# Patient Record
Sex: Female | Born: 1940 | Race: White | Hispanic: No | Smoking: Never smoker
Health system: Southern US, Community
[De-identification: ages and names within clinical notes are randomized; demographics above are authoritative.]

## PROBLEM LIST (undated history)

## (undated) DIAGNOSIS — M549 Dorsalgia, unspecified: Secondary | ICD-10-CM

## (undated) DIAGNOSIS — R609 Edema, unspecified: Secondary | ICD-10-CM

## (undated) DIAGNOSIS — M1711 Unilateral primary osteoarthritis, right knee: Secondary | ICD-10-CM

## (undated) DIAGNOSIS — G629 Polyneuropathy, unspecified: Secondary | ICD-10-CM

## (undated) DIAGNOSIS — E039 Hypothyroidism, unspecified: Secondary | ICD-10-CM

## (undated) DIAGNOSIS — H409 Unspecified glaucoma: Secondary | ICD-10-CM

## (undated) DIAGNOSIS — M1990 Unspecified osteoarthritis, unspecified site: Secondary | ICD-10-CM

## (undated) DIAGNOSIS — F419 Anxiety disorder, unspecified: Secondary | ICD-10-CM

## (undated) DIAGNOSIS — T4145XA Adverse effect of unspecified anesthetic, initial encounter: Secondary | ICD-10-CM

## (undated) DIAGNOSIS — Z8709 Personal history of other diseases of the respiratory system: Secondary | ICD-10-CM

## (undated) DIAGNOSIS — R350 Frequency of micturition: Secondary | ICD-10-CM

## (undated) DIAGNOSIS — G8929 Other chronic pain: Secondary | ICD-10-CM

## (undated) DIAGNOSIS — M2540 Effusion, unspecified joint: Secondary | ICD-10-CM

## (undated) DIAGNOSIS — T8859XA Other complications of anesthesia, initial encounter: Secondary | ICD-10-CM

## (undated) DIAGNOSIS — E785 Hyperlipidemia, unspecified: Secondary | ICD-10-CM

## (undated) DIAGNOSIS — R3915 Urgency of urination: Secondary | ICD-10-CM

## (undated) DIAGNOSIS — M2550 Pain in unspecified joint: Secondary | ICD-10-CM

## (undated) DIAGNOSIS — S065X9A Traumatic subdural hemorrhage with loss of consciousness of unspecified duration, initial encounter: Secondary | ICD-10-CM

## (undated) DIAGNOSIS — K219 Gastro-esophageal reflux disease without esophagitis: Secondary | ICD-10-CM

## (undated) DIAGNOSIS — R600 Localized edema: Secondary | ICD-10-CM

## (undated) DIAGNOSIS — J189 Pneumonia, unspecified organism: Secondary | ICD-10-CM

## (undated) DIAGNOSIS — K8050 Calculus of bile duct without cholangitis or cholecystitis without obstruction: Secondary | ICD-10-CM

## (undated) DIAGNOSIS — Z8614 Personal history of Methicillin resistant Staphylococcus aureus infection: Secondary | ICD-10-CM

## (undated) DIAGNOSIS — I10 Essential (primary) hypertension: Secondary | ICD-10-CM

## (undated) DIAGNOSIS — S065XAA Traumatic subdural hemorrhage with loss of consciousness status unknown, initial encounter: Secondary | ICD-10-CM

## (undated) DIAGNOSIS — R7303 Prediabetes: Secondary | ICD-10-CM

## (undated) DIAGNOSIS — K851 Biliary acute pancreatitis without necrosis or infection: Secondary | ICD-10-CM

## (undated) HISTORY — DX: Unspecified osteoarthritis, unspecified site: M19.90

## (undated) HISTORY — PX: ABDOMINAL HYSTERECTOMY: SHX81

## (undated) HISTORY — DX: Essential (primary) hypertension: I10

## (undated) HISTORY — PX: COLONOSCOPY: SHX174

## (undated) HISTORY — PX: TOTAL HIP ARTHROPLASTY: SHX124

## (undated) HISTORY — DX: Calculus of bile duct without cholangitis or cholecystitis without obstruction: K80.50

## (undated) HISTORY — DX: Traumatic subdural hemorrhage with loss of consciousness of unspecified duration, initial encounter: S06.5X9A

## (undated) HISTORY — DX: Traumatic subdural hemorrhage with loss of consciousness status unknown, initial encounter: S06.5XAA

---

## 2002-03-25 DIAGNOSIS — Z8614 Personal history of Methicillin resistant Staphylococcus aureus infection: Secondary | ICD-10-CM

## 2002-03-25 HISTORY — DX: Personal history of Methicillin resistant Staphylococcus aureus infection: Z86.14

## 2014-09-23 DIAGNOSIS — Z96651 Presence of right artificial knee joint: Secondary | ICD-10-CM | POA: Insufficient documentation

## 2014-12-28 LAB — BASIC METABOLIC PANEL
BUN: 28 mg/dL — AB (ref 4–21)
Creatinine: 1.1 mg/dL (ref 0.5–1.1)
Glucose: 100 mg/dL
Potassium: 5.1 mmol/L (ref 3.4–5.3)
Sodium: 137 mmol/L (ref 137–147)

## 2014-12-28 LAB — CBC AND DIFFERENTIAL
HCT: 40 % (ref 36–46)
Hemoglobin: 12.5 g/dL (ref 12.0–16.0)
Neutrophils Absolute: 4 /uL
Platelets: 322 10*3/uL (ref 150–399)
WBC: 6.3 10^3/mL

## 2014-12-28 LAB — HEPATIC FUNCTION PANEL
ALT: 27 U/L (ref 7–35)
AST: 30 U/L (ref 13–35)
Alkaline Phosphatase: 90 U/L (ref 25–125)
Bilirubin, Total: 0.5 mg/dL

## 2014-12-28 LAB — HEMOGLOBIN A1C: Hgb A1c MFr Bld: 6.8 % — AB (ref 4.0–6.0)

## 2014-12-28 LAB — TSH: TSH: 1.8 u[IU]/mL (ref 0.41–5.90)

## 2015-01-23 ENCOUNTER — Encounter: Payer: Self-pay | Admitting: Internal Medicine

## 2015-01-23 ENCOUNTER — Ambulatory Visit (INDEPENDENT_AMBULATORY_CARE_PROVIDER_SITE_OTHER): Payer: Medicare Other | Admitting: Internal Medicine

## 2015-01-23 ENCOUNTER — Encounter (INDEPENDENT_AMBULATORY_CARE_PROVIDER_SITE_OTHER): Payer: Self-pay

## 2015-01-23 VITALS — BP 98/52 | HR 97 | Temp 98.0°F | Resp 12 | Ht 66.5 in | Wt 297.5 lb

## 2015-01-23 DIAGNOSIS — E559 Vitamin D deficiency, unspecified: Secondary | ICD-10-CM

## 2015-01-23 DIAGNOSIS — Z23 Encounter for immunization: Secondary | ICD-10-CM | POA: Diagnosis not present

## 2015-01-23 DIAGNOSIS — N189 Chronic kidney disease, unspecified: Secondary | ICD-10-CM

## 2015-01-23 DIAGNOSIS — E1122 Type 2 diabetes mellitus with diabetic chronic kidney disease: Secondary | ICD-10-CM | POA: Diagnosis not present

## 2015-01-23 DIAGNOSIS — IMO0001 Reserved for inherently not codable concepts without codable children: Secondary | ICD-10-CM

## 2015-01-23 DIAGNOSIS — M1710 Unilateral primary osteoarthritis, unspecified knee: Secondary | ICD-10-CM | POA: Diagnosis not present

## 2015-01-23 DIAGNOSIS — I129 Hypertensive chronic kidney disease with stage 1 through stage 4 chronic kidney disease, or unspecified chronic kidney disease: Secondary | ICD-10-CM

## 2015-01-23 MED ORDER — ERGOCALCIFEROL 1.25 MG (50000 UT) PO CAPS: 50000.0000 [IU] | ORAL_CAPSULE | ORAL | Status: DC

## 2015-01-23 NOTE — Patient Instructions (Addendum)
Your vitamin D is low, which can increase your risk of weak bones and fractures and interfere with your body's ability to absorb the calcium in your diet.   I am calling in a megadose of Vit D to take once weekly for a total of 3 months,  Then after you finish the weekly supplement, you should start taking an OTC  Vit D3 supplement 1000 units daily.    Check BP at home 3 times over the next week,  I will lower your dose of BP medication  if BP's are < 110/70   When you feel shaky and weak,  This could be a low blood sugar. You need to eat every 3 hours to stimulate your metabolism and prevent low blood sugars  Always combine protein with sugar to prevent low blood sugars.   You can drink glucerna once or twice daily   Pleasle return on or after Jan 8th for diabetes follow up and labs.  I will refill your oxcodone for January in late December

## 2015-01-23 NOTE — Progress Notes (Signed)
Subjective:  Patient ID: Bonnie NatterElizabeth Hinton, female    DOB: 1941-02-12  Age: 74 y.o. MRN: [id number]  CC: The primary encounter diagnosis was Encounter for immunization. 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  HPI Bonnie Hinton presents for establishment of care . She is a 74 yr old white female who has relocated to [place], [place].  Oct 7th to be closer to her daughter, who is  a physical therapist .  She was referred by Josephine IgoKeri Dunckel.  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  She is awaiting right knee which has been postponed to allow her to lose weight.  Thus far she has lost 60 lbs in the past 12 months and goal is  BMI < 40 per  Dr Wyline MoodWeiner.   She enrolled in th  Physician directed weight loss  But the program was too difficult and the caloric restriction to 900 calories daily was causing memory loss. ased memory issues  dfaughtter is supervising. Low carb high  protein diet.  Drinking water when home.   History of DM:  She has been prescribed victoza  For "pre diabetes."  But her most recent  a1c was 6.8 Has had hypoglycemic events per description,  Does not have a glucometer.  statin intolerant.  Taking zetia   Vit D very low .  taking 1000 IUS daily.    was hospitalized in April for hypertensive crisis 230/180.  nost sure which meds she was on prior to hospitalization .  Checks BP at home.  Has been low at home.  Currently taking prinizide .  takes celebrex once or twice daily  For joint pain   Previous MD prescribed gabapentin and tramadol for breakthrough pain, which caused loss of muscle tone and weakness so stopped.   Oxycodone and oxycontin "I have enough for two months" Using stool softeners and miralax daily to prevent constipation.    History Bonnie Hinton has a past medical  history of Arthritis; Diabetes mellitus without complication (HCC); Hypertension; and Thyroid disease.   She has past surgical history that includes Joint replacement and Abdominal hysterectomy.   Her family history includes Cancer in her brother and father; Diabetes in her maternal aunt.She reports that she has never smoked. She has never used smokeless tobacco. She reports that she does not drink alcohol or use illicit drugs.  No outpatient prescriptions prior to visit.   No facility-administered medications prior to visit.    Review of Systems:  Patient denies headache, fevers, malaise, unintentional weight loss, skin rash, eye pain, sinus congestion and sinus pain, sore throat, dysphagia,  hemoptysis , cough, dyspnea, wheezing, chest pain, palpitations, orthopnea, edema, abdominal pain, nausea, melena, diarrhea, constipation, flank pain, dysuria, hematuria, urinary  Frequency, nocturia, numbness, tingling, seizures,  Focal weakness, Loss of consciousness,  Tremor, insomnia, depression, anxiety, and suicidal ideation.     Objective:  BP 98/52 mmHg  Pulse 97  Temp(Src) 98 F (36.7 C) (Oral)  Resp 12  Ht 5' 6.5" (1.689 m)  Wt 297 lb 8 oz (134.945 kg)  BMI 47.30 kg/m2  SpO2 97%  Physical Exam:  General appearance: morbidly obese, alert , cooperative and appears stated age Ears: normal TM's and external ear canals both ears Throat: lips, mucosa, and tongue normal; teeth and gums normal Neck: no  adenopathy, no carotid bruit, supple, symmetrical, trachea midline and thyroid not enlarged, symmetric, no tenderness/mass/nodules Back: symmetric, no curvature. ROM normal. No CVA tenderness. Lungs: clear to auscultation bilaterally Heart: regular rate and rhythm, S1, S2 normal, no murmur, click, rub or gallop Abdomen: soft, non-tender; bowel sounds normal; no masses,  no organomegaly Pulses: 2+ and symmetric Skin: Skin color, texture, turgor normal. No rashes or lesions Lymph nodes:  Cervical, supraclavicular, and axillary nodes normal.  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 She is taking and ACE Inhibitor,  Zetia and a low dose aspirin.  Continue Victoza, return in 3 months.  Advised to add protein shakes in the am to prevent hypoglycemia.   No results found for: HGBA1C       Relevant Medications   aspirin EC 81 MG tablet   ezetimibe (ZETIA) 10 MG tablet   furosemide (LASIX) 20 MG tablet   Liraglutide 18 MG/3ML SOPN   lisinopril-hydrochlorothiazide (PRINZIDE,ZESTORETIC) 20-25 MG tablet   Morbid obesity (HCC)    I have congratulated her in reduction of   BMI and encouraged  Continued weight loss with goal of 10% of body weigh over the next 6 months using a low glycemic index diet and regular exercise a minimum of 5 days per week.        Relevant Medications   Liraglutide 18 MG/3ML SOPN   Vitamin D deficiency    Megadose prescribed for level < 12       Other Visit Diagnoses    Encounter for immunization    -  Primary     A total of 45 minutes was spent with patient more than half of which was spent in counseling patient on the above mentioned issues , reviewing and explaining recent labs and imaging studies done, and coordination of care.   I am having Bonnie Hinton start on ergocalciferol. I am also having her maintain her aspirin EC, b complex vitamins, celecoxib, cycloSPORINE, diclofenac sodium, estradiol, ezetimibe, fluticasone, furosemide, levothyroxine, Liraglutide, Omega-3, oxyCODONE, oxyCODONE-acetaminophen, pantoprazole, lisinopril-hydrochlorothiazide, and tolterodine.  Meds ordered this encounter    Medications  . aspirin EC 81 MG tablet    Sig: Take 81 mg by mouth.  Marland Kitchen b complex vitamins capsule    Sig: Take by mouth.  . celecoxib (CELEBREX) 200 MG capsule    Sig: Take 200 mg by mouth 2 (two) times daily.   . cycloSPORINE (RESTASIS) 0.05 % ophthalmic emulsion    Sig: 1 drop Two (2) times a day.  . diclofenac sodium (VOLTAREN) 1 % GEL    Sig: Apply 2 g topically 4 (four) times daily.   Marland Kitchen estradiol (ESTRACE) 1 MG tablet    Sig: Take 1 mg by mouth daily.   Marland Kitchen ezetimibe (ZETIA) 10 MG tablet    Sig: Take 10 mg by mouth.  . fluticasone (FLONASE) 50 MCG/ACT nasal spray    Sig: 1 spray by Each Nare route daily.  . furosemide (LASIX) 20 MG tablet    Sig: Take 20 mg by mouth daily as needed.   Marland Kitchen levothyroxine (SYNTHROID, LEVOTHROID) 200 MCG tablet  Sig: Take by mouth daily before breakfast.   . Liraglutide 18 MG/3ML SOPN    Sig: Inject 0.6 mg into the skin daily.   . Omega-3 1000 MG CAPS    Sig: Take 1 capsule by mouth daily.   Marland Kitchen oxyCODONE 30 MG 12 hr tablet    Sig: Take 1 tablet by mouth daily.   Marland Kitchen oxyCODONE-acetaminophen (PERCOCET) 10-325 MG tablet    Sig: Take 1 tablet by mouth every 4 (four) hours as needed.   . pantoprazole (PROTONIX) 40 MG tablet    Sig: Take 40 mg by mouth.  Marland Kitchen lisinopril-hydrochlorothiazide (PRINZIDE,ZESTORETIC) 20-25 MG tablet    Sig: Take 1 tablet by mouth daily.  Marland Kitchen tolterodine (DETROL LA) 4 MG 24 hr capsule    Sig: Take 4 mg by mouth daily.  . ergocalciferol (DRISDOL) 50000 UNITS capsule    Sig: Take 1 capsule (50,000 Units total) by mouth once a week.    Dispense:  12 capsule    Refill:  0    With a meal    There are no discontinued medications.  Follow-up: Return in about 3 months (around 04/25/2015) for follow up diabetes.   Sherlene Shams, MD

## 2015-01-23 NOTE — Progress Notes (Signed)
Pre-visit discussion using our clinic review tool. No additional management support is needed unless otherwise documented below in the visit note.  

## 2015-01-24 DIAGNOSIS — E1121 Type 2 diabetes mellitus with diabetic nephropathy: Secondary | ICD-10-CM | POA: Insufficient documentation

## 2015-01-24 DIAGNOSIS — E559 Vitamin D deficiency, unspecified: Secondary | ICD-10-CM | POA: Insufficient documentation

## 2015-01-24 NOTE — Assessment & Plan Note (Signed)
Megadose prescribed for level < 12

## 2015-01-24 NOTE — Assessment & Plan Note (Addendum)
review of recent outside labs confirms diagnosis with a1c of 6.8 . She is taking and ACE Inhibitor,  Zetia and a low dose aspirin.  Continue Victoza, return in 3 months.  Advised to add protein shakes in the am to prevent hypoglycemia.   No results found for: HGBA1C

## 2015-01-24 NOTE — Assessment & Plan Note (Signed)
I have congratulated her in reduction of   BMI and encouraged  Continued weight loss with goal of 10% of body weigh over the next 6 months using a low glycemic index diet and regular exercise a minimum of 5 days per week.    

## 2015-01-25 ENCOUNTER — Encounter: Payer: Self-pay | Admitting: Internal Medicine

## 2015-03-09 ENCOUNTER — Telehealth: Payer: Self-pay | Admitting: Internal Medicine

## 2015-03-09 ENCOUNTER — Other Ambulatory Visit: Payer: Self-pay | Admitting: Internal Medicine

## 2015-03-09 NOTE — Telephone Encounter (Signed)
Refill sent to Dr.Tullo to approve.

## 2015-03-09 NOTE — Telephone Encounter (Signed)
Pt would like refills as soon possible foxyCODONE 30 MG 12 hr tablet and  oxyCODONE-acetaminophen (PERCOCET) 10-325 MG tablet. Please contact to pt about this.  Thank you/

## 2015-03-09 NOTE — Telephone Encounter (Signed)
Patient would like a refill as soon as possible.

## 2015-03-09 NOTE — Telephone Encounter (Signed)
Please advise 

## 2015-03-10 MED ORDER — OXYCODONE HCL ER 30 MG PO T12A: 1.0000 | EXTENDED_RELEASE_TABLET | Freq: Every day | ORAL | Status: DC

## 2015-03-10 MED ORDER — OXYCODONE-ACETAMINOPHEN 10-325 MG PO TABS: 1.0000 | ORAL_TABLET | ORAL | Status: DC | PRN

## 2015-03-10 NOTE — Telephone Encounter (Signed)
Patient notified that Rx was approved and ready for pickup.

## 2015-03-10 NOTE — Telephone Encounter (Signed)
Ok to refill,  printed rx 's

## 2015-03-13 ENCOUNTER — Telehealth: Payer: Self-pay | Admitting: Internal Medicine

## 2015-03-13 MED ORDER — OXYCODONE-ACETAMINOPHEN 10-325 MG PO TABS: 1.0000 | ORAL_TABLET | ORAL | Status: DC | PRN

## 2015-03-13 NOTE — Telephone Encounter (Signed)
Attempted to contact patient (phone line kept ringing). Will try again later

## 2015-03-13 NOTE — Telephone Encounter (Signed)
Correction she can fill the new rx for #120 on the 23rd if she had the other one filled on the 16th

## 2015-03-13 NOTE — Telephone Encounter (Signed)
Pt called in on the afternoon of 12/19 to cancel request. She had another doctor sign papers.  Thank you

## 2015-03-13 NOTE — Telephone Encounter (Signed)
New rx written. She is a new patient to me. I was not aware of how often she was using the short acting percocet .  If she has already filled the previous one,  She will need to use it for the first week and can pick the #120 rx up non Dec 26th

## 2015-03-13 NOTE — Telephone Encounter (Signed)
Script reads every QID PRN  Dispense 30 pleae advise should have been 120?

## 2015-03-13 NOTE — Telephone Encounter (Signed)
Pt dropped off paper work for Dr. Darrick Huntsmanullo to sign for pt's mail box to be moved to front door.  Thank you

## 2015-03-13 NOTE — Telephone Encounter (Signed)
Pt called, she picked up her two prescriptions.  The oxyCODONE-acetaminophen (PERCOCET) 10-325 MG tablet is incorrect. Please call pt.  Thank you

## 2015-03-13 NOTE — Telephone Encounter (Signed)
Patient called stating she only received 30 tablets of her Percocet and she was needing 130. Please advise.

## 2015-03-14 NOTE — Telephone Encounter (Signed)
Pt called back to check on the prescription oxyCODONE-acetaminophen (PERCOCET) 10-325 MG tablet. Pt would like to pick it up today. Pt daughter has to be back at work at 1:00p. Thank you!

## 2015-03-14 NOTE — Telephone Encounter (Signed)
Please call pt on [phone]. Thank you!

## 2015-03-14 NOTE — Telephone Encounter (Signed)
Spoke with the patient, she will have her daughter come to pick up the prescription today before 1pm.  Verbalized understanding that it can't be filled until the 23rd.

## 2015-04-03 ENCOUNTER — Other Ambulatory Visit: Payer: Medicare Other

## 2015-04-11 ENCOUNTER — Telehealth: Payer: Self-pay | Admitting: *Deleted

## 2015-04-11 ENCOUNTER — Other Ambulatory Visit (INDEPENDENT_AMBULATORY_CARE_PROVIDER_SITE_OTHER): Payer: Medicare Other

## 2015-04-11 ENCOUNTER — Telehealth: Payer: Self-pay | Admitting: Internal Medicine

## 2015-04-11 DIAGNOSIS — I129 Hypertensive chronic kidney disease with stage 1 through stage 4 chronic kidney disease, or unspecified chronic kidney disease: Principal | ICD-10-CM

## 2015-04-11 DIAGNOSIS — E1122 Type 2 diabetes mellitus with diabetic chronic kidney disease: Secondary | ICD-10-CM | POA: Diagnosis not present

## 2015-04-11 DIAGNOSIS — E034 Atrophy of thyroid (acquired): Secondary | ICD-10-CM | POA: Diagnosis not present

## 2015-04-11 DIAGNOSIS — N189 Chronic kidney disease, unspecified: Secondary | ICD-10-CM

## 2015-04-11 DIAGNOSIS — E559 Vitamin D deficiency, unspecified: Secondary | ICD-10-CM | POA: Diagnosis not present

## 2015-04-11 DIAGNOSIS — IMO0001 Reserved for inherently not codable concepts without codable children: Secondary | ICD-10-CM

## 2015-04-11 DIAGNOSIS — E038 Other specified hypothyroidism: Secondary | ICD-10-CM | POA: Diagnosis not present

## 2015-04-11 DIAGNOSIS — E875 Hyperkalemia: Secondary | ICD-10-CM

## 2015-04-11 LAB — COMPREHENSIVE METABOLIC PANEL
ALT: 83 U/L — ABNORMAL HIGH (ref 0–35)
AST: 24 U/L (ref 0–37)
Albumin: 4.1 g/dL (ref 3.5–5.2)
Alkaline Phosphatase: 251 U/L — ABNORMAL HIGH (ref 39–117)
BUN: 42 mg/dL — ABNORMAL HIGH (ref 6–23)
CO2: 28 mEq/L (ref 19–32)
Calcium: 9.5 mg/dL (ref 8.4–10.5)
Chloride: 103 mEq/L (ref 96–112)
Creatinine, Ser: 1.29 mg/dL — ABNORMAL HIGH (ref 0.40–1.20)
GFR: 42.91 mL/min — ABNORMAL LOW (ref >60.00)
Glucose, Bld: 108 mg/dL — ABNORMAL HIGH (ref 70–99)
Potassium: 5.5 mEq/L — ABNORMAL HIGH (ref 3.5–5.1)
Sodium: 139 mEq/L (ref 135–145)
Total Bilirubin: 0.5 mg/dL (ref 0.2–1.2)
Total Protein: 7.7 g/dL (ref 6.0–8.3)

## 2015-04-11 LAB — TSH: TSH: 1.53 u[IU]/mL (ref 0.35–4.50)

## 2015-04-11 LAB — LIPID PANEL
Cholesterol: 133 mg/dL (ref 0–200)
HDL: 44.4 mg/dL (ref >39.00)
LDL Cholesterol: 54 mg/dL (ref 0–99)
NonHDL: 88.8
Total CHOL/HDL Ratio: 3
Triglycerides: 174 mg/dL — ABNORMAL HIGH (ref 0.0–149.0)
VLDL: 34.8 mg/dL (ref 0.0–40.0)

## 2015-04-11 LAB — HEMOGLOBIN A1C: Hgb A1c MFr Bld: 6 % (ref 4.6–6.5)

## 2015-04-11 LAB — VITAMIN D 25 HYDROXY (VIT D DEFICIENCY, FRACTURES): VITD: 20.58 ng/mL — ABNORMAL LOW (ref 30.00–100.00)

## 2015-04-11 NOTE — Telephone Encounter (Signed)
Labs and dx?  

## 2015-04-11 NOTE — Telephone Encounter (Signed)
Pt came into office and dropped off a note with a envelope. She is requesting a written letter from Dr. Darrick Huntsman saying that her mail box needs to be left on house.

## 2015-04-12 NOTE — Telephone Encounter (Signed)
Bonnie Hinton put the information that the pt left for you to fill out is in the red folder./tvw

## 2015-04-12 NOTE — Telephone Encounter (Signed)
Where is the note?

## 2015-04-13 NOTE — Telephone Encounter (Signed)
Letter has been drafted ,  Please print if not on printer already

## 2015-04-13 NOTE — Telephone Encounter (Signed)
Letter mailed to patient as requested.

## 2015-04-13 NOTE — Telephone Encounter (Signed)
Placed in quick sign. 

## 2015-04-15 DIAGNOSIS — E875 Hyperkalemia: Secondary | ICD-10-CM | POA: Insufficient documentation

## 2015-04-15 MED ORDER — ERGOCALCIFEROL 1.25 MG (50000 UT) PO CAPS: 50000.0000 [IU] | ORAL_CAPSULE | ORAL | Status: DC

## 2015-04-15 NOTE — Addendum Note (Signed)
Addended by: Sherlene Shams on: 04/15/2015 03:56 PM   Modules accepted: Orders

## 2015-04-18 ENCOUNTER — Other Ambulatory Visit (INDEPENDENT_AMBULATORY_CARE_PROVIDER_SITE_OTHER): Payer: Medicare Other

## 2015-04-18 ENCOUNTER — Telehealth: Payer: Self-pay | Admitting: Internal Medicine

## 2015-04-18 DIAGNOSIS — E875 Hyperkalemia: Secondary | ICD-10-CM

## 2015-04-18 LAB — RENAL FUNCTION PANEL
Albumin: 4.1 g/dL (ref 3.5–5.2)
BUN: 41 mg/dL — ABNORMAL HIGH (ref 6–23)
CO2: 26 mEq/L (ref 19–32)
Calcium: 9.1 mg/dL (ref 8.4–10.5)
Chloride: 99 mEq/L (ref 96–112)
Creatinine, Ser: 1.33 mg/dL — ABNORMAL HIGH (ref 0.40–1.20)
GFR: 41.42 mL/min — ABNORMAL LOW (ref >60.00)
Glucose, Bld: 112 mg/dL — ABNORMAL HIGH (ref 70–99)
Phosphorus: 3.9 mg/dL (ref 2.3–4.6)
Potassium: 4.6 mEq/L (ref 3.5–5.1)
Sodium: 135 mEq/L (ref 135–145)

## 2015-04-18 NOTE — Telephone Encounter (Signed)
Ok   Done  Thank you

## 2015-04-18 NOTE — Telephone Encounter (Signed)
I fMS. Avakian returns call please transfer to triage or me so lab reults can be read to patient.

## 2015-04-18 NOTE — Telephone Encounter (Signed)
Pt daughter called returning your call. Daughter states to call pt. Call pt @ [phone]. Thank you!

## 2015-04-25 ENCOUNTER — Ambulatory Visit (INDEPENDENT_AMBULATORY_CARE_PROVIDER_SITE_OTHER): Payer: Medicare Other | Admitting: Internal Medicine

## 2015-04-25 ENCOUNTER — Encounter: Payer: Self-pay | Admitting: Internal Medicine

## 2015-04-25 VITALS — BP 124/60 | HR 94 | Temp 97.8°F | Resp 14 | Ht 67.0 in | Wt 292.0 lb

## 2015-04-25 DIAGNOSIS — IMO0001 Reserved for inherently not codable concepts without codable children: Secondary | ICD-10-CM

## 2015-04-25 DIAGNOSIS — N189 Chronic kidney disease, unspecified: Secondary | ICD-10-CM

## 2015-04-25 DIAGNOSIS — Z79899 Other long term (current) drug therapy: Secondary | ICD-10-CM | POA: Diagnosis not present

## 2015-04-25 DIAGNOSIS — I129 Hypertensive chronic kidney disease with stage 1 through stage 4 chronic kidney disease, or unspecified chronic kidney disease: Secondary | ICD-10-CM

## 2015-04-25 DIAGNOSIS — E119 Type 2 diabetes mellitus without complications: Secondary | ICD-10-CM | POA: Diagnosis not present

## 2015-04-25 DIAGNOSIS — E875 Hyperkalemia: Secondary | ICD-10-CM

## 2015-04-25 DIAGNOSIS — E1122 Type 2 diabetes mellitus with diabetic chronic kidney disease: Secondary | ICD-10-CM

## 2015-04-25 DIAGNOSIS — L853 Xerosis cutis: Secondary | ICD-10-CM

## 2015-04-25 DIAGNOSIS — M1711 Unilateral primary osteoarthritis, right knee: Secondary | ICD-10-CM

## 2015-04-25 DIAGNOSIS — Z23 Encounter for immunization: Secondary | ICD-10-CM

## 2015-04-25 DIAGNOSIS — R350 Frequency of micturition: Secondary | ICD-10-CM

## 2015-04-25 DIAGNOSIS — R3915 Urgency of urination: Secondary | ICD-10-CM

## 2015-04-25 LAB — POCT URINALYSIS DIPSTICK
Bilirubin, UA: NEGATIVE
Glucose, UA: NEGATIVE
Leukocytes, UA: NEGATIVE
Nitrite, UA: NEGATIVE
Protein, UA: NEGATIVE
Spec Grav, UA: 1.025
Urobilinogen, UA: 0.2
pH, UA: 5

## 2015-04-25 LAB — COMPREHENSIVE METABOLIC PANEL
ALT: 25 U/L (ref 0–35)
AST: 27 U/L (ref 0–37)
Albumin: 4.1 g/dL (ref 3.5–5.2)
Alkaline Phosphatase: 93 U/L (ref 39–117)
BUN: 34 mg/dL — ABNORMAL HIGH (ref 6–23)
CO2: 29 mEq/L (ref 19–32)
Calcium: 9.4 mg/dL (ref 8.4–10.5)
Chloride: 100 mEq/L (ref 96–112)
Creatinine, Ser: 1.1 mg/dL (ref 0.40–1.20)
GFR: 51.56 mL/min — ABNORMAL LOW (ref >60.00)
Glucose, Bld: 107 mg/dL — ABNORMAL HIGH (ref 70–99)
Potassium: 5.2 mEq/L — ABNORMAL HIGH (ref 3.5–5.1)
Sodium: 136 mEq/L (ref 135–145)
Total Bilirubin: 0.4 mg/dL (ref 0.2–1.2)
Total Protein: 7.5 g/dL (ref 6.0–8.3)

## 2015-04-25 LAB — URINALYSIS, ROUTINE W REFLEX MICROSCOPIC
Bilirubin Urine: NEGATIVE
Hgb urine dipstick: NEGATIVE
Ketones, ur: NEGATIVE
Leukocytes, UA: NEGATIVE
Nitrite: NEGATIVE
Specific Gravity, Urine: 1.025 (ref 1.000–1.030)
Total Protein, Urine: NEGATIVE
Urine Glucose: NEGATIVE
Urobilinogen, UA: 0.2 (ref 0.0–1.0)
pH: 5.5 (ref 5.0–8.0)

## 2015-04-25 LAB — MICROALBUMIN / CREATININE URINE RATIO
Creatinine,U: 138.4 mg/dL
Microalb Creat Ratio: 0.8 mg/g (ref 0.0–30.0)
Microalb, Ur: 1.1 mg/dL (ref 0.0–1.9)

## 2015-04-25 MED ORDER — OXYCODONE HCL ER 30 MG PO T12A: 1.0000 | EXTENDED_RELEASE_TABLET | Freq: Every day | ORAL | Status: DC

## 2015-04-25 MED ORDER — ROSUVASTATIN CALCIUM 20 MG PO TABS: 20.0000 mg | ORAL_TABLET | Freq: Every day | ORAL | Status: DC

## 2015-04-25 MED ORDER — TOLTERODINE TARTRATE ER 4 MG PO CP24: 4.0000 mg | ORAL_CAPSULE | Freq: Two times a day (BID) | ORAL | Status: DC

## 2015-04-25 MED ORDER — EZETIMIBE 10 MG PO TABS: 10.0000 mg | ORAL_TABLET | Freq: Every day | ORAL | Status: DC

## 2015-04-25 MED ORDER — LEVOTHYROXINE SODIUM 200 MCG PO TABS: 200.0000 ug | ORAL_TABLET | Freq: Every day | ORAL | Status: DC

## 2015-04-25 MED ORDER — OXYCODONE-ACETAMINOPHEN 10-325 MG PO TABS: 1.0000 | ORAL_TABLET | ORAL | Status: DC | PRN

## 2015-04-25 MED ORDER — PANTOPRAZOLE SODIUM 40 MG PO TBEC: 40.0000 mg | DELAYED_RELEASE_TABLET | Freq: Every day | ORAL | Status: DC

## 2015-04-25 MED ORDER — ESTRADIOL 1 MG PO TABS: 1.0000 mg | ORAL_TABLET | Freq: Every day | ORAL | Status: DC

## 2015-04-25 NOTE — Progress Notes (Signed)
Pre-visit discussion using our clinic review tool. No additional management support is needed unless otherwise documented below in the visit note.  

## 2015-04-25 NOTE — Progress Notes (Signed)
Subjective:  Patient ID: Bonnie Hinton, female    DOB: Sep 29, 1940  Age: 75 y.o. MRN: [id number]  CC: The primary encounter diagnosis was Long-term use of high-risk medication. Diagnoses of Frequency of urination, Diabetes mellitus without complication ([place]), Need for prophylactic vaccination against Streptococcus pneumoniae (pneumococcus), Type 2 DM with CKD and hypertension ([place]), Morbid obesity, unspecified obesity type ([place]), Primary osteoarthritis of right knee, Urinary urgency, Xerosis of skin, and Hyperkalemia were also pertinent to this visit.  HPI Tanyia Grabbe presents for follow up on severe DJD knees bilaterally , complicated by morbid obesity and type 2 DM with CKD and hypertension . She is having increased difficulty ambulating due to pain she lives alone , with a daughter in the near vicinity who is a physical therapist.  Has not had any  Falls since the move from [place].  Her last fall was due to orthostasis from medication  Previously prescribed.    Obesity:  She has lost 5 lbs since last visit. 70 lbs thus far   287 at home . dtr is picking the orthopedist once she has been able   3 month re fills needed on opioids.  Needs a new knee brace, bc it has become loose   Has been having urinary urge incontinence previously managed with Detrol LA 4 mg once daily,  Needs it to be changed to twice daily    Had a self described  allergic reaction itching all over lasting several days,  Daughter put her on Benadryl and this has helped.  No rash just itching to the point of bleeding.  Thinks it may be neurotic itching due to stress following the loss of husband 4 years ago and has never recovered.    Taking victoza for diabetes management.  Blood sugars  < 150 both fasting and post prandially.    Outpatient Prescriptions Prior to Visit  Medication Sig Dispense Refill  . aspirin EC 81 MG tablet Take 81 mg by mouth.    Marland Kitchen b complex vitamins capsule Take by mouth.    .  celecoxib (CELEBREX) 200 MG capsule Take 200 mg by mouth 2 (two) times daily.     . cycloSPORINE (RESTASIS) 0.05 % ophthalmic emulsion 1 drop Two (2) times a day.    . diclofenac sodium (VOLTAREN) 1 % GEL Apply 2 g topically 4 (four) times daily.     . ergocalciferol (DRISDOL) 50000 units capsule Take 1 capsule (50,000 Units total) by mouth once a week. 12 capsule 0  . fluticasone (FLONASE) 50 MCG/ACT nasal spray 1 spray by Each Nare route daily.    . furosemide (LASIX) 20 MG tablet Take 20 mg by mouth daily as needed.     . Liraglutide 18 MG/3ML SOPN Inject 0.6 mg into the skin daily.     Marland Kitchen lisinopril-hydrochlorothiazide (PRINZIDE,ZESTORETIC) 20-25 MG tablet Take 1 tablet by mouth daily.    . Omega-3 1000 MG CAPS Take 1 capsule by mouth daily.     Marland Kitchen estradiol (ESTRACE) 1 MG tablet Take 1 mg by mouth daily.     Marland Kitchen ezetimibe (ZETIA) 10 MG tablet Take 10 mg by mouth.    . levothyroxine (SYNTHROID, LEVOTHROID) 200 MCG tablet Take by mouth daily before breakfast.     . oxyCODONE 30 MG 12 hr tablet Take 1 tablet by mouth daily. 60 each 0  . oxyCODONE-acetaminophen (PERCOCET) 10-325 MG tablet Take 1 tablet by mouth every 4 (four) hours as needed. 120 tablet 0  . pantoprazole (PROTONIX) 40  MG tablet Take 40 mg by mouth.    . tolterodine (DETROL LA) 4 MG 24 hr capsule Take 4 mg by mouth 2 (two) times daily.     No facility-administered medications prior to visit.    Review of Systems;  Patient denies headache, fevers, malaise, unintentional weight loss, skin rash, eye pain, sinus congestion and sinus pain, sore throat, dysphagia,  hemoptysis , cough, dyspnea, wheezing, chest pain, palpitations, orthopnea, edema, abdominal pain, nausea, melena, diarrhea, constipation, flank pain, dysuria, hematuria, urinary  Frequency, nocturia, numbness, tingling, seizures,  Focal weakness, Loss of consciousness,  Tremor, insomnia, depression, anxiety, and suicidal ideation.      Objective:  BP 124/60 mmHg  Pulse  94  Temp(Src) 97.8 F (36.6 C) (Oral)  Resp 14  Ht _0  (1.702 m)  Wt 292 lb (132.45 kg)  BMI 45.72 kg/m2  SpO2 96%  BP Readings from Last 3 Encounters:  04/25/15 124/60  01/23/15 98/52    Wt Readings from Last 3 Encounters:  04/25/15 292 lb (132.45 kg)  01/23/15 297 lb 8 oz (134.945 kg)    General appearance: alert, cooperative and appears stated age Ears: normal TM's and external ear canals both ears Throat: lips, mucosa, and tongue normal; teeth and gums normal Neck: no adenopathy, no carotid bruit, supple, symmetrical, trachea midline and thyroid not enlarged, symmetric, no tenderness/mass/nodules Back: symmetric, no curvature. ROM normal. No CVA tenderness. Lungs: clear to auscultation bilaterally Heart: regular rate and rhythm, S1, S2 normal, no murmur, click, rub or gallop Abdomen: soft, non-tender; bowel sounds normal; no masses,  no organomegaly Pulses: 2+ and symmetric Skin: Skin color, texture, turgor normal. No rashes or lesions Lymph nodes: Cervical, supraclavicular, and axillary nodes normal.  Lab Results  Component Value Date   HGBA1C 6.0 04/11/2015   HGBA1C 6.8* 12/28/2014    Lab Results  Component Value Date   CREATININE 1.10 04/25/2015   CREATININE 1.33* 04/18/2015   CREATININE 1.29* 04/11/2015    Lab Results  Component Value Date   WBC 6.3 12/28/2014   HGB 12.5 12/28/2014   HCT 40 12/28/2014   PLT 322 12/28/2014   GLUCOSE 107* 04/25/2015   CHOL 133 04/11/2015   TRIG 174.0* 04/11/2015   HDL 44.40 04/11/2015   LDLCALC 54 04/11/2015   ALT 25 04/25/2015   AST 27 04/25/2015   NA 136 04/25/2015   K 5.2* 04/25/2015   CL 100 04/25/2015   CREATININE 1.10 04/25/2015   BUN 34* 04/25/2015   CO2 29 04/25/2015   TSH 1.53 04/11/2015   HGBA1C 6.0 04/11/2015   MICROALBUR 1.1 04/25/2015    Patient was never admitted.  Assessment & Plan:   Problem List Items Addressed This Visit    Arthritis of knee, degenerative    Managed with Celebrex and  opioids,  Suspending Celebrex given increase in cr.       Relevant Medications   oxyCODONE 30 MG 12 hr tablet   oxyCODONE-acetaminophen (PERCOCET) 10-325 MG tablet   oxyCODONE 30 MG 12 hr tablet   oxyCODONE-acetaminophen (PERCOCET) 10-325 MG tablet   oxyCODONE 30 MG 12 hr tablet   oxyCODONE-acetaminophen (PERCOCET) 10-325 MG tablet   Type 2 DM with CKD and hypertension ([place])    review of recent outside labs confirms diagnosis with a1c of 6.8 . She is taking an ACE Inhibitor,  Zetia and a low dose aspirin.  Continue Victoza, return in 3 months.  Advise to suspend celebrex for a month and repeat an assessment of renal function.  Lab Results  Component Value Date   HGBA1C 6.0 04/11/2015           Relevant Medications   ezetimibe (ZETIA) 10 MG tablet   rosuvastatin (CRESTOR) 20 MG tablet   Morbid obesity ([place])    I have congratulated her in reduction of   BMI and encouraged  Continued weight loss with goal of 10% of body weigh over the next 6 months using a low glycemic index diet and regular exercise a minimum of 5 days per week.        Urinary urgency    Not well controlled on Detrol 4 mg.  increase to 8 mg.       Xerosis of skin    Recommended emollient       Hyperkalemia    Mild, recurrent.  Suspending celebrex and repeat bmet one month       Other Visit Diagnoses    Long-term use of high-risk medication    -  Primary    Relevant Orders    Comp Met (CMET) (Completed)    Frequency of urination        Relevant Orders    POCT urinalysis dipstick (Completed)    Urinalysis, Routine w reflex microscopic (not at [facility]) (Completed)    Diabetes mellitus without complication ([facility])        Relevant Medications    rosuvastatin (CRESTOR) 20 MG tablet    Other Relevant Orders    Microalbumin / creatinine urine ratio (Completed)    Need for prophylactic vaccination against Streptococcus pneumoniae (pneumococcus)        Relevant Orders    Pneumococcal conjugate vaccine  13-valent (Completed)      A total of 40 minutes of face to face time was spent with patient more than half of which was spent in counselling and coordination of care   I have changed Ms. Moyano's estradiol, levothyroxine, ezetimibe, and pantoprazole. I am also having her start on rosuvastatin. Additionally, I am having her maintain her aspirin EC, b complex vitamins, celecoxib, cycloSPORINE, diclofenac sodium, fluticasone, furosemide, Liraglutide, Omega-3, lisinopril-hydrochlorothiazide, ergocalciferol, oxyCODONE, oxyCODONE-acetaminophen, oxyCODONE, oxyCODONE-acetaminophen, oxyCODONE, oxyCODONE-acetaminophen, and tolterodine.  Meds ordered this encounter  Medications  . DISCONTD: oxyCODONE 30 MG 12 hr tablet    Sig: Take 1 tablet by mouth daily.    Dispense:  60 each    Refill:  0  . DISCONTD: oxyCODONE-acetaminophen (PERCOCET) 10-325 MG tablet    Sig: Take 1 tablet by mouth every 4 (four) hours as needed.    Dispense:  180 tablet    Refill:  0  . DISCONTD: oxyCODONE 30 MG 12 hr tablet    Sig: Take 1 tablet by mouth daily.    Dispense:  60 each    Refill:  0    May refill on or after May 24 2015  . DISCONTD: oxyCODONE-acetaminophen (PERCOCET) 10-325 MG tablet    Sig: Take 1 tablet by mouth every 4 (four) hours as needed.    Dispense:  180 tablet    Refill:  0    May refill on or after May 24 2015  . oxyCODONE 30 MG 12 hr tablet    Sig: Take 1 tablet by mouth daily.    Dispense:  60 each    Refill:  0  . oxyCODONE-acetaminophen (PERCOCET) 10-325 MG tablet    Sig: Take 1 tablet by mouth every 4 (four) hours as needed.    Dispense:  180 tablet    Refill:  0  .  oxyCODONE 30 MG 12 hr tablet    Sig: Take 1 tablet by mouth daily.    Dispense:  60 each    Refill:  0    May refill on or after June 22 2015  . oxyCODONE-acetaminophen (PERCOCET) 10-325 MG tablet    Sig: Take 1 tablet by mouth every 4 (four) hours as needed.    Dispense:  180 tablet    Refill:  0    May refill on  or after June 24 2015  . oxyCODONE 30 MG 12 hr tablet    Sig: Take 1 tablet by mouth daily.    Dispense:  60 each    Refill:  0    May refill on or after May 24 2015  . oxyCODONE-acetaminophen (PERCOCET) 10-325 MG tablet    Sig: Take 1 tablet by mouth every 4 (four) hours as needed.    Dispense:  180 tablet    Refill:  0    May refill on or after May 24 2015  . DISCONTD: tolterodine (DETROL LA) 4 MG 24 hr capsule    Sig: Take 1 capsule (4 mg total) by mouth 2 (two) times daily.    Dispense:  180 capsule    Refill:  1  . estradiol (ESTRACE) 1 MG tablet    Sig: Take 1 tablet (1 mg total) by mouth daily.    Dispense:  90 tablet    Refill:  3  . levothyroxine (SYNTHROID, LEVOTHROID) 200 MCG tablet    Sig: Take 1 tablet (200 mcg total) by mouth daily before breakfast.    Dispense:  90 tablet    Refill:  1  . ezetimibe (ZETIA) 10 MG tablet    Sig: Take 1 tablet (10 mg total) by mouth daily.    Dispense:  90 tablet    Refill:  3  . tolterodine (DETROL LA) 4 MG 24 hr capsule    Sig: Take 1 capsule (4 mg total) by mouth 2 (two) times daily.    Dispense:  180 capsule    Refill:  1  . pantoprazole (PROTONIX) 40 MG tablet    Sig: Take 1 tablet (40 mg total) by mouth daily.    Dispense:  90 tablet    Refill:  3  . rosuvastatin (CRESTOR) 20 MG tablet    Sig: Take 1 tablet (20 mg total) by mouth daily.    Dispense:  90 tablet    Refill:  1    Medications Discontinued During This Encounter  Medication Reason  . oxyCODONE 30 MG 12 hr tablet Reorder  . oxyCODONE-acetaminophen (PERCOCET) 10-325 MG tablet Reorder  . oxyCODONE 30 MG 12 hr tablet Reorder  . oxyCODONE-acetaminophen (PERCOCET) 10-325 MG tablet Reorder  . oxyCODONE 30 MG 12 hr tablet Reorder  . oxyCODONE-acetaminophen (PERCOCET) 10-325 MG tablet Reorder  . oxyCODONE 30 MG 12 hr tablet Reorder  . oxyCODONE-acetaminophen (PERCOCET) 10-325 MG tablet Reorder  . oxyCODONE 30 MG 12 hr tablet Reorder  . oxyCODONE-acetaminophen  (PERCOCET) 10-325 MG tablet Reorder  . tolterodine (DETROL LA) 4 MG 24 hr capsule Reorder  . estradiol (ESTRACE) 1 MG tablet Reorder  . levothyroxine (SYNTHROID, LEVOTHROID) 200 MCG tablet Reorder  . ezetimibe (ZETIA) 10 MG tablet Reorder  . tolterodine (DETROL LA) 4 MG 24 hr capsule Reorder  . pantoprazole (PROTONIX) 40 MG tablet Reorder    Follow-up: Return in about 3 months (around 07/23/2015) for follow up diabetes.   Crecencio Mc, MD

## 2015-04-25 NOTE — Patient Instructions (Addendum)
You should try  Eucerin,  Cetaphil , or Aveeno and use daily or twice daily all over body  To prevent xerosis (extremely dry skin)   I am treating you for sinusitis/otitis which is ca complication from your viral infection due to  persistent sinus congestion.  flush your sinuses twice daily with Lloyd Huger med Sinus rinse  (do over the sink because if you do it right you will spit out globs of mucus)  Please stop the celebrex for a month and return for a repeat kidney test.

## 2015-04-26 DIAGNOSIS — L853 Xerosis cutis: Secondary | ICD-10-CM | POA: Insufficient documentation

## 2015-04-26 DIAGNOSIS — N3946 Mixed incontinence: Secondary | ICD-10-CM | POA: Insufficient documentation

## 2015-04-26 DIAGNOSIS — E875 Hyperkalemia: Secondary | ICD-10-CM | POA: Insufficient documentation

## 2015-04-26 NOTE — Assessment & Plan Note (Signed)
Not well controlled on Detrol 4 mg.  increase to 8 mg.

## 2015-04-26 NOTE — Assessment & Plan Note (Signed)
Mild, recurrent.  Suspending celebrex and repeat bmet one month

## 2015-04-26 NOTE — Assessment & Plan Note (Addendum)
review of recent outside labs confirms diagnosis with a1c of 6.8 . She is taking an ACE Inhibitor,  Zetia and a low dose aspirin.  Continue Victoza, return in 3 months.  Advise to suspend celebrex for a month and repeat an assessment of renal function.  Lab Results  Component Value Date   HGBA1C 6.0 04/11/2015

## 2015-04-26 NOTE — Assessment & Plan Note (Signed)
Managed with Celebrex and opioids,  Suspending Celebrex given increase in cr.

## 2015-04-26 NOTE — Assessment & Plan Note (Signed)
I have congratulated her in reduction of   BMI and encouraged  Continued weight loss with goal of 10% of body weigh over the next 6 months using a low glycemic index diet and regular exercise a minimum of 5 days per week.    

## 2015-04-26 NOTE — Assessment & Plan Note (Signed)
Recommended emollient

## 2015-05-02 ENCOUNTER — Other Ambulatory Visit: Payer: Self-pay

## 2015-05-02 ENCOUNTER — Other Ambulatory Visit (INDEPENDENT_AMBULATORY_CARE_PROVIDER_SITE_OTHER): Payer: Medicare Other

## 2015-05-02 ENCOUNTER — Telehealth: Payer: Self-pay | Admitting: Internal Medicine

## 2015-05-02 DIAGNOSIS — E875 Hyperkalemia: Secondary | ICD-10-CM | POA: Diagnosis not present

## 2015-05-02 LAB — BASIC METABOLIC PANEL
BUN: 34 mg/dL — ABNORMAL HIGH (ref 6–23)
CO2: 30 mEq/L (ref 19–32)
Calcium: 9.2 mg/dL (ref 8.4–10.5)
Chloride: 100 mEq/L (ref 96–112)
Creatinine, Ser: 1.24 mg/dL — ABNORMAL HIGH (ref 0.40–1.20)
GFR: 44.9 mL/min — ABNORMAL LOW (ref >60.00)
Glucose, Bld: 107 mg/dL — ABNORMAL HIGH (ref 70–99)
Potassium: 5 mEq/L (ref 3.5–5.1)
Sodium: 135 mEq/L (ref 135–145)

## 2015-05-02 MED ORDER — TOLTERODINE TARTRATE ER 4 MG PO CP24: 4.0000 mg | ORAL_CAPSULE | Freq: Two times a day (BID) | ORAL | Status: DC

## 2015-05-02 MED ORDER — AMLODIPINE BESYLATE 5 MG PO TABS
5.0000 mg | ORAL_TABLET | Freq: Every day | ORAL | Status: DC
Start: 2015-05-02 — End: 2015-12-04

## 2015-05-02 NOTE — Telephone Encounter (Signed)
Please advise as the pharmacy has sent a request that this dose exceeds the limit for detrol of  total daily. Thanks

## 2015-05-02 NOTE — Telephone Encounter (Signed)
Kidney cancer does not cause high potassium..  Please give her a low potassium diet handout and offer her an appointment if she is so distraught

## 2015-05-02 NOTE — Telephone Encounter (Signed)
Patient notified  and voiced understanding. Mailed copy of low potassium foods to patient.

## 2015-05-02 NOTE — Telephone Encounter (Signed)
I am suspending the lisinopril bc it may be contributing to the high potassium .   Will send amlodipine to mail order instead

## 2015-05-02 NOTE — Telephone Encounter (Signed)
Patient in office today for lab draw seen at front desk by nurse patient in tears concerned that the increase in potassium she is having could be due to cancer of the kidney which patient has a strong family HX of. Nurse tried to reassure patient may still be just transient or something as simple as eating to much potassium containing foods . Patient ask nurse to speak to MD of her concerns. Please advise.

## 2015-05-03 ENCOUNTER — Telehealth: Payer: Self-pay | Admitting: Internal Medicine

## 2015-05-03 ENCOUNTER — Other Ambulatory Visit: Payer: Self-pay | Admitting: Internal Medicine

## 2015-05-03 MED ORDER — TOLTERODINE TARTRATE ER 4 MG PO CP24: 4.0000 mg | ORAL_CAPSULE | Freq: Every day | ORAL | Status: DC

## 2015-05-03 NOTE — Telephone Encounter (Signed)
Pt is currently waiting on getting her Detrol approved, a message was sent to Dr. Darrick Huntsman to confirm the dosage and directions. Please advise, thanks

## 2015-05-03 NOTE — Telephone Encounter (Signed)
Please advise as I had sent a prior medication refill for patient's detrol and the pharmacy questioned as the dose exceeds the recommendations.  You approved and I wanted to double check that you are okay with that and I will call the pharmacy to make sure they know your okay with it.  thanks

## 2015-05-03 NOTE — Telephone Encounter (Signed)
I HAVE REASSESSED DOSE , patient requested an increase but it is not a good idea given her kidney clearance.  rx has been resent at 4 mg daily max dose

## 2015-05-03 NOTE — Telephone Encounter (Signed)
[facility]

## 2015-05-06 ENCOUNTER — Other Ambulatory Visit: Payer: Self-pay | Admitting: Internal Medicine

## 2015-05-06 DIAGNOSIS — R944 Abnormal results of kidney function studies: Secondary | ICD-10-CM

## 2015-05-11 ENCOUNTER — Telehealth: Payer: Self-pay | Admitting: Internal Medicine

## 2015-05-11 NOTE — Telephone Encounter (Signed)
PA for Estrace started on cover my meds.

## 2015-05-12 ENCOUNTER — Other Ambulatory Visit (INDEPENDENT_AMBULATORY_CARE_PROVIDER_SITE_OTHER): Payer: Medicare Other

## 2015-05-12 DIAGNOSIS — E875 Hyperkalemia: Secondary | ICD-10-CM

## 2015-05-12 DIAGNOSIS — IMO0001 Reserved for inherently not codable concepts without codable children: Secondary | ICD-10-CM

## 2015-05-12 DIAGNOSIS — R944 Abnormal results of kidney function studies: Secondary | ICD-10-CM

## 2015-05-12 DIAGNOSIS — I129 Hypertensive chronic kidney disease with stage 1 through stage 4 chronic kidney disease, or unspecified chronic kidney disease: Secondary | ICD-10-CM

## 2015-05-12 DIAGNOSIS — E1122 Type 2 diabetes mellitus with diabetic chronic kidney disease: Secondary | ICD-10-CM

## 2015-05-12 LAB — BASIC METABOLIC PANEL
BUN: 22 mg/dL (ref 6–23)
CO2: 32 mEq/L (ref 19–32)
Calcium: 8.7 mg/dL (ref 8.4–10.5)
Chloride: 103 mEq/L (ref 96–112)
Creatinine, Ser: 0.97 mg/dL (ref 0.40–1.20)
GFR: 59.61 mL/min — ABNORMAL LOW (ref >60.00)
Glucose, Bld: 101 mg/dL — ABNORMAL HIGH (ref 70–99)
Potassium: 4.4 mEq/L (ref 3.5–5.1)
Sodium: 141 mEq/L (ref 135–145)

## 2015-05-12 NOTE — Progress Notes (Signed)
Patient here today for lab draw only. 

## 2015-05-14 NOTE — Assessment & Plan Note (Signed)
Resolved since stopping lisinopril

## 2015-05-14 NOTE — Addendum Note (Signed)
Addended by: Sherlene Shams on: 05/14/2015 08:26 AM   Modules accepted: Orders

## 2015-05-16 ENCOUNTER — Telehealth: Payer: Self-pay | Admitting: Internal Medicine

## 2015-05-16 NOTE — Telephone Encounter (Signed)
She can resume the detrol bid.  i will look into alternatives

## 2015-05-16 NOTE — Telephone Encounter (Signed)
Patient called and state since stopping Detrol BID, she is having trouble with incontinence during the day and night, is afraid to go to grocery store for urinating on her self.

## 2015-05-25 ENCOUNTER — Encounter: Payer: Self-pay | Admitting: Internal Medicine

## 2015-05-25 ENCOUNTER — Ambulatory Visit (INDEPENDENT_AMBULATORY_CARE_PROVIDER_SITE_OTHER): Payer: Medicare Other | Admitting: Internal Medicine

## 2015-05-25 VITALS — BP 110/58 | HR 88 | Temp 97.8°F | Resp 12 | Ht 67.0 in | Wt 297.8 lb

## 2015-05-25 DIAGNOSIS — I129 Hypertensive chronic kidney disease with stage 1 through stage 4 chronic kidney disease, or unspecified chronic kidney disease: Secondary | ICD-10-CM

## 2015-05-25 DIAGNOSIS — R3915 Urgency of urination: Secondary | ICD-10-CM

## 2015-05-25 DIAGNOSIS — E875 Hyperkalemia: Secondary | ICD-10-CM

## 2015-05-25 DIAGNOSIS — M179 Osteoarthritis of knee, unspecified: Secondary | ICD-10-CM

## 2015-05-25 DIAGNOSIS — N183 Chronic kidney disease, stage 3 unspecified: Secondary | ICD-10-CM

## 2015-05-25 DIAGNOSIS — M1711 Unilateral primary osteoarthritis, right knee: Secondary | ICD-10-CM

## 2015-05-25 DIAGNOSIS — E1122 Type 2 diabetes mellitus with diabetic chronic kidney disease: Secondary | ICD-10-CM

## 2015-05-25 DIAGNOSIS — IMO0001 Reserved for inherently not codable concepts without codable children: Secondary | ICD-10-CM

## 2015-05-25 DIAGNOSIS — N189 Chronic kidney disease, unspecified: Secondary | ICD-10-CM

## 2015-05-25 LAB — BASIC METABOLIC PANEL
BUN: 20 mg/dL (ref 6–23)
CO2: 29 mEq/L (ref 19–32)
Calcium: 8.8 mg/dL (ref 8.4–10.5)
Chloride: 100 mEq/L (ref 96–112)
Creatinine, Ser: 0.93 mg/dL (ref 0.40–1.20)
GFR: 62.57 mL/min (ref >60.00)
Glucose, Bld: 104 mg/dL — ABNORMAL HIGH (ref 70–99)
Potassium: 4 mEq/L (ref 3.5–5.1)
Sodium: 136 mEq/L (ref 135–145)

## 2015-05-25 MED ORDER — OXYCODONE HCL ER 30 MG PO T12A: 1.0000 | EXTENDED_RELEASE_TABLET | Freq: Every day | ORAL | Status: DC

## 2015-05-25 MED ORDER — OXYCODONE-ACETAMINOPHEN 10-325 MG PO TABS: 1.0000 | ORAL_TABLET | ORAL | Status: DC | PRN

## 2015-05-25 MED ORDER — TOLTERODINE TARTRATE ER 4 MG PO CP24: 4.0000 mg | ORAL_CAPSULE | Freq: Two times a day (BID) | ORAL | Status: DC

## 2015-05-25 NOTE — Patient Instructions (Addendum)
If your kidney function has worsened again, you'll have to stop the celebrex   Ask your daughter to send me the name of the orthopedist so I can refer you for a steroid injection  You may be having low blood sugars because your diabetes is so well controlled.  Make sure you are having a snack at night   Hypoglycemia Hypoglycemia occurs when the glucose in your blood is too low. Glucose is a type of sugar that is your body's main energy source. Hormones, such as insulin and glucagon, control the level of glucose in the blood. Insulin lowers blood glucose and glucagon increases blood glucose. Having too much insulin in your blood stream, or not eating enough food containing sugar, can result in hypoglycemia. Hypoglycemia can happen to people with or without diabetes. It can develop quickly and can be a medical emergency.  CAUSES   Missing or delaying meals.  Not eating enough carbohydrates at meals.  Taking too much diabetes medicine.  Not timing your oral diabetes medicine or insulin doses with meals, snacks, and exercise.  Nausea and vomiting.  Certain medicines.  Severe illnesses, such as hepatitis, kidney disorders, and certain eating disorders.  Increased activity or exercise without eating something extra or adjusting medicines.  Drinking too much alcohol.  A nerve disorder that affects body functions like your heart rate, blood pressure, and digestion (autonomic neuropathy).  A condition where the stomach muscles do not function properly (gastroparesis). Therefore, medicines and food may not absorb properly.  Rarely, a tumor of the pancreas can produce too much insulin. SYMPTOMS   Hunger.  Sweating (diaphoresis).  Change in body temperature.  Shakiness.  Headache.  Anxiety.  Lightheadedness.  Irritability.  Difficulty concentrating.  Dry mouth.  Tingling or numbness in the hands or feet.  Restless sleep or sleep disturbances.  Altered speech and  coordination.  Change in mental status.  Seizures or prolonged convulsions.  Combativeness.  Drowsiness (lethargic).  Weakness.  Increased heart rate or palpitations.  Confusion.  Pale, gray skin color.  Blurred or double vision.  Fainting. DIAGNOSIS  A physical exam and medical history will be performed. Your caregiver may make a diagnosis based on your symptoms. Blood tests and other lab tests may be performed to confirm a diagnosis. Once the diagnosis is made, your caregiver will see if your signs and symptoms go away once your blood glucose is raised.  TREATMENT  Usually, you can easily treat your hypoglycemia when you notice symptoms.  Check your blood glucose. If it is less than 70 mg/dl, take one of the following:   3-4 glucose tablets.    cup juice.    cup regular soda.   1 cup skim milk.   -1 tube of glucose gel.   5-6 hard candies.   Avoid high-fat drinks or food that may delay a rise in blood glucose levels.  Do not take more than the recommended amount of sugary foods, drinks, gel, or tablets. Doing so will cause your blood glucose to go too high.   Wait 10-15 minutes and recheck your blood glucose. If it is still less than 70 mg/dl or below your target range, repeat treatment.   Eat a snack if it is more than 1 hour until your next meal.  There may be a time when your blood glucose may go so low that you are unable to treat yourself at home when you start to notice symptoms. You may need someone to help you. You may even  faint or be unable to swallow. If you cannot treat yourself, someone will need to bring you to the hospital.  HOME CARE INSTRUCTIONS  If you have diabetes, follow your diabetes management plan by:  Taking your medicines as directed.  Following your exercise plan.  Following your meal plan. Do not skip meals. Eat on time.  Testing your blood glucose regularly. Check your blood glucose before and after exercise. If you  exercise longer or different than usual, be sure to check blood glucose more frequently.  Wearing your medical alert jewelry that says you have diabetes.  Identify the cause of your hypoglycemia. Then, develop ways to prevent the recurrence of hypoglycemia.  Do not take a hot bath or shower right after an insulin shot.  Always carry treatment with you. Glucose tablets are the easiest to carry.  If you are going to drink alcohol, drink it only with meals.  Tell friends or family members ways to keep you safe during a seizure. This may include removing hard or sharp objects from the area or turning you on your side.  Maintain a healthy weight. SEEK MEDICAL CARE IF:   You are having problems keeping your blood glucose in your target range.  You are having frequent episodes of hypoglycemia.  You feel you might be having side effects from your medicines.  You are not sure why your blood glucose is dropping so low.  You notice a change in vision or a new problem with your vision. SEEK IMMEDIATE MEDICAL CARE IF:   Confusion develops.  A change in mental status occurs.  The inability to swallow develops.  Fainting occurs.   This information is not intended to replace advice given to you by your health care provider. Make sure you discuss any questions you have with your health care provider.   Document Released: 03/11/2005 Document Revised: 03/16/2013 Document Reviewed: 11/15/2014 Elsevier Interactive Patient Education [employer]

## 2015-05-25 NOTE — Progress Notes (Signed)
Subjective:  Patient ID: Bonnie Hinton, female    DOB: [birth year]  Age: 75 y.o. MRN: [id number]  CC: The primary encounter diagnosis was CKD (chronic kidney disease) stage 3, GFR 30-59 ml/min. 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  HPI Bonnie Hinton presents for follow up on CK, D hypertension,  Urinary incontinence,  DJD with morbid obesity and Type 2 DM.  Last seen  Jan 31.  Has gained 5 lbs.     Wetting pajamas 3/night.  Prior urology evaluation occurred before her move ,  Kegel exercises.did not help and her urologist suggested use of detrol am and pm  She continues to suffer from Knee pain right due to DJD.  She failed synvisc x 3 injections.  Last steroid injection 2 year ago,  Did help for a while.  She has not seen an orthopedist  Since her relocation.   Takes the oxycodone for control of pain, but has resumed the celebrex for the past 2 weeks.   Lab Results  Component Value Date   CREATININE 0.93 05/25/2015   CREATININE 0.97 05/12/2015   CREATININE 1.24* 05/02/2015   Lab Results  Component Value Date   HGBA1C 6.0 04/11/2015      Outpatient Prescriptions Prior to Visit  Medication Sig Dispense Refill  . amLODipine (NORVASC) 5 MG tablet Take 1 tablet (5 mg total) by mouth daily. 90 tablet 3  . aspirin EC 81 MG tablet Take 81 mg by mouth.    Marland Kitchen b complex vitamins capsule Take by mouth.    . celecoxib (CELEBREX) 200 MG capsule Take 200 mg by mouth 2 (two) times daily.     . cycloSPORINE (RESTASIS) 0.05 % ophthalmic emulsion 1 drop Two (2) times a day.    . diclofenac sodium (VOLTAREN) 1 % GEL Apply 2 g topically 4 (four) times daily.     . ergocalciferol (DRISDOL) 50000 units capsule Take 1 capsule (50,000 Units total) by mouth once a week. 12 capsule 0  . estradiol (ESTRACE) 1 MG tablet Take 1 tablet (1 mg total) by  mouth daily. 90 tablet 3  . ezetimibe (ZETIA) 10 MG tablet Take 1 tablet (10 mg total) by mouth daily. 90 tablet 3  . fluticasone (FLONASE) 50 MCG/ACT nasal spray 1 spray by Each Nare route daily.    . furosemide (LASIX) 20 MG tablet Take 20 mg by mouth daily as needed.     Marland Kitchen levothyroxine (SYNTHROID, LEVOTHROID) 200 MCG tablet Take 1 tablet (200 mcg total) by mouth daily before breakfast. 90 tablet 1  . Liraglutide 18 MG/3ML SOPN Inject 0.6 mg into the skin daily.     . Omega-3 1000 MG CAPS Take 1 capsule by mouth daily.     . pantoprazole (PROTONIX) 40 MG tablet Take 1 tablet (40 mg total) by mouth daily. 90 tablet 3  . rosuvastatin (CRESTOR) 20 MG tablet Take 1 tablet (20 mg total) by mouth daily. 90 tablet 1  . oxyCODONE 30 MG 12 hr tablet Take 1 tablet by mouth daily. 60 each 0  . oxyCODONE 30 MG 12 hr tablet Take 1 tablet by mouth daily. 60 each 0  . oxyCODONE 30 MG 12 hr tablet Take 1 tablet by mouth daily. 60 each 0  . oxyCODONE-acetaminophen (PERCOCET) 10-325 MG tablet Take 1 tablet by mouth every 4 (four) hours as needed. 180 tablet 0  .  oxyCODONE-acetaminophen (PERCOCET) 10-325 MG tablet Take 1 tablet by mouth every 4 (four) hours as needed. 180 tablet 0  . oxyCODONE-acetaminophen (PERCOCET) 10-325 MG tablet Take 1 tablet by mouth every 4 (four) hours as needed. 180 tablet 0  . tolterodine (DETROL LA) 4 MG 24 hr capsule Take 1 capsule (4 mg total) by mouth daily. 90 capsule 1   No facility-administered medications prior to visit.    Review of Systems;  Patient denies headache, fevers, malaise, unintentional weight loss, skin rash, eye pain, sinus congestion and sinus pain, sore throat, dysphagia,  hemoptysis , cough, dyspnea, wheezing, chest pain, palpitations, orthopnea, edema, abdominal pain, nausea, melena, diarrhea, constipation, flank pain, dysuria, hematuria, urinary  Frequency, nocturia, numbness, tingling, seizures,  Focal weakness, Loss of consciousness,  Tremor, insomnia,  depression, anxiety, and suicidal ideation.      Objective:  BP 110/58 mmHg  Pulse 88  Temp(Src) 97.8 F (36.6 C) (Oral)  Resp 12  Ht  (1.702 m)  Wt 297 lb 12 oz (135.059 kg)  BMI 46.62 kg/m2  SpO2 94%  BP Readings from Last 3 Encounters:  05/25/15 110/58  04/25/15 124/60  01/23/15 98/52    Wt Readings from Last 3 Encounters:  05/25/15 297 lb 12 oz (135.059 kg)  04/25/15 292 lb (132.45 kg)  01/23/15 297 lb 8 oz (134.945 kg)    General appearance: alert, cooperative and appears stated age Ears: normal TM's and external ear canals both ears Throat: lips, mucosa, and tongue normal; teeth and gums normal Neck: no adenopathy, no carotid bruit, supple, symmetrical, trachea midline and thyroid not enlarged, symmetric, no tenderness/mass/nodules Back: symmetric, no curvature. ROM normal. No CVA tenderness. Lungs: clear to auscultation bilaterally Heart: regular rate and rhythm, S1, S2 normal, no murmur, click, rub or gallop Abdomen: soft, non-tender; bowel sounds normal; no masses,  no organomegaly Pulses: 2+ and symmetric Skin: Skin color, texture, turgor normal. No rashes or lesions Lymph nodes: Cervical, supraclavicular, and axillary nodes normal.  Lab Results  Component Value Date   HGBA1C 6.0 04/11/2015   HGBA1C 6.8* 12/28/2014    Lab Results  Component Value Date   CREATININE 0.93 05/25/2015   CREATININE 0.97 05/12/2015   CREATININE 1.24* 05/02/2015    Lab Results  Component Value Date   WBC 6.3 12/28/2014   HGB 12.5 12/28/2014   HCT 40 12/28/2014   PLT 322 12/28/2014   GLUCOSE 104* 05/25/2015   CHOL 133 04/11/2015   TRIG 174.0* 04/11/2015   HDL 44.40 04/11/2015   LDLCALC 54 04/11/2015   ALT 25 04/25/2015   AST 27 04/25/2015   NA 136 05/25/2015   K 4.0 05/25/2015   CL 100 05/25/2015   CREATININE 0.93 05/25/2015   BUN 20 05/25/2015   CO2 29 05/25/2015   TSH 1.53 04/11/2015   HGBA1C 6.0 04/11/2015   MICROALBUR 1.1 04/25/2015    Patient was  never admitted.  Assessment & Plan:   Problem List Items Addressed This Visit    Arthritis of knee, degenerative    Her renal function has not been adversely affected by the resuming of celebrex, and her pain is better controlled.  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  hemoglobin A1c is at goal of less than 7.0 . Patient is reminded to schedule an annual eye exam and foot exam is normal today. Patient has no microalbuminuria. Patient is tolerating Crestor for CAD risk reduction but had hyperkalemia on ACE/ARB for renal protection and hypertension   Lab Results  Component Value Date   HGBA1C 6.0 04/11/2015   Lab Results  Component Value Date   MICROALBUR 1.1 04/25/2015         Relevant Medications   glucose blood test strip   Urinary urgency    Resuming Detrol LA twice daily       Hyperkalemia    Other Visit Diagnoses    CKD (chronic kidney disease) stage 3, GFR 30-59 ml/min    -  Primary    Relevant Orders    Basic metabolic panel (Completed)       I have changed Bonnie Hinton's tolterodine. I am also having her start on glucose blood. Additionally, I am having her maintain her aspirin EC, b complex vitamins, celecoxib, cycloSPORINE, diclofenac sodium, fluticasone, furosemide, Liraglutide, Omega-3, ergocalciferol, estradiol, levothyroxine, ezetimibe, pantoprazole, rosuvastatin, amLODipine, oxyCODONE, oxyCODONE-acetaminophen, oxyCODONE-acetaminophen, and oxyCODONE-acetaminophen.  Meds ordered this encounter  Medications  . DISCONTD: oxyCODONE 30 MG 12 hr tablet    Sig: Take 1 tablet by mouth daily. Every 12 hours    Dispense:  60 each     Refill:  0    May refill on or after May 24 2015  . tolterodine (DETROL LA) 4 MG 24 hr capsule    Sig: Take 1 capsule (4 mg total) by mouth 2 (two) times daily.    Dispense:  180 capsule    Refill:  1    PHARMACY NOTE DOSE CHANGE to twice daily  . DISCONTD: oxyCODONE 30 MG 12 hr tablet    Sig: Take 1 tablet by mouth daily. Every 12 hours    Dispense:  60 each    Refill:  0    May refill on or after June 24 2015  . oxyCODONE 30 MG 12 hr tablet    Sig: Take 1 tablet by mouth daily. Every 12 hours    Dispense:  60 each    Refill:  0    May refill on or after Jul 24 2015  . oxyCODONE-acetaminophen (PERCOCET) 10-325 MG tablet    Sig: Take 1 tablet by mouth every 4 (four) hours as needed.    Dispense:  180 tablet    Refill:  0    May refill on or after March 1,2017  . oxyCODONE-acetaminophen (PERCOCET) 10-325 MG tablet    Sig: Take 1 tablet by mouth every 4 (four) hours as needed.    Dispense:  180 tablet    Refill:  0    May refill on or after June 24 2015  . oxyCODONE-acetaminophen (PERCOCET) 10-325 MG tablet    Sig: Take 1 tablet by mouth every 4 (four) hours as needed.    Dispense:  180 tablet    Refill:  0    May refill on or after Jul 24 2015  . glucose blood test strip    Sig: Use to test blood sugar once daily .  E11.22    Dispense:  100 each    Refill:  12    Medications Discontinued During This Encounter  Medication Reason  . oxyCODONE 30 MG 12 hr tablet   . oxyCODONE 30 MG 12 hr tablet   . oxyCODONE 30 MG 12 hr tablet Reorder  .  tolterodine (DETROL LA) 4 MG 24 hr capsule Reorder  . oxyCODONE 30 MG 12 hr tablet Reorder  . oxyCODONE 30 MG 12 hr tablet Reorder  . oxyCODONE-acetaminophen (PERCOCET) 10-325 MG tablet Reorder  . oxyCODONE-acetaminophen (PERCOCET) 10-325 MG tablet Reorder  . oxyCODONE-acetaminophen (PERCOCET) 10-325 MG tablet Reorder    Follow-up: Return in about 3 months (around 08/25/2015) for follow up diabetes.   Sherlene Shams, MD

## 2015-05-25 NOTE — Progress Notes (Signed)
Pre-visit discussion using our clinic review tool. No additional management support is needed unless otherwise documented below in the visit note.  

## 2015-05-27 MED ORDER — GLUCOSE BLOOD VI STRP
ORAL_STRIP | Status: DC
Start: 2015-05-27 — End: 2015-05-29

## 2015-05-27 NOTE — Assessment & Plan Note (Signed)
Currently well-controlled on current medications .  hemoglobin A1c is at goal of less than 7.0 . Patient is reminded to schedule an annual eye exam and foot exam is normal today. Patient has no microalbuminuria. Patient is tolerating Crestor for CAD risk reduction but had hyperkalemia on ACE/ARB for renal protection and hypertension   Lab Results  Component Value Date   HGBA1C 6.0 04/11/2015   Lab Results  Component Value Date   MICROALBUR 1.1 04/25/2015

## 2015-05-27 NOTE — Assessment & Plan Note (Addendum)
Her renal function has not been adversely affected by the resuming of celebrex, and her pain is better controlled.  Discussed Orthopedics referral to steroid injection

## 2015-05-27 NOTE — Assessment & Plan Note (Signed)
Resuming Detrol LA twice daily

## 2015-05-29 ENCOUNTER — Telehealth: Payer: Self-pay | Admitting: *Deleted

## 2015-05-29 DIAGNOSIS — E1122 Type 2 diabetes mellitus with diabetic chronic kidney disease: Secondary | ICD-10-CM

## 2015-05-29 DIAGNOSIS — I129 Hypertensive chronic kidney disease with stage 1 through stage 4 chronic kidney disease, or unspecified chronic kidney disease: Principal | ICD-10-CM

## 2015-05-29 DIAGNOSIS — IMO0001 Reserved for inherently not codable concepts without codable children: Secondary | ICD-10-CM

## 2015-05-29 MED ORDER — GLUCOSE BLOOD VI STRP: ORAL_STRIP | Status: DC

## 2015-05-29 NOTE — Telephone Encounter (Signed)
Patient requested a Rx One Touch test strips   [facility] [address]

## 2015-05-29 NOTE — Telephone Encounter (Signed)
Pt is [facility] one [facility] b/c she is afraid that she will not get [facility] test [facility].

## 2015-06-01 ENCOUNTER — Telehealth: Payer: Self-pay | Admitting: Internal Medicine

## 2015-06-01 DIAGNOSIS — Z01810 Encounter for preprocedural cardiovascular examination: Secondary | ICD-10-CM

## 2015-06-01 NOTE — Telephone Encounter (Signed)
I received request for surgical clearance from [facility] for her right knee replacement .  I have cleared her medically but would prefer that she see Cardiology for clearance so i am making the referral to [facility]

## 2015-06-02 NOTE — Telephone Encounter (Signed)
Left patient message to return call  To office.

## 2015-06-09 ENCOUNTER — Encounter: Payer: Self-pay | Admitting: Cardiology

## 2015-06-09 ENCOUNTER — Telehealth: Payer: Self-pay | Admitting: Cardiology

## 2015-06-09 NOTE — Telephone Encounter (Signed)
Patient has surgical clearance forms for right total knee replacement with [facility]. Form placed in red folder in To Do Bin on Katharyn Schauer's desk.

## 2015-06-12 ENCOUNTER — Telehealth: Payer: Self-pay

## 2015-06-12 DIAGNOSIS — M170 Bilateral primary osteoarthritis of knee: Secondary | ICD-10-CM

## 2015-06-12 NOTE — Telephone Encounter (Signed)
DME rx printed.

## 2015-06-12 NOTE — Telephone Encounter (Signed)
Waiting for daughter to call back for medical supply number.

## 2015-06-12 NOTE — Telephone Encounter (Signed)
Daughter states that the pt needs a order written for a new shower chair, pt's recent chair is broken and pt can not shower. Please advise, thinks

## 2015-06-13 NOTE — Telephone Encounter (Signed)
DME order sent.

## 2015-06-20 ENCOUNTER — Ambulatory Visit: Payer: Medicare Other | Admitting: Cardiology

## 2015-06-22 ENCOUNTER — Ambulatory Visit (INDEPENDENT_AMBULATORY_CARE_PROVIDER_SITE_OTHER): Payer: Medicare Other | Admitting: Cardiology

## 2015-06-22 ENCOUNTER — Encounter: Payer: Self-pay | Admitting: Cardiology

## 2015-06-22 VITALS — BP 154/82 | HR 75 | Resp 17 | Ht 65.0 in | Wt 296.8 lb

## 2015-06-22 DIAGNOSIS — Z01818 Encounter for other preprocedural examination: Secondary | ICD-10-CM | POA: Diagnosis not present

## 2015-06-22 DIAGNOSIS — I10 Essential (primary) hypertension: Secondary | ICD-10-CM | POA: Diagnosis not present

## 2015-06-22 DIAGNOSIS — R9431 Abnormal electrocardiogram [ECG] [EKG]: Secondary | ICD-10-CM

## 2015-06-22 DIAGNOSIS — E785 Hyperlipidemia, unspecified: Secondary | ICD-10-CM

## 2015-06-22 NOTE — Patient Instructions (Addendum)
Medication Instructions:  Your physician recommends that you continue on your current medications as directed. Please refer to the Current Medication list given to you today.   Labwork: None Ordered  Testing/Procedures: Your physician has requested that you have an echocardiogram. Echocardiography is a painless test that uses sound waves to create images of your heart. It provides your doctor with information about the size and shape of your heart and how well your heart's chambers and valves are working. This procedure takes approximately one hour. There are no restrictions for this procedure.  Date & Time: ____________________________________________________________________  Your physician has requested that you have a lexiscan myoview. For further information please visit [email or web address] Please follow instruction sheet, as given.  Date of Procedure:_Thursday & Friday April 6 & 7, 2017 at 08:00AM_______  Arrival Time for Procedure:____Arrive both days at 07:45AM______    Follow-Up: Your physician recommends that you schedule a follow-up appointment after testing to review results.  Date & Time: _______________________________________________________________________   Any Other Special Instructions Will Be Listed Below (If Applicable).  [facility] MYOVIEW  Your caregiver has ordered a Stress Test with nuclear imaging. The purpose of this test is to evaluate the blood supply to your heart muscle. This procedure is referred to as a "Non-Invasive Stress Test." This is because other than having an IV started in your vein, nothing is inserted or "invades" your body. Cardiac stress tests are done to find areas of poor blood flow to the heart by determining the extent of coronary artery disease (CAD). Some patients exercise on a treadmill, which naturally increases the blood flow to your heart, while others who are  unable to walk on a treadmill due to physical limitations have a  pharmacologic/chemical stress agent called Lexiscan . This medicine will mimic walking on a treadmill by temporarily increasing your coronary blood flow.   Please note: these test may take anywhere between 2-4 hours to complete  PLEASE REPORT TO [facility] MEDICAL MALL ENTRANCE  THE VOLUNTEERS AT THE FIRST DESK WILL DIRECT YOU WHERE TO GO  Date of Procedure:_Thursday & Friday April 6 & 7, 2017 at 08:00AM_______  Arrival Time for Procedure:____Arrive both days at 07:45AM______  Instructions regarding medication:   __X__ : Hold diabetes medication morning of procedure  __X__:  Hold other medications as follows:_HOLD Lasix as well before testing.      PLEASE NOTIFY THE OFFICE AT LEAST 24 HOURS IN ADVANCE IF YOU ARE UNABLE TO KEEP YOUR APPOINTMENT.  [phone] AND  PLEASE NOTIFY NUCLEAR MEDICINE AT [facility] AT LEAST 24 HOURS IN ADVANCE IF YOU ARE UNABLE TO KEEP YOUR APPOINTMENT. [phone]  How to prepare for your Myoview test:   Do not eat or drink after midnight  No caffeine for 24 hours prior to test  No smoking 24 hours prior to test.  Your medication may be taken with water.  If your doctor stopped a medication because of this test, do not take that medication.  Ladies, please do not wear dresses.  Skirts or pants are appropriate. Please wear a short sleeve shirt.  No perfume, cologne or lotion.  Wear comfortable walking shoes. No heels!             If you need a refill on your cardiac medications before your next appointment, please call your pharmacy.  Echocardiogram An echocardiogram, or echocardiography, uses sound waves (ultrasound) to produce an image of your heart. The echocardiogram is simple, painless, obtained within a short period of time, and offers valuable information to  your health care provider. The images from an echocardiogram can provide information such as:  Evidence of coronary artery disease (CAD).  Heart size.  Heart muscle  function.  Heart valve function.  Aneurysm detection.  Evidence of a past heart attack.  Fluid buildup around the heart.  Heart muscle thickening.  Assess heart valve function. LET [facility] CARE PROVIDER KNOW ABOUT:  Any allergies you have.  All medicines you are taking, including vitamins, herbs, eye drops, creams, and over-the-counter medicines.  Previous problems you or members of your family have had with the use of anesthetics.  Any blood disorders you have.  Previous surgeries you have had.  Medical conditions you have.  Possibility of pregnancy, if this applies. BEFORE THE PROCEDURE  No special preparation is needed. Eat and drink normally.  PROCEDURE   In order to produce an image of your heart, gel will be applied to your chest and a wand-like tool (transducer) will be moved over your chest. The gel will help transmit the sound waves from the transducer. The sound waves will harmlessly bounce off your heart to allow the heart images to be captured in real-time motion. These images will then be recorded.  You may need an IV to receive a medicine that improves the quality of the pictures. AFTER THE PROCEDURE You may return to your normal schedule including diet, activities, and medicines, unless your health care provider tells you otherwise.   This information is not intended to replace advice given to you by your health care provider. Make sure you discuss any questions you have with your health care provider.   Document Released: 03/08/2000 Document Revised: 04/01/2014 Document Reviewed: 11/16/2012 Elsevier Interactive Patient Education 2016 [employer]     Pharmacologic Stress Electrocardiogram A pharmacologic stress electrocardiogram is a heart (cardiac) test that uses nuclear imaging to evaluate the blood supply to your heart. This test may also be called a pharmacologic stress electrocardiography. Pharmacologic means that a medicine is used to  increase your heart rate and blood pressure.  This stress test is done to find areas of poor blood flow to the heart by determining the extent of coronary artery disease (CAD). Some people exercise on a treadmill, which naturally increases the blood flow to the heart. For those people unable to exercise on a treadmill, a medicine is used. This medicine stimulates your heart and will cause your heart to beat harder and more quickly, as if you were exercising.  Pharmacologic stress tests can help determine:  The adequacy of blood flow to your heart during increased levels of activity in order to clear you for discharge home.  The extent of coronary artery blockage caused by CAD.  Your prognosis if you have suffered a heart attack.  The effectiveness of cardiac procedures done, such as an angioplasty, which can increase the circulation in your coronary arteries.  Causes of chest pain or pressure. LET [facility] CARE PROVIDER KNOW ABOUT:  Any allergies you have.  All medicines you are taking, including vitamins, herbs, eye drops, creams, and over-the-counter medicines.  Previous problems you or members of your family have had with the use of anesthetics.  Any blood disorders you have.  Previous surgeries you have had.  Medical conditions you have.  Possibility of pregnancy, if this applies.  If you are currently breastfeeding. RISKS AND COMPLICATIONS Generally, this is a safe procedure. However, as with any procedure, complications can occur. Possible complications include:  You develop pain or pressure in the  following areas:  Chest.  Jaw or neck.  Between your shoulder blades.  Radiating down your left arm.  Headache.  Dizziness or light-headedness.  Shortness of breath.  Increased or irregular heartbeat.  Low blood pressure.  Nausea or vomiting.  Flushing.  Redness going up the arm and slight pain during injection of medicine.  Heart attack (rare). BEFORE  THE PROCEDURE   Avoid all forms of caffeine for 24 hours before your test or as directed by your health care provider. This includes coffee, tea (even decaffeinated tea), caffeinated sodas, chocolate, cocoa, and certain pain medicines.  Follow your health care provider's instructions regarding eating and drinking before the test.  Take your medicines as directed at regular times with water unless instructed otherwise. Exceptions may include:  If you have diabetes, ask how you are to take your insulin or pills. It is common to adjust insulin dosing the morning of the test.  If you are taking beta-blocker medicines, it is important to talk to your health care provider about these medicines well before the date of your test. Taking beta-blocker medicines may interfere with the test. In some cases, these medicines need to be changed or stopped 24 hours or more before the test.  If you wear a nitroglycerin patch, it may need to be removed prior to the test. Ask your health care provider if the patch should be removed before the test.  If you use an inhaler for any breathing condition, bring it with you to the test.  If you are an outpatient, bring a snack so you can eat right after the stress phase of the test.  Do not smoke for 4 hours prior to the test or as directed by your health care provider.  Do not apply lotions, powders, creams, or oils on your chest prior to the test.  Wear comfortable shoes and clothing. Let your health care provider know if you were unable to complete or follow the preparations for your test. PROCEDURE   Multiple patches (electrodes) will be put on your chest. If needed, small areas of your chest may be shaved to get better contact with the electrodes. Once the electrodes are attached to your body, multiple wires will be attached to the electrodes, and your heart rate will be monitored.  An IV access will be started. A nuclear trace (isotope) is given. The isotope  may be given intravenously, or it may be swallowed. Nuclear refers to several types of radioactive isotopes, and the nuclear isotope lights up the arteries so that the nuclear images are clear. The isotope is absorbed by your body. This results in low radiation exposure.  A resting nuclear image is taken to show how your heart functions at rest.  A medicine is given through the IV access.  A second scan is done about 1 hour after the medicine injection and determines how your heart functions under stress.  During this stress phase, you will be connected to an electrocardiogram machine. Your blood pressure and oxygen levels will be monitored. AFTER THE PROCEDURE   Your heart rate and blood pressure will be monitored after the test.  You may return to your normal schedule, including diet,activities, and medicines, unless your health care provider tells you otherwise.   This information is not intended to replace advice given to you by your health care provider. Make sure you discuss any questions you have with your health care provider.   Document Released: 07/28/2008 Document Revised: 03/16/2013 Document Reviewed: 11/16/2012  Chartered certified accountant Patient Education [employer].

## 2015-06-22 NOTE — Progress Notes (Addendum)
Review     Cardiology Office Note   Date:  06/22/2015   ID:  Bonnie Hinton, DOB 28-Sep-1940, MRN [id number]  Referring Doctor:  Sherlene Shams, MD   Cardiologist:   Almond Lint, MD   Reason for consultation:  Chief Complaint  Patient presents with  . Pre-op Exam    Pt needs cardiac clearance right total knee placement. Meds reviewed verbally with pt.      History of Present Illness: Bonnie Hinton is a 75 y.o. female who presents for Preoperative cardiac evaluation prior to right knee total replacement  Patient has worsening significant right knee pain. This been going on for over many years now. This limits her physical mobility significantly.  She has been trying to lose weight and has indeed lost about 50 pounds with diet alone.  Although she has very limited functional capacity due to difficulty with mobility, patient does not have any complaints of chest pain, shortness of breath, palpitations, orthopnea, PND, edema.   ROS:  Please see the history of present illness. Aside from mentioned under HPI, all other systems are reviewed and negative.     Past Medical History  Diagnosis Date  . Arthritis   . Diabetes mellitus without complication (HCC)     type 2  . Hypertension   . Thyroid disease   . Chronic kidney disease   . Subdural hematoma (HCC)     w/ Hemorhagic stroke    Past Surgical History  Procedure Laterality Date  . Joint replacement    . Abdominal hysterectomy       reports that she has never smoked. She has never used smokeless tobacco. She reports that she does not drink alcohol or use illicit drugs.   family history includes Cancer in her brother and father; Diabetes in her maternal aunt.   Current Outpatient Prescriptions  Medication Sig Dispense Refill  . amLODipine (NORVASC) 5 MG tablet Take 1 tablet (5 mg total) by mouth daily. 90 tablet 3  . aspirin EC 81 MG tablet Take 81 mg by mouth.    Marland Kitchen b complex vitamins capsule Take by  mouth.    . celecoxib (CELEBREX) 200 MG capsule Take 200 mg by mouth 2 (two) times daily.     . cycloSPORINE (RESTASIS) 0.05 % ophthalmic emulsion 1 drop Two (2) times a day.    . diclofenac sodium (VOLTAREN) 1 % GEL Apply 2 g topically 4 (four) times daily.     . ergocalciferol (DRISDOL) 50000 units capsule Take 1 capsule (50,000 Units total) by mouth once a week. 12 capsule 0  . estradiol (ESTRACE) 1 MG tablet Take 1 tablet (1 mg total) by mouth daily. 90 tablet 3  . ezetimibe (ZETIA) 10 MG tablet Take 1 tablet (10 mg total) by mouth daily. 90 tablet 3  . fluticasone (FLONASE) 50 MCG/ACT nasal spray 1 spray by Each Nare route daily.    . furosemide (LASIX) 20 MG tablet Take 20 mg by mouth daily as needed.     Marland Kitchen glucose blood test strip Use to test blood sugar once daily .  E11.22 100 each 0  . levothyroxine (SYNTHROID, LEVOTHROID) 200 MCG tablet Take 1 tablet (200 mcg total) by mouth daily before breakfast. 90 tablet 1  . Liraglutide 18 MG/3ML SOPN Inject 0.6 mg into the skin daily.     . Omega-3 1000 MG CAPS Take 1 capsule by mouth daily.     Marland Kitchen oxyCODONE 30 MG 12 hr tablet Take 1 tablet by  mouth daily. Every 12 hours 60 each 0  . oxyCODONE-acetaminophen (PERCOCET) 10-325 MG tablet Take 1 tablet by mouth every 4 (four) hours as needed. 180 tablet 0  . oxyCODONE-acetaminophen (PERCOCET) 10-325 MG tablet Take 1 tablet by mouth every 4 (four) hours as needed. 180 tablet 0  . oxyCODONE-acetaminophen (PERCOCET) 10-325 MG tablet Take 1 tablet by mouth every 4 (four) hours as needed. 180 tablet 0  . pantoprazole (PROTONIX) 40 MG tablet Take 1 tablet (40 mg total) by mouth daily. 90 tablet 3  . rosuvastatin (CRESTOR) 20 MG tablet Take 1 tablet (20 mg total) by mouth daily. 90 tablet 1  . tolterodine (DETROL LA) 4 MG 24 hr capsule Take 1 capsule (4 mg total) by mouth 2 (two) times daily. 180 capsule 1   No current facility-administered medications for this visit.    Allergies: Erythromycin;  Iodinated diagnostic agents; Fentanyl; Latex; and Lisinopril    PHYSICAL EXAM: VS:  BP 154/82 mmHg  Pulse 75  Resp 17  Ht 5\' 5"  (1.651 m)  Wt 296 lb 12 oz (134.605 kg)  BMI 49.38 kg/m2 , Body mass index is 49.38 kg/(m^2). Wt Readings from Last 3 Encounters:  06/22/15 296 lb 12 oz (134.605 kg)  05/25/15 297 lb 12 oz (135.059 kg)  04/25/15 292 lb (132.45 kg)    GENERAL:  well developed, well nourished, Morbidly obese, not in acute distress HEENT: normocephalic, pink conjunctivae, anicteric sclerae, no xanthelasma, normal dentition, oropharynx clear NECK:  no neck vein engorgement, JVP normal, no hepatojugular reflux, carotid upstroke brisk and symmetric, no bruit, no thyromegaly, no lymphadenopathy LUNGS:  good respiratory effort, clear to auscultation bilaterally CV:  PMI not displaced, no thrills, no lifts, S1 and S2 within normal limits, no palpable S3 or S4, no murmurs, no rubs, no gallops ABD:  Soft, nontender, nondistended, normoactive bowel sounds, no abdominal aortic bruit, no hepatomegaly, no splenomegaly MS: nontender back, no kyphosis, no scoliosis, no joint deformities, right knee in a brace EXT:  2+ DP/PT pulses, no edema, no varicosities, no cyanosis, no clubbing SKIN: warm, nondiaphoretic, normal turgor, no ulcers NEUROPSYCH: alert, oriented to person, place, and time, sensory/motor grossly intact, normal mood, appropriate affect  Recent Labs: 12/28/2014: Hemoglobin 12.5; Platelets 322 04/11/2015: TSH 1.53 04/25/2015: ALT 25 05/25/2015: BUN 20; Creatinine, Ser 0.93; Potassium 4.0; Sodium 136   Lipid Panel    Component Value Date/Time   CHOL 133 04/11/2015 1334   TRIG 174.0* 04/11/2015 1334   HDL 44.40 04/11/2015 1334   CHOLHDL 3 04/11/2015 1334   VLDL 34.8 04/11/2015 1334   LDLCALC 54 04/11/2015 1334     Other studies Reviewed:  EKG:  EKG Is ordered today. The ekg from 06/22/2015 was personally reviewed by me and it revealed sinus rhythm 72 BPM. Poor R-wave  progression, likely due to patient's size.  Additional studies/ records that were reviewed personally reviewed by me today include: None available   ASSESSMENT AND PLAN:  Preoperative cardiac evaluation prior to right knee total replacement Hypertension Hyperlipidemia Morbid obesity Abnormal EKG (PRWP)  Unable to assess functional capacity historically to 2 immobility from right knee pain. Recommend further evaluation with pharmacologic nuclear stress test. Patient has risk factors for coronary artery disease including age, postmenopausal state, hypertension, hyperlipidemia, morbid obesity. Recommend echocardiogram as well.  In terms of hypertension, recommend blood pressure log. Continue medications for now. Goal is less than 140/80 of possible.  In terms of hyperlipidemia, continue statin therapy for now. PCP following labs.  Morbid obesity Continue  with dietary restrictions. Hopefully, physical activity will increase once right knee pain is improved  Will await results of stress test and echo car to [place]. If no ischemia on stress test, and echocardiogram unremarkable, patient will likely be intermediate cardiac risk for moderate risk surgery.  Current medicines are reviewed at length with the patient today.  The patient does not have concerns regarding medicines.  Labs/ tests ordered today include:  Orders Placed This Encounter  Procedures  . NM Myocar Multi W/Spect W/Wall Motion / EF  . EKG 12-Lead  . Echocardiogram    I had a lengthy and detailed discussion with the patient regarding diagnoses, prognosis, diagnostic options, treatment options .      Disposition:   FU with undersigned after tests    Signed, Almond Lint, MD  06/22/2015 1:42 PM    [facility]

## 2015-06-27 ENCOUNTER — Telehealth: Payer: Self-pay | Admitting: Cardiology

## 2015-06-27 NOTE — Telephone Encounter (Signed)
Called and verified her upcoming 2 day stress test on Thursday & Friday and instructions for testing. She verbalized understanding and had no further questions at this time.

## 2015-06-29 ENCOUNTER — Ambulatory Visit: Payer: Medicare Other

## 2015-06-29 ENCOUNTER — Ambulatory Visit
Admission: RE | Admit: 2015-06-29 | Discharge: 2015-06-29 | Disposition: A | Discharge status: Home / Self Care | Payer: Medicare Other | Source: Ambulatory Visit | Attending: Cardiology | Admitting: Cardiology

## 2015-06-29 ENCOUNTER — Other Ambulatory Visit: Payer: Medicare Other

## 2015-06-29 DIAGNOSIS — Z01818 Encounter for other preprocedural examination: Secondary | ICD-10-CM

## 2015-06-29 LAB — NM MYOCAR MULTI W/SPECT W/WALL MOTION / EF
Estimated workload: 1 METS
Exercise duration (min): 0 min
Exercise duration (sec): 0 s
MPHR: 146 {beats}/min
Peak HR: 95 {beats}/min
Percent HR: 65 %
Rest HR: 80 {beats}/min

## 2015-06-29 MED ORDER — REGADENOSON 0.4 MG/5ML IV SOLN
0.4000 mg | Freq: Once | INTRAVENOUS | Status: AC
  Administered 2015-06-29: 0.4 mg via INTRAVENOUS
  Filled 2015-06-29: qty 5

## 2015-06-29 MED ORDER — TECHNETIUM TC 99M SESTAMIBI - CARDIOLITE
30.0000 | Freq: Once | INTRAVENOUS | Status: AC | PRN
  Administered 2015-06-29: 09:00:00 32.39 via INTRAVENOUS

## 2015-06-30 ENCOUNTER — Ambulatory Visit: Admission: RE | Admit: 2015-06-30 | Payer: Medicare Other | Source: Ambulatory Visit

## 2015-07-05 ENCOUNTER — Telehealth: Payer: Self-pay | Admitting: Internal Medicine

## 2015-07-05 NOTE — Telephone Encounter (Signed)
Olegario MessierKathy,  Can you assist with this request. thanks

## 2015-07-05 NOTE — Telephone Encounter (Signed)
Left a voicemail on daughters number asking for the correct fax number or to verify the office that we need to fax the DME order to. (placed order back on Kathy's desk)

## 2015-07-05 NOTE — Telephone Encounter (Signed)
Re- printed DME from chart and signed by MD re-faxed  From the original dated in March. FYI

## 2015-07-05 NOTE — Telephone Encounter (Signed)
[name] called this morning about ordering her mother a bath seat, hers broke. Daughter says this was suppose to be ordered a month ago. She would like the [facility], attn: Grover CanavanKrystal. Fax number is [phone]. [name] can be reach at [phone].

## 2015-07-06 NOTE — Telephone Encounter (Signed)
Per the patient's daughter the DME company is [facility] [phone] put attention Grover CanavanKrystal on the form.

## 2015-07-06 NOTE — Telephone Encounter (Signed)
Order faxed.

## 2015-07-14 ENCOUNTER — Other Ambulatory Visit: Payer: Self-pay

## 2015-07-14 ENCOUNTER — Ambulatory Visit (INDEPENDENT_AMBULATORY_CARE_PROVIDER_SITE_OTHER): Payer: Medicare Other

## 2015-07-14 DIAGNOSIS — Z01818 Encounter for other preprocedural examination: Secondary | ICD-10-CM | POA: Diagnosis not present

## 2015-07-14 DIAGNOSIS — I10 Essential (primary) hypertension: Secondary | ICD-10-CM

## 2015-07-14 DIAGNOSIS — R9431 Abnormal electrocardiogram [ECG] [EKG]: Secondary | ICD-10-CM | POA: Diagnosis not present

## 2015-07-16 ENCOUNTER — Other Ambulatory Visit: Payer: Self-pay | Admitting: Internal Medicine

## 2015-07-25 ENCOUNTER — Encounter: Payer: Self-pay | Admitting: Cardiology

## 2015-07-25 ENCOUNTER — Ambulatory Visit (INDEPENDENT_AMBULATORY_CARE_PROVIDER_SITE_OTHER): Payer: Medicare Other | Admitting: Cardiology

## 2015-07-25 VITALS — BP 130/64 | HR 70 | Ht 66.0 in | Wt 296.0 lb

## 2015-07-25 DIAGNOSIS — I10 Essential (primary) hypertension: Secondary | ICD-10-CM | POA: Insufficient documentation

## 2015-07-25 DIAGNOSIS — Z01818 Encounter for other preprocedural examination: Secondary | ICD-10-CM

## 2015-07-25 DIAGNOSIS — E1169 Type 2 diabetes mellitus with other specified complication: Secondary | ICD-10-CM | POA: Insufficient documentation

## 2015-07-25 DIAGNOSIS — E785 Hyperlipidemia, unspecified: Secondary | ICD-10-CM | POA: Diagnosis not present

## 2015-07-25 NOTE — Patient Instructions (Signed)
Medication Instructions:  Your physician recommends that you continue on your current medications as directed. Please refer to the Current Medication list given to you today.  Labwork: None ordered.  Testing/Procedures: None ordered.  Follow-Up: Your physician recommends that you schedule a follow-up appointment as needed.   Any Other Special Instructions Will Be Listed Below (If Applicable).     If you need a refill on your cardiac medications before your next appointment, please call your pharmacy.   

## 2015-07-25 NOTE — Progress Notes (Signed)
Review     Cardiology Office Note   Date:  07/25/2015   ID:  Bonnie Natterlizabeth Barra, DOB 05-10-1940, MRN [id number]  Referring Doctor:  Sherlene ShamsULLO, TERESA L, MD   Cardiologist:   Almond LintAileen Andi Mahaffy, MD   Reason for consultation:  Chief Complaint  Patient presents with  . other    F/u echo and myoview needs cardiac clearance for right knee surgery. Meds reviewed verbally.      History of Present Illness: Bonnie Hinton is a 75 y.o. female who presents for Follow-up after tests.  Although she has very limited functional capacity due to difficulty with mobility, patient does not have any complaints of chest pain, shortness of breath, palpitations, orthopnea, PND, edema.   ROS:  Please see the history of present illness. Aside from mentioned under HPI, all other systems are reviewed and negative.     Past Medical History  Diagnosis Date  . Arthritis   . Diabetes mellitus without complication (HCC)     type 2  . Hypertension   . Thyroid disease   . Chronic kidney disease   . Subdural hematoma (HCC)     w/ Hemorhagic stroke    Past Surgical History  Procedure Laterality Date  . Joint replacement    . Abdominal hysterectomy       reports that she has never smoked. She has never used smokeless tobacco. She reports that she does not drink alcohol or use illicit drugs.   family history includes Cancer in her brother and father; Diabetes in her maternal aunt.   Current Outpatient Prescriptions  Medication Sig Dispense Refill  . amLODipine (NORVASC) 5 MG tablet Take 1 tablet (5 mg total) by mouth daily. 90 tablet 3  . aspirin EC 81 MG tablet Take 81 mg by mouth.    Marland Kitchen. b complex vitamins capsule Take by mouth.    . celecoxib (CELEBREX) 200 MG capsule Take 200 mg by mouth 2 (two) times daily.     . cycloSPORINE (RESTASIS) 0.05 % ophthalmic emulsion 1 drop Two (2) times a day.    . diclofenac sodium (VOLTAREN) 1 % GEL Apply 2 g topically 4 (four) times daily.     Marland Kitchen. estradiol (ESTRACE) 1  MG tablet Take 1 tablet (1 mg total) by mouth daily. 90 tablet 3  . ezetimibe (ZETIA) 10 MG tablet Take 1 tablet (10 mg total) by mouth daily. 90 tablet 3  . fluticasone (FLONASE) 50 MCG/ACT nasal spray 1 spray by Each Nare route daily.    . furosemide (LASIX) 20 MG tablet Take 20 mg by mouth daily as needed.     Marland Kitchen. glucose blood test strip Use to test blood sugar once daily .  E11.22 100 each 0  . levothyroxine (SYNTHROID, LEVOTHROID) 200 MCG tablet Take 1 tablet (200 mcg total) by mouth daily before breakfast. 90 tablet 1  . Liraglutide 18 MG/3ML SOPN Inject 0.6 mg into the skin daily.     . Omega-3 1000 MG CAPS Take 1 capsule by mouth daily.     Marland Kitchen. oxyCODONE 30 MG 12 hr tablet Take 1 tablet by mouth daily. Every 12 hours 60 each 0  . oxyCODONE-acetaminophen (PERCOCET) 10-325 MG tablet Take 1 tablet by mouth every 4 (four) hours as needed. 180 tablet 0  . oxyCODONE-acetaminophen (PERCOCET) 10-325 MG tablet Take 1 tablet by mouth every 4 (four) hours as needed. 180 tablet 0  . oxyCODONE-acetaminophen (PERCOCET) 10-325 MG tablet Take 1 tablet by mouth every 4 (four) hours as needed.  180 tablet 0  . pantoprazole (PROTONIX) 40 MG tablet Take 1 tablet (40 mg total) by mouth daily. 90 tablet 3  . rosuvastatin (CRESTOR) 20 MG tablet Take 1 tablet (20 mg total) by mouth daily. 90 tablet 1  . tolterodine (DETROL LA) 4 MG 24 hr capsule Take 1 capsule (4 mg total) by mouth 2 (two) times daily. 180 capsule 1  . Vitamin D, Ergocalciferol, (DRISDOL) 50000 units CAPS capsule TAKE 1 CAPSULE (50,000 UNITS TOTAL) BY MOUTH ONCE A WEEK. 12 capsule 0   No current facility-administered medications for this visit.    Allergies: Erythromycin; Iodinated diagnostic agents; Fentanyl; Latex; and Lisinopril    PHYSICAL EXAM: VS:  BP 130/64 mmHg  Pulse 95  Ht 5\' 6"  (1.676 m)  Wt 296 lb (134.265 kg)  BMI 47.80 kg/m2 , Body mass index is 47.8 kg/(m^2). Wt Readings from Last 3 Encounters:  07/25/15 296 lb (134.265 kg)    06/22/15 296 lb 12 oz (134.605 kg)  05/25/15 297 lb 12 oz (135.059 kg)    GENERAL:  well developed, well nourished, Morbidly obese, not in acute distress HEENT: normocephalic, pink conjunctivae, anicteric sclerae, no xanthelasma, normal dentition, oropharynx clear NECK:  no neck vein engorgement, JVP normal, no hepatojugular reflux, carotid upstroke brisk and symmetric, no bruit, no thyromegaly, no lymphadenopathy LUNGS:  good respiratory effort, clear to auscultation bilaterally CV:  PMI not displaced, no thrills, no lifts, S1 and S2 within normal limits, no palpable S3 or S4, no murmurs, no rubs, no gallops ABD:  Soft, nontender, nondistended, normoactive bowel sounds, no abdominal aortic bruit, no hepatomegaly, no splenomegaly MS: nontender back, no kyphosis, no scoliosis, no joint deformities, right knee in a brace EXT:  2+ DP/PT pulses, no edema, no varicosities, no cyanosis, no clubbing SKIN: warm, nondiaphoretic, normal turgor, no ulcers NEUROPSYCH: alert, oriented to person, place, and time, sensory/motor grossly intact, normal mood, appropriate affect  Recent Labs: 12/28/2014: Hemoglobin 12.5; Platelets 322 04/11/2015: TSH 1.53 04/25/2015: ALT 25 05/25/2015: BUN 20; Creatinine, Ser 0.93; Potassium 4.0; Sodium 136   Lipid Panel    Component Value Date/Time   CHOL 133 04/11/2015 1334   TRIG 174.0* 04/11/2015 1334   HDL 44.40 04/11/2015 1334   CHOLHDL 3 04/11/2015 1334   VLDL 34.8 04/11/2015 1334   LDLCALC 54 04/11/2015 1334     Other studies Reviewed:  EKG:   The ekg from 06/22/2015 was personally reviewed by me and it revealed sinus rhythm 72 BPM. Poor R-wave progression, likely due to patient's size.  Additional studies/ records that were reviewed personally reviewed by me today include:  Echocardiogram 07/14/2015: Left ventricle: The cavity size was normal. There was mild  concentric hypertrophy. Systolic function was normal. The  estimated ejection fraction was in  the range of 55% to 60%. Wall  motion was normal; there were no regional wall motion  abnormalities. Left ventricular diastolic function parameters  were normal. - Left atrium: The atrium was mildly dilated.  Pharmacologic stress test 06/29/2015: Pharmacological myocardial perfusion imaging study with no significant ischemia Normal wall motion, EF estimated at 60% No EKG changes concerning for ischemia at peak stress or in recovery. Low risk scan   ASSESSMENT AND PLAN:  Preoperative cardiac evaluation prior to right knee total replacement Hypertension Hyperlipidemia Morbid obesity Abnormal EKG (PRWP)  Preoperative cardiac evaluation prior to knee surgery Discussed findings of stress test and echocardiogram. Stress is was negative for ischemia. Based on this, likelihood of clinically significant CAD is low. Patient was reassured.  Patient's cardiac risk is intermediate for moderate risk procedure. No indication for further cardiac testing at this point. Recommend to continue risk factor modification.  In terms of hypertension, recommend blood pressure log. Continue medications for now. Goal is less than 140/80 of possible.  In terms of hyperlipidemia, continue statin therapy for now. LDL goal is less than 70 due to history of diabetes. PCP following labs.  Morbid obesity Continue with dietary restrictions. Hopefully, physical activity will increase once right knee pain is improved.  Will await results of stress test and echo. If no ischemia on stress test, and echocardiogram unremarkable, patient will likely be intermediate cardiac risk for moderate risk surgery.  Current medicines are reviewed at length with the patient today.  The patient does not have concerns regarding medicines.  Labs/ tests ordered today include:  No orders of the defined types were placed in this encounter.    I had a lengthy and detailed discussion with the patient regarding diagnoses, prognosis,  diagnostic options, treatment options.   Disposition:   FU with undersigned .  Signed, Almond Lint, MD  07/25/2015 10:53 AM    [facility]

## 2015-08-01 ENCOUNTER — Ambulatory Visit (INDEPENDENT_AMBULATORY_CARE_PROVIDER_SITE_OTHER): Payer: Medicare Other | Admitting: Internal Medicine

## 2015-08-01 VITALS — BP 110/60 | HR 103 | Temp 97.5°F | Resp 12 | Ht 66.0 in | Wt 300.0 lb

## 2015-08-01 DIAGNOSIS — E785 Hyperlipidemia, unspecified: Secondary | ICD-10-CM | POA: Diagnosis not present

## 2015-08-01 DIAGNOSIS — N189 Chronic kidney disease, unspecified: Secondary | ICD-10-CM | POA: Diagnosis not present

## 2015-08-01 DIAGNOSIS — E559 Vitamin D deficiency, unspecified: Secondary | ICD-10-CM

## 2015-08-01 DIAGNOSIS — M1711 Unilateral primary osteoarthritis, right knee: Secondary | ICD-10-CM

## 2015-08-01 DIAGNOSIS — I129 Hypertensive chronic kidney disease with stage 1 through stage 4 chronic kidney disease, or unspecified chronic kidney disease: Secondary | ICD-10-CM | POA: Diagnosis not present

## 2015-08-01 DIAGNOSIS — E1122 Type 2 diabetes mellitus with diabetic chronic kidney disease: Secondary | ICD-10-CM

## 2015-08-01 DIAGNOSIS — IMO0001 Reserved for inherently not codable concepts without codable children: Secondary | ICD-10-CM

## 2015-08-01 LAB — COMPREHENSIVE METABOLIC PANEL
ALT: 54 U/L — ABNORMAL HIGH (ref 0–35)
AST: 23 U/L (ref 0–37)
Albumin: 4 g/dL (ref 3.5–5.2)
Alkaline Phosphatase: 137 U/L — ABNORMAL HIGH (ref 39–117)
BUN: 14 mg/dL (ref 6–23)
CO2: 29 mEq/L (ref 19–32)
Calcium: 9.2 mg/dL (ref 8.4–10.5)
Chloride: 100 mEq/L (ref 96–112)
Creatinine, Ser: 0.82 mg/dL (ref 0.40–1.20)
GFR: 72.32 mL/min (ref >60.00)
Glucose, Bld: 101 mg/dL — ABNORMAL HIGH (ref 70–99)
Potassium: 4.4 mEq/L (ref 3.5–5.1)
Sodium: 139 mEq/L (ref 135–145)
Total Bilirubin: 0.4 mg/dL (ref 0.2–1.2)
Total Protein: 7.2 g/dL (ref 6.0–8.3)

## 2015-08-01 LAB — LIPID PANEL
Cholesterol: 135 mg/dL (ref 0–200)
HDL: 58.8 mg/dL (ref >39.00)
LDL Cholesterol: 52 mg/dL (ref 0–99)
NonHDL: 76.05
Total CHOL/HDL Ratio: 2
Triglycerides: 120 mg/dL (ref 0.0–149.0)
VLDL: 24 mg/dL (ref 0.0–40.0)

## 2015-08-01 LAB — LDL CHOLESTEROL, DIRECT: Direct LDL: 56 mg/dL

## 2015-08-01 LAB — HEMOGLOBIN A1C: Hgb A1c MFr Bld: 6.3 % (ref 4.6–6.5)

## 2015-08-01 LAB — VITAMIN D 25 HYDROXY (VIT D DEFICIENCY, FRACTURES): VITD: 23.77 ng/mL — ABNORMAL LOW (ref 30.00–100.00)

## 2015-08-01 LAB — MICROALBUMIN / CREATININE URINE RATIO
Creatinine,U: 188.5 mg/dL
Microalb Creat Ratio: 0.7 mg/g (ref 0.0–30.0)
Microalb, Ur: 1.4 mg/dL (ref 0.0–1.9)

## 2015-08-01 MED ORDER — CYCLOSPORINE 0.05 % OP EMUL: OPHTHALMIC | Status: DC

## 2015-08-01 MED ORDER — LEVOTHYROXINE SODIUM 200 MCG PO TABS: 200.0000 ug | ORAL_TABLET | Freq: Every day | ORAL | Status: DC

## 2015-08-01 MED ORDER — OXYCODONE-ACETAMINOPHEN 10-325 MG PO TABS: 1.0000 | ORAL_TABLET | ORAL | Status: DC | PRN

## 2015-08-01 MED ORDER — FUROSEMIDE 20 MG PO TABS: 20.0000 mg | ORAL_TABLET | Freq: Every day | ORAL | Status: DC | PRN

## 2015-08-01 MED ORDER — CELECOXIB 200 MG PO CAPS: 200.0000 mg | ORAL_CAPSULE | Freq: Two times a day (BID) | ORAL | Status: DC

## 2015-08-01 NOTE — Progress Notes (Signed)
Subjective:  Patient ID: Bonnie Hinton, female    DOB: October 05, 1940  Age: 75 y.o. 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 Diagnoses of Vitamin D deficiency, Hyperlipidemia, and Primary osteoarthritis of right knee were also pertinent to this visit.  HPI Jaydyn Menon presents for follow up on chronic issues including  Type 2 DM, with CKD and obesity.  She is  very upset about a bill she received for her last office visit .   Lab Results  Component Value Date   HGBA1C 6.3 08/01/2015  June 12th is her scheduled right  Total Knee replacement .  Has had the preoperative evaluation   Has discontinued the oxycodone in March but has continued taking 2 percocet the percocet on a daily basis.  She is taking tramadol and has resumed the celebrex  .  Has been having itching since she resumed taking Vit D3 for the last several weeks   Checking BS in the morning fasting 110 to 130.  Had a low BS from skipping a meal which caused her to fall down,  Had to call 911.   Outpatient Prescriptions Prior to Visit  Medication Sig Dispense Refill  . amLODipine (NORVASC) 5 MG tablet Take 1 tablet (5 mg total) by mouth daily. 90 tablet 3  . aspirin EC 81 MG tablet Take 81 mg by mouth.    Marland Kitchen b complex vitamins capsule Take by mouth.    . diclofenac sodium (VOLTAREN) 1 % GEL Apply 2 g topically 4 (four) times daily.     Marland Kitchen estradiol (ESTRACE) 1 MG tablet Take 1 tablet (1 mg total) by mouth daily. 90 tablet 3  . ezetimibe (ZETIA) 10 MG tablet Take 1 tablet (10 mg total) by mouth daily. 90 tablet 3  . fluticasone (FLONASE) 50 MCG/ACT nasal spray 1 spray by Each Nare route daily.    Marland Kitchen glucose blood test strip Use to test blood sugar once daily .  E11.22 100 each 0  . Liraglutide 18 MG/3ML SOPN Inject 0.6 mg into the skin daily.     . Omega-3 1000 MG CAPS Take 1 capsule by mouth daily.     Marland Kitchen oxyCODONE-acetaminophen (PERCOCET) 10-325 MG tablet Take 1  tablet by mouth every 4 (four) hours as needed. 180 tablet 0  . oxyCODONE-acetaminophen (PERCOCET) 10-325 MG tablet Take 1 tablet by mouth every 4 (four) hours as needed. 180 tablet 0  . pantoprazole (PROTONIX) 40 MG tablet Take 1 tablet (40 mg total) by mouth daily. 90 tablet 3  . rosuvastatin (CRESTOR) 20 MG tablet Take 1 tablet (20 mg total) by mouth daily. 90 tablet 1  . tolterodine (DETROL LA) 4 MG 24 hr capsule Take 1 capsule (4 mg total) by mouth 2 (two) times daily. 180 capsule 1  . celecoxib (CELEBREX) 200 MG capsule Take 200 mg by mouth 2 (two) times daily.     . cycloSPORINE (RESTASIS) 0.05 % ophthalmic emulsion 1 drop Two (2) times a day.    . furosemide (LASIX) 20 MG tablet Take 20 mg by mouth daily as needed.     Marland Kitchen levothyroxine (SYNTHROID, LEVOTHROID) 200 MCG tablet Take 1 tablet (200 mcg total) by mouth daily before breakfast. 90 tablet 1  . oxyCODONE-acetaminophen (PERCOCET) 10-325 MG tablet Take 1 tablet by mouth every 4 (four) hours as needed. 180 tablet 0  . Vitamin D, Ergocalciferol, (DRISDOL) 50000 units CAPS capsule TAKE 1 CAPSULE (50,000 UNITS TOTAL) BY MOUTH  ONCE A WEEK. 12 capsule 0  . oxyCODONE 30 MG 12 hr tablet Take 1 tablet by mouth daily. Every 12 hours (Patient not taking: Reported on 08/01/2015) 60 each 0   No facility-administered medications prior to visit.    Review of Systems;  Patient denies headache, fevers, malaise, unintentional weight loss, skin rash, eye pain, sinus congestion and sinus pain, sore throat, dysphagia,  hemoptysis , cough, dyspnea, wheezing, chest pain, palpitations, orthopnea, edema, abdominal pain, nausea, melena, diarrhea, constipation, flank pain, dysuria, hematuria, urinary  Frequency, nocturia, numbness, tingling, seizures,  Focal weakness, Loss of consciousness,  Tremor, insomnia, depression, anxiety, and suicidal ideation.      Objective:  BP 110/60 mmHg  Pulse 103  Temp(Src) 97.5 F (36.4 C) (Oral)  Resp 12  Ht 5\' 6"  (1.676  m)  Wt 300 lb (136.079 kg)  BMI 48.44 kg/m2  SpO2 96%  BP Readings from Last 3 Encounters:  08/01/15 110/60  07/25/15 130/64  06/22/15 154/82    Wt Readings from Last 3 Encounters:  08/01/15 300 lb (136.079 kg)  07/25/15 296 lb (134.265 kg)  06/22/15 296 lb 12 oz (134.605 kg)    General appearance: alert, cooperative and appears stated age Ears: normal TM's and external ear canals both ears Throat: lips, mucosa, and tongue normal; teeth and gums normal Neck: no adenopathy, no carotid bruit, supple, symmetrical, trachea midline and thyroid not enlarged, symmetric, no tenderness/mass/nodules Back: symmetric, no curvature. ROM normal. No CVA tenderness. Lungs: clear to auscultation bilaterally Heart: regular rate and rhythm, S1, S2 normal, no murmur, click, rub or gallop Abdomen: soft, non-tender; bowel sounds normal; no masses,  no organomegaly Pulses: 2+ and symmetric Skin: Skin color, texture, turgor normal. No rashes or lesions Lymph nodes: Cervical, supraclavicular, and axillary nodes normal.  Lab Results  Component Value Date   HGBA1C 6.3 08/01/2015   HGBA1C 6.0 04/11/2015   HGBA1C 6.8* 12/28/2014    Lab Results  Component Value Date   CREATININE 0.82 08/01/2015   CREATININE 0.93 05/25/2015   CREATININE 0.97 05/12/2015    Lab Results  Component Value Date   WBC 6.3 12/28/2014   HGB 12.5 12/28/2014   HCT 40 12/28/2014   PLT 322 12/28/2014   GLUCOSE 101* 08/01/2015   CHOL 135 08/01/2015   TRIG 120.0 08/01/2015   HDL 58.80 08/01/2015   LDLDIRECT 56.0 08/01/2015   LDLCALC 52 08/01/2015   ALT 54* 08/01/2015   AST 23 08/01/2015   NA 139 08/01/2015   K 4.4 08/01/2015   CL 100 08/01/2015   CREATININE 0.82 08/01/2015   BUN 14 08/01/2015   CO2 29 08/01/2015   TSH 1.53 04/11/2015   HGBA1C 6.3 08/01/2015   MICROALBUR 1.4 08/01/2015    Nm Myocar Multi W/spect W/wall Motion / Ef  06/29/2015  Pharmacological myocardial perfusion imaging study with no  significant  ischemia Normal wall motion, EF estimated at 60% No EKG changes concerning for ischemia at peak stress or in recovery. Low risk scan Signed, Dossie Arbour, MD, Ph.D [facility]    Assessment & Plan:   Problem List Items Addressed This Visit    Arthritis of knee, degenerative    Previously managed with celebrex , oxycontin, percocet and diclofenac gel.  She has stopped the oxycontin preoperatively and is requesting refill on percocet only.  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  hemoglobin A1c is at goal of less than 7.0 . Patient is reminded to schedule an annual eye exam and foot exam is normal today. Patient has no microalbuminuria. Patient is tolerating Crestor for CAD risk reduction but had hyperkalemia on ACE/ARB for renal protection and hypertension   Lab Results  Component Value Date   HGBA1C 6.3 08/01/2015   Lab Results  Component Value Date   MICROALBUR 1.4 08/01/2015           Relevant Medications   furosemide (LASIX) 20 MG tablet   Other Relevant Orders   Comprehensive metabolic panel (Completed)   Hemoglobin A1c (Completed)   Lipid panel (Completed)   Microalbumin / creatinine urine ratio (Completed)   Vitamin D deficiency    Not tolerating Drisdol due to itching when taking the megadose.  Repeat level is low .  Will recommend taking 5,000 Ius daily of D3       Relevant Orders   VITAMIN D 25 Hydroxy (Vit-D Deficiency, Fractures) (Completed)   Hyperlipidemia   Relevant Medications   furosemide (LASIX) 20 MG tablet   Other Relevant Orders   LDL cholesterol, direct (Completed)     A total of 25 minutes of face to face time was spent with patient more than half of which was spent in counselling about  the above mentioned conditions  and coordination of care  I have changed Ms. Stemler's furosemide and celecoxib. I am also having her maintain her aspirin EC, b complex vitamins, diclofenac sodium, fluticasone, Liraglutide, Omega-3, estradiol, ezetimibe, pantoprazole, rosuvastatin, amLODipine, tolterodine, oxyCODONE, oxyCODONE-acetaminophen, oxyCODONE-acetaminophen, glucose blood, oxyCODONE-acetaminophen, cycloSPORINE, and levothyroxine.  Meds ordered this encounter  Medications  . oxyCODONE-acetaminophen (PERCOCET) 10-325 MG tablet    Sig: Take 1 tablet by mouth every 4 (four) hours as needed.    Dispense:  180 tablet    Refill:  0    May refill on or after Jul 24 2015  . cycloSPORINE (RESTASIS) 0.05 % ophthalmic emulsion    Sig: 1 drop Two (2) times a day.    Dispense:  90 each    Refill:  1  . furosemide (LASIX) 20 MG tablet    Sig: Take 1 tablet (20 mg total) by mouth daily as needed for fluid or edema.    Dispense:  90 tablet    Refill:  1  . levothyroxine (SYNTHROID, LEVOTHROID) 200 MCG tablet    Sig: Take 1 tablet (200 mcg total) by mouth daily before breakfast.    Dispense:  90 tablet    Refill:  1  . celecoxib (CELEBREX) 200 MG capsule    Sig: Take 1 capsule (200 mg total) by mouth 2 (two) times daily.    Dispense:  180 capsule    Refill:  1    Medications Discontinued During This Encounter  Medication Reason  . oxyCODONE-acetaminophen (PERCOCET) 10-325 MG tablet Reorder  . cycloSPORINE (RESTASIS) 0.05 % ophthalmic emulsion Reorder  . furosemide (LASIX) 20 MG tablet Reorder  . levothyroxine (SYNTHROID, LEVOTHROID) 200 MCG tablet Reorder  . celecoxib (CELEBREX) 200 MG capsule Reorder    Follow-up: Return in about 3 months (around 11/01/2015) for follow up diabetes.   Sherlene ShamsULLO, Heinz Eckert L, MD

## 2015-08-01 NOTE — Patient Instructions (Addendum)
I have refilled the Percocet for one more month .  Please do not use more than 6 tablets per month  We will check your Vitamin D level and treat using the OTC pills once I  Have seen your vit d level

## 2015-08-01 NOTE — Progress Notes (Signed)
Pre-visit discussion using our clinic review tool. No additional management support is needed unless otherwise documented below in the visit note.  

## 2015-08-02 ENCOUNTER — Telehealth: Payer: Self-pay | Admitting: Internal Medicine

## 2015-08-02 ENCOUNTER — Encounter: Payer: Self-pay | Admitting: Internal Medicine

## 2015-08-02 ENCOUNTER — Other Ambulatory Visit: Payer: Self-pay | Admitting: Internal Medicine

## 2015-08-02 DIAGNOSIS — R748 Abnormal levels of other serum enzymes: Secondary | ICD-10-CM

## 2015-08-02 MED ORDER — VITAMIN D (ERGOCALCIFEROL) 1.25 MG (50000 UNIT) PO CAPS: ORAL_CAPSULE | ORAL | Status: DC

## 2015-08-02 NOTE — Assessment & Plan Note (Signed)
Not tolerating Drisdol due to itching when taking the megadose.  Repeat level is low .  Will recommend taking 5,000 Ius daily of D3

## 2015-08-02 NOTE — Progress Notes (Unsigned)
I cannot find note any where.

## 2015-08-02 NOTE — Assessment & Plan Note (Signed)
Currently well-controlled on current medications .  hemoglobin A1c is at goal of less than 7.0 . Patient is reminded to schedule an annual eye exam and foot exam is normal today. Patient has no microalbuminuria. Patient is tolerating Crestor for CAD risk reduction but had hyperkalemia on ACE/ARB for renal protection and hypertension   Lab Results  Component Value Date   HGBA1C 6.3 08/01/2015   Lab Results  Component Value Date   MICROALBUR 1.4 08/01/2015

## 2015-08-02 NOTE — Telephone Encounter (Signed)
Disregard previous message about Drisdol,  Patient has reported itching with previous trial of Drisdol  Tell he rto take 5,000 units of OTC D3 daily instead.

## 2015-08-02 NOTE — Assessment & Plan Note (Signed)
Previously managed with celebrex , oxycontin, percocet and diclofenac gel.  She has stopped the oxycontin preoperatively and is requesting refill on percocet only.  She is anxious to discontinue all narcotics post operatively

## 2015-08-04 ENCOUNTER — Other Ambulatory Visit: Payer: Self-pay

## 2015-08-15 ENCOUNTER — Encounter: Payer: Self-pay | Admitting: Physician Assistant

## 2015-08-15 ENCOUNTER — Other Ambulatory Visit (HOSPITAL_COMMUNITY): Payer: Self-pay | Admitting: Orthopedic Surgery

## 2015-08-15 ENCOUNTER — Other Ambulatory Visit: Payer: Self-pay | Admitting: Physician Assistant

## 2015-08-15 DIAGNOSIS — Z96643 Presence of artificial hip joint, bilateral: Secondary | ICD-10-CM | POA: Insufficient documentation

## 2015-08-15 DIAGNOSIS — M48061 Spinal stenosis, lumbar region without neurogenic claudication: Secondary | ICD-10-CM | POA: Insufficient documentation

## 2015-08-15 DIAGNOSIS — M1990 Unspecified osteoarthritis, unspecified site: Secondary | ICD-10-CM

## 2015-08-15 DIAGNOSIS — M5441 Lumbago with sciatica, right side: Secondary | ICD-10-CM

## 2015-08-15 DIAGNOSIS — E079 Disorder of thyroid, unspecified: Secondary | ICD-10-CM

## 2015-08-15 DIAGNOSIS — M545 Low back pain: Secondary | ICD-10-CM

## 2015-08-15 DIAGNOSIS — N189 Chronic kidney disease, unspecified: Secondary | ICD-10-CM | POA: Insufficient documentation

## 2015-08-15 DIAGNOSIS — G8929 Other chronic pain: Secondary | ICD-10-CM

## 2015-08-15 DIAGNOSIS — E039 Hypothyroidism, unspecified: Secondary | ICD-10-CM | POA: Insufficient documentation

## 2015-08-15 DIAGNOSIS — N183 Chronic kidney disease, stage 3 unspecified: Secondary | ICD-10-CM

## 2015-08-15 DIAGNOSIS — E119 Type 2 diabetes mellitus without complications: Secondary | ICD-10-CM

## 2015-08-15 DIAGNOSIS — Z96652 Presence of left artificial knee joint: Secondary | ICD-10-CM

## 2015-08-15 DIAGNOSIS — Z8673 Personal history of transient ischemic attack (TIA), and cerebral infarction without residual deficits: Secondary | ICD-10-CM | POA: Insufficient documentation

## 2015-08-15 DIAGNOSIS — S065X9A Traumatic subdural hemorrhage with loss of consciousness of unspecified duration, initial encounter: Secondary | ICD-10-CM

## 2015-08-15 DIAGNOSIS — Z96651 Presence of right artificial knee joint: Secondary | ICD-10-CM | POA: Insufficient documentation

## 2015-08-15 DIAGNOSIS — S065XAA Traumatic subdural hemorrhage with loss of consciousness status unknown, initial encounter: Secondary | ICD-10-CM

## 2015-08-15 DIAGNOSIS — I10 Essential (primary) hypertension: Secondary | ICD-10-CM

## 2015-08-15 NOTE — H&P (Signed)
Bonnie Hinton is an 75 y.o. female.   Chief Complaint: low back with right leg radiculopathy HPI: 75 yowf with longstanding back pain for the past 8-10 years.  Last MRI and epidural steroid injections were done 6 years ago.  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  Currently she is more limited in her mobility due to her lumbar spine and her right leg radiculopathy than her djd in her right knee.  Ordering a lumbar spine MRI to set her up with lumbar epidural and physical therapy to get her radicular symptoms under control and improve her mobility.  She is currently unable to ambulate without a walker.  Past Medical History  Diagnosis Date  . Arthritis   . Diabetes mellitus without complication (HCC)     type 2  . Hypertension   . Thyroid disease   . Chronic kidney disease   . Subdural hematoma (HCC)     w/ Hemorhagic stroke  . Status post bilateral hip replacements   . Status post left knee replacement   . Chronic low back pain with right-sided sciatica     Past Surgical History  Procedure Laterality Date  . Total hip arthroplasty Right   . Abdominal hysterectomy    . Total hip arthroplasty Left   . Total knee arthroplasty Left     Family History  Problem Relation Age of Onset  . Cancer Father   . Cancer Brother   . Diabetes Maternal Aunt    Social History:  reports that she has never smoked. She has never used smokeless tobacco. She reports that she does not drink alcohol or use illicit drugs.  Allergies:  Allergies  Allergen Reactions  . Erythromycin Rash  . Iodinated Diagnostic Agents Rash and Swelling  . Fentanyl     Duragesic-25  . Latex   . Lisinopril Other (See Comments)    Hyperkalemia      Current outpatient prescriptions:  .  amLODipine (NORVASC) 5 MG tablet, Take 1 tablet (5 mg total) by mouth daily., Disp: 90 tablet, Rfl: 3 .  aspirin EC 81 MG tablet, Take 81 mg by mouth., Disp: , Rfl:  .  b complex vitamins capsule, Take  by mouth., Disp: , Rfl:  .  celecoxib (CELEBREX) 200 MG capsule, Take 1 capsule (200 mg total) by mouth 2 (two) times daily., Disp: 180 capsule, Rfl: 1 .  cycloSPORINE (RESTASIS) 0.05 % ophthalmic emulsion, 1 drop Two (2) times a day., Disp: 90 each, Rfl: 1 .  diclofenac sodium (VOLTAREN) 1 % GEL, Apply 2 g topically 4 (four) times daily. , Disp: , Rfl:  .  estradiol (ESTRACE) 1 MG tablet, Take 1 tablet (1 mg total) by mouth daily., Disp: 90 tablet, Rfl: 3 .  ezetimibe (ZETIA) 10 MG tablet, Take 1 tablet (10 mg total) by mouth daily., Disp: 90 tablet, Rfl: 3 .  fluticasone (FLONASE) 50 MCG/ACT nasal spray, 1 spray by Each Nare route daily., Disp: , Rfl:  .  furosemide (LASIX) 20 MG tablet, Take 1 tablet (20 mg total) by mouth daily as needed for fluid or edema., Disp: 90 tablet, Rfl: 1 .  glucose blood test strip, Use to test blood sugar once daily .  E11.22, Disp: 100 each, Rfl: 0 .  levothyroxine (SYNTHROID, LEVOTHROID) 200 MCG tablet, Take 1 tablet (200 mcg total) by mouth daily before breakfast., Disp: 90 tablet, Rfl: 1 .  Liraglutide 18 MG/3ML SOPN, Inject 0.6 mg into the skin daily. ,   Disp: , Rfl:  .  Omega-3 1000 MG CAPS, Take 1 capsule by mouth daily. , Disp: , Rfl:  .  oxyCODONE 30 MG 12 hr tablet, Take 1 tablet by mouth daily. Every 12 hours (Patient not taking: Reported on 08/01/2015), Disp: 60 each, Rfl: 0 .  oxyCODONE-acetaminophen (PERCOCET) 10-325 MG tablet, Take 1 tablet by mouth every 4 (four) hours as needed., Disp: 180 tablet, Rfl: 0 .  oxyCODONE-acetaminophen (PERCOCET) 10-325 MG tablet, Take 1 tablet by mouth every 4 (four) hours as needed., Disp: 180 tablet, Rfl: 0 .  oxyCODONE-acetaminophen (PERCOCET) 10-325 MG tablet, Take 1 tablet by mouth every 4 (four) hours as needed., Disp: 180 tablet, Rfl: 0 .  pantoprazole (PROTONIX) 40 MG tablet, Take 1 tablet (40 mg total) by mouth daily., Disp: 90 tablet, Rfl: 3 .  rosuvastatin (CRESTOR) 20 MG tablet, Take 1 tablet (20 mg total) by  mouth daily., Disp: 90 tablet, Rfl: 1 .  tolterodine (DETROL LA) 4 MG 24 hr capsule, Take 1 capsule (4 mg total) by mouth 2 (two) times daily., Disp: 180 capsule, Rfl: 1 .  Vitamin D, Ergocalciferol, (DRISDOL) 50000 units CAPS capsule, TAKE 1 CAPSULE (50,000 UNITS TOTAL) BY MOUTH ONCE A WEEK., Disp: 12 capsule, Rfl: 0  (Not in a hospital admission)  Review of Systems  Constitutional: Negative.   Eyes: Negative.   Respiratory: Negative.   Cardiovascular: Positive for leg swelling. Negative for chest pain, palpitations, orthopnea, claudication and PND.  Gastrointestinal: Positive for constipation. Negative for heartburn, nausea, vomiting, abdominal pain, diarrhea, blood in stool and melena.  Genitourinary: Negative.   Musculoskeletal: Positive for myalgias, back pain, joint pain, falls and neck pain.  Neurological: Negative.   Endo/Heme/Allergies: Negative.   Psychiatric/Behavioral: Negative.     Blood pressure 148/80, pulse 88, temperature 98.2 F (36.8 C), height 5' 6" (1.676 m), weight 133.358 kg (294 lb), SpO2 95 %. Physical Exam  Constitutional: She is oriented to person, place, and time. She appears well-developed and well-nourished.  HENT:  Head: Normocephalic and atraumatic.  Mouth/Throat: Oropharynx is clear and moist.  Eyes: Conjunctivae are normal. Pupils are equal, round, and reactive to light.  Neck: Neck supple.  Decreased ROM  Cardiovascular: Normal rate, regular rhythm and normal heart sounds.   Respiratory: Effort normal and breath sounds normal.  GI: Soft.  Genitourinary:  Not pertinent to current symptomatology therefore not examined.  Musculoskeletal:  This patient is independently ambulatory with lots of verbal encouragement and the assistance of a walker.  She has used a walker for ambulation for last 6 years for her chronic back pain.  She has had an acute onset of right leg radiculopathy this week.  Ambulation has significantly declined since this episode.   Today she has gross motor weakness of her right leg due to pain.  No sensory deficit.  3+ edema in her feet.  Neurological: She is alert and oriented to person, place, and time.  Skin: Skin is warm.  Psychiatric: She has a normal mood and affect. Her behavior is normal.     Assessment Past Medical History  Diagnosis Date  . Arthritis   . Diabetes mellitus without complication (HCC)     type 2  . Hypertension   . Thyroid disease   . Chronic kidney disease   . Subdural hematoma (HCC)     w/ Hemorhagic stroke  . Status post bilateral hip replacements   . Status post left knee replacement   . Chronic low back pain with right-sided sciatica       Plan MRI lumbar spine under general anesthesia.  Due to patient's unremitting, chronic back pain with acute radiculopathy this patient is unable to lay flat and still for an MRI without general anethesia.    Emrey Thornley J, PA-C 08/15/2015, 10:28 AM 

## 2015-08-15 NOTE — H&P (Signed)
Bonnie Hinton is an 75 y.o. female.   Chief Complaint: low back with right leg radiculopathy HPI: [id number] yowf with longstanding back pain for the past 8-10 years.  Last MRI and epidural steroid injections were done 6 years ago.  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  Currently she is more limited in her mobility due to her lumbar spine and her right leg radiculopathy than her djd in her right knee.  Ordering a lumbar spine MRI to set her up with lumbar epidural and physical therapy to get her radicular symptoms under control and improve her mobility.  She is currently unable to ambulate without a walker.  Past Medical History  Diagnosis Date  . Arthritis   . Diabetes mellitus without complication (HCC)     type 2  . Hypertension   . Thyroid disease   . Chronic kidney disease   . Subdural hematoma (HCC)     w/ Hemorhagic stroke  . Status post bilateral hip replacements   . Status post left knee replacement   . Chronic low back pain with right-sided sciatica     Past Surgical History  Procedure Laterality Date  . Total hip arthroplasty Right   . Abdominal hysterectomy    . Total hip arthroplasty Left   . Total knee arthroplasty Left     Family History  Problem Relation Age of Onset  . Cancer Father   . Cancer Brother   . Diabetes Maternal Aunt    Social History:  reports that she has never smoked. She has never used smokeless tobacco. She reports that she does not drink alcohol or use illicit drugs.  Allergies:  Allergies  Allergen Reactions  . Erythromycin Rash  . Iodinated Diagnostic Agents Rash and Swelling  . Fentanyl     Duragesic-25  . Latex   . Lisinopril Other (See Comments)    Hyperkalemia      Current outpatient prescriptions:  .  amLODipine (NORVASC) 5 MG tablet, Take 1 tablet (5 mg total) by mouth daily., Disp: 90 tablet, Rfl: 3 .  aspirin EC 81 MG tablet, Take 81 mg by mouth., Disp: , Rfl:  .  b complex vitamins capsule, Take  by mouth., Disp: , Rfl:  .  celecoxib (CELEBREX) 200 MG capsule, Take 1 capsule (200 mg total) by mouth 2 (two) times daily., Disp: 180 capsule, Rfl: 1 .  cycloSPORINE (RESTASIS) 0.05 % ophthalmic emulsion, 1 drop Two (2) times a day., Disp: 90 each, Rfl: 1 .  diclofenac sodium (VOLTAREN) 1 % GEL, Apply 2 g topically 4 (four) times daily. , Disp: , Rfl:  .  estradiol (ESTRACE) 1 MG tablet, Take 1 tablet (1 mg total) by mouth daily., Disp: 90 tablet, Rfl: 3 .  ezetimibe (ZETIA) 10 MG tablet, Take 1 tablet (10 mg total) by mouth daily., Disp: 90 tablet, Rfl: 3 .  fluticasone (FLONASE) 50 MCG/ACT nasal spray, 1 spray by Each Nare route daily., Disp: , Rfl:  .  furosemide (LASIX) 20 MG tablet, Take 1 tablet (20 mg total) by mouth daily as needed for fluid or edema., Disp: 90 tablet, Rfl: 1 .  glucose blood test strip, Use to test blood sugar once daily .  E11.22, Disp: 100 each, Rfl: 0 .  levothyroxine (SYNTHROID, LEVOTHROID) 200 MCG tablet, Take 1 tablet (200 mcg total) by mouth daily before breakfast., Disp: 90 tablet, Rfl: 1 .  Liraglutide 18 MG/3ML SOPN, Inject 0.6 mg into the skin daily. ,  Disp: , Rfl:  .  Omega-3 1000 MG CAPS, Take 1 capsule by mouth daily. , Disp: , Rfl:  .  oxyCODONE 30 MG 12 hr tablet, Take 1 tablet by mouth daily. Every 12 hours (Patient not taking: Reported on 08/01/2015), Disp: 60 each, Rfl: 0 .  oxyCODONE-acetaminophen (PERCOCET) 10-325 MG tablet, Take 1 tablet by mouth every 4 (four) hours as needed., Disp: 180 tablet, Rfl: 0 .  oxyCODONE-acetaminophen (PERCOCET) 10-325 MG tablet, Take 1 tablet by mouth every 4 (four) hours as needed., Disp: 180 tablet, Rfl: 0 .  oxyCODONE-acetaminophen (PERCOCET) 10-325 MG tablet, Take 1 tablet by mouth every 4 (four) hours as needed., Disp: 180 tablet, Rfl: 0 .  pantoprazole (PROTONIX) 40 MG tablet, Take 1 tablet (40 mg total) by mouth daily., Disp: 90 tablet, Rfl: 3 .  rosuvastatin (CRESTOR) 20 MG tablet, Take 1 tablet (20 mg total) by  mouth daily., Disp: 90 tablet, Rfl: 1 .  tolterodine (DETROL LA) 4 MG 24 hr capsule, Take 1 capsule (4 mg total) by mouth 2 (two) times daily., Disp: 180 capsule, Rfl: 1 .  Vitamin D, Ergocalciferol, (DRISDOL) 50000 units CAPS capsule, TAKE 1 CAPSULE (50,000 UNITS TOTAL) BY MOUTH ONCE A WEEK., Disp: 12 capsule, Rfl: 0  (Not in a hospital admission)  Review of Systems  Constitutional: Negative.   Eyes: Negative.   Respiratory: Negative.   Cardiovascular: Positive for leg swelling. Negative for chest pain, palpitations, orthopnea, claudication and PND.  Gastrointestinal: Positive for constipation. Negative for heartburn, nausea, vomiting, abdominal pain, diarrhea, blood in stool and melena.  Genitourinary: Negative.   Musculoskeletal: Positive for myalgias, back pain, joint pain, falls and neck pain.  Neurological: Negative.   Endo/Heme/Allergies: Negative.   Psychiatric/Behavioral: Negative.     Blood pressure 148/80, pulse 88, temperature 98.2 F (36.8 C), height 5\' 6"  (1.676 m), weight 133.358 kg (294 lb), SpO2 95 %. Physical Exam  Constitutional: She is oriented to person, place, and time. She appears well-developed and well-nourished.  HENT:  Head: Normocephalic and atraumatic.  Mouth/Throat: Oropharynx is clear and moist.  Eyes: Conjunctivae are normal. Pupils are equal, round, and reactive to light.  Neck: Neck supple.  Decreased ROM  Cardiovascular: Normal rate, regular rhythm and normal heart sounds.   Respiratory: Effort normal and breath sounds normal.  GI: Soft.  Genitourinary:  Not pertinent to current symptomatology therefore not examined.  Musculoskeletal:  This patient is independently ambulatory with lots of verbal encouragement and the assistance of a walker.  She has used a walker for ambulation for last 6 years for her chronic back pain.  She has had an acute onset of right leg radiculopathy this week.  Ambulation has significantly declined since this episode.   Today she has gross motor weakness of her right leg due to pain.  No sensory deficit.  3+ edema in her feet.  Neurological: She is alert and oriented to person, place, and time.  Skin: Skin is warm.  Psychiatric: She has a normal mood and affect. Her behavior is normal.     Assessment Past Medical History  Diagnosis Date  . Arthritis   . Diabetes mellitus without complication (HCC)     type 2  . Hypertension   . Thyroid disease   . Chronic kidney disease   . Subdural hematoma (HCC)     w/ Hemorhagic stroke  . Status post bilateral hip replacements   . Status post left knee replacement   . Chronic low back pain with right-sided sciatica  Plan MRI lumbar spine under general anesthesia.  Due to patient's unremitting, chronic back pain with acute radiculopathy this patient is unable to lay flat and still for an MRI without general anethesia.    Pascal Lux, PA-C 08/15/2015, 10:28 AM

## 2015-08-17 ENCOUNTER — Telehealth: Payer: Self-pay | Admitting: Internal Medicine

## 2015-08-17 ENCOUNTER — Encounter (HOSPITAL_COMMUNITY)
Admission: RE | Admit: 2015-08-17 | Discharge: 2015-08-17 | Disposition: A | Discharge status: Home / Self Care | Payer: Medicare Other | Source: Ambulatory Visit | Attending: Orthopedic Surgery | Admitting: Orthopedic Surgery

## 2015-08-17 DIAGNOSIS — Z96652 Presence of left artificial knee joint: Secondary | ICD-10-CM | POA: Diagnosis not present

## 2015-08-17 DIAGNOSIS — I129 Hypertensive chronic kidney disease with stage 1 through stage 4 chronic kidney disease, or unspecified chronic kidney disease: Secondary | ICD-10-CM | POA: Diagnosis not present

## 2015-08-17 DIAGNOSIS — E1140 Type 2 diabetes mellitus with diabetic neuropathy, unspecified: Secondary | ICD-10-CM | POA: Insufficient documentation

## 2015-08-17 DIAGNOSIS — M1711 Unilateral primary osteoarthritis, right knee: Secondary | ICD-10-CM

## 2015-08-17 DIAGNOSIS — Z79899 Other long term (current) drug therapy: Secondary | ICD-10-CM | POA: Insufficient documentation

## 2015-08-17 DIAGNOSIS — K219 Gastro-esophageal reflux disease without esophagitis: Secondary | ICD-10-CM | POA: Insufficient documentation

## 2015-08-17 DIAGNOSIS — Z96643 Presence of artificial hip joint, bilateral: Secondary | ICD-10-CM | POA: Insufficient documentation

## 2015-08-17 DIAGNOSIS — Z8673 Personal history of transient ischemic attack (TIA), and cerebral infarction without residual deficits: Secondary | ICD-10-CM | POA: Insufficient documentation

## 2015-08-17 DIAGNOSIS — N189 Chronic kidney disease, unspecified: Secondary | ICD-10-CM | POA: Diagnosis not present

## 2015-08-17 DIAGNOSIS — E1122 Type 2 diabetes mellitus with diabetic chronic kidney disease: Secondary | ICD-10-CM | POA: Diagnosis not present

## 2015-08-17 DIAGNOSIS — K760 Fatty (change of) liver, not elsewhere classified: Secondary | ICD-10-CM | POA: Diagnosis not present

## 2015-08-17 DIAGNOSIS — Z01818 Encounter for other preprocedural examination: Secondary | ICD-10-CM | POA: Insufficient documentation

## 2015-08-17 DIAGNOSIS — Z7982 Long term (current) use of aspirin: Secondary | ICD-10-CM | POA: Insufficient documentation

## 2015-08-17 DIAGNOSIS — Z7984 Long term (current) use of oral hypoglycemic drugs: Secondary | ICD-10-CM | POA: Insufficient documentation

## 2015-08-17 LAB — BASIC METABOLIC PANEL
Anion gap: 12 (ref 5–15)
BUN: 13 mg/dL (ref 6–20)
CO2: 26 mmol/L (ref 22–32)
Calcium: 9.6 mg/dL (ref 8.9–10.3)
Chloride: 100 mmol/L — ABNORMAL LOW (ref 101–111)
Creatinine, Ser: 0.89 mg/dL (ref 0.44–1.00)
GFR calc Af Amer: >60 mL/min (ref >60)
GFR calc non Af Amer: >60 mL/min (ref >60)
Glucose, Bld: 134 mg/dL — ABNORMAL HIGH (ref 65–99)
Potassium: 3.7 mmol/L (ref 3.5–5.1)
Sodium: 138 mmol/L (ref 135–145)

## 2015-08-17 LAB — CBC
HCT: 42.3 % (ref 36.0–46.0)
Hemoglobin: 13.8 g/dL (ref 12.0–15.0)
MCH: 28.8 pg (ref 26.0–34.0)
MCHC: 32.6 g/dL (ref 30.0–36.0)
MCV: 88.1 fL (ref 78.0–100.0)
Platelets: 326 10*3/uL (ref 150–400)
RBC: 4.8 MIL/uL (ref 3.87–5.11)
RDW: 13.4 % (ref 11.5–15.5)
WBC: 11.5 10*3/uL — ABNORMAL HIGH (ref 4.0–10.5)

## 2015-08-17 NOTE — Telephone Encounter (Signed)
Caller name: Arline AspCindy Relationship to patient: wound rn Can be reached: [phone]  Reason for call: Cindy-Wound RN with [facility] [employer] called because the pateints daughter [name] had been trying to fax an order to them for an Extra [employer] Walker with skis. Arline AspCindy needs the order faxed to [phone]. Please advise

## 2015-08-17 NOTE — Telephone Encounter (Signed)
Please clarify, do you mean extra Large rolling walker with ski's ?

## 2015-08-18 ENCOUNTER — Encounter (HOSPITAL_COMMUNITY): Payer: Self-pay | Admitting: *Deleted

## 2015-08-18 NOTE — Progress Notes (Signed)
Anesthesia Chart Review: Patient is a 75 year old female scheduled for MRI of the L-spine with anesthesia on 08/22/15. MRI was ordered by Dr. Salvatore Marvelobert Wainer with H&P completed on 08/15/15 by Julien GirtKirstin Shepperson, PA-C. Patient is having low back pain with right leg radiculopathy (started on prednisone taper 08/15/15).   History includes non-smoker, arthritis, DM2, neuropathy, HTN, SDH with hemorrhagic CVA '09, CKD, fatty liver, GERD, dry eyes, anxiety, hysterectomy, bilateral THA, left TKA. BMI is consistent with obesity. OSA screening score is 5.   PCP is Dr. Darrick Huntsmanullo. Patient was recently referred to cardiologist Dr. Almond LintAileen Ingal with [facility] on 07/04/15 for preoperative clearance for future right TKR. A stress and echo were ordered (see below). Based on results, Dr. Alvino ChapelIngal thought patient was intermediate risk for moderate risk procedure. No further cardiac work-up recommended at her 07/25/15 follow-up visit.  Meds include amlodipine, ASA 81mg , Celebrex, Restasis, Estrace, Zetiz, Lasix, levothyroxine, Liraglutide, omega, Percocet, Protonix, Detrol XL, Crestor, prednisone (Sterapred 12 day po started 08/15/15).  06/22/15 EKG: NSR, poor r wave progression. Negative T wave in III, non-specific in aVF.  07/14/15 Echo: Study Conclusions - Left ventricle: The cavity size was normal. There was mild  concentric hypertrophy. Systolic function was normal. The  estimated ejection fraction was in the range of 55% to 60%. Wall  motion was normal; there were no regional wall motion  abnormalities. Left ventricular diastolic function parameters  were normal. - Left atrium: The atrium was mildly dilated.  06/30/15 Nuclear stress test: Pharmacological myocardial perfusion imaging study with no significant ischemia Normal wall motion, EF estimated at 60% No EKG changes concerning for ischemia at peak stress or in recovery. Low risk scan.  Pre-procedure labs noted. A1c on 08/01/15 was 6.3. However, patient is  currently taking a prednisone taper. Fasting CBG typically around 110, but was 202 this morning since on prednisone. She is on Victoza. She will get a fasting CBG on arrival. Her fasting CBGs on prednisone are elevated but if remain close to where they were this morning then I would anticipate that she could still have her MRI. She may end up needing SSI coverage. She will get a fasting CBG on arrival. Definitive plan based on those results and anesthesiology evaluation.  Velna Ochsllison Janyra Barillas, PA-C [facility]Anesthesiology Phone [phone] 08/18/2015 2:38 PM

## 2015-08-18 NOTE — Progress Notes (Addendum)
Pt recently saw Bonnie Hinton, cardiologist for cardiac clearance for upcoming knee surgery. No prior cardiac history. Clearance has been given. Spoke with pt's daughter, Bonnie Hinton for pre-op call. She states pt has not had any recent complaints of chest pain or sob. Pt came in to PAT for labs only yesterday, legs swollen (3+) pitting edema noted, daughter states that is due to her lack of exercise due to back pain and knee pain. Daughter states as soon as she left here yesterday and walked some, the ankle swelling decreased a great deal. Pt is diabetic, fasting blood sugar usually runs around 110, but she is now on high dose Prednisone and yesterday her fasting blood sugar was 202. Her A1C was 6.3 on 08/01/15. The prednisone has also increased her urinary urgency. Daughter states pt can be overly dramatic at times and tends to be very anxious. Instructed daughter for pt not to take her Victoza dose day of procedure. Instructed daughter to have pt check her blood sugar the morning of procedure. If blood sugar is 70 or below, treat with 1/2 cup of clear juice (apple or cranberry) and recheck blood sugar 15 minutes after drinking juice. If blood sugar continues to be 70 or below, call the Short Stay department and ask to speak to a nurse.

## 2015-08-18 NOTE — Progress Notes (Signed)
   08/18/15 1014  OBSTRUCTIVE SLEEP APNEA  Have you ever been diagnosed with sleep apnea through a sleep study? No  Do you snore loudly (loud enough to be heard through closed doors)?  1  Do you often feel tired, fatigued, or sleepy during the daytime (such as falling asleep during driving or talking to someone)? 1  Has anyone observed you stop breathing during your sleep? 0  Do you have, or are you being treated for high blood pressure? 1  BMI more than 35 kg/m2? 1  Age > 50 (1-yes) 1  Female Gender (Yes=1) 0  Obstructive Sleep Apnea Score 5  Score 5 or greater  Results sent to PCP

## 2015-08-21 NOTE — Anesthesia Preprocedure Evaluation (Addendum)
Anesthesia Evaluation  Patient identified by MRN, date of birth, ID band Patient awake    Reviewed: Allergy & Precautions, NPO status , Patient's Chart, lab work & pertinent test results  History of Anesthesia Complications (+) Emergence Delirium and history of anesthetic complications  Airway Mallampati: IV  TM Distance: <3 FB   Mouth opening: Limited Mouth Opening  Dental  (+) Dental Advisory Given, Loose, Poor Dentition, Missing, Caps,    Pulmonary shortness of breath and with exertion, pneumonia, resolved,    breath sounds clear to auscultation       Cardiovascular Exercise Tolerance: Poor hypertension, Pt. on medications  Rhythm:Regular Rate:Normal   LV EF: 55-60%   Neuro/Psych PSYCHIATRIC DISORDERS Anxiety  gross motor weakness of her right leg due to pain  CVA / Hemorrhagic stroke 2004 from fall  CVA, No Residual Symptoms    GI/Hepatic GERD  Medicated and Controlled,  Endo/Other  diabetes, Well Controlled, Type 2, Insulin DependentMorbid obesity  Renal/GU Renal InsufficiencyRenal disease     Musculoskeletal  (+) Arthritis , Osteoarthritis,    Abdominal (+) + obese,  Abdomen: soft. Bowel sounds: normal.  Peds  Hematology   Anesthesia Other Findings Chronic Prednisone use  unable to ambulate without a walker  Reproductive/Obstetrics                           BP Readings from Last 3 Encounters:  08/15/15 148/80  08/01/15 110/60  07/25/15 130/64   Lab Results  Component Value Date   WBC 11.5* 08/17/2015   HGB 13.8 08/17/2015   HCT 42.3 08/17/2015   MCV 88.1 08/17/2015   PLT 326 08/17/2015     Chemistry      Component Value Date/Time   NA 138 08/17/2015 1333   NA 137 12/28/2014   K 3.7 08/17/2015 1333   CL 100* 08/17/2015 1333   CO2 26 08/17/2015 1333   BUN 13 08/17/2015 1333   BUN 28* 12/28/2014   CREATININE 0.89 08/17/2015 1333   CREATININE 1.1 12/28/2014   GLU 100  12/28/2014      Component Value Date/Time   CALCIUM 9.6 08/17/2015 1333   ALKPHOS 137* 08/01/2015 1450   AST 23 08/01/2015 1450   ALT 54* 08/01/2015 1450   BILITOT 0.4 08/01/2015 1450     Lab Results  Component Value Date   HGBA1C 6.3 08/01/2015   Wt Readings from Last 3 Encounters:  08/15/15 294 lb (133.358 kg)  08/01/15 300 lb (136.079 kg)  07/25/15 296 lb (134.265 kg)   Echo 07/14/15 - Left ventricle: The cavity size was normal. There was mild concentric hypertrophy. Systolic function was normal. The estimated ejection fraction was in the range of 55% to 60%. Wall motion was normal; there were no regional wall motion abnormalities. Left ventricular diastolic function parameters were normal. - Left atrium: The atrium was mildly dilated.    Anesthesia Physical Anesthesia Plan  ASA: III  Anesthesia Plan: General   Post-op Pain Management:    Induction: Intravenous  Airway Management Planned: Oral ETT  Additional Equipment:   Intra-op Plan:   Post-operative Plan: Extubation in OR and Possible Post-op intubation/ventilation  Informed Consent: I have reviewed the patients History and Physical, chart, labs and discussed the procedure including the risks, benefits and alternatives for the proposed anesthesia with the patient or authorized representative who has indicated his/her understanding and acceptance.   Dental advisory given  Plan Discussed with: Anesthesiologist  Anesthesia Plan Comments:  Anesthesia Quick Evaluation  

## 2015-08-22 ENCOUNTER — Encounter (HOSPITAL_COMMUNITY)
Admission: RE | Disposition: A | Discharge status: Home / Self Care | Payer: Self-pay | Source: Ambulatory Visit | Attending: Physician Assistant

## 2015-08-22 ENCOUNTER — Encounter (HOSPITAL_COMMUNITY): Payer: Self-pay | Admitting: *Deleted

## 2015-08-22 ENCOUNTER — Ambulatory Visit (HOSPITAL_COMMUNITY): Payer: Medicare Other | Admitting: Certified Registered Nurse Anesthetist

## 2015-08-22 ENCOUNTER — Ambulatory Visit (HOSPITAL_COMMUNITY)
Admission: RE | Admit: 2015-08-22 | Discharge: 2015-08-22 | Disposition: A | Discharge status: Home / Self Care | Payer: Medicare Other | Source: Ambulatory Visit | Attending: Physician Assistant | Admitting: Physician Assistant

## 2015-08-22 ENCOUNTER — Ambulatory Visit (HOSPITAL_COMMUNITY): Payer: Medicare Other | Admitting: Vascular Surgery

## 2015-08-22 ENCOUNTER — Ambulatory Visit (HOSPITAL_COMMUNITY): Payer: Medicare Other

## 2015-08-22 ENCOUNTER — Ambulatory Visit (HOSPITAL_COMMUNITY)
Admission: RE | Admit: 2015-08-22 | Discharge: 2015-08-22 | Disposition: A | Discharge status: Home / Self Care | Payer: Medicare Other | Source: Ambulatory Visit | Attending: Orthopedic Surgery | Admitting: Orthopedic Surgery

## 2015-08-22 DIAGNOSIS — M79605 Pain in left leg: Secondary | ICD-10-CM | POA: Insufficient documentation

## 2015-08-22 DIAGNOSIS — M4806 Spinal stenosis, lumbar region: Secondary | ICD-10-CM | POA: Diagnosis not present

## 2015-08-22 DIAGNOSIS — M545 Low back pain: Secondary | ICD-10-CM | POA: Diagnosis present

## 2015-08-22 DIAGNOSIS — M1990 Unspecified osteoarthritis, unspecified site: Secondary | ICD-10-CM

## 2015-08-22 DIAGNOSIS — M5127 Other intervertebral disc displacement, lumbosacral region: Secondary | ICD-10-CM | POA: Diagnosis not present

## 2015-08-22 DIAGNOSIS — M79604 Pain in right leg: Secondary | ICD-10-CM | POA: Insufficient documentation

## 2015-08-22 HISTORY — DX: Pneumonia, unspecified organism: J18.9

## 2015-08-22 HISTORY — DX: Adverse effect of unspecified anesthetic, initial encounter: T41.45XA

## 2015-08-22 HISTORY — PX: RADIOLOGY WITH ANESTHESIA: SHX6223

## 2015-08-22 HISTORY — DX: Other complications of anesthesia, initial encounter: T88.59XA

## 2015-08-22 HISTORY — DX: Anxiety disorder, unspecified: F41.9

## 2015-08-22 HISTORY — DX: Polyneuropathy, unspecified: G62.9

## 2015-08-22 HISTORY — DX: Gastro-esophageal reflux disease without esophagitis: K21.9

## 2015-08-22 LAB — GLUCOSE, CAPILLARY
Glucose-Capillary: 106 mg/dL — ABNORMAL HIGH (ref 65–99)
Glucose-Capillary: 109 mg/dL — ABNORMAL HIGH (ref 65–99)

## 2015-08-22 SURGERY — RADIOLOGY WITH ANESTHESIA
Anesthesia: General | Site: Back

## 2015-08-22 MED ORDER — ONDANSETRON HCL 4 MG/2ML IJ SOLN: 4.0000 mg | Freq: Once | INTRAMUSCULAR | Status: DC | PRN

## 2015-08-22 MED ORDER — HYDROMORPHONE HCL 1 MG/ML IJ SOLN: 0.2500 mg | INTRAMUSCULAR | Status: DC | PRN

## 2015-08-22 MED ORDER — LACTATED RINGERS IV SOLN
INTRAVENOUS | Status: DC
  Administered 2015-08-22: 09:00:00 via INTRAVENOUS

## 2015-08-22 NOTE — Telephone Encounter (Signed)
Order printed for signature in quick sign. FYI

## 2015-08-22 NOTE — Anesthesia Postprocedure Evaluation (Signed)
Anesthesia Post Note  Patient: Rennie NatterElizabeth Joswick  Procedure(s) Performed: Procedure(s) (LRB): MRI OF LUMBAR SPINE   (RADIOLOGY WITH ANESTHESIA) (N/A)  Patient location during evaluation: PACU Anesthesia Type: General Level of consciousness: awake, awake and alert and oriented Pain management: pain level controlled Vital Signs Assessment: post-procedure vital signs reviewed and stable Respiratory status: spontaneous breathing, nonlabored ventilation and respiratory function stable Cardiovascular status: blood pressure returned to baseline Anesthetic complications: no    Last Vitals:  Filed Vitals:   08/22/15 1140 08/22/15 1152  BP: 137/66 141/70  Pulse: 92 86  Temp:    Resp: 18 18    Last Pain:  Filed Vitals:   08/22/15 1207  PainSc: 0-No pain                 Kaveri Perras COKER

## 2015-08-22 NOTE — Transfer of Care (Signed)
Immediate Anesthesia Transfer of Care Note  Patient: Bonnie Hinton  Procedure(s) Performed: Procedure(s): MRI OF LUMBAR SPINE   (RADIOLOGY WITH ANESTHESIA) (N/A)  Patient Location: PACU  Anesthesia Type:General  Level of Consciousness: awake, alert , oriented and patient cooperative  Airway & Oxygen Therapy: Patient Spontanous Breathing and Patient connected to face mask oxygen  Post-op Assessment: Report given to RN, Post -op Vital signs reviewed and stable and Patient moving all extremities X 4  Post vital signs: Reviewed and stable  Last Vitals:  Filed Vitals:   08/22/15 0831 08/22/15 1120  BP: 137/87 132/68  Pulse: 91 86  Temp: 36.8 C 36.4 C  Resp: 16 17    Last Pain:  Filed Vitals:   08/22/15 1125  PainSc: 5       Patients Stated Pain Goal: 7 (08/22/15 0824)  Complications: No apparent anesthesia complications

## 2015-08-23 MED FILL — Lactated Ringer's Solution: INTRAVENOUS | Qty: 1000 | Status: AC

## 2015-08-23 MED FILL — Ephedrine Sulfate Inj 50 MG/ML: INTRAMUSCULAR | Qty: 1 | Status: AC

## 2015-08-23 MED FILL — Succinylcholine Chloride Inj 20 MG/ML: INTRAMUSCULAR | Qty: 6.5 | Status: AC

## 2015-08-23 MED FILL — Ondansetron HCl Inj 4 MG/2ML (2 MG/ML): INTRAMUSCULAR | Qty: 2 | Status: AC

## 2015-08-23 MED FILL — Fentanyl Citrate Preservative Free (PF) Inj 100 MCG/2ML: INTRAMUSCULAR | Qty: 2 | Status: AC

## 2015-08-23 MED FILL — Lidocaine HCl IV Inj 20 MG/ML: INTRAVENOUS | Qty: 5 | Status: AC

## 2015-08-23 MED FILL — Propofol IV Emul 200 MG/20ML (10 MG/ML): INTRAVENOUS | Qty: 20 | Status: AC

## 2015-08-23 MED FILL — Phenylephrine HCl Inj 10 MG/ML: INTRAMUSCULAR | Qty: 1 | Status: AC

## 2015-08-23 NOTE — Telephone Encounter (Signed)
Order faxed as requested

## 2015-08-24 ENCOUNTER — Encounter (HOSPITAL_COMMUNITY): Payer: Self-pay | Admitting: Radiology

## 2015-08-24 ENCOUNTER — Inpatient Hospital Stay (HOSPITAL_COMMUNITY): Admission: RE | Admit: 2015-08-24 | Payer: Medicare Other | Source: Ambulatory Visit

## 2015-08-25 ENCOUNTER — Ambulatory Visit: Payer: Medicare Other | Admitting: Internal Medicine

## 2015-08-29 ENCOUNTER — Ambulatory Visit (HOSPITAL_COMMUNITY): Payer: Medicare Other

## 2015-09-04 ENCOUNTER — Inpatient Hospital Stay: Admit: 2015-09-04 | Payer: Medicare Other | Admitting: Orthopedic Surgery

## 2015-09-04 SURGERY — ARTHROPLASTY, KNEE, TOTAL
Anesthesia: General | Laterality: Right

## 2015-09-06 ENCOUNTER — Telehealth: Payer: Self-pay | Admitting: Internal Medicine

## 2015-09-06 MED ORDER — OXYCODONE-ACETAMINOPHEN 10-325 MG PO TABS: 1.0000 | ORAL_TABLET | ORAL | Status: DC | PRN

## 2015-09-06 NOTE — Telephone Encounter (Signed)
Rx ready for pick up and patient is aware 

## 2015-09-06 NOTE — Telephone Encounter (Signed)
Pt called about not getting operated on her right knee right now they want to work on her back first she is now doing PT. Pt needs a Rx for oxyCODONE-acetaminophen (PERCOCET) 10-325 MG tablet so she can get through the PT.   Please call pt when Rx is ready for pick up. Pt ask if she can have a Rx for two months.   Call pt @ [phone]. Thank you!

## 2015-09-06 NOTE — Telephone Encounter (Signed)
Refilled x 2 months printed rx

## 2015-09-06 NOTE — Telephone Encounter (Signed)
Last fill was 05/24/15, last OV 07/30/15

## 2015-10-04 ENCOUNTER — Telehealth: Payer: Self-pay | Admitting: Internal Medicine

## 2015-10-04 ENCOUNTER — Telehealth: Payer: Self-pay | Admitting: *Deleted

## 2015-10-04 NOTE — Telephone Encounter (Signed)
Crystal [phone] [facility] script for pt walker with wheels to be refax due not able to read. Fax number [phone]. Thank you!

## 2015-10-04 NOTE — Telephone Encounter (Signed)
Can this be a Monday evening as she doesn't have other spots currently? thanks

## 2015-10-04 NOTE — Telephone Encounter (Signed)
Left a detailed message for Daughter with instructions. thanks

## 2015-10-04 NOTE — Telephone Encounter (Signed)
Patients daughter requested patient be seen by Dr.Tullo, pt has issues with her legs and walking, daughter would like to have antiinflammatory medication adjusted. Daughter contact(Mary)  [phone]

## 2015-10-04 NOTE — Telephone Encounter (Signed)
Noted, thanks!

## 2015-10-04 NOTE — Telephone Encounter (Signed)
What kind of issues I know she has knee issues but if she has increased swelling sharpe pain or numbness we could be talking blood clot because this patient is not very mobile. If not these problem early appointment on Monday not at 6.30 .

## 2015-10-04 NOTE — Telephone Encounter (Signed)
Do you have this prescription, can you re-fax if so, thanks

## 2015-10-04 NOTE — Telephone Encounter (Signed)
I have no appointments today surely her orthopedist has a PA or colleague that can see her since he is planningn to have surgery.  She should ice the knee for 15 minutes every 3-4 hours ,  Continue celebrex and make appt woth ortho asap

## 2015-10-04 NOTE — Telephone Encounter (Signed)
Spoke with the daughter Bonnie Hinton, Patient fell Thursday, slid out of bed.  She had to call EMS to get her up, reinjured the arthritis in her knee.  Per patient daughter knee is swollen, but not red, but has caused flexion. Patient has been taking the celebrex with no inflammatory relief.  Daughter is a Physical therapist so she has been working with her mom to try exercises and movement.  They tried to get an appt with Dr. Arie SabinaWanger, ? But not able to.  Please advise ?

## 2015-10-04 NOTE — Telephone Encounter (Signed)
Printed order from chart had MD sign and re-faxed to [facility].

## 2015-10-30 ENCOUNTER — Encounter: Payer: Medicare Other | Attending: Physical Medicine and Rehabilitation | Admitting: Skilled Nursing Facility1

## 2015-10-30 ENCOUNTER — Encounter: Payer: Self-pay | Admitting: Skilled Nursing Facility1

## 2015-10-30 DIAGNOSIS — I10 Essential (primary) hypertension: Secondary | ICD-10-CM | POA: Diagnosis not present

## 2015-10-30 DIAGNOSIS — E119 Type 2 diabetes mellitus without complications: Secondary | ICD-10-CM | POA: Insufficient documentation

## 2015-10-30 DIAGNOSIS — Z713 Dietary counseling and surveillance: Secondary | ICD-10-CM | POA: Insufficient documentation

## 2015-10-30 DIAGNOSIS — Z6841 Body Mass Index (BMI) 40.0 and over, adult: Secondary | ICD-10-CM | POA: Diagnosis not present

## 2015-10-30 NOTE — Patient Instructions (Addendum)
-  Try checking your blood sugars at different times throughout the week: sometimes before you eat sometimes 2 hours after you eat -Make sure the alcohol is dry before you test -Never use the same needle more than once -Proper disposal of needles: throw in a thick plastic container with a lid, put the lid on tight, write do NOT recycle on it, throw in trash -Test the side of your finger not the tip -Check your feet every day with a hand mirror: you are looking for anything that was not there the day before, pay attention for good feeling -Be sure to take your lasix every day -Be your own advocate

## 2015-10-30 NOTE — Progress Notes (Signed)
Diabetes Self-Management Education  Visit Type: First/Initial  Appt. Start Time: 2:00 Appt. End Time: 3:30  10/30/2015  [name], identified by name and date of birth, is a 75 y.o. female with a diagnosis of Diabetes: Type 2.   ASSESSMENT  Height 5\' 7"  (1.702 m), weight 297 lb (134.7 kg). Body mass index is 46.52 kg/m.  Pts daughter whom is very involved in her care states the pt was never told she had diabetes-According to the labs available the pts A1C is highest at 6.3 and her glucose readings were not over 126. Pt states she Cant stand and has little mobility. Pt came to her appointment using a walker with visible wincing from pain in her knee.      Diabetes Self-Management Education - 10/30/15 1410      Visit Information   Visit Type First/Initial     Initial Visit   Diabetes Type Type 2   Are you currently following a meal plan? Yes   What type of meal plan do you follow? no added sugar   Are you taking your medications as prescribed? Yes   Date Diagnosed 6 years     Health Coping   How would you rate your overall health? Good     Psychosocial Assessment   Patient Belief/Attitude about Diabetes Motivated to manage diabetes   Self-care barriers None   Self-management support Family   Patient Concerns Nutrition/Meal planning;Glycemic Control;Weight Control     Pre-Education Assessment   Patient understands the diabetes disease and treatment process. Needs Instruction   Patient understands incorporating nutritional management into lifestyle. Needs Instruction   Patient undertands incorporating physical activity into lifestyle. Needs Instruction   Patient understands using medications safely. Needs Instruction   Patient understands monitoring blood glucose, interpreting and using results Needs Instruction   Patient understands prevention, detection, and treatment of acute complications. Needs Instruction   Patient understands prevention, detection, and  treatment of chronic complications. Needs Instruction   Patient understands how to develop strategies to address psychosocial issues. Needs Instruction   Patient understands how to develop strategies to promote health/change behavior. Needs Instruction     Complications   Last HgB A1C per patient/outside source 6.3 %   How often do you check your blood sugar? 1-2 times/day   Fasting Blood glucose range (mg/dL) [phone]   Number of hypoglycemic episodes per month 0   Number of hyperglycemic episodes per week 0   Have you had a dilated eye exam in the past 12 months? Yes   Have you had a dental exam in the past 12 months? Yes   Are you checking your feet? No     Dietary Intake   Lunch chicekn salad     Exercise   Exercise Type ADL's  PT   How many days per week to you exercise? 7   How many minutes per day do you exercise? 30   Total minutes per week of exercise 210     Patient Education   Previous Diabetes Education No   Disease state  Definition of diabetes, type 1 and 2, and the diagnosis of diabetes;Factors that contribute to the development of diabetes   Nutrition management  Role of diet in the treatment of diabetes and the relationship between the three main macronutrients and blood glucose level;Food label reading, portion sizes and measuring food.;Carbohydrate counting;Information on hints to eating out and maintain blood glucose control.;Meal options for control of blood glucose level and chronic complications.  Physical activity and exercise  Role of exercise on diabetes management, blood pressure control and cardiac health.   Monitoring Purpose and frequency of SMBG.;Yearly dilated eye exam;Daily foot exams;Taught/discussed recording of test results and interpretation of SMBG.   Chronic complications Relationship between chronic complications and blood glucose control;Dental care;Reviewed with patient heart disease, higher risk of, and prevention;Assessed and discussed foot  care and prevention of foot problems   Psychosocial adjustment Role of stress on diabetes;Identified and addressed patients feelings and concerns about diabetes   Personal strategies to promote health Lifestyle issues that need to be addressed for better diabetes care     Individualized Goals (developed by patient)   Nutrition Follow meal plan discussed;Adjust meds/carbs with exercise as discussed   Physical Activity Exercise 5-7 days per week;15 minutes per day   Monitoring  test blood glucose pre and post meals as discussed;test my blood glucose as discussed     Post-Education Assessment   Patient understands the diabetes disease and treatment process. Demonstrates understanding / competency   Patient understands incorporating nutritional management into lifestyle. Demonstrates understanding / competency   Patient undertands incorporating physical activity into lifestyle. Demonstrates understanding / competency   Patient understands using medications safely. Demonstrates understanding / competency   Patient understands monitoring blood glucose, interpreting and using results Demonstrates understanding / competency   Patient understands prevention, detection, and treatment of acute complications. Demonstrates understanding / competency   Patient understands prevention, detection, and treatment of chronic complications. Demonstrates understanding / competency   Patient understands how to develop strategies to address psychosocial issues. Demonstrates understanding / competency   Patient understands how to develop strategies to promote health/change behavior. Demonstrates understanding / competency     Outcomes   Expected Outcomes Demonstrated interest in learning. Expect positive outcomes   Future DMSE PRN   Program Status Completed      Individualized Plan for Diabetes Self-Management Training:   Learning Objective:  Patient will have a greater understanding of diabetes  self-management. Patient education plan is to attend individual and/or group sessions per assessed needs and concerns.   Plan:   Patient Instructions  -Try checking your blood sugars at different times throughout the week: sometimes before you eat sometimes 2 hours after you eat -Make sure the alcohol is dry before you test -Never use the same needle more than once -Proper disposal of needles: throw in a thick plastic container with a lid, put the lid on tight, write do NOT recycle on it, throw in trash -Test the side of your finger not the tip -Check your feet every day with a hand mirror: you are looking for anything that was not there the day before, pay attention for good feeling -Be sure to take your lasix every day -Be your own advocate    Expected Outcomes:  Demonstrated interest in learning. Expect positive outcomes  Education material provided: Living Well with Diabetes, Food label handouts, Meal plan card and Snack sheet  If problems or questions, patient to contact team via:  Phone  Future DSME appointment: PRN

## 2015-11-02 ENCOUNTER — Telehealth: Payer: Self-pay | Admitting: *Deleted

## 2015-11-02 DIAGNOSIS — G8929 Other chronic pain: Secondary | ICD-10-CM

## 2015-11-02 DIAGNOSIS — M5441 Lumbago with sciatica, right side: Principal | ICD-10-CM

## 2015-11-02 NOTE — Telephone Encounter (Signed)
She was given an rx in June that was  for refill on July 14th for #180 that is supposed to last for 2 months. Did she go through #180 in one month?

## 2015-11-02 NOTE — Telephone Encounter (Signed)
Patient was given a Rx on 09/06/2015 for #180 and no refills, please advise, thanks

## 2015-11-02 NOTE — Telephone Encounter (Signed)
Patient has requested a medication refill for percocet to help her get through physical therapy.  Pt contact [phone]

## 2015-11-03 ENCOUNTER — Telehealth: Payer: Self-pay | Admitting: Internal Medicine

## 2015-11-03 NOTE — Telephone Encounter (Signed)
Erie NoeVanessa can you please schedule this appointment for Dr. Darrick Huntsmanullo? We will also be doing a CSCduring appointment.

## 2015-11-03 NOTE — Telephone Encounter (Signed)
I do not see a narcotics contract in her chart.  She will need to schedule an appt  To discuss her increased use of narcotics before I will make any refills .

## 2015-11-03 NOTE — Telephone Encounter (Signed)
Patient picked up RX on [id number] per [facility].

## 2015-11-03 NOTE — Telephone Encounter (Signed)
Patient called stating she got a call yesterday. She is returning the message. Patient is requesting a call back. Her number is [phone]. Thanks

## 2015-11-03 NOTE — Telephone Encounter (Signed)
Please call her pharmacy and find out when her last refill for #180 was filled. Something still doesn't add up.   if she received 180 on July 14 , and used 3 daily (as previously agreed to  ) plus 6 additional per week for PT (not agreed to)   She would have used 114 (90 plus 24)  from July 14 to Aug 14th and should have at least 65 left

## 2015-11-03 NOTE — Telephone Encounter (Signed)
So she has used 150 percocet in  19 days.  She will have to have an appt as previously stated,

## 2015-11-03 NOTE — Telephone Encounter (Signed)
Patient has been taking two to three pills before PT twice a week.  Once at night before bed.  Patient stated she has 28 pills max left in bottle. Please advise

## 2015-11-03 NOTE — Telephone Encounter (Signed)
Left a VM to return my call, thanks 

## 2015-11-05 NOTE — Assessment & Plan Note (Signed)
Patient use an excessive amount of oxycodone for management of back pain without discussing with provider of medications.  150 in 19 days, based on refill done on July 22 (per pharmacy) and patient stating that she currently had no more than 28 left.  Early refills will not be provided and patient must be seen to discuss her increased use and risk of accidental overdose, as well as whether any additional refills will be given by this provider.

## 2015-11-06 NOTE — Telephone Encounter (Signed)
Noted thanks °

## 2015-11-17 ENCOUNTER — Encounter: Payer: Self-pay | Admitting: Skilled Nursing Facility1

## 2015-12-04 ENCOUNTER — Encounter: Payer: Self-pay | Admitting: Internal Medicine

## 2015-12-04 ENCOUNTER — Ambulatory Visit (INDEPENDENT_AMBULATORY_CARE_PROVIDER_SITE_OTHER): Payer: Medicare Other | Admitting: Internal Medicine

## 2015-12-04 ENCOUNTER — Encounter (INDEPENDENT_AMBULATORY_CARE_PROVIDER_SITE_OTHER): Payer: Self-pay

## 2015-12-04 VITALS — BP 146/86 | HR 88 | Temp 97.6°F | Resp 16 | Ht 68.0 in | Wt 297.8 lb

## 2015-12-04 DIAGNOSIS — E1122 Type 2 diabetes mellitus with diabetic chronic kidney disease: Secondary | ICD-10-CM

## 2015-12-04 DIAGNOSIS — E559 Vitamin D deficiency, unspecified: Secondary | ICD-10-CM | POA: Diagnosis not present

## 2015-12-04 DIAGNOSIS — Z23 Encounter for immunization: Secondary | ICD-10-CM | POA: Diagnosis not present

## 2015-12-04 DIAGNOSIS — E785 Hyperlipidemia, unspecified: Secondary | ICD-10-CM | POA: Diagnosis not present

## 2015-12-04 DIAGNOSIS — N189 Chronic kidney disease, unspecified: Secondary | ICD-10-CM

## 2015-12-04 DIAGNOSIS — IMO0001 Reserved for inherently not codable concepts without codable children: Secondary | ICD-10-CM

## 2015-12-04 DIAGNOSIS — E119 Type 2 diabetes mellitus without complications: Secondary | ICD-10-CM

## 2015-12-04 DIAGNOSIS — I129 Hypertensive chronic kidney disease with stage 1 through stage 4 chronic kidney disease, or unspecified chronic kidney disease: Secondary | ICD-10-CM | POA: Diagnosis not present

## 2015-12-04 DIAGNOSIS — M5441 Lumbago with sciatica, right side: Secondary | ICD-10-CM

## 2015-12-04 DIAGNOSIS — G8929 Other chronic pain: Secondary | ICD-10-CM

## 2015-12-04 MED ORDER — OXYCODONE-ACETAMINOPHEN 10-325 MG PO TABS: 1.0000 | ORAL_TABLET | ORAL | 0 refills | Status: DC | PRN

## 2015-12-04 MED ORDER — AMLODIPINE BESYLATE 5 MG PO TABS: 5.0000 mg | ORAL_TABLET | Freq: Every day | ORAL | 3 refills | Status: DC

## 2015-12-04 MED ORDER — ONETOUCH ULTRASOFT LANCETS MISC: 12 refills | Status: DC

## 2015-12-04 MED ORDER — CELECOXIB 200 MG PO CAPS: 200.0000 mg | ORAL_CAPSULE | Freq: Two times a day (BID) | ORAL | 3 refills | Status: DC

## 2015-12-04 MED ORDER — TOLTERODINE TARTRATE ER 4 MG PO CP24: 4.0000 mg | ORAL_CAPSULE | Freq: Two times a day (BID) | ORAL | 3 refills | Status: DC

## 2015-12-04 MED ORDER — PANTOPRAZOLE SODIUM 40 MG PO TBEC
40.0000 mg | DELAYED_RELEASE_TABLET | Freq: Every day | ORAL | 3 refills | Status: DC
Start: 2015-12-04 — End: 2017-01-31

## 2015-12-04 NOTE — Progress Notes (Signed)
Subjective:  Patient ID: Bonnie Hinton, female    DOB: 1941/02/11  Age: 75 y.o. 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 Diagnoses of Hyperlipidemia, Vitamin D deficiency, Encounter for immunization, Chronic right-sided low back pain with right-sided sciatica, and Diabetes mellitus without complication (HCC) were also pertinent to this visit.  HPI Bonnie Hinton presents for refil on narcotics and follow up on type 2 DM and hypertension .  Patient was last seen in May for follow up on Type 2 DM,  Hypertension  At which time her Percocet was refilled for maximal dose of 6 per day .  She had the last prescription for Percocet refilled on July 22 for #180 tablets .  She called on August 13th to request the next monthly refill, stating that she had #28 pills left  She was told  that the prescription could not be refilled until September.  So she has been without Percocet since the end of August.  She has been in significant pain without the Percocet despite using celebrex and tylenol.   She has been taking up to 6 oxycodone per day for several months.  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 Maurice Small [facility] who has also been prescribing her tramadol for use  Three times daily.  She has had 2 falls at home  Since I last saw her.  She is getting PT for her knee to regain strength in her upper legs prior to knee replacement.  She is anxious to have the surgery so she can "get off the pain medications."   DM:  She had an ESI in her lumbar spine which caused her BS to transiently elevate.  She is checking sugars once daily  High is 130 has been using the same lancet for 3 months in her Verio glucometer.   Lab Results  Component Value Date   HGBA1C 6.4 12/04/2015     Outpatient Medications Prior to Visit  Medication Sig Dispense  Refill  . aspirin EC 81 MG tablet Take 81 mg by mouth daily.     Marland Kitchen b complex vitamins capsule Take 1 capsule by mouth daily.     . cycloSPORINE (RESTASIS) 0.05 % ophthalmic emulsion 1 drop Two (2) times a day. (Patient taking differently: Place 1 drop into both eyes 2 (two) times daily. ) 90 each 1  . diclofenac sodium (VOLTAREN) 1 % GEL Apply 2 g topically 4 (four) times daily.     Marland Kitchen estradiol (ESTRACE) 1 MG tablet Take 1 tablet (1 mg total) by mouth daily. 90 tablet 3  . ezetimibe (ZETIA) 10 MG tablet Take 1 tablet (10 mg total) by mouth daily. 90 tablet 3  . fluticasone (FLONASE) 50 MCG/ACT nasal spray 1 spray by Each Nare route every evening as needed for congestion    . furosemide (LASIX) 20 MG tablet Take 1 tablet (20 mg total) by mouth daily as needed for fluid or edema. 90 tablet 1  . glucose blood test strip Use to test blood sugar once daily .  E11.22 100 each 0  . levothyroxine (SYNTHROID, LEVOTHROID) 200 MCG tablet Take 1 tablet (200 mcg total) by mouth daily before breakfast. 90 tablet 1  . Liraglutide 18 MG/3ML SOPN Inject 0.6 mg into the skin daily.     . Omega-3 1000 MG CAPS  Take 1,000 mg by mouth daily.     Marland Kitchen OVER THE COUNTER MEDICATION     . PredniSONE (STERAPRED 12 DAY PO) Take 5 mg by mouth daily.    . rosuvastatin (CRESTOR) 20 MG tablet Take 1 tablet (20 mg total) by mouth daily. 90 tablet 1  . Vitamin D, Ergocalciferol, (DRISDOL) 50000 units CAPS capsule TAKE 1 CAPSULE (50,000 UNITS TOTAL) BY MOUTH ONCE A WEEK. (Patient taking differently: Take 50,000 Units by mouth every Sunday. ) 12 capsule 0  . amLODipine (NORVASC) 5 MG tablet Take 1 tablet (5 mg total) by mouth daily. 90 tablet 3  . celecoxib (CELEBREX) 200 MG capsule Take 1 capsule (200 mg total) by mouth 2 (two) times daily. 180 capsule 1  . oxyCODONE-acetaminophen (PERCOCET) 10-325 MG tablet Take 1 tablet by mouth every 4 (four) hours as needed. May refill on or after October 06 2015 180 tablet 0  . pantoprazole  (PROTONIX) 40 MG tablet Take 1 tablet (40 mg total) by mouth daily. 90 tablet 3  . tolterodine (DETROL LA) 4 MG 24 hr capsule Take 1 capsule (4 mg total) by mouth 2 (two) times daily. 180 capsule 1   No facility-administered medications prior to visit.     Review of Systems;  Patient denies headache, fevers, malaise, unintentional weight loss, skin rash, eye pain, sinus congestion and sinus pain, sore throat, dysphagia,  hemoptysis , cough, dyspnea, wheezing, chest pain, palpitations, orthopnea, edema, abdominal pain, nausea, melena, diarrhea, constipation, flank pain, dysuria, hematuria, urinary  Frequency, nocturia, numbness, tingling, seizures,  Focal weakness, Loss of consciousness,  Tremor, insomnia, depression, anxiety, and suicidal ideation.      Objective:  BP (!) 146/86 (BP Location: Left Arm, Patient Position: Sitting, Cuff Size: Large)   Pulse 88   Temp 97.6 F (36.4 C) (Oral)   Resp 16   Ht 5\' 8"  (1.727 m)   Wt 297 lb 12.8 oz (135.1 kg)   SpO2 95%   BMI 45.28 kg/m   BP Readings from Last 3 Encounters:  12/04/15 (!) 146/86  08/22/15 (!) 141/70  08/15/15 (!) 148/80    Wt Readings from Last 3 Encounters:  12/04/15 297 lb 12.8 oz (135.1 kg)  10/30/15 297 lb (134.7 kg)  08/22/15 294 lb (133.4 kg)    General appearance: alert, cooperative and appears stated age Ears: normal TM's and external ear canals both ears Throat: lips, mucosa, and tongue normal; teeth and gums normal Neck: no adenopathy, no carotid bruit, supple, symmetrical, trachea midline and thyroid not enlarged, symmetric, no tenderness/mass/nodules Back: symmetric, no curvature. ROM normal. No CVA tenderness. Lungs: clear to auscultation bilaterally Heart: regular rate and rhythm, S1, S2 normal, no murmur, click, rub or gallop Abdomen: soft, non-tender; bowel sounds normal; no masses,  no organomegaly Pulses: 2+ and symmetric Skin: Skin color, texture, turgor normal. No rashes or lesions Lymph nodes:  Cervical, supraclavicular, and axillary nodes normal.  Lab Results  Component Value Date   HGBA1C 6.4 12/04/2015   HGBA1C 6.3 08/01/2015   HGBA1C 6.0 04/11/2015    Lab Results  Component Value Date   CREATININE 0.74 12/04/2015   CREATININE 0.89 08/17/2015   CREATININE 0.82 08/01/2015    Lab Results  Component Value Date   WBC 11.5 (H) 08/17/2015   HGB 13.8 08/17/2015   HCT 42.3 08/17/2015   PLT 326 08/17/2015   GLUCOSE 100 (H) 12/04/2015   CHOL 153 12/04/2015   TRIG 141.0 12/04/2015   HDL 57.80 12/04/2015   LDLDIRECT 62.0  12/04/2015   LDLCALC 67 12/04/2015   ALT 37 (H) 12/04/2015   AST 42 (H) 12/04/2015   NA 135 12/04/2015   K 4.1 12/04/2015   CL 100 12/04/2015   CREATININE 0.74 12/04/2015   BUN 23 12/04/2015   CO2 28 12/04/2015   TSH 1.53 04/11/2015   HGBA1C 6.4 12/04/2015   MICROALBUR 1.4 08/01/2015    Mr Lumbar Spine Wo Contrast  Result Date: 08/22/2015 CLINICAL DATA:  Low back pain, bilateral leg pain EXAM: MRI LUMBAR SPINE WITHOUT CONTRAST TECHNIQUE: Multiplanar, multisequence MR imaging of the lumbar spine was performed. No intravenous contrast was administered. COMPARISON:  None. FINDINGS: Segmentation:  Standard. Alignment: 1-2 mm of anterolisthesis of L4 on L5 secondary to facet arthropathy. Vertebrae:  No fracture, evidence of discitis, or bone lesion. Conus medullaris: Extends to the L1 level and appears normal. Paraspinal and other soft tissues: Negative. 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 T12-L1: No significant disc bulge. No evidence of neural foraminal stenosis. No central canal stenosis. L1-L2: No significant disc bulge. No evidence of neural foraminal stenosis. No central canal stenosis. L2-L3: No significant disc bulge. No evidence of neural foraminal stenosis. No central canal stenosis. Mild bilateral facet arthropathy. L3-L4: Mild broad-based disc bulge. Mild bilateral facet arthropathy. No  evidence of neural foraminal stenosis. No central canal stenosis. L4-L5: Mild broad-based disc bulge. Severe bilateral facet arthropathy. Severe spinal stenosis. Mild right foraminal narrowing. Severe left foraminal narrowing. L5-S1: Small right lateral disc protrusion abutting the right extra foraminal L5 nerve root. No evidence of neural foraminal stenosis. No central canal stenosis. IMPRESSION: 1. At L4-5 there is a mild broad-based disc bulge. Severe bilateral facet arthropathy. Severe spinal stenosis. Mild right foraminal narrowing. Severe left foraminal narrowing. 2. At L5-S1 there is a small right lateral disc protrusion abutting the right extra foraminal L5 nerve root. Electronically Signed   By: Elige KoHetal  Patel   On: 08/22/2015 11:26    Assessment & Plan:   Problem List Items Addressed This Visit    Diabetes mellitus without complication (HCC)    Currently well-controlled on current medications .  hemoglobin A1c is at goal of less than 7.0 . Patient is reminded to schedule an annual eye exam and foot exam is normal today. Patient has no microalbuminuria. Patient is tolerating Crestor for CAD risk reduction but had hyperkalemia on ACE/ARB for renal protection and hypertension   Lab Results  Component Value Date   HGBA1C 6.4 12/04/2015   Lab Results  Component Value Date   MICROALBUR 1.4 08/01/2015           Chronic low back pain with right-sided sciatica    Patient has spinal stenosis which has failed to resolved with epidural steroid injections.  She is deferring surgery until her knee surgery has been successfully completed.  I have reviewed the [place] Controlled substance database and her refill history includes monthly refills of Percocet from me and monthly refills of tramadol from Dr Maurice SmallIbazebo.       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 Newkirk's oxyCODONE-acetaminophen and oxyCODONE-acetaminophen. I have also changed her oxyCODONE-acetaminophen. Additionally, I am having her start on onetouch ultrasoft. Lastly, I am having her maintain her aspirin EC, b complex vitamins, diclofenac sodium, fluticasone, Liraglutide, Omega-3, estradiol, ezetimibe, rosuvastatin, glucose blood, cycloSPORINE, furosemide, levothyroxine, Vitamin D (Ergocalciferol), OVER THE COUNTER MEDICATION, PredniSONE (STERAPRED 12 DAY PO), celecoxib, pantoprazole, tolterodine, and amLODipine.  Meds ordered this encounter  Medications  . DISCONTD: oxyCODONE-acetaminophen (PERCOCET) 10-325 MG tablet    Sig: Take 1 tablet by mouth every 4 (four) hours as needed.    Dispense:  180 tablet    Refill:  0  . DISCONTD: oxyCODONE-acetaminophen (PERCOCET) 10-325 MG tablet    Sig: Take 1 tablet by mouth every 4 (four) hours as needed. May refill on or after January 03 2016    Dispense:  180 tablet    Refill:  0  . oxyCODONE-acetaminophen (PERCOCET) 10-325 MG tablet    Sig: Take 1 tablet by mouth every 4 (four) hours as needed. May refill on or after February 03 2016    Dispense:  180 tablet    Refill:  0  . Lancets (ONETOUCH ULTRASOFT) lancets    Sig: Use as instructed    Dispense:  100 each    Refill:  12    FOR ONE TOUCH VERIO GLUCOMETER  USE ONCE DAILY TO CHECK SUGARS  . celecoxib (CELEBREX) 200 MG capsule    Sig: Take 1 capsule (200 mg total) by mouth 2 (two) times daily.    Dispense:  180 capsule    Refill:  3  .  pantoprazole (PROTONIX) 40 MG tablet    Sig: Take 1 tablet (40 mg total) by mouth daily.    Dispense:  90 tablet    Refill:  3  . tolterodine (DETROL LA) 4 MG 24 hr capsule    Sig: Take 1 capsule (4 mg total) by mouth 2 (two) times daily.    Dispense:  180 capsule    Refill:  3    PHARMACY NOTE DOSE CHANGE to twice daily  . amLODipine (NORVASC) 5 MG tablet    Sig: Take 1 tablet (5 mg total) by mouth daily.    Dispense:  90 tablet    Refill:  3    Medications Discontinued During This Encounter  Medication Reason  . oxyCODONE-acetaminophen (PERCOCET) 10-325 MG tablet Reorder  . oxyCODONE-acetaminophen (PERCOCET) 10-325 MG tablet Reorder  . oxyCODONE-acetaminophen (PERCOCET) 10-325 MG tablet Reorder  . celecoxib (CELEBREX) 200 MG capsule Reorder  . pantoprazole (PROTONIX) 40 MG tablet Reorder  . tolterodine (DETROL LA) 4 MG 24 hr capsule Reorder  . amLODipine (NORVASC) 5 MG tablet Reorder    Follow-up: No Follow-up on file.   Sherlene Shams, MD

## 2015-12-04 NOTE — Patient Instructions (Addendum)
I AM SORRY about the refills misunderstanding.  I have refilled your Percocet for 3 months   I will see you in 3 months  I have sent your other medications to [facility]

## 2015-12-05 LAB — LDL CHOLESTEROL, DIRECT: Direct LDL: 62 mg/dL

## 2015-12-05 LAB — COMPREHENSIVE METABOLIC PANEL
ALT: 37 U/L — ABNORMAL HIGH (ref 0–35)
AST: 42 U/L — ABNORMAL HIGH (ref 0–37)
Albumin: 3.9 g/dL (ref 3.5–5.2)
Alkaline Phosphatase: 60 U/L (ref 39–117)
BUN: 23 mg/dL (ref 6–23)
CO2: 28 mEq/L (ref 19–32)
Calcium: 9 mg/dL (ref 8.4–10.5)
Chloride: 100 mEq/L (ref 96–112)
Creatinine, Ser: 0.74 mg/dL (ref 0.40–1.20)
GFR: 81.34 mL/min (ref >60.00)
Glucose, Bld: 100 mg/dL — ABNORMAL HIGH (ref 70–99)
Potassium: 4.1 mEq/L (ref 3.5–5.1)
Sodium: 135 mEq/L (ref 135–145)
Total Bilirubin: 0.4 mg/dL (ref 0.2–1.2)
Total Protein: 7.4 g/dL (ref 6.0–8.3)

## 2015-12-05 LAB — LIPID PANEL
Cholesterol: 153 mg/dL (ref 0–200)
HDL: 57.8 mg/dL (ref >39.00)
LDL Cholesterol: 67 mg/dL (ref 0–99)
NonHDL: 95.24
Total CHOL/HDL Ratio: 3
Triglycerides: 141 mg/dL (ref 0.0–149.0)
VLDL: 28.2 mg/dL (ref 0.0–40.0)

## 2015-12-05 LAB — VITAMIN D 25 HYDROXY (VIT D DEFICIENCY, FRACTURES): VITD: 20.2 ng/mL — ABNORMAL LOW (ref 30.00–100.00)

## 2015-12-05 LAB — HEMOGLOBIN A1C: Hgb A1c MFr Bld: 6.4 % (ref 4.6–6.5)

## 2015-12-05 NOTE — Assessment & Plan Note (Signed)
Currently well-controlled on current medications .  hemoglobin A1c is at goal of less than 7.0 . Patient is reminded to schedule an annual eye exam and foot exam is normal today. Patient has no microalbuminuria. Patient is tolerating Crestor for CAD risk reduction but had hyperkalemia on ACE/ARB for renal protection and hypertension   Lab Results  Component Value Date   HGBA1C 6.4 12/04/2015   Lab Results  Component Value Date   MICROALBUR 1.4 08/01/2015

## 2015-12-05 NOTE — Progress Notes (Signed)
Pre visit review using our clinic review tool, if applicable. No additional management support is needed unless otherwise documented below in the visit note. 

## 2015-12-05 NOTE — Assessment & Plan Note (Addendum)
Patient has spinal stenosis which has failed to resolved with epidural steroid injections.  She is deferring surgery until her knee surgery has been successfully completed.  I have reviewed the [place] Controlled substance database and her refill history includes monthly refills of Percocet from me and monthly refills of tramadol from Dr Maurice SmallIbazebo.

## 2015-12-07 ENCOUNTER — Telehealth: Payer: Self-pay

## 2015-12-07 MED ORDER — ERGOCALCIFEROL 1.25 MG (50000 UT) PO CAPS
50000.0000 [IU] | ORAL_CAPSULE | ORAL | 0 refills | Status: DC
Start: 2015-12-07 — End: 2016-03-29

## 2015-12-07 NOTE — Telephone Encounter (Signed)
Spoke with patient and she said she is in need of more Vitamin D medication sent [facility]. Also she stated she wanted to let you know she is scheduled to have her knee surgery on October 9th with Dr. Thurston HoleWainer.

## 2015-12-07 NOTE — Telephone Encounter (Signed)
That's good news.  Vit d med sent

## 2015-12-08 ENCOUNTER — Telehealth: Payer: Self-pay | Admitting: *Deleted

## 2015-12-08 NOTE — Telephone Encounter (Signed)
Lmom Rx was sent into the pharmacy, if any questions they could cb

## 2015-12-08 NOTE — Telephone Encounter (Signed)
Is there an alternate? Or just OTC?Please advise.

## 2015-12-08 NOTE — Telephone Encounter (Signed)
[facility] stated [facility] will not cover her Vitamin D. They requested a alternate. [facility] Contact [phone] Pt contact [phone]

## 2015-12-08 NOTE — Telephone Encounter (Signed)
Pt aware of message

## 2015-12-08 NOTE — Telephone Encounter (Signed)
Left message for patient to return call back. Patient needs to be told to get medication OTC since insurance will not pay for it.

## 2015-12-08 NOTE — Telephone Encounter (Signed)
There is no prescribed alternative .  She can start taking 2,000 Ius of D3 daily,  Available otc  She doesn't need a higher dose,  Based on he rlast level.

## 2015-12-20 ENCOUNTER — Encounter: Payer: Self-pay | Admitting: Physician Assistant

## 2015-12-20 DIAGNOSIS — M1711 Unilateral primary osteoarthritis, right knee: Secondary | ICD-10-CM | POA: Diagnosis present

## 2015-12-20 NOTE — H&P (Signed)
TOTAL KNEE ADMISSION H&P  Patient is being admitted for right total knee arthroplasty.  Subjective:  Chief Complaint:right knee pain.  HPI: Bonnie Hinton, 75 y.o. female, has a history of pain and functional disability in the right knee due to arthritis and has failed non-surgical conservative treatments for greater than 12 weeks to includeNSAID's and/or analgesics, corticosteriod injections, viscosupplementation injections, flexibility and strengthening excercises, supervised PT with diminished ADL's post treatment, use of assistive devices, weight reduction as appropriate and activity modification.  Onset of symptoms was gradual, starting 10 years ago with gradually worsening course since that time. The patient noted no past surgery on the right knee(s).  Patient currently rates pain in the right knee(s) at 10 out of 10 with activity. Patient has night pain, worsening of pain with activity and weight bearing, pain that interferes with activities of daily living, crepitus and joint swelling.  Patient has evidence of subchondral sclerosis, periarticular osteophytes and joint space narrowing by imaging studies.  There is no active infection.  Patient Active Problem List   Diagnosis Date Noted  . Primary localized osteoarthritis of right knee   . Arthritis   . Diabetes mellitus without complication (HCC)   . Hypertension   . Thyroid disease   . Chronic kidney disease   . Subdural hematoma (HCC)   . Status post bilateral hip replacements   . Status post left knee replacement   . Chronic low back pain with right-sided sciatica   . Essential hypertension, benign 07/25/2015  . Preoperative clearance 07/25/2015  . Hyperlipidemia 07/25/2015  . Urinary urgency 04/26/2015  . Xerosis of skin 04/26/2015  . Hyperkalemia 04/26/2015  . 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 Morbid obesity (HCC) 01/24/2015  . Vitamin D deficiency 01/24/2015  . Arthritis of knee, degenerative  09/23/2014   Past Medical History:  Diagnosis Date  . Anxiety   . Arthritis   . Chronic kidney disease   . Chronic low back pain with right-sided sciatica   . Chronically dry eyes   . Complication of anesthesia    anxious when she wakes up  . Constipation due to pain medication   . Diabetes mellitus without complication (HCC)    type 2  . Fatty liver   . GERD (gastroesophageal reflux disease)   . Hypertension   . Neuropathy (HCC)   . Pneumonia   . Primary localized osteoarthritis of right knee   . Status post bilateral hip replacements   . Status post left knee replacement   . 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 Thyroid disease     Past Surgical History:  Procedure Laterality Date  . ABDOMINAL HYSTERECTOMY    . COLONOSCOPY    . RADIOLOGY WITH ANESTHESIA N/A 08/22/2015   Procedure: MRI OF LUMBAR SPINE   (RADIOLOGY WITH ANESTHESIA);  Surgeon: Medication Radiologist, MD;  Location: [facility] OR;  Service: Radiology;  Laterality: N/A;  . TOTAL HIP ARTHROPLASTY Right   . TOTAL HIP ARTHROPLASTY Left   . TOTAL KNEE ARTHROPLASTY Left     No current facility-administered medications for this encounter.   Current Outpatient Prescriptions:  .  amLODipine (NORVASC) 5 MG tablet, Take 1 tablet (5 mg total) by mouth daily., Disp: 90 tablet, Rfl: 3 .  aspirin EC 81 MG tablet, Take 81 mg by mouth daily. , Disp: , Rfl:  .  b complex vitamins capsule, Take 1 capsule by mouth daily. , Disp: , Rfl:  .  celecoxib (CELEBREX) 200 MG capsule, Take 1 capsule (200 mg total) by mouth 2 (two) times daily., Disp: 180 capsule, Rfl: 3 .  cycloSPORINE (RESTASIS) 0.05 % ophthalmic emulsion, 1 drop Two (2) times a day. (Patient taking differently: Place 1 drop into both eyes 2 (two) times daily. ), Disp: 90 each, Rfl: 1 .  diclofenac sodium (VOLTAREN) 1 % GEL, Apply 2 g topically 4 (four) times daily. , Disp: , Rfl:  .  ergocalciferol (DRISDOL) 50000 units capsule, Take 1 capsule (50,000  Units total) by mouth once a week., Disp: 12 capsule, Rfl: 0 .  estradiol (ESTRACE) 1 MG tablet, Take 1 tablet (1 mg total) by mouth daily., Disp: 90 tablet, Rfl: 3 .  ezetimibe (ZETIA) 10 MG tablet, Take 1 tablet (10 mg total) by mouth daily., Disp: 90 tablet, Rfl: 3 .  fluticasone (FLONASE) 50 MCG/ACT nasal spray, 1 spray by Each Nare route every evening as needed for congestion, Disp: , Rfl:  .  furosemide (LASIX) 20 MG tablet, Take 1 tablet (20 mg total) by mouth daily as needed for fluid or edema., Disp: 90 tablet, Rfl: 1 .  levothyroxine (SYNTHROID, LEVOTHROID) 200 MCG tablet, Take 1 tablet (200 mcg total) by mouth daily before breakfast., Disp: 90 tablet, Rfl: 1 .  Liraglutide 18 MG/3ML SOPN, Inject 0.6 mg into the skin daily. , Disp: , Rfl:  .  Omega-3 1000 MG CAPS, Take 1,000 mg by mouth daily. , Disp: , Rfl:  .  oxyCODONE-acetaminophen (PERCOCET) 10-325 MG tablet, Take 1 tablet by mouth every 4 (four) hours as needed. May refill on or after February 03 2016, Disp: 180 tablet, Rfl: 0 .  pantoprazole (PROTONIX) 40 MG tablet, Take 1 tablet (40 mg total) by mouth daily., Disp: 90 tablet, Rfl: 3 .  rosuvastatin (CRESTOR) 20 MG tablet, Take 1 tablet (20 mg total) by mouth daily., Disp: 90 tablet, Rfl: 1 .  tolterodine (DETROL LA) 4 MG 24 hr capsule, Take 1 capsule (4 mg total) by mouth 2 (two) times daily., Disp: 180 capsule, Rfl: 3 .  glucose blood test strip, Use to test blood sugar once daily .  E11.22, Disp: 100 each, Rfl: 0 .  Lancets (ONETOUCH ULTRASOFT) lancets, Use as instructed, Disp: 100 each, Rfl: 12 .  OVER THE COUNTER MEDICATION, , Disp: , Rfl:  Allergies  Allergen Reactions  . Erythromycin Rash  . Iodinated Diagnostic Agents Rash and Swelling  . Fentanyl     Duragesic-25  . Lisinopril Other (See Comments)    Hyperkalemia   . Latex Itching and Rash    Social History  Substance Use Topics  . Smoking status: Never Smoker  . Smokeless tobacco: Never Used  . Alcohol use No     Family History  Problem Relation Age of Onset  . Cancer Father   . Cancer Brother   . Diabetes Maternal Aunt      Review of Systems  Constitutional: Negative.   HENT: Negative.   Eyes: Negative.   Respiratory: Negative.   Cardiovascular: Negative.   Gastrointestinal: Negative.   Genitourinary: Negative.   Musculoskeletal: Positive for back pain and joint pain.  Skin: Negative.   Neurological: Negative.   Endo/Heme/Allergies: Negative.   Psychiatric/Behavioral: Negative.     Objective:  Physical Exam  Constitutional: She is oriented to person, place, and time. She appears well-developed and well-nourished.  HENT:  Head: Normocephalic and atraumatic.  Mouth/Throat: Oropharynx is clear and moist.  Eyes: Conjunctivae are normal. Pupils are equal, round,  and reactive to light.  Neck: Neck supple.  Cardiovascular: Normal rate and regular rhythm.   Respiratory: Effort normal and breath sounds normal.  GI: Soft. Bowel sounds are normal.  Genitourinary:  Genitourinary Comments: Not pertinent to current symptomatology therefore not examined.  Musculoskeletal:  She is independently ambulatory with a significantly antalgic gait and the assistance of a rolling walker.  Evaluation of her right knee shows active range of motion -5 to 90 degrees.  2+ crepitus.  2+ synovitis.  Medial and lateral joint line tenderness.  Normal patella tracking.  Distal neurovascular exam is intact.  Left knee has active range of motion 0-110 degrees.  Well healed total knee incision.  Distal neurovascular exam is intact.  Both hips have well healed total hip incisions without pain, swelling or deformity.  Acceptable range of motion.  She has 2+ dorsalis pedis pulses.    Neurological: She is alert and oriented to person, place, and time.  Skin: Skin is warm and dry.  Psychiatric: She has a normal mood and affect. Her behavior is normal.    Vital signs in last 24 hours: Temp:  [98.3 F (36.8 C)] 98.3 F  (36.8 C) (09/27 1500) Pulse Rate:  [101] 101 (09/27 1500) BP: (147)/(84) 147/84 (09/27 1500) SpO2:  [94 %] 94 % (09/27 1500) Weight:  [136.5 kg (301 lb)] 136.5 kg (301 lb) (09/27 1500)  Labs:   Estimated body mass index is 47.14 kg/m as calculated from the following:   Height as of this encounter: 5\' 7"  (1.702 m).   Weight as of this encounter: 136.5 kg (301 lb).   Imaging Review Plain radiographs demonstrate severe degenerative joint disease of the right knee(s). The overall alignment issignificant valgus. The bone quality appears to be good for age and reported activity level.  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 The treatment options including medical management, injection therapy arthroscopy and arthroplasty were discussed at length. The risks and benefits of total knee arthroplasty were presented and reviewed. The risks due to aseptic loosening, infection, stiffness, patella tracking problems, thromboembolic complications and other imponderables were discussed. The patient acknowledged the explanation, agreed to proceed with the plan and consent was signed. Patient is being admitted for inpatient treatment for surgery, pain control, PT, OT, prophylactic antibiotics, VTE prophylaxis, progressive ambulation and  ADL's and discharge planning. The patient is planning to be discharged home with home health services

## 2015-12-21 ENCOUNTER — Encounter (HOSPITAL_COMMUNITY): Payer: Self-pay

## 2015-12-21 ENCOUNTER — Encounter (HOSPITAL_COMMUNITY)
Admission: RE | Admit: 2015-12-21 | Discharge: 2015-12-21 | Disposition: A | Discharge status: Home / Self Care | Payer: Medicare Other | Source: Ambulatory Visit | Attending: Orthopedic Surgery | Admitting: Orthopedic Surgery

## 2015-12-21 DIAGNOSIS — Z01812 Encounter for preprocedural laboratory examination: Secondary | ICD-10-CM | POA: Insufficient documentation

## 2015-12-21 HISTORY — DX: Hyperlipidemia, unspecified: E78.5

## 2015-12-21 HISTORY — DX: Pain in unspecified joint: M25.50

## 2015-12-21 HISTORY — DX: Dorsalgia, unspecified: M54.9

## 2015-12-21 HISTORY — DX: Prediabetes: R73.03

## 2015-12-21 HISTORY — DX: Frequency of micturition: R35.0

## 2015-12-21 HISTORY — DX: Edema, unspecified: R60.9

## 2015-12-21 HISTORY — DX: Personal history of other diseases of the respiratory system: Z87.09

## 2015-12-21 HISTORY — DX: Hypothyroidism, unspecified: E03.9

## 2015-12-21 HISTORY — DX: Unspecified glaucoma: H40.9

## 2015-12-21 HISTORY — DX: Localized edema: R60.0

## 2015-12-21 HISTORY — DX: Other chronic pain: G89.29

## 2015-12-21 HISTORY — DX: Urgency of urination: R39.15

## 2015-12-21 HISTORY — DX: Effusion, unspecified joint: M25.40

## 2015-12-21 HISTORY — DX: Personal history of Methicillin resistant Staphylococcus aureus infection: Z86.14

## 2015-12-21 LAB — SURGICAL PCR SCREEN
MRSA, PCR: POSITIVE — AB
Staphylococcus aureus: POSITIVE — AB

## 2015-12-21 LAB — TYPE AND SCREEN
ABO/RH(D): A POS
Antibody Screen: NEGATIVE

## 2015-12-21 LAB — PROTIME-INR
INR: 1.05
Prothrombin Time: 13.8 seconds (ref 11.4–15.2)

## 2015-12-21 LAB — COMPREHENSIVE METABOLIC PANEL
ALT: 17 U/L (ref 14–54)
AST: 27 U/L (ref 15–41)
Albumin: 3.6 g/dL (ref 3.5–5.0)
Alkaline Phosphatase: 57 U/L (ref 38–126)
Anion gap: 10 (ref 5–15)
BUN: 19 mg/dL (ref 6–20)
CO2: 24 mmol/L (ref 22–32)
Calcium: 9.1 mg/dL (ref 8.9–10.3)
Chloride: 106 mmol/L (ref 101–111)
Creatinine, Ser: 0.96 mg/dL (ref 0.44–1.00)
GFR calc Af Amer: >60 mL/min (ref >60)
GFR calc non Af Amer: 57 mL/min — ABNORMAL LOW (ref >60)
Glucose, Bld: 124 mg/dL — ABNORMAL HIGH (ref 65–99)
Potassium: 4 mmol/L (ref 3.5–5.1)
Sodium: 140 mmol/L (ref 135–145)
Total Bilirubin: 0.6 mg/dL (ref 0.3–1.2)
Total Protein: 7.1 g/dL (ref 6.5–8.1)

## 2015-12-21 LAB — CBC WITH DIFFERENTIAL/PLATELET
Basophils Absolute: 0 10*3/uL (ref 0.0–0.1)
Basophils Relative: 0 %
Eosinophils Absolute: 0.4 10*3/uL (ref 0.0–0.7)
Eosinophils Relative: 5 %
HCT: 46.5 % — ABNORMAL HIGH (ref 36.0–46.0)
Hemoglobin: 14.8 g/dL (ref 12.0–15.0)
Lymphocytes Relative: 29 %
Lymphs Abs: 2 10*3/uL (ref 0.7–4.0)
MCH: 29.7 pg (ref 26.0–34.0)
MCHC: 31.8 g/dL (ref 30.0–36.0)
MCV: 93.2 fL (ref 78.0–100.0)
Monocytes Absolute: 0.4 10*3/uL (ref 0.1–1.0)
Monocytes Relative: 6 %
Neutro Abs: 4 10*3/uL (ref 1.7–7.7)
Neutrophils Relative %: 60 %
Platelets: 265 10*3/uL (ref 150–400)
RBC: 4.99 MIL/uL (ref 3.87–5.11)
RDW: 13.4 % (ref 11.5–15.5)
WBC: 6.8 10*3/uL (ref 4.0–10.5)

## 2015-12-21 LAB — GLUCOSE, CAPILLARY: Glucose-Capillary: 145 mg/dL — ABNORMAL HIGH (ref 65–99)

## 2015-12-21 LAB — APTT: aPTT: 36 seconds (ref 24–36)

## 2015-12-21 LAB — ABO/RH: ABO/RH(D): A POS

## 2015-12-21 MED ORDER — POVIDONE-IODINE 7.5 % EX SOLN: Freq: Once | CUTANEOUS | Status: DC

## 2015-12-21 MED ORDER — CHLORHEXIDINE GLUCONATE 4 % EX LIQD: 60.0000 mL | Freq: Once | CUTANEOUS | Status: DC

## 2015-12-21 NOTE — Pre-Procedure Instructions (Signed)
Bonnie Hinton  12/21/2015      [id number] [place] [address] [place] [address] Phone: [phone] Fax: [phone]  [facility] [place] - [address] [place] [id number] Phone: [phone] Fax: [phone]    Your procedure is scheduled on Mon, Oct 9 @ 9:30 AM  [facility] at 7:30 AM  Call this number if you have problems the morning of surgery:  [phone]   Remember:  Do not eat food or drink liquids after midnight.  Take these medicines the morning of surgery with A SIP OF WATER Amlodipine(Norvasc),Eye Drops,Flonase(Fluticasone),Synthroid (Levothyroxine),Pain Pill(if needed),Pantoprazole(Protonix),and Detrol(Tolterodine)               Stop taking your Aspirin and Fish Oil along with any Vitamins or Herbal Medications.  No Goody's,BC's,Aleve,Advil,Motrin,or Ibuprofen.     How to Manage Your Diabetes Before and After Surgery  Why is it important to control my blood sugar before and after surgery? . Improving blood sugar levels before and after surgery helps healing and can limit problems. . A way of improving blood sugar control is eating a healthy diet by: o  Eating less sugar and carbohydrates o  Increasing activity/exercise o  Talking with your doctor about reaching your blood sugar goals . High blood sugars (greater than 180 mg/dL) can raise your risk of infections and slow your recovery, so you will need to focus on controlling your diabetes during the weeks before surgery. . Make sure that the doctor who takes care of your diabetes knows about your planned surgery including the date and location.  How do I manage my blood sugar before surgery? . Check your blood sugar at least 4 times a day, starting 2 days before surgery, to make sure that the level is not too high or low. o Check your blood sugar the morning of your  surgery when you wake up and every 2 hours until you get to the Short Stay unit. . If your blood sugar is less than 70 mg/dL, you will need to treat for low blood sugar: o Do not take insulin. o Treat a low blood sugar (less than 70 mg/dL) with  cup of clear juice (cranberry or apple), 4 glucose tablets, OR glucose gel. o Recheck blood sugar in 15 minutes after treatment (to make sure it is greater than 70 mg/dL). If your blood sugar is not greater than 70 mg/dL on recheck, call [phone] for further instructions. . Report your blood sugar to the short stay nurse when you get to Short Stay.  . If you are admitted to the hospital after surgery: o Your blood sugar will be checked by the staff and you will probably be given insulin after surgery (instead of oral diabetes medicines) to make sure you have good blood sugar levels. o The goal for blood sugar control after surgery is 80-180 mg/dL.              WHAT DO I DO ABOUT MY DIABETES MEDICATION?   Marland Kitchen. Do not take oral diabetes medicines (pills) the morning of surgery.          . The day of surgery, do not take other diabetes injectables, including Byetta (exenatide), Bydureon (exenatide ER), Victoza (liraglutide), or Trulicity (dulaglutide).  . If your CBG is greater than 220 mg/dL, you may take  of your sliding scale (correction) dose  of insulin.  Other Instructions:          Patient Signature:  Date:   Nurse Signature:  Date:   Reviewed and Endorsed by [facility] Patient Education Committee, August 2015   Do not wear jewelry, make-up or nail polish.  Do not wear lotions, powders,perfumes, or deoderant.  Do not shave 48 hours prior to surgery.    Do not bring valuables to the hospital.  [facility] is not responsible for any belongings or valuables.  Contacts, dentures or bridgework may not be worn into surgery.  Leave your suitcase in the car.  After surgery it may be brought to your room.  For patients  admitted to the hospital, discharge time will be determined by your treatment team.  Patients discharged the day of surgery will not be allowed to drive home.    [facility]  Before surgery, you can play an important role.  Because skin is not sterile, your skin needs to be as free of germs as possible.  You can reduce the number of germs on you skin by washing with CHG (chlorahexidine gluconate) soap before surgery.  CHG is an antiseptic cleaner which kills germs and bonds with the skin to continue killing germs even after washing.  Please DO NOT use if you have an allergy to CHG or antibacterial soaps.  If your skin becomes reddened/irritated stop using the CHG and inform your nurse when you arrive at Short Stay.  Do not shave (including legs and underarms) for at least 48 hours prior to the first CHG shower.  You may shave your face.  Please follow these instructions carefully:   1.  Shower with CHG Soap the night before surgery and the                                morning of Surgery.  2.  If you choose to wash your hair, wash your hair first as usual with your       normal shampoo.  3.  After you shampoo, rinse your hair and body thoroughly to remove the                      Shampoo.  4.  Use CHG as you would any other liquid soap.  You can apply chg directly       to the skin and wash gently with scrungie or a clean washcloth.  5.  Apply the CHG Soap to your body ONLY FROM THE NECK DOWN.        Do not use on open wounds or open sores.  Avoid contact with your eyes,       ears, mouth and genitals (private parts).  Wash genitals (private parts)       with your normal soap.  6.  Wash thoroughly, paying special attention to the area where your surgery        will be performed.  7.  Thoroughly rinse your body with warm water from the neck down.  8.  DO NOT shower/wash with your normal soap after using and rinsing off       the CHG Soap.  9.  Pat  yourself dry with a clean towel.            10.  Wear clean pajamas.            11.  Place clean sheets on your bed the night of your first shower and do not        sleep with pets.  Day of Surgery  Do not apply any lotions/deoderants the morning of surgery.  Please wear clean clothes to the hospital/surgery center.   Please read over the following fact sheets that you were given. Pain Booklet, MRSA Information and Surgical Site Infection Prevention

## 2015-12-21 NOTE — Progress Notes (Signed)
[facility] [address] [facility] [place]. Pt notified and verbalized understanding of instructions.

## 2015-12-21 NOTE — Progress Notes (Addendum)
Cardiologist denies  Medical Md is Dr.Teresa Darrick Huntsmanullo  Echo report in epic from 2017  [facility]  Heart cath denies  EKG in epic from 06-22-15  CXR denies in past yr

## 2015-12-21 NOTE — Progress Notes (Signed)
   12/21/15 0938  OBSTRUCTIVE SLEEP APNEA  Have you ever been diagnosed with sleep apnea through a sleep study? No  Do you snore loudly (loud enough to be heard through closed doors)?  1  Do you often feel tired, fatigued, or sleepy during the daytime (such as falling asleep during driving or talking to someone)? 0  Has anyone observed you stop breathing during your sleep? 0  Do you have, or are you being treated for high blood pressure? 1  BMI more than 35 kg/m2? 1  Age > 50 (1-yes) 1  Neck circumference greater than:Female 16 inches or larger, Female 17inches or larger? 1 103(16)  Female Gender (Yes=1) 0  Obstructive Sleep Apnea Score 5  Score 5 or greater  Results sent to PCP

## 2015-12-22 LAB — URINE CULTURE

## 2015-12-31 MED ORDER — DEXTROSE 5 % IV SOLN
3.0000 g | INTRAVENOUS | Status: AC
  Administered 2016-01-01: 3 g via INTRAVENOUS
  Filled 2015-12-31: qty 3000

## 2016-01-01 ENCOUNTER — Inpatient Hospital Stay (HOSPITAL_COMMUNITY): Payer: Medicare (Managed Care) | Admitting: Certified Registered Nurse Anesthetist

## 2016-01-01 ENCOUNTER — Encounter (HOSPITAL_COMMUNITY): Payer: Self-pay | Admitting: *Deleted

## 2016-01-01 ENCOUNTER — Inpatient Hospital Stay (HOSPITAL_COMMUNITY)
Admission: RE | Admit: 2016-01-01 | Discharge: 2016-01-04 | DRG: 470 | Disposition: A | Discharge status: Skilled Nursing Facility | Payer: Medicare (Managed Care) | Source: Ambulatory Visit | Attending: Orthopedic Surgery | Admitting: Orthopedic Surgery

## 2016-01-01 ENCOUNTER — Encounter (HOSPITAL_COMMUNITY)
Admission: RE | Disposition: A | Discharge status: Skilled Nursing Facility | Payer: Self-pay | Source: Ambulatory Visit | Attending: Orthopedic Surgery

## 2016-01-01 DIAGNOSIS — M1711 Unilateral primary osteoarthritis, right knee: Secondary | ICD-10-CM | POA: Diagnosis present

## 2016-01-01 DIAGNOSIS — E785 Hyperlipidemia, unspecified: Secondary | ICD-10-CM | POA: Diagnosis present

## 2016-01-01 DIAGNOSIS — Z7951 Long term (current) use of inhaled steroids: Secondary | ICD-10-CM

## 2016-01-01 DIAGNOSIS — N189 Chronic kidney disease, unspecified: Secondary | ICD-10-CM | POA: Diagnosis present

## 2016-01-01 DIAGNOSIS — F419 Anxiety disorder, unspecified: Secondary | ICD-10-CM | POA: Diagnosis present

## 2016-01-01 DIAGNOSIS — K760 Fatty (change of) liver, not elsewhere classified: Secondary | ICD-10-CM | POA: Diagnosis present

## 2016-01-01 DIAGNOSIS — E1122 Type 2 diabetes mellitus with diabetic chronic kidney disease: Secondary | ICD-10-CM | POA: Diagnosis present

## 2016-01-01 DIAGNOSIS — Z833 Family history of diabetes mellitus: Secondary | ICD-10-CM | POA: Diagnosis not present

## 2016-01-01 DIAGNOSIS — R3915 Urgency of urination: Secondary | ICD-10-CM | POA: Diagnosis present

## 2016-01-01 DIAGNOSIS — Z8673 Personal history of transient ischemic attack (TIA), and cerebral infarction without residual deficits: Secondary | ICD-10-CM | POA: Diagnosis present

## 2016-01-01 DIAGNOSIS — E1121 Type 2 diabetes mellitus with diabetic nephropathy: Secondary | ICD-10-CM | POA: Diagnosis present

## 2016-01-01 DIAGNOSIS — K219 Gastro-esophageal reflux disease without esophagitis: Secondary | ICD-10-CM | POA: Diagnosis present

## 2016-01-01 DIAGNOSIS — I129 Hypertensive chronic kidney disease with stage 1 through stage 4 chronic kidney disease, or unspecified chronic kidney disease: Secondary | ICD-10-CM | POA: Diagnosis present

## 2016-01-01 DIAGNOSIS — N3946 Mixed incontinence: Secondary | ICD-10-CM | POA: Diagnosis present

## 2016-01-01 DIAGNOSIS — Z96652 Presence of left artificial knee joint: Secondary | ICD-10-CM | POA: Diagnosis present

## 2016-01-01 DIAGNOSIS — M25561 Pain in right knee: Secondary | ICD-10-CM | POA: Diagnosis present

## 2016-01-01 DIAGNOSIS — H409 Unspecified glaucoma: Secondary | ICD-10-CM | POA: Diagnosis present

## 2016-01-01 DIAGNOSIS — M48061 Spinal stenosis, lumbar region without neurogenic claudication: Secondary | ICD-10-CM | POA: Diagnosis present

## 2016-01-01 DIAGNOSIS — Z96643 Presence of artificial hip joint, bilateral: Secondary | ICD-10-CM | POA: Diagnosis present

## 2016-01-01 DIAGNOSIS — I10 Essential (primary) hypertension: Secondary | ICD-10-CM | POA: Diagnosis present

## 2016-01-01 DIAGNOSIS — M5441 Lumbago with sciatica, right side: Secondary | ICD-10-CM | POA: Diagnosis present

## 2016-01-01 DIAGNOSIS — Z96651 Presence of right artificial knee joint: Secondary | ICD-10-CM

## 2016-01-01 DIAGNOSIS — Z6841 Body Mass Index (BMI) 40.0 and over, adult: Secondary | ICD-10-CM

## 2016-01-01 DIAGNOSIS — E039 Hypothyroidism, unspecified: Secondary | ICD-10-CM | POA: Diagnosis present

## 2016-01-01 DIAGNOSIS — E559 Vitamin D deficiency, unspecified: Secondary | ICD-10-CM | POA: Diagnosis present

## 2016-01-01 DIAGNOSIS — Z7982 Long term (current) use of aspirin: Secondary | ICD-10-CM

## 2016-01-01 DIAGNOSIS — G8929 Other chronic pain: Secondary | ICD-10-CM

## 2016-01-01 HISTORY — PX: TOTAL KNEE ARTHROPLASTY: SHX125

## 2016-01-01 HISTORY — DX: Unilateral primary osteoarthritis, right knee: M17.11

## 2016-01-01 LAB — CREATININE, SERUM
Creatinine, Ser: 0.77 mg/dL (ref 0.44–1.00)
GFR calc Af Amer: >60 mL/min (ref >60)
GFR calc non Af Amer: >60 mL/min (ref >60)

## 2016-01-01 LAB — CBC
HCT: 42.7 % (ref 36.0–46.0)
Hemoglobin: 13.7 g/dL (ref 12.0–15.0)
MCH: 29.8 pg (ref 26.0–34.0)
MCHC: 32.1 g/dL (ref 30.0–36.0)
MCV: 93 fL (ref 78.0–100.0)
Platelets: 279 10*3/uL (ref 150–400)
RBC: 4.59 MIL/uL (ref 3.87–5.11)
RDW: 13.6 % (ref 11.5–15.5)
WBC: 16.1 10*3/uL — ABNORMAL HIGH (ref 4.0–10.5)

## 2016-01-01 LAB — GLUCOSE, CAPILLARY
Glucose-Capillary: 133 mg/dL — ABNORMAL HIGH (ref 65–99)
Glucose-Capillary: 146 mg/dL — ABNORMAL HIGH (ref 65–99)
Glucose-Capillary: 211 mg/dL — ABNORMAL HIGH (ref 65–99)
Glucose-Capillary: 225 mg/dL — ABNORMAL HIGH (ref 65–99)

## 2016-01-01 SURGERY — ARTHROPLASTY, KNEE, TOTAL
Anesthesia: Regional | Site: Knee | Laterality: Right

## 2016-01-01 MED ORDER — ROSUVASTATIN CALCIUM 10 MG PO TABS
20.0000 mg | ORAL_TABLET | Freq: Every day | ORAL | Status: DC
  Administered 2016-01-02 – 2016-01-04 (×3): 20 mg via ORAL
  Filled 2016-01-01 (×3): qty 2

## 2016-01-01 MED ORDER — LEVOTHYROXINE SODIUM 100 MCG PO TABS
200.0000 ug | ORAL_TABLET | Freq: Every day | ORAL | Status: DC
  Administered 2016-01-02 – 2016-01-04 (×3): 200 ug via ORAL
  Filled 2016-01-01 (×3): qty 2

## 2016-01-01 MED ORDER — POLYETHYLENE GLYCOL 3350 17 G PO PACK
17.0000 g | PACK | Freq: Two times a day (BID) | ORAL | Status: DC
  Administered 2016-01-02: 17 g via ORAL
  Filled 2016-01-01 (×5): qty 1

## 2016-01-01 MED ORDER — HYDROMORPHONE HCL 1 MG/ML IJ SOLN
INTRAMUSCULAR | Status: AC
  Filled 2016-01-01: qty 2

## 2016-01-01 MED ORDER — ACETAMINOPHEN 325 MG PO TABS: 650.0000 mg | ORAL_TABLET | Freq: Four times a day (QID) | ORAL | Status: DC | PRN

## 2016-01-01 MED ORDER — LABETALOL HCL 5 MG/ML IV SOLN
INTRAVENOUS | Status: DC | PRN
  Administered 2016-01-01: 10 mg via INTRAVENOUS

## 2016-01-01 MED ORDER — METOPROLOL TARTRATE 5 MG/5ML IV SOLN
INTRAVENOUS | Status: AC
  Filled 2016-01-01: qty 5

## 2016-01-01 MED ORDER — FESOTERODINE FUMARATE ER 4 MG PO TB24
4.0000 mg | ORAL_TABLET | Freq: Every day | ORAL | Status: DC
  Administered 2016-01-02 – 2016-01-04 (×3): 4 mg via ORAL
  Filled 2016-01-01 (×3): qty 1

## 2016-01-01 MED ORDER — BUPIVACAINE-EPINEPHRINE 0.25% -1:200000 IJ SOLN
INTRAMUSCULAR | Status: DC | PRN
  Administered 2016-01-01: 30 mL

## 2016-01-01 MED ORDER — DEXAMETHASONE SODIUM PHOSPHATE 10 MG/ML IJ SOLN
INTRAMUSCULAR | Status: DC | PRN
  Administered 2016-01-01: 10 mg via INTRAVENOUS

## 2016-01-01 MED ORDER — METOCLOPRAMIDE HCL 5 MG PO TABS: 5.0000 mg | ORAL_TABLET | Freq: Three times a day (TID) | ORAL | Status: DC | PRN

## 2016-01-01 MED ORDER — OXYCODONE HCL 5 MG PO TABS: 5.0000 mg | ORAL_TABLET | Freq: Once | ORAL | Status: DC | PRN

## 2016-01-01 MED ORDER — EZETIMIBE 10 MG PO TABS
10.0000 mg | ORAL_TABLET | Freq: Every day | ORAL | Status: DC
  Administered 2016-01-02 – 2016-01-04 (×3): 10 mg via ORAL
  Filled 2016-01-01 (×3): qty 1

## 2016-01-01 MED ORDER — ACETAMINOPHEN 10 MG/ML IV SOLN
INTRAVENOUS | Status: AC
  Filled 2016-01-01: qty 100

## 2016-01-01 MED ORDER — LIDOCAINE 2% (20 MG/ML) 5 ML SYRINGE
INTRAMUSCULAR | Status: AC
  Filled 2016-01-01: qty 5

## 2016-01-01 MED ORDER — POTASSIUM CHLORIDE IN NACL 20-0.9 MEQ/L-% IV SOLN
INTRAVENOUS | Status: DC
  Filled 2016-01-01: qty 1000

## 2016-01-01 MED ORDER — ONDANSETRON HCL 4 MG/2ML IJ SOLN: 4.0000 mg | Freq: Once | INTRAMUSCULAR | Status: DC | PRN

## 2016-01-01 MED ORDER — INSULIN ASPART 100 UNIT/ML ~~LOC~~ SOLN
0.0000 [IU] | Freq: Three times a day (TID) | SUBCUTANEOUS | Status: DC
  Administered 2016-01-01 – 2016-01-02 (×2): 7 [IU] via SUBCUTANEOUS
  Administered 2016-01-02 – 2016-01-04 (×7): 4 [IU] via SUBCUTANEOUS
  Administered 2016-01-04: 3 [IU] via SUBCUTANEOUS

## 2016-01-01 MED ORDER — HYDROMORPHONE HCL 1 MG/ML IJ SOLN
1.0000 mg | INTRAMUSCULAR | Status: DC | PRN
  Administered 2016-01-02 – 2016-01-03 (×4): 1 mg via INTRAVENOUS
  Filled 2016-01-01 (×4): qty 1

## 2016-01-01 MED ORDER — ACETAMINOPHEN 10 MG/ML IV SOLN
1000.0000 mg | Freq: Once | INTRAVENOUS | Status: AC
  Administered 2016-01-01: 1000 mg via INTRAVENOUS

## 2016-01-01 MED ORDER — DIPHENHYDRAMINE HCL 12.5 MG/5ML PO ELIX: 12.5000 mg | ORAL_SOLUTION | ORAL | Status: DC | PRN

## 2016-01-01 MED ORDER — LABETALOL HCL 5 MG/ML IV SOLN
INTRAVENOUS | Status: AC
  Filled 2016-01-01: qty 4

## 2016-01-01 MED ORDER — DOCUSATE SODIUM 100 MG PO CAPS
100.0000 mg | ORAL_CAPSULE | Freq: Two times a day (BID) | ORAL | Status: DC
  Administered 2016-01-01 – 2016-01-04 (×6): 100 mg via ORAL
  Filled 2016-01-01 (×6): qty 1

## 2016-01-01 MED ORDER — 0.9 % SODIUM CHLORIDE (POUR BTL) OPTIME
TOPICAL | Status: DC | PRN
  Administered 2016-01-01: 1000 mL

## 2016-01-01 MED ORDER — MENTHOL 3 MG MT LOZG: 1.0000 | LOZENGE | OROMUCOSAL | Status: DC | PRN

## 2016-01-01 MED ORDER — CELECOXIB 200 MG PO CAPS
200.0000 mg | ORAL_CAPSULE | Freq: Two times a day (BID) | ORAL | Status: DC
  Administered 2016-01-01 – 2016-01-04 (×6): 200 mg via ORAL
  Filled 2016-01-01 (×6): qty 1

## 2016-01-01 MED ORDER — INSULIN ASPART 100 UNIT/ML ~~LOC~~ SOLN
0.0000 [IU] | Freq: Every day | SUBCUTANEOUS | Status: DC
  Administered 2016-01-01: 2 [IU] via SUBCUTANEOUS

## 2016-01-01 MED ORDER — SODIUM CHLORIDE 0.9 % IR SOLN
Status: DC | PRN
Start: 2016-01-01 — End: 2016-01-01
  Administered 2016-01-01: 3000 mL

## 2016-01-01 MED ORDER — HYDROMORPHONE HCL 1 MG/ML IJ SOLN: 0.2500 mg | INTRAMUSCULAR | Status: DC | PRN

## 2016-01-01 MED ORDER — FLUTICASONE PROPIONATE 50 MCG/ACT NA SUSP
2.0000 | Freq: Every day | NASAL | Status: DC
  Administered 2016-01-03 – 2016-01-04 (×2): 2 via NASAL
  Filled 2016-01-01: qty 16

## 2016-01-01 MED ORDER — ENOXAPARIN SODIUM 30 MG/0.3ML ~~LOC~~ SOLN
30.0000 mg | Freq: Two times a day (BID) | SUBCUTANEOUS | Status: DC
  Administered 2016-01-02 – 2016-01-04 (×5): 30 mg via SUBCUTANEOUS
  Filled 2016-01-01 (×5): qty 0.3

## 2016-01-01 MED ORDER — MIDAZOLAM HCL 2 MG/2ML IJ SOLN
INTRAMUSCULAR | Status: AC
  Filled 2016-01-01: qty 2

## 2016-01-01 MED ORDER — GABAPENTIN 300 MG PO CAPS
300.0000 mg | ORAL_CAPSULE | Freq: Two times a day (BID) | ORAL | Status: DC
  Administered 2016-01-01 – 2016-01-04 (×6): 300 mg via ORAL
  Filled 2016-01-01 (×6): qty 1

## 2016-01-01 MED ORDER — FENTANYL CITRATE (PF) 100 MCG/2ML IJ SOLN
INTRAMUSCULAR | Status: AC
Start: 2016-01-01 — End: 2016-01-01
  Filled 2016-01-01: qty 2

## 2016-01-01 MED ORDER — ALUM & MAG HYDROXIDE-SIMETH 200-200-20 MG/5ML PO SUSP: 30.0000 mL | ORAL | Status: DC | PRN

## 2016-01-01 MED ORDER — PANTOPRAZOLE SODIUM 40 MG PO TBEC
40.0000 mg | DELAYED_RELEASE_TABLET | Freq: Every day | ORAL | Status: DC
  Administered 2016-01-02 – 2016-01-04 (×3): 40 mg via ORAL
  Filled 2016-01-01 (×3): qty 1

## 2016-01-01 MED ORDER — LIRAGLUTIDE 18 MG/3ML ~~LOC~~ SOPN: 0.6000 mg | PEN_INJECTOR | Freq: Every day | SUBCUTANEOUS | Status: DC

## 2016-01-01 MED ORDER — FENTANYL CITRATE (PF) 100 MCG/2ML IJ SOLN
INTRAMUSCULAR | Status: AC
  Filled 2016-01-01: qty 2

## 2016-01-01 MED ORDER — PHENOL 1.4 % MT LIQD: 1.0000 | OROMUCOSAL | Status: DC | PRN

## 2016-01-01 MED ORDER — OXYCODONE HCL 5 MG PO TABS
15.0000 mg | ORAL_TABLET | ORAL | Status: DC | PRN
  Administered 2016-01-01 – 2016-01-02 (×5): 15 mg via ORAL
  Filled 2016-01-01 (×5): qty 3

## 2016-01-01 MED ORDER — HYDROMORPHONE HCL 1 MG/ML IJ SOLN
INTRAMUSCULAR | Status: DC | PRN
  Administered 2016-01-01: 0.5 mg via INTRAVENOUS

## 2016-01-01 MED ORDER — PHENYLEPHRINE 40 MCG/ML (10ML) SYRINGE FOR IV PUSH (FOR BLOOD PRESSURE SUPPORT)
PREFILLED_SYRINGE | INTRAVENOUS | Status: AC
  Filled 2016-01-01: qty 10

## 2016-01-01 MED ORDER — ONDANSETRON HCL 4 MG/2ML IJ SOLN
4.0000 mg | Freq: Once | INTRAMUSCULAR | Status: DC | PRN
Start: 2016-01-01 — End: 2016-01-01

## 2016-01-01 MED ORDER — DEXAMETHASONE SODIUM PHOSPHATE 10 MG/ML IJ SOLN
10.0000 mg | Freq: Three times a day (TID) | INTRAMUSCULAR | Status: AC
  Administered 2016-01-01 – 2016-01-02 (×4): 10 mg via INTRAVENOUS
  Filled 2016-01-01 (×4): qty 1

## 2016-01-01 MED ORDER — ONDANSETRON HCL 4 MG/2ML IJ SOLN
INTRAMUSCULAR | Status: AC
  Filled 2016-01-01: qty 2

## 2016-01-01 MED ORDER — FENTANYL CITRATE (PF) 100 MCG/2ML IJ SOLN
INTRAMUSCULAR | Status: AC
  Filled 2016-01-01: qty 4

## 2016-01-01 MED ORDER — INSULIN ASPART 100 UNIT/ML ~~LOC~~ SOLN
6.0000 [IU] | Freq: Three times a day (TID) | SUBCUTANEOUS | Status: DC
  Administered 2016-01-01 – 2016-01-04 (×10): 6 [IU] via SUBCUTANEOUS

## 2016-01-01 MED ORDER — LACTATED RINGERS IV SOLN
INTRAVENOUS | Status: DC
  Administered 2016-01-01 (×3): via INTRAVENOUS

## 2016-01-01 MED ORDER — MIDAZOLAM HCL 2 MG/2ML IJ SOLN
2.0000 mg | Freq: Once | INTRAMUSCULAR | Status: AC
  Administered 2016-01-01: 2 mg via INTRAVENOUS

## 2016-01-01 MED ORDER — OXYCODONE HCL 5 MG/5ML PO SOLN: 5.0000 mg | Freq: Once | ORAL | Status: DC | PRN

## 2016-01-01 MED ORDER — ROCURONIUM BROMIDE 10 MG/ML (PF) SYRINGE
PREFILLED_SYRINGE | INTRAVENOUS | Status: AC
  Filled 2016-01-01: qty 10

## 2016-01-01 MED ORDER — SUGAMMADEX SODIUM 500 MG/5ML IV SOLN
INTRAVENOUS | Status: DC | PRN
  Administered 2016-01-01: 273 mg via INTRAVENOUS

## 2016-01-01 MED ORDER — DEXAMETHASONE SODIUM PHOSPHATE 10 MG/ML IJ SOLN
INTRAMUSCULAR | Status: AC
  Filled 2016-01-01: qty 1

## 2016-01-01 MED ORDER — ONDANSETRON HCL 4 MG/2ML IJ SOLN
INTRAMUSCULAR | Status: DC | PRN
  Administered 2016-01-01: 4 mg via INTRAVENOUS

## 2016-01-01 MED ORDER — HYDROMORPHONE HCL 1 MG/ML IJ SOLN
0.2500 mg | INTRAMUSCULAR | Status: DC | PRN
  Administered 2016-01-01 (×3): 0.5 mg via INTRAVENOUS

## 2016-01-01 MED ORDER — ACETAMINOPHEN 650 MG RE SUPP: 650.0000 mg | Freq: Four times a day (QID) | RECTAL | Status: DC | PRN

## 2016-01-01 MED ORDER — BUPIVACAINE-EPINEPHRINE 0.25% -1:200000 IJ SOLN
INTRAMUSCULAR | Status: AC
  Filled 2016-01-01: qty 1

## 2016-01-01 MED ORDER — DEXMEDETOMIDINE HCL IN NACL 200 MCG/50ML IV SOLN
INTRAVENOUS | Status: DC | PRN
  Administered 2016-01-01: 100 ug via INTRAVENOUS

## 2016-01-01 MED ORDER — PROPOFOL 10 MG/ML IV BOLUS
INTRAVENOUS | Status: DC | PRN
  Administered 2016-01-01: 190 mg via INTRAVENOUS

## 2016-01-01 MED ORDER — AMLODIPINE BESYLATE 5 MG PO TABS
5.0000 mg | ORAL_TABLET | Freq: Every day | ORAL | Status: DC
  Administered 2016-01-03 – 2016-01-04 (×2): 5 mg via ORAL
  Filled 2016-01-01 (×2): qty 1

## 2016-01-01 MED ORDER — SODIUM CHLORIDE 0.9 % IV SOLN
1500.0000 mg | INTRAVENOUS | Status: AC
  Administered 2016-01-01: 1500 mg via INTRAVENOUS
  Filled 2016-01-01: qty 1500

## 2016-01-01 MED ORDER — METOCLOPRAMIDE HCL 5 MG/ML IJ SOLN: 5.0000 mg | Freq: Three times a day (TID) | INTRAMUSCULAR | Status: DC | PRN

## 2016-01-01 MED ORDER — ROCURONIUM BROMIDE 10 MG/ML (PF) SYRINGE
PREFILLED_SYRINGE | INTRAVENOUS | Status: DC | PRN
  Administered 2016-01-01: 20 mg via INTRAVENOUS
  Administered 2016-01-01: 50 mg via INTRAVENOUS

## 2016-01-01 MED ORDER — PHENYLEPHRINE 40 MCG/ML (10ML) SYRINGE FOR IV PUSH (FOR BLOOD PRESSURE SUPPORT)
PREFILLED_SYRINGE | INTRAVENOUS | Status: DC | PRN
  Administered 2016-01-01 (×2): 40 ug via INTRAVENOUS

## 2016-01-01 MED ORDER — LIDOCAINE 2% (20 MG/ML) 5 ML SYRINGE
INTRAMUSCULAR | Status: DC | PRN
  Administered 2016-01-01: 60 mg via INTRAVENOUS

## 2016-01-01 MED ORDER — VANCOMYCIN HCL IN DEXTROSE 1-5 GM/200ML-% IV SOLN
1000.0000 mg | Freq: Two times a day (BID) | INTRAVENOUS | Status: AC
  Administered 2016-01-01: 1000 mg via INTRAVENOUS
  Filled 2016-01-01 (×2): qty 200

## 2016-01-01 MED ORDER — OXYCODONE HCL ER 20 MG PO T12A
20.0000 mg | EXTENDED_RELEASE_TABLET | Freq: Two times a day (BID) | ORAL | Status: DC
  Administered 2016-01-01: 20 mg via ORAL
  Filled 2016-01-01: qty 1

## 2016-01-01 MED ORDER — CYCLOSPORINE 0.05 % OP EMUL
1.0000 [drp] | Freq: Two times a day (BID) | OPHTHALMIC | Status: DC
  Administered 2016-01-02 – 2016-01-04 (×5): 1 [drp] via OPHTHALMIC
  Filled 2016-01-01 (×8): qty 1

## 2016-01-01 MED ORDER — ONDANSETRON HCL 4 MG PO TABS: 4.0000 mg | ORAL_TABLET | Freq: Four times a day (QID) | ORAL | Status: DC | PRN

## 2016-01-01 MED ORDER — CEFUROXIME SODIUM 1.5 G IJ SOLR
INTRAMUSCULAR | Status: AC
  Filled 2016-01-01: qty 1.5

## 2016-01-01 MED ORDER — ONDANSETRON HCL 4 MG/2ML IJ SOLN: 4.0000 mg | Freq: Four times a day (QID) | INTRAMUSCULAR | Status: DC | PRN

## 2016-01-01 MED ORDER — FENTANYL CITRATE (PF) 100 MCG/2ML IJ SOLN
INTRAMUSCULAR | Status: DC | PRN
  Administered 2016-01-01 (×6): 50 ug via INTRAVENOUS
  Administered 2016-01-01: 100 ug via INTRAVENOUS

## 2016-01-01 MED ORDER — DULOXETINE HCL 30 MG PO CPEP
30.0000 mg | ORAL_CAPSULE | Freq: Two times a day (BID) | ORAL | Status: DC
  Administered 2016-01-01 – 2016-01-04 (×6): 30 mg via ORAL
  Filled 2016-01-01 (×6): qty 1

## 2016-01-01 MED ORDER — HYDROMORPHONE HCL 1 MG/ML IJ SOLN
INTRAMUSCULAR | Status: AC
  Filled 2016-01-01: qty 1

## 2016-01-01 MED ORDER — PROPOFOL 10 MG/ML IV BOLUS
INTRAVENOUS | Status: AC
  Filled 2016-01-01: qty 40

## 2016-01-01 MED ORDER — SUGAMMADEX SODIUM 500 MG/5ML IV SOLN
INTRAVENOUS | Status: AC
  Filled 2016-01-01: qty 5

## 2016-01-01 MED ORDER — CEFAZOLIN SODIUM-DEXTROSE 2-4 GM/100ML-% IV SOLN: 2.0000 g | Freq: Four times a day (QID) | INTRAVENOUS | Status: DC

## 2016-01-01 MED ORDER — FENTANYL CITRATE (PF) 100 MCG/2ML IJ SOLN
100.0000 ug | Freq: Once | INTRAMUSCULAR | Status: AC
  Administered 2016-01-01: 100 ug via INTRAVENOUS

## 2016-01-01 SURGICAL SUPPLY — 70 items
BANDAGE ESMARK 6X9 LF (GAUZE/BANDAGES/DRESSINGS) ×1 IMPLANT
BENZOIN TINCTURE PRP APPL 2/3 (GAUZE/BANDAGES/DRESSINGS) ×2 IMPLANT
BLADE SAGITTAL 25.0X1.19X90 (BLADE) ×2 IMPLANT
BLADE SAW SGTL 13X75X1.27 (BLADE) ×2 IMPLANT
BLADE SURG 10 STRL SS (BLADE) ×4 IMPLANT
BNDG ELASTIC 6X15 VLCR STRL LF (GAUZE/BANDAGES/DRESSINGS) ×2 IMPLANT
BNDG ESMARK 6X9 LF (GAUZE/BANDAGES/DRESSINGS) ×2
BONE CEMENT GENTAMICIN (Cement) ×4 IMPLANT
BOWL SMART MIX CTS (DISPOSABLE) ×2 IMPLANT
CAP KNEE TOTAL 3 SIGMA ×2 IMPLANT
CEMENT BONE GENTAMICIN 40 (Cement) ×2 IMPLANT
CEMENT HV SMART SET (Cement) IMPLANT
CLSR STERI-STRIP ANTIMIC 1/2X4 (GAUZE/BANDAGES/DRESSINGS) ×2 IMPLANT
COVER SURGICAL LIGHT HANDLE (MISCELLANEOUS) ×2 IMPLANT
CUFF TOURNIQUET SINGLE 34IN LL (TOURNIQUET CUFF) IMPLANT
CUFF TOURNIQUET SINGLE 44IN (TOURNIQUET CUFF) ×2 IMPLANT
DECANTER SPIKE VIAL GLASS SM (MISCELLANEOUS) ×2 IMPLANT
DRAPE EXTREMITY T 121X128X90 (DRAPE) ×2 IMPLANT
DRAPE INCISE IOBAN 66X45 STRL (DRAPES) ×2 IMPLANT
DRAPE PROXIMA HALF (DRAPES) ×2 IMPLANT
DRAPE U-SHAPE 47X51 STRL (DRAPES) ×2 IMPLANT
DRSG AQUACEL AG ADV 3.5X14 (GAUZE/BANDAGES/DRESSINGS) ×2 IMPLANT
DURAPREP 26ML APPLICATOR (WOUND CARE) ×2 IMPLANT
ELECT CAUTERY BLADE 6.4 (BLADE) ×2 IMPLANT
ELECT REM PT RETURN 9FT ADLT (ELECTROSURGICAL) ×2
ELECTRODE REM PT RTRN 9FT ADLT (ELECTROSURGICAL) ×1 IMPLANT
FACESHIELD WRAPAROUND (MASK) ×2 IMPLANT
GLOVE BIO SURGEON STRL SZ7 (GLOVE) ×2 IMPLANT
GLOVE BIOGEL PI IND STRL 7.0 (GLOVE) ×1 IMPLANT
GLOVE BIOGEL PI IND STRL 7.5 (GLOVE) ×1 IMPLANT
GLOVE BIOGEL PI INDICATOR 7.0 (GLOVE) ×1
GLOVE BIOGEL PI INDICATOR 7.5 (GLOVE) ×1
GLOVE SS BIOGEL STRL SZ 7.5 (GLOVE) ×1 IMPLANT
GLOVE SUPERSENSE BIOGEL SZ 7.5 (GLOVE) ×1
GOWN STRL REUS W/ TWL LRG LVL3 (GOWN DISPOSABLE) ×1 IMPLANT
GOWN STRL REUS W/ TWL XL LVL3 (GOWN DISPOSABLE) ×2 IMPLANT
GOWN STRL REUS W/TWL LRG LVL3 (GOWN DISPOSABLE) ×1
GOWN STRL REUS W/TWL XL LVL3 (GOWN DISPOSABLE) ×2
HANDPIECE INTERPULSE COAX TIP (DISPOSABLE) ×1
HOOD PEEL AWAY FACE SHEILD DIS (HOOD) ×4 IMPLANT
IMMOBILIZER KNEE 22 UNIV (SOFTGOODS) ×2 IMPLANT
KIT BASIN OR (CUSTOM PROCEDURE TRAY) ×2 IMPLANT
KIT ROOM TURNOVER OR (KITS) ×2 IMPLANT
MANIFOLD NEPTUNE II (INSTRUMENTS) ×2 IMPLANT
MARKER SKIN DUAL TIP RULER LAB (MISCELLANEOUS) ×2 IMPLANT
NEEDLE 18GX1X1/2 (RX/OR ONLY) (NEEDLE) ×2 IMPLANT
NS IRRIG 1000ML POUR BTL (IV SOLUTION) ×2 IMPLANT
PACK TOTAL JOINT (CUSTOM PROCEDURE TRAY) ×2 IMPLANT
PAD ARMBOARD 7.5X6 YLW CONV (MISCELLANEOUS) ×4 IMPLANT
SET HNDPC FAN SPRY TIP SCT (DISPOSABLE) ×1 IMPLANT
STRIP CLOSURE SKIN 1/2X4 (GAUZE/BANDAGES/DRESSINGS) ×2 IMPLANT
SUCTION FRAZIER HANDLE 10FR (MISCELLANEOUS) ×1
SUCTION FRAZIER TIP 8 FR DISP (SUCTIONS) ×1
SUCTION TUBE FRAZIER 10FR DISP (MISCELLANEOUS) ×1 IMPLANT
SUCTION TUBE FRAZIER 8FR DISP (SUCTIONS) ×1 IMPLANT
SUT MNCRL AB 3-0 PS2 18 (SUTURE) ×2 IMPLANT
SUT VIC AB 0 CT1 27 (SUTURE) ×4
SUT VIC AB 0 CT1 27XBRD ANBCTR (SUTURE) ×4 IMPLANT
SUT VIC AB 1 CT1 27 (SUTURE) ×1
SUT VIC AB 1 CT1 27XBRD ANBCTR (SUTURE) ×1 IMPLANT
SUT VIC AB 2-0 CT1 27 (SUTURE) ×2
SUT VIC AB 2-0 CT1 TAPERPNT 27 (SUTURE) ×2 IMPLANT
SYR 30ML LL (SYRINGE) ×2 IMPLANT
TOWEL OR 17X24 6PK STRL BLUE (TOWEL DISPOSABLE) ×2 IMPLANT
TOWEL OR 17X26 10 PK STRL BLUE (TOWEL DISPOSABLE) ×2 IMPLANT
TRAY FOLEY BAG SILVER LF 16FR (CATHETERS) ×2 IMPLANT
TRAY FOLEY CATH 16FR SILVER (SET/KITS/TRAYS/PACK) IMPLANT
TUBE CONNECTING 12X1/4 (SUCTIONS) ×2 IMPLANT
UPCHARGE REV TRAY MBT KNEE ×2 IMPLANT
YANKAUER SUCT BULB TIP NO VENT (SUCTIONS) ×2 IMPLANT

## 2016-01-01 NOTE — Anesthesia Postprocedure Evaluation (Signed)
Anesthesia Post Note  Patient: Bonnie GashElizabeth A Gerardo  Procedure(s) Performed: Procedure(s) (LRB): TOTAL KNEE ARTHROPLASTY (Right)  Patient location during evaluation: PACU Anesthesia Type: General Level of consciousness: awake, awake and alert and oriented Pain management: pain level controlled Vital Signs Assessment: post-procedure vital signs reviewed and stable Respiratory status: spontaneous breathing, nonlabored ventilation and respiratory function stable Cardiovascular status: blood pressure returned to baseline Anesthetic complications: no    Last Vitals:  Vitals:   01/01/16 1325 01/01/16 1340  BP: (!) 143/71 (!) 144/68  Pulse: 85 83  Resp: 17 14  Temp:      Last Pain:  Vitals:   01/01/16 1240  TempSrc:   PainSc: Asleep                 [name]

## 2016-01-01 NOTE — Transfer of Care (Signed)
Immediate Anesthesia Transfer of Care Note  Patient: Bonnie Hinton  Procedure(s) Performed: Procedure(s): TOTAL KNEE ARTHROPLASTY (Right)  Patient Location: PACU  Anesthesia Type:GA combined with regional for post-op pain  Level of Consciousness: awake, alert , oriented and patient cooperative  Airway & Oxygen Therapy: Patient Spontanous Breathing and Patient connected to face mask oxygen  Post-op Assessment: Report given to RN and Post -op Vital signs reviewed and stable  Post vital signs: Reviewed and stable  Last Vitals:  Vitals:   01/01/16 0850 01/01/16 1240  BP: (!) 154/57   Pulse: 89   Resp: 19   Temp:  (P) 36.6 C    Last Pain:  Vitals:   01/01/16 1240  TempSrc:   PainSc: (P) Asleep      Patients Stated Pain Goal: 3 (01/01/16 0751)  Complications: No apparent anesthesia complications

## 2016-01-01 NOTE — Evaluation (Signed)
Physical Therapy Evaluation Patient Details Name: Bonnie Hinton MRN: [id number] DOB: 1940/09/11 Today's Date: 01/01/2016   History of Present Illness  Admitted for RTKA (WBAT, KI);  has a past medical history of Anxiety; Arthritis; Chronic back pain; Complication of anesthesia; GERD (gastroesophageal reflux disease); Glaucoma; History of bronchitis; History of MRSA infection (2004); Hyperlipidemia; Hypertension; Hypothyroidism; Joint pain; Joint swelling; Neuropathy (HCC); Peripheral edema; Pneumonia; Pre-diabetes; Primary localized osteoarthritis of right knee; Subdural hematoma (HCC) (8 yrs ago); Urinary frequency; and Urinary urgency.  Clinical Impression   Pt is s/p TKA resulting in the deficits listed below (see PT Problem List).  Pt will benefit from skilled PT to increase their independence and safety with mobility to allow discharge to the venue listed below.      Follow Up Recommendations Home health PT;Supervision/Assistance - 24 hour    Equipment Recommendations  3in1 (PT)    Recommendations for Other Services OT consult     Precautions / Restrictions Precautions Precautions: Knee Precaution Booklet Issued: Yes (comment)Pt educated to not allow any pillow or bolster under knee for healing with optimal range of motion.  Required Braces or Orthoses: Knee Immobilizer - Right Knee Immobilizer - Left: On when out of bed or walking;Discontinue once straight leg raise with < 10 degree lag Restrictions Weight Bearing Restrictions: Yes RLE Weight Bearing: Weight bearing as tolerated      Mobility  Bed Mobility Overal bed mobility: Needs Assistance Bed Mobility: Supine to Sit     Supine to sit: Min assist     General bed mobility comments: Min handheld assist to pull to sit  Transfers Overall transfer level: Needs assistance Equipment used: Rolling walker (2 wheeled) Transfers: Sit to/from Stand Sit to Stand: Mod assist         General transfer comment: Mod  assist for initial rise and to steady  Ambulation/Gait Ambulation/Gait assistance: +2 safety/equipment;Min assist Ambulation Distance (Feet): 8 Feet Assistive device: Rolling walker (2 wheeled) Gait Pattern/deviations: Step-to pattern;Decreased step length - left;Decreased stance time - right     General Gait Details: Cues for gait sequence and posture, as well as for R stance stability  Stairs            Wheelchair Mobility    Modified Rankin (Stroke Patients Only)       Balance                                             Pertinent Vitals/Pain Pain Assessment: 0-10 Pain Score: 5  Pain Location: R knee Pain Descriptors / Indicators: Aching;Sore Pain Intervention(s): Monitored during session;RN gave pain meds during session    Home Living Family/patient expects to be discharged to:: Private residence Living Arrangements: Alone Available Help at Discharge: Family;Available 24 hours/day (Daughter (who is a PTA) plans to stay with her) Type of Home: House Home Access: Level entry     Home Layout: One level Home Equipment: Walker - 2 wheels      Prior Function Level of Independence: Independent               Hand Dominance        Extremity/Trunk Assessment   Upper Extremity Assessment: Overall WFL for tasks assessed           Lower Extremity Assessment: RLE deficits/detail RLE Deficits / Details: Painful, but good quad activation and able to straight leg raise;  flex to at least 70 deg       Communication   Communication: No difficulties  Cognition Arousal/Alertness: Awake/alert Behavior During Therapy: WFL for tasks assessed/performed Overall Cognitive Status: Within Functional Limits for tasks assessed                      General Comments      Exercises     Assessment/Plan    PT Assessment Patient needs continued PT services  PT Problem List Decreased strength;Decreased range of motion;Decreased  activity tolerance;Decreased balance;Decreased mobility;Decreased coordination;Decreased knowledge of use of DME;Decreased safety awareness;Decreased knowledge of precautions;Pain          PT Treatment Interventions DME instruction;Functional mobility training;Therapeutic activities;Therapeutic exercise;Stair training;Gait training;Balance training;Patient/family education    PT Goals (Current goals can be found in the Care Plan section)  Acute Rehab PT Goals Patient Stated Goal: back to pool walkign PT Goal Formulation: With patient Time For Goal Achievement: 01/08/16 Potential to Achieve Goals: Good    Frequency 7X/week   Barriers to discharge        Co-evaluation               End of Session Equipment Utilized During Treatment: Gait belt;Right knee immobilizer Activity Tolerance: Patient tolerated treatment well Patient left: in chair;with call bell/phone within reach;with family/visitor present Nurse Communication: Mobility status         Time: 1520-1545 PT Time Calculation (min) (ACUTE ONLY): 25 min   Charges:   PT Evaluation $PT Eval Moderate Complexity: 1 Procedure PT Treatments $Gait Training: 8-22 mins   PT G CodesOlen Pel:        Mohamedamin Nifong Hamff 01/01/2016, 4:59 PM  Van ClinesHolly Rily Nickey, [place]PT  Acute Rehabilitation Services Pager [phone] Office [phone]

## 2016-01-01 NOTE — Anesthesia Procedure Notes (Signed)
Procedure Name: Intubation Date/Time: 01/01/2016 10:09 AM Performed by: Annabelle HarmanSMITH, Dareth Andrew A Pre-anesthesia Checklist: Patient identified, Emergency Drugs available, Suction available and Patient being monitored Patient Re-evaluated:Patient Re-evaluated prior to inductionOxygen Delivery Method: Circle system utilized Preoxygenation: Pre-oxygenation with 100% oxygen Intubation Type: IV induction Ventilation: Mask ventilation without difficulty and Oral airway inserted - appropriate to patient size Laryngoscope Size: Hyacinth MeekerMiller and 2 Grade View: Grade II Tube size: 7.0 mm Number of attempts: 1 Airway Equipment and Method: Stylet Placement Confirmation: ETT inserted through vocal cords under direct vision,  positive ETCO2 and breath sounds checked- equal and bilateral Secured at: 22 cm Tube secured with: Tape Dental Injury: Teeth and Oropharynx as per pre-operative assessment

## 2016-01-01 NOTE — Progress Notes (Signed)
Orthopedic Tech Progress Note Patient Details:  Bonnie Hinton 10/04/1940 [id number]  CPM Right Knee CPM Right Knee: On Right Knee Flexion (Degrees): 90 Right Knee Extension (Degrees): 0 Additional Comments: trapeze bar patient helper   Nikki Dom 01/01/2016, 1:10 PM Viewed order from doctor's order list

## 2016-01-01 NOTE — Op Note (Signed)
MRN:     [id number] DOB/AGE:    [birth year] / 75 y.o.       OPERATIVE REPORT    DATE OF PROCEDURE:  01/01/2016       PREOPERATIVE DIAGNOSIS:   Primary localized OA right knee      Estimated body mass index is 47.14 kg/m as calculated from the following:   Height as of 12/21/15: 5\' 7"  (1.702 m).   Weight as of this encounter: 136.5 kg (301 lb).                                                        POSTOPERATIVE DIAGNOSIS:   same                                                                      PROCEDURE:  Procedure(s): TOTAL KNEE ARTHROPLASTY Using Depuy Sigma RP implants #3 Femur, #3 MBT revisionTibia, 12.27mm  RP bearing, 32 Patella     SURGEON: Malashia Kamaka A    ASSISTANT:  Kirstin Shepperson PA-C   (Present and scrubbed throughout the case, critical for assistance with exposure, retraction, instrumentation, and closure.)         ANESTHESIA: GET with Adductor Nerve Block     TOURNIQUET TIME:   COMPLICATIONS:  None     SPECIMENS: None   INDICATIONS FOR PROCEDURE: The patient has  degenerative joint disease right knee, varus deformities, XR shows bone on bone arthritis. Patient has failed all conservative measures including anti-inflammatory medicines, narcotics, attempts at  exercise and weight loss, cortisone injections and viscosupplementation.  Risks and benefits of surgery have been discussed, questions answered.   DESCRIPTION OF PROCEDURE: The patient identified by armband, received  right femoral nerve block and IV antibiotics, in the holding area at [facility]. Patient taken to the operating room, appropriate anesthetic  monitors were attached General endotracheal anesthesia induced with  the patient in supine position, Foley catheter was inserted. Tourniquet  applied high to the operative thigh. Lateral post and foot positioner  applied to the table, the lower extremity was then prepped and draped  in usual sterile fashion from the ankle to the  tourniquet. Time-out procedure was performed. The limb was wrapped with an Esmarch bandage and the tourniquet inflated to 365 mmHg. We began the operation by making the anterior midline incision starting at handbreadth above the patella going over the patella 1 cm medial to and  4 cm distal to the tibial tubercle. Small bleeders in the skin and the  subcutaneous tissue identified and cauterized. Transverse retinaculum was incised and reflected medially and a medial parapatellar arthrotomy was accomplished. the patella was everted and theprepatellar fat pad resected. The superficial medial collateral  ligament was then elevated from anterior to posterior along the proximal  flare of the tibia and anterior half of the menisci resected. The knee was hyperflexed exposing bone on bone arthritis. Peripheral and notch osteophytes as well as the cruciate ligaments were then resected. We continued to  work our way around posteriorly along the proximal tibia, and externally  rotated the  tibia subluxing it out from underneath the femur. A McHale  retractor was placed through the notch and a lateral Hohmann retractor  placed, and we then drilled through the proximal tibia in line with the  axis of the tibia followed by an intramedullary guide rod and 2-degree  posterior slope cutting guide. The tibial cutting guide was pinned into place  allowing resection of 6 mm of bone medially and about 4 mm of bone  laterally because of her valgus deformity. Satisfied with the tibial resection, we then  entered the distal femur 2 mm anterior to the PCL origin with the  intramedullary guide rod and applied the distal femoral cutting guide  set at 11mm, with 5 degrees of valgus. This was pinned along the  epicondylar axis. At this point, the distal femoral cut was accomplished without difficulty. We then sized for a #3 femoral component and pinned the guide in 3 degrees of external rotation.The chamfer cutting guide was  pinned into place. The anterior, posterior, and chamfer cuts were accomplished without difficulty followed by  the  RP box cutting guide and the box cut. We also removed posterior osteophytes from the posterior femoral condyles. At this  time, the knee was brought into full extension. We checked our  extension and flexion gaps and found them symmetric at 12.285mm.  The patella thickness measured at 25 mm. We set the cutting guide at 15 and removed the posterior 9.5-10 mm  of the patella sized for 32 button and drilled the lollipop. The knee  was then once again hyperflexed exposing the proximal tibia. We sized for a #3 MBT revision  tibial base plate, applied the smokestack and the conical reamer followed by the the Delta fin keel punch. We then hammered into place the  RP trial femoral component, inserted a 1 trial bearing, trial patellar button, and took the knee through range of motion from 0-130 degrees. No thumb pressure was required for patellar  tracking. At this point, all trial components were removed, a double batch of DePuy HV cement with 1500 mg of Gentamycin was mixed and applied to all bony metallic mating surfaces except for the posterior condyles of the femur itself. In order, we  hammered into place the tibial tray and removed excess cement, the femoral component and removed excess cement, a 12.985mm  RP bearing  was inserted, and the knee brought to full extension with compression.  The patellar button was clamped into place, and excess cement  removed. While the cement cured the wound was irrigated out with normal saline solution pulse lavage.. Ligament stability and patellar tracking were checked and found to be excellent.. The parapatellar arthrotomy was closed with  #1 Vicryl suture. The subcutaneous tissue with 0 and 2-0 undyed  Vicryl suture, and 4-0 Monocryl.. A dressing of Aquaseal,  4 x 4, dressing sponges, Webril, and Ace wrap applied. Needle and sponge count were correct times  2.The patient awakened, extubated, and taken to recovery room without difficulty. Vascular status was normal, pulses 2+ and symmetric.   Nisha Dhami A 01/01/2016, 11:52 AM

## 2016-01-01 NOTE — Anesthesia Procedure Notes (Addendum)
Anesthesia Regional Block:  Adductor canal block  Pre-Anesthetic Checklist: ,, timeout performed, Correct Patient, Correct Site, Correct Laterality, Correct Procedure, Correct Position, site marked, Risks and benefits discussed,  Surgical consent,  Pre-op evaluation,  At surgeon's request and post-op pain management  Laterality: Right  Prep: Maximum Sterile Barrier Precautions used, chloraprep       Adductor canal block Narrative:  Start time: 01/01/2016 8:20 AM End time: 01/01/2016 8:25 AM Injection made incrementally with aspirations every 5 mL.  Performed by: Personally   Additional Notes: 30 cc 0.5% Bupivacaine with 1:200 epi injected easily

## 2016-01-01 NOTE — Interval H&P Note (Signed)
History and Physical Interval Note:  01/01/2016 9:51 AM  Bonnie Hinton  has presented today for surgery, with the diagnosis of djd right knee  The various methods of treatment have been discussed with the patient and family. After consideration of risks, benefits and other options for treatment, the patient has consented to  Procedure(s): TOTAL KNEE ARTHROPLASTY (Right) as a surgical intervention .  The patient's history has been reviewed, patient examined, no change in status, stable for surgery.  I have reviewed the patient's chart and labs.  Questions were answered to the patient's satisfaction.     Salvatore MarvelWAINER,Marketta Valadez A

## 2016-01-01 NOTE — Anesthesia Preprocedure Evaluation (Addendum)
Anesthesia Evaluation  Patient identified by MRN, date of birth, ID band Patient awake    Reviewed: Allergy & Precautions, NPO status , Patient's Chart, lab work & pertinent test results  Airway Mallampati: II  TM Distance: >3 FB Neck ROM: Full    Dental  (+) Teeth Intact, Dental Advisory Given   Pulmonary    breath sounds clear to auscultation       Cardiovascular hypertension,  Rhythm:Regular Rate:Normal     Neuro/Psych    GI/Hepatic   Endo/Other    Renal/GU      Musculoskeletal   Abdominal (+) + obese,   Peds  Hematology   Anesthesia Other Findings   Reproductive/Obstetrics                            Anesthesia Physical Anesthesia Plan  ASA: III  Anesthesia Plan: General and Regional   Post-op Pain Management:    Induction: Intravenous  Airway Management Planned: Oral ETT  Additional Equipment:   Intra-op Plan:   Post-operative Plan: Extubation in OR  Informed Consent: I have reviewed the patients History and Physical, chart, labs and discussed the procedure including the risks, benefits and alternatives for the proposed anesthesia with the patient or authorized representative who has indicated his/her understanding and acceptance.   Dental advisory given  Plan Discussed with: CRNA and Anesthesiologist  Anesthesia Plan Comments:         Anesthesia Quick Evaluation

## 2016-01-02 ENCOUNTER — Encounter (HOSPITAL_COMMUNITY): Payer: Self-pay

## 2016-01-02 LAB — CBC
HCT: 35.1 % — ABNORMAL LOW (ref 36.0–46.0)
Hemoglobin: 11.2 g/dL — ABNORMAL LOW (ref 12.0–15.0)
MCH: 29.3 pg (ref 26.0–34.0)
MCHC: 31.9 g/dL (ref 30.0–36.0)
MCV: 91.9 fL (ref 78.0–100.0)
Platelets: 262 10*3/uL (ref 150–400)
RBC: 3.82 MIL/uL — ABNORMAL LOW (ref 3.87–5.11)
RDW: 13.6 % (ref 11.5–15.5)
WBC: 14.5 10*3/uL — ABNORMAL HIGH (ref 4.0–10.5)

## 2016-01-02 LAB — BASIC METABOLIC PANEL
Anion gap: 9 (ref 5–15)
BUN: 11 mg/dL (ref 6–20)
CO2: 26 mmol/L (ref 22–32)
Calcium: 8.4 mg/dL — ABNORMAL LOW (ref 8.9–10.3)
Chloride: 102 mmol/L (ref 101–111)
Creatinine, Ser: 0.71 mg/dL (ref 0.44–1.00)
GFR calc Af Amer: >60 mL/min (ref >60)
GFR calc non Af Amer: >60 mL/min (ref >60)
Glucose, Bld: 131 mg/dL — ABNORMAL HIGH (ref 65–99)
Potassium: 4.7 mmol/L (ref 3.5–5.1)
Sodium: 137 mmol/L (ref 135–145)

## 2016-01-02 LAB — GLUCOSE, CAPILLARY
Glucose-Capillary: 164 mg/dL — ABNORMAL HIGH (ref 65–99)
Glucose-Capillary: 205 mg/dL — ABNORMAL HIGH (ref 65–99)
Glucose-Capillary: 162 mg/dL — ABNORMAL HIGH (ref 65–99)
Glucose-Capillary: 161 mg/dL — ABNORMAL HIGH (ref 65–99)

## 2016-01-02 MED ORDER — OXYCODONE-ACETAMINOPHEN 5-325 MG PO TABS
1.0000 | ORAL_TABLET | ORAL | Status: DC | PRN
  Administered 2016-01-02 – 2016-01-03 (×3): 2 via ORAL
  Administered 2016-01-04: 1 via ORAL
  Administered 2016-01-04 (×3): 2 via ORAL
  Filled 2016-01-02 (×7): qty 2

## 2016-01-02 MED ORDER — OXYCODONE HCL ER 20 MG PO T12A
20.0000 mg | EXTENDED_RELEASE_TABLET | Freq: Two times a day (BID) | ORAL | Status: DC
  Administered 2016-01-02 – 2016-01-04 (×5): 20 mg via ORAL
  Filled 2016-01-02 (×5): qty 1

## 2016-01-02 MED ORDER — SODIUM CHLORIDE 0.9 % IV BOLUS (SEPSIS)
500.0000 mL | Freq: Once | INTRAVENOUS | Status: AC
  Administered 2016-01-02: 500 mL via INTRAVENOUS

## 2016-01-02 MED ORDER — SODIUM CHLORIDE 0.9 % IV SOLN
INTRAVENOUS | Status: DC
  Administered 2016-01-02 – 2016-01-03 (×2): via INTRAVENOUS
  Filled 2016-01-02 (×3): qty 1000

## 2016-01-02 MED ORDER — LORAZEPAM 0.5 MG PO TABS
0.5000 mg | ORAL_TABLET | Freq: Four times a day (QID) | ORAL | Status: DC | PRN
  Administered 2016-01-02 – 2016-01-03 (×3): 1 mg via ORAL
  Administered 2016-01-04 (×2): 0.5 mg via ORAL
  Filled 2016-01-02: qty 2
  Filled 2016-01-02: qty 1
  Filled 2016-01-02 (×2): qty 2
  Filled 2016-01-02: qty 1

## 2016-01-02 MED ORDER — LORAZEPAM 2 MG/ML IJ SOLN
0.5000 mg | Freq: Once | INTRAMUSCULAR | Status: AC
  Administered 2016-01-02: 0.5 mg via INTRAVENOUS
  Filled 2016-01-02: qty 1

## 2016-01-02 MED ORDER — OXYCODONE HCL 5 MG PO TABS
5.0000 mg | ORAL_TABLET | ORAL | Status: DC | PRN
  Administered 2016-01-03 – 2016-01-04 (×4): 10 mg via ORAL
  Filled 2016-01-02 (×4): qty 2

## 2016-01-02 NOTE — Evaluation (Signed)
Occupational Therapy Evaluation Patient Details Name: Bonnie Hinton MRN: [id number] DOB: Mar 21, 1941 Today's Date: 01/02/2016    History of Present Illness pt presents with R TKR.  pt with hx of Bil THRs, L TKR, Anxiety, Chronic Back Pain, Glaucoma, HTN, Neuropathy, DM, and SDH.   Clinical Impression   Pt is s/p TKA resulting in deficits listed below (See OT Problem list).  Pt very limited participation secondary to anxiety and pain.  Pt currently requires mod-total assist with ADLs secondary to pain and limited ROM.  Pt will benefit from skilled OT to increase her independence and safety with ADLs and functional mobility to allow discharge to venue listed below.     Follow Up Recommendations  Home health OT;Supervision/Assistance - 24 hour    Equipment Recommendations  None recommended by OT (pt reports having all equip)    Recommendations for Other Services       Precautions / Restrictions Precautions Precautions: Fall Required Braces or Orthoses: Knee Immobilizer - Right Knee Immobilizer - Right: Discontinue once straight leg raise with < 10 degree lag Restrictions Weight Bearing Restrictions: (P) Yes RLE Weight Bearing: (P) Weight bearing as tolerated      Mobility Bed Mobility Overal bed mobility: Needs Assistance Bed Mobility: Sit to Supine       Sit to supine: Mod assist;HOB elevated   General bed mobility comments: pt needs A with R LE to bring into bed  Transfers Overall transfer level: Needs assistance Equipment used: Rolling walker (2 wheeled) Transfers: Sit to/from UGI CorporationStand;Stand Pivot Transfers Sit to Stand: Mod assist Stand pivot transfers: Mod assist       General transfer comment: cues for UE use and positioning of LEs.  pt needs A for power up to standing and very direct cues for decreasing anxiety and attending to task.           ADL Overall ADL's : Needs assistance/impaired         Upper Body Bathing: Set up   Lower Body  Bathing: Moderate assistance   Upper Body Dressing : Set up   Lower Body Dressing: Total assistance Lower Body Dressing Details (indicate cue type and reason): Total assist to don shoes Toilet Transfer: Stand-pivot;BSC;RW;Maximal assistance Toilet Transfer Details (indicate cue type and reason): simulated with stand step from recliner to bed.  Pt required lifting assist to stand from recliner and max cues and increased time with 3-4 steps to pivot to bed.  Pt very fearful and reports increased pain with weight shift         Functional mobility during ADLs: Moderate assistance;Rolling walker General ADL Comments: Mod-max assist to lift into standing from lower surfaces, once upright only min assist with mobility.  However very fearful requiring max cues and encouragement               Pertinent Vitals/Pain Pain Assessment: 0-10 Pain Score: 10-Worst pain ever Pain Descriptors / Indicators: Cramping;Grimacing;Guarding;Sore Pain Intervention(s): Monitored during session;Repositioned;Premedicated before session     Hand Dominance     Extremity/Trunk Assessment Upper Extremity Assessment Upper Extremity Assessment: Overall WFL for tasks assessed           Communication Communication Communication: No difficulties   Cognition Arousal/Alertness: Awake/alert Behavior During Therapy: WFL for tasks assessed/performed Overall Cognitive Status: Within Functional Limits for tasks assessed  Home Living Family/patient expects to be discharged to:: Private residence Living Arrangements: Alone Available Help at Discharge: Family;Available 24 hours/day (daughter (who is a PTA) is available to assist as needed) Type of Home: House Home Access: Level entry     Home Layout: One level     Bathroom Shower/Tub: Producer, television/film/video: Handicapped height     Home Equipment: Environmental consultant - 2 wheels;Walker - 4 wheels;Bedside  commode;Shower seat;Grab bars - toilet;Grab bars - tub/shower          Prior Functioning/Environment Level of Independence: Independent                 OT Problem List: Decreased strength;Decreased range of motion;Decreased activity tolerance;Pain   OT Treatment/Interventions: Self-care/ADL training;DME and/or AE instruction;Therapeutic activities;Patient/family education    OT Goals(Current goals can be found in the care plan section) Acute Rehab OT Goals Patient Stated Goal: to go home OT Goal Formulation: With patient Time For Goal Achievement: 01/16/16 Potential to Achieve Goals: Good  OT Frequency: Min 2X/week   Barriers to D/C:               End of Session Equipment Utilized During Treatment: Gait belt;Rolling walker;Right knee immobilizer CPM Right Knee CPM Right Knee: Off Nurse Communication: Mobility status  Activity Tolerance: Patient limited by fatigue;Patient limited by pain Patient left: in bed;with call bell/phone within reach   Time: 4098-1191 OT Time Calculation (min): 45 min Charges:  OT General Charges $OT Visit: 1 Procedure OT Evaluation $OT Eval Moderate Complexity: 1 Procedure OT Treatments $Self Care/Home Management : 23-37 mins G-CodesRosalio Loud, [phone] 01/02/2016, 2:58 PM

## 2016-01-02 NOTE — Progress Notes (Signed)
Subjective: 1 Day Post-Op Procedure(s) (LRB): TOTAL KNEE ARTHROPLASTY (Right) Patient reports pain as 9 on 0-10 scale and severe.   Patient reports that plain oxycodone and oxycontin do not help her pain.  She has requested percocet 10/325  2 tablets q 4 hrs for pain control.    Objective: Vital signs in last 24 hours: Temp:  [97.2 F (36.2 C)-99 F (37.2 C)] 98.4 F (36.9 C) (10/10 0500) Pulse Rate:  [83-92] 84 (10/10 0500) Resp:  [12-18] 16 (10/10 0500) BP: (126-180)/(62-87) 148/71 (10/10 0500) SpO2:  [92 %-98 %] 98 % (10/10 0500)  Intake/Output from previous day: 10/09 0701 - 10/10 0700 In: 2380 [P.O.:280; I.V.:1550; IV Piggyback:550] Out: 2020 [Urine:1920; Blood:100] Intake/Output this shift: No intake/output data recorded.   Recent Labs  01/01/16 1446 01/02/16 0545  HGB 13.7 11.2*    Recent Labs  01/01/16 1446 01/02/16 0545  WBC 16.1* 14.5*  RBC 4.59 3.82*  HCT 42.7 35.1*  PLT 279 262    Recent Labs  01/01/16 1446 01/02/16 0545  NA  --  137  K  --  4.7  CL  --  102  CO2  --  26  BUN  --  11  CREATININE 0.77 0.71  GLUCOSE  --  131*  CALCIUM  --  8.4*   No results for input(s): LABPT, INR in the last 72 hours.  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  I have given her ativan for her shakes and anxiety because she requested something for the shakes.  She ambulated with me to room 18 and turned around and ambulated back to her door with much verbal moaning but no knee buckling.  She stopped many times to rest frequently in unsafe positions on the walker or grabbing the wall rail.  Multiple times safety cues were given for ambulation or rest she complained constantly.   Therapy will work with her again today and I will see her tomorrow.  Bonnie Hinton J 01/02/2016, 9:42 AM

## 2016-01-02 NOTE — Progress Notes (Signed)
Physical Therapy Treatment Patient Details Name: Bonnie Hinton A Banos MRN: [id number] DOB: January 04, 1941 Today's Date: 01/02/2016    History of Present Illness pt presents with R TKR.  pt with hx of Bil THRs, L TKR, Anxiety, Chronic Back Pain, Glaucoma, HTN, Neuropathy, DM, and SDH.    PT Comments    Pt needs much encouragement for mobility and requires A for all aspects of mobility.  Pt states she is planning on D/C to home, however if does not demonstrate ability to improve mobility, then will need to consider SNF for further therapies.  Will continue to follow.    Follow Up Recommendations  Home health PT;Supervision/Assistance - 24 hour     Equipment Recommendations  3in1 (PT)    Recommendations for Other Services OT consult     Precautions / Restrictions Precautions Precautions: Fall Required Braces or Orthoses: Knee Immobilizer - Right Knee Immobilizer - Right: Discontinue once straight leg raise with < 10 degree lag Restrictions Weight Bearing Restrictions: Yes RLE Weight Bearing: Weight bearing as tolerated    Mobility  Bed Mobility Overal bed mobility: Needs Assistance Bed Mobility: Supine to Sit     Supine to sit: Mod assist;HOB elevated     General bed mobility comments: pt needs A with R LE and bringing trunk up to sitting.    Transfers Overall transfer level: Needs assistance Equipment used: Rolling walker (2 wheeled) Transfers: Sit to/from [employer]Stand;Stand Pivot Transfers Sit to Stand: Mod assist Stand pivot transfers: Mod assist       General transfer comment: cues for UE use and positioning of LEs.  pt needs A for power up to standing and very direct cues for decreasing anxiety and attending to task.    Ambulation/Gait                 Stairs            Wheelchair Mobility    Modified Rankin (Stroke Patients Only)       Balance Overall balance assessment: Needs assistance Sitting-balance support: Bilateral upper extremity  supported;Feet supported Sitting balance-Leahy Scale: Poor     Standing balance support: Bilateral upper extremity supported;During functional activity Standing balance-Leahy Scale: Poor                      Cognition Arousal/Alertness: Awake/alert Behavior During Therapy: WFL for tasks assessed/performed Overall Cognitive Status: Within Functional Limits for tasks assessed                      Exercises      General Comments        Pertinent Vitals/Pain Pain Assessment: 0-10 Pain Score: 9  Pain Location: R knee Pain Descriptors / Indicators: Cramping;Grimacing;Guarding;Sore Pain Intervention(s): Monitored during session;Repositioned;Premedicated before session    Home Living                      Prior Function            PT Goals (current goals can now be found in the care plan section) Acute Rehab PT Goals Patient Stated Goal: back to pool walking PT Goal Formulation: With patient Time For Goal Achievement: 01/08/16 Potential to Achieve Goals: Good Progress towards PT goals: Progressing toward goals    Frequency    7X/week      PT Plan Current plan remains appropriate    Co-evaluation             End of Session Equipment  Utilized During Treatment: Gait belt;Right knee immobilizer Activity Tolerance: Patient limited by fatigue;Patient limited by pain Patient left: in chair;with call bell/phone within reach     Time: 0803-0843 PT Time Calculation (min) (ACUTE ONLY): 40 min  Charges:  $Therapeutic Activity: 38-52 mins                    G CodesSunny Schlein, [place] [phone] 01/02/2016, 10:24 AM

## 2016-01-02 NOTE — Progress Notes (Signed)
Quick Note: Pt has been awake most of shift. Up in recliner chair x 2 with max assistance. Ambulated in hall this am with PA. Medicated as ordered for pain with relief obtained. Previously early this am pt. Was yelling out, crying out in pain while ambulating. Medicated as ordered and pain has been more tolerable for pt while participating in therapies. Ace wraps to both lower legs as pt declines SCD's or TEDS. Meds taken without difficulty. Daughter has been in to visit and involved in care.

## 2016-01-02 NOTE — Progress Notes (Addendum)
Physical Therapy Treatment Patient Details Name: Bonnie Hinton MRN: [id number] DOB: [birth year] Today's Date: 01/02/2016    History of Present Illness pt presents with R TKR.  pt with hx of Bil THRs, L TKR, Anxiety, Chronic Back Pain, Glaucoma, HTN, Neuropathy, DM, and SDH.    PT Comments    Patient tolerated therex and reviewed HEP handout. Mod A required for stand pivot to [facility]. Max encouragement to attempt ambulation this session but limited by paint and anxiety. Continue to progress as tolerated.   Follow Up Recommendations  Home health PT;Supervision/Assistance - 24 hour     Equipment Recommendations  3in1 (PT)    Recommendations for Other Services OT consult     Precautions / Restrictions Precautions Precautions: Fall Required Braces or Orthoses: Knee Immobilizer - Right Knee Immobilizer - Right:  (d/c'd--SLR with <10 degrees) Restrictions Weight Bearing Restrictions: Yes RLE Weight Bearing: Weight bearing as tolerated    Mobility  Bed Mobility Overal bed mobility: Needs Assistance Bed Mobility: Supine to Sit     Supine to sit: Min guard;HOB elevated Sit to supine: Mod assist   General bed mobility comments: assist to elevate bilat LE into bed; use of rails  Transfers Overall transfer level: Needs assistance Equipment used: Rolling walker (2 wheeled) Transfers: Sit to/from Stand Sit to Stand: Mod assist Stand pivot transfers: Mod assist       General transfer comment: max cues for hand placement to stand and on RW, increased WB on R LE, and posture; pt with difficulty taking steps and has tendency to lean on RW with forearms; pt maintain flexed trunk and began to sit prematurely X2 on EOB and BSC and required guidance of hips for safe descent  Ambulation/Gait                 Stairs            Wheelchair Mobility    Modified Rankin (Stroke Patients Only)       Balance     Sitting balance-Leahy Scale: Fair       Standing  balance-Leahy Scale: Poor                      Cognition Arousal/Alertness: Awake/alert Behavior During Therapy: WFL for tasks assessed/performed Overall Cognitive Status: Within Functional Limits for tasks assessed                      Exercises Total Joint Exercises Quad Sets: AROM;10 reps;Supine;Right Heel Slides: AROM;AAROM;Right;10 reps;Supine Hip ABduction/ADduction: AROM;Right;10 reps;Supine Straight Leg Raises: AROM;Right;10 reps;Supine Long Arc Quad: AROM;Right;10 reps;Supine Goniometric ROM: 90 flexion    General Comments        Pertinent Vitals/Pain Pain Assessment: Faces Pain Score: 10-Worst pain ever Faces Pain Scale: Hurts even more Pain Location: R knee and thigh Pain Descriptors / Indicators: Aching;Sore;Grimacing;Guarding Pain Intervention(s): Limited activity within patient's tolerance;Monitored during session;Premedicated before session;Repositioned;Patient requesting pain meds-RN notified    Home Living Family/patient expects to be discharged to:: Private residence Living Arrangements: Alone Available Help at Discharge: Family;Available 24 hours/day (daughter (who is a PTA) is available to assist as needed) Type of Home: House Home Access: Level entry   Home Layout: One level Home Equipment: Walker - 2 wheels;Walker - 4 wheels;Bedside commode;Shower seat;Grab bars - toilet;Grab bars - tub/shower      Prior Function Level of Independence: Independent          PT Goals (current goals can now be found in the  care plan section) Acute Rehab PT Goals Patient Stated Goal: back to pool walking PT Goal Formulation: With patient Time For Goal Achievement: 01/08/16 Potential to Achieve Goals: Good Progress towards PT goals: Not progressing toward goals - comment    Frequency    7X/week      PT Plan Current plan remains appropriate    Co-evaluation             End of Session Equipment Utilized During Treatment: Gait  belt Activity Tolerance: Patient limited by fatigue;Patient limited by pain Patient left: with call bell/phone within reach;in bed;Other (comment) (zero degree foam)     Time: [id number] PT Time Calculation (min) (ACUTE ONLY): 43 min  Charges:  $Therapeutic Exercise: 8-22 mins $Therapeutic Activity: 23-37 mins                    G Codes:      Derek MoundKellyn R Sakia Schrimpf [name], PTA Pager: [phone]   01/02/2016, 4:27 PM

## 2016-01-03 LAB — BASIC METABOLIC PANEL
Anion gap: 10 (ref 5–15)
BUN: 15 mg/dL (ref 6–20)
CO2: 26 mmol/L (ref 22–32)
Calcium: 8.8 mg/dL — ABNORMAL LOW (ref 8.9–10.3)
Chloride: 103 mmol/L (ref 101–111)
Creatinine, Ser: 0.85 mg/dL (ref 0.44–1.00)
GFR calc Af Amer: >60 mL/min (ref >60)
GFR calc non Af Amer: >60 mL/min (ref >60)
Glucose, Bld: 190 mg/dL — ABNORMAL HIGH (ref 65–99)
Potassium: 4.2 mmol/L (ref 3.5–5.1)
Sodium: 139 mmol/L (ref 135–145)

## 2016-01-03 LAB — CBC
HCT: 32.4 % — ABNORMAL LOW (ref 36.0–46.0)
Hemoglobin: 10.5 g/dL — ABNORMAL LOW (ref 12.0–15.0)
MCH: 29.2 pg (ref 26.0–34.0)
MCHC: 32.4 g/dL (ref 30.0–36.0)
MCV: 90.3 fL (ref 78.0–100.0)
Platelets: 216 10*3/uL (ref 150–400)
RBC: 3.59 MIL/uL — ABNORMAL LOW (ref 3.87–5.11)
RDW: 13.8 % (ref 11.5–15.5)
WBC: 13.1 10*3/uL — ABNORMAL HIGH (ref 4.0–10.5)

## 2016-01-03 LAB — GLUCOSE, CAPILLARY
Glucose-Capillary: 158 mg/dL — ABNORMAL HIGH (ref 65–99)
Glucose-Capillary: 189 mg/dL — ABNORMAL HIGH (ref 65–99)
Glucose-Capillary: 166 mg/dL — ABNORMAL HIGH (ref 65–99)
Glucose-Capillary: 157 mg/dL — ABNORMAL HIGH (ref 65–99)

## 2016-01-03 MED ORDER — DEXAMETHASONE SODIUM PHOSPHATE 10 MG/ML IJ SOLN
10.0000 mg | Freq: Once | INTRAMUSCULAR | Status: AC
  Administered 2016-01-03: 10 mg via INTRAVENOUS
  Filled 2016-01-03: qty 1

## 2016-01-03 MED ORDER — DEXAMETHASONE 4 MG PO TABS
4.0000 mg | ORAL_TABLET | Freq: Four times a day (QID) | ORAL | Status: DC
  Administered 2016-01-03 – 2016-01-04 (×6): 4 mg via ORAL
  Filled 2016-01-03 (×6): qty 1

## 2016-01-03 NOTE — Progress Notes (Signed)
Physical Therapy Treatment Patient Details Name: Bonnie Hinton MRN: [id number] DOB: Sep 13, 1940 Today's Date: 01/03/2016    History of Present Illness pt presents with R TKR.  pt with hx of Bil THRs, L TKR, Anxiety, Chronic Back Pain, Glaucoma, HTN, Neuropathy, DM, and SDH.    PT Comments    Pt performed increased mobility from previous session.  Pt reports she is tired from ambulation with ortho PA this am.  PT with significant elbow flexion in standing with use of RW.  PTA lowered RW height to improve UE use.  Will f/u in pm to progress mobility.    Follow Up Recommendations  Home health PT;Supervision/Assistance - 24 hour     Equipment Recommendations  3in1 (PT)    Recommendations for Other Services OT consult     Precautions / Restrictions Precautions Precautions: Fall Precaution Booklet Issued: Yes (comment) Restrictions Weight Bearing Restrictions: Yes RLE Weight Bearing: Weight bearing as tolerated    Mobility  Bed Mobility               General bed mobility comments: Pt received sitting in recliner on arrival.    Transfers Overall transfer level: Needs assistance Equipment used: Rolling walker (2 wheeled) Transfers: Sit to/from Stand Sit to Stand: Mod assist;+2 physical assistance         General transfer comment: Pt performed three transfers without her brace with Mod assist +1.  Pt performed additonal transfer with knee immobilizer donned and required mod assist +2.    Ambulation/Gait Ambulation/Gait assistance: Mod assist Ambulation Distance (Feet): 8 Feet Assistive device: Rolling walker (2 wheeled) Gait Pattern/deviations: Step-to pattern;Decreased step length - left;Decreased stance time - right;Trunk flexed;Antalgic   Gait velocity interpretation: Below normal speed for age/gender General Gait Details: Cues for sequencing and upper trunk control.  RW height lowered to improve use of UEs to off set weight on painful RLE.  Pt required max  VCs for safety and encouragement to advance gait distance.     Stairs            Wheelchair Mobility    Modified Rankin (Stroke Patients Only)       Balance     Sitting balance-Leahy Scale: Poor       Standing balance-Leahy Scale: Poor                      Cognition Arousal/Alertness: Awake/alert Behavior During Therapy: WFL for tasks assessed/performed Overall Cognitive Status: Within Functional Limits for tasks assessed                      Exercises      General Comments        Pertinent Vitals/Pain Pain Assessment: 0-10 Pain Score: 10-Worst pain ever Faces Pain Scale: Hurts even more Pain Location: R knee and thigh Pain Descriptors / Indicators: Aching;Sore;Grimacing;Guarding Pain Intervention(s): Monitored during session;Repositioned;Ice applied    Home Living                      Prior Function            PT Goals (current goals can now be found in the care plan section) Acute Rehab PT Goals Patient Stated Goal: to sit back down in the chair because she is tired.   Potential to Achieve Goals: Good Progress towards PT goals: Progressing toward goals    Frequency    7X/week      PT Plan Current plan remains  appropriate    Co-evaluation             End of Session Equipment Utilized During Treatment: Gait belt Activity Tolerance: Patient limited by fatigue;Patient limited by pain Patient left: with call bell/phone within reach;in bed;Other (comment)     Time: 1050-1120 PT Time Calculation (min) (ACUTE ONLY): 30 min  Charges:  $Gait Training: 8-22 mins $Therapeutic Activity: 8-22 mins                    G Codes:      Florestine Avers 01-24-16, 11:31 AM  Joycelyn Rua, PTA pager [phone]

## 2016-01-03 NOTE — Progress Notes (Signed)
Subjective: 2 Days Post-Op Procedure(s) (LRB): TOTAL KNEE ARTHROPLASTY (Right) Patient reports pain as 9 on 0-10 scale and severe.    Objective: Vital signs in last 24 hours: Temp:  [98.2 F (36.8 C)-98.5 F (36.9 C)] 98.2 F (36.8 C) (10/11 0507) Pulse Rate:  [69-85] 69 (10/11 0507) Resp:  [18] 18 (10/11 0507) BP: (155-177)/(65-82) 155/73 (10/11 0507) SpO2:  [90 %-97 %] 97 % (10/11 0507)  Intake/Output from previous day: 10/10 0701 - 10/11 0700 In: 2098.3 [P.O.:720; I.V.:1378.3] Out: 401 [Urine:401] Intake/Output this shift: No intake/output data recorded.   Recent Labs  01/01/16 1446 01/02/16 0545 01/03/16 0307  HGB 13.7 11.2* 10.5*    Recent Labs  01/02/16 0545 01/03/16 0307  WBC 14.5* 13.1*  RBC 3.82* 3.59*  HCT 35.1* 32.4*  PLT 262 216    Recent Labs  01/02/16 0545 01/03/16 0307  NA 137 139  K 4.7 4.2  CL 102 103  CO2 26 26  BUN 11 15  CREATININE 0.71 0.85  GLUCOSE 131* 190*  CALCIUM 8.4* 8.8*   No results for input(s): LABPT, INR in the last 72 hours.  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  Once again lots of moaning and groaning throughout the session.  She did much better getting out of bed.  She was completely independent in this task.   Initially she refused to put enough weight on her right leg to pick up her left leg.  After much encouragement, and specific instructions, right, left, standup.  She ambulated almost to the black line in the hall to the right of her room.  Just before the black line.  She refused to walk of stand any more and sat back on me.  I held her with the weight belt and was assisted by the nurse, the tech and the nursing student.  Her body went limp and she was completely awake and responsive stating she could not go any further and refused to stand up straight using the walker.  The staff lifted her off of me and held her until we could slide the recliner that was directly behind us under her.  She will need skilled nursing placement  Shalisha Clausing J 01/03/2016, 9:20 AM

## 2016-01-03 NOTE — Care Management (Signed)
Patient was preoperatively setup with [facility]. At this time patient requires SNF for shortterm rehab. Social worker has been notified. Patient's daughter has requested [facility].

## 2016-01-03 NOTE — Clinical Social Work Note (Signed)
Clinical Social Work Assessment  Patient Details  Name: Bonnie Hinton MRN: [id number] Date of Birth: September 14, 1940  Date of referral:                  Reason for consult:  Discharge Planning                Permission sought to share information with:  Facility Sport and exercise psychologist, Family Supports Permission granted to share information::  Yes, Verbal Permission Granted  Name::     [facility]  Agency::  SNFs  Relationship::  Daughter  Contact Information:     Housing/Transportation Living arrangements for the past 2 months:  [place] of Information:  Patient Patient Interpreter Needed:  None Criminal Activity/Legal Involvement Pertinent to Current Situation/Hospitalization:  No - Comment as needed Significant Relationships:  Adult Children Lives with:  Adult Children Do you feel safe going back to the place where you live?  Yes Need for family participation in patient care:  No (Coment)  Care giving concerns:  The patient is agreeable for short term rehab at discharge. Patient would like to rebuild her strength to return home.    Social Worker assessment / plan: CSW met with patient at beside to complete assessment. Patient was resting comfortably in bed. CSW explained PT recommendation for SNF placement. CSW explained SNF search and placement process to the patient and answered her questions. CSW will follow up with bed offers.  Employment status:  Retired Nurse, adult PT Recommendations:  [place] / Referral to community resources:  [place]  Patient/Family's Response to care:The patient appears happy with the care she is receiving in hospital and is appreciative of CSW assistance.  Patient/Family's Understanding of and Emotional Response to Diagnosis, Current Treatment, and Prognosis:  The patient has a good understanding of why she was admitted. She understands the  care plan and what she will need post discharge. Emotional Assessment Appearance:  Appears stated age Attitude/Demeanor/Rapport:   (Patient was appropriate.) Affect (typically observed):  Accepting, Calm, Appropriate Orientation:  Oriented to  Time, Oriented to Place, Oriented to Self, Oriented to Situation Alcohol / Substance use:  Not Applicable Psych involvement (Current and /or in the community):  No (Comment)  Discharge Needs  Concerns to be addressed:  Discharge Planning Concerns Readmission within the last 30 days:  No Current discharge risk:  Physical Impairment Barriers to Discharge:  Continued Medical Work up   [employer], LCSW 01/03/2016, 3:51 PM

## 2016-01-03 NOTE — Progress Notes (Signed)
Pt has been moaning and groaning all morning, pain meds given along with ativan 1mg . And two bags of ice.

## 2016-01-03 NOTE — Clinical Social Work Placement (Signed)
   CLINICAL SOCIAL WORK PLACEMENT  NOTE  Date:  01/03/2016  Patient Details  Name: Bonnie Hinton MRN: [id number] Date of Birth: 01-11-1941  Clinical Social Work is seeking post-discharge placement for this patient at the Skilled  Nursing Facility level of care (*CSW will initial, date and re-position this form in  chart as items are completed):  Yes   Patient/family provided with [facility] Clinical Social Work Department's list of facilities offering this level of care within the geographic area requested by the patient (or if unable, by the patient's family).  Yes   Patient/family informed of their freedom to choose among providers that offer the needed level of care, that participate in Medicare, Medicaid or managed care program needed by the patient, have an available bed and are willing to accept the patient.  Yes   Patient/family informed of [facility]'s ownership interest in [facility] and [facility], as well as of the fact that they are under no obligation to receive care at these facilities.  PASRR submitted to EDS on 01/03/16     PASRR number received on 01/03/16 ([phone] [id number])     Existing PASRR number confirmed on       FL2 transmitted to all facilities in geographic area requested by pt/family on       FL2 transmitted to all facilities within larger geographic area on       Patient informed that his/her managed care company has contracts with or will negotiate with certain facilities, including the following:            Patient/family informed of bed offers received.  Patient chooses bed at       Physician recommends and patient chooses bed at      Patient to be transferred to   on  .  Patient to be transferred to facility by       Patient family notified on   of transfer.  Name of family member notified:        PHYSICIAN Please sign FL2     Additional Comment:    _______________________________________________ Reggy EyeLaShonda A Kyleen Villatoro,  LCSW 01/03/2016, 4:11 PM

## 2016-01-03 NOTE — [place] FL2 (Signed)
[place] MEDICAID FL2 LEVEL OF CARE SCREENING TOOL     IDENTIFICATION  Patient Name: Bonnie Hinton Birthdate: 1940/11/23 Sex: female Admission Date (Current Location): 01/01/2016  [facility] and [place] Number:  Producer, television/film/video and Address:  [place], [address] [place], [place] [address]      Provider Number: [id number]  Attending Physician Name and Address:  Salvatore Marvel, MD  Relative Name and Phone Number:       Current Level of Care: Hospital Recommended Level of Care: Skilled Nursing Facility Prior Approval Number:    Date Approved/Denied:   PASRR Number:    Discharge Plan: SNF    Current Diagnoses: Patient Active Problem List   Diagnosis Date Noted  . Primary localized osteoarthritis of right knee   . Arthritis   . Diabetes mellitus without complication (HCC)   . Hypertension   . Thyroid disease   . Chronic kidney disease   . Subdural hematoma (HCC)   . Status post bilateral hip replacements   . Status post left knee replacement   . Chronic low back pain with right-sided sciatica   . Essential hypertension, benign 07/25/2015  . Preoperative clearance 07/25/2015  . Hyperlipidemia 07/25/2015  . Urinary urgency 04/26/2015  . Xerosis of skin 04/26/2015  . Hyperkalemia 04/26/2015  . 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 Morbid obesity (HCC) 01/24/2015  . Vitamin D deficiency 01/24/2015  . Arthritis of knee, degenerative 09/23/2014    Orientation RESPIRATION BLADDER Height & Weight     Self, Time, Situation, Place  O2 (3L/min) Continent Weight: (!) 301 lb (136.5 kg) Height:  5\' 7"  (170.2 cm)  BEHAVIORAL SYMPTOMS/MOOD NEUROLOGICAL BOWEL NUTRITION STATUS   (none)  (None) Continent Diet (Carb Modified)  AMBULATORY STATUS COMMUNICATION OF NEEDS Skin   Limited Assist Verbally Surgical wounds (Incision Closed: Right Knee)                       Personal Care Assistance Level of Assistance   Bathing, Dressing, Feeding Bathing Assistance: Limited assistance Feeding assistance: Independent Dressing Assistance: Limited assistance     Functional Limitations Info  Sight, Speech, Hearing Sight Info: Adequate Hearing Info: Adequate Speech Info: Adequate    SPECIAL CARE FACTORS FREQUENCY  PT (By licensed PT), OT (By licensed OT)     PT Frequency: 5/ week OT Frequency: 5/ week            Contractures Contractures Info: Not present    Additional Factors Info  Code Status, Allergies, Insulin Sliding Scale Code Status Info: Full Allergies Info: Erythromycin, Iodinated Diagnostic Agents, Fentanyl, Latex, Lisinopril   Insulin Sliding Scale Info: insulin aspart (novoLOG) injection 0-20 Units Dose: 0-20 Units Freq: 3 times daily with meals Route: [place]       Current Medications (01/03/2016):  This is the current hospital active medication list Current Facility-Administered Medications  Medication Dose Route Frequency Provider Last Rate Last Dose  . 0.9 % NaCl with KCl 20 mEq/ L  infusion   Intravenous Continuous Kirstin Shepperson, PA-C      . acetaminophen (TYLENOL) tablet 650 mg  650 mg Oral Q6H PRN Kirstin Shepperson, PA-C       Or  . acetaminophen (TYLENOL) suppository 650 mg  650 mg Rectal Q6H PRN Kirstin Shepperson, PA-C      . alum & mag hydroxide-simeth (MAALOX/MYLANTA) 200-200-20 MG/5ML suspension 30 mL  30 mL Oral Q4H PRN Kirstin Shepperson, PA-C      .  amLODipine (NORVASC) tablet 5 mg  5 mg Oral Daily Kirstin Shepperson, PA-C   5 mg at 01/03/16 1022  . celecoxib (CELEBREX) capsule 200 mg  200 mg Oral Q12H Kirstin Shepperson, PA-C   200 mg at 01/03/16 1023  . cycloSPORINE (RESTASIS) 0.05 % ophthalmic emulsion 1 drop  1 drop Both Eyes BID Kirstin Shepperson, PA-C   1 drop at 01/03/16 1023  . dexamethasone (DECADRON) tablet 4 mg  4 mg Oral Q6H Kirstin Shepperson, PA-C   4 mg at 01/03/16 1210  . diphenhydrAMINE (BENADRYL) 12.5 MG/5ML elixir 12.5-25 mg  12.5-25 mg Oral  Q4H PRN Kirstin Shepperson, PA-C      . docusate sodium (COLACE) capsule 100 mg  100 mg Oral BID Kirstin Shepperson, PA-C   100 mg at 01/03/16 1022  . DULoxetine (CYMBALTA) DR capsule 30 mg  30 mg Oral BID Kirstin Shepperson, PA-C   30 mg at 01/03/16 1023  . enoxaparin (LOVENOX) injection 30 mg  30 mg Subcutaneous Q12H Kirstin Shepperson, PA-C   30 mg at 01/03/16 1610  . ezetimibe (ZETIA) tablet 10 mg  10 mg Oral Daily Kirstin Shepperson, PA-C   10 mg at 01/03/16 1022  . fesoterodine (TOVIAZ) tablet 4 mg  4 mg Oral Daily Kirstin Shepperson, PA-C   4 mg at 01/03/16 1021  . fluticasone (FLONASE) 50 MCG/ACT nasal spray 2 spray  2 spray Each Nare Daily Kirstin Shepperson, PA-C   2 spray at 01/03/16 1027  . gabapentin (NEURONTIN) capsule 300 mg  300 mg Oral BID Kirstin Shepperson, PA-C   300 mg at 01/03/16 1021  . HYDROmorphone (DILAUDID) injection 1 mg  1 mg Intravenous Q2H PRN Kirstin Shepperson, PA-C   1 mg at 01/03/16 0551  . insulin aspart (novoLOG) injection 0-20 Units  0-20 Units Subcutaneous TID WC Kirstin Shepperson, PA-C   4 Units at 01/03/16 1209  . insulin aspart (novoLOG) injection 0-5 Units  0-5 Units Subcutaneous QHS Kirstin Shepperson, PA-C   2 Units at 01/01/16 2236  . insulin aspart (novoLOG) injection 6 Units  6 Units Subcutaneous TID WC Kirstin Shepperson, PA-C   6 Units at 01/03/16 1209  . levothyroxine (SYNTHROID, LEVOTHROID) tablet 200 mcg  200 mcg Oral QAC breakfast Kirstin Shepperson, PA-C   200 mcg at 01/03/16 9604  . LORazepam (ATIVAN) tablet 0.5-1 mg  0.5-1 mg Oral Q6H PRN Kirstin Shepperson, PA-C   1 mg at 01/03/16 1237  . menthol-cetylpyridinium (CEPACOL) lozenge 3 mg  1 lozenge Oral PRN Kirstin Shepperson, PA-C       Or  . phenol (CHLORASEPTIC) mouth spray 1 spray  1 spray Mouth/Throat PRN Kirstin Shepperson, PA-C      . metoCLOPramide (REGLAN) tablet 5-10 mg  5-10 mg Oral Q8H PRN Kirstin Shepperson, PA-C       Or  . metoCLOPramide (REGLAN) injection 5-10 mg  5-10 mg  Intravenous Q8H PRN Kirstin Shepperson, PA-C      . ondansetron (ZOFRAN) tablet 4 mg  4 mg Oral Q6H PRN Kirstin Shepperson, PA-C       Or  . ondansetron (ZOFRAN) injection 4 mg  4 mg Intravenous Q6H PRN Kirstin Shepperson, PA-C      . oxyCODONE-acetaminophen (PERCOCET/ROXICET) 5-325 MG per tablet 1-2 tablet  1-2 tablet Oral Q4H PRN Kirstin Shepperson, PA-C   2 tablet at 01/03/16 0203   And  . oxyCODONE (Oxy IR/ROXICODONE) immediate release tablet 5-10 mg  5-10 mg Oral Q4H PRN Kirstin Shepperson, PA-C   10 mg at 01/03/16 1237  .  oxyCODONE (OXYCONTIN) 12 hr tablet 20 mg  20 mg Oral Q12H Kirstin Shepperson, PA-C   20 mg at 01/03/16 1022  . pantoprazole (PROTONIX) EC tablet 40 mg  40 mg Oral Daily Kirstin Shepperson, PA-C   40 mg at 01/03/16 1022  . polyethylene glycol (MIRALAX / GLYCOLAX) packet 17 g  17 g Oral BID Kirstin Shepperson, PA-C   17 g at 01/02/16 1028  . rosuvastatin (CRESTOR) tablet 20 mg  20 mg Oral Daily Kirstin Shepperson, PA-C   20 mg at 01/03/16 1022  . sodium chloride 0.9 % 1,000 mL infusion   Intravenous Continuous Kirstin Shepperson, PA-C 100 mL/hr at 01/03/16 0204       Discharge Medications: Please see discharge summary for a list of discharge medications.  Relevant Imaging Results:  Relevant Lab Results:   Additional Information SSN:[id number]  Reggy EyeLaShonda A Mikael Debell, LCSW

## 2016-01-03 NOTE — Progress Notes (Signed)
Responded to consult. Provided prayer and spiritual/emotional support. Have requested visit to patient by [employer] communion minister, who will visit on F. Patient or nurse can at any time request this service from the Office of Spiritual Care. Chaplain available for follow-up.   01/03/16 1600  Clinical Encounter Type  Visited With Patient;Health care provider  Visit Type Initial;Spiritual support  Referral From Nurse  Spiritual Encounters  Spiritual Needs Prayer;Ritual;Emotional  Stress Factors  Patient Stress Factors Health changes;Loss of control  Family Stress Factors None identified

## 2016-01-03 NOTE — Progress Notes (Signed)
Physical Therapy Treatment Patient Details Name: Bonnie Hinton MRN: [id number] DOB: Sep 28, 1940 Today's Date: 01/03/2016    History of Present Illness pt presents with R TKR.  pt with hx of Bil THRs, L TKR, Anxiety, Chronic Back Pain, Glaucoma, HTN, Neuropathy, DM, and SDH.    PT Comments    Pt performed increased activity, but required increased assistance as patient begins to fatigue.    Follow Up Recommendations  Home health PT;Supervision/Assistance - 24 hour     Equipment Recommendations  3in1 (PT)    Recommendations for Other Services OT consult     Precautions / Restrictions Precautions Precautions: Fall Precaution Booklet Issued: Yes (comment) Restrictions Weight Bearing Restrictions: Yes RLE Weight Bearing: Weight bearing as tolerated    Mobility  Bed Mobility Overal bed mobility: Needs Assistance         Sit to supine: Mod assist;+2 for physical assistance   General bed mobility comments: Pt required assist to transfer OOB.    Transfers Overall transfer level: Needs assistance Equipment used: Rolling walker (2 wheeled) Transfers: Sit to/from Stand Sit to Stand: Mod assist;+2 physical assistance (assist level varries from mod +1 to mod +2 as patient begins to fatigue.  )         General transfer comment: pt performed x3 transfers, from recliner x2 and BSC x1.  Pt required cues for sequencing, hand placement and foot placement.  Increased time to perform sit to stand transfer.    Ambulation/Gait Ambulation/Gait assistance: Mod assist;+2 safety/equipment (Initially mod assist +1 as gait progressed required mod assist +2.  Pt begins to buckle trunk over front of RW and requires increased assist to maintain stance and improve upper trunk.   ) Ambulation Distance (Feet): 10 Feet Assistive device: Rolling walker (2 wheeled) Gait Pattern/deviations: Step-to pattern;Decreased stride length;Antalgic;Trunk flexed;Shuffle   Gait velocity interpretation:  Below normal speed for age/gender General Gait Details: Cues for sequencing and upper trunk control.  Pt presents with ER of L hip and difficult foot clearance.  Pt limited with ability to progress mobility.     Stairs            Wheelchair Mobility    Modified Rankin (Stroke Patients Only)       Balance Overall balance assessment: Needs assistance   Sitting balance-Leahy Scale: Poor       Standing balance-Leahy Scale: Poor                      Cognition Arousal/Alertness: Awake/alert Behavior During Therapy: WFL for tasks assessed/performed;Anxious Overall Cognitive Status: Within Functional Limits for tasks assessed                      Exercises Total Joint Exercises Ankle Circles/Pumps: AROM;10 reps;Supine;Both Quad Sets: AROM;10 reps;Supine;Right Short Arc Quad: AROM;Right;10 reps;Supine Heel Slides: AROM;Right;10 reps;Supine Hip ABduction/ADduction: AROM;Right;10 reps;Supine Straight Leg Raises: Right;10 reps;AAROM;Supine Goniometric ROM: 58 degrees flexion.      General Comments        Pertinent Vitals/Pain Pain Assessment: 0-10 Pain Score: 5  Faces Pain Scale: Hurts even more Pain Location: R knee Pain Descriptors / Indicators: Aching;Sore Pain Intervention(s): Monitored during session;Repositioned    Home Living                      Prior Function            PT Goals (current goals can now be found in the care plan section) Acute Rehab PT  Goals Patient Stated Goal: to sit back down in the chair because she is tired.   Potential to Achieve Goals: Good Progress towards PT goals: Progressing toward goals    Frequency    7X/week      PT Plan Current plan remains appropriate    Co-evaluation             End of Session Equipment Utilized During Treatment: Gait belt Activity Tolerance: Patient limited by fatigue;Patient limited by pain Patient left: with call bell/phone within reach;in bed;Other  (comment)     Time: 1610-9604 PT Time Calculation (min) (ACUTE ONLY): 46 min  Charges:  $Gait Training: 8-22 mins $Therapeutic Exercise: 8-22 mins $Therapeutic Activity: 8-22 mins                    G Codes:      Florestine Avers Jan 29, 2016, 3:09 PM Joycelyn Rua, PTA pager [phone]

## 2016-01-04 LAB — BASIC METABOLIC PANEL
Anion gap: 8 (ref 5–15)
BUN: 18 mg/dL (ref 6–20)
CO2: 27 mmol/L (ref 22–32)
Calcium: 8.5 mg/dL — ABNORMAL LOW (ref 8.9–10.3)
Chloride: 105 mmol/L (ref 101–111)
Creatinine, Ser: 0.9 mg/dL (ref 0.44–1.00)
GFR calc Af Amer: >60 mL/min (ref >60)
GFR calc non Af Amer: >60 mL/min (ref >60)
Glucose, Bld: 183 mg/dL — ABNORMAL HIGH (ref 65–99)
Potassium: 4.1 mmol/L (ref 3.5–5.1)
Sodium: 140 mmol/L (ref 135–145)

## 2016-01-04 LAB — CBC
HCT: 27.5 % — ABNORMAL LOW (ref 36.0–46.0)
Hemoglobin: 8.7 g/dL — ABNORMAL LOW (ref 12.0–15.0)
MCH: 28.8 pg (ref 26.0–34.0)
MCHC: 31.6 g/dL (ref 30.0–36.0)
MCV: 91.1 fL (ref 78.0–100.0)
Platelets: 212 10*3/uL (ref 150–400)
RBC: 3.02 MIL/uL — ABNORMAL LOW (ref 3.87–5.11)
RDW: 14.1 % (ref 11.5–15.5)
WBC: 12.1 10*3/uL — ABNORMAL HIGH (ref 4.0–10.5)

## 2016-01-04 LAB — GLUCOSE, CAPILLARY
Glucose-Capillary: 149 mg/dL — ABNORMAL HIGH (ref 65–99)
Glucose-Capillary: 177 mg/dL — ABNORMAL HIGH (ref 65–99)
Glucose-Capillary: 186 mg/dL — ABNORMAL HIGH (ref 65–99)

## 2016-01-04 MED ORDER — ACETAMINOPHEN 325 MG PO TABS: 650.0000 mg | ORAL_TABLET | Freq: Four times a day (QID) | ORAL | Status: AC | PRN

## 2016-01-04 MED ORDER — OXYCODONE-ACETAMINOPHEN 10-325 MG PO TABS: ORAL_TABLET | ORAL | 0 refills | Status: DC

## 2016-01-04 MED ORDER — LORAZEPAM 0.5 MG PO TABS: 0.5000 mg | ORAL_TABLET | Freq: Four times a day (QID) | ORAL | 0 refills | Status: DC | PRN

## 2016-01-04 MED ORDER — ENOXAPARIN SODIUM 30 MG/0.3ML ~~LOC~~ SOLN: 30.0000 mg | Freq: Two times a day (BID) | SUBCUTANEOUS | 0 refills | Status: DC

## 2016-01-04 MED ORDER — GABAPENTIN 300 MG PO CAPS: 300.0000 mg | ORAL_CAPSULE | Freq: Two times a day (BID) | ORAL | 0 refills | Status: DC

## 2016-01-04 MED ORDER — DEXAMETHASONE 4 MG PO TABS: 4.0000 mg | ORAL_TABLET | Freq: Two times a day (BID) | ORAL | 0 refills | Status: DC

## 2016-01-04 MED ORDER — DOCUSATE SODIUM 100 MG PO CAPS: ORAL_CAPSULE | ORAL | 0 refills | Status: DC

## 2016-01-04 MED ORDER — OXYCODONE HCL ER 20 MG PO T12A: 20.0000 mg | EXTENDED_RELEASE_TABLET | Freq: Two times a day (BID) | ORAL | 0 refills | Status: DC

## 2016-01-04 MED ORDER — POLYETHYLENE GLYCOL 3350 17 G PO PACK: PACK | ORAL | 0 refills | Status: DC

## 2016-01-04 NOTE — Progress Notes (Signed)
Orthopedic Tech Progress Note Patient Details:  Bonnie Hinton [birth year] [id number]  Patient ID: Bonnie Hinton, female   DOB: [birth year], 75 y.o.   MRN: [id number]  applied cpm 0-77  Trinna PostMartinez, Bonnie Al J 01/04/2016, 6:40 AM

## 2016-01-04 NOTE — Care Management Important Message (Signed)
Important Message  Patient Details  Name: Bonnie Hinton: [id number] Date of Birth: [birth year]   Medicare Important Message Given:  Yes    Elliot CousinShavis, Nolyn Swab Ellen, RN 01/04/2016, 11:33 AM

## 2016-01-04 NOTE — Progress Notes (Signed)
Followed up today and learned that patient will have gone to rehab by tomorrow, so will no longer be here by next opportunity to receive visit from [employer] commun[employer] minister tomorrow. Visited briefly with patient and daughter together, providing spiritual and emotional support. Patient much calmer and in better spirits about going to rehab than yesterday. Chaplain is available for follow-up.   01/04/16 1300  Clinical Encounter Type  Visited With Patient and family together  Visit Type Follow-up;Psychological support;Spiritual support;Social support  Referral From Chaplain  Spiritual Encounters  Spiritual Needs Ritual;Emotional  Stress Factors  Patient Stress Factors Health changes;Loss of control  Family Stress Factors Family relationships

## 2016-01-04 NOTE — Progress Notes (Addendum)
Physical Therapy Treatment Patient Details Name: Bonnie Hinton A Pile MRN: [id number] DOB: Jan 28, 1941 Today's Date: 01/04/2016    History of Present Illness pt presents with R TKR.  pt with hx of Bil THRs, L TKR, Anxiety, Chronic Back Pain, Glaucoma, HTN, Neuropathy, DM, and SDH.    PT Comments    Pt progressing slowly but able to advance gait distance.  Pt is unsafe at this time to go home and will require short term stay at a skilled nursing facility.  Pt and daughter in agreement and SW is already working to place patient.  Will inform supervising PT for change need with d/c disposition.      Follow Up Recommendations  SNF;Supervision/Assistance - 24 hour     Equipment Recommendations  3in1 (PT)    Recommendations for Other Services       Precautions / Restrictions Precautions Precautions: Fall Restrictions Weight Bearing Restrictions: No RLE Weight Bearing: Weight bearing as tolerated    Mobility  Bed Mobility               General bed mobility comments: Pt received in recliner on arrival.    Transfers Overall transfer level: Needs assistance Equipment used: Rolling walker (2 wheeled) Transfers: Sit to/from Stand Sit to Stand: Mod assist;+2 physical assistance         General transfer comment: Pt required cues for sequencing, foot placement, hand placement, forward weight shifting and extension of hips/knees/trunk and head in standing.  Pt required increased time.    Ambulation/Gait Ambulation/Gait assistance: Mod assist;+2 physical assistance Ambulation Distance (Feet): 14 Feet Assistive device: Rolling walker (2 wheeled) Gait Pattern/deviations: Step-to pattern;Trunk flexed;Antalgic;Decreased stride length   Gait velocity interpretation: Below normal speed for age/gender General Gait Details: Cues for sequencing and upper trunk control.  Pt presents with ER of L hip and difficult foot clearance.  Pt limited with ability to progress mobility.  OT to  physically progress LLE.     Stairs            Wheelchair Mobility    Modified Rankin (Stroke Patients Only)       Balance Overall balance assessment: Needs assistance   Sitting balance-Leahy Scale: Fair       Standing balance-Leahy Scale: Poor                      Cognition Arousal/Alertness: Awake/alert Behavior During Therapy: WFL for tasks assessed/performed;Anxious Overall Cognitive Status: Within Functional Limits for tasks assessed                      Exercises      General Comments        Pertinent Vitals/Pain Pain Assessment: 0-10 Pain Score: 4  Pain Location: R knee Pain Descriptors / Indicators: Aching;Sore Pain Intervention(s): Monitored during session;Repositioned    Home Living                      Prior Function            PT Goals (current goals can now be found in the care plan section) Acute Rehab PT Goals Patient Stated Goal: TO take a shower and wash her hair.   Potential to Achieve Goals: Fair Progress towards PT goals: Progressing toward goals    Frequency    7X/week      PT Plan Discharge plan needs to be updated    Co-evaluation PT/OT/SLP Co-Evaluation/Treatment: Yes Reason for Co-Treatment: For patient/therapist safety;Complexity of  the patient's impairments (multi-system involvement) PT goals addressed during session: Mobility/safety with mobility;Proper use of DME;Balance OT goals addressed during session: ADL's and self-care     End of Session Equipment Utilized During Treatment: Gait belt Activity Tolerance: Patient limited by fatigue;Patient limited by pain Patient left: with call bell/phone within reach;in bed;Other (comment)     Time: 1308-6578 PT Time Calculation (min) (ACUTE ONLY): 57 min  Charges:  $Gait Training: 8-22 mins $Therapeutic Activity: 8-22 mins                    G Codes:      Florestine Avers 2016-01-31, 3:29 PM  Joycelyn Rua, PTA pager [phone]

## 2016-01-04 NOTE — Clinical Social Work Placement (Signed)
   CLINICAL SOCIAL WORK PLACEMENT  NOTE  Date:  01/04/2016  Patient Details  Name: Bonnie Hinton MRN: [id number] Date of Birth: Jun 28, 1940  Clinical Social Work is seeking post-discharge placement for this patient at the Skilled  Nursing Facility level of care (*CSW will initial, date and re-position this form in  chart as items are completed):  Yes   Patient/family provided with [facility] Clinical Social Work Department's list of facilities offering this level of care within the geographic area requested by the patient (or if unable, by the patient's family).  Yes   Patient/family informed of their freedom to choose among providers that offer the needed level of care, that participate in Medicare, Medicaid or managed care program needed by the patient, have an available bed and are willing to accept the patient.  Yes   Patient/family informed of [facility]'s ownership interest in [facility] and [facility], as well as of the fact that they are under no obligation to receive care at these facilities.  PASRR submitted to EDS on 01/03/16     PASRR number received on 01/03/16 ([id number])     Existing PASRR number confirmed on       FL2 transmitted to all facilities in geographic area requested by pt/family on       FL2 transmitted to all facilities within larger geographic area on       Patient informed that his/her managed care company has contracts with or will negotiate with certain facilities, including the following:        Yes   Patient/family informed of bed offers received.  [facility] bed [facility]     Physician recommends and patient chooses bed at      Patient to be [facility] on 01/04/16.  Patient to be transferred to facility by [facility]     Patient family notified on 01/04/16 of transfer.  Name of family member notified:        PHYSICIAN Please prepare prescriptions     Additional Comment:     _______________________________________________ Margarito LinerSarah C Treyshawn Muldrew, LCSW 01/04/2016, 4:10 PM

## 2016-01-04 NOTE — Discharge Summary (Signed)
Patient ID: Bonnie Hinton MRN: [id number] DOB/AGE: 1940-05-13 75 y.o.  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 Anxiety   . Arthritis   . Chronic back pain    stenosis and arthritis  . Complication of anesthesia    anxious when she wakes up  . GERD (gastroesophageal reflux disease)    takes Pantoprazole daily  . Glaucoma    dry angle  . History of bronchitis    30+ yrs ago  . History of MRSA infection 2004   several yrs ago  . Hyperlipidemia    takes Zetia and Crestor daily  . Hypertension    take Amlodipine daily  . Hypothyroidism    takes Synthroid daily  . Joint pain   . Joint swelling   . Neuropathy (HCC)    takes Gabapentin daily  . Peripheral edema    takes Furosemide daily as needed  . Pneumonia    hx of-30 + yrs ago  . Pre-diabetes    takes Victoza daily  . Primary localized osteoarthritis of right knee   . Subdural hematoma (HCC) 8 yrs ago   hx of  . Urinary frequency   . Urinary urgency     Surgeries: Procedure(s): TOTAL KNEE ARTHROPLASTY on 01/01/2016   Consultants:   Discharged Condition: Improved  Hospital Course: Bonnie Hinton is an 75 y.o. female who was admitted 01/01/2016 for operative treatment ofPrimary localized osteoarthritis of right knee. Patient has severe unremitting pain that affects sleep, daily activities, and work/hobbies. After pre-op clearance the patient was taken to the operating room on  01/01/2016 and underwent  Procedure(s): TOTAL KNEE ARTHROPLASTY.    Patient was given perioperative antibiotics: Anti-infectives    Start     Dose/Rate Route Frequency Ordered Stop   01/01/16 2230  vancomycin (VANCOCIN) IVPB 1000 mg/200 mL premix     1,000 mg 200 mL/hr over 60 Minutes Intravenous Every 12 hours 01/01/16 1423 01/01/16 2337   01/01/16 1430  ceFAZolin (ANCEF) IVPB 2g/100 mL premix  Status:  Discontinued     2 g 200 mL/hr over 30 Minutes Intravenous Every 6 hours 01/01/16 1423 01/01/16 1438   01/01/16 1045  vancomycin (VANCOCIN) 1,500 mg in sodium chloride 0.9 % 500 mL IVPB     1,500 mg 250 mL/hr over 120 Minutes Intravenous To Surgery 01/01/16 1036 01/01/16 1240   01/01/16 0600  ceFAZolin (ANCEF) 3 g in dextrose 5 % 50 mL IVPB     3 g 130 mL/hr over 30 Minutes Intravenous On call to O.R. 12/31/15 1507 01/01/16 1035       Patient was given sequential compression devices, early ambulation, and chemoprophylaxis to prevent DVT.  Patient benefited maximally from hospital stay and there were no complications.    Recent vital signs: Patient Vitals for the past 24 hrs:  BP Temp Temp src Pulse Resp SpO2  01/04/16 0530 (!) 120/45 98.2 F (36.8 C) Oral 88 18 98 %  01/03/16 2134 (!) 150/73 98.7 F (37.1 C) Oral 90 17 97 %  01/03/16 1500 (!) 164/67 98.6 F (37 C) - 94 18 94 %  01/03/16 1021 (!) 155/73 - - - - -     Recent laboratory studies:  Recent Labs  01/03/16 0307 01/04/16 0431  WBC 13.1* 12.1*  HGB 10.5* 8.7*  HCT 32.4* 27.5*  PLT 216 212  NA 139 140  K 4.2 4.1  CL 103 105  CO2 26 27  BUN 15 18  CREATININE 0.85 0.90  GLUCOSE 190* 183*  CALCIUM 8.8* 8.5*     Discharge Medications:     Medication List    STOP taking these medications   aspirin EC 81 MG tablet   b complex vitamins capsule   celecoxib 200 MG capsule Commonly known as:  CELEBREX   diclofenac sodium 1 % Gel Commonly known as:  VOLTAREN   estradiol 1 MG tablet Commonly known as:   ESTRACE   Omega-3 1000 MG Caps   traMADol 50 MG tablet Commonly known as:  ULTRAM     TAKE these medications   acetaminophen 325 MG tablet Commonly known as:  TYLENOL Take 2 tablets (650 mg total) by mouth every 6 (six) hours as needed for mild pain (or Fever >/= 101).   amLODipine 5 MG tablet Commonly known as:  NORVASC Take 1 tablet (5 mg total) by mouth daily.   cycloSPORINE 0.05 % ophthalmic emulsion Commonly known as:  RESTASIS 1 drop Two (2) times a day. What changed:  how much to take  how to take this  when to take this  additional instructions   dexamethasone 4 MG tablet Commonly known as:  DECADRON Take 1 tablet (4 mg total) by mouth 2 (two) times daily.   docusate sodium 100 MG capsule Commonly known as:  COLACE 1 tab 2 times a day while on narcotics.  STOOL SOFTENER   DULoxetine 30 MG capsule Commonly known as:  CYMBALTA Take 30 mg by mouth 2 (two) times daily.   enoxaparin 30 MG/0.3ML injection Commonly known as:  LOVENOX Inject 0.3 mLs (30 mg total) into the skin every 12 (twelve) hours.   ergocalciferol 50000 units capsule Commonly known as:  DRISDOL Take 1 capsule (50,000 Units total) by mouth once a week.   ezetimibe 10 MG tablet Commonly known as:  ZETIA Take 1 tablet (10 mg total) by mouth daily.   fluticasone 50 MCG/ACT nasal spray Commonly known as:  FLONASE 1 spray by Each Nare route every evening as needed for congestion   furosemide 20 MG tablet Commonly known as:  LASIX Take 1 tablet (20 mg total) by mouth daily as needed for fluid or edema.   gabapentin 300 MG capsule Commonly known as:  NEURONTIN Take 1 capsule (300 mg total) by mouth 2 (two) times daily.   glucose blood test strip Use to test blood sugar once daily .  E11.22   levothyroxine 200 MCG tablet Commonly known as:  SYNTHROID, LEVOTHROID Take 1 tablet (200 mcg total) by mouth daily before breakfast.   liraglutide 18 MG/3ML Sopn Inject 0.6 mg into the skin  daily.   LORazepam 0.5 MG tablet Commonly known as:  ATIVAN Take 1-2 tablets (0.5-1 mg total) by mouth every 6 (six) hours as needed for anxiety or sleep.   onetouch ultrasoft lancets Use as instructed   oxyCODONE 20 mg 12 hr tablet Commonly known as:  OXYCONTIN Take 1 tablet (20 mg total) by mouth every  12 (twelve) hours.   oxyCODONE-acetaminophen 10-325 MG tablet Commonly known as:  PERCOCET 1-2 tablets every 4-6 hrs What changed:  how much to take  how to take this  when to take this  reasons to take this  additional instructions   pantoprazole 40 MG tablet Commonly known as:  PROTONIX Take 1 tablet (40 mg total) by mouth daily.   polyethylene glycol packet Commonly known as:  MIRALAX / GLYCOLAX 17grams in 6 oz of water twice a day until bowel movement.  LAXITIVE.  Restart if two days since last bowel movement   rosuvastatin 20 MG tablet Commonly known as:  CRESTOR Take 1 tablet (20 mg total) by mouth daily.   tolterodine 4 MG 24 hr capsule Commonly known as:  DETROL LA Take 1 capsule (4 mg total) by mouth 2 (two) times daily.       Diagnostic Studies: No results found.  Disposition: 01-Home or Self Care  Discharge Instructions    CPM    Complete by:  As directed    Continuous passive motion machine (CPM):      Use the CPM from 0 to 90 for 6 hours per day.       You may break it up into 2 or 3 sessions per day.      Use CPM for 2 weeks or until you are told to stop.   Call MD / Call 911    Complete by:  As directed    If you experience chest pain or shortness of breath, CALL 911 and be transported to the hospital emergency room.  If you develope a fever above 101 F, pus (white drainage) or increased drainage or redness at the wound, or calf pain, call your surgeon's office.   Change dressing    Complete by:  As directed    Change the gauze dressing daily with sterile 4 x 4 inch gauze and apply TED hose.  DO NOT REMOVE BANDAGE OVER SURGICAL INCISION.   WASH WHOLE LEG INCLUDING OVER THE WATERPROOF BANDAGE WITH SOAP AND WATER EVERY DAY.   Constipation Prevention    Complete by:  As directed    Drink plenty of fluids.  Prune juice may be helpful.  You may use a stool softener, such as Colace (over the counter) 100 mg twice a day.  Use MiraLax (over the counter) for constipation as needed.   Diet - low sodium heart healthy    Complete by:  As directed    Discharge instructions    Complete by:  As directed    INSTRUCTIONS AFTER JOINT REPLACEMENT   Remove items at home which could result in a fall. This includes throw rugs or furniture in walking pathways ICE to the affected joint every three hours while awake for 30 minutes at a time, for at least the first 3-5 days, and then as needed for pain and swelling.  Continue to use ice for pain and swelling. You may notice swelling that will progress down to the foot and ankle.  This is normal after surgery.  Elevate your leg when you are not up walking on it.   Continue to use the breathing machine you got in the hospital (incentive spirometer) which will help keep your temperature down.  It is common for your temperature to cycle up and down following surgery, especially at night when you are not up moving around and exerting yourself.  The breathing machine keeps your lungs expanded and your temperature down.  DIET:  As you were doing prior to hospitalization, we recommend a well-balanced diet.  DRESSING / WOUND CARE / SHOWERING  Keep the surgical dressing until follow up.  The dressing is water proof, so you can shower without any extra covering.  IF THE DRESSING FALLS OFF or the wound gets wet inside, change the dressing with sterile gauze.  Please use good hand washing techniques before changing the dressing.  Do not use any lotions or creams on the incision until instructed by your surgeon.    ACTIVITY  Increase activity slowly as tolerated, but follow the weight bearing instructions below.    No driving for 6 weeks or until further direction given by your physician.  You cannot drive while taking narcotics.  No lifting or carrying greater than 10 lbs. until further directed by your surgeon. Avoid periods of inactivity such as sitting longer than an hour when not asleep. This helps prevent blood clots.  You may return to work once you are authorized by your doctor.     WEIGHT BEARING   Weight bearing as tolerated with assist device (walker, cane, etc) as directed, use it as long as suggested by your surgeon or therapist, typically at least 2-3 weeks.   EXERCISES  Results after joint replacement surgery are often greatly improved when you follow the exercise, range of motion and muscle strengthening exercises prescribed by your doctor. Safety measures are also important to protect the joint from further injury. Any time any of these exercises cause you to have increased pain or swelling, decrease what you are doing until you are comfortable again and then slowly increase them. If you have problems or questions, call your caregiver or physical therapist for advice.   Rehabilitation is important following a joint replacement. After just a few days of immobilization, the muscles of the leg can become weakened and shrink (atrophy).  These exercises are designed to build up the tone and strength of the thigh and leg muscles and to improve motion. Often times heat used for twenty to thirty minutes before working out will loosen up your tissues and help with improving the range of motion but do not use heat for the first two weeks following surgery (sometimes heat can increase post-operative swelling).   These exercises can be done on a training (exercise) mat, on the floor, on a table or on a bed. Use whatever works the best and is most comfortable for you.    Use music or television while you are exercising so that the exercises are a pleasant break in your day. This will make your life  better with the exercises acting as a break in your routine that you can look forward to.   Perform all exercises about fifteen times, three times per day or as directed.  You should exercise both the operative leg and the other leg as well.   Exercises include:  Quad Sets - Tighten up the muscle on the front of the thigh (Quad) and hold for 5-10 seconds.   Straight Leg Raises - With your knee straight (if you were given a brace, keep it on), lift the leg to 60 degrees, hold for 3 seconds, and slowly lower the leg.  Perform this exercise against resistance later as your leg gets stronger.  Leg Slides: Lying on your back, slowly slide your foot toward your buttocks, bending your knee up off the floor (only go as far as is comfortable). Then slowly slide your foot back down until  your leg is flat on the floor again.  Angel Wings: Lying on your back spread your legs to the side as far apart as you can without causing discomfort.  Hamstring Strength:  Lying on your back, push your heel against the floor with your leg straight by tightening up the muscles of your buttocks.  Repeat, but this time bend your knee to a comfortable angle, and push your heel against the floor.  You may put a pillow under the heel to make it more comfortable if necessary.   A rehabilitation program following joint replacement surgery can speed recovery and prevent re-injury in the future due to weakened muscles. Contact your doctor or a physical therapist for more information on knee rehabilitation.    CONSTIPATION  Constipation is defined medically as fewer than three stools per week and severe constipation as less than one stool per week.  Even if you have a regular bowel pattern at home, your normal regimen is likely to be disrupted due to multiple reasons following surgery.  Combination of anesthesia, postoperative narcotics, change in appetite and fluid intake all can affect your bowels.   YOU MUST use at least one of the  following options; they are listed in order of increasing strength to get the job done.  They are all available over the counter, and you may need to use some, POSSIBLY even all of these options:    Drink plenty of fluids (prune juice may be helpful) and high fiber foods Colace 100 mg by mouth twice a day  Senokot for constipation as directed and as needed Dulcolax (bisacodyl), take with full glass of water  Miralax (polyethylene glycol) once or twice a day as needed.  If you have tried all these things and are unable to have a bowel movement in the first 3-4 days after surgery call either your surgeon or your primary doctor.    If you experience loose stools or diarrhea, hold the medications until you stool forms back up.  If your symptoms do not get better within 1 week or if they get worse, check with your doctor.  If you experience "the worst abdominal pain ever" or develop nausea or vomiting, please contact the office immediately for further recommendations for treatment.   ITCHING:  If you experience itching with your medications, try taking only a single pain pill, or even half a pain pill at a time.  You can also use Benadryl over the counter for itching or also to help with sleep.   TED HOSE STOCKINGS:  Use stockings on both legs until for at least 2 weeks or as directed by physician office. They may be removed at night for sleeping.  MEDICATIONS:  See your medication summary on the "After Visit Summary" that nursing will review with you.  You may have some home medications which will be placed on hold until you complete the course of blood thinner medication.  It is important for you to complete the blood thinner medication as prescribed.  PRECAUTIONS:  If you experience chest pain or shortness of breath - call 911 immediately for transfer to the hospital emergency department.   If you develop a fever greater that 101 F, purulent drainage from wound, increased redness or drainage from  wound, foul odor from the wound/dressing, or calf pain - CONTACT YOUR SURGEON.  FOLLOW-UP APPOINTMENTS:  If you do not already have a post-op appointment, please call the office for an appointment to be seen by your surgeon.  Guidelines for how soon to be seen are listed in your "After Visit Summary", but are typically between 1-4 weeks after surgery.  OTHER INSTRUCTIONS:   Knee Replacement:  Do not place pillow under knee, focus on keeping the knee straight while resting. CPM instructions: 0-90 degrees, 2 hours in the morning, 2 hours in the afternoon, and 2 hours in the evening. Place foam block, curve side up under heel at all times except when in CPM or when walking.  DO NOT modify, tear, cut, or change the foam block in any way.  MAKE SURE YOU:  Understand these instructions.  Get help right away if you are not doing well or get worse.    Thank you for letting us be a part of your medical care team.  It is a privilege we respect greatly.  We hope these instructions will help you stay on track for a fast and full recovery!   Do not put a pillow under the knee. Place it under the heel.    Complete by:  As directed    Place gray foam block, curve side up under heel at all times except when in CPM or when walking.  DO NOT modify, tear, cut, or change in any way the gray foam block.   Increase activity slowly as tolerated    Complete by:  As directed    Patient may shower    Complete by:  As directed    Aquacel dressing is water proof    Wash over it and the whole leg with soap and water at the end of your shower   TED hose    Complete by:  As directed    Use stockings (TED hose) for 2 weeks on both leg(s).  You may remove them at night for sleeping.      Follow-up Information    Nilda Simmer, MD Follow up on 01/16/2016.   Specialty:  Orthopedic Surgery Why:  appt  time 10 am Contact information: [address] [place] [address] [phone]            Signed: Pascal Lux 01/04/2016, 8:36 AM

## 2016-01-04 NOTE — Plan of Care (Signed)
Problem: Pain Managment: Goal: General experience of comfort will improve Outcome: Progressing Medicated for pain with full relief  Problem: Activity: Goal: Range of joint motion will improve Outcome: Progressing Patient is on CPM per her request   Problem: Physical Regulation: Goal: Postoperative complications will be avoided or minimized Outcome: Progressing No post op complications noted

## 2016-01-04 NOTE — Clinical Social Work Note (Addendum)
Clinical[facility]. Should take about 4 hours to get.  Charlynn CourtSarah Naethan Bracewell, CSW [phone]  1:44 am [facility] to check on auth status. Case worker stated that she could not locate patient in the system. She will continue looking and call CSW back.  Charlynn CourtSarah Judine Arciniega, CSW [phone]  1:57 pm [facility] case worker called back and stated that the patient is definitely not [facility]. Christy Sprinkle at [facility] notified and will start insurance authorization.  Charlynn CourtSarah Bee Hammerschmidt, CSW [phone]  2:34 pm Richardine Servicehristy Sprinkle at [facility] is working on Psychiatristgetting insurance authorization. CSW called and spoke to [employer]. Patient does have out-of-network SNF benefits with no deductible and a 100% fee schedule. $125 per stay copay until out-of-pocket maximum of $3350 which she has put $791.41 towards.  Charlynn CourtSarah Ayelet Gruenewald, CSW [phone]  3:30 pm Per Neysa Bonitohristy, she gave the information verbally and the [facility] is working on the [place]. Case worker is aware of plan for discharge today. CSW updated patient.  Charlynn CourtSarah Espen Bethel, CSW [phone]  4:05 pm Insurance authorization obtained. Will ask RN when [facility] can be called.  Charlynn CourtSarah Lileigh Fahringer, CSW [phone]

## 2016-01-04 NOTE — Progress Notes (Signed)
Occupational Therapy Treatment Patient Details Name: Bonnie Hinton MRN: [id number] DOB: 06-14-1940 Today's Date: 01/04/2016    History of present illness pt presents with R TKR.  pt with hx of Bil THRs, L TKR, Anxiety, Chronic Back Pain, Glaucoma, HTN, Neuropathy, DM, and SDH.   OT comments  Pt making progress towards OT goals. Pt ambulated into shower/bathroom and got a shower today and was an active participant. Pt able to perform sink level ADL with encouragement from therapist. Pt needed max encouragement and is a +2 for safety for mobility surrounding ADL. Pt will benefit from continued therapies in a skilled nursing facility prior to d/c home with 24 hour supervision. Discharge recommendation updated.  Follow Up Recommendations  SNF    Equipment Recommendations  None recommended by OT (Pt reports having appropriate equipment)    Recommendations for Other Services      Precautions / Restrictions Precautions Precautions: Fall Restrictions Weight Bearing Restrictions: Yes RLE Weight Bearing: Weight bearing as tolerated       Mobility Bed Mobility               General bed mobility comments: Pt OOB in recliner when OT enetered the room.  Transfers Overall transfer level: Needs assistance Equipment used: Rolling walker (2 wheeled) Transfers: Sit to/from Stand Sit to Stand: Mod assist;+2 physical assistance         General transfer comment: Pt required cues for sequencing, foot placement, hand placement, forward weight shifting and extension of hips/knees/trunk and head in standing.  Pt required increased time.      Balance Overall balance assessment: Needs assistance Sitting-balance support: No upper extremity supported;Feet supported Sitting balance-Leahy Scale: Fair       Standing balance-Leahy Scale: Poor                     ADL Overall ADL's : Needs assistance/impaired     Grooming: Brushing hair;Oral care;Min guard;Standing Grooming  Details (indicate cue type and reason): sink level, bent over with both forearms on the sink Upper Body Bathing: Moderate assistance;Sitting Upper Body Bathing Details (indicate cue type and reason): assist for back Lower Body Bathing: Maximal assistance;Sitting/lateral leans Lower Body Bathing Details (indicate cue type and reason): Pt able to get slightly past knees, then requires total assist. Upper Body Dressing : Set up       Toilet Transfer: +2 for safety/equipment;Ambulation;RW Toilet Transfer Details (indicate cue type and reason): bariatric BSC Toileting- Clothing Manipulation and Hygiene: Min guard;Sitting/lateral lean Toileting - Clothing Manipulation Details (indicate cue type and reason): Pt able to wipe from font for peri care on bariatric BSC     Functional mobility during ADLs: +2 for safety/equipment;Rolling walker General ADL Comments: Mod-max assist to lift into standing from lower surfaces, once upright only min assist with mobility.  However very fearful requiring max cues and encouragement, +2 for safety follow with chair      Vision                     Perception     Praxis      Cognition   Behavior During Therapy: [facility] for tasks assessed/performed;Anxious Overall Cognitive Status: Within Functional Limits for tasks assessed                       Extremity/Trunk Assessment               Exercises     Shoulder Instructions  General Comments      Pertinent Vitals/ Pain       Pain Assessment: 0-10 Pain Score: 4  Pain Location: R knee Pain Descriptors / Indicators: Aching;Sore Pain Intervention(s): Monitored during session;Repositioned  Home Living                                          Prior Functioning/Environment              Frequency  Min 2X/week        Progress Toward Goals  OT Goals(current goals can now be found in the care plan section)  Progress towards OT goals:  Progressing toward goals  Acute Rehab OT Goals Patient Stated Goal: TO take a shower and wash her hair.   OT Goal Formulation: With patient Time For Goal Achievement: 01/16/16 Potential to Achieve Goals: Good  Plan Discharge plan needs to be updated    Co-evaluation    PT/OT/SLP Co-Evaluation/Treatment: Yes Reason for Co-Treatment: For patient/therapist safety;Complexity of the patient's impairments (multi-system involvement) PT goals addressed during session: Mobility/safety with mobility OT goals addressed during session: ADL's and self-care      End of Session Equipment Utilized During Treatment: Gait belt;Rolling walker   Activity Tolerance Patient tolerated treatment well;Patient limited by fatigue   Patient Left in chair;with call bell/phone within reach (with social work)   Nurse Communication Mobility status        Time: [id number] OT Time Calculation (min): 57 min  Charges: OT General Charges $OT Visit: 1 Procedure OT Treatments $Self Care/Home Management : 23-37 mins  Bonnie Hinton 01/04/2016, 4:33 PM Sherryl MangesLaura Makinzee Hinton OTR/L [phone]

## 2016-01-04 NOTE — Progress Notes (Signed)
[facility]

## 2016-01-04 NOTE — Clinical Social Work Note (Signed)
CSW facilitated patient discharge including contacting patient family and facility to confirm patient discharge plans. Clinical information faxed to facility and family agreeable with plan. CSW arranged ambulance transport via [facility] to [facility]. RN to call report prior to discharge ([phone]).  CSW will sign off for now as social work intervention is no longer needed. Please consult us again if new needs arise.  Alis Sawchuk, CSW [phone]   

## 2016-01-05 ENCOUNTER — Non-Acute Institutional Stay (SKILLED_NURSING_FACILITY): Payer: Medicare Other | Admitting: Family

## 2016-01-05 ENCOUNTER — Encounter: Payer: Self-pay | Admitting: Family

## 2016-01-05 ENCOUNTER — Telehealth: Payer: Self-pay

## 2016-01-05 DIAGNOSIS — M25561 Pain in right knee: Secondary | ICD-10-CM | POA: Diagnosis not present

## 2016-01-05 DIAGNOSIS — R030 Elevated blood-pressure reading, without diagnosis of hypertension: Secondary | ICD-10-CM | POA: Diagnosis not present

## 2016-01-05 DIAGNOSIS — Z96651 Presence of right artificial knee joint: Secondary | ICD-10-CM

## 2016-01-05 NOTE — Progress Notes (Signed)
Patient ID: Bonnie Hinton, female   DOB: 08-17-1940, 75 y.o.   MRN: [id number]  Location:  [facility] Room Number: 1008 Place of Service:  SNF (31) Provider: Leanny Moeckel FNP-C   Sherlene ShamsULLO, TERESA L, MD  Patient Care Team: Sherlene Shamseresa L Tullo, MD as PCP - General (Internal Medicine)  Extended Emergency Contact Information Primary Emergency Contact: Renae GlossKopacki,Mary Mahreen Address: [address]          [place], [place] [address] [place] Phone: [phone] Relation: Daughter  Code Status:  Full Code  Goals of care: Advanced Directive information Advanced Directives 01/05/2016  Does patient have an advance directive? No  Type of Advance Directive -  Copy of advanced directive(s) in chart? -  Would patient like information on creating an advanced directive? No - patient declined information     Chief Complaint  Patient presents with  . Acute Visit    right leg pain     HPI:  Pt is a 75 y.o. female seen today at [facility] for an acute visit for evaluation of right knee pain. She is status post Hospital admission 01/01/2016- 01/04/2016 for localized osteoarthritis. She underwent right total knee replacement 01/01/2016. She now here for Rehab. She is seen in her room today per facility Nurse request.She complains of pain on right surgical incision states current pain regimen ineffective.Patient's daughter at bedside.She denies any fever or chills.    Past Medical History:  Diagnosis Date  . Anxiety   . Arthritis   . Chronic back pain    stenosis and arthritis  . Complication of anesthesia    anxious when she wakes up  . GERD (gastroesophageal reflux disease)    takes Pantoprazole daily  . Glaucoma    dry angle  . History of bronchitis    30+ yrs ago  . History of MRSA infection 2004   several yrs ago  . Hyperlipidemia    takes Zetia and Crestor daily  . Hypertension    take Amlodipine daily    . Hypothyroidism    takes Synthroid daily  . Joint pain   . Joint swelling   . Neuropathy (HCC)    takes Gabapentin daily  . Peripheral edema    takes Furosemide daily as needed  . Pneumonia    hx of-30 + yrs ago  . Pre-diabetes    takes Victoza daily  . Primary localized osteoarthritis of right knee   . Subdural hematoma (HCC) 8 yrs ago   hx of  . Urinary frequency   . Urinary urgency    Past Surgical History:  Procedure Laterality Date  . ABDOMINAL HYSTERECTOMY    . COLONOSCOPY    . RADIOLOGY WITH ANESTHESIA N/A 08/22/2015   Procedure: MRI OF LUMBAR SPINE   (RADIOLOGY WITH ANESTHESIA);  Surgeon: Medication Radiologist, MD;  Location: [facility] OR;  Service: Radiology;  Laterality: N/A;  . TOTAL HIP ARTHROPLASTY Right   . TOTAL HIP ARTHROPLASTY Left   . TOTAL KNEE ARTHROPLASTY Left   . TOTAL KNEE ARTHROPLASTY Right 01/01/2016   Procedure: TOTAL KNEE ARTHROPLASTY;  Surgeon: Salvatore Marvelobert Wainer, MD;  [facility];  Service: Orthopedics;  Laterality: Right;    Allergies  Allergen Reactions  . Erythromycin Rash  . Iodinated Diagnostic Agents Swelling and Rash  . Fentanyl Other (See Comments)    Duragesic-25  . Latex Itching and Rash  . Lisinopril Other (See Comments)    Hyperkalemia  Medication List       Accurate as of 01/05/16 12:09 PM. Always use your most recent med list.          acetaminophen 325 MG tablet Commonly known as:  TYLENOL Take 2 tablets (650 mg total) by mouth every 6 (six) hours as needed for mild pain (or Fever >/= 101).   amLODipine 5 MG tablet Commonly known as:  NORVASC Take 1 tablet (5 mg total) by mouth daily.   cycloSPORINE 0.05 % ophthalmic emulsion Commonly known as:  RESTASIS Place 1 drop into both eyes 2 (two) times daily.   dexamethasone 4 MG tablet Commonly known as:  DECADRON Take 1 tablet (4 mg total) by mouth 2 (two) times daily.   docusate sodium 100 MG capsule Commonly known as:  COLACE 1 tab 2 times a day while on  narcotics.  STOOL SOFTENER   DULoxetine 30 MG capsule Commonly known as:  CYMBALTA Take 30 mg by mouth 2 (two) times daily.   enoxaparin 30 MG/0.3ML injection Commonly known as:  LOVENOX Inject 0.3 mLs (30 mg total) into the skin every 12 (twelve) hours.   ergocalciferol 50000 units capsule Commonly known as:  DRISDOL Take 1 capsule (50,000 Units total) by mouth once a week.   ezetimibe 10 MG tablet Commonly known as:  ZETIA Take 1 tablet (10 mg total) by mouth daily.   fluticasone 50 MCG/ACT nasal spray Commonly known as:  FLONASE 1 spray by Each Nare route every evening as needed for congestion   furosemide 20 MG tablet Commonly known as:  LASIX Take 1 tablet (20 mg total) by mouth daily as needed for fluid or edema.   gabapentin 300 MG capsule Commonly known as:  NEURONTIN Take 1 capsule (300 mg total) by mouth 2 (two) times daily.   levothyroxine 200 MCG tablet Commonly known as:  SYNTHROID, LEVOTHROID Take 1 tablet (200 mcg total) by mouth daily before breakfast.   liraglutide 18 MG/3ML Sopn Inject 0.6 mg into the skin daily.   LORazepam 0.5 MG tablet Commonly known as:  ATIVAN Take 1-2 tablets (0.5-1 mg total) by mouth every 6 (six) hours as needed for anxiety or sleep.   oxyCODONE 20 mg 12 hr tablet Commonly known as:  OXYCONTIN Take 1 tablet (20 mg total) by mouth every 12 (twelve) hours.   oxyCODONE-acetaminophen 10-325 MG tablet Commonly known as:  PERCOCET 1-2 tablets every 4-6 hrs   pantoprazole 40 MG tablet Commonly known as:  PROTONIX Take 1 tablet (40 mg total) by mouth daily.   polyethylene glycol packet Commonly known as:  MIRALAX / GLYCOLAX 17grams in 6 oz of water twice a day until bowel movement.  LAXITIVE.  Restart if two days since last bowel movement   rosuvastatin 20 MG tablet Commonly known as:  CRESTOR Take 1 tablet (20 mg total) by mouth daily.   tolterodine 4 MG 24 hr capsule Commonly known as:  DETROL LA Take 4 mg by mouth  daily.       Review of Systems  Constitutional: Negative for activity change, appetite change, chills and fever.  HENT: Negative.   Eyes: Negative.   Respiratory: Negative for cough, chest tightness, shortness of breath and wheezing.   Cardiovascular: Negative for chest pain, palpitations and leg swelling.  Gastrointestinal: Negative for abdominal distention, abdominal pain, constipation, diarrhea, nausea and vomiting.  Genitourinary: Negative for dysuria, flank pain, frequency and urgency.  Musculoskeletal: Positive for gait problem.       Right knee pain  current pain regimen ineffective.   Skin: Negative for color change, pallor and rash.       Right knee surgical incision   Hematological: Does not bruise/bleed easily.  Psychiatric/Behavioral: Negative for agitation, confusion, hallucinations and sleep disturbance. The patient is not nervous/anxious.     Immunization History  Administered Date(s) Administered  . Influenza, High Dose Seasonal PF 01/23/2015, 12/04/2015  . Pneumococcal Conjugate-13 04/25/2015   Pertinent  Health Maintenance Due  Topic Date Due  . FOOT EXAM  01/10/1951  . OPHTHALMOLOGY EXAM  01/10/1951  . MAMMOGRAM  01/10/1991  . COLONOSCOPY  01/10/1991  . DEXA SCAN  01/09/2006  . PNA vac Low Risk Adult (2 of 2 - PPSV23) 04/24/2016  . HEMOGLOBIN A1C  06/02/2016  . URINE MICROALBUMIN  07/31/2016  . INFLUENZA VACCINE  Completed   Fall Risk  04/25/2015  Falls in the past year? No      Vitals:   01/05/16 1206  BP: (!) 170/86  Pulse: 88  Temp: 99.4 F (37.4 C)  TempSrc: Oral  SpO2: 95%  Weight: (!) 301 lb (136.5 kg)  Height: 5\' 7"  (1.702 m)   Body mass index is 47.14 kg/m. Physical Exam  Constitutional: She is oriented to person, place, and time. No distress.  Obese  HENT:  Head: Normocephalic.  Mouth/Throat: Oropharynx is clear and moist. No oropharyngeal exudate.  Eyes: Conjunctivae and EOM are normal. Pupils are equal, round, and reactive to  light. Right eye exhibits no discharge. Left eye exhibits no discharge. No scleral icterus.  Neck: Normal range of motion. No thyromegaly present.  Cardiovascular: Normal rate, regular rhythm, normal heart sounds and intact distal pulses.  Exam reveals no gallop and no friction rub.   No murmur heard. Pulmonary/Chest: Effort normal and breath sounds normal. No respiratory distress. She has no wheezes. She has no rales.  Abdominal: Soft. Bowel sounds are normal. She exhibits no distension. There is no tenderness. There is no rebound and no guarding.  Musculoskeletal: She exhibits no edema, tenderness or deformity.  Right knee limited ROM due to pain.    Lymphadenopathy:    She has no cervical adenopathy.  Neurological: She is oriented to person, place, and time.  Skin: Skin is warm and dry. No rash noted. No erythema. No pallor.  Right knee surgical incision drsg dry, clean and intact. Surrounding skin tissue without any redness, swelling or tenderness.   Psychiatric: She has a normal mood and affect.    Labs reviewed:  Recent Labs  04/18/15 0950  01/02/16 0545 01/03/16 0307 01/04/16 0431  NA 135  < > 137 139 140  K 4.6  < > 4.7 4.2 4.1  CL 99  < > 102 103 105  CO2 26  < > 26 26 27   GLUCOSE 112*  < > 131* 190* 183*  BUN 41*  < > 11 15 18   CREATININE 1.33*  < > 0.71 0.85 0.90  CALCIUM 9.1  < > 8.4* 8.8* 8.5*  PHOS 3.9  --   --   --   --   < > = values in this interval not displayed.  Recent Labs  08/01/15 1450 12/04/15 1710 12/21/15 1019  AST 23 42* 27  ALT 54* 37* 17  ALKPHOS 137* 60 57  BILITOT 0.4 0.4 0.6  PROT 7.2 7.4 7.1  ALBUMIN 4.0 3.9 3.6    Recent Labs  12/21/15 1019  01/02/16 0545 01/03/16 0307 01/04/16 0431  WBC 6.8  < > 14.5* 13.1* 12.1*  NEUTROABS 4.0  --   --   --   --   HGB 14.8  < > 11.2* 10.5* 8.7*  HCT 46.5*  < > 35.1* 32.4* 27.5*  MCV 93.2  < > 91.9 90.3 91.1  PLT 265  < > 262 216 212  < > = values in this interval not displayed. Lab  Results  Component Value Date   TSH 1.53 04/11/2015   Lab Results  Component Value Date   HGBA1C 6.4 12/04/2015   Lab Results  Component Value Date   CHOL 153 12/04/2015   HDL 57.80 12/04/2015   LDLCALC 67 12/04/2015   LDLDIRECT 62.0 12/04/2015   TRIG 141.0 12/04/2015   CHOLHDL 3 12/04/2015   Assessment/Plan Right knee pain  status post right knee replacement 01/01/2016. Reports current pain regimen ineffective.Change Oxycodone-APAP 10/325 mg Tablet to 1-2 tablet every 4 HRS PRN one tablet for moderate pain and two tablets for severe pain. Will add muscle relaxant if no relief.Continue to monitor.   Elevated Blood Pressure  B/p elevated suspect possible due to right knee pain. Adjust pain medication as above. Continue to monitor B/p.   Status post right knee replacement status post Hospital admission 01/01/2016- 01/04/2016 for localized osteoarthritis. She underwent right total knee replacement 01/01/2016.Adjust pain medication as above. Continue PT/OT for ROM, exercise gait stability and muscle strengthening.    Family/ staff Communication: Reviewed plan of care with patient and facility Nurse supervisor.   Labs/tests ordered:  None

## 2016-01-05 NOTE — Telephone Encounter (Signed)
[facility] is at [facility], TCM call not done as NP at facility seeing patient.  Will follow when discharged. thanks

## 2016-01-08 ENCOUNTER — Encounter: Payer: Self-pay | Admitting: Internal Medicine

## 2016-01-08 ENCOUNTER — Non-Acute Institutional Stay (SKILLED_NURSING_FACILITY): Payer: Medicare Other | Admitting: Internal Medicine

## 2016-01-08 DIAGNOSIS — K219 Gastro-esophageal reflux disease without esophagitis: Secondary | ICD-10-CM | POA: Diagnosis not present

## 2016-01-08 DIAGNOSIS — M1711 Unilateral primary osteoarthritis, right knee: Secondary | ICD-10-CM | POA: Diagnosis not present

## 2016-01-08 DIAGNOSIS — J9811 Atelectasis: Secondary | ICD-10-CM

## 2016-01-08 DIAGNOSIS — E079 Disorder of thyroid, unspecified: Secondary | ICD-10-CM

## 2016-01-08 DIAGNOSIS — K5909 Other constipation: Secondary | ICD-10-CM | POA: Diagnosis not present

## 2016-01-08 DIAGNOSIS — F411 Generalized anxiety disorder: Secondary | ICD-10-CM

## 2016-01-08 DIAGNOSIS — R2681 Unsteadiness on feet: Secondary | ICD-10-CM | POA: Diagnosis not present

## 2016-01-08 DIAGNOSIS — E1142 Type 2 diabetes mellitus with diabetic polyneuropathy: Secondary | ICD-10-CM

## 2016-01-08 DIAGNOSIS — R3915 Urgency of urination: Secondary | ICD-10-CM | POA: Diagnosis not present

## 2016-01-08 DIAGNOSIS — D72828 Other elevated white blood cell count: Secondary | ICD-10-CM | POA: Diagnosis not present

## 2016-01-08 DIAGNOSIS — E785 Hyperlipidemia, unspecified: Secondary | ICD-10-CM

## 2016-01-08 DIAGNOSIS — M792 Neuralgia and neuritis, unspecified: Secondary | ICD-10-CM | POA: Diagnosis not present

## 2016-01-08 DIAGNOSIS — I10 Essential (primary) hypertension: Secondary | ICD-10-CM | POA: Diagnosis not present

## 2016-01-08 DIAGNOSIS — D62 Acute posthemorrhagic anemia: Secondary | ICD-10-CM | POA: Diagnosis not present

## 2016-01-08 NOTE — Progress Notes (Signed)
[facility]ATION[facility]  PCP: Sherlene ShamsULLO, TERESA L, MD   Code Status: Full Code  Goals of care: Advanced Directive information Advanced Directives 01/05/2016  Does patient have an advance directive? No  Type of Advance Directive -  Copy of advanced directive(s) in chart? -  Would patient like information on creating an advanced directive? No - patient declined information       Extended Emergency Contact Information Primary Emergency Contact: Renae GlossKopacki,Mary Hailyn Address: [address]          [place], [place] [id number] [place] Mobile Phone: [phone] Relation: Daughter   Allergies  Allergen Reactions  . Erythromycin Rash  . Iodinated Diagnostic Agents Swelling and Rash  . Fentanyl Other (See Comments)    Duragesic-25  . Latex Itching and Rash  . Lisinopril Other (See Comments)    Hyperkalemia     Chief Complaint  Patient presents with  . New Admit To SNF    New Admisson Visit     HPI:  Patient is a 75 y.o. female seen today for short term rehabilitation post hospital admission from 01/01/16-01/04/16 with primary right knee OA. She underwent right total knee arthroplasty. She has PMH of morbid obesity, type 2 DM, ckd, HTN among others. She is seen in her room today with her daughter at bedside.   Review of Systems:  Constitutional: Negative for fever, chills, diaphoresis. Feels weak and tired. HENT: Negative for headache, congestion, nasal discharge, sore throat, difficulty swallowing.   Eyes: Negative for blurred vision, double vision and discharge. Wears glasses. Respiratory: Negative for cough, shortness of breath and wheezing.   Cardiovascular: Negative for chest pain, palpitations, leg swelling.  Gastrointestinal: Negative for heartburn, vomiting, abdominal pain. Positive for nausea. Last bowel movement was yesterday.  Genitourinary: Negative for dysuria and flank pain.  Musculoskeletal: Negative for fall in the facility. pain  medication has been helpful Skin: Negative for itching, rash.  Neurological: Positive for dizziness with change of position. Psychiatric/Behavioral: Negative for depression   Past Medical History:  Diagnosis Date  . Anxiety   . Arthritis   . Chronic back pain    stenosis and arthritis  . Complication of anesthesia    anxious when she wakes up  . GERD (gastroesophageal reflux disease)    takes Pantoprazole daily  . Glaucoma    dry angle  . History of bronchitis    30+ yrs ago  . History of MRSA infection 2004   several yrs ago  . Hyperlipidemia    takes Zetia and Crestor daily  . Hypertension    take Amlodipine daily  . Hypothyroidism    takes Synthroid daily  . Joint pain   . Joint swelling   . Neuropathy (HCC)    takes Gabapentin daily  . Peripheral edema    takes Furosemide daily as needed  . Pneumonia    hx of-30 + yrs ago  . Pre-diabetes    takes Victoza daily  . Primary localized osteoarthritis of right knee   . Subdural hematoma (HCC) 8 yrs ago   hx of  . Urinary frequency   . Urinary urgency    Past Surgical History:  Procedure Laterality Date  . ABDOMINAL HYSTERECTOMY    . COLONOSCOPY    . RADIOLOGY WITH ANESTHESIA N/A 08/22/2015   Procedure: MRI OF LUMBAR SPINE   (RADIOLOGY WITH ANESTHESIA);  Surgeon: Medication Radiologist, MD;  Location: [facility] OR;  Service: Radiology;  Laterality: N/A;  . TOTAL HIP ARTHROPLASTY  Right   . TOTAL HIP ARTHROPLASTY Left   . TOTAL KNEE ARTHROPLASTY Left   . TOTAL KNEE ARTHROPLASTY Right 01/01/2016   Procedure: TOTAL KNEE ARTHROPLASTY;  Surgeon: Salvatore Marvel, MD;  Location: [facility] OR;  Service: Orthopedics;  Laterality: Right;   Social History:   reports that she has never smoked. She has never used smokeless tobacco. She reports that she does not drink alcohol or use drugs.  Family History  Problem Relation Age of Onset  . Cancer Father   . Cancer Brother   . Diabetes Maternal Aunt     Medications:   Medication List        Accurate as of 01/08/16 12:36 PM. Always use your most recent med list.          acetaminophen 325 MG tablet Commonly known as:  TYLENOL Take 2 tablets (650 mg total) by mouth every 6 (six) hours as needed for mild pain (or Fever >/= 101).   amLODipine 5 MG tablet Commonly known as:  NORVASC Take 1 tablet (5 mg total) by mouth daily.   cycloSPORINE 0.05 % ophthalmic emulsion Commonly known as:  RESTASIS Place 1 drop into both eyes 2 (two) times daily.   dexamethasone 4 MG tablet Commonly known as:  DECADRON Take 1 tablet (4 mg total) by mouth 2 (two) times daily.   docusate sodium 100 MG capsule Commonly known as:  COLACE 1 tab 2 times a day while on narcotics.  STOOL SOFTENER   DULoxetine 30 MG capsule Commonly known as:  CYMBALTA Take 30 mg by mouth 2 (two) times daily.   enoxaparin 30 MG/0.3ML injection Commonly known as:  LOVENOX Inject 0.3 mLs (30 mg total) into the skin every 12 (twelve) hours.   ergocalciferol 50000 units capsule Commonly known as:  DRISDOL Take 1 capsule (50,000 Units total) by mouth once a week.   ezetimibe 10 MG tablet Commonly known as:  ZETIA Take 1 tablet (10 mg total) by mouth daily.   fluticasone 50 MCG/ACT nasal spray Commonly known as:  FLONASE 1 spray by Each Nare route every evening as needed for congestion   furosemide 20 MG tablet Commonly known as:  LASIX Take 1 tablet (20 mg total) by mouth daily as needed for fluid or edema.   gabapentin 300 MG capsule Commonly known as:  NEURONTIN Take 1 capsule (300 mg total) by mouth 2 (two) times daily.   levothyroxine 200 MCG tablet Commonly known as:  SYNTHROID, LEVOTHROID Take 1 tablet (200 mcg total) by mouth daily before breakfast.   liraglutide 18 MG/3ML Sopn Inject 0.6 mg into the skin daily.   LORazepam 0.5 MG tablet Commonly known as:  ATIVAN Take 0.5 mg by mouth every 6 (six) hours as needed for anxiety.   oxyCODONE 20 mg 12 hr tablet Commonly known as:   OXYCONTIN Take 1 tablet (20 mg total) by mouth every 12 (twelve) hours.   oxyCODONE-acetaminophen 10-325 MG tablet Commonly known as:  PERCOCET Take 1-2 tablets by mouth every 4 (four) hours as needed for pain.   pantoprazole 40 MG tablet Commonly known as:  PROTONIX Take 1 tablet (40 mg total) by mouth daily.   polyethylene glycol packet Commonly known as:  MIRALAX / GLYCOLAX 17grams in 6 oz of water twice a day until bowel movement.  LAXITIVE.  Restart if two days since last bowel movement   rosuvastatin 20 MG tablet Commonly known as:  CRESTOR Take 1 tablet (20 mg total) by mouth daily.  tolterodine 4 MG 24 hr capsule Commonly known as:  DETROL LA Take 4 mg by mouth daily.       Immunizations: Immunization History  Administered Date(s) Administered  . Influenza, High Dose Seasonal PF 01/23/2015, 12/04/2015  . Pneumococcal Conjugate-13 04/25/2015     Physical Exam: Vitals:   01/08/16 1210  BP: (!) 150/76  Pulse: 90  Resp: 18  Temp: 98.8 F (37.1 C)  TempSrc: Oral  SpO2: 98%  Weight: (!) 301 lb (136.5 kg)  Height: 5\' 7"  (1.702 m)   Body mass index is 47.14 kg/m.  General- elderly female, morbidly obese, in no acute distress Head- normocephalic, atraumatic Nose- no nasal discharge Throat- moist mucus membrane Eyes- PERRLA, EOMI, no pallor, no icterus, no discharge Neck- no cervical lymphadenopathy Cardiovascular- normal s1,s2, no murmur, trace leg edema Respiratory- bilateral decreased air entry to lung bases, no wheeze, no rhonchi, no crackles, no use of accessory muscles Abdomen- bowel sounds present, soft, non tender Musculoskeletal- able to move all 4 extremities, limited right knee ROM Neurological- alert and oriented to person, place and time Skin- warm and dry, right knee surgical incision with aquacel dressing in place, minimal drainage, bruise to right knee Psychiatry- anxious this visit    Labs reviewed: Basic Metabolic Panel:  Recent  Labs  04/18/15 0950  01/02/16 0545 01/03/16 0307 01/04/16 0431  NA 135  < > 137 139 140  K 4.6  < > 4.7 4.2 4.1  CL 99  < > 102 103 105  CO2 26  < > 26 26 27   GLUCOSE 112*  < > 131* 190* 183*  BUN 41*  < > 11 15 18   CREATININE 1.33*  < > 0.71 0.85 0.90  CALCIUM 9.1  < > 8.4* 8.8* 8.5*  PHOS 3.9  --   --   --   --   < > = values in this interval not displayed. Liver Function Tests:  Recent Labs  08/01/15 1450 12/04/15 1710 12/21/15 1019  AST 23 42* 27  ALT 54* 37* 17  ALKPHOS 137* 60 57  BILITOT 0.4 0.4 0.6  PROT 7.2 7.4 7.1  ALBUMIN 4.0 3.9 3.6   No results for input(s): LIPASE, AMYLASE in the last 8760 hours. No results for input(s): AMMONIA in the last 8760 hours. CBC:  Recent Labs  12/21/15 1019  01/02/16 0545 01/03/16 0307 01/04/16 0431  WBC 6.8  < > 14.5* 13.1* 12.1*  NEUTROABS 4.0  --   --   --   --   HGB 14.8  < > 11.2* 10.5* 8.7*  HCT 46.5*  < > 35.1* 32.4* 27.5*  MCV 93.2  < > 91.9 90.3 91.1  PLT 265  < > 262 216 212  < > = values in this interval not displayed. Cardiac Enzymes: No results for input(s): CKTOTAL, CKMB, CKMBINDEX, TROPONINI in the last 8760 hours. BNP: Invalid input(s): POCBNP CBG:  Recent Labs  01/04/16 0559 01/04/16 1120 01/04/16 1648  GLUCAP 149* 177* 186*     Assessment/Plan  Unsteady gait With right knee replacement. Will have patient work with PT/OT as tolerated to regain strength and restore function.  Fall precautions are in place.  Right knee OA S/p right total knee arthroplasty. Continue oxycontin 20 mg bid with tylenol 650 mg q6h prn and oxycodone-apap 10-325 mg 1-2 tab q4h prn pain. Has orthopedic follow up. Continue lovenox for dvt prophylaxis. Will have her work with physical therapy and occupational therapy team to help with gait training  and muscle strengthening exercises.fall precautions. Skin care. Encourage to be out of bed. Pt currently on dexamethasone 4 mg bid for now, will need clarification on the stop  date. WBAT to RLE. CPM machine to be used  Leukocytosis Afebrile, likely reactive, monitor cbc with diff  Blood loss anemia Post op, monitor cbc  Poor air entry Concern for atelectasis given recent surgery. Encouraged to use incentive spirometer. Monitor her breathing  HTN Monitor bp q shift, currently on norvasc 5 mg daily  Constipation Continue miralax 17 g bid and colace bid. Hydration to be maintained  GERD Continue pantoprazole  Neuropathic pain Continue neurontin 300 mg bid  Dm type 2 with neuropathic pain Lab Results  Component Value Date   HGBA1C 6.4 12/04/2015   Continue liraglutide with statin and gabapentin  GAD Continue lorazepam 0.5 mg q6h prn. Get psych consult  Hyperlipidemia Continue home regimen statin and zetia  Hypothyroidism Continue levothyroxine 200 mcg daily Lab Results  Component Value Date   TSH 1.53 04/11/2015   Urinary urgency Stable, continue detrol  Chronic low back pain Continue cymbalta 30 mg bid with oxycontin and prn percocet.    Goals of care: short term rehabilitation   Labs/tests ordered: cbc, cmp   Family/ staff Communication: reviewed care plan with patient, her daughter and nursing supervisor    Oneal Grout, MD Internal Medicine [facility] [address] [place], [place] [address] Cell Phone (Monday-Friday 8 am - 5 pm): [phone] On Call: [phone] and follow prompts after 5 pm and on weekends Office Phone: [phone] Office Fax: [phone]

## 2016-01-10 LAB — BASIC METABOLIC PANEL
BUN: 21 mg/dL (ref 4–21)
Creatinine: 0.8 mg/dL (ref 0.5–1.1)
Glucose: 181 mg/dL
Potassium: 4 mmol/L (ref 3.4–5.3)
Sodium: 141 mmol/L (ref 137–147)

## 2016-01-10 LAB — CBC AND DIFFERENTIAL
HCT: 31 % — AB (ref 36–46)
Hemoglobin: 9.9 g/dL — AB (ref 12.0–16.0)
Platelets: 368 10*3/uL (ref 150–399)
WBC: 15.6 10^3/mL

## 2016-01-10 LAB — HEPATIC FUNCTION PANEL
ALT: 24 U/L (ref 7–35)
AST: 14 U/L (ref 13–35)
Alkaline Phosphatase: 80 U/L (ref 25–125)
Bilirubin, Total: 0.6 mg/dL

## 2016-01-11 ENCOUNTER — Non-Acute Institutional Stay (SKILLED_NURSING_FACILITY): Payer: Medicare Other | Admitting: Family

## 2016-01-11 DIAGNOSIS — D649 Anemia, unspecified: Secondary | ICD-10-CM

## 2016-01-11 DIAGNOSIS — D72829 Elevated white blood cell count, unspecified: Secondary | ICD-10-CM | POA: Insufficient documentation

## 2016-01-11 DIAGNOSIS — E441 Mild protein-calorie malnutrition: Secondary | ICD-10-CM

## 2016-01-11 NOTE — Progress Notes (Signed)
Location:  [facility] Room Number: 1008 P  Place of Service:  SNF (31) Provider:  Dinah Ngetich FNP-C   Sherlene Shams, MD  Patient Care Team: Sherlene Shams, MD as PCP - General (Internal Medicine)  Extended Emergency Contact Information Primary Emergency Contact: Renae Gloss Address: [address]          [place], [place] [address] [place] Mobile Phone: [phone] Relation: Daughter  Code Status:  Full Code  Goals of care: Advanced Directive information Advanced Directives 01/05/2016  Does patient have an advance directive? No  Type of Advance Directive -  Copy of advanced directive(s) in chart? -  Would patient like information on creating an advanced directive? No - patient declined information     Chief Complaint  Patient presents with  . Acute Visit    follow up labs     HPI:  Pt is a 75 y.o. female seen today at [facility] for an acute visit for evaluation of lab results. She is seen in her room today with facility CNA at the bedside. She states progressive with therapy though frustrated due to inability to bear weight on right leg. Patient encouraged to continue work with therapy. She states right knee surgical incision pain under control with current pain regimen. Her recent lab results showed WBC 15.6, Hgb 9.9, HCT 31.2, Alb 3.30, TP 5.8 ( 01/10/2016).She denies any fever, chills or cough.She states her appetite improving post-op but trying to adjust to facility food. Of note she is currently on Dexamethasone tapered dose for DDD.Facility staff reports no new concerns.    Past Medical History:  Diagnosis Date  . Anxiety   . Arthritis   . Chronic back pain    stenosis and arthritis  . Complication of anesthesia    anxious when she wakes up  . GERD (gastroesophageal reflux disease)    takes Pantoprazole daily  . Glaucoma    dry angle  . History of bronchitis    30+ yrs ago    . History of MRSA infection 2004   several yrs ago  . Hyperlipidemia    takes Zetia and Crestor daily  . Hypertension    take Amlodipine daily  . Hypothyroidism    takes Synthroid daily  . Joint pain   . Joint swelling   . Neuropathy (HCC)    takes Gabapentin daily  . Peripheral edema    takes Furosemide daily as needed  . Pneumonia    hx of-30 + yrs ago  . Pre-diabetes    takes Victoza daily  . Primary localized osteoarthritis of right knee   . Subdural hematoma (HCC) 8 yrs ago   hx of  . Urinary frequency   . Urinary urgency    Past Surgical History:  Procedure Laterality Date  . ABDOMINAL HYSTERECTOMY    . COLONOSCOPY    . RADIOLOGY WITH ANESTHESIA N/A 08/22/2015   Procedure: MRI OF LUMBAR SPINE   (RADIOLOGY WITH ANESTHESIA);  Surgeon: Medication Radiologist, MD;  Location: [facility] OR;  Service: Radiology;  Laterality: N/A;  . TOTAL HIP ARTHROPLASTY Right   . TOTAL HIP ARTHROPLASTY Left   . TOTAL KNEE ARTHROPLASTY Left   . TOTAL KNEE ARTHROPLASTY Right 01/01/2016   Procedure: TOTAL KNEE ARTHROPLASTY;  Surgeon: Salvatore Marvel, MD;  Location: [facility] OR;  Service: Orthopedics;  Laterality: Right;    Allergies  Allergen Reactions  . Erythromycin Rash  . Iodinated Diagnostic  Agents Swelling and Rash  . Fentanyl Other (See Comments)    Duragesic-25  . Latex Itching and Rash  . Lisinopril Other (See Comments)    Hyperkalemia       Medication List       Accurate as of 01/11/16  5:38 PM. Always use your most recent med list.          acetaminophen 325 MG tablet Commonly known as:  TYLENOL Take 2 tablets (650 mg total) by mouth every 6 (six) hours as needed for mild pain (or Fever >/= 101).   amLODipine 5 MG tablet Commonly known as:  NORVASC Take 1 tablet (5 mg total) by mouth daily.   cycloSPORINE 0.05 % ophthalmic emulsion Commonly known as:  RESTASIS Place 1 drop into both eyes 2 (two) times daily.   dexamethasone 2 MG tablet Commonly known as:   DECADRON Take 2 mg by mouth 2 (two) times daily. Then start Dexamethasone 1 mg tablet twice daily X 2 weeks   docusate sodium 100 MG capsule Commonly known as:  COLACE 1 tab 2 times a day while on narcotics.  STOOL SOFTENER   DULoxetine 30 MG capsule Commonly known as:  CYMBALTA Take 30 mg by mouth 2 (two) times daily.   enoxaparin 30 MG/0.3ML injection Commonly known as:  LOVENOX Inject 0.3 mLs (30 mg total) into the skin every 12 (twelve) hours.   ergocalciferol 50000 units capsule Commonly known as:  DRISDOL Take 1 capsule (50,000 Units total) by mouth once a week.   ezetimibe 10 MG tablet Commonly known as:  ZETIA Take 1 tablet (10 mg total) by mouth daily.   fluticasone 50 MCG/ACT nasal spray Commonly known as:  FLONASE 1 spray by Each Nare route every evening as needed for congestion   furosemide 20 MG tablet Commonly known as:  LASIX Take 1 tablet (20 mg total) by mouth daily as needed for fluid or edema.   gabapentin 300 MG capsule Commonly known as:  NEURONTIN Take 1 capsule (300 mg total) by mouth 2 (two) times daily.   levothyroxine 200 MCG tablet Commonly known as:  SYNTHROID, LEVOTHROID Take 1 tablet (200 mcg total) by mouth daily before breakfast.   liraglutide 18 MG/3ML Sopn Inject 0.6 mg into the skin daily.   LORazepam 0.5 MG tablet Commonly known as:  ATIVAN Take 0.5 mg by mouth every 6 (six) hours as needed for anxiety.   oxyCODONE 20 mg 12 hr tablet Commonly known as:  OXYCONTIN Take 1 tablet (20 mg total) by mouth every 12 (twelve) hours.   oxyCODONE-acetaminophen 10-325 MG tablet Commonly known as:  PERCOCET Take 1-2 tablets by mouth every 4 (four) hours as needed for pain.   pantoprazole 40 MG tablet Commonly known as:  PROTONIX Take 1 tablet (40 mg total) by mouth daily.   polyethylene glycol packet Commonly known as:  MIRALAX / GLYCOLAX 17grams in 6 oz of water twice a day until bowel movement.  LAXITIVE.  Restart if two days since  last bowel movement   rosuvastatin 20 MG tablet Commonly known as:  CRESTOR Take 1 tablet (20 mg total) by mouth daily.   tolterodine 4 MG 24 hr capsule Commonly known as:  DETROL LA Take 4 mg by mouth daily.       Review of Systems  Constitutional: Negative for activity change, appetite change, chills and fever.  HENT: Negative.   Eyes: Negative.   Respiratory: Negative for cough, chest tightness, shortness of breath and wheezing.  Cardiovascular: Negative for chest pain, palpitations and leg swelling.  Gastrointestinal: Negative for abdominal distention, abdominal pain, constipation, diarrhea, nausea and vomiting.  Genitourinary: Negative for dysuria, flank pain, frequency and urgency.  Musculoskeletal: Positive for gait problem.       Right knee surgical incision pain under control with current pain regimen.   Skin: Negative for color change, pallor and rash.       Right knee surgical incision   Neurological: Negative for dizziness, syncope, light-headedness and headaches.  Hematological: Does not bruise/bleed easily.  Psychiatric/Behavioral: Negative for agitation, confusion, hallucinations and sleep disturbance. The patient is not nervous/anxious.     Immunization History  Administered Date(s) Administered  . Influenza, High Dose Seasonal PF 01/23/2015, 12/04/2015  . Pneumococcal Conjugate-13 04/25/2015   Pertinent  Health Maintenance Due  Topic Date Due  . FOOT EXAM  01/10/1951  . OPHTHALMOLOGY EXAM  01/10/1951  . COLONOSCOPY  01/10/1991  . DEXA SCAN  01/09/2006  . PNA vac Low Risk Adult (2 of 2 - PPSV23) 04/24/2016  . HEMOGLOBIN A1C  06/02/2016  . URINE MICROALBUMIN  07/31/2016  . INFLUENZA VACCINE  Completed   Fall Risk  04/25/2015  Falls in the past year? No   Functional Status Survey:    Vitals:   01/11/16 1015  BP: 128/82  Pulse: 75  Resp: 18  Temp: 98.2 F (36.8 C)  SpO2: 93%  Weight: (!) 301 lb (136.5 kg)  Height: 5\' 7"  (1.702 m)   Body mass  index is 47.14 kg/m. Physical Exam  Constitutional: She is oriented to person, place, and time.  Obese in no acute distress.   HENT:  Head: Normocephalic.  Mouth/Throat: Oropharynx is clear and moist. No oropharyngeal exudate.  Eyes: Conjunctivae and EOM are normal. Pupils are equal, round, and reactive to light. Right eye exhibits no discharge. Left eye exhibits no discharge. No scleral icterus.  Neck: Normal range of motion. No thyromegaly present.  Cardiovascular: Normal rate, regular rhythm, normal heart sounds and intact distal pulses.  Exam reveals no gallop and no friction rub.   No murmur heard. Pulmonary/Chest: Effort normal and breath sounds normal. No respiratory distress. She has no wheezes. She has no rales.  Abdominal: Soft. Bowel sounds are normal. She exhibits no distension. There is no tenderness. There is no rebound and no guarding.  Musculoskeletal: She exhibits no edema, tenderness or deformity.  Unsteady gait. Uses walker.    Lymphadenopathy:    She has no cervical adenopathy.  Neurological: She is oriented to person, place, and time.  Skin: Skin is warm and dry. No rash noted. No erythema. No pallor.  Right knee surgical incision drsg dry, clean and intact. Surrounding skin tissue without any redness, swelling or tenderness to touch. Old purple-yellowish bruises on thigh area.    Psychiatric: She has a normal mood and affect.    Labs reviewed:  Recent Labs  04/18/15 0950  01/02/16 0545 01/03/16 0307 01/04/16 0431  NA 135  < > 137 139 140  K 4.6  < > 4.7 4.2 4.1  CL 99  < > 102 103 105  CO2 26  < > 26 26 27   GLUCOSE 112*  < > 131* 190* 183*  BUN 41*  < > 11 15 18   CREATININE 1.33*  < > 0.71 0.85 0.90  CALCIUM 9.1  < > 8.4* 8.8* 8.5*  PHOS 3.9  --   --   --   --   < > = values in this interval  not displayed.  Recent Labs  08/01/15 1450 12/04/15 1710 12/21/15 1019  AST 23 42* 27  ALT 54* 37* 17  ALKPHOS 137* 60 57  BILITOT 0.4 0.4 0.6  PROT 7.2  7.4 7.1  ALBUMIN 4.0 3.9 3.6    Recent Labs  12/21/15 1019  01/02/16 0545 01/03/16 0307 01/04/16 0431  WBC 6.8  < > 14.5* 13.1* 12.1*  NEUTROABS 4.0  --   --   --   --   HGB 14.8  < > 11.2* 10.5* 8.7*  HCT 46.5*  < > 35.1* 32.4* 27.5*  MCV 93.2  < > 91.9 90.3 91.1  PLT 265  < > 262 216 212  < > = values in this interval not displayed. Lab Results  Component Value Date   TSH 1.53 04/11/2015   Lab Results  Component Value Date   HGBA1C 6.4 12/04/2015   Lab Results  Component Value Date   CHOL 153 12/04/2015   HDL 57.80 12/04/2015   LDLCALC 67 12/04/2015   LDLDIRECT 62.0 12/04/2015   TRIG 141.0 12/04/2015   CHOLHDL 3 12/04/2015   Assessment/Plan 1. Leukocytosis, unspecified type  WBC 15.6 ( 01/10/2016). Afebrile. Exam finding negative for cough or urinary tract symptoms. Incision site without any signs of infections. Suspect possible due to dexamethasone use. Will continue to monitor CBC/diff for now. CBC/diff 01/15/2016.    2. Postoperative anemia Hgb 9.9, HCT 31.2 ( 01/10/2016) previous Hgb 8.7 (01/04/2016). Continue to monitor.   3. Mild protein-calorie malnutrition (HCC) Alb 3.30, TP 5.8 ( 01/10/2016).appetite has improved. RD consult for supplements. Monitor BMP     Family/ staff Communication: Reviewed plan of care with patient and facility Nurse supervisor.   Labs/tests ordered:  CBC/diff 01/15/2016.

## 2016-01-19 ENCOUNTER — Other Ambulatory Visit: Payer: Self-pay

## 2016-01-19 MED ORDER — OXYCODONE-ACETAMINOPHEN 10-325 MG PO TABS: 1.0000 | ORAL_TABLET | ORAL | 0 refills | Status: DC | PRN

## 2016-01-19 MED ORDER — OXYCODONE-ACETAMINOPHEN 10-325 MG PO TABS: ORAL_TABLET | ORAL | 0 refills | Status: DC

## 2016-01-24 LAB — BASIC METABOLIC PANEL WITH GFR
BUN: 24 mg/dL — AB (ref 4–21)
Creatinine: 0.7 mg/dL (ref 0.5–1.1)
Glucose: 158 mg/dL
Potassium: 4.7 mmol/L (ref 3.4–5.3)
Sodium: 139 mmol/L (ref 137–147)

## 2016-01-24 LAB — CBC AND DIFFERENTIAL
HCT: 37 % (ref 36–46)
Hemoglobin: 11.9 g/dL — AB (ref 12.0–16.0)
Platelets: 214 10*3/uL (ref 150–399)
WBC: 8.9 10^3/mL

## 2016-01-26 ENCOUNTER — Encounter: Payer: Self-pay | Admitting: Family

## 2016-01-26 ENCOUNTER — Non-Acute Institutional Stay (SKILLED_NURSING_FACILITY): Payer: Medicare Other | Admitting: Family

## 2016-01-26 DIAGNOSIS — E079 Disorder of thyroid, unspecified: Secondary | ICD-10-CM

## 2016-01-26 DIAGNOSIS — R3915 Urgency of urination: Secondary | ICD-10-CM

## 2016-01-26 DIAGNOSIS — I10 Essential (primary) hypertension: Secondary | ICD-10-CM

## 2016-01-26 DIAGNOSIS — E119 Type 2 diabetes mellitus without complications: Secondary | ICD-10-CM

## 2016-01-26 DIAGNOSIS — Z96651 Presence of right artificial knee joint: Secondary | ICD-10-CM

## 2016-01-26 DIAGNOSIS — R2681 Unsteadiness on feet: Secondary | ICD-10-CM | POA: Diagnosis not present

## 2016-01-26 DIAGNOSIS — E782 Mixed hyperlipidemia: Secondary | ICD-10-CM | POA: Diagnosis not present

## 2016-01-29 ENCOUNTER — Other Ambulatory Visit: Payer: Self-pay | Admitting: *Deleted

## 2016-01-29 ENCOUNTER — Telehealth: Payer: Self-pay | Admitting: Internal Medicine

## 2016-01-29 MED ORDER — LORAZEPAM 0.5 MG PO TABS: 0.5000 mg | ORAL_TABLET | Freq: Four times a day (QID) | ORAL | 0 refills | Status: DC | PRN

## 2016-01-29 MED ORDER — OXYCODONE HCL ER 20 MG PO T12A: 20.0000 mg | EXTENDED_RELEASE_TABLET | Freq: Two times a day (BID) | ORAL | 0 refills | Status: DC

## 2016-01-29 NOTE — Telephone Encounter (Signed)
Can you add to next Wednesday at 11.30 and notify rehab.

## 2016-01-29 NOTE — Telephone Encounter (Signed)
[facility] [phone] Fax: [phone]  

## 2016-01-29 NOTE — Telephone Encounter (Signed)
Rosemary from [facility] called to make an appt for pt to have a 2 week follow post rehab. Pt is being discharged today. Please advise as to where to schedule. Thank you!  Call pt @ [phone]

## 2016-02-01 ENCOUNTER — Telehealth: Payer: Self-pay | Admitting: *Deleted

## 2016-02-01 NOTE — Telephone Encounter (Signed)
Pt will keep appt.

## 2016-02-01 NOTE — Telephone Encounter (Signed)
Dr. Darrick Huntsmanullo, doesn't have any earlier appts.  If she wants to see another provider sooner, then she can otherwise the 15th is the earliest. thanks

## 2016-02-01 NOTE — Telephone Encounter (Signed)
There are no earlier appt available other then her scheduled 1130.  Please advise. thanks

## 2016-02-01 NOTE — Telephone Encounter (Signed)
She can see her surgeon of it needs to be sooner, or  If it is a medical issue, one of the other providers,

## 2016-02-01 NOTE — Telephone Encounter (Addendum)
[name]  demanded to have an earlier appt on 02-07-16, she requested to have a morning appt with in ten days. Pt contact [phone]

## 2016-02-04 NOTE — Progress Notes (Signed)
Location:  [facility] Room Number: 1008-P Place of Service:  SNF (31)  Provider: Richarda Blade FNP-C   PCP: Sherlene Shams, MD Patient Care Team: Sherlene Shams, MD as PCP - General (Internal Medicine)  Extended Emergency Contact Information Primary Emergency Contact: Renae Gloss Address: [address]          [place], [place] [address] [place] Mobile Phone: [phone] Relation: Daughter  Code Status: Full Code  Goals of care:  Advanced Directive information Advanced Directives 01/05/2016  Does patient have an advance directive? No  Type of Advance Directive -  Copy of advanced directive(s) in chart? -  Would patient like information on creating an advanced directive? No - patient declined information     Allergies  Allergen Reactions  . Erythromycin Rash  . Iodinated Diagnostic Agents Swelling and Rash  . Fentanyl Other (See Comments)    Duragesic-25  . Latex Itching and Rash  . Lisinopril Other (See Comments)    Hyperkalemia     Chief Complaint  Patient presents with  . Discharge Note    Discharge home     HPI:  75 y.o. female seen today at  [facility] for discharge home. She was here for short term rehabilitation post hospital admission from 01/01/16-01/04/16 with primary right knee OA. She underwent right total knee arthroplasty. She has a medical history of HTN, Type 2 DM, Hypothyroidism, OA, CKD, Anxiety among other conditions. She is seen in her room today. She states very excited to be going home soon. She denies any acute issues this visit.She states right knee pain under control with current pain regimen.She has worked with PT/OT now stable for discharge home. She will be discharged home with Home health PT/OT to continue with ROM, Exercise, Gait stability and muscle strengthening. She will require standard WC with Cushion, anti tippers, extended brake handles, removable  elevating leg rests  to enable her to maintain current level of independence for activities and tranfers unable to achieve with FWW. Patient's daughter available to propel wheelchair. Home health services will be arranged by facility social worker prior to discharge. Prescription medication will be written x 1 month then patient to follow up with PCP in 1-2 weeks. Facility staff report no new concerns.      Past Medical History:  Diagnosis Date  . Anxiety   . Arthritis   . Chronic back pain    stenosis and arthritis  . Complication of anesthesia    anxious when she wakes up  . GERD (gastroesophageal reflux disease)    takes Pantoprazole daily  . Glaucoma    dry angle  . History of bronchitis    30+ yrs ago  . History of MRSA infection 2004   several yrs ago  . Hyperlipidemia    takes Zetia and Crestor daily  . Hypertension    take Amlodipine daily  . Hypothyroidism    takes Synthroid daily  . Joint pain   . Joint swelling   . Neuropathy (HCC)    takes Gabapentin daily  . Peripheral edema    takes Furosemide daily as needed  . Pneumonia    hx of-30 + yrs ago  . Pre-diabetes    takes Victoza daily  . Primary localized osteoarthritis of right knee   . Subdural hematoma (HCC) 8 yrs ago   hx of  . Urinary frequency   . Urinary urgency  Past Surgical History:  Procedure Laterality Date  . ABDOMINAL HYSTERECTOMY    . COLONOSCOPY    . RADIOLOGY WITH ANESTHESIA N/A 08/22/2015   Procedure: MRI OF LUMBAR SPINE   (RADIOLOGY WITH ANESTHESIA);  Surgeon: Medication Radiologist, MD;  Location: [facility] OR;  Service: Radiology;  Laterality: N/A;  . TOTAL HIP ARTHROPLASTY Right   . TOTAL HIP ARTHROPLASTY Left   . TOTAL KNEE ARTHROPLASTY Left   . TOTAL KNEE ARTHROPLASTY Right 01/01/2016   Procedure: TOTAL KNEE ARTHROPLASTY;  Surgeon: Salvatore Marvel, MD;  Location: [facility] OR;  Service: Orthopedics;  Laterality: Right;      reports that she has never smoked. She has never used smokeless  tobacco. She reports that she does not drink alcohol or use drugs. Social History   Social History  . Marital status: Widowed    Spouse name: N/A  . Number of children: N/A  . Years of education: N/A   Occupational History  . Not on file.   Social History Main Topics  . Smoking status: Never Smoker  . Smokeless tobacco: Never Used  . Alcohol use No  . Drug use: No  . Sexual activity: Not on file   Other Topics Concern  . Not on file   Social History Narrative  . No narrative on file   Functional Status Survey:    Allergies  Allergen Reactions  . Erythromycin Rash  . Iodinated Diagnostic Agents Swelling and Rash  . Fentanyl Other (See Comments)    Duragesic-25  . Latex Itching and Rash  . Lisinopril Other (See Comments)    Hyperkalemia     Pertinent  Health Maintenance Due  Topic Date Due  . FOOT EXAM  01/10/1951  . OPHTHALMOLOGY EXAM  01/10/1951  . COLONOSCOPY  01/10/1991  . DEXA SCAN  01/09/2006  . PNA vac Low Risk Adult (2 of 2 - PPSV23) 04/24/2016  . HEMOGLOBIN A1C  06/02/2016  . URINE MICROALBUMIN  07/31/2016  . INFLUENZA VACCINE  Completed    Medications:   Medication List       Accurate as of 01/26/16 11:59 PM. Always use your most recent med list.          acetaminophen 325 MG tablet Commonly known as:  TYLENOL Take 2 tablets (650 mg total) by mouth every 6 (six) hours as needed for mild pain (or Fever >/= 101).   amLODipine 5 MG tablet Commonly known as:  NORVASC Take 1 tablet (5 mg total) by mouth daily.   cycloSPORINE 0.05 % ophthalmic emulsion Commonly known as:  RESTASIS Place 1 drop into both eyes 2 (two) times daily.   dexamethasone 1 MG tablet Commonly known as:  DECADRON Take 1 mg by mouth 2 (two) times daily with a meal. Stop date 02/08/16   docusate sodium 100 MG capsule Commonly known as:  COLACE 1 tab 2 times a day while on narcotics.  STOOL SOFTENER   DULoxetine 30 MG capsule Commonly known as:  CYMBALTA Take 30  mg by mouth 2 (two) times daily.   enoxaparin 30 MG/0.3ML injection Commonly known as:  LOVENOX Inject 0.3 mLs (30 mg total) into the skin every 12 (twelve) hours.   ergocalciferol 50000 units capsule Commonly known as:  DRISDOL Take 1 capsule (50,000 Units total) by mouth once a week.   ezetimibe 10 MG tablet Commonly known as:  ZETIA Take 1 tablet (10 mg total) by mouth daily.   furosemide 20 MG tablet Commonly known as:  LASIX Take 1 tablet (  20 mg total) by mouth daily as needed for fluid or edema.   gabapentin 300 MG capsule Commonly known as:  NEURONTIN Take 1 capsule (300 mg total) by mouth 2 (two) times daily.   levothyroxine 200 MCG tablet Commonly known as:  SYNTHROID, LEVOTHROID Take 1 tablet (200 mcg total) by mouth daily before breakfast.   liraglutide 18 MG/3ML Sopn Inject 0.6 mg into the skin daily.   LORazepam 0.5 MG tablet Commonly known as:  ATIVAN Take 0.5 mg by mouth every 6 (six) hours as needed for anxiety.   oxyCODONE 20 mg 12 hr tablet Commonly known as:  OXYCONTIN Take 1 tablet (20 mg total) by mouth every 12 (twelve) hours.   oxyCODONE-acetaminophen 10-325 MG tablet Commonly known as:  PERCOCET Take 1-2 tablets by mouth every 4 (four) hours as needed for pain.   pantoprazole 40 MG tablet Commonly known as:  PROTONIX Take 1 tablet (40 mg total) by mouth daily.   polyethylene glycol packet Commonly known as:  MIRALAX / GLYCOLAX 17grams in 6 oz of water twice a day until bowel movement.  LAXITIVE.  Restart if two days since last bowel movement   rosuvastatin 20 MG tablet Commonly known as:  CRESTOR Take 1 tablet (20 mg total) by mouth daily.   tolterodine 4 MG 24 hr capsule Commonly known as:  DETROL LA Take 4 mg by mouth 2 (two) times daily.       Review of Systems  Constitutional: Negative for activity change, appetite change, chills and fever.  HENT: Negative.   Eyes: Negative.   Respiratory: Negative for cough, chest tightness,  shortness of breath and wheezing.   Cardiovascular: Negative for chest pain, palpitations and leg swelling.  Gastrointestinal: Negative for abdominal distention, abdominal pain, constipation, diarrhea, nausea and vomiting.  Endocrine: Negative.   Genitourinary: Negative for dysuria, flank pain, frequency and urgency.  Musculoskeletal: Positive for gait problem.       Right knee surgical incision pain  Skin: Negative for color change, pallor and rash.       Right knee surgical incision   Neurological: Negative for dizziness, syncope, light-headedness and headaches.  Hematological: Does not bruise/bleed easily.  Psychiatric/Behavioral: Negative for agitation, confusion, hallucinations and sleep disturbance. The patient is not nervous/anxious.     Vitals:   01/26/16 0935  BP: 132/78  Pulse: 76  Resp: 16  Temp: 98.4 F (36.9 C)  TempSrc: Oral  SpO2: 96%  Weight: 296 lb 12.8 oz (134.6 kg)  Height: 5\' 6"  (1.676 m)   Body mass index is 47.9 kg/m. Physical Exam  Constitutional: She is oriented to person, place, and time.  Obese in no acute distress.   HENT:  Head: Normocephalic.  Mouth/Throat: Oropharynx is clear and moist. No oropharyngeal exudate.  Eyes: Conjunctivae and EOM are normal. Pupils are equal, round, and reactive to light. Right eye exhibits no discharge. Left eye exhibits no discharge. No scleral icterus.  Neck: Normal range of motion. No thyromegaly present.  Cardiovascular: Normal rate, regular rhythm, normal heart sounds and intact distal pulses.  Exam reveals no gallop and no friction rub.   No murmur heard. Pulmonary/Chest: Effort normal and breath sounds normal. No respiratory distress. She has no wheezes. She has no rales.  Abdominal: Soft. Bowel sounds are normal. She exhibits no distension. There is no tenderness. There is no rebound and no guarding.  Genitourinary:  Genitourinary Comments: Continent   Musculoskeletal: She exhibits no edema, tenderness or  deformity.  Unsteady gait.Uses walker for  short distance.    Lymphadenopathy:    She has no cervical adenopathy.  Neurological: She is oriented to person, place, and time.  Skin: Skin is warm and dry. No rash noted. No erythema. No pallor.  Right knee surgical incision drsg dry, clean and intact. Surrounding skin tissue without any redness, swelling or tenderness to touch. Old purple bruises on thigh area progressive healing.     Psychiatric: She has a normal mood and affect.    Labs reviewed: Basic Metabolic Panel:  Recent Labs  78/29/5601/24/17 0950  01/02/16 0545 01/03/16 0307 01/04/16 0431 01/10/16 01/24/16  NA 135  < > 137 139 140 141 139  K 4.6  < > 4.7 4.2 4.1 4.0 4.7  CL 99  < > 102 103 105  --   --   CO2 26  < > 26 26 27   --   --   GLUCOSE 112*  < > 131* 190* 183*  --   --   BUN 41*  < > 11 15 18 21  24*  CREATININE 1.33*  < > 0.71 0.85 0.90 0.8 0.7  CALCIUM 9.1  < > 8.4* 8.8* 8.5*  --   --   PHOS 3.9  --   --   --   --   --   --   < > = values in this interval not displayed. Liver Function Tests:  Recent Labs  08/01/15 1450 12/04/15 1710 12/21/15 1019 01/10/16  AST 23 42* 27 14  ALT 54* 37* 17 24  ALKPHOS 137* 60 57 80  BILITOT 0.4 0.4 0.6  --   PROT 7.2 7.4 7.1  --   ALBUMIN 4.0 3.9 3.6  --    CBC:  Recent Labs  12/21/15 1019  01/02/16 0545 01/03/16 0307 01/04/16 0431 01/10/16 01/24/16  WBC 6.8  < > 14.5* 13.1* 12.1* 15.6 8.9  NEUTROABS 4.0  --   --   --   --   --   --   HGB 14.8  < > 11.2* 10.5* 8.7* 9.9* 11.9*  HCT 46.5*  < > 35.1* 32.4* 27.5* 31* 37  MCV 93.2  < > 91.9 90.3 91.1  --   --   PLT 265  < > 262 216 212 368 214  < > = values in this interval not displayed.   Recent Labs  01/04/16 0559 01/04/16 1120 01/04/16 1648  GLUCAP 149* 177* 186*   Assessment/Plan:   1. Unsteady gait S/p short term rehabilitation post hospital admission from 01/01/16-01/04/16 with primary right knee OA. Post right total knee Arthroplasty. [facility]   PT/OT to continue with ROM, Exercise, Gait stability and muscle strengthening. DME: standard WC with Cushion, anti tippers, extended brake handles, removable elevating leg rests  to enable her to maintain current level of independence for activities and tranfers unable to achieve with FWW. Fall and safety precautions.   2. Essential hypertension, benign B/p stable. Continue on Amlodipine. BMP in 1-2 weeks with PCP  3. Thyroid disease Continue on Levothyroxine 200 mcg Tablet. TSH level with PCP   4. Mixed hyperlipidemia Continue on Crestor and Zetia. Lipid panel with PCP.   5. Urinary urgency Continue on Detrol LA.   6. Diabetes mellitus without complication (HCC) CBG's ranging in the 110's-200's in AM with X 1 episode 300's and 100's-200's in the afternoon. Continue on Liraglutide. Encourage CC diet. Hgb A1C with PCP.   7.S/p right knee Arthroplasty  Incision without any signs of infections.[facility]  PT/OT to continue with ROM, Exercise, Gait stability and muscle strengthening.continue current [pain regimen. Wean off pain meds as tolerated. Continue Lovenox for DVT prophylaxis. Follow up with Ortho as directed. CBC in 1-2 weeks with PCP.    Patient is being discharged with the following home health services:  [facility]  PT/OT to continue with ROM, Exercise, Gait stability and muscle strengthening.  Patient is being discharged with the following durable medical equipment:    -standard WC with Cushion, anti tippers, extended brake handles, removable elevating leg rests  to enable her to maintain current level of independence for activities and tranfers unable to achieve with FWW.  Patient has been advised to f/u with their PCP in 1-2 weeks to for a transitions of care visit. Social services at their facility was responsible for arranging this appointment. Pt was provided with adequate prescriptions of noncontrolled medications to reach the scheduled appointment. For controlled  substances, a limited supply was provided as appropriate for the individual patient.If the pt normally receives these medications from a pain clinic or has a contract with another physician, these medications should be received from that clinic or physician only).    Future labs/tests needed: CBC, BMP in 1-2 weeks with PCP.

## 2016-02-07 ENCOUNTER — Ambulatory Visit: Payer: Medicare Other | Admitting: Internal Medicine

## 2016-02-08 ENCOUNTER — Telehealth: Payer: Self-pay | Admitting: Internal Medicine

## 2016-02-08 NOTE — Telephone Encounter (Signed)
The patient came into the office today for a face to face visit with her PCP. Upon arrival she was informed that her appointment was canceled by the automated system  He appointment was rescheduled for 11.27.17 at 11:00.

## 2016-02-08 NOTE — Telephone Encounter (Signed)
Patient and daughter arrived today and she was rescheduled for the 27th.  Daughter is concerned about physical therapy being discontinued .  Will follow up after next appt.

## 2016-02-19 ENCOUNTER — Ambulatory Visit (INDEPENDENT_AMBULATORY_CARE_PROVIDER_SITE_OTHER): Payer: Medicare Other | Admitting: Internal Medicine

## 2016-02-19 ENCOUNTER — Other Ambulatory Visit: Payer: Self-pay

## 2016-02-19 ENCOUNTER — Encounter: Payer: Self-pay | Admitting: Internal Medicine

## 2016-02-19 ENCOUNTER — Telehealth: Payer: Self-pay | Admitting: *Deleted

## 2016-02-19 DIAGNOSIS — M1711 Unilateral primary osteoarthritis, right knee: Secondary | ICD-10-CM | POA: Diagnosis not present

## 2016-02-19 DIAGNOSIS — F411 Generalized anxiety disorder: Secondary | ICD-10-CM

## 2016-02-19 DIAGNOSIS — E119 Type 2 diabetes mellitus without complications: Secondary | ICD-10-CM | POA: Diagnosis not present

## 2016-02-19 DIAGNOSIS — E875 Hyperkalemia: Secondary | ICD-10-CM

## 2016-02-19 MED ORDER — SERTRALINE HCL 50 MG PO TABS: 50.0000 mg | ORAL_TABLET | Freq: Every day | ORAL | 1 refills | Status: DC

## 2016-02-19 MED ORDER — OXYCODONE-ACETAMINOPHEN 10-325 MG PO TABS: 1.0000 | ORAL_TABLET | ORAL | 0 refills | Status: DC | PRN

## 2016-02-19 MED ORDER — OXYCODONE HCL ER 20 MG PO T12A: 20.0000 mg | EXTENDED_RELEASE_TABLET | Freq: Two times a day (BID) | ORAL | 0 refills | Status: DC

## 2016-02-19 MED ORDER — OXYCODONE HCL ER 15 MG PO T12A: 15.0000 mg | EXTENDED_RELEASE_TABLET | Freq: Two times a day (BID) | ORAL | 0 refills | Status: DC

## 2016-02-19 MED ORDER — LORAZEPAM 0.5 MG PO TABS: 0.5000 mg | ORAL_TABLET | Freq: Three times a day (TID) | ORAL | 1 refills | Status: DC | PRN

## 2016-02-19 NOTE — Telephone Encounter (Signed)
Faxed to pharmacy

## 2016-02-19 NOTE — Telephone Encounter (Signed)
Spoke with the daughter, she [facility] and then the Ativan needs to be sent there also.  They did pick it up on the 6th and she has 5 pills left.  She has been taking 2-3 pills a day.    Please advise. Thanks

## 2016-02-19 NOTE — Telephone Encounter (Signed)
[name] stated that, the Rx's from today's visit was not at the pharmacy [facility]

## 2016-02-19 NOTE — Progress Notes (Signed)
Pre-visit discussion using our clinic review tool. No additional management support is needed unless otherwise documented below in the visit note.  

## 2016-02-19 NOTE — Patient Instructions (Addendum)
Resume the ativan three times daily.  Make sure one of the doses is BEFORE THE PT ARRIVES.   Alternate between percocet and ativan .  No percocet at bedtime.   try to reduce the nighttime to one tablet if you wake up in pain   Starting generic zoloft to help manage your anxiety.  Please start the  Zoloft (sertraline) at 1/2 tablet daily in the evening for the first few days to avoid nausea.  You can increase to a full tablet after 4 days if you havenot developed side effects of nausea.  If the sertraline  interferes with your sleep, take it in the morning instead  Please return in 6 weeks ,  Or e mail me to let me know how it is helping your depression

## 2016-02-19 NOTE — Telephone Encounter (Signed)
Script[facility].

## 2016-02-19 NOTE — Progress Notes (Signed)
Subjective:  Patient ID: Bonnie GashElizabeth A Hinton, female    DOB: 24-Apr-1940  Age: 75 y.o. MRN: [id number]  CC: Diagnoses of Diabetes mellitus without complication (HCC), Hyperkalemia, Primary osteoarthritis of right knee, and Anxiety, generalized were pertinent to this visit.  HPI Bonnie Gashlizabeth A Hinton presents for follow up on persistent knee pain and anxiety  total right knee knee replacement that was done one month ago by Milinda AntisJim [facility]ooten at [facility]. She is accompanied by her daughter who is a licensed physical therapist and has been supervising her mother's use of medications and progress with PT.  Daughter provides most of the helpful information today.  Patient'sr recovery has been slow and complicated by anxiety.  dAughter notes that the addition of lorazepam that was prescribed by Orthopedics PA has helped her mother's cooperation with PT and a lot.  PT is still coming to the house 3/week for 2 more weeks. This is a  Hard deadline by Ortho.    Outpatient PT is to be ordered following the completion of the prescri bed home PT Orthopedics .  There  Is considerable confusion about patient's use of lorazepam, based on calls to patient's pharmacy after the visit ended  .  Per daughter,  Patient continues to benefit from use of  oxycontin every 12 hours (6A and 7 P) .  Since daughter returned to her own home patient has been taking 2 percocet at bedtime and will repeat 2 more tablets 4 hours later if she wakes up in pain.   Her pain is aggravated significantly by  PT who has been forcing her right knee to full extension. The pain continues into the following day. Daughter was giving the patient ativan 3 times daily which seemed to alleviate her pain somewhat without sedating her,  But since she left her mother's home patient has reverted to using the percocet and is only taking the ativan twice daily.  Apparently has 5 tablet left from #120 prescribe on Nov 6th by PA.   Has gained 11 lb since the surgery due to  inactivity.   Discussed therapy for anxiety with addition of sertraline.  She is tolerating cymbalta well.     Outpatient Medications Prior to Visit  Medication Sig Dispense Refill  . acetaminophen (TYLENOL) 325 MG tablet Take 2 tablets (650 mg total) by mouth every 6 (six) hours as needed for mild pain (or Fever >/= 101).    Marland Kitchen. amLODipine (NORVASC) 5 MG tablet Take 1 tablet (5 mg total) by mouth daily. 90 tablet 3  . cycloSPORINE (RESTASIS) 0.05 % ophthalmic emulsion Place 1 drop into both eyes 2 (two) times daily.    Marland Kitchen. dexamethasone (DECADRON) 1 MG tablet Take 1 mg by mouth 2 (two) times daily with a meal. Stop date 02/08/16    . docusate sodium (COLACE) 100 MG capsule 1 tab 2 times a day while on narcotics.  STOOL SOFTENER 60 capsule 0  . DULoxetine (CYMBALTA) 30 MG capsule Take 30 mg by mouth 2 (two) times daily.    Marland Kitchen. enoxaparin (LOVENOX) 30 MG/0.3ML injection Inject 0.3 mLs (30 mg total) into the skin every 12 (twelve) hours. 30 Syringe 0  . ergocalciferol (DRISDOL) 50000 units capsule Take 1 capsule (50,000 Units total) by mouth once a week. 12 capsule 0  . ezetimibe (ZETIA) 10 MG tablet Take 1 tablet (10 mg total) by mouth daily. 90 tablet 3  . furosemide (LASIX) 20 MG tablet Take 1 tablet (20 mg total) by mouth daily as needed  for fluid or edema. 90 tablet 1  . gabapentin (NEURONTIN) 300 MG capsule Take 1 capsule (300 mg total) by mouth 2 (two) times daily. 60 capsule 0  . levothyroxine (SYNTHROID, LEVOTHROID) 200 MCG tablet Take 1 tablet (200 mcg total) by mouth daily before breakfast. 90 tablet 1  . Liraglutide 18 MG/3ML SOPN Inject 0.6 mg into the skin daily.     . pantoprazole (PROTONIX) 40 MG tablet Take 1 tablet (40 mg total) by mouth daily. 90 tablet 3  . polyethylene glycol (MIRALAX / GLYCOLAX) packet 17grams in 6 oz of water twice a day until bowel movement.  LAXITIVE.  Restart if two days since last bowel movement 14 each 0  . rosuvastatin (CRESTOR) 20 MG tablet Take 1 tablet  (20 mg total) by mouth daily. 90 tablet 1  . tolterodine (DETROL LA) 4 MG 24 hr capsule Take 4 mg by mouth 2 (two) times daily.     Marland Kitchen. LORazepam (ATIVAN) 0.5 MG tablet Take 1 tablet (0.5 mg total) by mouth every 6 (six) hours as needed for anxiety or sleep. 120 tablet 0  . oxyCODONE (OXYCONTIN) 20 mg 12 hr tablet Take 1 tablet (20 mg total) by mouth every 12 (twelve) hours. 60 tablet 0  . oxyCODONE-acetaminophen (PERCOCET) 10-325 MG tablet Take 1-2 tablets by mouth every 4 (four) hours as needed for pain.     No facility-administered medications prior to visit.     Review of Systems;  Patient denies headache, fevers, malaise, unintentional weight loss, skin rash, eye pain, sinus congestion and sinus pain, sore throat, dysphagia,  hemoptysis , cough, dyspnea, wheezing, chest pain, palpitations, orthopnea, edema, abdominal pain, nausea, melena, diarrhea, constipation, flank pain, dysuria, hematuria, urinary  Frequency, nocturia, numbness, tingling, seizures,  Focal weakness, Loss of consciousness,  Tremor, insomnia, depression, , and suicidal ideation.      Objective:  BP (!) 148/82   Pulse 94   Temp 97.8 F (36.6 C) (Oral)   Resp 12   Ht 5\' 6"  (1.676 m)   Wt (!) 311 lb (141.1 kg)   SpO2 96%   BMI 50.20 kg/m   BP Readings from Last 3 Encounters:  02/19/16 (!) 148/82  01/26/16 132/78  01/11/16 128/82    Wt Readings from Last 3 Encounters:  02/19/16 (!) 311 lb (141.1 kg)  01/26/16 296 lb 12.8 oz (134.6 kg)  01/11/16 (!) 301 lb (136.5 kg)    General appearance: alert, cooperative and appears stated age. Seated in wheelchair.  Ears: normal TM's and external ear canals both ears Throat: lips, mucosa, and tongue normal; teeth and gums normal Neck: no adenopathy, no carotid bruit, supple, symmetrical, trachea midline and thyroid not enlarged, symmetric, no tenderness/mass/nodules Back: symmetric, no curvature. ROM normal. No CVA tenderness. Lungs: clear to auscultation  bilaterally Heart: regular rate and rhythm, S1, S2 normal, no murmur, click, rub or gallop MSK: right knee surgical incision healing well.  No warmth,  Some tenderness  Pulses: 2+ and symmetric Skin: Skin color, texture, turgor normal. No rashes or lesions Lymph nodes: Cervical, supraclavicular, and axillary nodes normal.  Lab Results  Component Value Date   HGBA1C 6.4 12/04/2015   HGBA1C 6.3 08/01/2015   HGBA1C 6.0 04/11/2015    Lab Results  Component Value Date   CREATININE 0.7 01/24/2016   CREATININE 0.8 01/10/2016   CREATININE 0.90 01/04/2016    Lab Results  Component Value Date   WBC 8.9 01/24/2016   HGB 11.9 (A) 01/24/2016   HCT 37 01/24/2016  PLT 214 01/24/2016   GLUCOSE 183 (H) 01/04/2016   CHOL 153 12/04/2015   TRIG 141.0 12/04/2015   HDL 57.80 12/04/2015   LDLDIRECT 62.0 12/04/2015   LDLCALC 67 12/04/2015   ALT 24 01/10/2016   AST 14 01/10/2016   NA 139 01/24/2016   K 4.7 01/24/2016   CL 105 01/04/2016   CREATININE 0.7 01/24/2016   BUN 24 (A) 01/24/2016   CO2 27 01/04/2016   TSH 1.53 04/11/2015   INR 1.05 12/21/2015   HGBA1C 6.4 12/04/2015   MICROALBUR 1.4 08/01/2015    No results found.  Assessment & Plan:   Problem List Items Addressed This Visit    Anxiety, generalized    I HAVE REFIILLED  the lorazepam for use a maximumof 3 daily.  SSRI therapy also advised,  Sertraline chosen.       Arthritis of knee, degenerative    S/p TKR right knee one Oct 2017 by Hooten. Still receiving home PT . Discussed need to wean her from her chronic narcotics .  I have refilled the Oxycontin for the month of December at the current dose.  January refill will be at lower dose of 15 mg bid. I have refilled the oxycodone for a maximum of 4 tablets daily for December, and will continue to reduce monthly       Relevant Medications   oxyCODONE-acetaminophen (PERCOCET) 10-325 MG tablet   oxyCODONE-acetaminophen (PERCOCET) 10-325 MG tablet   oxyCODONE (OXYCONTIN) 15  mg 12 hr tablet   Diabetes mellitus without complication (HCC)    Well controlled,  Next a1c due in mid December ,    Lab Results  Component Value Date   HGBA1C 6.4 12/04/2015         Hyperkalemia    resolved by recent labs  Since stopping lisinopril and celebrex.   Lab Results  Component Value Date   NA 139 01/24/2016   K 4.7 01/24/2016   CL 105 01/04/2016   CO2 27 01/04/2016           A total of 40 minutes was spent with patient more than half of which was spent in counseling patient on the above mentioned issues , reviewing and explaining recent labs and imaging studies done, and coordination of care.  I have discontinued Ms. Dimaano's oxyCODONE. I have also changed her oxyCODONE-acetaminophen, oxyCODONE-acetaminophen, and oxyCODONE. Additionally, I am having her maintain her liraglutide, ezetimibe, rosuvastatin, furosemide, levothyroxine, pantoprazole, amLODipine, ergocalciferol, DULoxetine, acetaminophen, docusate sodium, enoxaparin, gabapentin, polyethylene glycol, tolterodine, cycloSPORINE, and dexamethasone.  Meds ordered this encounter  Medications  . oxyCODONE-acetaminophen (PERCOCET) 10-325 MG tablet    Sig: Take 1 tablet by mouth every 4 (four) hours as needed. May refill on or after March 04 2016    Dispense:  160 tablet    Refill:  0  . DISCONTD: sertraline (ZOLOFT) 50 MG tablet    Sig: Take 1 tablet (50 mg total) by mouth daily.    Dispense:  90 tablet    Refill:  1  . DISCONTD: oxyCODONE (OXYCONTIN) 20 mg 12 hr tablet    Sig: Take 1 tablet (20 mg total) by mouth every 12 (twelve) hours. May refill on or after Feb 28 2016    Dispense:  60 tablet    Refill:  0  . oxyCODONE-acetaminophen (PERCOCET) 10-325 MG tablet    Sig: Take 1-2 tablets by mouth every 4 (four) hours as needed for pain. May refil on or after Apr 04 2016    Dispense:  120 tablet    Refill:  0  . oxyCODONE (OXYCONTIN) 15 mg 12 hr tablet    Sig: Take 1 tablet (15 mg total) by mouth  every 12 (twelve) hours. May refill on or after Mar 30 2016    Dispense:  60 tablet    Refill:  0    Medications Discontinued During This Encounter  Medication Reason  . oxyCODONE (OXYCONTIN) 20 mg 12 hr tablet Reorder  . oxyCODONE-acetaminophen (PERCOCET) 10-325 MG tablet Reorder  . oxyCODONE (OXYCONTIN) 20 mg 12 hr tablet Reorder    Follow-up: Return in about 2 months (around 04/25/2016).   Sherlene Shams, MD

## 2016-02-20 DIAGNOSIS — F411 Generalized anxiety disorder: Secondary | ICD-10-CM | POA: Insufficient documentation

## 2016-02-20 NOTE — Assessment & Plan Note (Signed)
I HAVE REFIILLED  the lorazepam for use a maximumof 3 daily.  SSRI therapy also advised,  Sertraline chosen.

## 2016-02-20 NOTE — Assessment & Plan Note (Signed)
Well controlled,  Next a1c due in mid December ,    Lab Results  Component Value Date   HGBA1C 6.4 12/04/2015

## 2016-02-20 NOTE — Assessment & Plan Note (Signed)
resolved by recent labs  Since stopping lisinopril and celebrex.   Lab Results  Component Value Date   NA 139 01/24/2016   K 4.7 01/24/2016   CL 105 01/04/2016   CO2 27 01/04/2016

## 2016-02-20 NOTE — Assessment & Plan Note (Addendum)
S/p TKR right knee one Oct 2017 by Hooten. Still receiving home PT . Discussed need to wean her from her chronic narcotics .  I have refilled the Oxycontin for the month of December at the current dose.  January refill will be at lower dose of 15 mg bid. I have refilled the oxycodone for a maximum of 4 tablets daily for December, and will continue to reduce monthly

## 2016-02-21 ENCOUNTER — Telehealth: Payer: Self-pay

## 2016-02-21 NOTE — Telephone Encounter (Signed)
PA for Ativan completed on Cover my meds

## 2016-02-23 NOTE — Telephone Encounter (Signed)
Spoke with [facility] and approved from 01/22/16-02/22/2017.

## 2016-03-05 ENCOUNTER — Ambulatory Visit: Payer: Medicare Other | Admitting: Internal Medicine

## 2016-03-13 ENCOUNTER — Other Ambulatory Visit: Payer: Self-pay | Admitting: Internal Medicine

## 2016-03-19 ENCOUNTER — Telehealth: Payer: Self-pay | Admitting: Internal Medicine

## 2016-03-19 NOTE — Telephone Encounter (Signed)
Left message for patient to call office.  

## 2016-03-19 NOTE — Telephone Encounter (Signed)
Talked with patient daughter and she is concerned since patient cannot do in home PT any longer that she has become unmotivated and depression has worsened, daughter is also concerned patient is not taking medication as prescribed. Discussed with daughter nurse doing a follow up call with patient  As to how she is doing , "because daughter does not want mother to know she called "during call have patient come in for follow up 03/27/16 at 11.30 to discuss patient progress and that she is to bring in medications to see how she is managing those meds.  Patient will let daughter see all meds but her pain medication , daughter was asked if mother was over sedated she advised no but that she does not know whether she is taking appropriately. Patient is at PT with daughter at this time. Patient does no exercises at home and is unmotivated to do any walking or to get out of bed or recliner. Daughter is wondering if patient may need counseling and of patient pain is depression pain , or actual physical pain. No appts availabe except for the date given. FYI

## 2016-03-19 NOTE — Telephone Encounter (Signed)
Spoke with Olegario Messierkathy at length about this patient and daughter who called.  There appears to be no imminent need for sooner appt at this time.  She will see PCP as scheduled

## 2016-03-19 NOTE — Telephone Encounter (Signed)
Pt daughter [name] called and stated that pt has not done any pt for her pt and has hardly moved. She is unmotivated and has been sleep. Daughter is unsure if it from the new medication of sertraline (ZOLOFT) 50 MG tablet, pt has barely moved in 5 days. Daughter keeps trying to motivate her and nothing pt shuts down. Daughter also states that she is not taking her medication the way she is supposed to. Please advise, thank you!  [facility] [phone]

## 2016-03-20 NOTE — Telephone Encounter (Signed)
Left detailed message on home phone and on daughters mobile phone.

## 2016-03-20 NOTE — Telephone Encounter (Signed)
Patient scheduled for 11.30 on 03/27/16.

## 2016-03-27 ENCOUNTER — Encounter: Payer: Self-pay | Admitting: Internal Medicine

## 2016-03-27 ENCOUNTER — Ambulatory Visit (INDEPENDENT_AMBULATORY_CARE_PROVIDER_SITE_OTHER): Payer: Medicare Other | Admitting: Internal Medicine

## 2016-03-27 VITALS — BP 140/70 | HR 84 | Temp 97.9°F | Resp 16 | Ht 65.0 in | Wt 311.0 lb

## 2016-03-27 DIAGNOSIS — Z96651 Presence of right artificial knee joint: Secondary | ICD-10-CM

## 2016-03-27 DIAGNOSIS — E1122 Type 2 diabetes mellitus with diabetic chronic kidney disease: Secondary | ICD-10-CM | POA: Diagnosis not present

## 2016-03-27 DIAGNOSIS — E559 Vitamin D deficiency, unspecified: Secondary | ICD-10-CM

## 2016-03-27 DIAGNOSIS — E782 Mixed hyperlipidemia: Secondary | ICD-10-CM

## 2016-03-27 DIAGNOSIS — I129 Hypertensive chronic kidney disease with stage 1 through stage 4 chronic kidney disease, or unspecified chronic kidney disease: Secondary | ICD-10-CM

## 2016-03-27 DIAGNOSIS — N189 Chronic kidney disease, unspecified: Secondary | ICD-10-CM | POA: Diagnosis not present

## 2016-03-27 DIAGNOSIS — F411 Generalized anxiety disorder: Secondary | ICD-10-CM

## 2016-03-27 DIAGNOSIS — E039 Hypothyroidism, unspecified: Secondary | ICD-10-CM

## 2016-03-27 DIAGNOSIS — I10 Essential (primary) hypertension: Secondary | ICD-10-CM

## 2016-03-27 DIAGNOSIS — IMO0001 Reserved for inherently not codable concepts without codable children: Secondary | ICD-10-CM

## 2016-03-27 LAB — COMPREHENSIVE METABOLIC PANEL
ALT: 9 U/L (ref 0–35)
AST: 15 U/L (ref 0–37)
Albumin: 3.8 g/dL (ref 3.5–5.2)
Alkaline Phosphatase: 85 U/L (ref 39–117)
BUN: 10 mg/dL (ref 6–23)
CO2: 32 mEq/L (ref 19–32)
Calcium: 9 mg/dL (ref 8.4–10.5)
Chloride: 99 mEq/L (ref 96–112)
Creatinine, Ser: 0.81 mg/dL (ref 0.40–1.20)
GFR: 73.22 mL/min (ref >60.00)
Glucose, Bld: 100 mg/dL — ABNORMAL HIGH (ref 70–99)
Potassium: 3.8 mEq/L (ref 3.5–5.1)
Sodium: 140 mEq/L (ref 135–145)
Total Bilirubin: 0.3 mg/dL (ref 0.2–1.2)
Total Protein: 6.8 g/dL (ref 6.0–8.3)

## 2016-03-27 LAB — VITAMIN D 25 HYDROXY (VIT D DEFICIENCY, FRACTURES): VITD: 14.15 ng/mL — ABNORMAL LOW (ref 30.00–100.00)

## 2016-03-27 LAB — LIPID PANEL
Cholesterol: 129 mg/dL (ref 0–200)
HDL: 56 mg/dL (ref >39.00)
LDL Cholesterol: 47 mg/dL (ref 0–99)
NonHDL: 72.97
Total CHOL/HDL Ratio: 2
Triglycerides: 130 mg/dL (ref 0.0–149.0)
VLDL: 26 mg/dL (ref 0.0–40.0)

## 2016-03-27 LAB — HEMOGLOBIN A1C: Hgb A1c MFr Bld: 7.1 % — ABNORMAL HIGH (ref 4.6–6.5)

## 2016-03-27 LAB — LDL CHOLESTEROL, DIRECT: Direct LDL: 50 mg/dL

## 2016-03-27 MED ORDER — OXYCODONE-ACETAMINOPHEN 10-325 MG PO TABS: 1.0000 | ORAL_TABLET | Freq: Two times a day (BID) | ORAL | 0 refills | Status: DC | PRN

## 2016-03-27 MED ORDER — SERTRALINE HCL 50 MG PO TABS: 75.0000 mg | ORAL_TABLET | Freq: Every day | ORAL | 1 refills | Status: DC

## 2016-03-27 MED ORDER — LORAZEPAM 0.5 MG PO TABS: 0.5000 mg | ORAL_TABLET | Freq: Every day | ORAL | 2 refills | Status: DC

## 2016-03-27 MED ORDER — OXYCODONE-ACETAMINOPHEN 10-325 MG PO TABS: 1.0000 | ORAL_TABLET | Freq: Three times a day (TID) | ORAL | 0 refills | Status: DC | PRN

## 2016-03-27 MED ORDER — OXYCODONE-ACETAMINOPHEN 10-325 MG PO TABS: 1.0000 | ORAL_TABLET | Freq: Every day | ORAL | 0 refills | Status: DC | PRN

## 2016-03-27 NOTE — Progress Notes (Signed)
Subjective:  Patient ID: Bonnie GashElizabeth A Biswell, female    DOB: [birth year]  Age: 76 y.o. 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  HPI Bonnie Hinton presents for follow up on multiple issues including chronic knee pain,  type 2 DM, depression with recent therapy initiation.  daughter concerned about her lack of motivation since knee surgery.  She has discontinued  the oxycontin but is still using the percocet every 4 hours.  Marland Kitchen.   Spent a week in bed after home health was stopped. , but since then has resumed PT with outpatient PT  4th session   zoloft was started at last visit (Nov 27) 50 mg daily.  She is taking a full tablet in the evening     Chronic pain complicated by anxiety.  Lorazepam added by Ortho for Pt sessions ,.  :  #160 percocet 10/325 for refill on dec 11 , has another refill for Jan 11th for #120 .  Still taking one every 4 hours as needed  Cc: stomach is upset every morning after she takes her morning  Pills. Around 5:30 am   . Thinks it is one of her medications. . Sometimes nauseated , but eating makes it feel better.     Outpatient Medications Prior to Visit  Medication Sig Dispense Refill  . acetaminophen (TYLENOL) 325 MG tablet Take 2 tablets (650 mg total) by mouth every 6 (six) hours as needed for mild pain (or Fever >/= 101).    Marland Kitchen. amLODipine (NORVASC) 5 MG tablet Take 1 tablet (5 mg total) by mouth daily. 90 tablet 3  . cycloSPORINE (RESTASIS) 0.05 % ophthalmic emulsion Place 1 drop into both eyes 2 (two) times daily.    Marland Kitchen. dexamethasone (DECADRON) 1 MG tablet Take 1 mg by mouth 2 (two) times daily with a meal. Stop date 02/08/16    . DULoxetine (CYMBALTA) 30 MG capsule Take 30  mg by mouth 2 (two) times daily.    . ergocalciferol (DRISDOL) 50000 units capsule Take 1 capsule (50,000 Units total) by mouth once a week. 12 capsule 0  . ezetimibe (ZETIA) 10 MG tablet TAKE 1 TABLET BY MOUTH EVERY DAY 30 tablet 0  . gabapentin (NEURONTIN) 300 MG capsule Take 1 capsule (300 mg total) by mouth 2 (two) times daily. 60 capsule 0  . levothyroxine (SYNTHROID, LEVOTHROID) 200 MCG tablet Take 1 tablet (200 mcg total) by mouth daily before breakfast. 90 tablet 1  . Liraglutide 18 MG/3ML SOPN Inject 0.6 mg into the skin daily.     . pantoprazole (PROTONIX) 40 MG tablet Take 1 tablet (40 mg total) by mouth daily. 90 tablet 3  . polyethylene glycol (MIRALAX / GLYCOLAX) packet 17grams in 6 oz of water twice a day until bowel movement.  LAXITIVE.  Restart if two days since last bowel movement 14 each 0  . rosuvastatin (CRESTOR) 20 MG tablet TAKE 1 TABLET BY MOUTH DAILY 30 tablet 3  . tolterodine (DETROL LA) 4 MG 24 hr capsule TAKE 1 CAPSULE BY MOUTH TWICE DAILY 60 capsule 3  . docusate sodium (COLACE) 100 MG capsule 1 tab 2 times a day while on narcotics.  STOOL SOFTENER 60 capsule 0  . enoxaparin (LOVENOX) 30 MG/0.3ML injection Inject 0.3 mLs (30 mg  total) into the skin every 12 (twelve) hours. 30 Syringe 0  . furosemide (LASIX) 20 MG tablet TAKE 1 TABLET BY MOUTH DAILY AS NEEDED 30 tablet 3  . LORazepam (ATIVAN) 0.5 MG tablet Take 1 tablet (0.5 mg total) by mouth every 8 (eight) hours as needed for anxiety or sleep. 90 tablet 1  . oxyCODONE (OXYCONTIN) 15 mg 12 hr tablet Take 1 tablet (15 mg total) by mouth every 12 (twelve) hours. May refill on or after Mar 30 2016 60 tablet 0  . oxyCODONE-acetaminophen (PERCOCET) 10-325 MG tablet Take 1 tablet by mouth every 4 (four) hours as needed. May refill on or after March 04 2016 160 tablet 0  . oxyCODONE-acetaminophen (PERCOCET) 10-325 MG tablet Take 1-2 tablets by mouth every 4 (four) hours as needed for pain. May refil on or after Apr 04 2016  120 tablet 0  . sertraline (ZOLOFT) 50 MG tablet Take 1 tablet (50 mg total) by mouth daily. 90 tablet 1   No facility-administered medications prior to visit.     Review of Systems;  Patient denies headache, fevers, malaise, unintentional weight loss, skin rash, eye pain, sinus congestion and sinus pain, sore throat, dysphagia,  hemoptysis , cough, dyspnea, wheezing, chest pain, palpitations, orthopnea, edema, abdominal pain, , melena, diarrhea, constipation, flank pain, dysuria, hematuria, urinary  Frequency, nocturia, numbness, tingling, seizures,  Focal weakness, Loss of consciousness,  Tremor, insomnia, and suicidal ideation.      Objective:  BP 140/70   Pulse 84   Temp 97.9 F (36.6 C) (Oral)   Resp 16   Ht 5\' 5"  (1.651 m)   Wt (!) 311 lb (141.1 kg)   SpO2 96%   BMI 51.75 kg/m   BP Readings from Last 3 Encounters:  03/27/16 140/70  02/19/16 (!) 148/82  01/26/16 132/78    Wt Readings from Last 3 Encounters:  03/27/16 (!) 311 lb (141.1 kg)  02/19/16 (!) 311 lb (141.1 kg)  01/26/16 296 lb 12.8 oz (134.6 kg)    General appearance: obese, alert, cooperative and appears stated age Neck: no adenopathy, no carotid bruit, supple, symmetrical, trachea midline and thyroid not enlarged, symmetric, no tenderness/mass/nodules Back: symmetric, no curvature. ROM normal. No CVA tenderness. Lungs: clear to auscultation bilaterally Heart: regular rate and rhythm, S1, S2 normal, no murmur, click, rub or gallop Abdomen: soft, non-tender; bowel sounds normal; no masses,  no organomegaly Pulses: 2+ and symmetric Skin: Skin color, texture, turgor poor.  Mild nonpitting edema.  Well healed surigical scar right knee.  No rashes or lesions Lymph nodes: Cervical, supraclavicular, and axillary nodes normal.  Lab Results  Component Value Date   HGBA1C 7.1 (H) 03/27/2016   HGBA1C 6.4 12/04/2015   HGBA1C 6.3 08/01/2015    Lab Results  Component Value Date   CREATININE 0.81 03/27/2016    CREATININE 0.7 01/24/2016   CREATININE 0.8 01/10/2016    Lab Results  Component Value Date   WBC 8.9 01/24/2016   HGB 11.9 (A) 01/24/2016   HCT 37 01/24/2016   PLT 214 01/24/2016   GLUCOSE 100 (H) 03/27/2016   CHOL 129 03/27/2016   TRIG 130.0 03/27/2016   HDL 56.00 03/27/2016   LDLDIRECT 50.0 03/27/2016   LDLCALC 47 03/27/2016   ALT 9 03/27/2016   AST 15 03/27/2016   NA 140 03/27/2016   K 3.8 03/27/2016   CL 99 03/27/2016   CREATININE 0.81 03/27/2016   BUN 10 03/27/2016   CO2 32 03/27/2016   TSH 1.53 04/11/2015  INR 1.05 12/21/2015   HGBA1C 7.1 (H) 03/27/2016   MICROALBUR 1.4 08/01/2015    No results found.  Assessment & Plan:   Problem List Items Addressed This Visit    Anxiety, generalized    Continue zoloft, increase to 75 mg daily . 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 We will continue to reduce her monthly opioids .       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 Diet and exercise addressed. Continue victoza, zetia, and resume aspirin . ACE/ARB contraindicated due to hyperkalemia.    Lab Results  Component Value Date   HGBA1C 7.1 (H) 03/27/2016   Lab Results  Component Value Date   MICROALBUR 1.4 08/01/2015        Relevant Orders   Hemoglobin A1c (Completed)   Comprehensive metabolic panel (Completed)   Vitamin D deficiency    Recurrent .  resume drisdol       Relevant Orders   VITAMIN D 25 Hydroxy (Vit-D Deficiency, Fractures) (Completed)    Other Visit Diagnoses    Acquired hypothyroidism       Relevant Orders   TSH      I have discontinued Ms. Roeper's docusate sodium, enoxaparin, oxyCODONE-acetaminophen, oxyCODONE, furosemide, and oxyCODONE-acetaminophen. I  have also changed her oxyCODONE-acetaminophen, oxyCODONE-acetaminophen, LORazepam, and sertraline. Additionally, I am having her maintain her liraglutide, levothyroxine, pantoprazole, amLODipine, ergocalciferol, DULoxetine, acetaminophen, gabapentin, polyethylene glycol, cycloSPORINE, dexamethasone, rosuvastatin, ezetimibe, and tolterodine.  Meds ordered this encounter  Medications  . DISCONTD: oxyCODONE-acetaminophen (PERCOCET) 10-325 MG tablet    Sig: Take 1 tablet by mouth every 8 (eight) hours as needed for pain. May refil on or after Apr 04 2016    Dispense:  90 tablet    Refill:  0    MAY REFILL ON OR AFTER May 05 2016  . DISCONTD: oxyCODONE-acetaminophen (PERCOCET) 10-325 MG tablet    Sig: Take 1 tablet by mouth 2 (two) times daily as needed for pain. May refil on or after Apr 04 2016    Dispense:  60 tablet    Refill:  0    MAY REFILL ON OR AFTER June 02 2016  . oxyCODONE-acetaminophen (PERCOCET) 10-325 MG tablet    Sig: Take 1 tablet by mouth 2 (two) times daily as needed for pain. May refil on or after May 05 2016    Dispense:  60 tablet    Refill:  0    MAY REFILL ON OR AFTER June 02 2016  . oxyCODONE-acetaminophen (PERCOCET) 10-325 MG tablet    Sig: Take 1 tablet by mouth daily as needed. May refill on or after July 03 2016    Dispense:  30 tablet    Refill:  0  . LORazepam (ATIVAN) 0.5 MG tablet    Sig: Take 1 tablet (0.5 mg total) by mouth at bedtime.    Dispense:  30 tablet    Refill:  2  . sertraline (ZOLOFT) 50 MG tablet    Sig: Take 1.5 tablets (75 mg total) by mouth daily.    Dispense:  135 tablet    Refill:  1    Medications Discontinued During This Encounter  Medication Reason  .  oxyCODONE (OXYCONTIN) 15 mg 12 hr tablet No longer needed (for PRN medications)  . oxyCODONE-acetaminophen (PERCOCET) 10-325 MG tablet Reorder  . oxyCODONE-acetaminophen (PERCOCET) 10-325 MG tablet Reorder  . oxyCODONE-acetaminophen (PERCOCET) 10-325 MG tablet Reorder  .  oxyCODONE-acetaminophen (PERCOCET) 10-325 MG tablet Reorder  . LORazepam (ATIVAN) 0.5 MG tablet Reorder  . sertraline (ZOLOFT) 50 MG tablet Reorder  . docusate sodium (COLACE) 100 MG capsule   . enoxaparin (LOVENOX) 30 MG/0.3ML injection   . furosemide (LASIX) 20 MG tablet     Follow-up: Return in about 3 months (around 06/25/2016) for follow up diabetes, pain management .   Sherlene Shams, MD

## 2016-03-27 NOTE — Patient Instructions (Addendum)
Take only your thyroid medication at 5:30 am   Change your Zetia and Crestor to bedtime  All of your other morning medications should be taken with or after breakfast  We are increasing the zoloft to 1.5 tabets (blue pill)  Or 75 mg as a daily dose in the evening   Ok to continue 1 lorazepam daily    Put away the furosemide, ,  It is dehydrating you  Elevate your legs when they become swollen  We are continuing to reduce your daily use of percocet each month :  maximum 3 daily for feburary 2 daily for march 1 daily for April

## 2016-03-28 NOTE — Assessment & Plan Note (Signed)
Recurrent .  resume drisdol

## 2016-03-28 NOTE — Assessment & Plan Note (Addendum)
Slight loss of control due to inactivity . Diet and exercise addressed. Continue victoza, zetia, and resume aspirin . ACE/ARB contraindicated due to hyperkalemia.    Lab Results  Component Value Date   HGBA1C 7.1 (H) 03/27/2016   Lab Results  Component Value Date   MICROALBUR 1.4 08/01/2015

## 2016-03-28 NOTE — Assessment & Plan Note (Signed)
Currently receiving PT. We will continue to reduce her monthly opioids .

## 2016-03-28 NOTE — Assessment & Plan Note (Addendum)
Continue zoloft, increase to 75 mg daily . Reduce lorazepam to once daily

## 2016-03-29 ENCOUNTER — Other Ambulatory Visit (INDEPENDENT_AMBULATORY_CARE_PROVIDER_SITE_OTHER): Payer: Medicare Other

## 2016-03-29 ENCOUNTER — Other Ambulatory Visit: Payer: Self-pay

## 2016-03-29 DIAGNOSIS — E039 Hypothyroidism, unspecified: Secondary | ICD-10-CM

## 2016-03-29 LAB — TSH: TSH: 0.81 u[IU]/mL (ref 0.35–4.50)

## 2016-03-29 MED ORDER — ERGOCALCIFEROL 1.25 MG (50000 UT) PO CAPS: 50000.0000 [IU] | ORAL_CAPSULE | ORAL | 3 refills | Status: DC

## 2016-03-29 NOTE — Progress Notes (Signed)
Patient Bonnie Hinton 50,000 units ordered to pharmacy. Patient stated last time this was ordered a PA was needed. Could we find out if her pharmacy is requesting this Pa.

## 2016-03-29 NOTE — Telephone Encounter (Signed)
Sent in Drisdol [id number] units to pharmacy

## 2016-04-01 ENCOUNTER — Telehealth: Payer: Self-pay

## 2016-04-01 NOTE — Telephone Encounter (Signed)
PA for her Vitamin D megadose Drisdol [id number]. Please advise patient had trouble getting medication last time it was ordered

## 2016-04-03 ENCOUNTER — Telehealth: Payer: Self-pay | Admitting: Internal Medicine

## 2016-04-03 NOTE — Telephone Encounter (Signed)
FYI - Pt daughter called and stated that pt fell out of bed this morning, and called 911 to help her get up aff the floor. Daughter stated that the pt has not increased her Zoloft yet and has not picked up her Vitamin D yet either. Daughter also states that pt just got up.Please advise, thank you!  [facility] @ [phone]  Call pt @ [phone]

## 2016-04-04 ENCOUNTER — Telehealth: Payer: Self-pay | Admitting: Internal Medicine

## 2016-04-04 MED ORDER — ERGOCALCIFEROL 1.25 MG (50000 UT) PO CAPS: 50000.0000 [IU] | ORAL_CAPSULE | ORAL | 3 refills | Status: DC

## 2016-04-04 NOTE — Telephone Encounter (Signed)
Patient notified of PA waiting from insurance

## 2016-04-04 NOTE — Telephone Encounter (Signed)
Nothing to do.

## 2016-04-04 NOTE — Telephone Encounter (Signed)
Sent drisdol to [facility] instead of [facility]. Waiting on PA from insurance before they receive it

## 2016-04-04 NOTE — Telephone Encounter (Signed)
Pt daughter called back and stated that [facility] did not have it either. Any questions call her at [phone].

## 2016-04-04 NOTE — Telephone Encounter (Signed)
Was able to find correct PA was completed on cover my meds.

## 2016-04-04 NOTE — Telephone Encounter (Signed)
Spoke with patient daughter and she stated patient is doing well after her fall. A little sore but was able to go to her Pt. Also told daughter PA for her megadose was done and advised the daughter of patient TSH results. She stated the fall was not due to zoloft since she had not increased her dose.

## 2016-04-05 ENCOUNTER — Telehealth: Payer: Self-pay | Admitting: Internal Medicine

## 2016-04-05 ENCOUNTER — Encounter (HOSPITAL_COMMUNITY): Payer: Self-pay | Admitting: *Deleted

## 2016-04-05 ENCOUNTER — Emergency Department (HOSPITAL_COMMUNITY): Payer: Medicare Other

## 2016-04-05 ENCOUNTER — Emergency Department (HOSPITAL_COMMUNITY)
Admission: EM | Admit: 2016-04-05 | Discharge: 2016-04-06 | Disposition: A | Discharge status: Home / Self Care | Payer: Medicare Other | Attending: Emergency Medicine | Admitting: Emergency Medicine

## 2016-04-05 DIAGNOSIS — Z91040 Latex allergy status: Secondary | ICD-10-CM | POA: Diagnosis not present

## 2016-04-05 DIAGNOSIS — Z96653 Presence of artificial knee joint, bilateral: Secondary | ICD-10-CM | POA: Diagnosis not present

## 2016-04-05 DIAGNOSIS — W07XXXA Fall from chair, initial encounter: Secondary | ICD-10-CM | POA: Insufficient documentation

## 2016-04-05 DIAGNOSIS — Z7984 Long term (current) use of oral hypoglycemic drugs: Secondary | ICD-10-CM | POA: Diagnosis not present

## 2016-04-05 DIAGNOSIS — G8929 Other chronic pain: Secondary | ICD-10-CM | POA: Diagnosis present

## 2016-04-05 DIAGNOSIS — E039 Hypothyroidism, unspecified: Secondary | ICD-10-CM | POA: Diagnosis not present

## 2016-04-05 DIAGNOSIS — R748 Abnormal levels of other serum enzymes: Secondary | ICD-10-CM

## 2016-04-05 DIAGNOSIS — I129 Hypertensive chronic kidney disease with stage 1 through stage 4 chronic kidney disease, or unspecified chronic kidney disease: Secondary | ICD-10-CM | POA: Insufficient documentation

## 2016-04-05 DIAGNOSIS — E1122 Type 2 diabetes mellitus with diabetic chronic kidney disease: Secondary | ICD-10-CM | POA: Insufficient documentation

## 2016-04-05 DIAGNOSIS — N3001 Acute cystitis with hematuria: Secondary | ICD-10-CM

## 2016-04-05 DIAGNOSIS — Y939 Activity, unspecified: Secondary | ICD-10-CM | POA: Insufficient documentation

## 2016-04-05 DIAGNOSIS — Y929 Unspecified place or not applicable: Secondary | ICD-10-CM | POA: Insufficient documentation

## 2016-04-05 DIAGNOSIS — Z79899 Other long term (current) drug therapy: Secondary | ICD-10-CM | POA: Diagnosis not present

## 2016-04-05 DIAGNOSIS — N189 Chronic kidney disease, unspecified: Secondary | ICD-10-CM | POA: Diagnosis not present

## 2016-04-05 DIAGNOSIS — R945 Abnormal results of liver function studies: Secondary | ICD-10-CM | POA: Diagnosis not present

## 2016-04-05 DIAGNOSIS — Z96643 Presence of artificial hip joint, bilateral: Secondary | ICD-10-CM | POA: Diagnosis not present

## 2016-04-05 DIAGNOSIS — M545 Low back pain: Secondary | ICD-10-CM | POA: Insufficient documentation

## 2016-04-05 DIAGNOSIS — Y999 Unspecified external cause status: Secondary | ICD-10-CM | POA: Insufficient documentation

## 2016-04-05 LAB — CBC WITH DIFFERENTIAL/PLATELET
Basophils Absolute: 0 10*3/uL (ref 0.0–0.1)
Basophils Relative: 0 %
Eosinophils Absolute: 0 10*3/uL (ref 0.0–0.7)
Eosinophils Relative: 0 %
HCT: 38 % (ref 36.0–46.0)
Hemoglobin: 12.6 g/dL (ref 12.0–15.0)
Lymphocytes Relative: 14 %
Lymphs Abs: 1.2 10*3/uL (ref 0.7–4.0)
MCH: 28.9 pg (ref 26.0–34.0)
MCHC: 33.2 g/dL (ref 30.0–36.0)
MCV: 87.2 fL (ref 78.0–100.0)
Monocytes Absolute: 0.8 10*3/uL (ref 0.1–1.0)
Monocytes Relative: 9 %
Neutro Abs: 6.5 10*3/uL (ref 1.7–7.7)
Neutrophils Relative %: 77 %
Platelets: 346 10*3/uL (ref 150–400)
RBC: 4.36 MIL/uL (ref 3.87–5.11)
RDW: 13.3 % (ref 11.5–15.5)
WBC: 8.5 10*3/uL (ref 4.0–10.5)

## 2016-04-05 LAB — COMPREHENSIVE METABOLIC PANEL
ALT: 122 U/L — ABNORMAL HIGH (ref 14–54)
AST: 247 U/L — ABNORMAL HIGH (ref 15–41)
Albumin: 3.3 g/dL — ABNORMAL LOW (ref 3.5–5.0)
Alkaline Phosphatase: 154 U/L — ABNORMAL HIGH (ref 38–126)
Anion gap: 9 (ref 5–15)
BUN: 9 mg/dL (ref 6–20)
CO2: 26 mmol/L (ref 22–32)
Calcium: 8.8 mg/dL — ABNORMAL LOW (ref 8.9–10.3)
Chloride: 104 mmol/L (ref 101–111)
Creatinine, Ser: 0.72 mg/dL (ref 0.44–1.00)
GFR calc Af Amer: >60 mL/min (ref >60)
GFR calc non Af Amer: >60 mL/min (ref >60)
Glucose, Bld: 138 mg/dL — ABNORMAL HIGH (ref 65–99)
Potassium: 3.5 mmol/L (ref 3.5–5.1)
Sodium: 139 mmol/L (ref 135–145)
Total Bilirubin: 0.7 mg/dL (ref 0.3–1.2)
Total Protein: 6.7 g/dL (ref 6.5–8.1)

## 2016-04-05 LAB — URINALYSIS, ROUTINE W REFLEX MICROSCOPIC
Bilirubin Urine: NEGATIVE
Glucose, UA: NEGATIVE mg/dL
Ketones, ur: 20 mg/dL — AB
Nitrite: POSITIVE — AB
Protein, ur: 30 mg/dL — AB
Specific Gravity, Urine: 1.014 (ref 1.005–1.030)
pH: 5 (ref 5.0–8.0)

## 2016-04-05 MED ORDER — CEPHALEXIN 500 MG PO CAPS
500.0000 mg | ORAL_CAPSULE | Freq: Once | ORAL | Status: AC
  Administered 2016-04-06: 500 mg via ORAL
  Filled 2016-04-05: qty 1

## 2016-04-05 MED ORDER — SODIUM CHLORIDE 0.9 % IV BOLUS (SEPSIS)
500.0000 mL | Freq: Once | INTRAVENOUS | Status: AC
Start: 2016-04-05 — End: 2016-04-06
  Administered 2016-04-05: 500 mL via INTRAVENOUS

## 2016-04-05 NOTE — Telephone Encounter (Signed)
Pt daughter called and stated that she has fallen twice since yesterday. EMS is at the house now trying to get her up, Daughter feels as though she is not eating. Daughter is just not sure what to do. Please advise, thank you!  [facility] @ [phone]

## 2016-04-05 NOTE — ED Provider Notes (Signed)
[facility]-EMERGENCY DEPT Provider Note   CSN: [id number] Arrival date & time: 04/05/16  1940 By signing my name below, I, Levon Hedger, attest that this documentation has been prepared under the direction and in the presence of non-physician practitioner, Shanna Cisco, PA-C. Electronically Signed: Levon Hedger, Scribe. 04/05/2016. 9:37 PM.   History   Chief Complaint Chief Complaint  Patient presents with  . Fall    x 3 today   HPI Bonnie Hinton is a 76 y.o. female with a history of chronic back pain, arthritis, DM 2 and neuropathy who presents to the Emergency Department for evaluation after three unwitnessed falls today. Pt states she slipped out of her chair; per pt's daughter, EMS was called each time and pt refused transport. [name] states pt has not taken any medication today. Daughter believes that patient took both yesterday and today's medication yesterday by mistake. Patient's only complaint is low back pain, however patient states that she has chronic low back pain 2/2 stenosis and SI arthritis and no acute change from fall.  Daughter reports associated fatigue throughout the week. She was seen at her PCP on 03/27/16 for evaluation because daughter is concerned about pt's lack of motivation since her knee surgery and had her medications adjusted at that time. Pt denies LOC, head injury, neck pain, abdominal pain, saddle anesthesia, numbness or tingling to BLE, dysuria, urinary or bowel incontinence, fever or any other associated symptoms.   The history is provided by the patient and a relative (pt's daughter). No language interpreter was used.   Past Medical History:  Diagnosis Date  . Anxiety   . Arthritis   . Chronic back pain    stenosis and arthritis  . Complication of anesthesia    anxious when she wakes up  . GERD (gastroesophageal reflux disease)    takes Pantoprazole daily  . Glaucoma    dry angle  . History of bronchitis    30+ yrs ago  . History of MRSA  infection 2004   several yrs ago  . Hyperlipidemia    takes Zetia and Crestor daily  . Hypertension    take Amlodipine daily  . Hypothyroidism    takes Synthroid daily  . Joint pain   . Joint swelling   . Neuropathy (HCC)    takes Gabapentin daily  . Peripheral edema    takes Furosemide daily as needed  . Pneumonia    hx of-30 + yrs ago  . Pre-diabetes    takes Victoza daily  . Primary localized osteoarthritis of right knee   . Subdural hematoma (HCC) 8 yrs ago   hx of  . Urinary frequency   . Urinary urgency     Patient Active Problem List   Diagnosis Date Noted  . Anxiety, generalized 02/20/2016  . Leukocytosis 01/11/2016  . Arthritis   . Thyroid disease   . Chronic kidney disease   . Subdural hematoma (HCC)   . Status post bilateral hip replacements   . Status post left knee replacement   . Chronic low back pain with right-sided sciatica   . Essential hypertension, benign 07/25/2015  . Preoperative clearance 07/25/2015  . Hyperlipidemia 07/25/2015  . Urinary urgency 04/26/2015  . Xerosis of skin 04/26/2015  . Hyperkalemia 04/26/2015  . 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 Morbid obesity (HCC) 01/24/2015  . Vitamin D deficiency 01/24/2015  . 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 ABDOMINAL HYSTERECTOMY    . COLONOSCOPY    . RADIOLOGY WITH ANESTHESIA N/A 08/22/2015   Procedure: MRI OF LUMBAR SPINE   (RADIOLOGY WITH ANESTHESIA);  Surgeon: Medication Radiologist, MD;  Location: [facility] OR;  Service: Radiology;  Laterality: N/A;  . TOTAL HIP ARTHROPLASTY Right   . TOTAL HIP ARTHROPLASTY Left   . TOTAL KNEE ARTHROPLASTY Left   . TOTAL KNEE ARTHROPLASTY Right 01/01/2016   Procedure: TOTAL KNEE ARTHROPLASTY;  Surgeon: Salvatore Marvel, MD;  Location: [facility] OR;  Service: Orthopedics;  Laterality: Right;    OB History    No data available      Home Medications     Prior to Admission medications   Medication Sig Start Date End Date Taking? Authorizing Provider  acetaminophen (TYLENOL) 325 MG tablet Take 2 tablets (650 mg total) by mouth every 6 (six) hours as needed for mild pain (or Fever >/= 101). 01/04/16  Yes Kirstin Shepperson, PA-C  amLODipine (NORVASC) 5 MG tablet Take 1 tablet (5 mg total) by mouth daily. 12/04/15  Yes Sherlene Shams, MD  cycloSPORINE (RESTASIS) 0.05 % ophthalmic emulsion Place 1 drop into both eyes 2 (two) times daily.   Yes Historical Provider, MD  dexamethasone (DECADRON) 1 MG tablet Take 1 mg by mouth 2 (two) times daily with a meal. Stop date 02/08/16   Yes Historical Provider, MD  DULoxetine (CYMBALTA) 30 MG capsule Take 30 mg by mouth 2 (two) times daily. 11/27/15  Yes Historical Provider, MD  enoxaparin (LOVENOX) 30 MG/0.3ML injection Inject 30 mg into the skin 2 (two) times daily. 01/29/16  Yes Historical Provider, MD  ergocalciferol (DRISDOL) 50000 units capsule Take 1 capsule (50,000 Units total) by mouth once a week. 04/04/16  Yes Sherlene Shams, MD  estradiol (ESTRACE) 1 MG tablet Take 1 mg by mouth daily. 12/30/15  Yes Historical Provider, MD  ezetimibe (ZETIA) 10 MG tablet TAKE 1 TABLET BY MOUTH EVERY DAY 03/14/16  Yes Sherlene Shams, MD  gabapentin (NEURONTIN) 300 MG capsule Take 1 capsule (300 mg total) by mouth 2 (two) times daily. 01/04/16  Yes Kirstin Shepperson, PA-C  levothyroxine (SYNTHROID, LEVOTHROID) 200 MCG tablet Take 1 tablet (200 mcg total) by mouth daily before breakfast. 08/01/15  Yes Sherlene Shams, MD  Liraglutide 18 MG/3ML SOPN Inject 0.6 mg into the skin daily.    Yes Historical Provider, MD  LORazepam (ATIVAN) 0.5 MG tablet Take 1 tablet (0.5 mg total) by mouth at bedtime. 03/27/16  Yes Sherlene Shams, MD  LUMIGAN 0.01 % SOLN Place 1-2 drops into both eyes at bedtime. 01/30/16  Yes Historical Provider, MD  oxyCODONE-acetaminophen (PERCOCET) 10-325 MG tablet Take 1 tablet by mouth 2 (two) times daily as  needed for pain. May refil on or after May 05 2016 03/27/16  Yes Sherlene Shams, MD  pantoprazole (PROTONIX) 40 MG tablet Take 1 tablet (40 mg total) by mouth daily. 12/04/15  Yes Sherlene Shams, MD  rosuvastatin (CRESTOR) 20 MG tablet TAKE 1 TABLET BY MOUTH DAILY 03/14/16  Yes Sherlene Shams, MD  sertraline (ZOLOFT) 50 MG tablet Take 1.5 tablets (75 mg total) by mouth daily. 03/27/16  Yes Sherlene Shams, MD  tolterodine (DETROL LA) 4 MG 24 hr capsule TAKE 1 CAPSULE BY MOUTH TWICE DAILY 03/14/16  Yes Sherlene Shams, MD  cephALEXin (KEFLEX) 500 MG capsule Take 1 capsule (500 mg total) by mouth 4 (four) times daily. 04/06/16   Chase Picket Oshae Simmering,  PA-C  oxyCODONE-acetaminophen (PERCOCET) 10-325 MG tablet Take 1 tablet by mouth daily as needed. May refill on or after July 03 2016 Patient taking differently: Take 1 tablet by mouth daily as needed. Starting March 2018; May refill on or after July 03 2016 03/27/16   Sherlene Shamseresa L Tullo, MD  polyethylene glycol [facility](MIRALAX / GLYCOLAX) packet 17grams in 6 oz of water twice a day until bowel movement.  LAXITIVE.  Restart if two days since last bowel movement Patient not taking: Reported on 04/05/2016 01/04/16   Julien GirtKirstin Shepperson, PA-C    Family History Family History  Problem Relation Age of Onset  . Cancer Father   . Cancer Brother   . Diabetes Maternal Aunt     Social History Social History  Substance Use Topics  . Smoking status: Never Smoker  . Smokeless tobacco: Never Used  . Alcohol use No    Allergies   Erythromycin; Iodinated diagnostic agents; Fentanyl; Latex; and Lisinopril   Review of Systems Review of Systems  Constitutional: Positive for fatigue. Negative for chills and fever.  HENT: Negative for congestion.   Eyes: Negative for visual disturbance.  Respiratory: Negative for cough and shortness of breath.   Cardiovascular: Negative.   Gastrointestinal: Negative for abdominal pain, nausea and vomiting.  Genitourinary: Negative for  dysuria.  Musculoskeletal: Positive for back pain. Negative for neck pain.  Skin: Negative for wound.  Neurological: Negative for syncope and headaches.     Physical Exam Updated Vital Signs BP (!) 148/50 (BP Location: Right Arm)   Pulse 94   Temp 98.1 F (36.7 C) (Oral)   Resp 18   Ht 5\' 7"  (1.702 m)   Wt (!) 176.9 kg   SpO2 96%   BMI 61.08 kg/m   Physical Exam  Constitutional: She is oriented to person, place, and time. She appears well-developed and well-nourished. No distress.  HENT:  Head: Normocephalic and atraumatic.  Cardiovascular: Normal rate, regular rhythm and normal heart sounds.   No murmur heard. Pulmonary/Chest: Effort normal and breath sounds normal. No respiratory distress.  Abdominal: Soft. She exhibits no distension. There is no tenderness.  Musculoskeletal: She exhibits edema (1+ pedal).  No C or T midline or paraspinal tenderness. TTP to L spine which is chronic per patient and daughter at bedside. Denies acute change in low back pain. Full ROM. Straight leg raises negative bilaterally for radicular symptoms. 5/5 muscle strength in all four extremities.   Neurological: She is alert and oriented to person, place, and time. No cranial nerve deficit.  Skin: Skin is warm and dry.  Nursing note and vitals reviewed.   ED Treatments / Results  DIAGNOSTIC STUDIES:  Oxygen Saturation is 97% on RA, normal by my interpretation.    COORDINATION OF CARE:  9:39 PM Discussed treatment plan with pt at bedside and pt agreed to plan.  Labs (all labs ordered are listed, but only abnormal results are displayed) Labs Reviewed  COMPREHENSIVE METABOLIC PANEL - Abnormal; Notable for the following:       Result Value   Glucose, Bld 138 (*)    Calcium 8.8 (*)    Albumin 3.3 (*)    AST 247 (*)    ALT 122 (*)    Alkaline Phosphatase 154 (*)    All other components within normal limits  URINALYSIS, ROUTINE W REFLEX MICROSCOPIC - Abnormal; Notable for the following:     APPearance HAZY (*)    Hgb urine dipstick SMALL (*)    Ketones, ur 20 (*)  Protein, ur 30 (*)    Nitrite POSITIVE (*)    Leukocytes, UA TRACE (*)    Bacteria, UA MANY (*)    Squamous Epithelial / LPF 0-5 (*)    All other components within normal limits  URINE CULTURE  CBC WITH DIFFERENTIAL/PLATELET    EKG  EKG Interpretation None       Radiology Ct Head Wo Contrast  Result Date: 04/05/2016 CLINICAL DATA:  Larey Seat out of the head, injury EXAM: CT HEAD WITHOUT CONTRAST TECHNIQUE: Contiguous axial images were obtained from the base of the skull through the vertex without intravenous contrast. COMPARISON:  None. FINDINGS: Brain: No acute territorial infarction or intracranial hemorrhage is visualized. There is no focal mass, mass effect or midline shift. 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 Ventricles nonenlarged. Mild to moderate atrophy. Vascular: No hyperdense vessels. Minimal carotid artery calcification Skull: Left inferior mastoid sclerosis.  No skull fracture Sinuses/Orbits: No acute finding. Other: None IMPRESSION: 1. No CT evidence for acute intracranial abnormality 2. Old appearing encephalomalacia involving the left inferior frontal lobe and right temporal lobe Electronically Signed   By: Jasmine Pang M.D.   On: 04/05/2016 23:38   US Abdomen Limited Ruq  Result Date: 04/06/2016 CLINICAL DATA:  Elevated liver function studies. History of chronic kidney disease, diabetes, hypertension, hyperlipidemia, gastroesophageal reflux disease, and obesity. EXAM: US ABDOMEN LIMITED - RIGHT UPPER QUADRANT COMPARISON:  None. FINDINGS: Gallbladder: Small stones and sludge layering in the gallbladder, largest measuring 6 mm. Borderline gallbladder wall thickening. Murphy's sign is negative. Common bile duct: Diameter: 5.3 mm, normal Liver: Poor penetration possibly due to body habitus. Increased liver echotexture is suggestive of diffuse fatty  infiltration. No focal lesions identified. IMPRESSION: Cholelithiasis with mild gallbladder wall thickening. Murphy's sign is negative. Changes are nonspecific but could indicate acute cholecystitis in the appropriate clinical setting. Diffuse fatty infiltration of the liver. Electronically Signed   By: Burman Nieves M.D.   On: 04/06/2016 01:59    Procedures Procedures (including critical care time)  Medications Ordered in ED Medications  sodium chloride 0.9 % bolus 500 mL (0 mLs Intravenous Stopped 04/06/16 0209)  cephALEXin (KEFLEX) capsule 500 mg (500 mg Oral Given 04/06/16 0043)     Initial Impression / Assessment and Plan / ED Course  I have reviewed the triage vital signs and the nursing notes.  Pertinent labs & imaging results that were available during my care of the patient were reviewed by me and considered in my medical decision making (see chart for details).  Clinical Course    Bonnie Hinton is a 76 y.o. female who presents to ED for evaluation after having three unwitnessed falls from her wheelchair today. Patient states that she has been much more fatigued than usual, but otherwise no complaints. She states that she just slipped out of her chair. On exam, patient does not appear to have sustained any injury from the fall. HR 90's-100's. Will obtain basic labs, UA, and fluid bolus then reassess.   UA  nitrate positive with 6-30 white cells. Will send for culture treat with Keflex.   CMP shows elevated AST, ALT and alkaline phosphatase: Lab work performed 10 days ago with normal liver function tests. Ultrasound was performed which shows cholelithiasis with mild gallbladder wall thickening. She does have nonspecific changes which radiology indicated that the from cholecystitis in the appropriate clinical setting. On exam, she has no focal area of abdominal tenderness. She is afebrile. I do  not believe acute cholecystitis - discussed with attending, Dr. Rhunette Croft who also  performed an abdominal examination and agrees that history and exam is not c/w acute cholecystitis. General surgery follow up as outpatient given. Evaluation does not show pathology that would require ongoing emergent intervention or inpatient treatment. Patient is hemodynamically stable and mentating appropriately. PCP follow up strongly encouraged. Return precautions discussed and all questions answered.    Patient seen by and discussed with Dr. Rhunette Croft who agrees with treatment plan.    Final Clinical Impressions(s) / ED Diagnoses   Final diagnoses:  Elevated liver enzymes  Acute cystitis with hematuria    New Prescriptions New Prescriptions   CEPHALEXIN (KEFLEX) 500 MG CAPSULE    Take 1 capsule (500 mg total) by mouth 4 (four) times daily.   I personally performed the services described in this documentation, which was scribed in my presence. The recorded information has been reviewed and is accurate.    [facility] Azia Toutant, PA-C 04/06/16 9147    Derwood Kaplan, MD 04/08/16 1623

## 2016-04-05 NOTE — Telephone Encounter (Signed)
Spoke with Bonnie Hinton  daughtCorrie Dandyer DPR patient fell , twice today, EMS told her that she was really weak, suggested to her that she get evaluated or go into home or get caregiver to stay with her. Patient refused to go with EMS.  Per daughter she slides out of bed.   She fell once on Wednesday in home.   Daughter is concerned due she is not eating and may be depressed but patient will deny in office.  Went to outpatient PT yesterday.   Daughter [name] going to email  [facility] to see about getting  help for patient in home during the day 9:00am-6:00pm.

## 2016-04-05 NOTE — ED Notes (Signed)
Bed: WA02 Expected date:  Expected time:  Means of arrival:  Comments: 75yo F fall

## 2016-04-05 NOTE — ED Notes (Signed)
Patient transported to CT 

## 2016-04-05 NOTE — ED Triage Notes (Signed)
Per [facility], Pt has fallen out of w/c or bed x 3 today, has called EMS each time & refused transport.  Pt denies LOC, only complaint is chronic back pain.

## 2016-04-06 ENCOUNTER — Emergency Department (HOSPITAL_COMMUNITY): Payer: Medicare Other

## 2016-04-06 MED ORDER — CEPHALEXIN 500 MG PO CAPS: 500.0000 mg | ORAL_CAPSULE | Freq: Four times a day (QID) | ORAL | 0 refills | Status: DC

## 2016-04-06 NOTE — Discharge Instructions (Signed)
It was my pleasure taking care of you today!  Please take all of your antibiotics until finished! You will need to call the general surgery clinic in the morning to schedule a follow up appointment for further discussion of your gallstones and elevated liver enzymes. I would also like you to follow up with your primary care provider next week to ensure that your urinary tract infection has improved.  Return to ER for fevers, back pain, new or worsening symptoms, any additional concerns.

## 2016-04-06 NOTE — ED Notes (Signed)
Pt placed on bedpan

## 2016-04-06 NOTE — ED Notes (Signed)
Called daughter as requested.  She stated "I'm sorry, I went to sleep.  The PA said she would arrange transport."

## 2016-04-06 NOTE — ED Notes (Signed)
US in room 

## 2016-04-06 NOTE — ED Notes (Signed)
Pt cleaned of a large amount of soft brown stool.

## 2016-04-06 NOTE — ED Notes (Signed)
Awaiting pt's daughter's arrival to see if she will be transporting pt home.

## 2016-04-06 NOTE — ED Notes (Signed)
Pt informed [facility] has been called for transport home.

## 2016-04-06 NOTE — ED Notes (Signed)
Pt cleaned of incontinent episode.  Linens changed. 

## 2016-04-06 NOTE — ED Notes (Addendum)
Pt does not ambulate, uses w/c, PA-C Jamie informed.

## 2016-04-06 NOTE — ED Notes (Signed)
Pt cleaned of incontinent episode.  Linens changed.

## 2016-04-08 ENCOUNTER — Telehealth: Payer: Self-pay | Admitting: Internal Medicine

## 2016-04-08 LAB — URINE CULTURE: Culture: >=100000 — AB

## 2016-04-08 NOTE — Telephone Encounter (Signed)
Pt daughter called and stated that pt had fell 3 times on Friday and went to the ED. She was diagnosed with a gallstone and a UTI. PT needs an appt ASAP, please advise, thank you!  Call pt @ [phone]

## 2016-04-08 NOTE — Telephone Encounter (Signed)
Scheduled patient Bonnie Hinton for tomorrow at 2.patient needs referral to GI.

## 2016-04-08 NOTE — Telephone Encounter (Signed)
PA for Dridol denied due to cost for patient is only 6.43 at pharmacy patient daughter notified and agreed to pick up medication.

## 2016-04-09 ENCOUNTER — Encounter: Payer: Self-pay | Admitting: Family

## 2016-04-09 ENCOUNTER — Ambulatory Visit (INDEPENDENT_AMBULATORY_CARE_PROVIDER_SITE_OTHER): Payer: Medicare Other | Admitting: Family

## 2016-04-09 ENCOUNTER — Telehealth (HOSPITAL_BASED_OUTPATIENT_CLINIC_OR_DEPARTMENT_OTHER): Payer: Self-pay | Admitting: *Deleted

## 2016-04-09 VITALS — BP 146/78 | HR 95 | Temp 97.9°F | Ht 67.0 in | Wt 307.0 lb

## 2016-04-09 DIAGNOSIS — R748 Abnormal levels of other serum enzymes: Secondary | ICD-10-CM | POA: Diagnosis not present

## 2016-04-09 DIAGNOSIS — K760 Fatty (change of) liver, not elsewhere classified: Secondary | ICD-10-CM

## 2016-04-09 DIAGNOSIS — E079 Disorder of thyroid, unspecified: Secondary | ICD-10-CM | POA: Diagnosis not present

## 2016-04-09 LAB — TSH: TSH: 0.07 u[IU]/mL — ABNORMAL LOW (ref 0.35–4.50)

## 2016-04-09 LAB — CBC WITH DIFFERENTIAL/PLATELET
Basophils Absolute: 0 10*3/uL (ref 0.0–0.1)
Basophils Relative: 0.6 % (ref 0.0–3.0)
Eosinophils Absolute: 0.2 10*3/uL (ref 0.0–0.7)
Eosinophils Relative: 2 % (ref 0.0–5.0)
HCT: 41.6 % (ref 36.0–46.0)
Hemoglobin: 13.7 g/dL (ref 12.0–15.0)
Lymphocytes Relative: 22 % (ref 12.0–46.0)
Lymphs Abs: 1.9 10*3/uL (ref 0.7–4.0)
MCHC: 33 g/dL (ref 30.0–36.0)
MCV: 88.8 fl (ref 78.0–100.0)
Monocytes Absolute: 0.6 10*3/uL (ref 0.1–1.0)
Monocytes Relative: 7.5 % (ref 3.0–12.0)
Neutro Abs: 5.7 10*3/uL (ref 1.4–7.7)
Neutrophils Relative %: 67.9 % (ref 43.0–77.0)
Platelets: 349 10*3/uL (ref 150.0–400.0)
RBC: 4.69 Mil/uL (ref 3.87–5.11)
RDW: 14.5 % (ref 11.5–15.5)
WBC: 8.4 10*3/uL (ref 4.0–10.5)

## 2016-04-09 LAB — COMPREHENSIVE METABOLIC PANEL
ALT: 41 U/L — ABNORMAL HIGH (ref 0–35)
AST: 26 U/L (ref 0–37)
Albumin: 3.5 g/dL (ref 3.5–5.2)
Alkaline Phosphatase: 142 U/L — ABNORMAL HIGH (ref 39–117)
BUN: 9 mg/dL (ref 6–23)
CO2: 25 mEq/L (ref 19–32)
Calcium: 9.1 mg/dL (ref 8.4–10.5)
Chloride: 101 mEq/L (ref 96–112)
Creatinine, Ser: 0.71 mg/dL (ref 0.40–1.20)
GFR: 85.24 mL/min (ref >60.00)
Glucose, Bld: 114 mg/dL — ABNORMAL HIGH (ref 70–99)
Potassium: 4 mEq/L (ref 3.5–5.1)
Sodium: 136 mEq/L (ref 135–145)
Total Bilirubin: 0.3 mg/dL (ref 0.2–1.2)
Total Protein: 6.6 g/dL (ref 6.0–8.3)

## 2016-04-09 NOTE — Progress Notes (Signed)
Subjective:    Patient ID: Bonnie Hinton, female    DOB: Mar 28, 1940, 76 y.o.   MRN: [id number]  CC: Bonnie Hinton is a 76 y.o. female who presents today for follow up.   HPI: Multiple falls last week and went to ED , 3 times once in one day 4 days ago.  Accompanied by daughter. One of the falls, 'slide out of bed,' and the other 2 times 'slid out of wheelchair.' No upper or lower body strength per daughter and seems to have lost motivation since TKR.   Right TKR 12/2015. Sitting most of the day. Using wheelchair at home. Has done 6 weeks PT at [facility] , in which she could transfer well and walk well.  Had Home Health PT which she would help her walking. Now currently during 3 weeks outpatient with improvement per daughter. Now seeing PT 2x per week and 'less accountable' per daughter and strength diminishing.   UTI resolved. Even after one dose of Keflex, daughter states strength improved.   Depression and anxiety- on zoloft 50mg . Didn't start 75 mg which was advised at last visit. Working on Education officer, environmental ( originally started before therapy).   Gall stones noted in ED. No RUQ pain. No pain with meals. No abdominal pain, N, V.   Of note, Had been taking statin and Synthroid BID accidentally. No palpitations.   1/3 increased zoloft to 75mg , continue PT with TKR right, continue victoza, zetia, and ASA.  1/12 ED Started kelfex for UTI ( UC sensitive to keflex). CT Head negative for acute findings Elevated AST, ALT; US shows cholelithiasis, fatty liver and no focal infiltrate, non acute- advised to f/u as outpatient.    TSH WNL. A1c slightly increased   HISTORY:  Past Medical History:  Diagnosis Date  . Anxiety   . Arthritis   . Chronic back pain    stenosis and arthritis  . Complication of anesthesia    anxious when she wakes up  . GERD (gastroesophageal reflux disease)    takes Pantoprazole daily  . Glaucoma    dry angle  . History of bronchitis    30+ yrs  ago  . History of MRSA infection 2004   several yrs ago  . Hyperlipidemia    takes Zetia and Crestor daily  . Hypertension    take Amlodipine daily  . Hypothyroidism    takes Synthroid daily  . Joint pain   . Joint swelling   . Neuropathy (HCC)    takes Gabapentin daily  . Peripheral edema    takes Furosemide daily as needed  . Pneumonia    hx of-30 + yrs ago  . Pre-diabetes    takes Victoza daily  . Primary localized osteoarthritis of right knee   . Subdural hematoma (HCC) 8 yrs ago   hx of  . Urinary frequency   . Urinary urgency    Past Surgical History:  Procedure Laterality Date  . ABDOMINAL HYSTERECTOMY    . COLONOSCOPY    . RADIOLOGY WITH ANESTHESIA N/A 08/22/2015   Procedure: MRI OF LUMBAR SPINE   (RADIOLOGY WITH ANESTHESIA);  Surgeon: Medication Radiologist, MD;  Location: [facility] OR;  Service: Radiology;  Laterality: N/A;  . TOTAL HIP ARTHROPLASTY Right   . TOTAL HIP ARTHROPLASTY Left   . TOTAL KNEE ARTHROPLASTY Left   . TOTAL KNEE ARTHROPLASTY Right 01/01/2016   Procedure: TOTAL KNEE ARTHROPLASTY;  Surgeon: Salvatore Marvel, MD;  Location: [facility] OR;  Service: Orthopedics;  Laterality: Right;  Family History  Problem Relation Age of Onset  . Cancer Father   . Cancer Brother   . Diabetes Maternal Aunt     Allergies: Erythromycin; Iodinated diagnostic agents; Fentanyl; Latex; and Lisinopril Current Outpatient Prescriptions on File Prior to Visit  Medication Sig Dispense Refill  . acetaminophen (TYLENOL) 325 MG tablet Take 2 tablets (650 mg total) by mouth every 6 (six) hours as needed for mild pain (or Fever >/= 101).    Marland Kitchen. amLODipine (NORVASC) 5 MG tablet Take 1 tablet (5 mg total) by mouth daily. 90 tablet 3  . cephALEXin (KEFLEX) 500 MG capsule Take 1 capsule (500 mg total) by mouth 4 (four) times daily. 39 capsule 0  . cycloSPORINE (RESTASIS) 0.05 % ophthalmic emulsion Place 1 drop into both eyes 2 (two) times daily.    Marland Kitchen. dexamethasone (DECADRON) 1 MG tablet Take 1  mg by mouth 2 (two) times daily with a meal. Stop date 02/08/16    . DULoxetine (CYMBALTA) 30 MG capsule Take 30 mg by mouth 2 (two) times daily.    . ergocalciferol (DRISDOL) 50000 units capsule Take 1 capsule (50,000 Units total) by mouth once a week. 4 capsule 3  . estradiol (ESTRACE) 1 MG tablet Take 1 mg by mouth daily.    Marland Kitchen. ezetimibe (ZETIA) 10 MG tablet TAKE 1 TABLET BY MOUTH EVERY DAY 30 tablet 0  . gabapentin (NEURONTIN) 300 MG capsule Take 1 capsule (300 mg total) by mouth 2 (two) times daily. 60 capsule 0  . levothyroxine (SYNTHROID, LEVOTHROID) 200 MCG tablet Take 1 tablet (200 mcg total) by mouth daily before breakfast. 90 tablet 1  . Liraglutide 18 MG/3ML SOPN Inject 0.6 mg into the skin daily.     Marland Kitchen. LORazepam (ATIVAN) 0.5 MG tablet Take 1 tablet (0.5 mg total) by mouth at bedtime. 30 tablet 2  . LUMIGAN 0.01 % SOLN Place 1-2 drops into both eyes at bedtime.    Marland Kitchen. oxyCODONE-acetaminophen (PERCOCET) 10-325 MG tablet Take 1 tablet by mouth 2 (two) times daily as needed for pain. May refil on or after May 05 2016 60 tablet 0  . oxyCODONE-acetaminophen (PERCOCET) 10-325 MG tablet Take 1 tablet by mouth daily as needed. May refill on or after July 03 2016 (Patient taking differently: Take 1 tablet by mouth daily as needed. Starting March 2018; May refill on or after July 03 2016) 30 tablet 0  . pantoprazole (PROTONIX) 40 MG tablet Take 1 tablet (40 mg total) by mouth daily. 90 tablet 3  . polyethylene glycol (MIRALAX / GLYCOLAX) packet 17grams in 6 oz of water twice a day until bowel movement.  LAXITIVE.  Restart if two days since last bowel movement 14 each 0  . rosuvastatin (CRESTOR) 20 MG tablet TAKE 1 TABLET BY MOUTH DAILY 30 tablet 3  . sertraline (ZOLOFT) 50 MG tablet Take 1.5 tablets (75 mg total) by mouth daily. 135 tablet 1  . tolterodine (DETROL LA) 4 MG 24 hr capsule TAKE 1 CAPSULE BY MOUTH TWICE DAILY 60 capsule 3   No current facility-administered medications on file prior  to visit.     Social History  Substance Use Topics  . Smoking status: Never Smoker  . Smokeless tobacco: Never Used  . Alcohol use No    Review of Systems  Constitutional: Negative for chills and fever.  Respiratory: Negative for cough and shortness of breath.   Cardiovascular: Positive for leg swelling. Negative for chest pain and palpitations.  Gastrointestinal: Negative for abdominal distention,  abdominal pain, constipation, nausea and vomiting.      Objective:    BP (!) 146/78   Pulse 95   Temp 97.9 F (36.6 C) (Oral)   Ht 5\' 7"  (1.702 m)   Wt (!) 307 lb (139.3 kg)   SpO2 95%   BMI 48.08 kg/m  BP Readings from Last 3 Encounters:  04/09/16 (!) 146/78  04/06/16 148/80  03/27/16 140/70   Wt Readings from Last 3 Encounters:  04/09/16 (!) 307 lb (139.3 kg)  04/05/16 (!) 390 lb (176.9 kg)  03/27/16 (!) 311 lb (141.1 kg)    Physical Exam  Constitutional: She appears well-developed and well-nourished.  Eyes: Conjunctivae are normal.  Cardiovascular: Normal rate, regular rhythm, normal heart sounds and normal pulses.   Pulmonary/Chest: Effort normal and breath sounds normal. She has no wheezes. She has no rhonchi. She has no rales.  Neurological: She is alert.  Skin: Skin is warm and dry.  Psychiatric: She has a normal mood and affect. Her speech is normal and behavior is normal. Thought content normal.  Vitals reviewed.      Assessment & Plan:   Problem List Items Addressed This Visit      Digestive   Fatty liver    Seen on [place] 03/2016. Pending autoimmune hepatitis labs and referral to GI due to h/o DM.       Relevant Orders   ANA (Completed)     Endocrine   Thyroid disease    Last TSH WNL. Had been taking BID synthroid. Pending TSH.       Relevant Orders   TSH (Completed)     Other   Elevated liver enzymes - Primary    Near normal since drawn in ED. Gall stones seen on RUQ [place]. No abdominal pain, N, V, fever to suggest acute cholelithiasis. Pending  referral to GI .      Relevant Orders   Ambulatory referral to Gastroenterology   Acute Hep Panel & Hep B Surface Ab (Completed)   Anti-smooth muscle antibody, IgG (Completed)   CBC with Differential/Platelet (Completed)   Comprehensive metabolic panel (Completed)   Mitochondrial antibodies (Completed)       I have discontinued Ms. Prichett's enoxaparin. I am also having her maintain her liraglutide, levothyroxine, pantoprazole, amLODipine, DULoxetine, acetaminophen, gabapentin, polyethylene glycol, cycloSPORINE, dexamethasone, rosuvastatin, ezetimibe, tolterodine, oxyCODONE-acetaminophen, oxyCODONE-acetaminophen, LORazepam, sertraline, ergocalciferol, estradiol, LUMIGAN, and cephALEXin.   No orders of the defined types were placed in this encounter.   Return precautions given.   Risks, benefits, and alternatives of the medications and treatment plan prescribed today were discussed, and patient expressed understanding.   Education regarding symptom management and diagnosis given to patient on AVS.  Continue to follow with TULLO, Mar Daring, MD for routine health maintenance.   Bonnie Hinton and I agreed with plan.   Rennie Plowman, FNP

## 2016-04-09 NOTE — Progress Notes (Signed)
Pre visit review using our clinic review tool, if applicable. No additional management support is needed unless otherwise documented below in the visit note. 

## 2016-04-09 NOTE — Telephone Encounter (Signed)
noted 

## 2016-04-09 NOTE — Patient Instructions (Addendum)
Take one and a half tablet of zoloft.   Spot check BP once daily for one week; call if values are persistently higher than 140/90.   Referral to GI for gallstones and fatty liver.   Follow up with PCP in Feb as planned

## 2016-04-09 NOTE — Telephone Encounter (Signed)
Post ED Visit - Positive Culture Follow-up  Culture report reviewed by antimicrobial stewardship pharmacist:  []  Enzo BiNathan Batchelder, Pharm.D. []  Celedonio MiyamotoJeremy Frens, Pharm.D., BCPS []  Garvin FilaMike Maccia, Pharm.D. []  Georgina PillionElizabeth Martin, Pharm.D., BCPS []  GrinnellMinh Pham, [address]Pharm[address]D., BCPS, AAHIVP []  Estella HuskMichelle Turner, Pharm.D., BCPS, AAHIVP []  Tennis Mustassie Stewart, Pharm.D. []  Sherle Poeob Vincent, [address]Pharm[address] Joe Arminger, Pharm D  Positive urine culture Treated with Cephalexin, organism sensitive to the same and no further patient follow-up is required at this time.  Virl AxeRobertson, Verl Whitmore [facility] 04/09/2016, 10:01 AM

## 2016-04-10 LAB — ACUTE HEP PANEL AND HEP B SURFACE AB
HCV Ab: NEGATIVE
Hep A IgM: NONREACTIVE
Hep B C IgM: NONREACTIVE
Hep B S Ab: NEGATIVE
Hepatitis B Surface Ag: NEGATIVE

## 2016-04-10 LAB — ANTI-SMOOTH MUSCLE ANTIBODY, IGG: Smooth Muscle Ab: <20 U (ref <20)

## 2016-04-10 LAB — ANA: Anti Nuclear Antibody(ANA): POSITIVE — AB

## 2016-04-10 LAB — MITOCHONDRIAL ANTIBODIES: Mitochondrial M2 Ab, IgG: <=20 Units (ref <=20.0)

## 2016-04-10 LAB — ANTI-NUCLEAR AB-TITER (ANA TITER): ANA Titer 1: 1:40 {titer} — ABNORMAL HIGH

## 2016-04-14 ENCOUNTER — Encounter: Payer: Self-pay | Admitting: Family

## 2016-04-14 DIAGNOSIS — K760 Fatty (change of) liver, not elsewhere classified: Secondary | ICD-10-CM | POA: Insufficient documentation

## 2016-04-14 NOTE — Assessment & Plan Note (Addendum)
Near normal since drawn in ED. Gall stones seen on RUQ US. No abdominal pain, N, V, fever to suggest acute cholelithiasis. Pending referral to GI .

## 2016-04-14 NOTE — Assessment & Plan Note (Signed)
Last TSH WNL. Had been taking BID synthroid. Pending TSH.

## 2016-04-14 NOTE — Assessment & Plan Note (Addendum)
Seen on US 03/2016. Pending autoimmune hepatitis labs and referral to GI due to h/o DM.

## 2016-04-18 ENCOUNTER — Other Ambulatory Visit: Payer: Self-pay | Admitting: Internal Medicine

## 2016-04-25 ENCOUNTER — Ambulatory Visit: Payer: Medicare Other | Admitting: Internal Medicine

## 2016-05-01 ENCOUNTER — Other Ambulatory Visit: Payer: Self-pay | Admitting: Internal Medicine

## 2016-05-02 ENCOUNTER — Ambulatory Visit: Payer: Medicare Other | Admitting: Internal Medicine

## 2016-05-02 NOTE — Telephone Encounter (Signed)
Last OV

## 2016-05-02 NOTE — Telephone Encounter (Signed)
Last OV w/ you 03/27/16 Next OV 06/26/16. It looks like it has been a while since we refilled estrace 1 mg OR maybe it has been d/c. Ok to Rf?

## 2016-05-03 NOTE — Telephone Encounter (Signed)
This was discontinued at hospital discharge

## 2016-05-06 NOTE — Telephone Encounter (Signed)
The estradiol should not be resumed because her risk of blood clot increases after surgery , and she has been very sedentary after her knee replacement.  Can you call her mail order and dc it? If not? Advise the patient not to resume it.

## 2016-05-06 NOTE — Telephone Encounter (Signed)
I called pt and pharmacy and advised both of below. Estrace rx cancelled.

## 2016-05-20 ENCOUNTER — Ambulatory Visit (INDEPENDENT_AMBULATORY_CARE_PROVIDER_SITE_OTHER): Payer: Medicare Other | Admitting: Gastroenterology

## 2016-05-20 ENCOUNTER — Encounter: Payer: Self-pay | Admitting: Gastroenterology

## 2016-05-20 VITALS — BP 148/88 | HR 78 | Ht 67.0 in | Wt 305.0 lb

## 2016-05-20 DIAGNOSIS — R7989 Other specified abnormal findings of blood chemistry: Secondary | ICD-10-CM | POA: Diagnosis not present

## 2016-05-20 DIAGNOSIS — R945 Abnormal results of liver function studies: Secondary | ICD-10-CM

## 2016-05-20 NOTE — Progress Notes (Signed)
Gastroenterology Consultation  Referring Provider:     Crecencio Mc, MD Primary Care Physician:  Crecencio Mc, MD Primary Gastroenterologist:  Dr. Jonathon Bellows  Reason for Consultation:     Abnormal LFT's         HPI:   Bonnie Hinton is a 76 y.o. y/o female referred for consultation & management  by Dr. Crecencio Mc, MD.   She has been referred for abnormal liver tests. LFT's were normal in 12/2015, 03/2016 .   Labs 03/2016 TSH-normal  Ecoli UTI in 03/2016 -treated with Keflex for 10 days  CBC-normal   Abnormal tests first noted 04/05/16 ( when she visited Er with a fall ) with AST 257, ALT 122, Alk phos 154 Acute hepatitis panel , smooth muscle antibody ,AMA was negative.  ANA was positive 1:40  TSH- low  Repeat LFT's 04/09/16 had almost normalized except for alkaline phosphatase.  RUQ USG showed chololithiasis with mild gall bladder wall thickening , negative murphys sign.   She says that a few days prior to the episode she had some upper abdominal pain, very mild , didn't have any apetite , describes the discomfort more of a fullness, stomach just didn't feel right. Denies similar episodes in the past . She cant recall any family members with history of gall stones.   Denies any over the counter health supplements, no mushroom use. No exposure to any chemicals. Presently feels well.    Past Medical History:  Diagnosis Date  . Anxiety   . Arthritis   . Chronic back pain    stenosis and arthritis  . Complication of anesthesia    anxious when she wakes up  . GERD (gastroesophageal reflux disease)    takes Pantoprazole daily  . Glaucoma    dry angle  . History of bronchitis    30+ yrs ago  . History of MRSA infection 2004   several yrs ago  . Hyperlipidemia    takes Zetia and Crestor daily  . Hypertension    take Amlodipine daily  . Hypothyroidism    takes Synthroid daily  . Joint pain   . Joint swelling   . Neuropathy (HCC)    takes Gabapentin daily    . Peripheral edema    takes Furosemide daily as needed  . Pneumonia    hx of-30 + yrs ago  . Pre-diabetes    takes Victoza daily  . Primary localized osteoarthritis of right knee   . Subdural hematoma (HCC) 8 yrs ago   hx of  . Urinary frequency   . Urinary urgency     Past Surgical History:  Procedure Laterality Date  . ABDOMINAL HYSTERECTOMY    . COLONOSCOPY    . RADIOLOGY WITH ANESTHESIA N/A 08/22/2015   Procedure: MRI OF LUMBAR SPINE   (RADIOLOGY WITH ANESTHESIA);  Surgeon: Medication Radiologist, MD;  Location: [place];  Service: Radiology;  Laterality: N/A;  . TOTAL HIP ARTHROPLASTY Right   . TOTAL HIP ARTHROPLASTY Left   . TOTAL KNEE ARTHROPLASTY Left   . TOTAL KNEE ARTHROPLASTY Right 01/01/2016   Procedure: TOTAL KNEE ARTHROPLASTY;  Surgeon: Elsie Saas, MD;  Location: [place];  Service: Orthopedics;  Laterality: Right;    Prior to Admission medications   Medication Sig Start Date End Date Taking? Authorizing Provider  acetaminophen (TYLENOL) 325 MG tablet Take 2 tablets (650 mg total) by mouth every 6 (six) hours as needed for mild pain (or Fever >/= 101). 01/04/16  Yes Kirstin  Shepperson, PA-C  amLODipine (NORVASC) 5 MG tablet Take 1 tablet (5 mg total) by mouth daily. 12/04/15  Yes Crecencio Mc, MD  aspirin EC 81 MG tablet Take 81 mg by mouth daily.   Yes Historical Provider, MD  cycloSPORINE (RESTASIS) 0.05 % ophthalmic emulsion Place 1 drop into both eyes 2 (two) times daily.   Yes Historical Provider, MD  dexamethasone (DECADRON) 1 MG tablet Take 1 mg by mouth 2 (two) times daily with a meal. Stop date 02/08/16   Yes Historical Provider, MD  DULoxetine (CYMBALTA) 30 MG capsule Take 30 mg by mouth 2 (two) times daily. 11/27/15  Yes Historical Provider, MD  ergocalciferol (DRISDOL) 50000 units capsule Take 1 capsule (50,000 Units total) by mouth once a week. 04/04/16  Yes Crecencio Mc, MD  ezetimibe (ZETIA) 10 MG tablet TAKE 1 TABLET BY MOUTH EVERY DAY 04/18/16  Yes Crecencio Mc, MD  gabapentin (NEURONTIN) 300 MG capsule Take 1 capsule (300 mg total) by mouth 2 (two) times daily. 01/04/16  Yes Kirstin Shepperson, PA-C  levothyroxine (SYNTHROID, LEVOTHROID) 200 MCG tablet TAKE 1 TABLET DAILY BEFORE BREAKFAST 05/02/16  Yes Crecencio Mc, MD  Liraglutide 18 MG/3ML SOPN Inject 0.6 mg into the skin daily.    Yes Historical Provider, MD  LORazepam (ATIVAN) 0.5 MG tablet Take 1 tablet (0.5 mg total) by mouth at bedtime. 03/27/16  Yes Crecencio Mc, MD  LUMIGAN 0.01 % SOLN Place 1-2 drops into both eyes at bedtime. 01/30/16  Yes Historical Provider, MD  oxyCODONE-acetaminophen (PERCOCET) 10-325 MG tablet Take 1 tablet by mouth 2 (two) times daily as needed for pain. May refil on or after May 05 2016 03/27/16  Yes Crecencio Mc, MD  oxyCODONE-acetaminophen (PERCOCET) 10-325 MG tablet Take 1 tablet by mouth daily as needed. May refill on or after July 03 2016 Patient taking differently: Take 1 tablet by mouth daily as needed. Starting March 2018; May refill on or after July 03 2016 03/27/16  Yes Crecencio Mc, MD  pantoprazole (PROTONIX) 40 MG tablet Take 1 tablet (40 mg total) by mouth daily. 12/04/15  Yes Crecencio Mc, MD  polyethylene glycol (MIRALAX / GLYCOLAX) packet 17grams in 6 oz of water twice a day until bowel movement.  LAXITIVE.  Restart if two days since last bowel movement 01/04/16  Yes Kirstin Shepperson, PA-C  rosuvastatin (CRESTOR) 20 MG tablet TAKE 1 TABLET BY MOUTH DAILY 03/14/16  Yes Crecencio Mc, MD  sertraline (ZOLOFT) 50 MG tablet Take 1.5 tablets (75 mg total) by mouth daily. 03/27/16  Yes Crecencio Mc, MD  tolterodine (DETROL LA) 4 MG 24 hr capsule TAKE 1 CAPSULE BY MOUTH TWICE DAILY 03/14/16  Yes Crecencio Mc, MD    Family History  Problem Relation Age of Onset  . Cancer Father   . Cancer Brother   . Diabetes Maternal Aunt      Social History  Substance Use Topics  . Smoking status: Never Smoker  . Smokeless tobacco: Never Used  .  Alcohol use No    Allergies as of 05/20/2016 - Review Complete 05/20/2016  Allergen Reaction Noted  . Erythromycin Rash 01/23/2015  . Iodinated diagnostic agents Swelling and Rash 01/23/2015  . Fentanyl Other (See Comments) 01/23/2015  . Latex Itching and Rash 01/23/2015  . Lisinopril Other (See Comments) 05/27/2015    Review of Systems:    All systems reviewed and negative except where noted in HPI.   Physical Exam:  BP (!) 148/88   Pulse 78   Ht 5' 7"  (1.702 m)   Wt (!) 305 lb (138.3 kg)   BMI 47.77 kg/m  No LMP recorded. Patient has had a hysterectomy. Psych:  Alert and cooperative. Normal mood and affect. General:   Alert,  Well-developed,obese , in wheel chair  well-nourished, pleasant and cooperative in NAD Head:  Normocephalic and atraumatic. Eyes:  Sclera clear, no icterus.   Conjunctiva pink. Ears:  Normal auditory acuity. Nose:  No deformity, discharge, or lesions. Mouth:  No deformity or lesions,oropharynx pink & moist. Neck:  Supple; no masses or thyromegaly. Lungs:  Respirations even and unlabored.  Clear throughout to auscultation.   No wheezes, crackles, or rhonchi. No acute distress. Heart:  Regular rate and rhythm; no murmurs, clicks, rubs, or gallops. Abdomen:  Normal bowel sounds.  No bruits.  Soft, non-tender and non-distended without masses, hepatosplenomegaly or hernias noted.  No guarding or rebound tenderness.    Msk:  Symmetrical without gross deformities. Good, equal movement & strength bilaterally. Pulses:  Normal pulses noted. Extremities:Pedal edema + b/l  Neurologic:  Alert and oriented x3;  grossly normal neurologically. Psych:  Alert and cooperative. Normal mood and affect.  Imaging Studies: No results found.  Assessment and Plan:   ILISA HAYWORTH is a 76 y.o. y/o female has been referred for abnormal LFT's which are resolving rapidly. Differentials include a viral illness such as EBV although she gives no clear history of the same or  the passage of a gall stone. I did explain that its hard at this time to determine if the nature of her abdominal discomfort which she cant clearly recall was due to passage of a stone, if it were a stone that passed, it would be an indication for a cholecystectomy. She rightly would like to think about her options . I will repeat her LFT's , GGT and see her back in 6 weeks , I have offered her an appointment to see the surgeons to discuss about cholecystectomy . At this point she is not too keen.   Follow up in 6 weeks   Dr Jonathon Bellows MD

## 2016-05-24 ENCOUNTER — Other Ambulatory Visit: Payer: Self-pay | Admitting: Radiology

## 2016-05-24 ENCOUNTER — Ambulatory Visit (INDEPENDENT_AMBULATORY_CARE_PROVIDER_SITE_OTHER): Payer: Medicare Other | Admitting: Family Medicine

## 2016-05-24 ENCOUNTER — Telehealth: Payer: Self-pay | Admitting: Radiology

## 2016-05-24 ENCOUNTER — Encounter: Payer: Self-pay | Admitting: Family Medicine

## 2016-05-24 VITALS — BP 127/81 | HR 92 | Temp 98.6°F | Wt 301.4 lb

## 2016-05-24 DIAGNOSIS — R42 Dizziness and giddiness: Secondary | ICD-10-CM | POA: Insufficient documentation

## 2016-05-24 DIAGNOSIS — R748 Abnormal levels of other serum enzymes: Secondary | ICD-10-CM

## 2016-05-24 LAB — HEPATIC FUNCTION PANEL
ALT: 18 U/L (ref 0–35)
AST: 24 U/L (ref 0–37)
Albumin: 4 g/dL (ref 3.5–5.2)
Alkaline Phosphatase: 78 U/L (ref 39–117)
Bilirubin, Direct: 0.1 mg/dL (ref 0.0–0.3)
Total Bilirubin: 0.3 mg/dL (ref 0.2–1.2)
Total Protein: 7.3 g/dL (ref 6.0–8.3)

## 2016-05-24 LAB — GAMMA GT: GGT: 12 U/L (ref 7–51)

## 2016-05-24 NOTE — Assessment & Plan Note (Signed)
New problem. Orthostatics positive today. Questionable component of vertigo. Advised to decrease her amlodipine to 2.5 mg daily. Discussed treatment for vertigo with vestibular rehabilitation and she is in agreement. Will place referral.

## 2016-05-24 NOTE — Patient Instructions (Signed)
Decrease the amlodipine to 1/2 tablet daily.  We will arrange your vestibular rehab appt.  Take care  Dr. Adriana Simasook

## 2016-05-24 NOTE — Progress Notes (Signed)
Subjective:  Patient ID: Bonnie Hinton, female    DOB: Nov 21, 1940  Age: 76 y.o. MRN: [id number]  CC: Dizziness  HPI:  76 year old female with extensive past medical history including frequent falls presents with complaints of dizziness.  Patient reports that the dizziness started on Tuesday. Started suddenly. She describes it as if the room is spinning. She states that it occurs with certain movements of her head and particularly when going to lie down or sitting up. Last briefly and then resolves until it recurs again. She endorses a prior history of vertigo. She has not fallen since her recent ED visit in January. No associated nausea or vomiting. She endorses compliance with her medications. She endorses a prior history of labile blood pressures. No new medication additions. She has recently been weaned off of benzodiazepine. No other associated symptoms. No other complaints or concerns at this time.  Social Hx   Social History   Social History  . Marital status: Widowed    Spouse name: N/A  . Number of children: N/A  . Years of education: N/A   Social History Main Topics  . Smoking status: Never Smoker  . Smokeless tobacco: Never Used  . Alcohol use No  . Drug use: No  . Sexual activity: Not Asked   Other Topics Concern  . None   Social History Narrative  . None    Review of Systems  Constitutional: Negative.   Neurological: Positive for dizziness.   Objective:  BP 127/81 Comment: standing  Pulse 92   Temp 98.6 F (37 C) (Oral)   Wt (!) 301 lb 6.4 oz (136.7 kg)   SpO2 96%   BMI 47.21 kg/m   BP/Weight 05/24/2016 05/20/2016 04/09/2016  Systolic BP 127 148 146  Diastolic BP 81 88 78  Wt. (Lbs) 301.4 305 307  BMI 47.21 47.77 48.08   Physical Exam  Constitutional: She is oriented to person, place, and time. She appears well-developed. No distress.  HENT:  Head: Normocephalic and atraumatic.  Eyes: Pupils are equal, round, and reactive to light.    Cardiovascular: Normal rate and regular rhythm.   Pulmonary/Chest: Effort normal and breath sounds normal.  Neurological: She is alert and oriented to person, place, and time.  Psychiatric: She has a normal mood and affect.  Vitals reviewed.  Lab Results  Component Value Date   WBC 8.4 04/09/2016   HGB 13.7 04/09/2016   HCT 41.6 04/09/2016   PLT 349.0 04/09/2016   GLUCOSE 114 (H) 04/09/2016   CHOL 129 03/27/2016   TRIG 130.0 03/27/2016   HDL 56.00 03/27/2016   LDLDIRECT 50.0 03/27/2016   LDLCALC 47 03/27/2016   ALT 41 (H) 04/09/2016   AST 26 04/09/2016   NA 136 04/09/2016   K 4.0 04/09/2016   CL 101 04/09/2016   CREATININE 0.71 04/09/2016   BUN 9 04/09/2016   CO2 25 04/09/2016   TSH 0.07 (L) 04/09/2016   INR 1.05 12/21/2015   HGBA1C 7.1 (H) 03/27/2016   MICROALBUR 1.4 08/01/2015    Assessment & Plan:   Problem List Items Addressed This Visit    Elevated liver enzymes   Relevant Orders   Hepatic function panel   Gamma GT   Dizziness - Primary    New problem. Orthostatics positive today. Questionable component of vertigo. Advised to decrease her amlodipine to 2.5 mg daily. Discussed treatment for vertigo with vestibular rehabilitation and she is in agreement. Will place referral.      Relevant Orders  Ambulatory referral to Physical Therapy     Follow-up: PRN  Everlene OtherJayce Colena Ketterman DO [facility] [facility]

## 2016-05-24 NOTE — Telephone Encounter (Signed)
NA

## 2016-05-24 NOTE — Progress Notes (Signed)
Pre visit review using our clinic review tool, if applicable. No additional management support is needed unless otherwise documented below in the visit note. 

## 2016-05-27 ENCOUNTER — Other Ambulatory Visit: Payer: Self-pay | Admitting: Internal Medicine

## 2016-06-11 ENCOUNTER — Ambulatory Visit: Payer: Medicare Other | Attending: Family Medicine | Admitting: Physical Therapy

## 2016-06-11 DIAGNOSIS — H8111 Benign paroxysmal vertigo, right ear: Secondary | ICD-10-CM | POA: Diagnosis present

## 2016-06-11 DIAGNOSIS — R2689 Other abnormalities of gait and mobility: Secondary | ICD-10-CM | POA: Insufficient documentation

## 2016-06-11 DIAGNOSIS — H8112 Benign paroxysmal vertigo, left ear: Secondary | ICD-10-CM | POA: Diagnosis present

## 2016-06-11 NOTE — Patient Instructions (Signed)
Rolling    With pillow under head, start on back. Roll slowly to right. Hold position until symptoms subside. Roll slowly onto left side. Hold position until symptoms subside. Repeat sequence _5___ times per session. Do _3-5___ sessions per day.  Copyright  VHI. All rights reserved.   

## 2016-06-11 NOTE — Therapy (Signed)
[facility] [address] [place], [place], [address] Phone: [phone]   Fax:  [phone]  Physical Therapy Evaluation  Patient Details  Name: Bonnie Hinton MRN: [id number] Date of Birth: Aug 02, 1940 Referring Provider: Everlene Other, DO  Encounter Date: 06/11/2016      PT End of Session - 06/11/16 2007    Visit Number 1   Number of Visits 9   Date for PT Re-Evaluation 07/12/16   Authorization Type BCBS   PT Start Time 1017   PT Stop Time 1103   PT Time Calculation (min) 46 min      Past Medical History:  Diagnosis Date  . Anxiety   . Arthritis   . Chronic back pain    stenosis and arthritis  . Complication of anesthesia    anxious when she wakes up  . GERD (gastroesophageal reflux disease)    takes Pantoprazole daily  . Glaucoma    dry angle  . History of bronchitis    30+ yrs ago  . History of MRSA infection 2004   several yrs ago  . Hyperlipidemia    takes Zetia and Crestor daily  . Hypertension    take Amlodipine daily  . Hypothyroidism    takes Synthroid daily  . Joint pain   . Joint swelling   . Neuropathy (HCC)    takes Gabapentin daily  . Peripheral edema    takes Furosemide daily as needed  . Pneumonia    hx of-30 + yrs ago  . Pre-diabetes    takes Victoza daily  . Primary localized osteoarthritis of right knee   . Subdural hematoma (HCC) 8 yrs ago   hx of  . Urinary frequency   . Urinary urgency     Past Surgical History:  Procedure Laterality Date  . ABDOMINAL HYSTERECTOMY    . COLONOSCOPY    . RADIOLOGY WITH ANESTHESIA N/A 08/22/2015   Procedure: MRI OF LUMBAR SPINE   (RADIOLOGY WITH ANESTHESIA);  Surgeon: Medication Radiologist, MD;  Location: [facility] OR;  Service: Radiology;  Laterality: N/A;  . TOTAL HIP ARTHROPLASTY Right   . TOTAL HIP ARTHROPLASTY Left   . TOTAL KNEE ARTHROPLASTY Left   . TOTAL KNEE ARTHROPLASTY Right 01/01/2016   Procedure: TOTAL KNEE ARTHROPLASTY;   Surgeon: Salvatore Marvel, MD;  Location: [facility] OR;  Service: Orthopedics;  Laterality: Right;    There were no vitals filed for this visit.       Subjective Assessment - 06/11/16 1952    Subjective Daughter reports symptoms started on Feb. 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  Yes   Is the patient reluctant to leave their home because of a fear of falling?  Yes     Prior Function   Level of Independence Independent with basic ADLs;Independent with household mobility with device;Requires assistive device for independence            Vestibular Assessment - 06/11/16 1038      Vestibular Assessment   General Observation Pt is a 76 year old lady who presents to PT in manual wheelchair - unable to amb. due to dizziness; is accompanied by her daughter     Symptom Behavior   Type of Dizziness Spinning   Frequency of Dizziness daily - depends on movements   Duration of Dizziness seconds to minutes   Aggravating Factors Rolling to right   Relieving Factors Lying supine     Positional Testing   Horizontal Canal Testing Horizontal Canal Right;Horizontal Canal Left;Horizontal  Canal Right Intensity;Horizontal Canal Left Intensity     Horizontal Canal Right   Horizontal Canal Right Duration approx. 15 secs   Horizontal Canal Right Symptoms Geotrophic     Horizontal Canal Left   Horizontal Canal Left Duration approx 5 secs   Horizontal Canal Left Symptoms Geotrophic     Horizontal Canal Right Intensity   Horizontal Canal Right Intensity Severe     Horizontal Canal Left Intensity   Horizontal Canal Left Intensity Mild      Pt was treated for Rt horizontal canalithiasis using Bar-B-Que roll with mod to max assist for rolling from Rt sidelying to Prone position; improvement noted on 2nd rep with less nystagmus and decr. c/o vertigo during 2nd rep of canalith  Repositioning maneuver  Educated pt and daughter in HEP for repetitive rolling for habituation  Some slight rotary nystagmus noted with head turned to left side (pt in supine) - possibly indicative of multi-canal BPPV                PT Education - 06/11/16 2006    Education provided Yes   Education Details Rolling for habituation of Rt horizontal canal BPPV   Person(s) Educated Patient;Child(ren)   Methods Explanation;Demonstration   Comprehension Verbalized understanding;Returned demonstration             PT Long Term Goals - 06/11/16 2036      PT LONG TERM GOAL #1   Title Pt will have a negative Rt horizontal roll test to indicate resolution of Rt horizontal canal BPPV.  07-12-16   Time 4   Period Weeks   Status New     PT LONG TERM GOAL #2   Title Pt will report no vertigo with bed mobility. 07-12-16   Time 4   Period Weeks   Status New     PT LONG TERM GOAL #3   Title Independent in HEP for habituation exercises.  07-12-16   Time 4   Period Weeks   Status New     PT LONG TERM GOAL #4   Title Pt will report ability to perform household amb. with RW without experiencing vertigo.  07-12-16   Time 4   Period Weeks   Status New               Plan - 06/11/16  2008    Clinical Impression Statement Pt is a 75  yr old lady with signs and symptoms consistent with Rt horizontal canalithiasis as horizontal Rt geotropic nystagmus was noted with Rt cervical rotation.  Pt also exhibited some nystagmus appearing to be slightly rotary with head turned to Lt side, possibly indicative of multi-canal BPPV.  PMH includes Type 2 DM with CKD and neuropathy, chronic back pain, HTN, s/p SDH due to fall approx. 9 yrs ago, morbid obesity.  Moderate complexity decision making required in POC.   Rehab Potential Good   PT Frequency 2x / week   PT Duration 4 weeks   PT Treatment/Interventions ADLs/Self Care Home Management;Canalith Repostioning;Gait training;Functional mobility training;Patient/family education;Neuromuscular re-education;Therapeutic exercise;Therapeutic activities;Balance training;Vestibular   PT Next Visit Plan recheck Rt horizontal canal BPPV; Bar-b-que roll prn:  assess Lt posterior canal BPPV as vertigo appears to be multi canal BPPV   PT Home Exercise Plan rolling for habituation   Consulted and Agree with Plan of Care Patient;Family member/caregiver   Family Member Consulted daughter      Patient will benefit from skilled therapeutic intervention in order to improve the following deficits and impairments:  Dizziness, Decreased mobility, Difficulty walking  Visit Diagnosis: BPPV (benign paroxysmal positional vertigo), right - Plan: PT plan of care cert/re-cert  BPPV (benign paroxysmal positional vertigo), left - Plan: PT plan of care cert/re-cert  Other abnormalities of gait and mobility - Plan: PT plan of care cert/re-cert      G-Codes - 2016-07-09 08-28-2041    Functional Assessment Tool Used (Outpatient Only) (+) Rt horizontal roll test - indicative of Rt horizontal BPPV   Functional Limitation Changing and maintaining body position   Changing and Maintaining Body Position Current Status (W0981) At least 60 percent but less than 80 percent impaired,  limited or restricted   Changing and Maintaining Body Position Goal Status (X9147) At least 1 percent but less than 20 percent impaired, limited or restricted       Problem List Patient Active Problem List   Diagnosis Date Noted  . Dizziness 05/24/2016  . Elevated liver enzymes 04/14/2016  . Fatty liver 04/09/2016  . Anxiety, generalized 02/20/2016  . Leukocytosis 01/11/2016  . Arthritis   . Thyroid disease   . Chronic kidney disease   . Subdural hematoma (HCC)   . Status post bilateral hip replacements   . Status post left knee replacement   . Chronic low back pain with right-sided sciatica   . Essential hypertension, benign 07/25/2015  . Preoperative clearance 07/25/2015  . Hyperlipidemia 07/25/2015  . Urinary urgency 04/26/2015  . Xerosis of skin 04/26/2015  . Hyperkalemia 04/26/2015  . 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 Morbid obesity (HCC) 01/24/2015  . Vitamin D deficiency 01/24/2015  . 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

## 2016-06-15 ENCOUNTER — Other Ambulatory Visit: Payer: Self-pay | Admitting: Internal Medicine

## 2016-06-15 DIAGNOSIS — IMO0001 Reserved for inherently not codable concepts without codable children: Secondary | ICD-10-CM

## 2016-06-15 DIAGNOSIS — I129 Hypertensive chronic kidney disease with stage 1 through stage 4 chronic kidney disease, or unspecified chronic kidney disease: Principal | ICD-10-CM

## 2016-06-15 DIAGNOSIS — E1122 Type 2 diabetes mellitus with diabetic chronic kidney disease: Secondary | ICD-10-CM

## 2016-06-18 ENCOUNTER — Ambulatory Visit: Payer: Medicare Other | Admitting: Physical Therapy

## 2016-06-18 DIAGNOSIS — H8111 Benign paroxysmal vertigo, right ear: Secondary | ICD-10-CM

## 2016-06-19 NOTE — Therapy (Signed)
[facility] [address] [place], [place], [address] Phone: [phone]   Fax:  [phone]  Physical Therapy Treatment  Patient Details  Name: Bonnie Hinton MRN: [id number] Date of Birth: [birth year] Referring Provider: Everlene OtherJayce Cook, DO  Encounter Date: 06/18/2016      PT End of Session - 06/19/16 1204    Visit Number 2   Number of Visits 9   Date for PT Re-Evaluation 07/12/16   Authorization Type BCBS   PT Start Time 1103   PT Stop Time 1147   PT Time Calculation (min) 44 min      Past Medical History:  Diagnosis Date  . Anxiety   . Arthritis   . Chronic back pain    stenosis and arthritis  . Complication of anesthesia    anxious when she wakes up  . GERD (gastroesophageal reflux disease)    takes Pantoprazole daily  . Glaucoma    dry angle  . History of bronchitis    30+ yrs ago  . History of MRSA infection 2004   several yrs ago  . Hyperlipidemia    takes Zetia and Crestor daily  . Hypertension    take Amlodipine daily  . Hypothyroidism    takes Synthroid daily  . Joint pain   . Joint swelling   . Neuropathy (HCC)    takes Gabapentin daily  . Peripheral edema    takes Furosemide daily as needed  . Pneumonia    hx of-30 + yrs ago  . Pre-diabetes    takes Victoza daily  . Primary localized osteoarthritis of right knee   . Subdural hematoma (HCC) 8 yrs ago   hx of  . Urinary frequency   . Urinary urgency     Past Surgical History:  Procedure Laterality Date  . ABDOMINAL HYSTERECTOMY    . COLONOSCOPY    . RADIOLOGY WITH ANESTHESIA N/A 08/22/2015   Procedure: MRI OF LUMBAR SPINE   (RADIOLOGY WITH ANESTHESIA);  Surgeon: Medication Radiologist, MD;  Location: [facility] OR;  Service: Radiology;  Laterality: N/A;  . TOTAL HIP ARTHROPLASTY Right   . TOTAL HIP ARTHROPLASTY Left   . TOTAL KNEE ARTHROPLASTY Left   . TOTAL KNEE ARTHROPLASTY Right 01/01/2016   Procedure: TOTAL KNEE ARTHROPLASTY;   Surgeon: Salvatore Marvelobert Wainer, MD;  [facility];  Service: Orthopedics;  Laterality: Right;    There were no vitals filed for this visit.      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 No/denies                          Vestibular Treatment/Exercise - 06/19/16 0001      Vestibular Treatment/Exercise   Vestibular Treatment Provided Canalith Repositioning   Canalith Repositioning Canal Roll Right     Canal Roll Right   Number of Reps  3   Overall Response  Improved Symptoms   Response Details  less c/o vertigo reported on 2nd and  3rd rep     NeuroRe-ed; positional testing for BPPV:  (+) Rt horizontal roll test with Rt geotropic horizontal nystagmus Lt ageotropic horizontal nystagmus with Rt cervical rotation - indicative of Lt horizontal cupulolithiasis  Pt performed habituation including sit to supine, rolling Rt, then to Lt side and back to Rt side x 3 reps with assistance due to decr. Mobility  Self Care - reviewed HEP (repetitive rolling for habituation) for Rt horizontal canalithiasis; Pt's caregiver (CNA) was instructed in this HEP  to be able to assist pt at home          PT Education - 06/19/16 1203    Education provided Yes   Education Details reviewed rolling for habituation of Rt horizontal BPPV   Person(s) Educated Patient;Other (comment)  CNA   Methods Explanation   Comprehension Verbalized understanding             PT Long Term Goals - 06/11/16 2036      PT LONG TERM GOAL #1   Title Pt will have a negative Rt horizontal  roll test to indicate resolution of Rt horizontal canal BPPV.  07-12-16   Time 4   Period Weeks   Status New     PT LONG TERM GOAL #2   Title Pt will report no vertigo with bed mobility. 07-12-16   Time 4   Period Weeks   Status New     PT LONG TERM GOAL #3   Title Independent in HEP for habituation exercises.  07-12-16   Time 4   Period Weeks   Status New     PT LONG TERM GOAL #4   Title Pt will report ability to perform household amb. with RW without experiencing vertigo.  07-12-16   Time 4   Period Weeks   Status New               Plan - 06/19/16 1205    Clinical Impression Statement Pt continues to have signs and symptoms of Rt horizontal cupulolithiasis and canalithiasis as Rt geotropic horizontal nystagmus noted and ageotropric Rt nystagmus noted with Lt cervical rotation.  Symptoms improved per pt report on 3rd rep of horizontal roll (Bar-B-Que roll for canalith repositioning.    Rehab Potential Good   PT Frequency 2x / week   PT Duration 4 weeks   PT Treatment/Interventions ADLs/Self Care Home Management;Canalith Repostioning;Gait training;Functional mobility training;Patient/family education;Neuromuscular re-education;Therapeutic exercise;Therapeutic activities;Balance training;Vestibular   PT Next Visit Plan recheck Rt horizontal canal BPPV; Bar-b-que roll prn:  assess Lt posterior canal BPPV as vertigo appears to be multi canal BPPV   PT Home Exercise Plan rolling for habituation   Consulted and Agree with Plan of Care Patient;Other (Comment)      Patient will benefit from skilled therapeutic intervention in order to improve the following deficits and impairments:  Dizziness, Decreased mobility, Difficulty walking  Visit Diagnosis: BPPV (benign paroxysmal positional vertigo), right     Problem List Patient Active Problem List   Diagnosis Date Noted  . Dizziness 05/24/2016  . Elevated liver enzymes 04/14/2016  . Fatty liver 04/09/2016  . Anxiety,  generalized 02/20/2016  . Leukocytosis 01/11/2016  . Arthritis   . Thyroid disease   . Chronic kidney disease   . Subdural hematoma (HCC)   . Status post bilateral hip replacements   . Status post left knee replacement   . Chronic low back pain with right-sided sciatica   . Essential hypertension, benign 07/25/2015  . Preoperative clearance 07/25/2015  .  Hyperlipidemia 07/25/2015  . Urinary urgency 04/26/2015  . Xerosis of skin 04/26/2015  . Hyperkalemia 04/26/2015  . 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 Morbid obesity (HCC) 01/24/2015  . Vitamin D deficiency 01/24/2015  . 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

## 2016-06-20 ENCOUNTER — Ambulatory Visit: Payer: Medicare Other | Admitting: Physical Therapy

## 2016-06-20 DIAGNOSIS — H8111 Benign paroxysmal vertigo, right ear: Secondary | ICD-10-CM

## 2016-06-21 ENCOUNTER — Other Ambulatory Visit: Payer: Self-pay | Admitting: Internal Medicine

## 2016-06-21 NOTE — Therapy (Signed)
[facility] [address] [place], [place], [address] Phone: [phone]   Fax:  [phone]  Physical Therapy Treatment  Patient Details  Name: Bonnie Hinton MRN: [id number] Date of Birth: [birth year] Referring Provider: Everlene Other, DO  Encounter Date: 06/20/2016      PT End of Session - 06/21/16 1400    Visit Number 3   Number of Visits 9   Date for PT Re-Evaluation 07/12/16   Authorization Type BCBS   PT Start Time 1150   PT Stop Time 1223  pt requested to leave a little early for another appt   PT Time Calculation (min) 33 min      Past Medical History:  Diagnosis Date  . Anxiety   . Arthritis   . Chronic back pain    stenosis and arthritis  . Complication of anesthesia    anxious when she wakes up  . GERD (gastroesophageal reflux disease)    takes Pantoprazole daily  . Glaucoma    dry angle  . History of bronchitis    30+ yrs ago  . History of MRSA infection 2004   several yrs ago  . Hyperlipidemia    takes Zetia and Crestor daily  . Hypertension    take Amlodipine daily  . Hypothyroidism    takes Synthroid daily  . Joint pain   . Joint swelling   . Neuropathy (HCC)    takes Gabapentin daily  . Peripheral edema    takes Furosemide daily as needed  . Pneumonia    hx of-30 + yrs ago  . Pre-diabetes    takes Victoza daily  . Primary localized osteoarthritis of right knee   . Subdural hematoma (HCC) 8 yrs ago   hx of  . Urinary frequency   . Urinary urgency     Past Surgical History:  Procedure Laterality Date  . ABDOMINAL HYSTERECTOMY    . COLONOSCOPY    . RADIOLOGY WITH ANESTHESIA N/A 08/22/2015   Procedure: MRI OF LUMBAR SPINE   (RADIOLOGY WITH ANESTHESIA);  Surgeon: Medication Radiologist, MD;  Location: [facility] OR;  Service: Radiology;  Laterality: N/A;  . TOTAL HIP ARTHROPLASTY Right   . TOTAL HIP ARTHROPLASTY Left   . TOTAL KNEE ARTHROPLASTY Left   . TOTAL KNEE ARTHROPLASTY  Right 01/01/2016   Procedure: TOTAL KNEE ARTHROPLASTY;  Surgeon: Salvatore Marvel, MD;  Location: [facility] OR;  Service: Orthopedics;  Laterality: Right;    There were no vitals filed for this visit.      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 No/denies           NeuroRe-ed;  Pt had slight rotary nystagmus with passive Rt cervical rotation - approx. 3 sec duration;  Rt and Lt Dix-Hallpike tests (-) with no nystagmus and no c/o vertigo in either position  Pt performed sit to Rt sidelying with no nystagmus and only c/o minimal light-headedness with return to upright position  Sit to Lt sidelying position - no nystagmus and no c/o vertigo  Pt performed sit to supine - no nystagmus and no c/o vertigo; Pt rolled onto Rt side - no nystagmus and no c/o vertigo Pt rolled from Rt sidelying to Lt sidelying without c/o vertigo and no nystagmus noted  Unable to provoke any vertigo after initial Rt cervical rotation (passively);  Vertigo appears to be mostly resolved  Instructed pt to perform repetitive sit to Rt sidelying as tolerated (based on Rt shoulder pain) if vertigo not fully resolved                      PT Education - 06/21/16 1359    Education provided Yes   Education Details sit to sidelying if vertigo not fully resolved   Person(s) Educated Patient;Other (comment)   Methods Explanation;Demonstration   Comprehension Verbalized understanding;Returned demonstration             PT Long Term Goals - 06/11/16  2036      PT LONG TERM GOAL #1   Title Pt will have a negative Rt horizontal roll test to indicate resolution of Rt horizontal canal BPPV.  07-12-16   Time 4   Period Weeks   Status New     PT LONG TERM GOAL #2   Title Pt will report no vertigo with bed mobility. 07-12-16   Time 4   Period Weeks   Status New     PT LONG TERM GOAL #3   Title Independent in HEP for habituation exercises.  07-12-16   Time 4   Period Weeks   Status New     PT LONG TERM GOAL #4   Title Pt will report ability to perform household amb. with RW without experiencing vertigo.  07-12-16   Time 4   Period Weeks   Status New               Plan - 06/21/16 1402    Clinical Impression Statement Pt had nystagmus with passive right cervical rotation with pt supine - nystagmus appeared slightly rotary but difficult to determine direction as it was of short duration, and then unable to provoke again with any positional testing. Rt and Lt Dix-Hallpike (-) with no nystagmus and no c/o vertigo in test position.  Rt and Lt horizontal roll test (-) with no nystagmus and no c/o vertigo with either of these test positions.  Rt horizontal canalithiasis appears to have resolved.                                                                                                  Rehab Potential Good   PT Frequency 2x / week   PT Duration 4 weeks   PT Treatment/Interventions ADLs/Self Care Home Management;Canalith Repostioning;Gait training;Functional mobility training;Patient/family education;Neuromuscular re-education;Therapeutic exercise;Therapeutic activities;Balance training;Vestibular   PT Next Visit Plan recheck Rt horizontal canal BPPV; Bar-b-que roll prn:  assess Lt posterior canal BPPV as vertigo appears to be multi canal BPPV   PT Home Exercise Plan rolling for habituation; D/C'd rollling on 06-20-16 - instructed to  do sit to sidelying if vertigo not fully resolved   Consulted and Agree with Plan of Care Patient    Family Member Consulted daughter      Patient will benefit from skilled therapeutic intervention in order to improve the following deficits and impairments:  Dizziness, Decreased mobility, Difficulty walking  Visit Diagnosis: BPPV (benign paroxysmal positional vertigo), right     Problem List Patient Active Problem List   Diagnosis Date Noted  . Dizziness 05/24/2016  . Elevated liver enzymes 04/14/2016  . Fatty liver 04/09/2016  . Anxiety, generalized 02/20/2016  . Leukocytosis 01/11/2016  . Arthritis   . Thyroid disease   . Chronic kidney disease   . Subdural hematoma (HCC)   . Status post bilateral hip replacements   . Status post left knee replacement   . Chronic low back pain with right-sided sciatica   . Essential hypertension, benign 07/25/2015  . Preoperative clearance 07/25/2015  . Hyperlipidemia 07/25/2015  . Urinary urgency 04/26/2015  . Xerosis of skin 04/26/2015  . Hyperkalemia 04/26/2015  . 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 Morbid obesity (HCC) 01/24/2015  . Vitamin D deficiency 01/24/2015  . 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 [place], [place], [address] Phone: [phone]   Fax:  [phone]  Name: KARAN INCLAN MRN: [id number] Date of Birth: 1940/05/09

## 2016-06-24 NOTE — Telephone Encounter (Signed)
Refill denied,  Should come from her eye doctor,  Not me

## 2016-06-24 NOTE — Telephone Encounter (Signed)
Please advise, this is not on the med list currently, thanks

## 2016-06-25 ENCOUNTER — Other Ambulatory Visit: Payer: Self-pay

## 2016-06-25 DIAGNOSIS — IMO0001 Reserved for inherently not codable concepts without codable children: Secondary | ICD-10-CM

## 2016-06-25 DIAGNOSIS — I129 Hypertensive chronic kidney disease with stage 1 through stage 4 chronic kidney disease, or unspecified chronic kidney disease: Principal | ICD-10-CM

## 2016-06-25 DIAGNOSIS — E1122 Type 2 diabetes mellitus with diabetic chronic kidney disease: Secondary | ICD-10-CM

## 2016-06-25 MED ORDER — GLUCOSE BLOOD VI STRP: ORAL_STRIP | 12 refills | Status: DC

## 2016-06-26 ENCOUNTER — Encounter: Payer: Self-pay | Admitting: Internal Medicine

## 2016-06-26 ENCOUNTER — Ambulatory Visit (INDEPENDENT_AMBULATORY_CARE_PROVIDER_SITE_OTHER): Payer: Medicare Other | Admitting: Internal Medicine

## 2016-06-26 VITALS — BP 110/62 | HR 81 | Temp 98.1°F | Resp 15 | Ht 67.0 in | Wt 313.8 lb

## 2016-06-26 DIAGNOSIS — Z8673 Personal history of transient ischemic attack (TIA), and cerebral infarction without residual deficits: Secondary | ICD-10-CM

## 2016-06-26 DIAGNOSIS — K8020 Calculus of gallbladder without cholecystitis without obstruction: Secondary | ICD-10-CM

## 2016-06-26 DIAGNOSIS — M5441 Lumbago with sciatica, right side: Secondary | ICD-10-CM | POA: Diagnosis not present

## 2016-06-26 DIAGNOSIS — K760 Fatty (change of) liver, not elsewhere classified: Secondary | ICD-10-CM | POA: Diagnosis not present

## 2016-06-26 DIAGNOSIS — E1122 Type 2 diabetes mellitus with diabetic chronic kidney disease: Secondary | ICD-10-CM

## 2016-06-26 DIAGNOSIS — F411 Generalized anxiety disorder: Secondary | ICD-10-CM | POA: Diagnosis not present

## 2016-06-26 DIAGNOSIS — IMO0001 Reserved for inherently not codable concepts without codable children: Secondary | ICD-10-CM

## 2016-06-26 DIAGNOSIS — N189 Chronic kidney disease, unspecified: Secondary | ICD-10-CM | POA: Diagnosis not present

## 2016-06-26 DIAGNOSIS — E039 Hypothyroidism, unspecified: Secondary | ICD-10-CM

## 2016-06-26 DIAGNOSIS — G8929 Other chronic pain: Secondary | ICD-10-CM

## 2016-06-26 DIAGNOSIS — I129 Hypertensive chronic kidney disease with stage 1 through stage 4 chronic kidney disease, or unspecified chronic kidney disease: Secondary | ICD-10-CM

## 2016-06-26 LAB — POCT GLYCOSYLATED HEMOGLOBIN (HGB A1C): Hemoglobin A1C: 6.7

## 2016-06-26 LAB — TSH: TSH: 13.28 u[IU]/mL — ABNORMAL HIGH (ref 0.35–4.50)

## 2016-06-26 MED ORDER — OXYCODONE-ACETAMINOPHEN 10-325 MG PO TABS: 1.0000 | ORAL_TABLET | Freq: Three times a day (TID) | ORAL | 0 refills | Status: DC | PRN

## 2016-06-26 MED ORDER — LIRAGLUTIDE 18 MG/3ML ~~LOC~~ SOPN: 0.6000 mg | PEN_INJECTOR | Freq: Every day | SUBCUTANEOUS | 5 refills | Status: DC

## 2016-06-26 MED ORDER — OXYCODONE-ACETAMINOPHEN 10-325 MG PO TABS: 1.0000 | ORAL_TABLET | Freq: Two times a day (BID) | ORAL | 0 refills | Status: DC | PRN

## 2016-06-26 MED ORDER — OXYCODONE-ACETAMINOPHEN 10-325 MG PO TABS: 1.0000 | ORAL_TABLET | Freq: Every day | ORAL | 0 refills | Status: DC | PRN

## 2016-06-26 NOTE — Progress Notes (Signed)
Pre visit review using our clinic review tool, if applicable. No additional management support is needed unless otherwise documented below in the visit note. 

## 2016-06-26 NOTE — Progress Notes (Signed)
Subjective:  Patient ID: Bonnie Hinton, female    DOB: 20-Oct-1940  Age: 76 y.o. 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 Diagnoses of Acquired hypothyroidism, History of CVA (cerebrovascular accident), Morbid obesity (HCC), Anxiety, generalized, Calculus of gallbladder without cholecystitis without obstruction, Chronic right-sided low back pain with right-sided sciatica, and Fatty liver were also pertinent to this visit.  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  Lorazepam dose was reduced to 0.5 mg at bedtime only. CURRENTLY NOT TAKING LORAZEPAM   Taking celebrex twice daily 200 mg (prescribed by another physician)   ER VISIT IN MID January for recurrent falls,  Treated for UTI and diagnosed with fatty liver and asymptomatic gallstones , Told to follow up with GI and IM. Saw Margaret.  GI referral is pending    Rehab has been very slow,  Has gained 10 lbs due to less walking due to recent recurrence of vertigo.  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  lost some ground during the past month so she has not walked much, now having SI joint pain.  Unable to continue oxycodone wean due to si joint.  Her orthopedist  Maurice Small won't see her for a joint injection until she has no more vertigo and is off the cymbalta.  The vertigo has resolved.   Hypothyroid: secondary to overmedication (patient was taking synthroid bid as well as her statin) for several weeks.  Has been taking dose correctly for the past 4 weeks.   Diet reviewed in detail.   Outpatient Medications  Prior to Visit  Medication Sig Dispense Refill  . acetaminophen (TYLENOL) 325 MG tablet Take 2 tablets (650 mg total) by mouth every 6 (six) hours as needed for mild pain (or Fever >/= 101).    Marland Kitchen amLODipine (NORVASC) 5 MG tablet Take 1 tablet (5 mg total) by mouth daily. 90 tablet 3  . aspirin EC 81 MG tablet Take 81 mg by mouth daily.    . cycloSPORINE (RESTASIS) 0.05 % ophthalmic emulsion Place 1 drop into both eyes 2 (two) times daily.    . DULoxetine (CYMBALTA) 30 MG capsule Take 30 mg by mouth 2 (two) times daily.    . ergocalciferol (DRISDOL) 50000 units capsule Take 1 capsule (50,000 Units total) by mouth once a week. 4 capsule 3  . ezetimibe (ZETIA) 10 MG tablet TAKE 1 TABLET BY MOUTH EVERY DAY 30 tablet 0  . gabapentin (NEURONTIN) 300 MG capsule Take 1 capsule (300 mg total) by mouth 2 (two) times daily. 60 capsule 0  . glucose blood (ONETOUCH VERIO) test strip USE TO TEST BLOOD SUGAR ONCE DAILY 100 each 12  . LUMIGAN 0.01 % SOLN Place 1-2 drops into both eyes at bedtime.    . pantoprazole (PROTONIX) 40 MG tablet Take 1 tablet (40 mg total) by mouth daily. 90 tablet 3  . polyethylene glycol (MIRALAX / GLYCOLAX) packet 17grams in 6 oz of water twice a day until bowel movement.  LAXITIVE.  Restart if two days since last bowel movement 14 each  0  . rosuvastatin (CRESTOR) 20 MG tablet TAKE 1 TABLET BY MOUTH DAILY 30 tablet 3  . sertraline (ZOLOFT) 50 MG tablet Take 1.5 tablets (75 mg total) by mouth daily. 135 tablet 1  . tolterodine (DETROL LA) 4 MG 24 hr capsule TAKE 1 CAPSULE BY MOUTH TWICE DAILY 60 capsule 3  . levothyroxine (SYNTHROID, LEVOTHROID) 200 MCG tablet TAKE 1 TABLET DAILY BEFORE BREAKFAST 90 tablet 1  . Liraglutide 18 MG/3ML SOPN Inject 0.6 mg into the skin daily.     Marland Kitchen oxyCODONE-acetaminophen (PERCOCET) 10-325 MG tablet Take 1 tablet by mouth 2 (two) times daily as needed for pain. May refil on or after May 05 2016 60 tablet 0  . oxyCODONE-acetaminophen (PERCOCET) 10-325 MG  tablet Take 1 tablet by mouth daily as needed. May refill on or after July 03 2016 (Patient taking differently: Take 1 tablet by mouth daily as needed. Starting March 2018; May refill on or after July 03 2016) 30 tablet 0   No facility-administered medications prior to visit.     Review of Systems;  Patient denies headache, fevers, malaise, unintentional weight loss, skin rash, eye pain, sinus congestion and sinus pain, sore throat, dysphagia,  hemoptysis , cough, dyspnea, wheezing, chest pain, palpitations, orthopnea, edema, abdominal pain, nausea, melena, diarrhea, constipation, flank pain, dysuria, hematuria, urinary  Frequency, nocturia, numbness, tingling, seizures,  Focal weakness, Loss of consciousness,  Tremor, insomnia, depression, anxiety, and suicidal ideation.      Objective:  BP 110/62   Pulse 81   Temp 98.1 F (36.7 C) (Oral)   Resp 15   Ht  (1.702 m)   Wt (!) 313 lb 12.8 oz (142.3 kg)   SpO2 93%   BMI 49.15 kg/m    BP Readings from Last 3 Encounters:  06/26/16 110/62  05/24/16 127/81  05/20/16 (!) 148/88    Wt Readings from Last 3 Encounters:  06/26/16 (!) 313 lb 12.8 oz (142.3 kg)  05/24/16 (!) 301 lb 6.4 oz (136.7 kg)  05/20/16 (!) 305 lb (138.3 kg)    General appearance: alert, cooperative and appears stated age. Seated in wheelchair Neck: no adenopathy, no carotid bruit, supple, symmetrical, trachea midline and thyroid not enlarged, symmetric, no tenderness/mass/nodules Back: symmetric, no curvature. Restricted  ROM. No spinal or CVA tenderness.  Tender over SI joints bilaterally . Lungs: clear to auscultation bilaterally Heart: regular rate and rhythm, S1, S2 normal, no murmur, click, rub or gallop Abdomen: soft, non-tender; bowel sounds normal; no masses,  no organomegaly Pulses: 2+ and symmetric Skin: Skin color, texture, turgor normal. No rashes or lesions Lymph nodes: Cervical, supraclavicular, and axillary nodes normal.  Lab Results    Component Value Date   HGBA1C 6.7 06/26/2016   HGBA1C 7.1 (H) 03/27/2016   HGBA1C 6.4 12/04/2015    Lab Results  Component Value Date   CREATININE 0.71 04/09/2016   CREATININE 0.72 04/05/2016   CREATININE 0.81 03/27/2016    Lab Results  Component Value Date   WBC 8.4 04/09/2016   HGB 13.7 04/09/2016   HCT 41.6 04/09/2016   PLT 349.0 04/09/2016   GLUCOSE 114 (H) 04/09/2016   CHOL 129 03/27/2016   TRIG 130.0 03/27/2016   HDL 56.00 03/27/2016   LDLDIRECT 50.0 03/27/2016   LDLCALC 47 03/27/2016   ALT 18 05/24/2016   AST 24 05/24/2016   NA 136 04/09/2016   K 4.0 04/09/2016   CL 101 04/09/2016   CREATININE 0.71 04/09/2016   BUN 9 04/09/2016  CO2 25 04/09/2016   TSH 13.28 (H) 06/26/2016   INR 1.05 12/21/2015   HGBA1C 6.7 06/26/2016   MICROALBUR 1.4 08/01/2015    Ct Head Wo Contrast  Result Date: 04/05/2016 CLINICAL DATA:  Larey Seat out of the head, injury EXAM: CT HEAD WITHOUT CONTRAST TECHNIQUE: Contiguous axial images were obtained from the base of the skull through the vertex without intravenous contrast. COMPARISON:  None. FINDINGS: Brain: No acute territorial infarction or intracranial hemorrhage is visualized. There is no focal mass, mass effect or midline shift. 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 Ventricles nonenlarged. Mild to moderate atrophy. Vascular: No hyperdense vessels. Minimal carotid artery calcification Skull: Left inferior mastoid sclerosis.  No skull fracture Sinuses/Orbits: No acute finding. Other: None IMPRESSION: 1. No CT evidence for acute intracranial abnormality 2. Old appearing encephalomalacia involving the left inferior frontal lobe and right temporal lobe Electronically Signed   By: Jasmine Pang M.D.   On: 04/05/2016 23:38   US Abdomen Limited Ruq  Result Date: 04/06/2016 CLINICAL DATA:  Elevated liver function studies. History of chronic kidney disease, diabetes, hypertension, hyperlipidemia,  gastroesophageal reflux disease, and obesity. EXAM: US ABDOMEN LIMITED - RIGHT UPPER QUADRANT COMPARISON:  None. FINDINGS: Gallbladder: Small stones and sludge layering in the gallbladder, largest measuring 6 mm. Borderline gallbladder wall thickening. Murphy's sign is negative. Common bile duct: Diameter: 5.3 mm, normal Liver: Poor penetration possibly due to body habitus. Increased liver echotexture is suggestive of diffuse fatty infiltration. No focal lesions identified. IMPRESSION: Cholelithiasis with mild gallbladder wall thickening. Murphy's sign is negative. Changes are nonspecific but could indicate acute cholecystitis in the appropriate clinical setting. Diffuse fatty infiltration of the liver. Electronically Signed   By: Burman Nieves M.D.   On: 04/06/2016 01:59    Assessment & Plan:   Problem List Items Addressed This Visit    Acquired hypothyroidism    Overactive in January due to patient error in administration.  Has been taking 200 mcg daily since January, and is now underactive.  Dose increased to 225 mcg daily.  repeat tsh in 6 weeks.  I suspect she has been missing doses but denies it.   Lab Results  Component Value Date   TSH 13.28 (H) 06/26/2016         Relevant Orders   TSH (Completed)   Anxiety, generalized    Now managed with zoloft only.  weaning cymbalta to off over the next 2 weeks       Cholelithiasis    Asymptomatic, found during ultrasound evaluation for elevated liver enzymes .  GI referral has been made.       Chronic low back pain with right-sided sciatica    And SI joint pain precluding tapering of oxycodone until she can receive and injection.  Refills for oxycodone give for 3 months,  Advised to reduce celebrex to once daily  Refill history confirmed via [place] Controlled Substance databas, accessed by me today..      Relevant Medications   oxyCODONE-acetaminophen (PERCOCET) 10-325 MG tablet   oxyCODONE-acetaminophen (PERCOCET) 10-325 MG tablet    Diabetes mellitus type 2, controlled (HCC) - Primary    Improved control.. Continue victoza, zetia, and resume aspirin . ACE/ARB contraindicated due to hyperkalemia.    Lab Results  Component Value Date   HGBA1C 6.7 06/26/2016   Lab Results  Component Value Date   MICROALBUR 1.4 08/01/2015   Lab Results  Component Value Date   CREATININE 0.71 04/09/2016  Fatty liver    Presumed by ultrasound changes and serologies negative for autoimmune causes of hepatitis.  Current liver enzymes are normal and all modifiable risk factors including obesity, diabetes and hyperlipidemia have been addressed . 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  Residual deficits include recurrent vertigo and mild cognitive deficits resulting in mistakes in medication administration.  Daughter is advised to more closely supervise her medications       Morbid obesity (HCC)    Secondary to inactivity due to  DJD leading to sedentary lifestyle.  10 lb weight gain addressed with diet and need for increased activity        A total of 40 minutes was spent with patient more than half of which was spent in counseling patient on the above mentioned issues , reviewing and explaining recent labs and imaging studies done, and coordination of care.  I have discontinued Ms. Daleo's liraglutide. I have also changed her oxyCODONE-acetaminophen. Additionally, I am having her maintain her pantoprazole, amLODipine, DULoxetine, acetaminophen, gabapentin, polyethylene glycol, cycloSPORINE, rosuvastatin, tolterodine, sertraline, ergocalciferol, LUMIGAN, aspirin EC, ezetimibe, glucose blood, and oxyCODONE-acetaminophen.  Meds ordered this encounter  Medications  . DISCONTD: oxyCODONE-acetaminophen (PERCOCET) 10-325 MG tablet    Sig: Take 1 tablet by mouth every 8 (eight) hours as needed for  pain. NOTE DOSE INCREASE TO 3 TIMES DAILY AS NEEDED MAY REFILL ON OR AFTER July 02 2016    Dispense:  60 tablet    Refill:  0  . DISCONTD: oxyCODONE-acetaminophen (PERCOCET) 10-325 MG tablet    Sig: Take 1 tablet by mouth 2 (two) times daily as needed. May refill on or after Aug 01 2016    Dispense:  60 tablet    Refill:  0  . oxyCODONE-acetaminophen (PERCOCET) 10-325 MG tablet    Sig: Take 1 tablet by mouth daily as needed. May refill on or after September 01 2016    Dispense:  30 tablet    Refill:  0  . oxyCODONE-acetaminophen (PERCOCET) 10-325 MG tablet    Sig: Take 1 tablet by mouth every 8 (eight) hours as needed for pain. NOTE DOSE INCREASE TO 3 TIMES DAILY AS NEEDED MAY REFILL ON OR AFTER July 02 2016    Dispense:  90 tablet    Refill:  0  . DISCONTD: liraglutide 18 MG/3ML SOPN    Sig: Inject 0.1 mLs (0.6 mg total) into the skin daily.    Dispense:  9 mL    Refill:  5    Medications Discontinued During This Encounter  Medication Reason  . oxyCODONE-acetaminophen (PERCOCET) 10-325 MG tablet Reorder  . oxyCODONE-acetaminophen (PERCOCET) 10-325 MG tablet Reorder  . oxyCODONE-acetaminophen (PERCOCET) 10-325 MG tablet Reorder  . oxyCODONE-acetaminophen (PERCOCET) 10-325 MG tablet Reorder  . Liraglutide 18 MG/3ML SOPN Reorder    Follow-up: No Follow-up on file.   Sherlene Shams, MD

## 2016-06-26 NOTE — Patient Instructions (Addendum)
Percocet refills:  April:  #90 (max 3 daily) May #60 (max  2 daily) June #30 (max once daily)  To wean off Cymbalta   Reduce dose to once daily for  one week  Then once every other day for one week,  Then stop   If you feel bad when you switch to every other day, continue once daily for another week,  Then reduce to every other day for a week , then stop    WE ARE RECHECKING YOUR  THYROID LEVEL TODAY, IT WAS OVERACTIVE IN January

## 2016-06-27 ENCOUNTER — Other Ambulatory Visit: Payer: Self-pay

## 2016-06-27 ENCOUNTER — Telehealth: Payer: Self-pay

## 2016-06-27 MED ORDER — LEVOTHYROXINE SODIUM 25 MCG PO TABS: 25.0000 ug | ORAL_TABLET | Freq: Every day | ORAL | 1 refills | Status: DC

## 2016-06-27 MED ORDER — LEVOTHYROXINE SODIUM 200 MCG PO TABS: 200.0000 ug | ORAL_TABLET | Freq: Every day | ORAL | 1 refills | Status: DC

## 2016-06-27 MED ORDER — CELECOXIB 200 MG PO CAPS: 200.0000 mg | ORAL_CAPSULE | Freq: Every day | ORAL | 1 refills | Status: DC

## 2016-06-27 NOTE — Telephone Encounter (Signed)
-----   Message from Sherlene Shams, MD sent at 06/27/2016  2:52 PM EDT ----- Your thyroid is now underactive.  If you can confirm that you have been taking it correctly I will increase the dose    Regards,   Duncan Dull, MD

## 2016-06-27 NOTE — Addendum Note (Signed)
Addended by: Sherlene Shams on: 06/27/2016 04:47 PM   Modules accepted: Orders

## 2016-06-27 NOTE — Telephone Encounter (Addendum)
I will increase her dose to 225 mcg daily and send new rx for the 25 additional mcg to mail order.  It does not coe in a 225 mcg tablet so she will have to take a 200 plus a 25 mcg tablet daily  She can continue her old dose  Until then  The MAXIMUM dose of celebrex IS 200 MG DAILY FOR HER AGE, 200 MG DAILY NOT TWICE DAILY  And I will send a refill to Mail order for that as well

## 2016-06-27 NOTE — Telephone Encounter (Signed)
Spoke with pt and she stated that she has been taking her thyroid medication as directed. Pt also stated that she was supposed to call you back and let you know what dose of celebrex she is taking. She stated she is taking  Bid.

## 2016-06-28 ENCOUNTER — Telehealth: Payer: Self-pay | Admitting: *Deleted

## 2016-06-28 MED ORDER — LIRAGLUTIDE 18 MG/3ML ~~LOC~~ SOPN: 0.6000 mg | PEN_INJECTOR | Freq: Every day | SUBCUTANEOUS | 0 refills | Status: DC

## 2016-06-28 NOTE — Telephone Encounter (Signed)
Script sent filled

## 2016-06-28 NOTE — Telephone Encounter (Signed)
Spoke with pt and informed her of the medication changes. Pt gave a verbal understanding.

## 2016-06-28 NOTE — Telephone Encounter (Signed)
Patient has requested to have the Victo[facility] Rx [facility] for a 90 day supply. Pt contact [phone]

## 2016-06-28 NOTE — Telephone Encounter (Signed)
Yes, ok to do thanks

## 2016-06-28 NOTE — Telephone Encounter (Signed)
Is it ok to do this?

## 2016-06-28 NOTE — Addendum Note (Signed)
Addended by: Alisia Ferrari on: 06/28/2016 04:52 PM   Modules accepted: Orders

## 2016-06-29 DIAGNOSIS — K8020 Calculus of gallbladder without cholecystitis without obstruction: Secondary | ICD-10-CM | POA: Insufficient documentation

## 2016-06-29 NOTE — Assessment & Plan Note (Signed)
Secondary to inactivity due to  DJD leading to sedentary lifestyle.  10 lb weight gain addressed with diet and need for increased activity

## 2016-06-29 NOTE — Assessment & Plan Note (Signed)
Presumed by ultrasound changes and serologies negative for autoimmune causes of hepatitis.  Current liver enzymes are normal and all modifiable risk factors including obesity, diabetes and hyperlipidemia have been addressed . needs Hepatitis A and b vaccines started

## 2016-06-29 NOTE — Assessment & Plan Note (Addendum)
Now managed with zoloft only.  weaning cymbalta to off over the next 2 weeks

## 2016-06-29 NOTE — Assessment & Plan Note (Signed)
Overactive in January due to patient error in administration.  Has been taking 200 mcg daily since January, and is now underactive.  Dose increased to 225 mcg daily.  repeat tsh in 6 weeks.  I suspect she has been missing doses but denies it.   Lab Results  Component Value Date   TSH 13.28 (H) 06/26/2016

## 2016-06-29 NOTE — Assessment & Plan Note (Addendum)
Asymptomatic, found during ultrasound evaluation for elevated liver enzymes .  GI referral has been made.

## 2016-06-29 NOTE — Assessment & Plan Note (Signed)
And SI joint pain precluding tapering of oxycodone until she can receive and injection.  Refills for oxycodone give for 3 months,  Advised to reduce celebrex to once daily  Refill history confirmed via [place] Controlled Substance databas, accessed by me today.Marland Kitchen

## 2016-06-29 NOTE — Assessment & Plan Note (Signed)
Improved control.. Continue victoza, zetia, and resume aspirin . ACE/ARB contraindicated due to hyperkalemia.    Lab Results  Component Value Date   HGBA1C 6.7 06/26/2016   Lab Results  Component Value Date   MICROALBUR 1.4 08/01/2015   Lab Results  Component Value Date   CREATININE 0.71 04/09/2016

## 2016-06-29 NOTE — Assessment & Plan Note (Signed)
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  Residual deficits include recurrent vertigo and mild cognitive deficits resulting in mistakes in medication administration.  Daughter is advised to more closely supervise her medications

## 2016-07-02 ENCOUNTER — Ambulatory Visit: Payer: Medicare (Managed Care) | Admitting: Gastroenterology

## 2016-07-04 ENCOUNTER — Ambulatory Visit: Payer: Medicare Other | Attending: Family Medicine | Admitting: Physical Therapy

## 2016-07-04 DIAGNOSIS — H8111 Benign paroxysmal vertigo, right ear: Secondary | ICD-10-CM | POA: Diagnosis present

## 2016-07-05 NOTE — Therapy (Signed)
[facility] [address] [place], [place], [address] Phone: [phone]   Fax:  [phone]  Physical Therapy Treatment  Patient Details  Name: Bonnie Hinton MRN: [id number] Date of Birth: December 01, 1940 Referring Provider: Thersa Salt, DO  Encounter Date: 07/04/2016      PT End of Session - 07/05/16 1435    Visit Number 4   Number of Visits 9   Date for PT Re-Evaluation 07/12/16   Authorization Type BCBS Medicare   PT Start Time 1020   PT Stop Time 1100   PT Time Calculation (min) 40 min      Past Medical History:  Diagnosis Date  . Anxiety   . Arthritis   . Chronic back pain    stenosis and arthritis  . Complication of anesthesia    anxious when she wakes up  . GERD (gastroesophageal reflux disease)    takes Pantoprazole daily  . Glaucoma    dry angle  . History of bronchitis    30+ yrs ago  . History of MRSA infection 2004   several yrs ago  . Hyperlipidemia    takes Zetia and Crestor daily  . Hypertension    take Amlodipine daily  . Hypothyroidism    takes Synthroid daily  . Joint pain   . Joint swelling   . Neuropathy (HCC)    takes Gabapentin daily  . Peripheral edema    takes Furosemide daily as needed  . Pneumonia    hx of-30 + yrs ago  . Pre-diabetes    takes Victoza daily  . Primary localized osteoarthritis of right knee   . Subdural hematoma (HCC) 8 yrs ago   hx of  . Urinary frequency   . Urinary urgency     Past Surgical History:  Procedure Laterality Date  . ABDOMINAL HYSTERECTOMY    . COLONOSCOPY    . RADIOLOGY WITH ANESTHESIA N/A 08/22/2015   Procedure: MRI OF LUMBAR SPINE   (RADIOLOGY WITH ANESTHESIA);  Surgeon: Medication Radiologist, MD;  Location: [facility];  Service: Radiology;  Laterality: N/A;  . TOTAL HIP ARTHROPLASTY Right   . TOTAL HIP ARTHROPLASTY Left   . TOTAL KNEE ARTHROPLASTY Left   . TOTAL KNEE ARTHROPLASTY Right 01/01/2016   Procedure: TOTAL KNEE  ARTHROPLASTY;  Surgeon: Elsie Saas, MD;  Location: [facility];  Service: Orthopedics;  Laterality: Right;    There were no vitals filed for this visit.      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 Yes   Pain Score 8    Pain Location Coccyx   Pain Orientation Mid   Pain Descriptors / Indicators Throbbing;Aching;Grimacing   Pain Type Acute pain   Pain Onset 1 to 4 weeks ago   Pain Frequency Constant         Neuro Re-ed;  Rt horizontal roll test (-) for nystagmus and c/o vertigo;  Lt horizontal roll test (-) also for c/o vertigo and nystagmus  Rt and Lt Dix-Hallpike tests negative for nystagmus and c/o vertigo  Pt able to transfer sit to supine with min assist for LE transfer onto mat due to onset of  pain in coccyx region  Pt rolled supine to Rt sidelying with no c/o vertigo and then to Lt sidelying position without any c/o vertigo  Self Care:  Discussed LTG's and progress with pt - all goals met and BPPV is resolved at this time                             PT Long Term Goals - 07/05/16 1438      PT LONG TERM GOAL #1   Title Pt will have a negative Rt horizontal roll test to indicate resolution of Rt horizontal canal BPPV.  07-12-16   Baseline met 2016-07-26   Status Achieved     PT LONG TERM GOAL #2   Title Pt will report no vertigo with bed mobility. 07-12-16   Baseline met 07-26-2016   Status Achieved     PT LONG TERM GOAL #3   Title Independent in HEP for habituation exercises.  07-12-16   Baseline met 2016-07-26   Status Achieved     PT LONG TERM GOAL #4   Title Pt will report ability to perform household amb. with RW without experiencing vertigo.  07-12-16   Baseline  met Jul 26, 2016   Status Achieved               Plan - 07/05/16 1436    Clinical Impression Statement Pt has met all LTG's; horizontal canal BPPV has resolved at this time; no c/o vertigo with mobility, however, limiting factor now is c/o pain in sacral/coccyx region - pt states she has upcoming appt with ortho MD   Rehab Potential Good   PT Frequency 2x / week   PT Duration 4 weeks   PT Treatment/Interventions ADLs/Self Care Home Management;Canalith Repostioning;Gait training;Functional mobility training;Patient/family education;Neuromuscular re-education;Therapeutic exercise;Therapeutic activities;Balance training;Vestibular   PT Next Visit Plan N/A - D/C   Consulted and Agree with Plan of Care Patient;Other (Comment)   Family Member Consulted CNA      Patient will benefit from skilled therapeutic intervention in order to improve the following deficits and impairments:  Dizziness, Decreased mobility, Difficulty walking  Visit Diagnosis: BPPV (benign paroxysmal positional vertigo), right       G-Codes - 07-26-2016 1441    Functional Assessment Tool Used (Outpatient Only) BPPV has resolved   Functional Limitation Changing and maintaining body position   Changing and Maintaining Body Position Goal Status (X7262) At least 1 percent but less than 20 percent impaired, limited or restricted   Changing and Maintaining Body Position Discharge Status (M3559) At least 1 percent but less than 20 percent impaired, limited or restricted      Problem List Patient Active Problem List   Diagnosis Date Noted  . Cholelithiasis 06/29/2016  . Dizziness 05/24/2016  . Fatty liver 04/09/2016  . Anxiety, generalized 02/20/2016  . Leukocytosis 01/11/2016  . Arthritis   . Acquired hypothyroidism   . History of CVA (cerebrovascular accident)   . Status post bilateral hip replacements   . Status post left knee replacement   . Chronic low back pain with right-sided sciatica   . Essential  hypertension, benign 07/25/2015  . Preoperative clearance 07/25/2015  . Hyperlipidemia 07/25/2015  . Urinary urgency 04/26/2015  . Diabetes mellitus type 2, controlled ([facility]) 01/24/2015  . Morbid obesity ([facility]) 01/24/2015  . Vitamin D deficiency 01/24/2015  . 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, right 09/23/2014     PHYSICAL THERAPY DISCHARGE SUMMARY  Visits from  Start of Care: 4  Current functional level related to goals / functional outcomes: All LTG's met - BPPV appears to be resolved at this time as pt reports no vertigo with positional testing nor with bed mobility   Remaining deficits: None regarding vertigo Pt is now c/o pain in sacral/coccyx region of recent onset:  This pain is limiting ease and independence with transitional movements, bed mobility and with transfers:   States she has appt with her orthopedic doctor in 2 weeks   Education / Equipment: Pt was instructed in HEP for habituation of vertigo, which now appears to be resolved. Plan: Patient agrees to discharge.  Patient goals were met. Patient is being discharged due to meeting the stated rehab goals.  ?????       Alda Lea, PT 07/05/2016, 2:43 PM  [facility] [address] [place], [place], [address] Phone: [phone]   Fax:  [phone]  Name: Bonnie Hinton MRN: [id number] Date of Birth: [birth year]

## 2016-07-23 ENCOUNTER — Other Ambulatory Visit: Payer: Self-pay

## 2016-07-23 ENCOUNTER — Ambulatory Visit (INDEPENDENT_AMBULATORY_CARE_PROVIDER_SITE_OTHER): Payer: Medicare Other | Admitting: Gastroenterology

## 2016-07-23 ENCOUNTER — Encounter: Payer: Self-pay | Admitting: Gastroenterology

## 2016-07-23 VITALS — BP 161/92 | HR 102 | Temp 98.0°F | Ht 67.0 in | Wt 302.6 lb

## 2016-07-23 DIAGNOSIS — K8020 Calculus of gallbladder without cholecystitis without obstruction: Secondary | ICD-10-CM

## 2016-07-23 DIAGNOSIS — R945 Abnormal results of liver function studies: Secondary | ICD-10-CM | POA: Diagnosis not present

## 2016-07-23 DIAGNOSIS — R7989 Other specified abnormal findings of blood chemistry: Secondary | ICD-10-CM

## 2016-07-23 NOTE — Progress Notes (Signed)
Primary Care Physician: Crecencio Mc, MD  Primary Gastroenterologist:  Dr. Jonathon Bellows   Chief Complaint  Patient presents with  . Follow-up    HPI: Bonnie Hinton is a 76 y.o. female  She is here today to follow up for abnormal LFT's.    Summary of history : LFT's were normal in 12/2015, 03/2016 .   Labs 03/2016,TSH-normal ,Ecoli UTI in 03/2016 -treated with Keflex for 10 days ,CBC-normal   Abnormal tests first noted 04/05/16 ( when she visited Er with a fall ) with AST 257, ALT 122, Alk phos 154 Acute hepatitis panel , smooth muscle antibody ,AMA was negative. ANA was positive 1:40  TSH- low  Repeat LFT's 04/09/16 had almost normalized except for alkaline phosphatase.  RUQ USG showed chololithiasis with mild gall bladder wall thickening , negative murphys sign.   She says that a few days prior to the episode she had some upper abdominal pain, very mild , didn't have any apetite , describes the discomfort more of a fullness, stomach just didn't feel right. Denies similar episodes in the past . She cant recall any family members with history of gall stones  Interval history   05/20/2016-  07/23/2016    Labs 05/24/16 - LFT's normal ,GGT-normal  No abdominal pain since , no GI issues. Here to discuss the results  Current Outpatient Prescriptions  Medication Sig Dispense Refill  . acetaminophen (TYLENOL) 325 MG tablet Take 2 tablets (650 mg total) by mouth every 6 (six) hours as needed for mild pain (or Fever >/= 101).    Marland Kitchen amLODipine (NORVASC) 5 MG tablet Take 1 tablet (5 mg total) by mouth daily. 90 tablet 3  . aspirin EC 81 MG tablet Take 81 mg by mouth daily.    . celecoxib (CELEBREX) 200 MG capsule Take 1 capsule (200 mg total) by mouth daily. 90 capsule 1  . cycloSPORINE (RESTASIS) 0.05 % ophthalmic emulsion Place 1 drop into both eyes 2 (two) times daily.    . ergocalciferol (DRISDOL) 50000 units capsule Take 1 capsule (50,000 Units total) by mouth once a week. 4  capsule 3  . ezetimibe (ZETIA) 10 MG tablet TAKE 1 TABLET BY MOUTH EVERY DAY 30 tablet 0  . gabapentin (NEURONTIN) 300 MG capsule Take 1 capsule (300 mg total) by mouth 2 (two) times daily. 60 capsule 0  . glucose blood (ONETOUCH VERIO) test strip USE TO TEST BLOOD SUGAR ONCE DAILY 100 each 12  . levothyroxine (SYNTHROID, LEVOTHROID) 200 MCG tablet Take 1 tablet (200 mcg total) by mouth daily before breakfast. With 25 mcg daily  (total daily dose 225 mcg) 90 tablet 1  . levothyroxine (SYNTHROID, LEVOTHROID) 25 MCG tablet Take 1 tablet (25 mcg total) by mouth daily before breakfast. With the 200 mcg tablet 90 tablet 1  . LUMIGAN 0.01 % SOLN Place 1-2 drops into both eyes at bedtime.    Marland Kitchen oxyCODONE-acetaminophen (PERCOCET) 10-325 MG tablet Take 1 tablet by mouth daily as needed. May refill on or after September 01 2016 30 tablet 0  . pantoprazole (PROTONIX) 40 MG tablet Take 1 tablet (40 mg total) by mouth daily. 90 tablet 3  . rosuvastatin (CRESTOR) 20 MG tablet TAKE 1 TABLET BY MOUTH DAILY 30 tablet 3  . sertraline (ZOLOFT) 50 MG tablet Take 1.5 tablets (75 mg total) by mouth daily. 135 tablet 1  . tolterodine (DETROL LA) 4 MG 24 hr capsule TAKE 1 CAPSULE BY MOUTH TWICE DAILY 60 capsule 3  .  DULoxetine (CYMBALTA) 30 MG capsule Take 30 mg by mouth 2 (two) times daily.    Marland Kitchen liraglutide 18 MG/3ML SOPN Inject 0.1 mLs (0.6 mg total) into the skin daily. (Patient not taking: Reported on 07/23/2016) 9 mL 0  . LORazepam (ATIVAN) 0.5 MG tablet     . polyethylene glycol (MIRALAX / GLYCOLAX) packet 17grams in 6 oz of water twice a day until bowel movement.  LAXITIVE.  Restart if two days since last bowel movement (Patient not taking: Reported on 07/23/2016) 14 each 0   No current facility-administered medications for this visit.     Allergies as of 07/23/2016 - Review Complete 07/23/2016  Allergen Reaction Noted  . Erythromycin Rash 01/23/2015  . Iodinated diagnostic agents Swelling and Rash 01/23/2015  .  Fentanyl Other (See Comments) 01/23/2015  . Latex Itching and Rash 01/23/2015  . Lisinopril Other (See Comments) 05/27/2015    ROS:  General: Negative for anorexia, weight loss, fever, chills, fatigue, weakness. ENT: Negative for hoarseness, difficulty swallowing , nasal congestion. CV: Negative for chest pain, angina, palpitations, dyspnea on exertion, peripheral edema.  Respiratory: Negative for dyspnea at rest, dyspnea on exertion, cough, sputum, wheezing.  GI: See history of present illness. GU:  Negative for dysuria, hematuria, urinary incontinence, urinary frequency, nocturnal urination.  Endo: Negative for unusual weight change.    Physical Examination:   BP (!) 161/92 (BP Location: Right Arm, Patient Position: Sitting, Cuff Size: Large)   Pulse (!) 102   Temp 98 F (36.7 C) (Oral)   Ht _0  (1.702 m)   Wt (!) 302 lb 9.6 oz (137.3 kg)   BMI 47.39 kg/m   General: Well-nourished, well-developed in no acute distress.  Eyes: No icterus. Conjunctivae pink. Mouth: Oropharyngeal mucosa moist and pink , no lesions erythema or exudate. Lungs: Clear to auscultation bilaterally. Non-labored. Heart: Regular rate and rhythm, no murmurs rubs or gallops.  Abdomen: Bowel sounds are normal, nontender, nondistended, no hepatosplenomegaly or masses, no abdominal bruits or hernia , no rebound or guarding.   Extremities: No lower extremity edema. No clubbing or deformities. Neuro: Alert and oriented x 3.  Grossly intact. Skin: Warm and dry, no jaundice.   Psych: Alert and cooperative, normal mood and affect.  *  Imaging Studies: No results found.  Assessment and Plan:   Bonnie Hinton is a 76 y.o. y/o female *here to follow up for abnormal LFT's which have resolved. Explained the nature of her pain and pattern of LFT's suggests she likely passed a stone but cannot be sure of the same. A similar picture could occur in a viral illness. I said she is at a  higher risk for gall stone  issues due to her age, sex, BMI. I strongly suggested a surgical referral to have a discussion about a cholecystectomy. She was not at all keen. She is aware of the risk of pancreatitis, cholangitis . She said she will watch for signs of biliary colic and if it recurs will call my office asap to get a surgical referral.    Dr Jonathon Bellows  MD Follow up as needed

## 2016-07-26 ENCOUNTER — Other Ambulatory Visit: Payer: Self-pay | Admitting: Internal Medicine

## 2016-08-05 ENCOUNTER — Ambulatory Visit (INDEPENDENT_AMBULATORY_CARE_PROVIDER_SITE_OTHER): Payer: Medicare Other | Admitting: Podiatry

## 2016-08-05 DIAGNOSIS — M79676 Pain in unspecified toe(s): Secondary | ICD-10-CM | POA: Diagnosis not present

## 2016-08-05 DIAGNOSIS — E1142 Type 2 diabetes mellitus with diabetic polyneuropathy: Secondary | ICD-10-CM | POA: Diagnosis not present

## 2016-08-05 DIAGNOSIS — B351 Tinea unguium: Secondary | ICD-10-CM | POA: Diagnosis not present

## 2016-08-05 NOTE — Progress Notes (Signed)
Subjective:     Patient ID: Bonnie GashElizabeth A Hinton, female   DOB: 08-Dec-1940, 76 y.o.   MRN: [id number]  HPI this patient presents the office with chief complaint of long painful ingrowing toenails on both big toes. Patient states the nails are painful walking and wearing her shoes. She says she has injured her toenails in the bathroom and they have turned black.  This patient is diabetic with neuropathy and is treated with gabapentin. She presents the office today for an evaluation and treatment of her painful big toenails   Review of Systems     Objective:   Physical Exam GENERAL APPEARANCE: Alert, conversant. Appropriately groomed. No acute distress.  VASCULAR: Pedal pulses are  palpable at  [facility] and PT bilateral.  Capillary refill time is immediate to all digits,  Normal temperature gradient.  Digital hair growth is present bilateral  NEUROLOGIC: sensation is normal to 5.07 monofilament at 5/5 sites bilateral.  Light touch is intact bilateral, Muscle strength normal.  MUSCULOSKELETAL: acceptable muscle strength, tone and stability bilateral.  Intrinsic muscluature intact bilateral.  Contracted digits  B/L.  Severe PTTD left foot.  DERMATOLOGIC: skin color, texture, and turgor are within normal limits.  No preulcerative lesions or ulcers  are seen, no interdigital maceration noted.  No open lesions present.   No drainage noted. NAILS  Thick incurvated hallux toenails both feet.      Assessment:     Onychomycosis  B/L.  Diabetes with neuropathy    Plan:     IE  Debride nails  RTC 3 months   Helane GuntherGregory Derrell Milanes DPM    .

## 2016-08-10 ENCOUNTER — Other Ambulatory Visit: Payer: Self-pay | Admitting: Internal Medicine

## 2016-08-23 ENCOUNTER — Other Ambulatory Visit: Payer: Self-pay | Admitting: Internal Medicine

## 2016-09-04 ENCOUNTER — Emergency Department (HOSPITAL_COMMUNITY): Payer: Medicare Other

## 2016-09-04 ENCOUNTER — Telehealth: Payer: Self-pay | Admitting: *Deleted

## 2016-09-04 ENCOUNTER — Inpatient Hospital Stay (HOSPITAL_COMMUNITY): Payer: Medicare Other

## 2016-09-04 ENCOUNTER — Encounter (HOSPITAL_COMMUNITY): Payer: Self-pay | Admitting: Emergency Medicine

## 2016-09-04 ENCOUNTER — Inpatient Hospital Stay (HOSPITAL_COMMUNITY)
Admission: EM | Admit: 2016-09-04 | Discharge: 2016-09-14 | DRG: 417 | Disposition: A | Discharge status: Skilled Nursing Facility | Payer: Medicare Other | Attending: Family Medicine | Admitting: Family Medicine

## 2016-09-04 DIAGNOSIS — E119 Type 2 diabetes mellitus without complications: Secondary | ICD-10-CM | POA: Diagnosis present

## 2016-09-04 DIAGNOSIS — K851 Biliary acute pancreatitis without necrosis or infection: Secondary | ICD-10-CM | POA: Diagnosis present

## 2016-09-04 DIAGNOSIS — K5900 Constipation, unspecified: Secondary | ICD-10-CM | POA: Diagnosis present

## 2016-09-04 DIAGNOSIS — Z8673 Personal history of transient ischemic attack (TIA), and cerebral infarction without residual deficits: Secondary | ICD-10-CM

## 2016-09-04 DIAGNOSIS — K8309 Other cholangitis: Secondary | ICD-10-CM

## 2016-09-04 DIAGNOSIS — K8500 Idiopathic acute pancreatitis without necrosis or infection: Secondary | ICD-10-CM

## 2016-09-04 DIAGNOSIS — Z6841 Body Mass Index (BMI) 40.0 and over, adult: Secondary | ICD-10-CM | POA: Diagnosis not present

## 2016-09-04 DIAGNOSIS — Z96653 Presence of artificial knee joint, bilateral: Secondary | ICD-10-CM | POA: Diagnosis present

## 2016-09-04 DIAGNOSIS — R600 Localized edema: Secondary | ICD-10-CM | POA: Diagnosis present

## 2016-09-04 DIAGNOSIS — K219 Gastro-esophageal reflux disease without esophagitis: Secondary | ICD-10-CM | POA: Diagnosis present

## 2016-09-04 DIAGNOSIS — R109 Unspecified abdominal pain: Secondary | ICD-10-CM

## 2016-09-04 DIAGNOSIS — N179 Acute kidney failure, unspecified: Secondary | ICD-10-CM | POA: Diagnosis present

## 2016-09-04 DIAGNOSIS — D72829 Elevated white blood cell count, unspecified: Secondary | ICD-10-CM | POA: Diagnosis present

## 2016-09-04 DIAGNOSIS — Z833 Family history of diabetes mellitus: Secondary | ICD-10-CM

## 2016-09-04 DIAGNOSIS — F40240 Claustrophobia: Secondary | ICD-10-CM | POA: Diagnosis present

## 2016-09-04 DIAGNOSIS — E876 Hypokalemia: Secondary | ICD-10-CM | POA: Diagnosis present

## 2016-09-04 DIAGNOSIS — Z91040 Latex allergy status: Secondary | ICD-10-CM

## 2016-09-04 DIAGNOSIS — R197 Diarrhea, unspecified: Secondary | ICD-10-CM | POA: Diagnosis present

## 2016-09-04 DIAGNOSIS — K8000 Calculus of gallbladder with acute cholecystitis without obstruction: Principal | ICD-10-CM | POA: Diagnosis present

## 2016-09-04 DIAGNOSIS — Z8614 Personal history of Methicillin resistant Staphylococcus aureus infection: Secondary | ICD-10-CM | POA: Diagnosis not present

## 2016-09-04 DIAGNOSIS — E778 Other disorders of glycoprotein metabolism: Secondary | ICD-10-CM | POA: Diagnosis present

## 2016-09-04 DIAGNOSIS — R7989 Other specified abnormal findings of blood chemistry: Secondary | ICD-10-CM

## 2016-09-04 DIAGNOSIS — E039 Hypothyroidism, unspecified: Secondary | ICD-10-CM | POA: Diagnosis present

## 2016-09-04 DIAGNOSIS — R0602 Shortness of breath: Secondary | ICD-10-CM

## 2016-09-04 DIAGNOSIS — Z79899 Other long term (current) drug therapy: Secondary | ICD-10-CM

## 2016-09-04 DIAGNOSIS — E559 Vitamin D deficiency, unspecified: Secondary | ICD-10-CM | POA: Diagnosis present

## 2016-09-04 DIAGNOSIS — Z96643 Presence of artificial hip joint, bilateral: Secondary | ICD-10-CM | POA: Diagnosis present

## 2016-09-04 DIAGNOSIS — Z91041 Radiographic dye allergy status: Secondary | ICD-10-CM

## 2016-09-04 DIAGNOSIS — F411 Generalized anxiety disorder: Secondary | ICD-10-CM | POA: Diagnosis present

## 2016-09-04 DIAGNOSIS — H409 Unspecified glaucoma: Secondary | ICD-10-CM | POA: Diagnosis present

## 2016-09-04 DIAGNOSIS — G8929 Other chronic pain: Secondary | ICD-10-CM | POA: Diagnosis present

## 2016-09-04 DIAGNOSIS — K8050 Calculus of bile duct without cholangitis or cholecystitis without obstruction: Secondary | ICD-10-CM

## 2016-09-04 DIAGNOSIS — M48061 Spinal stenosis, lumbar region without neurogenic claudication: Secondary | ICD-10-CM | POA: Diagnosis present

## 2016-09-04 DIAGNOSIS — G629 Polyneuropathy, unspecified: Secondary | ICD-10-CM | POA: Diagnosis present

## 2016-09-04 DIAGNOSIS — K7200 Acute and subacute hepatic failure without coma: Secondary | ICD-10-CM | POA: Diagnosis present

## 2016-09-04 DIAGNOSIS — Z885 Allergy status to narcotic agent status: Secondary | ICD-10-CM

## 2016-09-04 DIAGNOSIS — Z881 Allergy status to other antibiotic agents status: Secondary | ICD-10-CM

## 2016-09-04 DIAGNOSIS — E1169 Type 2 diabetes mellitus with other specified complication: Secondary | ICD-10-CM | POA: Diagnosis present

## 2016-09-04 DIAGNOSIS — K760 Fatty (change of) liver, not elsewhere classified: Secondary | ICD-10-CM | POA: Diagnosis present

## 2016-09-04 DIAGNOSIS — I10 Essential (primary) hypertension: Secondary | ICD-10-CM | POA: Diagnosis present

## 2016-09-04 DIAGNOSIS — E118 Type 2 diabetes mellitus with unspecified complications: Secondary | ICD-10-CM | POA: Diagnosis not present

## 2016-09-04 DIAGNOSIS — Z90710 Acquired absence of both cervix and uterus: Secondary | ICD-10-CM

## 2016-09-04 DIAGNOSIS — M5441 Lumbago with sciatica, right side: Secondary | ICD-10-CM | POA: Diagnosis present

## 2016-09-04 DIAGNOSIS — R1011 Right upper quadrant pain: Secondary | ICD-10-CM

## 2016-09-04 DIAGNOSIS — Z9889 Other specified postprocedural states: Secondary | ICD-10-CM | POA: Diagnosis present

## 2016-09-04 DIAGNOSIS — Z01811 Encounter for preprocedural respiratory examination: Secondary | ICD-10-CM

## 2016-09-04 DIAGNOSIS — Z7982 Long term (current) use of aspirin: Secondary | ICD-10-CM

## 2016-09-04 DIAGNOSIS — E1121 Type 2 diabetes mellitus with diabetic nephropathy: Secondary | ICD-10-CM

## 2016-09-04 DIAGNOSIS — E785 Hyperlipidemia, unspecified: Secondary | ICD-10-CM | POA: Diagnosis present

## 2016-09-04 DIAGNOSIS — R945 Abnormal results of liver function studies: Secondary | ICD-10-CM

## 2016-09-04 DIAGNOSIS — R000 Tachycardia, unspecified: Secondary | ICD-10-CM | POA: Diagnosis present

## 2016-09-04 DIAGNOSIS — Z888 Allergy status to other drugs, medicaments and biological substances status: Secondary | ICD-10-CM

## 2016-09-04 DIAGNOSIS — K8020 Calculus of gallbladder without cholecystitis without obstruction: Secondary | ICD-10-CM | POA: Diagnosis present

## 2016-09-04 HISTORY — DX: Biliary acute pancreatitis without necrosis or infection: K85.10

## 2016-09-04 LAB — URINALYSIS, ROUTINE W REFLEX MICROSCOPIC
Glucose, UA: 50 mg/dL — AB
Ketones, ur: NEGATIVE mg/dL
Nitrite: NEGATIVE
Protein, ur: 100 mg/dL — AB
Specific Gravity, Urine: 1.02 (ref 1.005–1.030)
pH: 5 (ref 5.0–8.0)

## 2016-09-04 LAB — GLUCOSE, CAPILLARY
Glucose-Capillary: 177 mg/dL — ABNORMAL HIGH (ref 65–99)
Glucose-Capillary: 209 mg/dL — ABNORMAL HIGH (ref 65–99)

## 2016-09-04 LAB — CBC WITH DIFFERENTIAL/PLATELET
Basophils Absolute: 0 10*3/uL (ref 0.0–0.1)
Basophils Relative: 0 %
Eosinophils Absolute: 0 10*3/uL (ref 0.0–0.7)
Eosinophils Relative: 0 %
HCT: 43.4 % (ref 36.0–46.0)
Hemoglobin: 14.4 g/dL (ref 12.0–15.0)
Lymphocytes Relative: 2 %
Lymphs Abs: 0.3 10*3/uL — ABNORMAL LOW (ref 0.7–4.0)
MCH: 29.8 pg (ref 26.0–34.0)
MCHC: 33.2 g/dL (ref 30.0–36.0)
MCV: 89.9 fL (ref 78.0–100.0)
Monocytes Absolute: 0.8 10*3/uL (ref 0.1–1.0)
Monocytes Relative: 5 %
Neutro Abs: 15.7 10*3/uL — ABNORMAL HIGH (ref 1.7–7.7)
Neutrophils Relative %: 93 %
Platelets: 287 10*3/uL (ref 150–400)
RBC: 4.83 MIL/uL (ref 3.87–5.11)
RDW: 14.7 % (ref 11.5–15.5)
WBC: 16.8 10*3/uL — ABNORMAL HIGH (ref 4.0–10.5)

## 2016-09-04 LAB — COMPREHENSIVE METABOLIC PANEL
ALT: 867 U/L — ABNORMAL HIGH (ref 14–54)
AST: 859 U/L — ABNORMAL HIGH (ref 15–41)
Albumin: 3.3 g/dL — ABNORMAL LOW (ref 3.5–5.0)
Alkaline Phosphatase: 213 U/L — ABNORMAL HIGH (ref 38–126)
Anion gap: 20 — ABNORMAL HIGH (ref 5–15)
BUN: 26 mg/dL — ABNORMAL HIGH (ref 6–20)
CO2: 15 mmol/L — ABNORMAL LOW (ref 22–32)
Calcium: 8.1 mg/dL — ABNORMAL LOW (ref 8.9–10.3)
Chloride: 98 mmol/L — ABNORMAL LOW (ref 101–111)
Creatinine, Ser: 2.43 mg/dL — ABNORMAL HIGH (ref 0.44–1.00)
GFR calc Af Amer: 21 mL/min — ABNORMAL LOW (ref >60)
GFR calc non Af Amer: 18 mL/min — ABNORMAL LOW (ref >60)
Glucose, Bld: 184 mg/dL — ABNORMAL HIGH (ref 65–99)
Potassium: 3.2 mmol/L — ABNORMAL LOW (ref 3.5–5.1)
Sodium: 133 mmol/L — ABNORMAL LOW (ref 135–145)
Total Bilirubin: 4.7 mg/dL — ABNORMAL HIGH (ref 0.3–1.2)
Total Protein: 6.8 g/dL (ref 6.5–8.1)

## 2016-09-04 LAB — PROTIME-INR
INR: 1.37
Prothrombin Time: 17 seconds — ABNORMAL HIGH (ref 11.4–15.2)

## 2016-09-04 LAB — LIPASE, BLOOD: Lipase: 723 U/L — ABNORMAL HIGH (ref 11–51)

## 2016-09-04 LAB — LIPID PANEL
Cholesterol: 113 mg/dL (ref 0–200)
HDL: 71 mg/dL (ref >40)
LDL Cholesterol: 19 mg/dL (ref 0–99)
Total CHOL/HDL Ratio: 1.6 RATIO
Triglycerides: 117 mg/dL (ref <150)
VLDL: 23 mg/dL (ref 0–40)

## 2016-09-04 LAB — MRSA PCR SCREENING: MRSA by PCR: NEGATIVE

## 2016-09-04 LAB — TSH: TSH: 1.461 u[IU]/mL (ref 0.350–4.500)

## 2016-09-04 MED ORDER — DEXTROSE IN LACTATED RINGERS 5 % IV SOLN
INTRAVENOUS | Status: DC
  Administered 2016-09-04 – 2016-09-06 (×6): via INTRAVENOUS
  Administered 2016-09-07: 1000 mL via INTRAVENOUS
  Administered 2016-09-08 – 2016-09-13 (×3): via INTRAVENOUS
  Filled 2016-09-04: qty 1000

## 2016-09-04 MED ORDER — ACETAMINOPHEN 325 MG PO TABS
650.0000 mg | ORAL_TABLET | Freq: Four times a day (QID) | ORAL | Status: DC | PRN
Start: 2016-09-04 — End: 2016-09-14

## 2016-09-04 MED ORDER — DIPHENHYDRAMINE HCL 50 MG/ML IJ SOLN: 12.5000 mg | Freq: Four times a day (QID) | INTRAMUSCULAR | Status: DC | PRN

## 2016-09-04 MED ORDER — SODIUM CHLORIDE 0.9 % IV SOLN: INTRAVENOUS | Status: DC

## 2016-09-04 MED ORDER — INSULIN ASPART 100 UNIT/ML ~~LOC~~ SOLN: 0.0000 [IU] | Freq: Three times a day (TID) | SUBCUTANEOUS | Status: DC

## 2016-09-04 MED ORDER — CYCLOSPORINE 0.05 % OP EMUL
1.0000 [drp] | Freq: Two times a day (BID) | OPHTHALMIC | Status: DC
  Administered 2016-09-04 – 2016-09-14 (×20): 1 [drp] via OPHTHALMIC
  Filled 2016-09-04 (×20): qty 1

## 2016-09-04 MED ORDER — MORPHINE SULFATE (PF) 4 MG/ML IV SOLN
2.0000 mg | INTRAVENOUS | Status: DC | PRN
  Administered 2016-09-04 (×2): 2 mg via INTRAVENOUS
  Filled 2016-09-04 (×2): qty 1

## 2016-09-04 MED ORDER — ONDANSETRON HCL 4 MG/2ML IJ SOLN
4.0000 mg | Freq: Once | INTRAMUSCULAR | Status: AC
  Administered 2016-09-04: 4 mg via INTRAVENOUS
  Filled 2016-09-04: qty 2

## 2016-09-04 MED ORDER — ONDANSETRON HCL 4 MG/2ML IJ SOLN
4.0000 mg | Freq: Four times a day (QID) | INTRAMUSCULAR | Status: DC | PRN
  Administered 2016-09-04: 4 mg via INTRAVENOUS
  Filled 2016-09-04: qty 2

## 2016-09-04 MED ORDER — HEPARIN SODIUM (PORCINE) 5000 UNIT/ML IJ SOLN
5000.0000 [IU] | Freq: Three times a day (TID) | INTRAMUSCULAR | Status: DC
  Administered 2016-09-04 – 2016-09-14 (×29): 5000 [IU] via SUBCUTANEOUS
  Filled 2016-09-04 (×29): qty 1

## 2016-09-04 MED ORDER — HYDROMORPHONE HCL 1 MG/ML IJ SOLN
0.5000 mg | INTRAMUSCULAR | Status: DC | PRN
  Filled 2016-09-04: qty 1

## 2016-09-04 MED ORDER — LORAZEPAM 2 MG/ML IJ SOLN
2.0000 mg | Freq: Once | INTRAMUSCULAR | Status: AC
  Administered 2016-09-04: 2 mg via INTRAVENOUS

## 2016-09-04 MED ORDER — NALOXONE HCL 0.4 MG/ML IJ SOLN: 0.4000 mg | INTRAMUSCULAR | Status: DC | PRN

## 2016-09-04 MED ORDER — HYDROMORPHONE HCL 1 MG/ML IJ SOLN
1.0000 mg | INTRAMUSCULAR | Status: DC | PRN
  Administered 2016-09-04: 1 mg via INTRAMUSCULAR

## 2016-09-04 MED ORDER — INSULIN ASPART 100 UNIT/ML ~~LOC~~ SOLN
0.0000 [IU] | SUBCUTANEOUS | Status: DC
  Administered 2016-09-04: 3 [IU] via SUBCUTANEOUS
  Administered 2016-09-04 – 2016-09-05 (×4): 2 [IU] via SUBCUTANEOUS
  Administered 2016-09-06 (×3): 1 [IU] via SUBCUTANEOUS
  Administered 2016-09-06: 2 [IU] via SUBCUTANEOUS
  Administered 2016-09-06 – 2016-09-07 (×2): 1 [IU] via SUBCUTANEOUS
  Administered 2016-09-07: 2 [IU] via SUBCUTANEOUS
  Administered 2016-09-08: 1 [IU] via SUBCUTANEOUS
  Administered 2016-09-08: 2 [IU] via SUBCUTANEOUS
  Administered 2016-09-08: 1 [IU] via SUBCUTANEOUS
  Administered 2016-09-09: 2 [IU] via SUBCUTANEOUS
  Administered 2016-09-09 – 2016-09-10 (×8): 1 [IU] via SUBCUTANEOUS
  Administered 2016-09-10 – 2016-09-11 (×3): 2 [IU] via SUBCUTANEOUS
  Administered 2016-09-11 (×4): 1 [IU] via SUBCUTANEOUS
  Administered 2016-09-11 – 2016-09-12 (×3): 2 [IU] via SUBCUTANEOUS
  Administered 2016-09-12 (×3): 1 [IU] via SUBCUTANEOUS
  Administered 2016-09-12 – 2016-09-13 (×2): 2 [IU] via SUBCUTANEOUS
  Administered 2016-09-13: 1 [IU] via SUBCUTANEOUS
  Administered 2016-09-13 – 2016-09-14 (×7): 2 [IU] via SUBCUTANEOUS

## 2016-09-04 MED ORDER — DIPHENHYDRAMINE HCL 12.5 MG/5ML PO ELIX: 12.5000 mg | ORAL_SOLUTION | Freq: Four times a day (QID) | ORAL | Status: DC | PRN

## 2016-09-04 MED ORDER — PIPERACILLIN-TAZOBACTAM 3.375 G IVPB 30 MIN: 3.3750 g | Freq: Once | INTRAVENOUS | Status: DC

## 2016-09-04 MED ORDER — SODIUM CHLORIDE 0.9% FLUSH
3.0000 mL | Freq: Two times a day (BID) | INTRAVENOUS | Status: DC
  Administered 2016-09-05 – 2016-09-13 (×7): 3 mL via INTRAVENOUS

## 2016-09-04 MED ORDER — LEVOTHYROXINE SODIUM 100 MCG IV SOLR
100.0000 ug | Freq: Every day | INTRAVENOUS | Status: DC
  Administered 2016-09-05 – 2016-09-12 (×8): 100 ug via INTRAVENOUS
  Filled 2016-09-04 (×8): qty 5

## 2016-09-04 MED ORDER — HYDROMORPHONE 1 MG/ML IV SOLN
INTRAVENOUS | Status: DC
  Administered 2016-09-04: 25 mg via INTRAVENOUS
  Administered 2016-09-04: 1.7 mg via INTRAVENOUS
  Administered 2016-09-05: 2.1 mg via INTRAVENOUS
  Administered 2016-09-05: 1.2 mg via INTRAVENOUS
  Filled 2016-09-04 (×2): qty 25

## 2016-09-04 MED ORDER — HYDROMORPHONE HCL 1 MG/ML IJ SOLN: 0.5000 mg | Freq: Once | INTRAMUSCULAR | Status: DC

## 2016-09-04 MED ORDER — SODIUM CHLORIDE 0.9% FLUSH: 9.0000 mL | INTRAVENOUS | Status: DC | PRN

## 2016-09-04 MED ORDER — SODIUM CHLORIDE 0.9 % IV BOLUS (SEPSIS)
1000.0000 mL | Freq: Once | INTRAVENOUS | Status: AC
  Administered 2016-09-04: 1000 mL via INTRAVENOUS

## 2016-09-04 MED ORDER — LORAZEPAM 2 MG/ML IJ SOLN: 2.0000 mg | Freq: Once | INTRAMUSCULAR | Status: DC

## 2016-09-04 MED ORDER — SODIUM CHLORIDE 0.9 % IV BOLUS (SEPSIS): 1000.0000 mL | Freq: Once | INTRAVENOUS | Status: DC

## 2016-09-04 MED ORDER — ACETAMINOPHEN 650 MG RE SUPP: 650.0000 mg | Freq: Four times a day (QID) | RECTAL | Status: DC | PRN

## 2016-09-04 MED ORDER — LORAZEPAM 2 MG/ML IJ SOLN
INTRAMUSCULAR | Status: AC
  Administered 2016-09-04: 13:00:00
  Filled 2016-09-04: qty 1

## 2016-09-04 MED ORDER — DEXTROSE 5 % IV SOLN: 2.0000 g | INTRAVENOUS | Status: DC

## 2016-09-04 MED ORDER — SODIUM CHLORIDE 0.9 % IV SOLN
Freq: Once | INTRAVENOUS | Status: AC
  Administered 2016-09-04: 09:00:00 via INTRAVENOUS

## 2016-09-04 MED ORDER — ONDANSETRON HCL 4 MG/2ML IJ SOLN: 4.0000 mg | Freq: Four times a day (QID) | INTRAMUSCULAR | Status: DC | PRN

## 2016-09-04 MED ORDER — ONDANSETRON HCL 4 MG PO TABS
4.0000 mg | ORAL_TABLET | Freq: Four times a day (QID) | ORAL | Status: DC | PRN
  Administered 2016-09-06 – 2016-09-10 (×2): 4 mg via ORAL
  Filled 2016-09-04 (×2): qty 1

## 2016-09-04 NOTE — Consult Note (Signed)
[facility] Surgery Consult Note  ALLEIGH Hinton 1940/05/22  [id number].    Requesting MD: Oleta Mouse, MD Chief Complaint/Reason for Consult: choledocholithiasis   HPI:  Bonnie Hinton is a 76 year-old female with multiple medical problems including, but not limited to, PMH HTN, HLD, hypothyroidism, cholelithiasis, and diabetes mellitus. She presented to the ED today with 3 days of abdominal pain that became acutely worse this morning. Pain is sharp, non-radiating, and located over epigastrium and RUQ without radiation.. Associated symptoms include diarrhea and one episode of vomiting. Describes no aggravating or alleviating factors.  ED workup: WBC 16.8,  AST 859,  ALT 867,  Alk Phos 213,  t. Bilirubin 4.7,  Lipase 723,  Creatinine 2.43, Sodium 133,  Potassium 3.2,  Blood glucose 184 RUQ [place] - Cholelithiasis. Largest calculus 13 mm. Negative for gallbladder wall thickening. Negative for sonographic Murphy sign. CBD 46m.   ROS: Review of Systems  Constitutional: Negative for fever.  Gastrointestinal: Positive for abdominal pain, diarrhea, nausea and vomiting.  All other systems reviewed and are negative.   Family History  Problem Relation Age of Onset  . Cancer Father   . Cancer Brother   . Diabetes Maternal Aunt     Past Medical History:  Diagnosis Date  . Anxiety   . Arthritis   . Chronic back pain    stenosis and arthritis  . Complication of anesthesia    anxious when she wakes up  . GERD (gastroesophageal reflux disease)    takes Pantoprazole daily  . Glaucoma    dry angle  . History of bronchitis    30+ yrs ago  . History of MRSA infection 2004   several yrs ago  . Hyperlipidemia    takes Zetia and Crestor daily  . Hypertension    take Amlodipine daily  . Hypothyroidism    takes Synthroid daily  . Joint pain   . Joint swelling   . Neuropathy    takes Gabapentin daily  . Peripheral edema    takes Furosemide daily as needed  . Pneumonia    hx of-30 + yrs  ago  . Pre-diabetes    takes Victoza daily  . Primary localized osteoarthritis of right knee   . Subdural hematoma (HCC) 8 yrs ago   hx of  . Urinary frequency   . Urinary urgency     Past Surgical History:  Procedure Laterality Date  . ABDOMINAL HYSTERECTOMY    . COLONOSCOPY    . RADIOLOGY WITH ANESTHESIA N/A 08/22/2015   Procedure: MRI OF LUMBAR SPINE   (RADIOLOGY WITH ANESTHESIA);  Surgeon: Medication Radiologist, MD;  Location[facility]  Service: Radiology;  Laterality: N/A;  . TOTAL HIP ARTHROPLASTY Right   . TOTAL HIP ARTHROPLASTY Left   . TOTAL KNEE ARTHROPLASTY Left   . TOTAL KNEE ARTHROPLASTY Right 01/01/2016   Procedure: TOTAL KNEE ARTHROPLASTY;  Surgeon: RElsie Saas MD;  Location[facility]  Service: Orthopedics;  Laterality: Right;    Social History:  reports that she has never smoked. She has never used smokeless tobacco. She reports that she does not drink alcohol or use drugs.  Allergies:  Allergies  Allergen Reactions  . Erythromycin Rash  . Iodinated Diagnostic Agents Swelling and Rash  . Fentanyl Other (See Comments)    Duragesic-25  . Latex Itching and Rash  . Lisinopril Other (See Comments)    Hyperkalemia      (Not in a hospital admission)  Blood pressure 99/63, pulse (!) 105, temperature 97.8 F (  36.6 C), resp. rate (!) 24, weight 136.1 kg (300 lb), SpO2 92 %. Physical Exam: Physical Exam  Constitutional: She is oriented to person, place, and time. She appears well-developed and well-nourished. No distress.  HENT:  Head: Normocephalic and atraumatic.  Right Ear: External ear normal.  Left Ear: External ear normal.  Eyes: EOM are normal. Pupils are equal, round, and reactive to light. Right eye exhibits no discharge. Left eye exhibits no discharge.  Neck: No tracheal deviation present.  Cardiovascular: Regular rhythm and intact distal pulses.  Exam reveals no gallop and no friction rub.   No murmur heard. tachycardic  Pulmonary/Chest: Effort  normal and breath sounds normal. No stridor. No respiratory distress.  Abdominal: Soft. She exhibits distension. She exhibits no mass. There is tenderness (epigastric and RUQ without guarding or peritonitis ). There is no rebound and no guarding.  Musculoskeletal: Normal range of motion. She exhibits no tenderness or deformity.  Lymphadenopathy:    She has no cervical adenopathy.  Neurological: She is alert and oriented to person, place, and time. No sensory deficit.  Skin: Skin is warm and dry. No rash noted. She is not diaphoretic.  Psychiatric: She has a normal mood and affect. Her behavior is normal.    Results for orders placed or performed during the hospital encounter of 09/04/16 (from the past 48 hour(s))  CBC with Differential     Status: Abnormal   Collection Time: 09/04/16  8:00 AM  Result Value Ref Range   WBC 16.8 (H) 4.0 - 10.5 K/uL   RBC 4.83 3.87 - 5.11 MIL/uL   Hemoglobin 14.4 12.0 - 15.0 g/dL   HCT 43.4 36.0 - 46.0 %   MCV 89.9 78.0 - 100.0 fL   MCH 29.8 26.0 - 34.0 pg   MCHC 33.2 30.0 - 36.0 g/dL   RDW 14.7 11.5 - 15.5 %   Platelets 287 150 - 400 K/uL   Neutrophils Relative % 93 %   Neutro Abs 15.7 (H) 1.7 - 7.7 K/uL   Lymphocytes Relative 2 %   Lymphs Abs 0.3 (L) 0.7 - 4.0 K/uL   Monocytes Relative 5 %   Monocytes Absolute 0.8 0.1 - 1.0 K/uL   Eosinophils Relative 0 %   Eosinophils Absolute 0.0 0.0 - 0.7 K/uL   Basophils Relative 0 %   Basophils Absolute 0.0 0.0 - 0.1 K/uL  Comprehensive metabolic panel     Status: Abnormal   Collection Time: 09/04/16  8:00 AM  Result Value Ref Range   Sodium 133 (L) 135 - 145 mmol/L   Potassium 3.2 (L) 3.5 - 5.1 mmol/L   Chloride 98 (L) 101 - 111 mmol/L   CO2 15 (L) 22 - 32 mmol/L   Glucose, Bld 184 (H) 65 - 99 mg/dL   BUN 26 (H) 6 - 20 mg/dL   Creatinine, Ser 2.43 (H) 0.44 - 1.00 mg/dL   Calcium 8.1 (L) 8.9 - 10.3 mg/dL   Total Protein 6.8 6.5 - 8.1 g/dL   Albumin 3.3 (L) 3.5 - 5.0 g/dL   AST 859 (H) 15 - 41 U/L    ALT 867 (H) 14 - 54 U/L   Alkaline Phosphatase 213 (H) 38 - 126 U/L   Total Bilirubin 4.7 (H) 0.3 - 1.2 mg/dL   GFR calc non Af Amer 18 (L) >60 mL/min   GFR calc Af Amer 21 (L) >60 mL/min    Comment: (NOTE) The eGFR has been calculated using the CKD EPI equation. This calculation has not  been validated in all clinical situations. eGFR's persistently <60 mL/min signify possible Chronic Kidney Disease.    Anion gap 20 (H) 5 - 15  Lipase, blood     Status: Abnormal   Collection Time: 09/04/16  8:00 AM  Result Value Ref Range   Lipase 723 (H) 11 - 51 U/L    Comment: RESULTS CONFIRMED BY MANUAL DILUTION   No results found.  Assessment/Plan Acute gallstone pancreatitis - lipase 723; bowel rest, IVF Possible cholecystitis -WBC 16; recommend starting IV Rocephin Possible choledocholithiasis - total bilirubin 4.7. GI consult. Repeat LFT's in AM.  Plan: admit to medicine for further workup and management of MMP. Recommend laparoscopic cholecystectomy this admission once pancreatitis improves.   Will discuss role of pre-op cardiac clearance with MD - obese and has DM however echo 2017 showed normal cardiac function and pt tolerated surgery in October 2017.   Jill Alexanders, [facility] Surgery 09/04/2016, 10:31 AM Pager: [phone] Consults: [phone] Mon-Fri 7:00 am-4:30 pm Sat-Sun 7:00 am-11:30 am

## 2016-09-04 NOTE — Telephone Encounter (Signed)
FYI Daughter reported that patient is in the Emergency room with gallbladder issues  If any questions please call daughter [phone]

## 2016-09-04 NOTE — Telephone Encounter (Signed)
FYI

## 2016-09-04 NOTE — ED Triage Notes (Signed)
Patient from home with [facility] for RUQ abdominal pain x3 days.  Nontender and no change in pain on palpation.  Patient states "I was warned that I might feel this way by Dr Tobi BastosAnna because of my [gall]stones".  Patient alert and oriented at this time.  Received 4 mg morphine and 100mL NS from EMS prior to arrival.

## 2016-09-04 NOTE — H&P (Signed)
History and Physical    Bonnie Gashlizabeth A Buller ZOX:[id number] DOB: [birth year] DOA: 09/04/2016  PCP: Sherlene Shamsullo, Teresa L, MD Patient coming from: home  Chief Complaint: abdominal pain  HPI: Bonnie Hinton is a 76 y.o. female with medical history significant for hypertension, hyperlipidemia diabetes, hypothyroidism, cholelithiasis, GERD, chronic back pain presents to emergency department chief complaint persistent worsening abdominal pain. Initial evaluation reveals acute kidney injury, leukocytosis, hypokalemia, acute gallstone pancreatitis  Information is obtained from the patient and her daughter who is at the bedside. Patient was diagnosed with cholelithiasis in January of this year after an episode. She has not had any issues since that time. 3 days ago she developed sudden sharp abdominal pain. She reports pain is located epigastric and RUQ area she describes it as sharp nonradiating. Associated symptoms include nausea without vomiting as well as decreased oral intake. She reports that nothing makes it better or worse. She denies fever chills headache dizziness syncope or near-syncope. She denies dysuria hematuria frequency or urgency. She denies cough chest pain palpitations shortness of breath lower extremity edema or orthopnea.   ED Course: In emergency department she's afebrile with a blood pressure on the low end of normal. Tachycardia she is not hypoxic. She is provided with vigorous IV fluids Dilaudid Zofran and Zosyn.  Review of Systems: As per HPI otherwise all other systems reviewed and are negative.   Ambulatory Status: She typically ambulates with a cane. Is fairly independent with ADLs and denies any recent falls  Past Medical History:  Diagnosis Date  . Anxiety   . Arthritis   . Chronic back pain    stenosis and arthritis  . Complication of anesthesia    anxious when she wakes up  . GERD (gastroesophageal reflux disease)    takes Pantoprazole daily  . Glaucoma    dry  angle  . History of bronchitis    30+ yrs ago  . History of MRSA infection 2004   several yrs ago  . Hyperlipidemia    takes Zetia and Crestor daily  . Hypertension    take Amlodipine daily  . Hypothyroidism    takes Synthroid daily  . Joint pain   . Joint swelling   . Neuropathy    takes Gabapentin daily  . Pancreatitis, gallstone   . Peripheral edema    takes Furosemide daily as needed  . Pneumonia    hx of-30 + yrs ago  . Pre-diabetes    takes Victoza daily  . Primary localized osteoarthritis of right knee   . Subdural hematoma (HCC) 8 yrs ago   hx of  . Urinary frequency   . Urinary urgency     Past Surgical History:  Procedure Laterality Date  . ABDOMINAL HYSTERECTOMY    . COLONOSCOPY    . RADIOLOGY WITH ANESTHESIA N/A 08/22/2015   Procedure: MRI OF LUMBAR SPINE   (RADIOLOGY WITH ANESTHESIA);  Surgeon: Medication Radiologist, MD;  Location: [facility] OR;  Service: Radiology;  Laterality: N/A;  . TOTAL HIP ARTHROPLASTY Right   . TOTAL HIP ARTHROPLASTY Left   . TOTAL KNEE ARTHROPLASTY Left   . TOTAL KNEE ARTHROPLASTY Right 01/01/2016   Procedure: TOTAL KNEE ARTHROPLASTY;  Surgeon: Salvatore Marvelobert Wainer, MD;  [facility];  Service: Orthopedics;  Laterality: Right;    Social History   Social History  . Marital status: Widowed    Spouse name: N/A  . Number of children: N/A  . Years of education: N/A   Occupational History  . Not on file.  Social History Main Topics  . Smoking status: Never Smoker  . Smokeless tobacco: Never Used  . Alcohol use No  . Drug use: No  . Sexual activity: Not on file   Other Topics Concern  . Not on file   Social History Narrative  . No narrative on file  He lives at home alone. She does have family nearby  Allergies  Allergen Reactions  . Erythromycin Rash  . Iodinated Diagnostic Agents Swelling and Rash  . Fentanyl Other (See Comments)    Duragesic-25  . Latex Itching and Rash  . Lisinopril Other (See Comments)     Hyperkalemia     Family History  Problem Relation Age of Onset  . Cancer Father   . Cancer Brother   . Diabetes Maternal Aunt     Prior to Admission medications   Medication Sig Start Date End Date Taking? Authorizing Provider  acetaminophen (TYLENOL) 325 MG tablet Take 2 tablets (650 mg total) by mouth every 6 (six) hours as needed for mild pain (or Fever >/= 101). 01/04/16   Shepperson, Kirstin, PA-C  amLODipine (NORVASC) 5 MG tablet Take 1 tablet (5 mg total) by mouth daily. 12/04/15   Sherlene Shams, MD  aspirin EC 81 MG tablet Take 81 mg by mouth daily.    [provider]  celecoxib (CELEBREX) 200 MG capsule Take 1 capsule (200 mg total) by mouth daily. 06/27/16   Sherlene Shams, MD  cycloSPORINE (RESTASIS) 0.05 % ophthalmic emulsion Place 1 drop into both eyes 2 (two) times daily.    [provider]  DULoxetine (CYMBALTA) 30 MG capsule Take 30 mg by mouth 2 (two) times daily. 11/27/15   [provider]  ezetimibe (ZETIA) 10 MG tablet TAKE 1 TABLET BY MOUTH EVERY DAY 05/27/16   Sherlene Shams, MD  gabapentin (NEURONTIN) 300 MG capsule Take 1 capsule (300 mg total) by mouth 2 (two) times daily. 01/04/16   Shepperson, Kirstin, PA-C  glucose blood (ONETOUCH VERIO) test strip USE TO TEST BLOOD SUGAR ONCE DAILY 06/25/16   Sherlene Shams, MD  levothyroxine (SYNTHROID, LEVOTHROID) 200 MCG tablet Take 1 tablet (200 mcg total) by mouth daily before breakfast. With 25 mcg daily  (total daily dose 225 mcg) 06/27/16   Sherlene Shams, MD  LORazepam (ATIVAN) 0.5 MG tablet TAKE 1 TABLET BY MOUTH AT BEDTIME 08/23/16   Sherlene Shams, MD  LUMIGAN 0.01 % SOLN Place 1-2 drops into both eyes at bedtime. 01/30/16   [provider]  oxyCODONE-acetaminophen (PERCOCET) 10-325 MG tablet Take 1 tablet by mouth daily as needed. May refill on or after September 01 2016 06/26/16   Sherlene Shams, MD  pantoprazole (PROTONIX) 40 MG tablet Take 1 tablet (40 mg total) by mouth daily. 12/04/15    Sherlene Shams, MD  rosuvastatin (CRESTOR) 20 MG tablet TAKE 1 TABLET BY MOUTH DAILY 03/14/16   Sherlene Shams, MD  sertraline (ZOLOFT) 50 MG tablet Take 1.5 tablets (75 mg total) by mouth daily. 03/27/16   Sherlene Shams, MD  tolterodine (DETROL LA) 4 MG 24 hr capsule TAKE 1 CAPSULE BY MOUTH TWICE DAILY 03/14/16   Sherlene Shams, MD  Vitamin D, Ergocalciferol, (DRISDOL) 50000 units CAPS capsule TAKE 1 CAPSULE (50,000 UNITS TOTAL) BY MOUTH ONCE A WEEK. 08/12/16   Sherlene Shams, MD    Physical Exam: Vitals:   09/04/16 0900 09/04/16 0930 09/04/16 0945 09/04/16 1130  BP: 100/64 (!) 101/52 99/63 99/67  Pulse: (!) 106 (!) 112 (!) 105 (!) 110  Resp: (!) 23 (!) 26 (!) 24 19  Temp:      SpO2: 94% 95% 92% 93%  Weight:         General:  Appears Slightly anxious and slightly uncomfortable but in no acute distress Eyes:  PERRL, EOMI, normal lids, iris ENT:  grossly normal hearing, lips & tongue, mucous membranes of her mouth pink but dry. Poor dentition Neck:  no LAD, masses or thyromegaly Cardiovascular:  Tachycardic but regular, no m/r/g. No LE edema.  Respiratory:  CTA bilaterally, no w/r/r. Normal respiratory effort. Abdomen:  soft, ntnd, obese slightly distended very sluggish bowel sounds moderate tenderness in epigastric area no guarding or rebounding Skin:  no rash or induration seen on limited exam Musculoskeletal:  grossly normal tone BUE/BLE, good ROM, no bony abnormality Psychiatric:  grossly normal mood and affect, speech fluent and appropriate, AOx3 Neurologic:  CN 2-12 grossly intact, moves all extremities in coordinated fashion, sensation intact  Labs on Admission: I have personally reviewed following labs and imaging studies  CBC:  Recent Labs Lab 09/04/16 0800  WBC 16.8*  NEUTROABS 15.7*  HGB 14.4  HCT 43.4  MCV 89.9  PLT 287   Basic Metabolic Panel:  Recent Labs Lab 09/04/16 0800  NA 133*  K 3.2*  CL 98*  CO2 15*  GLUCOSE 184*  BUN 26*  CREATININE  2.43*  CALCIUM 8.1*   GFR: Estimated Creatinine Clearance: 28.9 mL/min (A) (by C-G formula based on SCr of 2.43 mg/dL (H)). Liver Function Tests:  Recent Labs Lab 09/04/16 0800  AST 859*  ALT 867*  ALKPHOS 213*  BILITOT 4.7*  PROT 6.8  ALBUMIN 3.3*    Recent Labs Lab 09/04/16 0800  LIPASE 723*   No results for input(s): AMMONIA in the last 168 hours. Coagulation Profile: No results for input(s): INR, PROTIME in the last 168 hours. Cardiac Enzymes: No results for input(s): CKTOTAL, CKMB, CKMBINDEX, TROPONINI in the last 168 hours. BNP (last 3 results) No results for input(s): PROBNP in the last 8760 hours. HbA1C: No results for input(s): HGBA1C in the last 72 hours. CBG: No results for input(s): GLUCAP in the last 168 hours. Lipid Profile: No results for input(s): CHOL, HDL, LDLCALC, TRIG, CHOLHDL, LDLDIRECT in the last 72 hours. Thyroid Function Tests: No results for input(s): TSH, T4TOTAL, FREET4, T3FREE, THYROIDAB in the last 72 hours. Anemia Panel: No results for input(s): VITAMINB12, FOLATE, FERRITIN, TIBC, IRON, RETICCTPCT in the last 72 hours. Urine analysis:    Component Value Date/Time   COLORURINE YELLOW 04/05/2016 2140   APPEARANCEUR HAZY (A) 04/05/2016 2140   LABSPEC 1.014 04/05/2016 2140   PHURINE 5.0 04/05/2016 2140   GLUCOSEU NEGATIVE 04/05/2016 2140   GLUCOSEU NEGATIVE 04/25/2015 1359   HGBUR SMALL (A) 04/05/2016 2140   BILIRUBINUR NEGATIVE 04/05/2016 2140   BILIRUBINUR neg 04/25/2015 1450   KETONESUR 20 (A) 04/05/2016 2140   PROTEINUR 30 (A) 04/05/2016 2140   UROBILINOGEN 0.2 04/25/2015 1450   UROBILINOGEN 0.2 04/25/2015 1359   NITRITE POSITIVE (A) 04/05/2016 2140   LEUKOCYTESUR TRACE (A) 04/05/2016 2140    Creatinine Clearance: Estimated Creatinine Clearance: 28.9 mL/min (A) (by C-G formula based on SCr of 2.43 mg/dL (H)).  Sepsis Labs: @LABRCNTIP (procalcitonin:4,lacticidven:4) )No results found for this or any previous visit (from the  past 240 hour(s)).   Radiological Exams on Admission: US Abdomen Limited Ruq  Result Date: 09/04/2016 CLINICAL DATA:  Right upper quadrant pain EXAM: ULTRASOUND ABDOMEN LIMITED  RIGHT UPPER QUADRANT COMPARISON:  Ultrasound abdomen 04/06/2016 FINDINGS: Gallbladder: Cholelithiasis. Largest calculus 13 mm. Negative for gallbladder wall thickening. Negative for sonographic Murphy sign Common bile duct: Diameter: 7 mm Liver: Increased echogenicity of the liver without focal liver lesion. IMPRESSION: Cholelithiasis without evidence of cholecystitis Common bile duct upper normal Echogenic liver compatible with fatty infiltration. Electronically Signed   By: Marlan Palau M.D.   On: 09/04/2016 10:42    EKG: Independently reviewed. Sinus tachycardia Low voltage, precordial leads Minimal ST depression, inferior leads   Assessment/Plan Principal Problem:   Pancreatitis, gallstone Active Problems:   Diabetes mellitus type 2, controlled (HCC)   Morbid obesity (HCC)   Hyperlipidemia   Acquired hypothyroidism   Chronic low back pain with right-sided sciatica   Leukocytosis   Anxiety, generalized   Fatty liver   Cholelithiasis   Acute kidney injury (HCC)   Diarrhea   1. Acute gallstone pancreatitis. Patient with a history of gallstones. Lipase 723. Right upper quadrant ultrasound reveals cholelithiasis without evidence of cholecystitis.Common bile duct upper end of normal limits. 10 obesity 16, elevated LFTs, total bilirubin 4.7. Evaluated by general surgery and gastroenterology in the urgency department. General surgery recommending laparoscopic cholecystectomy this admission once pancreatitis improves. Concern for possible cholecystitis and choledocholothiasis -Admit to telemetry -Vigorous IV fluids -Rocephin per general surgery -NPO -PT INR -lipid panel -Portable chest x-ray -supportive therapy -Complete metabolic panel in the a.m. -CBC in the a.m.  #2. Acute kidney injury. Creatinine 2.3 on  admission. No history of chronic kidney disease. -IV fluids as noted above -Hold nephrotoxins -Monitor urine output -Recheck in the morning  #3. Leukocytosis. Likely related to above. Currently she's afebrile hemodynamically stable and nontoxic appearing. She received 1 dose of Zosyn in the emergency department. -Follow urinalysis -Rocephin per surgery recommendation -Monitor  #4. Diabetes. Serum glucose 184 on admission. Home medications include oral agents -Obtain a hemoglobin A1c -CBGs every 4 hours while nothing by mouth -Sliding scale insulin for optimal control  #5. Hypertension. Blood pressure is on the low end of normal while in the emergency department. Home medications include Norvasc -Hold Norvasc for now -Monitor  #6. Hypothyroidism. Since on 225 mcg daily po -will obtain TSH -synthroid 100 mcg IV -Resume home medications when indicated  #7. Diarrhea. Likely related to above. Patient denies any recent antibiotic use. -GI pathogen panel -See above  #8. Hx fatty liver. Elevated LFT's as noted above. -see #1 -cmet in am     DVT prophylaxis: heparin  Code Status: full  Family Communication: daughter at bedside  Disposition Plan: hoje  Consults called: Newman Pies, general surgery  Admission status: inpatient    Gwenyth Bender MD [facility]  If 7PM-7AM, please contact night-coverage [email or web address] Password [facility]  09/04/2016, 11:56 AM

## 2016-09-04 NOTE — ED Provider Notes (Signed)
[facility]-EMERGENCY DEPT Provider Note   CSN: [id number] Arrival date & time: 09/04/16  4098     History   Chief Complaint Chief Complaint  Patient presents with  . Abdominal Pain    HPI Bonnie Hinton is a 76 y.o. female.  The history is provided by the patient and a relative (daughter).  Abdominal Pain   This is a new problem. The current episode started more than 2 days ago. The problem occurs daily. The problem has been gradually worsening. The pain is associated with an unknown factor. The pain is located in the RUQ and epigastric region. The pain is severe. Associated symptoms include diarrhea. Pertinent negatives include fever, hematochezia, melena, nausea, dysuria and frequency. Nothing aggravates the symptoms. The symptoms are relieved by acetaminophen. Past workup includes GI consult and ultrasound. Her past medical history is significant for gallstones.   76 year old female with history of cholelithiasis who presents with upper abdominal pain. Pain has been intermittent for 3 days. Was most severe this morning at 6 AM. Associated with diarrhea but no nausea or vomiting. Denies any fevers or chills or any urinary symptoms. EMS was called and she did receive 100 mL of normal saline and 4 mg of morphine. Still, pains of severe abdominal pain. Also history of HTN, HLD, DM.  Past Medical History:  Diagnosis Date  . Anxiety   . Arthritis   . Chronic back pain    stenosis and arthritis  . Complication of anesthesia    anxious when she wakes up  . GERD (gastroesophageal reflux disease)    takes Pantoprazole daily  . Glaucoma    dry angle  . History of bronchitis    30+ yrs ago  . History of MRSA infection 2004   several yrs ago  . Hyperlipidemia    takes Zetia and Crestor daily  . Hypertension    take Amlodipine daily  . Hypothyroidism    takes Synthroid daily  . Joint pain   . Joint swelling   . Neuropathy    takes Gabapentin daily  . Pancreatitis, gallstone   .  Peripheral edema    takes Furosemide daily as needed  . Pneumonia    hx of-30 + yrs ago  . Pre-diabetes    takes Victoza daily  . Primary localized osteoarthritis of right knee   . Subdural hematoma (HCC) 8 yrs ago   hx of  . Urinary frequency   . Urinary urgency     Patient Active Problem List   Diagnosis Date Noted  . Pancreatitis, gallstone   . Cholelithiasis 06/29/2016  . Dizziness 05/24/2016  . Fatty liver 04/09/2016  . Anxiety, generalized 02/20/2016  . Leukocytosis 01/11/2016  . Arthritis   . Acquired hypothyroidism   . History of CVA (cerebrovascular accident)   . Status post bilateral hip replacements   . Status post left knee replacement   . Chronic low back pain with right-sided sciatica   . Essential hypertension, benign 07/25/2015  . Preoperative clearance 07/25/2015  . Hyperlipidemia 07/25/2015  . Urinary urgency 04/26/2015  . Diabetes mellitus type 2, controlled (HCC) 01/24/2015  . Morbid obesity (HCC) 01/24/2015  . Vitamin D deficiency 01/24/2015  . 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 ABDOMINAL HYSTERECTOMY    . COLONOSCOPY    . RADIOLOGY WITH ANESTHESIA N/A 08/22/2015   Procedure: MRI OF LUMBAR SPINE   (RADIOLOGY WITH ANESTHESIA);  Surgeon: Medication  Radiologist, MD;  Location: [facility] OR;  Service: Radiology;  Laterality: N/A;  . TOTAL HIP ARTHROPLASTY Right   . TOTAL HIP ARTHROPLASTY Left   . TOTAL KNEE ARTHROPLASTY Left   . TOTAL KNEE ARTHROPLASTY Right 01/01/2016   Procedure: TOTAL KNEE ARTHROPLASTY;  Surgeon: Salvatore Marvel, MD;  Location: [facility] OR;  Service: Orthopedics;  Laterality: Right;    OB History    No data available       Home Medications    Prior to Admission medications   Medication Sig Start Date End Date Taking? Authorizing Provider  acetaminophen (TYLENOL) 325 MG tablet Take 2 tablets (650 mg total) by mouth every 6 (six) hours as needed for  mild pain (or Fever >/= 101). 01/04/16   Shepperson, Kirstin, PA-C  amLODipine (NORVASC) 5 MG tablet Take 1 tablet (5 mg total) by mouth daily. 12/04/15   Sherlene Shams, MD  aspirin EC 81 MG tablet Take 81 mg by mouth daily.    [provider]  celecoxib (CELEBREX) 200 MG capsule Take 1 capsule (200 mg total) by mouth daily. 06/27/16   Sherlene Shams, MD  cycloSPORINE (RESTASIS) 0.05 % ophthalmic emulsion Place 1 drop into both eyes 2 (two) times daily.    [provider]  DULoxetine (CYMBALTA) 30 MG capsule Take 30 mg by mouth 2 (two) times daily. 11/27/15   [provider]  ezetimibe (ZETIA) 10 MG tablet TAKE 1 TABLET BY MOUTH EVERY DAY 05/27/16   Sherlene Shams, MD  gabapentin (NEURONTIN) 300 MG capsule Take 1 capsule (300 mg total) by mouth 2 (two) times daily. 01/04/16   Shepperson, Kirstin, PA-C  glucose blood (ONETOUCH VERIO) test strip USE TO TEST BLOOD SUGAR ONCE DAILY 06/25/16   Sherlene Shams, MD  levothyroxine (SYNTHROID, LEVOTHROID) 200 MCG tablet Take 1 tablet (200 mcg total) by mouth daily before breakfast. With 25 mcg daily  (total daily dose 225 mcg) 06/27/16   Sherlene Shams, MD  liraglutide 18 MG/3ML SOPN Inject 0.1 mLs (0.6 mg total) into the skin daily. Patient not taking: Reported on 07/23/2016 06/28/16 09/26/16  Sherlene Shams, MD  LORazepam (ATIVAN) 0.5 MG tablet TAKE 1 TABLET BY MOUTH AT BEDTIME 08/23/16   Sherlene Shams, MD  LUMIGAN 0.01 % SOLN Place 1-2 drops into both eyes at bedtime. 01/30/16   [provider]  oxyCODONE-acetaminophen (PERCOCET) 10-325 MG tablet Take 1 tablet by mouth daily as needed. May refill on or after September 01 2016 06/26/16   Sherlene Shams, MD  pantoprazole (PROTONIX) 40 MG tablet Take 1 tablet (40 mg total) by mouth daily. 12/04/15   Sherlene Shams, MD  polyethylene glycol (MIRALAX / GLYCOLAX) packet 17grams in 6 oz of water twice a day until bowel movement.  LAXITIVE.  Restart if two days since last bowel movement Patient  not taking: Reported on 07/23/2016 01/04/16   Shepperson, Kirstin, PA-C  rosuvastatin (CRESTOR) 20 MG tablet TAKE 1 TABLET BY MOUTH DAILY 03/14/16   Sherlene Shams, MD  sertraline (ZOLOFT) 50 MG tablet Take 1.5 tablets (75 mg total) by mouth daily. 03/27/16   Sherlene Shams, MD  tolterodine (DETROL LA) 4 MG 24 hr capsule TAKE 1 CAPSULE BY MOUTH TWICE DAILY 03/14/16   Sherlene Shams, MD  Vitamin D, Ergocalciferol, (DRISDOL) 50000 units CAPS capsule TAKE 1 CAPSULE (50,000 UNITS TOTAL) BY MOUTH ONCE A WEEK. 08/12/16   Sherlene Shams, MD    Family History Family History  Problem Relation  Age of Onset  . Cancer Father   . Cancer Brother   . Diabetes Maternal Aunt     Social History Social History  Substance Use Topics  . Smoking status: Never Smoker  . Smokeless tobacco: Never Used  . Alcohol use No     Allergies   Erythromycin; Iodinated diagnostic agents; Fentanyl; Latex; and Lisinopril   Review of Systems Review of Systems  Constitutional: Negative for fever.  Respiratory: Negative for shortness of breath.   Cardiovascular: Negative for chest pain.  Gastrointestinal: Positive for abdominal pain and diarrhea. Negative for hematochezia, melena and nausea.  Genitourinary: Negative for dysuria and frequency.  Allergic/Immunologic: Negative for immunocompromised state.  Hematological: Does not bruise/bleed easily.  All other systems reviewed and are negative.    Physical Exam Updated Vital Signs BP 99/63   Pulse (!) 105   Temp 97.8 F (36.6 C)   Resp (!) 24   Wt 136.1 kg (300 lb)   SpO2 92%   BMI 46.99 kg/m   Physical Exam Physical Exam  Nursing note and vitals reviewed. Constitutional: Non-toxic. Appears uncomfortable secondary to abdominal pain.  Head: Normocephalic and atraumatic.  Mouth/Throat: Oropharynx is clear and moist.  Neck: Normal range of motion. Neck supple.  Cardiovascular: Tachycardic rate and regular rhythm.   Pulmonary/Chest: Effort normal and  breath sounds normal.  Abdominal: Soft.Obese.  There is RUQ and epigastric tenderness. There is no rebound and no guarding.  Musculoskeletal: Normal range of motion.  Neurological: Alert, no facial droop, fluent speech, moves all extremities symmetrically Skin: Skin is warm and dry.  Psychiatric: Cooperative   ED Treatments / Results  Labs (all labs ordered are listed, but only abnormal results are displayed) Labs Reviewed  CBC WITH DIFFERENTIAL/PLATELET - Abnormal; Notable for the following:       Result Value   WBC 16.8 (*)    Neutro Abs 15.7 (*)    Lymphs Abs 0.3 (*)    All other components within normal limits  COMPREHENSIVE METABOLIC PANEL - Abnormal; Notable for the following:    Sodium 133 (*)    Potassium 3.2 (*)    Chloride 98 (*)    CO2 15 (*)    Glucose, Bld 184 (*)    BUN 26 (*)    Creatinine, Ser 2.43 (*)    Calcium 8.1 (*)    Albumin 3.3 (*)    AST 859 (*)    ALT 867 (*)    Alkaline Phosphatase 213 (*)    Total Bilirubin 4.7 (*)    GFR calc non Af Amer 18 (*)    GFR calc Af Amer 21 (*)    Anion gap 20 (*)    All other components within normal limits  LIPASE, BLOOD - Abnormal; Notable for the following:    Lipase 723 (*)    All other components within normal limits  URINALYSIS, ROUTINE W REFLEX MICROSCOPIC    EKG  EKG Interpretation  Date/Time:  Wednesday September 04 2016 07:30:06 EDT Ventricular Rate:  107 PR Interval:    QRS Duration: 84 QT Interval:  327 QTC Calculation: 437 R Axis:   6 Text Interpretation:  Sinus tachycardia Low voltage, precordial leads Minimal ST depression, inferior leads Baseline wander in lead(s) V5 no previous EKG  Confirmed by Crista Curb [phone]) on 09/04/2016 8:45:58 AM       Radiology US Abdomen Limited Ruq  Result Date: 09/04/2016 CLINICAL DATA:  Right upper quadrant pain EXAM: ULTRASOUND ABDOMEN LIMITED RIGHT UPPER QUADRANT COMPARISON:  Ultrasound abdomen 04/06/2016 FINDINGS: Gallbladder: Cholelithiasis. Largest  calculus 13 mm. Negative for gallbladder wall thickening. Negative for sonographic Murphy sign Common bile duct: Diameter: 7 mm Liver: Increased echogenicity of the liver without focal liver lesion. IMPRESSION: Cholelithiasis without evidence of cholecystitis Common bile duct upper normal Echogenic liver compatible with fatty infiltration. Electronically Signed   By: Marlan Palauharles  Clark M.D.   On: 09/04/2016 10:42    Procedures Procedures (including critical care time)  Medications Ordered in ED Medications  HYDROmorphone (DILAUDID) injection 1 mg (1 mg Intramuscular Given 09/04/16 0843)  piperacillin-tazobactam (ZOSYN) IVPB 3.375 g (not administered)  sodium chloride 0.9 % bolus 1,000 mL (not administered)  ondansetron (ZOFRAN) injection 4 mg (4 mg Intravenous Given 09/04/16 0852)  0.9 %  sodium chloride infusion ( Intravenous Stopped 09/04/16 0948)  sodium chloride 0.9 % bolus 1,000 mL (1,000 mLs Intravenous New Bag/Given 09/04/16 0930)     Initial Impression / Assessment and Plan / ED Course  I have reviewed the triage vital signs and the nursing notes.  Pertinent labs & imaging results that were available during my care of the patient were reviewed by me and considered in my medical decision making (see chart for details).     Known cholelithiasis and diabetes who presents with intermittent right upper quadrant abdominal pain. Is afebrile, tachycardic but normotensive. Non-peritoneal abdomen, but diffusely tender, worse in the right upper quadrant and epigastrium. Blood work concerning for pancreatitis, likely gallstone related. Elevated LFTs, alkaline phosphatase, and bilirubin, concerning for choledocholithiasis. Right upper quadrant ultrasound visualized and without signs of cholecystitis, and with common bile ducts mid normal. Also with elevated white count of 16.8. Clinically with concern for choledocholithiasis, and even possibly cholangitis. Zosyn given empirically. Spoke with GI, who will  consult. Spoke with hospitalist service, who will admit for ongoing management.  Final Clinical Impressions(s) / ED Diagnoses   Final diagnoses:  RUQ pain  Choledocholithiasis  Gallstone pancreatitis  Cholangitis    New Prescriptions New Prescriptions   No medications on file     Lavera GuiseLiu, Dana Duo, MD 09/04/16 1122

## 2016-09-04 NOTE — Consult Note (Addendum)
[facility] Consult: 11:20 AM 09/04/2016  LOS: 0 days    Referring Provider: Brantley Stage  Primary Care Physician:  Crecencio Mc, MD Primary Gastroenterologist:  Dr Vicente Males in [place].    Reason for Consultation:  Pancreatitis.    IMPRESSION:   *  Acute pancreatitis, presumed biliary. Acute abdominal pain with marketed elevation of lipase, transaminases and lesser elevations of total bilirubin and alkaline phosphatase. Ultrasound confirms known diagnosis of gallstones and has borderline upper normal diameter of common bile duct without obvious bile duct stone or obstruction, fatty liver.  The AST/ALT are higher than is normal for biliary obstruction and entering range of shock liver.   ? Passed bile duct stone.  *  Anxiety.  Required sedative prior to previous MRI due to claustrophobia.    *  Hx GERD.  sxs well controlled with PPI.    *  Morbid obesity.  ? OSA, d not fnd previous sleep studies in chart.    *  NIDDM.   PLAN:     *  Zosyn initiated.  Upping IVF to 200 ml/hour for 8 hours and then reduce to 150/hour.  MRCP ordered with 2 mg IV Ativan just before hand.      Azucena Freed  09/04/2016, 11:20 AM Pager: [phone]     [facility] Attending   I have taken an interval history, reviewed the chart and examined the patient. I agree with the Advanced Practitioner's note, impression and recommendations.    Update - MRCP no CBD stones seen though motion artifact. No cholecystitis.  She is on liraglutide which can cause pancreatitis though suspect it was a passed gallstone and I do think there is a component of shock liver - she was home ill x3 days and has AKI also so it is possible abnl LFT's are all related to that and that liraglutide caused the pancreatitis  My recommendations are (and I  ordered):  1) Change IVF to D5 LR - LR is reportedly better for rehydration and associated better outcomes in acute pancreatitis and D% since NPO (even though diabetic) 2) Diarrhea is highly unlikely to be infectious and I dced GI pathogen panel and precautions - if she has while here you can reconsider testing 3) Don't think she needs Abx as does not have cholecystitis 4) Will f/u tomorrow trend LFT's and lipase 5) May need to stay off liraglutide - I think so   Gatha Mayer, MD, [facility] [phone] (pager) [phone] after 5 PM, weekends and holidays  09/04/2016 3:30 PM       HPI: Bonnie Hinton is a 76 y.o. female.  PMH: Morbid obesity.  Type 2 DM.  Hypothyroidism. Spinal stenosis and chronic pain.  O/A.  S/p bil THR and TKR.  Wheelchair and very limited walking mobility since 12/2015 TKR.  Anxiety.  SDH several years ago.  Peripheral neuropathy.  Colonoscopy 2 or 3 years ago at [facility], normal screening study.  Hx GERD, controlled sxs with Protonix.  No previous EGD. EF 55 to 60%  And no  diastolic dysfx on Echo of 06/2015.     Previous bout with symptomatic gallstones in 03/2016.  Ultrasound gallstones, mild GB wall thickening, vatty liver, 5.3 mm CBD.  Elevated LFTs (AST/ALT 247/122, alk phos 154, T bili 0.7) eventually resolved by 05/24/16.  Dr Vicente Males opted for observation mgt as pt had declined since her TKR in the fall and had no further bouts of pain.    For last 5 days or so having increased upper abdominal distress with bloating. Then brown diarrhea and bilious vomiting.  Overnight pain acutely worse with pain radiating primarily from the upper abdomen into the lower chest. It does not radiate into her back. and brought to ED.  Did not take any meds for sxs.  Does take percocet regularly for MS pain.   AST/ALT 859/867, alk pho 213, t bili 4.7 and lipase 723.   Ultrasound: Cholelithiasis.  7 mm CBD, no CBD stone/defects.  Fatty liver.    No ETOH.   No Fm hx of liver, GB disease, colon CA or gastrointestinal dz.      Past Medical History:  Diagnosis Date  . Anxiety   . Arthritis   . Chronic back pain    stenosis and arthritis  . Complication of anesthesia    anxious when she wakes up  . GERD (gastroesophageal reflux disease)    takes Pantoprazole daily  . Glaucoma    dry angle  . History of bronchitis    30+ yrs ago  . History of MRSA infection 2004   several yrs ago  . Hyperlipidemia    takes Zetia and Crestor daily  . Hypertension    take Amlodipine daily  . Hypothyroidism    takes Synthroid daily  . Joint pain   . Joint swelling   . Neuropathy    takes Gabapentin daily  . Pancreatitis, gallstone   . Peripheral edema    takes Furosemide daily as needed  . Pneumonia    hx of-30 + yrs ago  . Pre-diabetes    takes Victoza daily  . Primary localized osteoarthritis of right knee   . Subdural hematoma (HCC) 8 yrs ago   hx of  . Urinary frequency   . Urinary urgency     Past Surgical History:  Procedure Laterality Date  . ABDOMINAL HYSTERECTOMY    . COLONOSCOPY    . RADIOLOGY WITH ANESTHESIA N/A 08/22/2015   Procedure: MRI OF LUMBAR SPINE   (RADIOLOGY WITH ANESTHESIA);  Surgeon: Medication Radiologist, MD;  Location: [place];  Service: Radiology;  Laterality: N/A;  . TOTAL HIP ARTHROPLASTY Right   . TOTAL HIP ARTHROPLASTY Left   . TOTAL KNEE ARTHROPLASTY Left   . TOTAL KNEE ARTHROPLASTY Right 01/01/2016   Procedure: TOTAL KNEE ARTHROPLASTY;  Surgeon: Elsie Saas, MD;  Location: [facility];  Service: Orthopedics;  Laterality: Right;    Prior to Admission medications   Medication Sig Start Date End Date Taking? Authorizing Provider  acetaminophen (TYLENOL) 325 MG tablet Take 2 tablets (650 mg total) by mouth every 6 (six) hours as needed for mild pain (or Fever >/= 101). 01/04/16   Shepperson, Kirstin, PA-C  amLODipine (NORVASC) 5 MG tablet Take 1 tablet (5 mg total) by mouth daily. 12/04/15   Crecencio Mc, MD    aspirin EC 81 MG tablet Take 81 mg by mouth daily.    [provider]  celecoxib (CELEBREX) 200 MG capsule Take 1 capsule (200 mg total) by mouth daily. 06/27/16   Crecencio Mc,  MD  cycloSPORINE (RESTASIS) 0.05 % ophthalmic emulsion Place 1 drop into both eyes 2 (two) times daily.    [provider]  DULoxetine (CYMBALTA) 30 MG capsule Take 30 mg by mouth 2 (two) times daily. 11/27/15   [provider]  ezetimibe (ZETIA) 10 MG tablet TAKE 1 TABLET BY MOUTH EVERY DAY 05/27/16   Crecencio Mc, MD  gabapentin (NEURONTIN) 300 MG capsule Take 1 capsule (300 mg total) by mouth 2 (two) times daily. 01/04/16   Shepperson, Kirstin, PA-C  glucose blood (ONETOUCH VERIO) test strip USE TO TEST BLOOD SUGAR ONCE DAILY 06/25/16   Crecencio Mc, MD  levothyroxine (SYNTHROID, LEVOTHROID) 200 MCG tablet Take 1 tablet (200 mcg total) by mouth daily before breakfast. With 25 mcg daily  (total daily dose 225 mcg) 06/27/16   Crecencio Mc, MD  liraglutide 18 MG/3ML SOPN Inject 0.1 mLs (0.6 mg total) into the skin daily. Patient not taking: Reported on 07/23/2016 06/28/16 09/26/16  Crecencio Mc, MD  LORazepam (ATIVAN) 0.5 MG tablet TAKE 1 TABLET BY MOUTH AT BEDTIME 08/23/16   Crecencio Mc, MD  LUMIGAN 0.01 % SOLN Place 1-2 drops into both eyes at bedtime. 01/30/16   [provider]  oxyCODONE-acetaminophen (PERCOCET) 10-325 MG tablet Take 1 tablet by mouth daily as needed. May refill on or after September 01 2016 06/26/16   Crecencio Mc, MD  pantoprazole (PROTONIX) 40 MG tablet Take 1 tablet (40 mg total) by mouth daily. 12/04/15   Crecencio Mc, MD  polyethylene glycol (MIRALAX / GLYCOLAX) packet 17grams in 6 oz of water twice a day until bowel movement.  LAXITIVE.  Restart if two days since last bowel movement Patient not taking: Reported on 07/23/2016 01/04/16   Shepperson, Kirstin, PA-C  rosuvastatin (CRESTOR) 20 MG tablet TAKE 1 TABLET BY MOUTH DAILY 03/14/16   Crecencio Mc, MD   sertraline (ZOLOFT) 50 MG tablet Take 1.5 tablets (75 mg total) by mouth daily. 03/27/16   Crecencio Mc, MD  tolterodine (DETROL LA) 4 MG 24 hr capsule TAKE 1 CAPSULE BY MOUTH TWICE DAILY 03/14/16   Crecencio Mc, MD  Vitamin D, Ergocalciferol, (DRISDOL) 50000 units CAPS capsule TAKE 1 CAPSULE (50,000 UNITS TOTAL) BY MOUTH ONCE A WEEK. 08/12/16   Crecencio Mc, MD    Scheduled Meds:  Infusions: . piperacillin-tazobactam     PRN Meds: HYDROmorphone (DILAUDID) injection   Allergies as of 09/04/2016 - Review Complete 09/04/2016  Allergen Reaction Noted  . Erythromycin Rash 01/23/2015  . Iodinated diagnostic agents Swelling and Rash 01/23/2015  . Fentanyl Other (See Comments) 01/23/2015  . Latex Itching and Rash 01/23/2015  . Lisinopril Other (See Comments) 05/27/2015    Family History  Problem Relation Age of Onset  . Cancer Father   . Cancer Brother   . Diabetes Maternal Aunt     Social History   Social History  . Marital status: Widowed    Social History Main Topics  . Smoking status: Never Smoker  . Smokeless tobacco: Never Used  . Alcohol use No  . Drug use: No    REVIEW OF SYSTEMS: Constitutional:  Generally debilitated. She can only walk a few steps and otherwise mobilizes with a wheelchair. This despite having spent time in a rehabilitation facility and participating in physical therapy following her knee surgery in the fall. ENT:  No nose bleeds Pulm:  Hurts to take a deep breath. CV:  No palpitations, no LE edema.  GU:  Tendency to overflow incontinence. GI:  Per HPI.  Normally she has formed, brown stools. Protonix is very effective in controlling her GERD symptoms. No dysphagia. Heme:  No unusual bleeding or bruising.   Transfusions:  None that she recalls and no record of transfusion of blood products in the Epic record. Neuro:  Chronic back pain as well as pain in the knees. Derm:  No itching, no rash or sores.  Endocrine:  No sweats or chills.   No polyuria or dysuria Immunization:  Reviewed. She had pneumococcal vaccination in 03/2015 and influenza vaccination 11/2015. Travel:  None beyond local counties in last few months.    PHYSICAL EXAM: Vital signs in last 24 hours: Vitals:   09/04/16 0930 09/04/16 0945  BP: (!) 101/52 99/63  Pulse: (!) 112 (!) 105  Resp: (!) 26 (!) 24  Temp:     Wt Readings from Last 3 Encounters:  09/04/16 136.1 kg (300 lb)  07/23/16 (!) 137.3 kg (302 lb 9.6 oz)  06/26/16 (!) 142.3 kg (313 lb 12.8 oz)    General: Morbidly obese, unwell, uncomfortable, anxious WF. Head:  No signs of head trauma. No facial asymmetry.  Eyes:  No scleral icterus, no conjunctival pallor. Ears:  Not hard of hearing.  Nose:  No congestion or discharge. Mouth:  Oropharynx is moist and clear. Dentition fair. Has some missing teeth but no extensive dental caries. Oral mucosa is moist, pink, clear. Neck:  No JVD, no thyromegaly, no masses. Lungs:  Clear but overall diminished on the left base. No cough. Slightly labored breathing Heart: RRR. No MR or G. S1-S2 audible. Abdomen:  Obese. Bowel sounds hypoactive. Tenderness primarily in the bilateral upper quadrants and epigastrium. No guarding or rebound. No masses. No HSM. No hernias..   Rectal: Deferred   Musc/Skeltl: Surgical scars and changes of the knees. No joint erythema. No contractures. Extremities:  Non-pitting pedal and lower extremity edema.  Neurologic:  Alert. Anxious. Oriented times 3. Moves all 4 limbs, strength not tested. Skin:  No telangiectasias. No sores, no rashes, no jaundice. Tattoos:  None observed Nodes:  No cervical adenopathy appreciated.   Psych:  Anxious but not agitated.  Intake/Output from previous day: No intake/output data recorded. Intake/Output this shift: Total I/O In: 100 [I.V.:100] Out: -   LAB RESULTS:  Recent Labs  09/04/16 0800  WBC 16.8*  HGB 14.4  HCT 43.4  PLT 287   BMET Lab Results  Component Value Date   NA 133  (L) 09/04/2016   NA 136 04/09/2016   NA 139 04/05/2016   K 3.2 (L) 09/04/2016   K 4.0 04/09/2016   K 3.5 04/05/2016   CL 98 (L) 09/04/2016   CL 101 04/09/2016   CL 104 04/05/2016   CO2 15 (L) 09/04/2016   CO2 25 04/09/2016   CO2 26 04/05/2016   GLUCOSE 184 (H) 09/04/2016   GLUCOSE 114 (H) 04/09/2016   GLUCOSE 138 (H) 04/05/2016   BUN 26 (H) 09/04/2016   BUN 9 04/09/2016   BUN 9 04/05/2016   CREATININE 2.43 (H) 09/04/2016   CREATININE 0.71 04/09/2016   CREATININE 0.72 04/05/2016   CALCIUM 8.1 (L) 09/04/2016   CALCIUM 9.1 04/09/2016   CALCIUM 8.8 (L) 04/05/2016   LFT  Recent Labs  09/04/16 0800  PROT 6.8  ALBUMIN 3.3*  AST 859*  ALT 867*  ALKPHOS 213*  BILITOT 4.7*   Lipase     Component Value Date/Time   LIPASE 723 (H) 09/04/2016 0800  RADIOLOGY STUDIES: US Abdomen Limited Ruq  Result Date: 09/04/2016 CLINICAL DATA:  Right upper quadrant pain EXAM: ULTRASOUND ABDOMEN LIMITED RIGHT UPPER QUADRANT COMPARISON:  Ultrasound abdomen 04/06/2016 FINDINGS: Gallbladder: Cholelithiasis. Largest calculus 13 mm. Negative for gallbladder wall thickening. Negative for sonographic Murphy sign Common bile duct: Diameter: 7 mm Liver: Increased echogenicity of the liver without focal liver lesion. IMPRESSION: Cholelithiasis without evidence of cholecystitis Common bile duct upper normal Echogenic liver compatible with fatty infiltration. Electronically Signed   By: Franchot Gallo M.D.   On: 09/04/2016 10:42

## 2016-09-05 DIAGNOSIS — K8500 Idiopathic acute pancreatitis without necrosis or infection: Secondary | ICD-10-CM

## 2016-09-05 DIAGNOSIS — K7200 Acute and subacute hepatic failure without coma: Secondary | ICD-10-CM

## 2016-09-05 LAB — COMPREHENSIVE METABOLIC PANEL
ALT: 506 U/L — ABNORMAL HIGH (ref 14–54)
AST: 251 U/L — ABNORMAL HIGH (ref 15–41)
Albumin: 2.7 g/dL — ABNORMAL LOW (ref 3.5–5.0)
Alkaline Phosphatase: 154 U/L — ABNORMAL HIGH (ref 38–126)
Anion gap: 9 (ref 5–15)
BUN: 41 mg/dL — ABNORMAL HIGH (ref 6–20)
CO2: 24 mmol/L (ref 22–32)
Calcium: 7.5 mg/dL — ABNORMAL LOW (ref 8.9–10.3)
Chloride: 102 mmol/L (ref 101–111)
Creatinine, Ser: 2.5 mg/dL — ABNORMAL HIGH (ref 0.44–1.00)
GFR calc Af Amer: 21 mL/min — ABNORMAL LOW (ref >60)
GFR calc non Af Amer: 18 mL/min — ABNORMAL LOW (ref >60)
Glucose, Bld: 166 mg/dL — ABNORMAL HIGH (ref 65–99)
Potassium: 4.1 mmol/L (ref 3.5–5.1)
Sodium: 135 mmol/L (ref 135–145)
Total Bilirubin: 2.7 mg/dL — ABNORMAL HIGH (ref 0.3–1.2)
Total Protein: 6.3 g/dL — ABNORMAL LOW (ref 6.5–8.1)

## 2016-09-05 LAB — CBC
HCT: 37.4 % (ref 36.0–46.0)
Hemoglobin: 12.1 g/dL (ref 12.0–15.0)
MCH: 29.3 pg (ref 26.0–34.0)
MCHC: 32.4 g/dL (ref 30.0–36.0)
MCV: 90.6 fL (ref 78.0–100.0)
Platelets: 207 10*3/uL (ref 150–400)
RBC: 4.13 MIL/uL (ref 3.87–5.11)
RDW: 15.2 % (ref 11.5–15.5)
WBC: 14.5 10*3/uL — ABNORMAL HIGH (ref 4.0–10.5)

## 2016-09-05 LAB — HEMOGLOBIN A1C
Hgb A1c MFr Bld: 6.8 % — ABNORMAL HIGH (ref 4.8–5.6)
Mean Plasma Glucose: 148 mg/dL

## 2016-09-05 LAB — GLUCOSE, CAPILLARY
Glucose-Capillary: 104 mg/dL — ABNORMAL HIGH (ref 65–99)
Glucose-Capillary: 112 mg/dL — ABNORMAL HIGH (ref 65–99)
Glucose-Capillary: 119 mg/dL — ABNORMAL HIGH (ref 65–99)
Glucose-Capillary: 165 mg/dL — ABNORMAL HIGH (ref 65–99)
Glucose-Capillary: 171 mg/dL — ABNORMAL HIGH (ref 65–99)
Glucose-Capillary: 171 mg/dL — ABNORMAL HIGH (ref 65–99)

## 2016-09-05 LAB — LIPASE, BLOOD: Lipase: 148 U/L — ABNORMAL HIGH (ref 11–51)

## 2016-09-05 MED ORDER — NALOXONE HCL 0.4 MG/ML IJ SOLN: 0.4000 mg | INTRAMUSCULAR | Status: DC | PRN

## 2016-09-05 MED ORDER — DEXTROSE 5 % IV SOLN
2.0000 g | INTRAVENOUS | Status: DC
  Administered 2016-09-05 – 2016-09-07 (×3): 2 g via INTRAVENOUS
  Filled 2016-09-05 (×3): qty 2

## 2016-09-05 MED ORDER — SODIUM CHLORIDE 0.9% FLUSH: 9.0000 mL | INTRAVENOUS | Status: DC | PRN

## 2016-09-05 MED ORDER — HYDROMORPHONE 1 MG/ML IV SOLN
INTRAVENOUS | Status: DC
  Administered 2016-09-05: 4.2 mg via INTRAVENOUS
  Administered 2016-09-06: 0.3 mg via INTRAVENOUS
  Administered 2016-09-06: 0.9 mg via INTRAVENOUS
  Filled 2016-09-05: qty 25

## 2016-09-05 MED ORDER — ONDANSETRON HCL 4 MG/2ML IJ SOLN: 4.0000 mg | Freq: Four times a day (QID) | INTRAMUSCULAR | Status: DC | PRN

## 2016-09-05 NOTE — Progress Notes (Signed)
   Patient Name: Bonnie GashElizabeth A Hinton Date of Encounter: 09/05/2016, 10:30 AM    Subjective  Bonnie Hinton is awake and reports feeling better than she did yesterday. Denies nausea, vomiting, fever, chills. No diarrhea yesterday or today. Abdomen is still tender, and she is describing a sharp RUQ pain that comes and goes "like a pulse." She says the pain meds help but make it hard to tell whether this RUQ pain is new or has been there. Is asking questions about whether this was caused by a gallstone or liraglutide.    Objective  BP 140/65 (BP Location: Left Arm)   Pulse 100   Temp 98 F (36.7 C) (Oral)   Resp 19   Ht 5\' 5"  (1.651 m)   Wt (!) 316 lb 4 oz (143.5 kg)   SpO2 98%   BMI 52.63 kg/m   General: A little drowsy, had just received dose of dilaudid, but no acute distress.  HEENT: Eyes slightly icteric Neck: Supple, no lymphadenopathy Heart: RRR, no murmur or gallop Lung: Clear bilaterally. No cough or wheeze Abdomen: Soft, but bloated. Hypoactive BS. Very tender in RUQ and epigastric area.    Lab Results  Component Value Date   ALT 506 (H) 09/05/2016   AST 251 (H) 09/05/2016   ALKPHOS 154 (H) 09/05/2016   BILITOT 2.7 (H) 09/05/2016   Lab Results  Component Value Date   LIPASE 148 (H) 09/05/2016      Assessment and Plan   1. Acute pancreatitis - Not clear whether it was caused by a passed gallstone or her liraglutide. MR study had motion artifact but did not show cholecystitis or choledocholithiasis. The RUQ pain she is describing today sounds like biliary colic, but she is not sure whether or not this is new pain. Will consider doing another imaging study. Regardless, lipase has improved - down to 148 today from 723 yesterday. WBC down to 14.5 from 16.8. Continue NPO, IV fluids. Still getting Dilaudid q4 hrs. Check lipase again tomorrow.  2. Elevated LFTs - possible shock liver from 3 days of illness at home, N/V/D. All LFT's and bilirubin have improved today but are  still elevated. Will check them again tomorrow - expect them to continue to improve with hydration.     Brianna Spahn PA-S   I have personally seen the patient, reviewed and repeated key elements of the history and physical and participated in formation of the assessment and plan the student has documented.   The patient is the same to a bit better - labs are better. OK to try clears.  No further role for us but please call us back if needed. Etiology of pancreatitis remains undetermined with certainty - gallstones vs liraglutide.  Iva Booparl E. Dewie Ahart, MD, [facility] [phone]) [phone] after 5 PM, weekends and holidays  09/05/2016 5:50 PM

## 2016-09-05 NOTE — Progress Notes (Signed)
[facility] Progress Note     Subjective: CC: RUQ pain Complaining of RUQ pain today worse with inspiration. Denies nausea/vomting.   Objective: Vital signs in last 24 hours: Temp:  [98 F (36.7 C)-98.8 F (37.1 C)] 98 F (36.7 C) (06/14 0758) Pulse Rate:  [93-102] 93 (06/14 1321) Resp:  [15-24] 21 (06/14 1401) BP: (139-160)/(58-91) 139/58 (06/14 1201) SpO2:  [94 %-99 %] 94 % (06/14 1401) Weight:  [143.5 kg (316 lb 4 oz)] 143.5 kg (316 lb 4 oz) (06/14 0500) Last BM Date: 09/04/16  Intake/Output from previous day: 06/13 0701 - 06/14 0700 In: 3071.7 [I.V.:3071.7] Out: 1150 [Urine:1150] Intake/Output this shift: Total I/O In: 0  Out: 350 [Urine:350]  PE: Gen:  Alert, NAD, pleasant HEENT: EOM's in tact, pupils equal  Card:  Regular rate and rhythm, pedal pulses 2+ BL Pulm:  Normal effort, clear to auscultation bilaterally Abd: Soft, upper abdominal pain epigastrium>RUQ, mild distention, hypoactive bowel sounds Skin: warm and dry, no rashes  Psych: A&Ox3   Lab Results:   Recent Labs  09/04/16 0800 09/05/16 0446  WBC 16.8* 14.5*  HGB 14.4 12.1  HCT 43.4 37.4  PLT 287 207   BMET  Recent Labs  09/04/16 0800 09/05/16 0446  NA 133* 135  K 3.2* 4.1  CL 98* 102  CO2 15* 24  GLUCOSE 184* 166*  BUN 26* 41*  CREATININE 2.43* 2.50*  CALCIUM 8.1* 7.5*   PT/INR  Recent Labs  09/04/16 1442  LABPROT 17.0*  INR 1.37   CMP     Component Value Date/Time   NA 135 09/05/2016 0446   NA 139 01/24/2016   K 4.1 09/05/2016 0446   CL 102 09/05/2016 0446   CO2 24 09/05/2016 0446   GLUCOSE 166 (H) 09/05/2016 0446   BUN 41 (H) 09/05/2016 0446   BUN 24 (A) 01/24/2016   CREATININE 2.50 (H) 09/05/2016 0446   CALCIUM 7.5 (L) 09/05/2016 0446   PROT 6.3 (L) 09/05/2016 0446   ALBUMIN 2.7 (L) 09/05/2016 0446   AST 251 (H) 09/05/2016 0446   ALT 506 (H) 09/05/2016 0446   ALKPHOS 154 (H) 09/05/2016 0446   BILITOT 2.7 (H) 09/05/2016 0446   GFRNONAA 18 (L)  09/05/2016 0446   GFRAA 21 (L) 09/05/2016 0446   Lipase     Component Value Date/Time   LIPASE 148 (H) 09/05/2016 0446       Studies/Results: Dg Chest 2 View  Result Date: 09/04/2016 CLINICAL DATA:  Preop chest exam, bronchitis. EXAM: CHEST  2 VIEW COMPARISON:  None. FINDINGS: The heart size and mediastinal contours are within normal limits. Low lung volumes with mild increase in interstitial prominence suspicious for chronic bronchitic change. No pneumonic consolidation, effusion or pneumothorax. Mild degenerative change along the dorsal spine. IMPRESSION: Mild interstitial prominence bilaterally which may reflect chronic bronchitic change. No pulmonary consolidations to suggest pneumonia. Electronically Signed   By: Tollie Ethavid  Kwon M.D.   On: 09/04/2016 13:51   Mr Abdomen Mrcp Wo Contrast  Result Date: 09/04/2016 CLINICAL DATA:  Right upper quadrant abdominal pain. Pancreatitis with elevated liver function studies. EXAM: MRI ABDOMEN WITHOUT CONTRAST  (INCLUDING MRCP) TECHNIQUE: Multiplanar multisequence MR imaging of the abdomen was performed. Heavily T2-weighted images of the biliary and pancreatic ducts were obtained, and three-dimensional MRCP images were rendered by post processing. COMPARISON:  Ultrasound today and 04/06/2016. FINDINGS: Despite efforts by the technologist and patient, moderate motion artifact is present on today's exam and could not be eliminated. This reduces exam sensitivity  and specificity. Lower chest: The visualized lower chest appears remarkable. No significant pleural effusion. Hepatobiliary: The liver demonstrates loss of signal on the gradient echo opposed phase images consistent with steatosis. No focal lesions are identified. Multiple small gallstones are present. There is no gallbladder wall thickening or surrounding inflammation. The ERCP images are degraded by motion. No significant biliary dilatation. No definite choledocholithiasis. Pancreas: Fatty replaced with  mild surrounding edema. No focal fluid collection or ductal dilatation identified. Spleen: Normal in size without focal abnormality. Adrenals/Urinary Tract: Both adrenal glands appear normal. Tiny cyst in the lower pole of the left kidney. The right kidney appears normal. No hydronephrosis. Stomach/Bowel: No evidence of bowel wall thickening, distention or surrounding inflammatory change. Vascular/Lymphatic: Small lymph nodes in the porta hepatis, likely reactive. No enlarged retroperitoneal lymph nodes. No significant vascular findings on noncontrast imaging identified. Other: No ascites. Musculoskeletal: No acute or significant osseous findings. IMPRESSION: 1. Motion artifact results in suboptimal evaluation of the biliary system. Cholelithiasis without evidence of cholecystitis, significant biliary dilatation or definite choledocholithiasis. 2. Hepatic steatosis. 3. Peripancreatic edema consistent with pancreatitis. No focal fluid collection. Electronically Signed   By: Carey Bullocks M.D.   On: 09/04/2016 13:53   Mr 3d Recon At Scanner  Result Date: 09/04/2016 CLINICAL DATA:  Right upper quadrant abdominal pain. Pancreatitis with elevated liver function studies. EXAM: MRI ABDOMEN WITHOUT CONTRAST  (INCLUDING MRCP) TECHNIQUE: Multiplanar multisequence MR imaging of the abdomen was performed. Heavily T2-weighted images of the biliary and pancreatic ducts were obtained, and three-dimensional MRCP images were rendered by post processing. COMPARISON:  Ultrasound today and 04/06/2016. FINDINGS: Despite efforts by the technologist and patient, moderate motion artifact is present on today's exam and could not be eliminated. This reduces exam sensitivity and specificity. Lower chest: The visualized lower chest appears remarkable. No significant pleural effusion. Hepatobiliary: The liver demonstrates loss of signal on the gradient echo opposed phase images consistent with steatosis. No focal lesions are identified.  Multiple small gallstones are present. There is no gallbladder wall thickening or surrounding inflammation. The ERCP images are degraded by motion. No significant biliary dilatation. No definite choledocholithiasis. Pancreas: Fatty replaced with mild surrounding edema. No focal fluid collection or ductal dilatation identified. Spleen: Normal in size without focal abnormality. Adrenals/Urinary Tract: Both adrenal glands appear normal. Tiny cyst in the lower pole of the left kidney. The right kidney appears normal. No hydronephrosis. Stomach/Bowel: No evidence of bowel wall thickening, distention or surrounding inflammatory change. Vascular/Lymphatic: Small lymph nodes in the porta hepatis, likely reactive. No enlarged retroperitoneal lymph nodes. No significant vascular findings on noncontrast imaging identified. Other: No ascites. Musculoskeletal: No acute or significant osseous findings. IMPRESSION: 1. Motion artifact results in suboptimal evaluation of the biliary system. Cholelithiasis without evidence of cholecystitis, significant biliary dilatation or definite choledocholithiasis. 2. Hepatic steatosis. 3. Peripancreatic edema consistent with pancreatitis. No focal fluid collection. Electronically Signed   By: Carey Bullocks M.D.   On: 09/04/2016 13:53   US Abdomen Limited Ruq  Result Date: 09/04/2016 CLINICAL DATA:  Right upper quadrant pain EXAM: ULTRASOUND ABDOMEN LIMITED RIGHT UPPER QUADRANT COMPARISON:  Ultrasound abdomen 04/06/2016 FINDINGS: Gallbladder: Cholelithiasis. Largest calculus 13 mm. Negative for gallbladder wall thickening. Negative for sonographic Murphy sign Common bile duct: Diameter: 7 mm Liver: Increased echogenicity of the liver without focal liver lesion. IMPRESSION: Cholelithiasis without evidence of cholecystitis Common bile duct upper normal Echogenic liver compatible with fatty infiltration. Electronically Signed   By: Marlan Palau M.D.   On: 09/04/2016 10:42  Anti-infectives: Anti-infectives    Start     Dose/Rate Route Frequency Ordered Stop   09/04/16 1330  cefTRIAXone (ROCEPHIN) 2 g in dextrose 5 % 50 mL IVPB  Status:  Discontinued     2 g 100 mL/hr over 30 Minutes Intravenous Every 24 hours 09/04/16 1328 09/04/16 1535   09/04/16 1115  piperacillin-tazobactam (ZOSYN) IVPB 3.375 g  Status:  Discontinued     3.375 g 100 mL/hr over 30 Minutes Intravenous  Once 09/04/16 1114 09/04/16 1327     Assessment/Plan Gallstone pancreatitis - lipase 148, improving - still significantly tender - recommend continued bowel rest, IV fluids, and PCA for pain - plan for laparoscopic cholecystectomy with IOC once she improves clinically  Hyperbilirubinemia - likely passed a CBD stone, MRCP negative for CBD stone/dilatation  Leukocytosis - 14.5 from 16.8, recommend IV Rocephin for possible cholecystitis  FEN: NPO, IVF ID: Rocephin VTE: SQ heparin, SCD's    LOS: 1 day    Adam Phenix , [facility] 09/05/2016, 3:14 PM Pager: [phone] Consults: [phone] Mon-Fri 7:00 am-4:30 pm Sat-Sun 7:00 am-11:30 am

## 2016-09-05 NOTE — Progress Notes (Addendum)
PROGRESS NOTE    Bonnie Hinton  WUJ:[id number] DOB: January 03, 1941 DOA: 09/04/2016 PCP: Sherlene Shams, MD  Brief Narrative:Bonnie Hinton is a 76 y.o. female with medical history significant for hypertension, hyperlipidemia diabetes, hypothyroidism, cholelithiasis, GERD, chronic back pain presented to emergency department with worsening abdominal pain. Noted to have acute kidney injury, leukocytosis, hypokalemia, acute gallstone pancreatitis   Assessment & Plan:   Principal Problem:   Pancreatitis, gallstone -suspect passed stone, LFTS improving -Gi concerned that Victoza could have contributed to pancreatitis  -MRCP without CBD stone/obstruction, cholelithiases noted -lipase improving, continue NPO, IVF, pain control -CCS following and will decide on timing of lap chole  AKI -due to pancreatitis, ? ATN -monitor with hydration, good UO -baseline normal -continue IVF as above -no hydronephrosis noted on MRI    Diabetes mellitus type 2, controlled (HCC) -holding victoza, SSI now    Hypertension -Hold Norvasc for now -Monitor   Hypothyroidism.  -synthroid 100 mcg IV   Obesity/Hx fatty liver  DVT prophylaxis:Hep SQ Code Status: Full Code Family Communication:None at bedside Disposition Plan: Home pending improvement, lap chole  Consultants: GI , CCS  Subjective: Still with abd pain but improving, some nausea, no vomiting, breathing ok  Objective: Vitals:   09/05/16 0500 09/05/16 0620 09/05/16 0758 09/05/16 0801  BP:  (!) 160/91 140/65   Pulse:  (!) 102 100   Resp:  (!) 21 20 19   Temp:  98.2 F (36.8 C) 98 F (36.7 C)   TempSrc:  Oral Oral   SpO2:  98% 98% 98%  Weight: (!) 143.5 kg (316 lb 4 oz)     Height:        Intake/Output Summary (Last 24 hours) at 09/05/16 1201 Last data filed at 09/05/16 1010  Gross per 24 hour  Intake          2971.66 ml  Output             1500 ml  Net          1471.66 ml   Filed Weights   09/04/16 0729 09/04/16  1450 09/05/16 0500  Weight: 136.1 kg (300 lb) (!) 142.2 kg (313 lb 6.4 oz) (!) 143.5 kg (316 lb 4 oz)    Examination:  General exam: Appears calm and comfortable  Respiratory system: Clear to auscultation. Respiratory effort normal. Cardiovascular system: S1 & S2 heard, RRR. No JVD, murmurs, rubs, gallops or clicks. No pedal edema. Gastrointestinal system: Abdomen is nondistended, soft and nontender. No organomegaly or masses felt. Normal bowel sounds heard. Central nervous system: Alert and oriented. No focal neurological deficits. Extremities: Symmetric 5 x 5 power. Skin: No rashes, lesions or ulcers Psychiatry: Judgement and insight appear normal. Mood & affect appropriate.     Data Reviewed:   CBC:  Recent Labs Lab 09/04/16 0800 09/05/16 0446  WBC 16.8* 14.5*  NEUTROABS 15.7*  --   HGB 14.4 12.1  HCT 43.4 37.4  MCV 89.9 90.6  PLT 287 207   Basic Metabolic Panel:  Recent Labs Lab 09/04/16 0800 09/05/16 0446  NA 133* 135  K 3.2* 4.1  CL 98* 102  CO2 15* 24  GLUCOSE 184* 166*  BUN 26* 41*  CREATININE 2.43* 2.50*  CALCIUM 8.1* 7.5*   GFR: Estimated Creatinine Clearance: 28.1 mL/min (A) (by C-G formula based on SCr of 2.5 mg/dL (H)). Liver Function Tests:  Recent Labs Lab 09/04/16 0800 09/05/16 0446  AST 859* 251*  ALT 867* 506*  ALKPHOS 213* 154*  BILITOT  4.7* 2.7*  PROT 6.8 6.3*  ALBUMIN 3.3* 2.7*    Recent Labs Lab 09/04/16 0800 09/05/16 0446  LIPASE 723* 148*   No results for input(s): AMMONIA in the last 168 hours. Coagulation Profile:  Recent Labs Lab 09/04/16 1442  INR 1.37   Cardiac Enzymes: No results for input(s): CKTOTAL, CKMB, CKMBINDEX, TROPONINI in the last 168 hours. BNP (last 3 results) No results for input(s): PROBNP in the last 8760 hours. HbA1C:  Recent Labs  09/04/16 1200  HGBA1C 6.8*   CBG:  Recent Labs Lab 09/04/16 2049 09/05/16 0034 09/05/16 0418 09/05/16 0755 09/05/16 1158  GLUCAP 209* 171* 171*  165* 112*   Lipid Profile:  Recent Labs  09/04/16 1201  CHOL 113  HDL 71  LDLCALC 19  TRIG 117  CHOLHDL 1.6   Thyroid Function Tests:  Recent Labs  09/04/16 1442  TSH 1.461   Anemia Panel: No results for input(s): VITAMINB12, FOLATE, FERRITIN, TIBC, IRON, RETICCTPCT in the last 72 hours. Urine analysis:    Component Value Date/Time   COLORURINE AMBER (A) 09/04/2016 1616   APPEARANCEUR CLOUDY (A) 09/04/2016 1616   LABSPEC 1.020 09/04/2016 1616   PHURINE 5.0 09/04/2016 1616   GLUCOSEU 50 (A) 09/04/2016 1616   GLUCOSEU NEGATIVE 04/25/2015 1359   HGBUR MODERATE (A) 09/04/2016 1616   BILIRUBINUR SMALL (A) 09/04/2016 1616   BILIRUBINUR neg 04/25/2015 1450   KETONESUR NEGATIVE 09/04/2016 1616   PROTEINUR 100 (A) 09/04/2016 1616   UROBILINOGEN 0.2 04/25/2015 1450   UROBILINOGEN 0.2 04/25/2015 1359   NITRITE NEGATIVE 09/04/2016 1616   LEUKOCYTESUR TRACE (A) 09/04/2016 1616   Sepsis Labs: @LABRCNTIP (procalcitonin:4,lacticidven:4)  ) Recent Results (from the past 240 hour(s))  MRSA PCR Screening     Status: None   Collection Time: 09/04/16  4:53 PM  Result Value Ref Range Status   MRSA by PCR NEGATIVE NEGATIVE Final    Comment:        The GeneXpert MRSA Assay (FDA approved for NASAL specimens only), is one component of a comprehensive MRSA colonization surveillance program. It is not intended to diagnose MRSA infection nor to guide or monitor treatment for MRSA infections.          Radiology Studies: Dg Chest 2 View  Result Date: 09/04/2016 CLINICAL DATA:  Preop chest exam, bronchitis. EXAM: CHEST  2 VIEW COMPARISON:  None. FINDINGS: The heart size and mediastinal contours are within normal limits. Low lung volumes with mild increase in interstitial prominence suspicious for chronic bronchitic change. No pneumonic consolidation, effusion or pneumothorax. Mild degenerative change along the dorsal spine. IMPRESSION: Mild interstitial prominence bilaterally  which may reflect chronic bronchitic change. No pulmonary consolidations to suggest pneumonia. Electronically Signed   By: Tollie Ethavid  Kwon M.D.   On: 09/04/2016 13:51   Mr Abdomen Mrcp Wo Contrast  Result Date: 09/04/2016 CLINICAL DATA:  Right upper quadrant abdominal pain. Pancreatitis with elevated liver function studies. EXAM: MRI ABDOMEN WITHOUT CONTRAST  (INCLUDING MRCP) TECHNIQUE: Multiplanar multisequence MR imaging of the abdomen was performed. Heavily T2-weighted images of the biliary and pancreatic ducts were obtained, and three-dimensional MRCP images were rendered by post processing. COMPARISON:  Ultrasound today and 04/06/2016. FINDINGS: Despite efforts by the technologist and patient, moderate motion artifact is present on today's exam and could not be eliminated. This reduces exam sensitivity and specificity. Lower chest: The visualized lower chest appears remarkable. No significant pleural effusion. Hepatobiliary: The liver demonstrates loss of signal on the gradient echo opposed phase images consistent with steatosis. No  focal lesions are identified. Multiple small gallstones are present. There is no gallbladder wall thickening or surrounding inflammation. The ERCP images are degraded by motion. No significant biliary dilatation. No definite choledocholithiasis. Pancreas: Fatty replaced with mild surrounding edema. No focal fluid collection or ductal dilatation identified. Spleen: Normal in size without focal abnormality. Adrenals/Urinary Tract: Both adrenal glands appear normal. Tiny cyst in the lower pole of the left kidney. The right kidney appears normal. No hydronephrosis. Stomach/Bowel: No evidence of bowel wall thickening, distention or surrounding inflammatory change. Vascular/Lymphatic: Small lymph nodes in the porta hepatis, likely reactive. No enlarged retroperitoneal lymph nodes. No significant vascular findings on noncontrast imaging identified. Other: No ascites. Musculoskeletal: No  acute or significant osseous findings. IMPRESSION: 1. Motion artifact results in suboptimal evaluation of the biliary system. Cholelithiasis without evidence of cholecystitis, significant biliary dilatation or definite choledocholithiasis. 2. Hepatic steatosis. 3. Peripancreatic edema consistent with pancreatitis. No focal fluid collection. Electronically Signed   By: Carey Bullocks M.D.   On: 09/04/2016 13:53   Mr 3d Recon At Scanner  Result Date: 09/04/2016 CLINICAL DATA:  Right upper quadrant abdominal pain. Pancreatitis with elevated liver function studies. EXAM: MRI ABDOMEN WITHOUT CONTRAST  (INCLUDING MRCP) TECHNIQUE: Multiplanar multisequence MR imaging of the abdomen was performed. Heavily T2-weighted images of the biliary and pancreatic ducts were obtained, and three-dimensional MRCP images were rendered by post processing. COMPARISON:  Ultrasound today and 04/06/2016. FINDINGS: Despite efforts by the technologist and patient, moderate motion artifact is present on today's exam and could not be eliminated. This reduces exam sensitivity and specificity. Lower chest: The visualized lower chest appears remarkable. No significant pleural effusion. Hepatobiliary: The liver demonstrates loss of signal on the gradient echo opposed phase images consistent with steatosis. No focal lesions are identified. Multiple small gallstones are present. There is no gallbladder wall thickening or surrounding inflammation. The ERCP images are degraded by motion. No significant biliary dilatation. No definite choledocholithiasis. Pancreas: Fatty replaced with mild surrounding edema. No focal fluid collection or ductal dilatation identified. Spleen: Normal in size without focal abnormality. Adrenals/Urinary Tract: Both adrenal glands appear normal. Tiny cyst in the lower pole of the left kidney. The right kidney appears normal. No hydronephrosis. Stomach/Bowel: No evidence of bowel wall thickening, distention or surrounding  inflammatory change. Vascular/Lymphatic: Small lymph nodes in the porta hepatis, likely reactive. No enlarged retroperitoneal lymph nodes. No significant vascular findings on noncontrast imaging identified. Other: No ascites. Musculoskeletal: No acute or significant osseous findings. IMPRESSION: 1. Motion artifact results in suboptimal evaluation of the biliary system. Cholelithiasis without evidence of cholecystitis, significant biliary dilatation or definite choledocholithiasis. 2. Hepatic steatosis. 3. Peripancreatic edema consistent with pancreatitis. No focal fluid collection. Electronically Signed   By: Carey Bullocks M.D.   On: 09/04/2016 13:53   US Abdomen Limited Ruq  Result Date: 09/04/2016 CLINICAL DATA:  Right upper quadrant pain EXAM: ULTRASOUND ABDOMEN LIMITED RIGHT UPPER QUADRANT COMPARISON:  Ultrasound abdomen 04/06/2016 FINDINGS: Gallbladder: Cholelithiasis. Largest calculus 13 mm. Negative for gallbladder wall thickening. Negative for sonographic Murphy sign Common bile duct: Diameter: 7 mm Liver: Increased echogenicity of the liver without focal liver lesion. IMPRESSION: Cholelithiasis without evidence of cholecystitis Common bile duct upper normal Echogenic liver compatible with fatty infiltration. Electronically Signed   By: Marlan Palau M.D.   On: 09/04/2016 10:42        Scheduled Meds: . cycloSPORINE  1 drop Both Eyes BID  . heparin  5,000 Units Subcutaneous Q8H  . HYDROmorphone   Intravenous Q4H  .  insulin aspart  0-9 Units Subcutaneous Q4H  . levothyroxine  100 mcg Intravenous Daily  . sodium chloride flush  3 mL Intravenous Q12H   Continuous Infusions: . dextrose 5% lactated ringers 150 mL/hr at 09/05/16 1136  . sodium chloride       LOS: 1 day    Time spent:    Zannie Cove, MD [facility] Pager [phone]  If 7PM-7AM, please contact night-coverage [email or web address] Password [email or web address] 09/05/2016, 12:01 PM

## 2016-09-05 NOTE — Progress Notes (Signed)
Bolus of 0.3 of PCA dilaudid given, patient starting to have some pain relieve will continue to monitor

## 2016-09-06 ENCOUNTER — Inpatient Hospital Stay (HOSPITAL_COMMUNITY): Payer: Medicare Other

## 2016-09-06 LAB — COMPREHENSIVE METABOLIC PANEL
ALT: 298 U/L — ABNORMAL HIGH (ref 14–54)
AST: 77 U/L — ABNORMAL HIGH (ref 15–41)
Albumin: 2.5 g/dL — ABNORMAL LOW (ref 3.5–5.0)
Alkaline Phosphatase: 145 U/L — ABNORMAL HIGH (ref 38–126)
Anion gap: 10 (ref 5–15)
BUN: 33 mg/dL — ABNORMAL HIGH (ref 6–20)
CO2: 24 mmol/L (ref 22–32)
Calcium: 7.8 mg/dL — ABNORMAL LOW (ref 8.9–10.3)
Chloride: 103 mmol/L (ref 101–111)
Creatinine, Ser: 1.66 mg/dL — ABNORMAL HIGH (ref 0.44–1.00)
GFR calc Af Amer: 34 mL/min — ABNORMAL LOW (ref >60)
GFR calc non Af Amer: 29 mL/min — ABNORMAL LOW (ref >60)
Glucose, Bld: 175 mg/dL — ABNORMAL HIGH (ref 65–99)
Potassium: 4.4 mmol/L (ref 3.5–5.1)
Sodium: 137 mmol/L (ref 135–145)
Total Bilirubin: 1.2 mg/dL (ref 0.3–1.2)
Total Protein: 6.4 g/dL — ABNORMAL LOW (ref 6.5–8.1)

## 2016-09-06 LAB — GLUCOSE, CAPILLARY
Glucose-Capillary: 118 mg/dL — ABNORMAL HIGH (ref 65–99)
Glucose-Capillary: 136 mg/dL — ABNORMAL HIGH (ref 65–99)
Glucose-Capillary: 137 mg/dL — ABNORMAL HIGH (ref 65–99)
Glucose-Capillary: 144 mg/dL — ABNORMAL HIGH (ref 65–99)
Glucose-Capillary: 149 mg/dL — ABNORMAL HIGH (ref 65–99)
Glucose-Capillary: 166 mg/dL — ABNORMAL HIGH (ref 65–99)

## 2016-09-06 LAB — CBC
HCT: 37.9 % (ref 36.0–46.0)
Hemoglobin: 11.9 g/dL — ABNORMAL LOW (ref 12.0–15.0)
MCH: 28.9 pg (ref 26.0–34.0)
MCHC: 31.4 g/dL (ref 30.0–36.0)
MCV: 92 fL (ref 78.0–100.0)
Platelets: 183 10*3/uL (ref 150–400)
RBC: 4.12 MIL/uL (ref 3.87–5.11)
RDW: 15.2 % (ref 11.5–15.5)
WBC: 9.9 10*3/uL (ref 4.0–10.5)

## 2016-09-06 LAB — LIPASE, BLOOD: Lipase: 69 U/L — ABNORMAL HIGH (ref 11–51)

## 2016-09-06 MED ORDER — HYDROMORPHONE HCL 1 MG/ML IJ SOLN
1.0000 mg | INTRAMUSCULAR | Status: DC | PRN
  Administered 2016-09-06 – 2016-09-09 (×15): 1 mg via INTRAVENOUS
  Filled 2016-09-06 (×16): qty 1

## 2016-09-06 MED ORDER — OXYCODONE HCL 5 MG PO TABS
5.0000 mg | ORAL_TABLET | Freq: Four times a day (QID) | ORAL | Status: DC | PRN
  Administered 2016-09-08: 5 mg via ORAL
  Filled 2016-09-06: qty 1

## 2016-09-06 MED ORDER — ALBUTEROL SULFATE (2.5 MG/3ML) 0.083% IN NEBU
INHALATION_SOLUTION | RESPIRATORY_TRACT | Status: AC
  Administered 2016-09-06: 2.5 mg
  Filled 2016-09-06: qty 3

## 2016-09-06 NOTE — Significant Event (Signed)
Rapid Response Event Note  Overview:  Responded to code Blue Time Called: 1419 Arrival Time: 1420 Event Type: Respiratory  Initial Focused Assessment:  On unit doing rounds, and Code Blue called.  On my arrival to patients room, NT was in room.  AS per NT, she was walking by room and noticed patient was cyanotic and off her oxygen and called code blue.  AS patient was being placed in supine position, patient started coughing and breathing.     Interventions:  Patient placed on her nasal cannula and coughing.  HR 104, 164/86, 97% on 5 LPM.  Patient with continuous cough, as per RT slightly wheezy.  Breathing treatment given.  As per patient states she chocked on tea.  PCXR ordered.  Dr. Jomarie LongsJoseph called to bedside.  Plan of Care (if not transferred):  Orders given by Dr. Jomarie LongsJoseph  Event Summary:  RN to call if assistance needed   at      at          [facility] Maryagnes Amosenise Ann

## 2016-09-06 NOTE — Progress Notes (Signed)
Code blue called, upon arriving to room code was canceled.  RN reported that pt became choked, pt reports getting choked on liquid.  Pt is awake and talking, coughing.  BBSH diminished in bases and exp. wheeze t/o (sounds upper airway).  Per Rapid response protocol and MD- stat albuterol HHN given x1.  Sat 100%, pt placed back on 2 lpm [place] s/p treatment.  MD at bedside and no new RT orders currently received.  Floor RT notified of incident.

## 2016-09-06 NOTE — Progress Notes (Signed)
8 ml of hydromorphone was wasted, witnessed by Delcine the charge nurse.

## 2016-09-06 NOTE — Progress Notes (Signed)
PCA hydromophone stopped per Doctor Jomarie LongsJoseph order.

## 2016-09-06 NOTE — Progress Notes (Signed)
Patient ID: Bonnie GashElizabeth A Hinton, female   DOB: 1941-03-08, 76 y.o.   MRN: [id number]  [name]     Subjective: CC- gallstone pancreatitis, abdominal pain Patient reports persistent RUQ abdominal pain today. Pain is constant and worse with inspiration. Denies n/v. Started on clear liquids yesterday which she is tolerating. Patient states that her pain is improved since admission, but not significantly changed from yesterday.  Objective: Vital signs in last 24 hours: Temp:  [97.8 F (36.6 C)-98.7 F (37.1 C)] 98.7 F (37.1 C) (06/15 0500) Pulse Rate:  [90-93] 93 (06/15 0500) Resp:  [17-24] 21 (06/15 0508) BP: (139-172)/(58-80) 172/68 (06/15 0500) SpO2:  [93 %-98 %] 94 % (06/15 0508) Weight:  [318 lb 12.8 oz (144.6 kg)] 318 lb 12.8 oz (144.6 kg) (06/15 0500) Last BM Date: 09/04/16  Intake/Output from previous day: 06/14 0701 - 06/15 0700 In: 1736.3 [I.V.:1686.3; IV Piggyback:50] Out: 350 [Urine:350] Intake/Output this shift: No intake/output data recorded.  PE: Gen:  Alert, NAD, pleasant HEENT: EOM's in tact, pupils equal Card:  RRR, no M/G/R heard Pulm:  CTAB, no W/R/R, effort normal Abd: Soft, obese, mild distension, +BS, no HSM, no hernia, +TTP RUQ/RLQ/epigastric region Ext:  No erythema, edema, or tenderness BUE/BLE   Lab Results:   Recent Labs  09/05/16 0446 09/06/16 0504  WBC 14.5* 9.9  HGB 12.1 11.9*  HCT 37.4 37.9  PLT 207 183   BMET  Recent Labs  09/05/16 0446 09/06/16 0504  NA 135 137  K 4.1 4.4  CL 102 103  CO2 24 24  GLUCOSE 166* 175*  BUN 41* 33*  CREATININE 2.50* 1.66*  CALCIUM 7.5* 7.8*   PT/INR  Recent Labs  09/04/16 1442  LABPROT 17.0*  INR 1.37   CMP     Component Value Date/Time   NA 137 09/06/2016 0504   NA 139 01/24/2016   K 4.4 09/06/2016 0504   CL 103 09/06/2016 0504   CO2 24 09/06/2016 0504   GLUCOSE 175 (H) 09/06/2016 0504   BUN 33 (H) 09/06/2016 0504   BUN 24 (A) 01/24/2016    CREATININE 1.66 (H) 09/06/2016 0504   CALCIUM 7.8 (L) 09/06/2016 0504   PROT 6.4 (L) 09/06/2016 0504   ALBUMIN 2.5 (L) 09/06/2016 0504   AST 77 (H) 09/06/2016 0504   ALT 298 (H) 09/06/2016 0504   ALKPHOS 145 (H) 09/06/2016 0504   BILITOT 1.2 09/06/2016 0504   GFRNONAA 29 (L) 09/06/2016 0504   GFRAA 34 (L) 09/06/2016 0504   Lipase     Component Value Date/Time   LIPASE 69 (H) 09/06/2016 0504       Studies/Results: Dg Chest 2 View  Result Date: 09/04/2016 CLINICAL DATA:  Preop chest exam, bronchitis. EXAM: CHEST  2 VIEW COMPARISON:  None. FINDINGS: The heart size and mediastinal contours are within normal limits. Low lung volumes with mild increase in interstitial prominence suspicious for chronic bronchitic change. No pneumonic consolidation, effusion or pneumothorax. Mild degenerative change along the dorsal spine. IMPRESSION: Mild interstitial prominence bilaterally which may reflect chronic bronchitic change. No pulmonary consolidations to suggest pneumonia. Electronically Signed   By: Tollie Ethavid  Kwon M.D.   On: 09/04/2016 13:51   Mr Abdomen Mrcp Wo Contrast  Result Date: 09/04/2016 CLINICAL DATA:  Right upper quadrant abdominal pain. Pancreatitis with elevated liver function studies. EXAM: MRI ABDOMEN WITHOUT CONTRAST  (INCLUDING MRCP) TECHNIQUE: Multiplanar multisequence MR imaging of the abdomen was performed. Heavily T2-weighted images of the biliary and pancreatic ducts were obtained,  and three-dimensional MRCP images were rendered by post processing. COMPARISON:  Ultrasound today and 04/06/2016. FINDINGS: Despite efforts by the technologist and patient, moderate motion artifact is present on today's exam and could not be eliminated. This reduces exam sensitivity and specificity. Lower chest: The visualized lower chest appears remarkable. No significant pleural effusion. Hepatobiliary: The liver demonstrates loss of signal on the gradient echo opposed phase images consistent with  steatosis. No focal lesions are identified. Multiple small gallstones are present. There is no gallbladder wall thickening or surrounding inflammation. The ERCP images are degraded by motion. No significant biliary dilatation. No definite choledocholithiasis. Pancreas: Fatty replaced with mild surrounding edema. No focal fluid collection or ductal dilatation identified. Spleen: Normal in size without focal abnormality. Adrenals/Urinary Tract: Both adrenal glands appear normal. Tiny cyst in the lower pole of the left kidney. The right kidney appears normal. No hydronephrosis. Stomach/Bowel: No evidence of bowel wall thickening, distention or surrounding inflammatory change. Vascular/Lymphatic: Small lymph nodes in the porta hepatis, likely reactive. No enlarged retroperitoneal lymph nodes. No significant vascular findings on noncontrast imaging identified. Other: No ascites. Musculoskeletal: No acute or significant osseous findings. IMPRESSION: 1. Motion artifact results in suboptimal evaluation of the biliary system. Cholelithiasis without evidence of cholecystitis, significant biliary dilatation or definite choledocholithiasis. 2. Hepatic steatosis. 3. Peripancreatic edema consistent with pancreatitis. No focal fluid collection. Electronically Signed   By: Carey Bullocks M.D.   On: 09/04/2016 13:53   Mr 3d Recon At Scanner  Result Date: 09/04/2016 CLINICAL DATA:  Right upper quadrant abdominal pain. Pancreatitis with elevated liver function studies. EXAM: MRI ABDOMEN WITHOUT CONTRAST  (INCLUDING MRCP) TECHNIQUE: Multiplanar multisequence MR imaging of the abdomen was performed. Heavily T2-weighted images of the biliary and pancreatic ducts were obtained, and three-dimensional MRCP images were rendered by post processing. COMPARISON:  Ultrasound today and 04/06/2016. FINDINGS: Despite efforts by the technologist and patient, moderate motion artifact is present on today's exam and could not be eliminated. This  reduces exam sensitivity and specificity. Lower chest: The visualized lower chest appears remarkable. No significant pleural effusion. Hepatobiliary: The liver demonstrates loss of signal on the gradient echo opposed phase images consistent with steatosis. No focal lesions are identified. Multiple small gallstones are present. There is no gallbladder wall thickening or surrounding inflammation. The ERCP images are degraded by motion. No significant biliary dilatation. No definite choledocholithiasis. Pancreas: Fatty replaced with mild surrounding edema. No focal fluid collection or ductal dilatation identified. Spleen: Normal in size without focal abnormality. Adrenals/Urinary Tract: Both adrenal glands appear normal. Tiny cyst in the lower pole of the left kidney. The right kidney appears normal. No hydronephrosis. Stomach/Bowel: No evidence of bowel wall thickening, distention or surrounding inflammatory change. Vascular/Lymphatic: Small lymph nodes in the porta hepatis, likely reactive. No enlarged retroperitoneal lymph nodes. No significant vascular findings on noncontrast imaging identified. Other: No ascites. Musculoskeletal: No acute or significant osseous findings. IMPRESSION: 1. Motion artifact results in suboptimal evaluation of the biliary system. Cholelithiasis without evidence of cholecystitis, significant biliary dilatation or definite choledocholithiasis. 2. Hepatic steatosis. 3. Peripancreatic edema consistent with pancreatitis. No focal fluid collection. Electronically Signed   By: Carey Bullocks M.D.   On: 09/04/2016 13:53   US Abdomen Limited Ruq  Result Date: 09/04/2016 CLINICAL DATA:  Right upper quadrant pain EXAM: ULTRASOUND ABDOMEN LIMITED RIGHT UPPER QUADRANT COMPARISON:  Ultrasound abdomen 04/06/2016 FINDINGS: Gallbladder: Cholelithiasis. Largest calculus 13 mm. Negative for gallbladder wall thickening. Negative for sonographic Murphy sign Common bile duct: Diameter: 7 mm Liver:  Increased echogenicity of the liver without focal liver lesion. IMPRESSION: Cholelithiasis without evidence of cholecystitis Common bile duct upper normal Echogenic liver compatible with fatty infiltration. Electronically Signed   By: Marlan Palau M.D.   On: 09/04/2016 10:42    Anti-infectives: Anti-infectives    Start     Dose/Rate Route Frequency Ordered Stop   09/05/16 1630  cefTRIAXone (ROCEPHIN) 2 g in dextrose 5 % 50 mL IVPB    Comments:  Pharmacy may adjust dosing strength / duration / interval for maximal efficacy   2 g 100 mL/hr over 30 Minutes Intravenous Every 24 hours 09/05/16 1520     09/04/16 1330  cefTRIAXone (ROCEPHIN) 2 g in dextrose 5 % 50 mL IVPB  Status:  Discontinued     2 g 100 mL/hr over 30 Minutes Intravenous Every 24 hours 09/04/16 1328 09/04/16 1535   09/04/16 1115  piperacillin-tazobactam (ZOSYN) IVPB 3.375 g  Status:  Discontinued     3.375 g 100 mL/hr over 30 Minutes Intravenous  Once 09/04/16 1114 09/04/16 1327       Assessment/Plan Gallstone pancreatitis - lipase trending down, 69 today from 148 - remains significantly tender in upper abdomen - recommend continued bowel rest, IV fluids - plan for laparoscopic cholecystectomy with IOC once she improves clinically, possibly over the weekend - repeat labs in AM  Hyperbilirubinemia - likely passed a CBD stone, MRCP negative for CBD stone/dilatation  Leukocytosis - resolved, WBC WNL today, recommend continuing IV Rocephin for possible cholecystitis  FEN: IVF, clear liquids, NPO after midnight ID: Rocephin 6/13>> VTE: SQ heparin, SCD's   LOS: 2 days    Edson Snowball , [facility] 09/06/2016, 8:29 AM Pager: [phone] Consults: [phone] Mon-Fri 7:00 am-4:30 pm Sat-Sun 7:00 am-11:30 am

## 2016-09-06 NOTE — Progress Notes (Signed)
PROGRESS NOTE    Bonnie Hinton  ZOX:[id number] DOB: 04-12-1940 DOA: 09/04/2016 PCP: Sherlene Shams, MD  Brief Narrative:Bonnie Hinton is a 76 y.o. female with medical history significant for hypertension, hyperlipidemia diabetes, hypothyroidism, cholelithiasis, GERD, chronic back pain presented to emergency department with worsening abdominal pain. Noted to have acute kidney injury, leukocytosis, hypokalemia, acute gallstone pancreatitis   Assessment & Plan:   Principal Problem:   Pancreatitis, gallstone -improving -suspect passed stone, LFTS/Bili/lipase much better -Gi concerned that Victoza could have contributed to pancreatitis  -MRCP without CBD stone/obstruction, cholelithiases noted -continue clears -CCS following, possible lap chole over the weekend  AKI -due to pancreatitis, ? ATN -improving, creatinine down to 1.6 from 2.5 -baseline normal -cut down IVF -no hydronephrosis noted on MRI    Diabetes mellitus type 2, controlled (HCC) -holding victoza, SSI now    Hypertension -Hold Norvasc for now -Monitor   Hypothyroidism.  -synthroid 100 mcg IV   Obesity/Hx fatty liver  DVT prophylaxis:Hep SQ Code Status: Full Code Family Communication:None at bedside, called and d/w daughter Talani Brazee Disposition Plan: Home pending improvement, lap chole  Consultants: GI , CCS  Subjective: abd pain better, some RUQ pain, no vomiting  Objective: Vitals:   09/06/16 0036 09/06/16 0500 09/06/16 0508 09/06/16 0938  BP:  (!) 172/68    Pulse:  93    Resp: 17 20 (!) 21 17  Temp:  98.7 F (37.1 C)    TempSrc:  Oral    SpO2: 96% 96% 94% 96%  Weight:  (!) 144.6 kg (318 lb 12.8 oz)    Height:        Intake/Output Summary (Last 24 hours) at 09/06/16 1046 Last data filed at 09/05/16 1701  Gross per 24 hour  Intake          1736.25 ml  Output                0 ml  Net          1736.25 ml   Filed Weights   09/04/16 1450 09/05/16 0500 09/06/16 0500    Weight: (!) 142.2 kg (313 lb 6.4 oz) (!) 143.5 kg (316 lb 4 oz) (!) 144.6 kg (318 lb 12.8 oz)    Examination:  Gen: Obese female, Awake, Alert, Oriented X 3,  HEENT: PERRLA, Neck supple, no JVD Lungs: decreased BS at bases CVS: RRR,No Gallops,Rubs or new Murmurs Abd: soft,obese, distended, tender in RUQ and epigastrium Extremities: No Cyanosis, Clubbing or edema Skin: no new rashes    Data Reviewed:   CBC:  Recent Labs Lab 09/04/16 0800 09/05/16 0446 09/06/16 0504  WBC 16.8* 14.5* 9.9  NEUTROABS 15.7*  --   --   HGB 14.4 12.1 11.9*  HCT 43.4 37.4 37.9  MCV 89.9 90.6 92.0  PLT 287 207 183   Basic Metabolic Panel:  Recent Labs Lab 09/04/16 0800 09/05/16 0446 09/06/16 0504  NA 133* 135 137  K 3.2* 4.1 4.4  CL 98* 102 103  CO2 15* 24 24  GLUCOSE 184* 166* 175*  BUN 26* 41* 33*  CREATININE 2.43* 2.50* 1.66*  CALCIUM 8.1* 7.5* 7.8*   GFR: Estimated Creatinine Clearance: 42.5 mL/min (A) (by C-G formula based on SCr of 1.66 mg/dL (H)). Liver Function Tests:  Recent Labs Lab 09/04/16 0800 09/05/16 0446 09/06/16 0504  AST 859* 251* 77*  ALT 867* 506* 298*  ALKPHOS 213* 154* 145*  BILITOT 4.7* 2.7* 1.2  PROT 6.8 6.3* 6.4*  ALBUMIN 3.3* 2.7*  2.5*    Recent Labs Lab 09/04/16 0800 09/05/16 0446 09/06/16 0504  LIPASE 723* 148* 69*   No results for input(s): AMMONIA in the last 168 hours. Coagulation Profile:  Recent Labs Lab 09/04/16 1442  INR 1.37   Cardiac Enzymes: No results for input(s): CKTOTAL, CKMB, CKMBINDEX, TROPONINI in the last 168 hours. BNP (last 3 results) No results for input(s): PROBNP in the last 8760 hours. HbA1C:  Recent Labs  09/04/16 1200  HGBA1C 6.8*   CBG:  Recent Labs Lab 09/05/16 1641 09/05/16 2034 09/06/16 0031 09/06/16 0429 09/06/16 0757  GLUCAP 104* 119* 118* 166* 149*   Lipid Profile:  Recent Labs  09/04/16 1201  CHOL 113  HDL 71  LDLCALC 19  TRIG 117  CHOLHDL 1.6   Thyroid Function  Tests:  Recent Labs  09/04/16 1442  TSH 1.461   Anemia Panel: No results for input(s): VITAMINB12, FOLATE, FERRITIN, TIBC, IRON, RETICCTPCT in the last 72 hours. Urine analysis:    Component Value Date/Time   COLORURINE AMBER (A) 09/04/2016 1616   APPEARANCEUR CLOUDY (A) 09/04/2016 1616   LABSPEC 1.020 09/04/2016 1616   PHURINE 5.0 09/04/2016 1616   GLUCOSEU 50 (A) 09/04/2016 1616   GLUCOSEU NEGATIVE 04/25/2015 1359   HGBUR MODERATE (A) 09/04/2016 1616   BILIRUBINUR SMALL (A) 09/04/2016 1616   BILIRUBINUR neg 04/25/2015 1450   KETONESUR NEGATIVE 09/04/2016 1616   PROTEINUR 100 (A) 09/04/2016 1616   UROBILINOGEN 0.2 04/25/2015 1450   UROBILINOGEN 0.2 04/25/2015 1359   NITRITE NEGATIVE 09/04/2016 1616   LEUKOCYTESUR TRACE (A) 09/04/2016 1616   Sepsis Labs: @LABRCNTIP (procalcitonin:4,lacticidven:4)  ) Recent Results (from the past 240 hour(s))  MRSA PCR Screening     Status: None   Collection Time: 09/04/16  4:53 PM  Result Value Ref Range Status   MRSA by PCR NEGATIVE NEGATIVE Final    Comment:        The GeneXpert MRSA Assay (FDA approved for NASAL specimens only), is one component of a comprehensive MRSA colonization surveillance program. It is not intended to diagnose MRSA infection nor to guide or monitor treatment for MRSA infections.          Radiology Studies: Dg Chest 2 View  Result Date: 09/04/2016 CLINICAL DATA:  Preop chest exam, bronchitis. EXAM: CHEST  2 VIEW COMPARISON:  None. FINDINGS: The heart size and mediastinal contours are within normal limits. Low lung volumes with mild increase in interstitial prominence suspicious for chronic bronchitic change. No pneumonic consolidation, effusion or pneumothorax. Mild degenerative change along the dorsal spine. IMPRESSION: Mild interstitial prominence bilaterally which may reflect chronic bronchitic change. No pulmonary consolidations to suggest pneumonia. Electronically Signed   By: Tollie Eth M.D.    On: 09/04/2016 13:51   Mr Abdomen Mrcp Wo Contrast  Result Date: 09/04/2016 CLINICAL DATA:  Right upper quadrant abdominal pain. Pancreatitis with elevated liver function studies. EXAM: MRI ABDOMEN WITHOUT CONTRAST  (INCLUDING MRCP) TECHNIQUE: Multiplanar multisequence MR imaging of the abdomen was performed. Heavily T2-weighted images of the biliary and pancreatic ducts were obtained, and three-dimensional MRCP images were rendered by post processing. COMPARISON:  Ultrasound today and 04/06/2016. FINDINGS: Despite efforts by the technologist and patient, moderate motion artifact is present on today's exam and could not be eliminated. This reduces exam sensitivity and specificity. Lower chest: The visualized lower chest appears remarkable. No significant pleural effusion. Hepatobiliary: The liver demonstrates loss of signal on the gradient echo opposed phase images consistent with steatosis. No focal lesions are identified. Multiple small  gallstones are present. There is no gallbladder wall thickening or surrounding inflammation. The ERCP images are degraded by motion. No significant biliary dilatation. No definite choledocholithiasis. Pancreas: Fatty replaced with mild surrounding edema. No focal fluid collection or ductal dilatation identified. Spleen: Normal in size without focal abnormality. Adrenals/Urinary Tract: Both adrenal glands appear normal. Tiny cyst in the lower pole of the left kidney. The right kidney appears normal. No hydronephrosis. Stomach/Bowel: No evidence of bowel wall thickening, distention or surrounding inflammatory change. Vascular/Lymphatic: Small lymph nodes in the porta hepatis, likely reactive. No enlarged retroperitoneal lymph nodes. No significant vascular findings on noncontrast imaging identified. Other: No ascites. Musculoskeletal: No acute or significant osseous findings. IMPRESSION: 1. Motion artifact results in suboptimal evaluation of the biliary system. Cholelithiasis  without evidence of cholecystitis, significant biliary dilatation or definite choledocholithiasis. 2. Hepatic steatosis. 3. Peripancreatic edema consistent with pancreatitis. No focal fluid collection. Electronically Signed   By: Carey BullocksWilliam  Veazey M.D.   On: 09/04/2016 13:53   Mr 3d Recon At Scanner  Result Date: 09/04/2016 CLINICAL DATA:  Right upper quadrant abdominal pain. Pancreatitis with elevated liver function studies. EXAM: MRI ABDOMEN WITHOUT CONTRAST  (INCLUDING MRCP) TECHNIQUE: Multiplanar multisequence MR imaging of the abdomen was performed. Heavily T2-weighted images of the biliary and pancreatic ducts were obtained, and three-dimensional MRCP images were rendered by post processing. COMPARISON:  Ultrasound today and 04/06/2016. FINDINGS: Despite efforts by the technologist and patient, moderate motion artifact is present on today's exam and could not be eliminated. This reduces exam sensitivity and specificity. Lower chest: The visualized lower chest appears remarkable. No significant pleural effusion. Hepatobiliary: The liver demonstrates loss of signal on the gradient echo opposed phase images consistent with steatosis. No focal lesions are identified. Multiple small gallstones are present. There is no gallbladder wall thickening or surrounding inflammation. The ERCP images are degraded by motion. No significant biliary dilatation. No definite choledocholithiasis. Pancreas: Fatty replaced with mild surrounding edema. No focal fluid collection or ductal dilatation identified. Spleen: Normal in size without focal abnormality. Adrenals/Urinary Tract: Both adrenal glands appear normal. Tiny cyst in the lower pole of the left kidney. The right kidney appears normal. No hydronephrosis. Stomach/Bowel: No evidence of bowel wall thickening, distention or surrounding inflammatory change. Vascular/Lymphatic: Small lymph nodes in the porta hepatis, likely reactive. No enlarged retroperitoneal lymph nodes. No  significant vascular findings on noncontrast imaging identified. Other: No ascites. Musculoskeletal: No acute or significant osseous findings. IMPRESSION: 1. Motion artifact results in suboptimal evaluation of the biliary system. Cholelithiasis without evidence of cholecystitis, significant biliary dilatation or definite choledocholithiasis. 2. Hepatic steatosis. 3. Peripancreatic edema consistent with pancreatitis. No focal fluid collection. Electronically Signed   By: Carey BullocksWilliam  Veazey M.D.   On: 09/04/2016 13:53        Scheduled Meds: . cycloSPORINE  1 drop Both Eyes BID  . heparin  5,000 Units Subcutaneous Q8H  . insulin aspart  0-9 Units Subcutaneous Q4H  . levothyroxine  100 mcg Intravenous Daily  . sodium chloride flush  3 mL Intravenous Q12H   Continuous Infusions: . cefTRIAXone (ROCEPHIN)  IV Stopped (09/05/16 1701)  . dextrose 5% lactated ringers 125 mL/hr at 09/06/16 0218  . sodium chloride       LOS: 2 days    Time spent: 25min    Zannie CovePreetha Ishita Mcnerney, MD [facility] Pager [phone]  If 7PM-7AM, please contact night-coverage [email or web address] Password [email or web address] 09/06/2016, 10:46 AM

## 2016-09-07 LAB — COMPREHENSIVE METABOLIC PANEL
ALT: 193 U/L — ABNORMAL HIGH (ref 14–54)
AST: 31 U/L (ref 15–41)
Albumin: 2.3 g/dL — ABNORMAL LOW (ref 3.5–5.0)
Alkaline Phosphatase: 145 U/L — ABNORMAL HIGH (ref 38–126)
Anion gap: 11 (ref 5–15)
BUN: 20 mg/dL (ref 6–20)
CO2: 24 mmol/L (ref 22–32)
Calcium: 8 mg/dL — ABNORMAL LOW (ref 8.9–10.3)
Chloride: 103 mmol/L (ref 101–111)
Creatinine, Ser: 1.26 mg/dL — ABNORMAL HIGH (ref 0.44–1.00)
GFR calc Af Amer: 47 mL/min — ABNORMAL LOW (ref >60)
GFR calc non Af Amer: 41 mL/min — ABNORMAL LOW (ref >60)
Glucose, Bld: 114 mg/dL — ABNORMAL HIGH (ref 65–99)
Potassium: 3.9 mmol/L (ref 3.5–5.1)
Sodium: 138 mmol/L (ref 135–145)
Total Bilirubin: 0.7 mg/dL (ref 0.3–1.2)
Total Protein: 6.4 g/dL — ABNORMAL LOW (ref 6.5–8.1)

## 2016-09-07 LAB — GLUCOSE, CAPILLARY
Glucose-Capillary: 109 mg/dL — ABNORMAL HIGH (ref 65–99)
Glucose-Capillary: 111 mg/dL — ABNORMAL HIGH (ref 65–99)
Glucose-Capillary: 113 mg/dL — ABNORMAL HIGH (ref 65–99)
Glucose-Capillary: 126 mg/dL — ABNORMAL HIGH (ref 65–99)
Glucose-Capillary: 170 mg/dL — ABNORMAL HIGH (ref 65–99)
Glucose-Capillary: 92 mg/dL (ref 65–99)

## 2016-09-07 LAB — CBC
HCT: 37.3 % (ref 36.0–46.0)
Hemoglobin: 11.8 g/dL — ABNORMAL LOW (ref 12.0–15.0)
MCH: 28.9 pg (ref 26.0–34.0)
MCHC: 31.6 g/dL (ref 30.0–36.0)
MCV: 91.2 fL (ref 78.0–100.0)
Platelets: 200 10*3/uL (ref 150–400)
RBC: 4.09 MIL/uL (ref 3.87–5.11)
RDW: 14.4 % (ref 11.5–15.5)
WBC: 10 10*3/uL (ref 4.0–10.5)

## 2016-09-07 LAB — LIPASE, BLOOD: Lipase: 56 U/L — ABNORMAL HIGH (ref 11–51)

## 2016-09-07 NOTE — Progress Notes (Signed)
PROGRESS NOTE    Bonnie Hinton  RUE:[id number] DOB: Sep 25, 1940 DOA: 09/04/2016 PCP: Sherlene Shams, MD  Brief Narrative:Bonnie Hinton is a 76 y.o. female with medical history significant for hypertension, hyperlipidemia diabetes, hypothyroidism, cholelithiasis, GERD, chronic back pain presented to emergency department with worsening abdominal pain. Noted to have acute kidney injury, leukocytosis, hypokalemia, acute gallstone pancreatitis   Assessment & Plan:   Principal Problem:   Pancreatitis, gallstone -continues to improve -suspect passed stone, LFTS/Bili/lipase much better -Gi concerned that Victoza could have contributed to pancreatitis  -MRCP without CBD stone/obstruction, cholelithiases noted -continue clears -CCS following, lap chole today or tomorrow  AKI -due to pancreatitis, ? ATN -improving, creatinine down to 1.3 from 2.5 -baseline normal -stop IVF -no hydronephrosis noted on MRI    Diabetes mellitus type 2, controlled (HCC) -holding victoza, SSI now    Hypertension -Hold Norvasc for now -Monitor   Hypothyroidism.  -synthroid 100 mcg IV   Obesity/Hx fatty liver  DVT prophylaxis:Hep SQ Code Status: Full Code Family Communication:None at bedside, called and d/w daughter Addylynn Balin 6/15 Disposition Plan: Home pending improvement, lap chole  Consultants: GI , CCS  Subjective: Feels better, waiting for surgery  Objective: Vitals:   09/06/16 1429 09/06/16 2054 09/07/16 0518 09/07/16 1405  BP: (!) 164/86 (!) 161/69 (!) 150/65 (!) 158/74  Pulse: (!) 104 85 79 82  Resp:  20 18 17   Temp:  99.5 F (37.5 C) 97.9 F (36.6 C) 99 F (37.2 C)  TempSrc:      SpO2: 100% 96% 99% 100%  Weight:      Height:        Intake/Output Summary (Last 24 hours) at 09/07/16 1411 Last data filed at 09/07/16 0630  Gross per 24 hour  Intake              340 ml  Output              300 ml  Net               40 ml   Filed Weights   09/04/16 1450  09/05/16 0500 09/06/16 0500  Weight: (!) 142.2 kg (313 lb 6.4 oz) (!) 143.5 kg (316 lb 4 oz) (!) 144.6 kg (318 lb 12.8 oz)    Examination: Gen: Awake, Alert, Oriented X 3, no distress HEENT: PERRLA, Neck supple, no JVD Lungs: decreased BS at bases CVS: RRR,No Gallops,Rubs or new Murmurs Abd: soft, tender in RUQ, BS present Extremities: No Cyanosis, Clubbing or edema Skin: no new rashes    Data Reviewed:   CBC:  Recent Labs Lab 09/04/16 0800 09/05/16 0446 09/06/16 0504 09/07/16 0630  WBC 16.8* 14.5* 9.9 10.0  NEUTROABS 15.7*  --   --   --   HGB 14.4 12.1 11.9* 11.8*  HCT 43.4 37.4 37.9 37.3  MCV 89.9 90.6 92.0 91.2  PLT 287 207 183 200   Basic Metabolic Panel:  Recent Labs Lab 09/04/16 0800 09/05/16 0446 09/06/16 0504 09/07/16 0630  NA 133* 135 137 138  K 3.2* 4.1 4.4 3.9  CL 98* 102 103 103  CO2 15* 24 24 24   GLUCOSE 184* 166* 175* 114*  BUN 26* 41* 33* 20  CREATININE 2.43* 2.50* 1.66* 1.26*  CALCIUM 8.1* 7.5* 7.8* 8.0*   GFR: Estimated Creatinine Clearance: 56 mL/min (A) (by C-G formula based on SCr of 1.26 mg/dL (H)). Liver Function Tests:  Recent Labs Lab 09/04/16 0800 09/05/16 0446 09/06/16 0504 09/07/16 0630  AST 859*  251* 77* 31  ALT 867* 506* 298* 193*  ALKPHOS 213* 154* 145* 145*  BILITOT 4.7* 2.7* 1.2 0.7  PROT 6.8 6.3* 6.4* 6.4*  ALBUMIN 3.3* 2.7* 2.5* 2.3*    Recent Labs Lab 09/04/16 0800 09/05/16 0446 09/06/16 0504 09/07/16 0630  LIPASE 723* 148* 69* 56*   No results for input(s): AMMONIA in the last 168 hours. Coagulation Profile:  Recent Labs Lab 09/04/16 1442  INR 1.37   Cardiac Enzymes: No results for input(s): CKTOTAL, CKMB, CKMBINDEX, TROPONINI in the last 168 hours. BNP (last 3 results) No results for input(s): PROBNP in the last 8760 hours. HbA1C: No results for input(s): HGBA1C in the last 72 hours. CBG:  Recent Labs Lab 09/06/16 2052 09/07/16 0031 09/07/16 0517 09/07/16 0808 09/07/16 1207  GLUCAP  137* 170* 111* 126* 109*   Lipid Profile: No results for input(s): CHOL, HDL, LDLCALC, TRIG, CHOLHDL, LDLDIRECT in the last 72 hours. Thyroid Function Tests:  Recent Labs  09/04/16 1442  TSH 1.461   Anemia Panel: No results for input(s): VITAMINB12, FOLATE, FERRITIN, TIBC, IRON, RETICCTPCT in the last 72 hours. Urine analysis:    Component Value Date/Time   COLORURINE AMBER (A) 09/04/2016 1616   APPEARANCEUR CLOUDY (A) 09/04/2016 1616   LABSPEC 1.020 09/04/2016 1616   PHURINE 5.0 09/04/2016 1616   GLUCOSEU 50 (A) 09/04/2016 1616   GLUCOSEU NEGATIVE 04/25/2015 1359   HGBUR MODERATE (A) 09/04/2016 1616   BILIRUBINUR SMALL (A) 09/04/2016 1616   BILIRUBINUR neg 04/25/2015 1450   KETONESUR NEGATIVE 09/04/2016 1616   PROTEINUR 100 (A) 09/04/2016 1616   UROBILINOGEN 0.2 04/25/2015 1450   UROBILINOGEN 0.2 04/25/2015 1359   NITRITE NEGATIVE 09/04/2016 1616   LEUKOCYTESUR TRACE (A) 09/04/2016 1616   Sepsis Labs: @LABRCNTIP (procalcitonin:4,lacticidven:4)  ) Recent Results (from the past 240 hour(s))  MRSA PCR Screening     Status: None   Collection Time: 09/04/16  4:53 PM  Result Value Ref Range Status   MRSA by PCR NEGATIVE NEGATIVE Final    Comment:        The GeneXpert MRSA Assay (FDA approved for NASAL specimens only), is one component of a comprehensive MRSA colonization surveillance program. It is not intended to diagnose MRSA infection nor to guide or monitor treatment for MRSA infections.          Radiology Studies: Dg Chest Port 1 View  Result Date: 09/06/2016 CLINICAL DATA:  Shortness of breath. EXAM: PORTABLE CHEST 1 VIEW COMPARISON:  09/04/2016 FINDINGS: The cardiomediastinal silhouette is unchanged. The lungs remain mildly hypoinflated with mild pulmonary vascular congestion and mild interstitial prominence. There is new patchy right basilar airspace opacity. No sizable pleural effusion or pneumothorax is identified. IMPRESSION: Hypoinflation with mild  pulmonary vascular congestion and new right basilar airspace opacity which may reflect developing pneumonia, asymmetric edema, or atelectasis. Electronically Signed   By: Sebastian Ache M.D.   On: 09/06/2016 15:03        Scheduled Meds: . cycloSPORINE  1 drop Both Eyes BID  . heparin  5,000 Units Subcutaneous Q8H  . insulin aspart  0-9 Units Subcutaneous Q4H  . levothyroxine  100 mcg Intravenous Daily  . sodium chloride flush  3 mL Intravenous Q12H   Continuous Infusions: . cefTRIAXone (ROCEPHIN)  IV Stopped (09/06/16 1600)  . dextrose 5% lactated ringers 10 mL/hr at 09/06/16 1435  . sodium chloride       LOS: 3 days    Time spent:    Zannie Cove, MD [facility] Pager  [phone]  If 7PM-7AM, please contact night-coverage [email or web address] Password [email or web address] 09/07/2016, 2:11 PM

## 2016-09-07 NOTE — Progress Notes (Signed)
   Subjective/Chief Complaint: Pt with con't abdominal pain   Objective: Vital signs in last 24 hours: Temp:  [97.9 F (36.6 C)-99.5 F (37.5 C)] 97.9 F (36.6 C) (06/16 0518) Pulse Rate:  [79-104] 79 (06/16 0518) Resp:  [18-20] 18 (06/16 0518) BP: (150-164)/(65-86) 150/65 (06/16 0518) SpO2:  [96 %-100 %] 99 % (06/16 0518) Last BM Date: 09/04/16  Intake/Output from previous day: 06/15 0701 - 06/16 0700 In: 560 [P.O.:440; I.V.:120] Out: 900 [Urine:900] Intake/Output this shift: No intake/output data recorded.  General appearance: alert and cooperative GI: soft, Pain epigastrum/RUQ  Lab Results:   Recent Labs  09/06/16 0504 09/07/16 0630  WBC 9.9 10.0  HGB 11.9* 11.8*  HCT 37.9 37.3  PLT 183 200   BMET  Recent Labs  09/06/16 0504 09/07/16 0630  NA 137 138  K 4.4 3.9  CL 103 103  CO2 24 24  GLUCOSE 175* 114*  BUN 33* 20  CREATININE 1.66* 1.26*  CALCIUM 7.8* 8.0*   PT/INR  Recent Labs  09/04/16 1442  LABPROT 17.0*  INR 1.37   ABG No results for input(s): PHART, HCO3 in the last 72 hours.  Invalid input(s): PCO2, PO2  Studies/Results: Dg Chest Port 1 View  Result Date: 09/06/2016 CLINICAL DATA:  Shortness of breath. EXAM: PORTABLE CHEST 1 VIEW COMPARISON:  09/04/2016 FINDINGS: The cardiomediastinal silhouette is unchanged. The lungs remain mildly hypoinflated with mild pulmonary vascular congestion and mild interstitial prominence. There is new patchy right basilar airspace opacity. No sizable pleural effusion or pneumothorax is identified. IMPRESSION: Hypoinflation with mild pulmonary vascular congestion and new right basilar airspace opacity which may reflect developing pneumonia, asymmetric edema, or atelectasis. Electronically Signed   By: Sebastian AcheAllen  Grady M.D.   On: 09/06/2016 15:03    Anti-infectives: Anti-infectives    Start     Dose/Rate Route Frequency Ordered Stop   09/05/16 1630  cefTRIAXone (ROCEPHIN) 2 g in dextrose 5 % 50 mL IVPB     Comments:  Pharmacy may adjust dosing strength / duration / interval for maximal efficacy   2 g 100 mL/hr over 30 Minutes Intravenous Every 24 hours 09/05/16 1520     09/04/16 1330  cefTRIAXone (ROCEPHIN) 2 g in dextrose 5 % 50 mL IVPB  Status:  Discontinued     2 g 100 mL/hr over 30 Minutes Intravenous Every 24 hours 09/04/16 1328 09/04/16 1535   09/04/16 1115  piperacillin-tazobactam (ZOSYN) IVPB 3.375 g  Status:  Discontinued     3.375 g 100 mL/hr over 30 Minutes Intravenous  Once 09/04/16 1114 09/04/16 1327      Assessment/Plan: Gallstone pancreatitis - improving - still significantly tender - recommend continued bowel rest, IV fluids, and PCA for pain - plan for laparoscopic cholecystectomy with IOC possibly Sun/Mon  Hyperbilirubinemia - likely passed a CBD stone, MRCP negative for CBD stone/dilatation  Leukocytosis - resolved, con't abx    LOS: 3 days    Marigene Ehlersamirez Jr., [facility] 09/07/2016

## 2016-09-07 NOTE — Plan of Care (Signed)
Problem: Education: Goal: Knowledge of [facility] Education information/materials will improve Outcome: Progressing POC reviewed with pt.; pt. NPO after mn for ? procedure today.

## 2016-09-08 ENCOUNTER — Inpatient Hospital Stay (HOSPITAL_COMMUNITY): Payer: Medicare Other

## 2016-09-08 LAB — COMPREHENSIVE METABOLIC PANEL
ALT: 127 U/L — ABNORMAL HIGH (ref 14–54)
AST: 23 U/L (ref 15–41)
Albumin: 2.4 g/dL — ABNORMAL LOW (ref 3.5–5.0)
Alkaline Phosphatase: 125 U/L (ref 38–126)
Anion gap: 11 (ref 5–15)
BUN: 17 mg/dL (ref 6–20)
CO2: 24 mmol/L (ref 22–32)
Calcium: 8.6 mg/dL — ABNORMAL LOW (ref 8.9–10.3)
Chloride: 104 mmol/L (ref 101–111)
Creatinine, Ser: 1.16 mg/dL — ABNORMAL HIGH (ref 0.44–1.00)
GFR calc Af Amer: 52 mL/min — ABNORMAL LOW (ref >60)
GFR calc non Af Amer: 45 mL/min — ABNORMAL LOW (ref >60)
Glucose, Bld: 104 mg/dL — ABNORMAL HIGH (ref 65–99)
Potassium: 4 mmol/L (ref 3.5–5.1)
Sodium: 139 mmol/L (ref 135–145)
Total Bilirubin: 0.7 mg/dL (ref 0.3–1.2)
Total Protein: 6.7 g/dL (ref 6.5–8.1)

## 2016-09-08 LAB — CBC
HCT: 40.4 % (ref 36.0–46.0)
Hemoglobin: 12.9 g/dL (ref 12.0–15.0)
MCH: 29.1 pg (ref 26.0–34.0)
MCHC: 31.9 g/dL (ref 30.0–36.0)
MCV: 91.2 fL (ref 78.0–100.0)
Platelets: 206 10*3/uL (ref 150–400)
RBC: 4.43 MIL/uL (ref 3.87–5.11)
RDW: 14.2 % (ref 11.5–15.5)
WBC: 9.3 10*3/uL (ref 4.0–10.5)

## 2016-09-08 LAB — GLUCOSE, CAPILLARY
Glucose-Capillary: 104 mg/dL — ABNORMAL HIGH (ref 65–99)
Glucose-Capillary: 108 mg/dL — ABNORMAL HIGH (ref 65–99)
Glucose-Capillary: 113 mg/dL — ABNORMAL HIGH (ref 65–99)
Glucose-Capillary: 121 mg/dL — ABNORMAL HIGH (ref 65–99)
Glucose-Capillary: 143 mg/dL — ABNORMAL HIGH (ref 65–99)
Glucose-Capillary: 153 mg/dL — ABNORMAL HIGH (ref 65–99)

## 2016-09-08 LAB — SURGICAL PCR SCREEN
MRSA, PCR: NEGATIVE
Staphylococcus aureus: NEGATIVE

## 2016-09-08 MED ORDER — TECHNETIUM TC 99M MEBROFENIN IV KIT
5.2500 | PACK | Freq: Once | INTRAVENOUS | Status: AC | PRN
  Administered 2016-09-08: 5.25 via INTRAVENOUS

## 2016-09-08 MED ORDER — CHLORHEXIDINE GLUCONATE CLOTH 2 % EX PADS
6.0000 | MEDICATED_PAD | Freq: Once | CUTANEOUS | Status: AC
  Administered 2016-09-08: 6 via TOPICAL

## 2016-09-08 MED ORDER — MORPHINE SULFATE (PF) 4 MG/ML IV SOLN
3.0000 mg | Freq: Once | INTRAVENOUS | Status: AC
  Administered 2016-09-08: 3 mg via INTRAVENOUS

## 2016-09-08 MED ORDER — MORPHINE SULFATE (PF) 4 MG/ML IV SOLN
INTRAVENOUS | Status: AC
Start: 2016-09-08 — End: 2016-09-08
  Administered 2016-09-08: 3 mg via INTRAVENOUS
  Filled 2016-09-08: qty 1

## 2016-09-08 MED ORDER — CHLORHEXIDINE GLUCONATE CLOTH 2 % EX PADS
6.0000 | MEDICATED_PAD | Freq: Once | CUTANEOUS | Status: DC
Start: 2016-09-09 — End: 2016-09-12

## 2016-09-08 MED ORDER — PIPERACILLIN-TAZOBACTAM 3.375 G IVPB
3.3750 g | Freq: Three times a day (TID) | INTRAVENOUS | Status: DC
  Administered 2016-09-08 – 2016-09-14 (×17): 3.375 g via INTRAVENOUS
  Filled 2016-09-08 (×20): qty 50

## 2016-09-08 NOTE — Progress Notes (Signed)
Per Dr. Manson PasseyBrown verbal order, patient will receive Morphine 3mg  IV. Will continue to monitor.

## 2016-09-08 NOTE — Progress Notes (Addendum)
Pharmacy Antibiotic Note  Bonnie Hinton is a 76 y.o. female admitted on 09/04/2016 with gallstone pancreatitis, possible cholecystitis.  Pharmacy has been consulted for Zosyn dosing.  Plan: Zosyn 3.375g IV q8h (4 hour infusion).  Follow renal function, surgical plans, any cultures  Height: 5\' 5"  (165.1 cm) Weight: (!) 318 lb 12.8 oz (144.6 kg) IBW/kg (Calculated) : 57  Temp (24hrs), Avg:99.1 F (37.3 C), Min:98.9 F (37.2 C), Max:99.3 F (37.4 C)   Recent Labs Lab 09/04/16 0800 09/05/16 0446 09/06/16 0504 09/07/16 0630 09/08/16 0738  WBC 16.8* 14.5* 9.9 10.0 9.3  CREATININE 2.43* 2.50* 1.66* 1.26* 1.16*    Estimated Creatinine Clearance: 60.9 mL/min (A) (by C-G formula based on SCr of 1.16 mg/dL (H)).    Allergies  Allergen Reactions  . Erythromycin Rash  . Iodinated Diagnostic Agents Swelling and Rash  . Fentanyl Other (See Comments)    Duragesic-25  . Latex Itching and Rash  . Lisinopril Other (See Comments)    Hyperkalemia   . Other Rash    Paper tape, possibly adhesive tape    Antimicrobials this admission: Ceftriaxone 6/14 >> 6/16 Zosyn 6/17 >>   Dose adjustments this admission: N/a  Microbiology results: 6/13 MRSA PCR: neg  Thank you for allowing pharmacy to be a part of this patient's care.  Americus Scheurich D. Dominque Marlin, PharmD, BCPS Clinical Pharmacist Pager: [phone] Clinical Phone for 09/08/2016 until 3:30pm: [phone] If after 3:30pm, please call main pharmacy at [phone] 09/08/2016 11:05 AM

## 2016-09-08 NOTE — Progress Notes (Signed)
[facility] Progress Note     Subjective: CC: gallstone pancreatitis possible acute cholecystitis  Pt off floor receiving HIDA scan.  Objective: Vital signs in last 24 hours: Temp:  [98.9 F (37.2 C)-99.3 F (37.4 C)] 98.9 F (37.2 C) (06/17 0503) Pulse Rate:  [81-85] 81 (06/17 0503) Resp:  [17-18] 18 (06/17 0503) BP: (154-161)/(69-74) 161/73 (06/17 0503) SpO2:  [100 %] 100 % (06/17 0503) Last BM Date: 09/04/16  Intake/Output from previous day: 06/16 0701 - 06/17 0700 In: 248.5 [I.V.:198.5; IV Piggyback:50] Out: 1000 [Urine:1000] Intake/Output this shift: No intake/output data recorded.  PE: Unable to perform.  Lab Results:   Recent Labs  09/06/16 0504 09/07/16 0630  WBC 9.9 10.0  HGB 11.9* 11.8*  HCT 37.9 37.3  PLT 183 200   BMET  Recent Labs  09/06/16 0504 09/07/16 0630  NA 137 138  K 4.4 3.9  CL 103 103  CO2 24 24  GLUCOSE 175* 114*  BUN 33* 20  CREATININE 1.66* 1.26*  CALCIUM 7.8* 8.0*   PT/INR No results for input(s): LABPROT, INR in the last 72 hours. CMP     Component Value Date/Time   NA 138 09/07/2016 0630   NA 139 01/24/2016   K 3.9 09/07/2016 0630   CL 103 09/07/2016 0630   CO2 24 09/07/2016 0630   GLUCOSE 114 (H) 09/07/2016 0630   BUN 20 09/07/2016 0630   BUN 24 (A) 01/24/2016   CREATININE 1.26 (H) 09/07/2016 0630   CALCIUM 8.0 (L) 09/07/2016 0630   PROT 6.4 (L) 09/07/2016 0630   ALBUMIN 2.3 (L) 09/07/2016 0630   AST 31 09/07/2016 0630   ALT 193 (H) 09/07/2016 0630   ALKPHOS 145 (H) 09/07/2016 0630   BILITOT 0.7 09/07/2016 0630   GFRNONAA 41 (L) 09/07/2016 0630   GFRAA 47 (L) 09/07/2016 0630   Lipase     Component Value Date/Time   LIPASE 56 (H) 09/07/2016 0630       Studies/Results: Dg Chest Port 1 View  Result Date: 09/06/2016 CLINICAL DATA:  Shortness of breath. EXAM: PORTABLE CHEST 1 VIEW COMPARISON:  09/04/2016 FINDINGS: The cardiomediastinal silhouette is unchanged. The lungs remain mildly  hypoinflated with mild pulmonary vascular congestion and mild interstitial prominence. There is new patchy right basilar airspace opacity. No sizable pleural effusion or pneumothorax is identified. IMPRESSION: Hypoinflation with mild pulmonary vascular congestion and new right basilar airspace opacity which may reflect developing pneumonia, asymmetric edema, or atelectasis. Electronically Signed   By: Sebastian Ache M.D.   On: 09/06/2016 15:03    Anti-infectives: Anti-infectives    Start     Dose/Rate Route Frequency Ordered Stop   09/05/16 1630  cefTRIAXone (ROCEPHIN) 2 g in dextrose 5 % 50 mL IVPB    Comments:  Pharmacy may adjust dosing strength / duration / interval for maximal efficacy   2 g 100 mL/hr over 30 Minutes Intravenous Every 24 hours 09/05/16 1520     09/04/16 1330  cefTRIAXone (ROCEPHIN) 2 g in dextrose 5 % 50 mL IVPB  Status:  Discontinued     2 g 100 mL/hr over 30 Minutes Intravenous Every 24 hours 09/04/16 1328 09/04/16 1535   09/04/16 1115  piperacillin-tazobactam (ZOSYN) IVPB 3.375 g  Status:  Discontinued     3.375 g 100 mL/hr over 30 Minutes Intravenous  Once 09/04/16 1114 09/04/16 1327     Assessment/Plan Gallstone pancreatitis - improving, lipase trending down. - still significantly tender despite improving labs >> HIDA today to r/o cholecystitis prior  to proceeding with surgery. - recommend continued bowel rest, IV fluids, and PCA for pain - plan for laparoscopic cholecystectomy with IOC possibly tomorrow 6/18  Hyperbilirubinemia- likely passed a CBD stone, MRCP negative for CBD stone/dilatation  Leukocytosis- resolved, con't abx FEN - NPO, IVF ID - Rocephin VTE - SQ heparin, SCD's    LOS: 4 days    Adam PhenixElizabeth S Simaan , [facility] 09/08/2016, 8:29 AM Pager: [phone] Consults: [phone] Mon-Fri 7:00 am-4:30 pm Sat-Sun 7:00 am-11:30 am

## 2016-09-08 NOTE — Progress Notes (Signed)
PROGRESS NOTE    Bonnie Hinton  WUJ:[id number] DOB: 1941-02-15 DOA: 09/04/2016 PCP: Sherlene Shams, MD  Brief Narrative:Bonnie Hinton is a 76 y.o. female with medical history significant for hypertension, hyperlipidemia diabetes, hypothyroidism, cholelithiasis, GERD, chronic back pain presented to emergency department with worsening abdominal pain. Noted to have acute kidney injury, leukocytosis, hypokalemia, acute gallstone pancreatitis   Assessment & Plan:   Pancreatitis, gallstone/Cholecystitis -continues to improve -suspect passed stone, LFTS/Bili/lipase much better -Gi concerned that Victoza could have contributed to pancreatitis  -MRCP without CBD stone/obstruction, cholelithiases noted -HIDA scan yesterday with cholecystitis, add Zosyn -CCS following, plan for Lap Chole-possibly tomorrow  AKI -due to pancreatitis, ? ATN -improving, creatinine down to 1.3 from 2.5 -baseline normal -stop IVF, improved -no hydronephrosis noted on MRI   Diabetes mellitus type 2, controlled (HCC) -holding victoza, SSI now   Hypertension -Hold Norvasc for now -Monitor   Hypothyroidism.  -synthroid 100 mcg IV   Obesity/Hx fatty liver  DVT prophylaxis:Hep SQ Code Status: Full Code Family Communication:None at bedside, called and d/w daughter Bonnie Hinton 6/15 Disposition Plan: Home pending improvement, lap chole  Consultants: GI , CCS  Subjective: Feels better, waiting for surgery  Objective: Vitals:   09/07/16 0518 09/07/16 1405 09/07/16 2201 09/08/16 0503  BP: (!) 150/65 (!) 158/74 (!) 154/69 (!) 161/73  Pulse: 79 82 85 81  Resp: 18 17 18 18   Temp: 97.9 F (36.6 C) 99 F (37.2 C) 99.3 F (37.4 C) 98.9 F (37.2 C)  TempSrc:      SpO2: 99% 100% 100% 100%  Weight:      Height:        Intake/Output Summary (Last 24 hours) at 09/08/16 1328 Last data filed at 09/08/16 0700  Gross per 24 hour  Intake            248.5 ml  Output             1000 ml  Net            -751.5 ml   Filed Weights   09/04/16 1450 09/05/16 0500 09/06/16 0500  Weight: (!) 142.2 kg (313 lb 6.4 oz) (!) 143.5 kg (316 lb 4 oz) (!) 144.6 kg (318 lb 12.8 oz)    Examination: Gen: Awake, Alert, Oriented X 3,  HEENT: PERRLA, Neck supple, no JVD Lungs: decreased BS at bases CVS: RRR,No Gallops,Rubs or new Murmurs Abd: soft, tender in RUQ, non distended, BS present Extremities: No Cyanosis, Clubbing or edema Skin: no new rashes   Data Reviewed:   CBC:  Recent Labs Lab 09/04/16 0800 09/05/16 0446 09/06/16 0504 09/07/16 0630 09/08/16 0738  WBC 16.8* 14.5* 9.9 10.0 9.3  NEUTROABS 15.7*  --   --   --   --   HGB 14.4 12.1 11.9* 11.8* 12.9  HCT 43.4 37.4 37.9 37.3 40.4  MCV 89.9 90.6 92.0 91.2 91.2  PLT 287 207 183 200 206   Basic Metabolic Panel:  Recent Labs Lab 09/04/16 0800 09/05/16 0446 09/06/16 0504 09/07/16 0630 09/08/16 0738  NA 133* 135 137 138 139  K 3.2* 4.1 4.4 3.9 4.0  CL 98* 102 103 103 104  CO2 15* 24 24 24 24   GLUCOSE 184* 166* 175* 114* 104*  BUN 26* 41* 33* 20 17  CREATININE 2.43* 2.50* 1.66* 1.26* 1.16*  CALCIUM 8.1* 7.5* 7.8* 8.0* 8.6*   GFR: Estimated Creatinine Clearance: 60.9 mL/min (A) (by C-G formula based on SCr of 1.16 mg/dL (H)). Liver Function Tests:  Recent Labs Lab 09/04/16 0800 09/05/16 0446 09/06/16 0504 09/07/16 0630 09/08/16 0738  AST 859* 251* 77* 31 23  ALT 867* 506* 298* 193* 127*  ALKPHOS 213* 154* 145* 145* 125  BILITOT 4.7* 2.7* 1.2 0.7 0.7  PROT 6.8 6.3* 6.4* 6.4* 6.7  ALBUMIN 3.3* 2.7* 2.5* 2.3* 2.4*    Recent Labs Lab 09/04/16 0800 09/05/16 0446 09/06/16 0504 09/07/16 0630  LIPASE 723* 148* 69* 56*   No results for input(s): AMMONIA in the last 168 hours. Coagulation Profile:  Recent Labs Lab 09/04/16 1442  INR 1.37   Cardiac Enzymes: No results for input(s): CKTOTAL, CKMB, CKMBINDEX, TROPONINI in the last 168 hours. BNP (last 3 results) No results for input(s): PROBNP in the  last 8760 hours. HbA1C: No results for input(s): HGBA1C in the last 72 hours. CBG:  Recent Labs Lab 09/07/16 1954 09/08/16 0038 09/08/16 0343 09/08/16 0734 09/08/16 1311  GLUCAP 113* 108* 113* 104* 121*   Lipid Profile: No results for input(s): CHOL, HDL, LDLCALC, TRIG, CHOLHDL, LDLDIRECT in the last 72 hours. Thyroid Function Tests: No results for input(s): TSH, T4TOTAL, FREET4, T3FREE, THYROIDAB in the last 72 hours. Anemia Panel: No results for input(s): VITAMINB12, FOLATE, FERRITIN, TIBC, IRON, RETICCTPCT in the last 72 hours. Urine analysis:    Component Value Date/Time   COLORURINE AMBER (A) 09/04/2016 1616   APPEARANCEUR CLOUDY (A) 09/04/2016 1616   LABSPEC 1.020 09/04/2016 1616   PHURINE 5.0 09/04/2016 1616   GLUCOSEU 50 (A) 09/04/2016 1616   GLUCOSEU NEGATIVE 04/25/2015 1359   HGBUR MODERATE (A) 09/04/2016 1616   BILIRUBINUR SMALL (A) 09/04/2016 1616   BILIRUBINUR neg 04/25/2015 1450   KETONESUR NEGATIVE 09/04/2016 1616   PROTEINUR 100 (A) 09/04/2016 1616   UROBILINOGEN 0.2 04/25/2015 1450   UROBILINOGEN 0.2 04/25/2015 1359   NITRITE NEGATIVE 09/04/2016 1616   LEUKOCYTESUR TRACE (A) 09/04/2016 1616   Sepsis Labs: @LABRCNTIP (procalcitonin:4,lacticidven:4)  ) Recent Results (from the past 240 hour(s))  MRSA PCR Screening     Status: None   Collection Time: 09/04/16  4:53 PM  Result Value Ref Range Status   MRSA by PCR NEGATIVE NEGATIVE Final    Comment:        The GeneXpert MRSA Assay (FDA approved for NASAL specimens only), is one component of a comprehensive MRSA colonization surveillance program. It is not intended to diagnose MRSA infection nor to guide or monitor treatment for MRSA infections.          Radiology Studies: Nm Hepatobiliary Liver Func  Result Date: 09/08/2016 CLINICAL DATA:  Abdominal pain. Right upper quadrant abdominal pain the last few days. Acute kidney injury, leukocytosis, hypoproteinemia, acute gallstone  pancreatitis. EXAM: NUCLEAR MEDICINE HEPATOBILIARY IMAGING TECHNIQUE: Sequential images of the abdomen were obtained out to 60 minutes following intravenous administration of radiopharmaceutical. RADIOPHARMACEUTICALS:  5.25 MCi Tc-48m  Choletec IV COMPARISON:  MRI 09/04/2016 FINDINGS: There is prompt uptake by the liver. Early activity is identified within the common bile duct and bowel. Initial imaging fails to demonstrate the gallbladder. 3 mg of morphine sulfate was given intravenously. Subsequent images are performed to 30 minutes, again demonstrating no evidence for gallbladder filling. IMPRESSION: Findings are consistent with acute cholecystitis. Electronically Signed   By: Norva Pavlov M.D.   On: 09/08/2016 10:44   Dg Chest Port 1 View  Result Date: 09/06/2016 CLINICAL DATA:  Shortness of breath. EXAM: PORTABLE CHEST 1 VIEW COMPARISON:  09/04/2016 FINDINGS: The cardiomediastinal silhouette is unchanged. The lungs remain mildly hypoinflated with mild pulmonary vascular  congestion and mild interstitial prominence. There is new patchy right basilar airspace opacity. No sizable pleural effusion or pneumothorax is identified. IMPRESSION: Hypoinflation with mild pulmonary vascular congestion and new right basilar airspace opacity which may reflect developing pneumonia, asymmetric edema, or atelectasis. Electronically Signed   By: Sebastian AcheAllen  Grady M.D.   On: 09/06/2016 15:03        Scheduled Meds: . cycloSPORINE  1 drop Both Eyes BID  . heparin  5,000 Units Subcutaneous Q8H  . insulin aspart  0-9 Units Subcutaneous Q4H  . levothyroxine  100 mcg Intravenous Daily  . sodium chloride flush  3 mL Intravenous Q12H   Continuous Infusions: . dextrose 5% lactated ringers 1,000 mL (09/07/16 1723)  . piperacillin-tazobactam (ZOSYN)  IV 3.375 g (09/08/16 1235)  . sodium chloride       LOS: 4 days    Time spent: 25min    Zannie CovePreetha Trent Gabler, MD [facility] Pager [phone]  If 7PM-7AM,  please contact night-coverage [email or web address] Password [email or web address] 09/08/2016, 1:28 PM

## 2016-09-08 NOTE — Progress Notes (Signed)
Patient back from IR. Alert and oriented, no complaints of pain or other distress. Will continue to monitor.

## 2016-09-09 ENCOUNTER — Inpatient Hospital Stay (HOSPITAL_COMMUNITY): Payer: Medicare Other | Admitting: Anesthesiology

## 2016-09-09 ENCOUNTER — Encounter (HOSPITAL_COMMUNITY)
Admission: EM | Disposition: A | Discharge status: Skilled Nursing Facility | Payer: Self-pay | Source: Home / Self Care | Attending: Internal Medicine

## 2016-09-09 DIAGNOSIS — R109 Unspecified abdominal pain: Secondary | ICD-10-CM

## 2016-09-09 HISTORY — PX: CHOLECYSTECTOMY: SHX55

## 2016-09-09 LAB — GLUCOSE, CAPILLARY
Glucose-Capillary: 127 mg/dL — ABNORMAL HIGH (ref 65–99)
Glucose-Capillary: 131 mg/dL — ABNORMAL HIGH (ref 65–99)
Glucose-Capillary: 140 mg/dL — ABNORMAL HIGH (ref 65–99)
Glucose-Capillary: 145 mg/dL — ABNORMAL HIGH (ref 65–99)
Glucose-Capillary: 150 mg/dL — ABNORMAL HIGH (ref 65–99)
Glucose-Capillary: 152 mg/dL — ABNORMAL HIGH (ref 65–99)

## 2016-09-09 SURGERY — LAPAROSCOPIC CHOLECYSTECTOMY WITH INTRAOPERATIVE CHOLANGIOGRAM
Anesthesia: General

## 2016-09-09 MED ORDER — IOPAMIDOL (ISOVUE-300) INJECTION 61%
INTRAVENOUS | Status: AC
  Filled 2016-09-09: qty 50

## 2016-09-09 MED ORDER — HYDROMORPHONE HCL 1 MG/ML IJ SOLN
1.0000 mg | INTRAMUSCULAR | Status: DC | PRN
  Administered 2016-09-09 – 2016-09-12 (×16): 1 mg via INTRAVENOUS
  Filled 2016-09-09 (×19): qty 1

## 2016-09-09 MED ORDER — CEFAZOLIN SODIUM-DEXTROSE 2-4 GM/100ML-% IV SOLN
INTRAVENOUS | Status: AC
  Filled 2016-09-09: qty 100

## 2016-09-09 MED ORDER — OXYCODONE HCL 5 MG PO TABS
5.0000 mg | ORAL_TABLET | ORAL | Status: DC | PRN
  Administered 2016-09-12 – 2016-09-13 (×4): 5 mg via ORAL
  Filled 2016-09-09 (×4): qty 1

## 2016-09-09 MED ORDER — PROMETHAZINE HCL 25 MG/ML IJ SOLN: 6.2500 mg | INTRAMUSCULAR | Status: DC | PRN

## 2016-09-09 MED ORDER — MORPHINE SULFATE (PF) 2 MG/ML IV SOLN
2.0000 mg | INTRAVENOUS | Status: DC | PRN
  Administered 2016-09-10: 2 mg via INTRAVENOUS
  Filled 2016-09-09: qty 1

## 2016-09-09 MED ORDER — ROCURONIUM BROMIDE 100 MG/10ML IV SOLN
INTRAVENOUS | Status: DC | PRN
  Administered 2016-09-09: 50 mg via INTRAVENOUS

## 2016-09-09 MED ORDER — HYDROMORPHONE HCL 1 MG/ML IJ SOLN: 0.2500 mg | INTRAMUSCULAR | Status: DC | PRN

## 2016-09-09 MED ORDER — ESMOLOL HCL 100 MG/10ML IV SOLN
INTRAVENOUS | Status: DC | PRN
  Administered 2016-09-09: 20 mg via INTRAVENOUS
  Administered 2016-09-09: 30 mg via INTRAVENOUS
  Administered 2016-09-09: 20 mg via INTRAVENOUS

## 2016-09-09 MED ORDER — SUGAMMADEX SODIUM 200 MG/2ML IV SOLN
INTRAVENOUS | Status: DC | PRN
  Administered 2016-09-09: 200 mg via INTRAVENOUS

## 2016-09-09 MED ORDER — PROPOFOL 10 MG/ML IV BOLUS
INTRAVENOUS | Status: DC | PRN
  Administered 2016-09-09: 150 mg via INTRAVENOUS
  Administered 2016-09-09: 50 mg via INTRAVENOUS

## 2016-09-09 MED ORDER — BUPIVACAINE-EPINEPHRINE 0.25% -1:200000 IJ SOLN
INTRAMUSCULAR | Status: DC | PRN
  Administered 2016-09-09: 12 mL

## 2016-09-09 MED ORDER — SUCCINYLCHOLINE CHLORIDE 20 MG/ML IJ SOLN
INTRAMUSCULAR | Status: DC | PRN
  Administered 2016-09-09: 120 mg via INTRAVENOUS

## 2016-09-09 MED ORDER — 0.9 % SODIUM CHLORIDE (POUR BTL) OPTIME
TOPICAL | Status: DC | PRN
  Administered 2016-09-09: 1000 mL

## 2016-09-09 MED ORDER — FENTANYL CITRATE (PF) 250 MCG/5ML IJ SOLN
INTRAMUSCULAR | Status: AC
  Filled 2016-09-09: qty 5

## 2016-09-09 MED ORDER — CEFAZOLIN SODIUM-DEXTROSE 1-4 GM/50ML-% IV SOLN
INTRAVENOUS | Status: AC
  Filled 2016-09-09: qty 50

## 2016-09-09 MED ORDER — ONDANSETRON HCL 4 MG/2ML IJ SOLN
INTRAMUSCULAR | Status: DC | PRN
  Administered 2016-09-09: 4 mg via INTRAVENOUS

## 2016-09-09 MED ORDER — DEXTROSE 5 % IV SOLN
INTRAVENOUS | Status: DC | PRN
  Administered 2016-09-09: 3 g via INTRAVENOUS

## 2016-09-09 MED ORDER — FENTANYL CITRATE (PF) 250 MCG/5ML IJ SOLN
INTRAMUSCULAR | Status: DC | PRN
  Administered 2016-09-09 (×2): 50 ug via INTRAVENOUS
  Administered 2016-09-09: 150 ug via INTRAVENOUS
  Administered 2016-09-09: 100 ug via INTRAVENOUS
  Administered 2016-09-09 (×3): 50 ug via INTRAVENOUS

## 2016-09-09 MED ORDER — BUPIVACAINE-EPINEPHRINE (PF) 0.25% -1:200000 IJ SOLN
INTRAMUSCULAR | Status: AC
  Filled 2016-09-09: qty 30

## 2016-09-09 MED ORDER — SODIUM CHLORIDE 0.9 % IR SOLN
Status: DC | PRN
  Administered 2016-09-09: 1000 mL

## 2016-09-09 MED ORDER — LIDOCAINE HCL (CARDIAC) 20 MG/ML IV SOLN
INTRAVENOUS | Status: DC | PRN
  Administered 2016-09-09: 100 mg via INTRATRACHEAL

## 2016-09-09 MED ORDER — PROPOFOL 10 MG/ML IV BOLUS
INTRAVENOUS | Status: AC
  Filled 2016-09-09: qty 20

## 2016-09-09 MED ORDER — LACTATED RINGERS IV SOLN
INTRAVENOUS | Status: DC | PRN
  Administered 2016-09-09 (×2): via INTRAVENOUS

## 2016-09-09 SURGICAL SUPPLY — 44 items
APPLIER CLIP ROT 10 11.4 M/L (STAPLE) ×2
BENZOIN TINCTURE PRP APPL 2/3 (GAUZE/BANDAGES/DRESSINGS) ×2 IMPLANT
BLADE CLIPPER SURG (BLADE) IMPLANT
CANISTER SUCT 3000ML PPV (MISCELLANEOUS) ×2 IMPLANT
CHLORAPREP W/TINT 26ML (MISCELLANEOUS) ×2 IMPLANT
CLIP APPLIE ROT 10 11.4 M/L (STAPLE) ×1 IMPLANT
COVER MAYO STAND STRL (DRAPES) ×2 IMPLANT
COVER SURGICAL LIGHT HANDLE (MISCELLANEOUS) ×2 IMPLANT
DRAPE C-ARM 42X72 X-RAY (DRAPES) ×2 IMPLANT
DRSG TEGADERM 2-3/8X2-3/4 SM (GAUZE/BANDAGES/DRESSINGS) ×6 IMPLANT
DRSG TEGADERM 4X4.75 (GAUZE/BANDAGES/DRESSINGS) ×2 IMPLANT
ELECT REM PT RETURN 9FT ADLT (ELECTROSURGICAL) ×2
ELECTRODE REM PT RTRN 9FT ADLT (ELECTROSURGICAL) ×1 IMPLANT
FILTER SMOKE EVAC LAPAROSHD (FILTER) ×2 IMPLANT
GAUZE SPONGE 2X2 8PLY STRL LF (GAUZE/BANDAGES/DRESSINGS) ×1 IMPLANT
GLOVE BIO SURGEON STRL SZ7 (GLOVE) ×2 IMPLANT
GLOVE BIOGEL PI IND STRL 7.5 (GLOVE) ×2 IMPLANT
GLOVE BIOGEL PI INDICATOR 7.5 (GLOVE) ×2
GLOVE SURG SS PI 7.5 STRL IVOR (GLOVE) ×2 IMPLANT
GOWN STRL REUS W/ TWL LRG LVL3 (GOWN DISPOSABLE) ×3 IMPLANT
GOWN STRL REUS W/TWL LRG LVL3 (GOWN DISPOSABLE) ×3
HEMOSTAT SNOW SURGICEL 2X4 (HEMOSTASIS) ×2 IMPLANT
KIT BASIN OR (CUSTOM PROCEDURE TRAY) ×2 IMPLANT
KIT ROOM TURNOVER OR (KITS) ×2 IMPLANT
NS IRRIG 1000ML POUR BTL (IV SOLUTION) ×2 IMPLANT
PAD ARMBOARD 7.5X6 YLW CONV (MISCELLANEOUS) ×2 IMPLANT
POUCH RETRIEVAL ECOSAC 10 (ENDOMECHANICALS) ×1 IMPLANT
POUCH RETRIEVAL ECOSAC 10MM (ENDOMECHANICALS) ×1
POUCH SPECIMEN RETRIEVAL 10MM (ENDOMECHANICALS) IMPLANT
SCISSORS LAP 5X35 DISP (ENDOMECHANICALS) ×2 IMPLANT
SET CHOLANGIOGRAPH 5 50 .035 (SET/KITS/TRAYS/PACK) IMPLANT
SET IRRIG TUBING LAPAROSCOPIC (IRRIGATION / IRRIGATOR) ×2 IMPLANT
SLEEVE ENDOPATH XCEL 5M (ENDOMECHANICALS) ×4 IMPLANT
SPECIMEN JAR SMALL (MISCELLANEOUS) ×2 IMPLANT
SPONGE GAUZE 2X2 STER 10/PKG (GAUZE/BANDAGES/DRESSINGS) ×1
STRIP CLOSURE SKIN 1/2X4 (GAUZE/BANDAGES/DRESSINGS) ×2 IMPLANT
SUT MNCRL AB 4-0 PS2 18 (SUTURE) ×2 IMPLANT
TOWEL OR 17X24 6PK STRL BLUE (TOWEL DISPOSABLE) ×2 IMPLANT
TOWEL OR 17X26 10 PK STRL BLUE (TOWEL DISPOSABLE) ×2 IMPLANT
TRAY LAPAROSCOPIC MC (CUSTOM PROCEDURE TRAY) ×2 IMPLANT
TROCAR XCEL BLUNT TIP 100MML (ENDOMECHANICALS) ×2 IMPLANT
TROCAR XCEL NON-BLD 11X100MML (ENDOMECHANICALS) ×2 IMPLANT
TROCAR XCEL NON-BLD 5MMX100MML (ENDOMECHANICALS) ×2 IMPLANT
TUBING INSUFFLATION (TUBING) ×2 IMPLANT

## 2016-09-09 NOTE — Transfer of Care (Signed)
Immediate Anesthesia Transfer of Care Note  Patient: Cletus GashElizabeth A Mandt  Procedure(s) Performed: Procedure(s): LAPAROSCOPIC CHOLECYSTECTOMY (N/A)  Patient Location: PACU  Anesthesia Type:General  Level of Consciousness: awake, alert  and oriented  Airway & Oxygen Therapy: Patient Spontanous Breathing and Patient connected to nasal cannula oxygen  Post-op Assessment: Report given to RN and Post -op Vital signs reviewed and stable  Post vital signs: Reviewed and stable  Last Vitals:  Vitals:   09/09/16 0452 09/09/16 1300  BP: (!) 154/73   Pulse: 91   Resp: 18   Temp: 37.4 C (P) 36.7 C    Last Pain:  Vitals:   09/09/16 0844  TempSrc:   PainSc: 9       Patients Stated Pain Goal: 3 (09/08/16 1927)  Complications: No apparent anesthesia complications

## 2016-09-09 NOTE — Progress Notes (Signed)
PROGRESS NOTE    Bonnie Hinton  NWG:[id number] DOB: [birth year] DOA: 09/04/2016 PCP: Sherlene Shamsullo, Teresa L, MD  Brief Narrative:Bonnie Hinton is a 76 y.o. female with medical history significant for hypertension, hyperlipidemia diabetes, hypothyroidism, cholelithiasis, GERD, chronic back pain presented to emergency department with worsening abdominal pain. Noted to have acute kidney injury, leukocytosis, hypokalemia, acute gallstone pancreatitis   Assessment & Plan:   Pancreatitis, gallstone/Cholecystitis -continues to improve -suspect passed stone, LFTS/Bili/lipase much better -Gi concerned that Victoza could have contributed to pancreatitis  -MRCP without CBD stone/obstruction, cholelithiases noted -HIDA scan with cholecystitis, started Zosyn -CCS following, going for Lap Chole today -Pt eval  AKI -due to pancreatitis, ? ATN -improving, creatinine down to 1.3 from 2.5 -baseline normal -stop IVF, improved -no hydronephrosis noted on MRI   Diabetes mellitus type 2, controlled (HCC) -holding victoza, SSI now   Hypertension -Hold Norvasc for now -Monitor   Hypothyroidism.  -synthroid 100 mcg IV   Obesity/Hx fatty liver  DVT prophylaxis:Hep SQ Code Status: Full Code Family Communication:None at bedside, called and d/w daughter Clydene PughMary Im 6/15 Disposition Plan: Home pending improvement, lap chole  Consultants: GI , CCS  Subjective: Feels better, waiting for surgery  Objective: Vitals:   09/09/16 1321 09/09/16 1330 09/09/16 1334 09/09/16 1435  BP:  (!) 176/82  (!) 165/79  Pulse: 77 79 79 78  Resp: 14 15 15 16   Temp:   98.2 F (36.8 C) 98.8 F (37.1 C)  TempSrc:    Oral  SpO2: 96% 96% 97% 97%  Weight:      Height:        Intake/Output Summary (Last 24 hours) at 09/09/16 1456 Last data filed at 09/09/16 1359  Gross per 24 hour  Intake             1230 ml  Output             1320 ml  Net              -90 ml   Filed Weights   09/05/16 0500  09/06/16 0500 09/09/16 0724  Weight: (!) 143.5 kg (316 lb 4 oz) (!) 144.6 kg (318 lb 12.8 oz) (!) 143.5 kg (316 lb 6.4 oz)    Examination:  Gen: Awake, Alert, Oriented X 3,  HEENT: PERRLA, Neck supple, no JVD Lungs: decreased BS at bases CVS: RRR,No Gallops,Rubs or new Murmurs Abd: soft, mild RUQ tenderness, non distended, BS present Extremities: No Cyanosis, Clubbing or edema Skin: no new rashes     Data Reviewed:   CBC:  Recent Labs Lab 09/04/16 0800 09/05/16 0446 09/06/16 0504 09/07/16 0630 09/08/16 0738  WBC 16.8* 14.5* 9.9 10.0 9.3  NEUTROABS 15.7*  --   --   --   --   HGB 14.4 12.1 11.9* 11.8* 12.9  HCT 43.4 37.4 37.9 37.3 40.4  MCV 89.9 90.6 92.0 91.2 91.2  PLT 287 207 183 200 206   Basic Metabolic Panel:  Recent Labs Lab 09/04/16 0800 09/05/16 0446 09/06/16 0504 09/07/16 0630 09/08/16 0738  NA 133* 135 137 138 139  K 3.2* 4.1 4.4 3.9 4.0  CL 98* 102 103 103 104  CO2 15* 24 24 24 24   GLUCOSE 184* 166* 175* 114* 104*  BUN 26* 41* 33* 20 17  CREATININE 2.43* 2.50* 1.66* 1.26* 1.16*  CALCIUM 8.1* 7.5* 7.8* 8.0* 8.6*   GFR: Estimated Creatinine Clearance: 60.6 mL/min (A) (by C-G formula based on SCr of 1.16 mg/dL (H)). Liver Function Tests:  Recent Labs Lab 09/04/16 0800 09/05/16 0446 09/06/16 0504 09/07/16 0630 09/08/16 0738  AST 859* 251* 77* 31 23  ALT 867* 506* 298* 193* 127*  ALKPHOS 213* 154* 145* 145* 125  BILITOT 4.7* 2.7* 1.2 0.7 0.7  PROT 6.8 6.3* 6.4* 6.4* 6.7  ALBUMIN 3.3* 2.7* 2.5* 2.3* 2.4*    Recent Labs Lab 09/04/16 0800 09/05/16 0446 09/06/16 0504 09/07/16 0630  LIPASE 723* 148* 69* 56*   No results for input(s): AMMONIA in the last 168 hours. Coagulation Profile:  Recent Labs Lab 09/04/16 1442  INR 1.37   Cardiac Enzymes: No results for input(s): CKTOTAL, CKMB, CKMBINDEX, TROPONINI in the last 168 hours. BNP (last 3 results) No results for input(s): PROBNP in the last 8760 hours. HbA1C: No results for  input(s): HGBA1C in the last 72 hours. CBG:  Recent Labs Lab 09/08/16 2011 09/09/16 0016 09/09/16 0450 09/09/16 0823 09/09/16 1319  GLUCAP 153* 150* 140* 145* 127*   Lipid Profile: No results for input(s): CHOL, HDL, LDLCALC, TRIG, CHOLHDL, LDLDIRECT in the last 72 hours. Thyroid Function Tests: No results for input(s): TSH, T4TOTAL, FREET4, T3FREE, THYROIDAB in the last 72 hours. Anemia Panel: No results for input(s): VITAMINB12, FOLATE, FERRITIN, TIBC, IRON, RETICCTPCT in the last 72 hours. Urine analysis:    Component Value Date/Time   COLORURINE AMBER (A) 09/04/2016 1616   APPEARANCEUR CLOUDY (A) 09/04/2016 1616   LABSPEC 1.020 09/04/2016 1616   PHURINE 5.0 09/04/2016 1616   GLUCOSEU 50 (A) 09/04/2016 1616   GLUCOSEU NEGATIVE 04/25/2015 1359   HGBUR MODERATE (A) 09/04/2016 1616   BILIRUBINUR SMALL (A) 09/04/2016 1616   BILIRUBINUR neg 04/25/2015 1450   KETONESUR NEGATIVE 09/04/2016 1616   PROTEINUR 100 (A) 09/04/2016 1616   UROBILINOGEN 0.2 04/25/2015 1450   UROBILINOGEN 0.2 04/25/2015 1359   NITRITE NEGATIVE 09/04/2016 1616   LEUKOCYTESUR TRACE (A) 09/04/2016 1616   Sepsis Labs: @LABRCNTIP (procalcitonin:4,lacticidven:4)  ) Recent Results (from the past 240 hour(s))  MRSA PCR Screening     Status: None   Collection Time: 09/04/16  4:53 PM  Result Value Ref Range Status   MRSA by PCR NEGATIVE NEGATIVE Final    Comment:        The GeneXpert MRSA Assay (FDA approved for NASAL specimens only), is one component of a comprehensive MRSA colonization surveillance program. It is not intended to diagnose MRSA infection nor to guide or monitor treatment for MRSA infections.   Surgical pcr screen     Status: None   Collection Time: 09/08/16  2:33 PM  Result Value Ref Range Status   MRSA, PCR NEGATIVE NEGATIVE Final   Staphylococcus aureus NEGATIVE NEGATIVE Final    Comment:        The Xpert SA Assay (FDA approved for NASAL specimens in patients over 21 years  of age), is one component of a comprehensive surveillance program.  Test performance has been validated by [facility] for patients greater than or equal to 7 year old. It is not intended to diagnose infection nor to guide or monitor treatment.          Radiology Studies: Nm Hepatobiliary Liver Func  Result Date: 09/08/2016 CLINICAL DATA:  Abdominal pain. Right upper quadrant abdominal pain the last few days. Acute kidney injury, leukocytosis, hypoproteinemia, acute gallstone pancreatitis. EXAM: NUCLEAR MEDICINE HEPATOBILIARY IMAGING TECHNIQUE: Sequential images of the abdomen were obtained out to 60 minutes following intravenous administration of radiopharmaceutical. RADIOPHARMACEUTICALS:  5.25 MCi Tc-56m  Choletec IV COMPARISON:  MRI 09/04/2016 FINDINGS: There is  prompt uptake by the liver. Early activity is identified within the common bile duct and bowel. Initial imaging fails to demonstrate the gallbladder. 3 mg of morphine sulfate was given intravenously. Subsequent images are performed to 30 minutes, again demonstrating no evidence for gallbladder filling. IMPRESSION: Findings are consistent with acute cholecystitis. Electronically Signed   By: Norva Pavlov M.D.   On: 09/08/2016 10:44        Scheduled Meds: . Chlorhexidine Gluconate Cloth  6 each Topical Once  . cycloSPORINE  1 drop Both Eyes BID  . heparin  5,000 Units Subcutaneous Q8H  . insulin aspart  0-9 Units Subcutaneous Q4H  . levothyroxine  100 mcg Intravenous Daily  . sodium chloride flush  3 mL Intravenous Q12H   Continuous Infusions: . ceFAZolin    . ceFAZolin    . dextrose 5% lactated ringers 10 mL/hr at 09/08/16 1927  . piperacillin-tazobactam (ZOSYN)  IV Stopped (09/09/16 0900)  . sodium chloride       LOS: 5 days    Time spent:    Zannie Cove, MD [facility] Pager [phone]  If 7PM-7AM, please contact night-coverage [email or web address] Password [facility] 09/09/2016, 2:56 PM

## 2016-09-09 NOTE — Anesthesia Postprocedure Evaluation (Signed)
Anesthesia Post Note  Patient: Bonnie GashElizabeth A Hinton  Procedure(s) Performed: Procedure(s) (LRB): LAPAROSCOPIC CHOLECYSTECTOMY (N/A)     Patient location during evaluation: PACU Anesthesia Type: General Level of consciousness: sedated Pain management: pain level controlled Vital Signs Assessment: post-procedure vital signs reviewed and stable Respiratory status: spontaneous breathing and respiratory function stable Cardiovascular status: stable Anesthetic complications: no    Last Vitals:  Vitals:   09/09/16 1330 09/09/16 1334  BP: (!) 176/82   Pulse: 79 79  Resp: 15 15  Temp:  36.8 C    Last Pain:  Vitals:   09/09/16 1330  TempSrc:   PainSc: Asleep                 Jasmin Winberry DANIEL

## 2016-09-09 NOTE — Progress Notes (Signed)
Subjective/Chief Complaint: RUQ pain  Patient is still having significant RUQ abdominal pain - using PO pain meds overnight. No nausea or vomiting. Positive HIDA consistent with acute cholecystitis   Objective: Vital signs in last 24 hours: Temp:  [99.3 F (37.4 C)-99.6 F (37.6 C)] 99.3 F (37.4 C) (06/18 0452) Pulse Rate:  [80-91] 91 (06/18 0452) Resp:  [18-20] 18 (06/18 0452) BP: (134-154)/(58-73) 154/73 (06/18 0452) SpO2:  [97 %-99 %] 97 % (06/18 0452) Weight:  [143.5 kg (316 lb 6.4 oz)] 143.5 kg (316 lb 6.4 oz) (06/18 0724) Last BM Date: 09/04/16  Intake/Output from previous day: 06/17 0701 - 06/18 0700 In: 280 [P.O.:30; I.V.:150; IV Piggyback:100] Out: 1050 [Urine:1050] Intake/Output this shift: No intake/output data recorded.  WDWN in NAD Eyes:  Pupils equal, round; sclera anicteric HENT:  Oral mucosa moist; [poor dentition Lungs:  CTA bilaterally; normal respiratory effort CV:  Regular rate and rhythm; no murmurs; extremities well-perfused with no edema Abd:  +bowel sounds, obese, mildly distended; tender in RUQ; no hepatosplenomegaly palpated Skin:  Warm, dry; no sign of jaundice Psychiatric - alert and oriented x 4; calm mood and affect  Lab Results:   Recent Labs  09/07/16 0630 09/08/16 0738  WBC 10.0 9.3  HGB 11.8* 12.9  HCT 37.3 40.4  PLT 200 206   BMET  Recent Labs  09/07/16 0630 09/08/16 0738  NA 138 139  K 3.9 4.0  CL 103 104  CO2 24 24  GLUCOSE 114* 104*  BUN 20 17  CREATININE 1.26* 1.16*  CALCIUM 8.0* 8.6*   Hepatic Function Latest Ref Rng & Units 09/08/2016 09/07/2016 09/06/2016  Total Protein 6.5 - 8.1 g/dL 6.7 6.4(L) 6.4(L)  Albumin 3.5 - 5.0 g/dL 2.4(L) 2.3(L) 2.5(L)  AST 15 - 41 U/L 23 31 77(H)  ALT 14 - 54 U/L 127(H) 193(H) 298(H)  Alk Phosphatase 38 - 126 U/L 125 145(H) 145(H)  Total Bilirubin 0.3 - 1.2 mg/dL 0.7 0.7 1.2  Bilirubin, Direct 0.0 - 0.3 mg/dL - - -    PT/INR No results for input(s): LABPROT, INR in the  last 72 hours. ABG No results for input(s): PHART, HCO3 in the last 72 hours.  Invalid input(s): PCO2, PO2  Studies/Results: Nm Hepatobiliary Liver Func  Result Date: 09/08/2016 CLINICAL DATA:  Abdominal pain. Right upper quadrant abdominal pain the last few days. Acute kidney injury, leukocytosis, hypoproteinemia, acute gallstone pancreatitis. EXAM: NUCLEAR MEDICINE HEPATOBILIARY IMAGING TECHNIQUE: Sequential images of the abdomen were obtained out to 60 minutes following intravenous administration of radiopharmaceutical. RADIOPHARMACEUTICALS:  5.25 MCi Tc-96m Choletec IV COMPARISON:  MRI 09/04/2016 FINDINGS: There is prompt uptake by the liver. Early activity is identified within the common bile duct and bowel. Initial imaging fails to demonstrate the gallbladder. 3 mg of morphine sulfate was given intravenously. Subsequent images are performed to 30 minutes, again demonstrating no evidence for gallbladder filling. IMPRESSION: Findings are consistent with acute cholecystitis. Electronically Signed   By: ENolon NationsM.D.   On: 09/08/2016 10:44    Anti-infectives: Anti-infectives    Start     Dose/Rate Route Frequency Ordered Stop   09/08/16 1200  piperacillin-tazobactam (ZOSYN) IVPB 3.375 g     3.375 g 12.5 mL/hr over 240 Minutes Intravenous Every 8 hours 09/08/16 1106     09/05/16 1630  cefTRIAXone (ROCEPHIN) 2 g in dextrose 5 % 50 mL IVPB  Status:  Discontinued    Comments:  Pharmacy may adjust dosing strength / duration / interval for maximal efficacy  2 g 100 mL/hr over 30 Minutes Intravenous Every 24 hours 09/05/16 1520 09/08/16 1047   09/04/16 1330  cefTRIAXone (ROCEPHIN) 2 g in dextrose 5 % 50 mL IVPB  Status:  Discontinued     2 g 100 mL/hr over 30 Minutes Intravenous Every 24 hours 09/04/16 1328 09/04/16 1535   09/04/16 1115  piperacillin-tazobactam (ZOSYN) IVPB 3.375 g  Status:  Discontinued     3.375 g 100 mL/hr over 30 Minutes Intravenous  Once 09/04/16 1114 09/04/16  1327      Assessment/Plan: s/p Procedure(s): LAPAROSCOPIC CHOLECYSTECTOMY WITH INTRAOPERATIVE CHOLANGIOGRAM (N/A) Acute cholecystitis Gallstone pancreatitis - admitted 09/04/16 Morbid obesity  Will plan laparoscopic cholecystectomy with cholangiogram today.  The surgical procedure has been discussed with the patient.  Potential risks, benefits, alternative treatments, and expected outcomes have been explained.  All of the patient's questions at this time have been answered.  The likelihood of reaching the patient's treatment goal is good.  The patient understand the proposed surgical procedure and wishes to proceed.   LOS: 5 days    Ellijah Leffel K. 09/09/2016

## 2016-09-09 NOTE — Op Note (Signed)
Laparoscopic Cholecystectomy Procedure Note  Indications: This patient presents with symptomatic gallbladder disease and will undergo laparoscopic cholecystectomy.  HIDA scan consistent with acute cholecystitis.  T. Bili 0.7.  Patient has allergy to contrast dye, so we will forgo cholangiogram.  Pre-operative Diagnosis: Calculus of gallbladder with acute cholecystitis, without mention of obstruction  Post-operative Diagnosis: Same  Surgeon: Illya Gienger K.   Assistants: Magnus IvanSheila Bowman, RNFA  Anesthesia: General endotracheal anesthesia  ASA Class: 2  Procedure Details  The patient was seen again in the Holding Room. The risks, benefits, complications, treatment options, and expected outcomes were discussed with the patient. The possibilities of reaction to medication, pulmonary aspiration, perforation of viscus, bleeding, recurrent infection, finding a normal gallbladder, the need for additional procedures, failure to diagnose a condition, the possible need to convert to an open procedure, and creating a complication requiring transfusion or operation were discussed with the patient. The likelihood of improving the patient's symptoms with return to their baseline status is good.  The patient and/or family concurred with the proposed plan, giving informed consent. The site of surgery properly noted. The patient was taken to Operating Room, identified as Bonnie Hinton and the procedure verified as Laparoscopic Cholecystectomy with Intraoperative Cholangiogram. A Time Out was held and the above information confirmed.  Prior to the induction of general anesthesia, antibiotic prophylaxis was administered. General endotracheal anesthesia was then administered and tolerated well. After the induction, the abdomen was prepped with Chloraprep and draped in sterile fashion. The patient was positioned in the supine position.  Local anesthetic agent was injected into the skin above the umbilicus and an  incision made. We dissected down to the abdominal fascia with blunt dissection.  The fascia was incised vertically and we entered the peritoneal cavity bluntly.  A pursestring suture of 0-Vicryl was placed around the fascial opening.  The Hasson cannula was inserted and secured with the stay suture.  Pneumoperitoneum was then created with CO2 and tolerated well without any adverse changes in the patient's vital signs. An 11-mm port was placed in the subxiphoid position.  Two 5-mm ports were placed in the right upper quadrant. All skin incisions were infiltrated with a local anesthetic agent before making the incision and placing the trocars.   We positioned the patient in reverse Trendelenburg, tilted slightly to the patient's left.  We were unable to adequately visualize the gallbladder because of the distended transverse colon and the patient's very long torso.  We placed an additional 5 mm port in the midline and moved the camera to this port.  The gallbladder was identified, the fundus grasped and retracted cephalad. Adhesions were lysed bluntly and with the electrocautery where indicated, taking care not to injure any adjacent organs or viscus. The gallbladder was drained and showed some clear hydrops fluid.  The infundibulum was grasped and retracted laterally, exposing the peritoneum overlying the triangle of Calot. This was then divided and exposed in a blunt fashion. The cystic duct was clearly identified and bluntly dissected circumferentially. A critical view of the cystic duct and cystic artery was obtained.  The cystic duct was then ligated with clips and divided. The cystic artery was, dissected free, ligated with clips and divided as well.   The gallbladder was dissected from the liver bed in retrograde fashion with the electrocautery. The gallbladder was removed and placed in an Endocatch sac. The liver bed was irrigated and inspected. Hemostasis was achieved with the electrocautery and Surgicel  SNOW. Copious irrigation was utilized and was  repeatedly aspirated until clear.  The gallbladder and Endocatch sac were then removed through the umbilical port site.  The pursestring suture was used to close the umbilical fascia.    We again inspected the right upper quadrant for hemostasis.  Pneumoperitoneum was released as we removed the trocars.  4-0 Monocryl was used to close the skin.   Benzoin, steri-strips, and clean dressings were applied. The patient was then extubated and brought to the recovery room in stable condition. Instrument, sponge, and needle counts were correct at closure and at the conclusion of the case.   Findings: Cholecystitis with Cholelithiasis  Estimated Blood Loss: less than 100 mL         Drains: none         Specimens: Gallbladder           Complications: None; patient tolerated the procedure well.         Disposition: PACU - hemodynamically stable.         Condition: stable  Bonnie Hinton. Bonnie Skains, MD, [facility] Surgery  General/ Trauma Surgery  09/09/2016 12:40 PM

## 2016-09-09 NOTE — Anesthesia Preprocedure Evaluation (Addendum)
Anesthesia Evaluation  Patient identified by MRN, date of birth, ID band Patient awake    Reviewed: Allergy & Precautions, NPO status , Patient's Chart, lab work & pertinent test results  History of Anesthesia Complications Negative for: history of anesthetic complications  Airway Mallampati: III  TM Distance: >3 FB Neck ROM: Full    Dental no notable dental hx. (+) Dental Advisory Given, Poor Dentition   Pulmonary neg pulmonary ROS,    Pulmonary exam normal        Cardiovascular hypertension, Pt. on medications Normal cardiovascular exam  Study Conclusions  - Left ventricle: The cavity size was normal. There was mild   concentric hypertrophy. Systolic function was normal. The   estimated ejection fraction was in the range of 55% to 60%. Wall   motion was normal; there were no regional wall motion   abnormalities. Left ventricular diastolic function parameters   were normal.   Neuro/Psych PSYCHIATRIC DISORDERS Anxiety CVA    GI/Hepatic GERD  ,  Endo/Other  diabetesHypothyroidism   Renal/GU Renal InsufficiencyRenal disease     Musculoskeletal   Abdominal   Peds  Hematology   Anesthesia Other Findings   Reproductive/Obstetrics                            Anesthesia Physical Anesthesia Plan  ASA: III  Anesthesia Plan: General   Post-op Pain Management:    Induction: Intravenous, Rapid sequence and Cricoid pressure planned  PONV Risk Score and Plan: 3 and Ondansetron, Dexamethasone, Diphenhydramine and Treatment may vary due to age or medical condition  Airway Management Planned: Oral ETT  Additional Equipment:   Intra-op Plan:   Post-operative Plan: Extubation in OR  Informed Consent: I have reviewed the patients History and Physical, chart, labs and discussed the procedure including the risks, benefits and alternatives for the proposed anesthesia with the patient or  authorized representative who has indicated his/her understanding and acceptance.   Dental advisory given  Plan Discussed with: Anesthesiologist and CRNA  Anesthesia Plan Comments:       Anesthesia Quick Evaluation

## 2016-09-09 NOTE — Anesthesia Procedure Notes (Signed)
Procedure Name: Intubation Date/Time: 09/09/2016 11:15 AM Performed by: Marena ChancyBECKNER, Aza Dantes S Pre-anesthesia Checklist: Patient identified, Emergency Drugs available, Suction available and Patient being monitored Patient Re-evaluated:Patient Re-evaluated prior to inductionOxygen Delivery Method: Circle System Utilized Preoxygenation: Pre-oxygenation with 100% oxygen Intubation Type: IV induction Ventilation: Mask ventilation without difficulty Laryngoscope Size: Glidescope Grade View: Grade I Tube type: Oral Tube size: 7.5 mm Number of attempts: 1 Airway Equipment and Method: Stylet,  Oral airway and Video-laryngoscopy Placement Confirmation: ETT inserted through vocal cords under direct vision,  positive ETCO2 and breath sounds checked- equal and bilateral Tube secured with: Tape Dental Injury: Teeth and Oropharynx as per pre-operative assessment

## 2016-09-09 NOTE — Care Management Important Message (Signed)
Important Message  Patient Details  Name: Bonnie Hinton MRN: [id number] Date of Birth: [birth year]   Medicare Important Message Given:  Yes    Kyla BalzarineShealy, [name] [name] 09/09/2016, 1:43 PM

## 2016-09-09 NOTE — Progress Notes (Signed)
Report given to Sylvester Minton rn as caregiver 

## 2016-09-10 ENCOUNTER — Encounter (HOSPITAL_COMMUNITY): Payer: Self-pay | Admitting: Surgery

## 2016-09-10 LAB — CBC
HCT: 40.2 % (ref 36.0–46.0)
Hemoglobin: 12.9 g/dL (ref 12.0–15.0)
MCH: 29.4 pg (ref 26.0–34.0)
MCHC: 32.1 g/dL (ref 30.0–36.0)
MCV: 91.6 fL (ref 78.0–100.0)
Platelets: 220 10*3/uL (ref 150–400)
RBC: 4.39 MIL/uL (ref 3.87–5.11)
RDW: 14.5 % (ref 11.5–15.5)
WBC: 14.7 10*3/uL — ABNORMAL HIGH (ref 4.0–10.5)

## 2016-09-10 LAB — COMPREHENSIVE METABOLIC PANEL
ALT: 58 U/L — ABNORMAL HIGH (ref 14–54)
AST: 58 U/L — ABNORMAL HIGH (ref 15–41)
Albumin: 2.3 g/dL — ABNORMAL LOW (ref 3.5–5.0)
Alkaline Phosphatase: 115 U/L (ref 38–126)
Anion gap: 12 (ref 5–15)
BUN: 14 mg/dL (ref 6–20)
CO2: 23 mmol/L (ref 22–32)
Calcium: 8.2 mg/dL — ABNORMAL LOW (ref 8.9–10.3)
Chloride: 103 mmol/L (ref 101–111)
Creatinine, Ser: 1.24 mg/dL — ABNORMAL HIGH (ref 0.44–1.00)
GFR calc Af Amer: 48 mL/min — ABNORMAL LOW (ref >60)
GFR calc non Af Amer: 41 mL/min — ABNORMAL LOW (ref >60)
Glucose, Bld: 135 mg/dL — ABNORMAL HIGH (ref 65–99)
Potassium: 3.6 mmol/L (ref 3.5–5.1)
Sodium: 138 mmol/L (ref 135–145)
Total Bilirubin: 0.8 mg/dL (ref 0.3–1.2)
Total Protein: 6.7 g/dL (ref 6.5–8.1)

## 2016-09-10 LAB — GLUCOSE, CAPILLARY
Glucose-Capillary: 131 mg/dL — ABNORMAL HIGH (ref 65–99)
Glucose-Capillary: 145 mg/dL — ABNORMAL HIGH (ref 65–99)
Glucose-Capillary: 146 mg/dL — ABNORMAL HIGH (ref 65–99)
Glucose-Capillary: 150 mg/dL — ABNORMAL HIGH (ref 65–99)
Glucose-Capillary: 154 mg/dL — ABNORMAL HIGH (ref 65–99)
Glucose-Capillary: 175 mg/dL — ABNORMAL HIGH (ref 65–99)

## 2016-09-10 MED ORDER — AMLODIPINE BESYLATE 5 MG PO TABS
5.0000 mg | ORAL_TABLET | Freq: Every day | ORAL | Status: DC
  Administered 2016-09-10 – 2016-09-14 (×5): 5 mg via ORAL
  Filled 2016-09-10 (×5): qty 1

## 2016-09-10 NOTE — Discharge Instructions (Signed)

## 2016-09-10 NOTE — Progress Notes (Signed)
[facility]/Trauma Progress Note  1 Day Post-Op   Subjective:  CC: RUQ pain  Pt states her pain is about the same as before surgery. She is tolerating clears. She has not had a BM or flatus. She denies fever or chills. No acute events overnight.   Objective: Vital signs in last 24 hours: Temp:  [98 F (36.7 C)-99.6 F (37.6 C)] 99.6 F (37.6 C) (06/19 0431) Pulse Rate:  [77-95] 95 (06/19 0431) Resp:  [14-25] 18 (06/19 0431) BP: (158-190)/(70-83) 158/71 (06/19 0431) SpO2:  [96 %-100 %] 96 % (06/19 0431) Weight:  [310 lb 11.2 oz (140.9 kg)] 310 lb 11.2 oz (140.9 kg) (06/19 0500) Last BM Date: 09/04/16  Intake/Output from previous day: 06/18 0701 - 06/19 0700 In: 1000 [I.V.:1000] Out: 270 [Urine:250; Blood:20] Intake/Output this shift: No intake/output data recorded.  PE: Gen:  Alert, NAD, cooperative Card:  RRR, no M/G/R heard Pulm:  Rate and effort normal Abd: Soft, obese, not distended, hypoactive BS, incisions C/D/I, mild generalized TTP worse in the RUQ and epigastric regions Skin: no rashes noted, warm and dry  Lab Results:   Recent Labs  09/08/16 0738  WBC 9.3  HGB 12.9  HCT 40.4  PLT 206   BMET  Recent Labs  09/08/16 0738  NA 139  K 4.0  CL 104  CO2 24  GLUCOSE 104*  BUN 17  CREATININE 1.16*  CALCIUM 8.6*   PT/INR No results for input(s): LABPROT, INR in the last 72 hours. CMP     Component Value Date/Time   NA 139 09/08/2016 0738   NA 139 01/24/2016   K 4.0 09/08/2016 0738   CL 104 09/08/2016 0738   CO2 24 09/08/2016 0738   GLUCOSE 104 (H) 09/08/2016 0738   BUN 17 09/08/2016 0738   BUN 24 (A) 01/24/2016   CREATININE 1.16 (H) 09/08/2016 0738   CALCIUM 8.6 (L) 09/08/2016 0738   PROT 6.7 09/08/2016 0738   ALBUMIN 2.4 (L) 09/08/2016 0738   AST 23 09/08/2016 0738   ALT 127 (H) 09/08/2016 0738   ALKPHOS 125 09/08/2016 0738   BILITOT 0.7 09/08/2016 0738   GFRNONAA 45 (L) 09/08/2016 0738   GFRAA 52 (L) 09/08/2016 0738    Lipase     Component Value Date/Time   LIPASE 56 (H) 09/07/2016 0630    Studies/Results: Nm Hepatobiliary Liver Func  Result Date: 09/08/2016 CLINICAL DATA:  Abdominal pain. Right upper quadrant abdominal pain the last few days. Acute kidney injury, leukocytosis, hypoproteinemia, acute gallstone pancreatitis. EXAM: NUCLEAR MEDICINE HEPATOBILIARY IMAGING TECHNIQUE: Sequential images of the abdomen were obtained out to 60 minutes following intravenous administration of radiopharmaceutical. RADIOPHARMACEUTICALS:  5.25 MCi Tc-992m  Choletec IV COMPARISON:  MRI 09/04/2016 FINDINGS: There is prompt uptake by the liver. Early activity is identified within the common bile duct and bowel. Initial imaging fails to demonstrate the gallbladder. 3 mg of morphine sulfate was given intravenously. Subsequent images are performed to 30 minutes, again demonstrating no evidence for gallbladder filling. IMPRESSION: Findings are consistent with acute cholecystitis. Electronically Signed   By: Norva PavlovElizabeth  Brown M.D.   On: 09/08/2016 10:44    Anti-infectives: Anti-infectives    Start     Dose/Rate Route Frequency Ordered Stop   09/09/16 1059  ceFAZolin (ANCEF) 1-4 GM/50ML-% IVPB    Comments:  Yvonne KendallBeckner, Zachary   : cabinet override      09/09/16 1059 09/09/16 2314   09/09/16 1059  ceFAZolin (ANCEF) 2-4 GM/100ML-% IVPB    Comments:  Beckner,  Zachary   : cabinet override      09/09/16 1059 09/09/16 2314   09/08/16 1200  piperacillin-tazobactam (ZOSYN) IVPB 3.375 g     3.375 g 12.5 mL/hr over 240 Minutes Intravenous Every 8 hours 09/08/16 1106     09/05/16 1630  cefTRIAXone (ROCEPHIN) 2 g in dextrose 5 % 50 mL IVPB  Status:  Discontinued    Comments:  Pharmacy may adjust dosing strength / duration / interval for maximal efficacy   2 g 100 mL/hr over 30 Minutes Intravenous Every 24 hours 09/05/16 1520 09/08/16 1047   09/04/16 1330  cefTRIAXone (ROCEPHIN) 2 g in dextrose 5 % 50 mL IVPB  Status:  Discontinued     2  g 100 mL/hr over 30 Minutes Intravenous Every 24 hours 09/04/16 1328 09/04/16 1535   09/04/16 1115  piperacillin-tazobactam (ZOSYN) IVPB 3.375 g  Status:  Discontinued     3.375 g 100 mL/hr over 30 Minutes Intravenous  Once 09/04/16 1114 09/04/16 1327       Assessment/Plan  AKI DM type II HTN Hypothyroidism Obesity  Gallstone pancreatitis, acute cholecystits S/P laparoscopic cholecystectomy, Dr. Corliss Skains, 09/09/16  FEN: clears  VTE: heparin  ID: Zosyn 6/13; 6/18>>   Rocephin 6/13>6/17  DISPO: Pt needs to ambulate. She has not had flatus so I would keep her on clears. PT pending. Recheck CMP today    LOS: 6 days    Jerre Simon , [facility] 09/10/2016, 9:04 AM Pager: [phone] Consults: [phone] Mon-Fri 7:00 am-4:30 pm Sat-Sun 7:00 am-11:30 am

## 2016-09-10 NOTE — Progress Notes (Signed)
PROGRESS NOTE    Bonnie Hinton  ZOX:[id number] DOB: April 28, 1940 DOA: 09/04/2016 PCP: Sherlene Shams, MD  Brief Narrative:Bonnie Hinton is a 76 y.o. female with medical history significant for hypertension, hyperlipidemia diabetes, hypothyroidism, cholelithiasis, GERD, chronic back pain presented to emergency department with worsening abdominal pain. Noted to have acute kidney injury, leukocytosis, hypokalemia, acute gallstone pancreatitis. -improving slowly, pancreatitis treated with supportive care, MRCP showed no CBD obstruction, seen by GI, ERCP not felt to be indicated, LFTs/Bili improved, finally underwent Lap Chole yesterday CCS following, now on clears, PT consulted  Assessment & Plan:   Pancreatitis, gallstone/Cholecystitis -continues to improve -suspect passed stone, LFTS/Bili/lipase much better -Gi concerned that Victoza could have contributed to pancreatitis  -MRCP without CBD stone/obstruction, cholelithiases noted -HIDA scan with cholecystitis, started Zosyn -CCS following, s/p Lap Chole 6/18 -now on clears, no bowel sounds, Ambulate, OOB -Pt eval  AKI -due to pancreatitis, ? ATN -improving, creatinine down to 1.3 from 2.5 -baseline normal -stopped IVF, improved -no hydronephrosis noted on MRI   Diabetes mellitus type 2, controlled (HCC) -holding victoza, SSI now   Hypertension -resume norvasc   Hypothyroidism.  -synthroid 100 mcg IV   Obesity/Hx fatty liver  DVT prophylaxis:Hep SQ Code Status: Full Code Family Communication:None at bedside, called and d/w daughter Bonnie Hinton 6/16 Disposition Plan: Home pending improvement, tolerating diet etc, may need SNF-PT eval pending too  Consultants: GI , CCS  Subjective: C/o mod amount of pain, no flatus, no N/V, no dyspnea  Objective: Vitals:   09/10/16 0431 09/10/16 0500 09/10/16 1119 09/10/16 1300  BP: (!) 158/71  (!) 164/71 (!) 143/63  Pulse: 95   85  Resp: 18   17  Temp: 99.6 F (37.6  C)   99.3 F (37.4 C)  TempSrc: Oral   Oral  SpO2: 96%   99%  Weight:  (!) 140.9 kg (310 lb 11.2 oz)    Height:        Intake/Output Summary (Last 24 hours) at 09/10/16 1427 Last data filed at 09/10/16 1000  Gross per 24 hour  Intake              480 ml  Output                0 ml  Net              480 ml   Filed Weights   09/06/16 0500 09/09/16 0724 09/10/16 0500  Weight: (!) 144.6 kg (318 lb 12.8 oz) (!) 143.5 kg (316 lb 6.4 oz) (!) 140.9 kg (310 lb 11.2 oz)    Examination:  Gen: Awake, Alert, Oriented X 3, obese, no distress HEENT: PERRLA, Neck supple, no JVD Lungs: decreased BS at bases, improved air movement otherwise CVS: RRR,No Gallops,Rubs or new Murmurs Abd: soft, mild R sided tenderness, BS absent, distended Extremities: trace edema Skin: no new rashes   Data Reviewed:   CBC:  Recent Labs Lab 09/04/16 0800 09/05/16 0446 09/06/16 0504 09/07/16 0630 09/08/16 0738 09/10/16 0814  WBC 16.8* 14.5* 9.9 10.0 9.3 14.7*  NEUTROABS 15.7*  --   --   --   --   --   HGB 14.4 12.1 11.9* 11.8* 12.9 12.9  HCT 43.4 37.4 37.9 37.3 40.4 40.2  MCV 89.9 90.6 92.0 91.2 91.2 91.6  PLT 287 207 183 200 206 220   Basic Metabolic Panel:  Recent Labs Lab 09/05/16 0446 09/06/16 0504 09/07/16 0630 09/08/16 0738 09/10/16 0814  NA 135 137 138  139 138  K 4.1 4.4 3.9 4.0 3.6  CL 102 103 103 104 103  CO2 24 24 24 24 23   GLUCOSE 166* 175* 114* 104* 135*  BUN 41* 33* 20 17 14   CREATININE 2.50* 1.66* 1.26* 1.16* 1.24*  CALCIUM 7.5* 7.8* 8.0* 8.6* 8.2*   GFR: Estimated Creatinine Clearance: 56.1 mL/min (A) (by C-G formula based on SCr of 1.24 mg/dL (H)). Liver Function Tests:  Recent Labs Lab 09/05/16 0446 09/06/16 0504 09/07/16 0630 09/08/16 0738 09/10/16 0814  AST 251* 77* 31 23 58*  ALT 506* 298* 193* 127* 58*  ALKPHOS 154* 145* 145* 125 115  BILITOT 2.7* 1.2 0.7 0.7 0.8  PROT 6.3* 6.4* 6.4* 6.7 6.7  ALBUMIN 2.7* 2.5* 2.3* 2.4* 2.3*    Recent Labs Lab  09/04/16 0800 09/05/16 0446 09/06/16 0504 09/07/16 0630  LIPASE 723* 148* 69* 56*   No results for input(s): AMMONIA in the last 168 hours. Coagulation Profile:  Recent Labs Lab 09/04/16 1442  INR 1.37   Cardiac Enzymes: No results for input(s): CKTOTAL, CKMB, CKMBINDEX, TROPONINI in the last 168 hours. BNP (last 3 results) No results for input(s): PROBNP in the last 8760 hours. HbA1C: No results for input(s): HGBA1C in the last 72 hours. CBG:  Recent Labs Lab 09/09/16 2025 09/10/16 0031 09/10/16 0430 09/10/16 0747 09/10/16 1134  GLUCAP 131* 146* 145* 131* 175*   Lipid Profile: No results for input(s): CHOL, HDL, LDLCALC, TRIG, CHOLHDL, LDLDIRECT in the last 72 hours. Thyroid Function Tests: No results for input(s): TSH, T4TOTAL, FREET4, T3FREE, THYROIDAB in the last 72 hours. Anemia Panel: No results for input(s): VITAMINB12, FOLATE, FERRITIN, TIBC, IRON, RETICCTPCT in the last 72 hours. Urine analysis:    Component Value Date/Time   COLORURINE AMBER (A) 09/04/2016 1616   APPEARANCEUR CLOUDY (A) 09/04/2016 1616   LABSPEC 1.020 09/04/2016 1616   PHURINE 5.0 09/04/2016 1616   GLUCOSEU 50 (A) 09/04/2016 1616   GLUCOSEU NEGATIVE 04/25/2015 1359   HGBUR MODERATE (A) 09/04/2016 1616   BILIRUBINUR SMALL (A) 09/04/2016 1616   BILIRUBINUR neg 04/25/2015 1450   KETONESUR NEGATIVE 09/04/2016 1616   PROTEINUR 100 (A) 09/04/2016 1616   UROBILINOGEN 0.2 04/25/2015 1450   UROBILINOGEN 0.2 04/25/2015 1359   NITRITE NEGATIVE 09/04/2016 1616   LEUKOCYTESUR TRACE (A) 09/04/2016 1616   Sepsis Labs: @LABRCNTIP (procalcitonin:4,lacticidven:4)  ) Recent Results (from the past 240 hour(s))  MRSA PCR Screening     Status: None   Collection Time: 09/04/16  4:53 PM  Result Value Ref Range Status   MRSA by PCR NEGATIVE NEGATIVE Final    Comment:        The GeneXpert MRSA Assay (FDA approved for NASAL specimens only), is one component of a comprehensive MRSA  colonization surveillance program. It is not intended to diagnose MRSA infection nor to guide or monitor treatment for MRSA infections.   Surgical pcr screen     Status: None   Collection Time: 09/08/16  2:33 PM  Result Value Ref Range Status   MRSA, PCR NEGATIVE NEGATIVE Final   Staphylococcus aureus NEGATIVE NEGATIVE Final    Comment:        The Xpert SA Assay (FDA approved for NASAL specimens in patients over 76 years of age), is one component of a comprehensive surveillance program.  Test performance has been validated by [facility] for patients greater than or equal to 76 year old. It is not intended to diagnose infection nor to guide or monitor treatment.  Radiology Studies: No results found.      Scheduled Meds: . amLODipine  5 mg Oral Daily  . Chlorhexidine Gluconate Cloth  6 each Topical Once  . cycloSPORINE  1 drop Both Eyes BID  . heparin  5,000 Units Subcutaneous Q8H  . insulin aspart  0-9 Units Subcutaneous Q4H  . levothyroxine  100 mcg Intravenous Daily  . sodium chloride flush  3 mL Intravenous Q12H   Continuous Infusions: . dextrose 5% lactated ringers 10 mL/hr at 09/08/16 1927  . piperacillin-tazobactam (ZOSYN)  IV 3.375 g (09/10/16 1410)  . sodium chloride       LOS: 6 days    Time spent:    Zannie Cove, MD [facility] Pager [phone]  If 7PM-7AM, please contact night-coverage [email or web address] Password [facility] 09/10/2016, 2:27 PM

## 2016-09-10 NOTE — Evaluation (Signed)
Physical Therapy Evaluation Patient Details Name: Bonnie Hinton MRN: [id number] DOB: 01/31/1941 Today's Date: 09/10/2016   History of Present Illness  Bonnie Hinton is a 76 y.o. female with medical history significant for hypertension, hyperlipidemia diabetes, hypothyroidism, cholelithiasis, GERD, chronic back pain presents to emergency department chief complaint persistent worsening abdominal pain. Initial evaluation reveals acute kidney injury, leukocytosis, hypokalemia, acute gallstone pancreatitis.  pt s/p Lap Chole.  Clinical Impression  Pt admitted with/for abdominal pain, s/p Lap Chole.  Pt unable to tolerate the pain at this time and was maximal assist to EOB and back.  Pt sat EOB over 25 min rocking and moaning, but not entertaining thoughts of trying further.  Pt currently limited functionally due to the problems listed. ( See problems list.)   Pt will benefit from PT to maximize function and safety in order to get ready for next venue listed below.     Follow Up Recommendations Home health PT;Supervision/Assistance - 24 hour    Equipment Recommendations  None recommended by PT    Recommendations for Other Services       Precautions / Restrictions Precautions Precautions: Fall Restrictions Weight Bearing Restrictions: No      Mobility  Bed Mobility Overal bed mobility: Needs Assistance Bed Mobility: Supine to Sit;Sit to Supine     Supine to sit:  Sit to supine: Max assist;+2 for physical assistance   General bed mobility comments: pt moving at her own pace Needing assist forward.  Taking an extreme amount of time.  Transfers                 General transfer comment: pt unable to progress further than EOB  Ambulation/Gait                Stairs            Wheelchair Mobility    Modified Rankin (Stroke Patients Only)       Balance Overall balance assessment: Needs assistance     Sitting balance - Comments: no UE needed.   pt sat EOB x25 min moaning, but not letting anything else happen.       Standing balance comment: pt would never try                             Pertinent Vitals/Pain Pain Assessment: Faces Pain Score: 9  Pain Location: all over--abdomen and feet. Pain Descriptors / Indicators: Aching;Moaning Pain Intervention(s): Monitored during session;Limited activity within patient's tolerance    Home Living Family/patient expects to be discharged to:: Private residence Living Arrangements: Alone Available Help at Discharge: Family;Available 24 hours/day Type of Home: House Home Access: Level entry     Home Layout: One level Home Equipment: Walker - 2 wheels;Walker - 4 wheels;Bedside commode;Shower seat;Grab bars - toilet;Grab bars - tub/shower      Prior Function Level of Independence: Independent               Hand Dominance        Extremity/Trunk Assessment        Lower Extremity Assessment Lower Extremity Assessment: RLE deficits/detail;LLE deficits/detail RLE Deficits / Details: painful and unable to MMT LLE Deficits / Details: Painful and unable to MMT       Communication   Communication: No difficulties  Cognition Arousal/Alertness: Awake/alert Behavior During Therapy: WFL for tasks assessed/performed Overall Cognitive Status: No family/caregiver present to determine baseline cognitive functioning  General Comments      Exercises     Assessment/Plan    PT Assessment Patient needs continued PT services  PT Problem List Decreased strength;Decreased activity tolerance;Decreased mobility;Pain       PT Treatment Interventions Gait training;Functional mobility training;Therapeutic activities;Patient/family education;Therapeutic exercise;DME instruction    PT Goals (Current goals can be found in the Care Plan section)  Acute Rehab PT Goals Patient Stated Goal: pt unable to focus through  pain enough to participate PT Goal Formulation: With patient Time For Goal Achievement: 09/24/16 Potential to Achieve Goals: Fair    Frequency Min 3X/week   Barriers to discharge        Co-evaluation               AM-PAC PT "6 Clicks" Daily Activity  Outcome Measure Difficulty turning over in bed (including adjusting bedclothes, sheets and blankets)?: Total Difficulty moving from lying on back to sitting on the side of the bed? : Total Difficulty sitting down on and standing up from a chair with arms (e.g., wheelchair, bedside commode, etc,.)?: Total Help needed moving to and from a bed to chair (including a wheelchair)?: A Lot Help needed walking in hospital room?: A Lot Help needed climbing 3-5 steps with a railing? : A Lot 6 Click Score: 9    End of Session   Activity Tolerance: Patient limited by pain Patient left: in bed;with call bell/phone within reach Nurse Communication: Mobility status PT Visit Diagnosis: Other abnormalities of gait and mobility (R26.89);Difficulty in walking, not elsewhere classified (R26.2) Pain - part of body:  (abdomen, legs)    Time: [id number] PT Time Calculation (min) (ACUTE ONLY): 59 min   Charges:   PT Evaluation $PT Eval Moderate Complexity: 1 Procedure PT Treatments $Therapeutic Activity: 38-52 mins   PT G Codes:        09/10/2016  [place] BingKen Javad Salva, PT [phone]  (pager)  Eliseo GumKenneth V Jazz Rogala 09/10/2016, 4:36 PM

## 2016-09-11 LAB — COMPREHENSIVE METABOLIC PANEL
ALT: 47 U/L (ref 14–54)
AST: 53 U/L — ABNORMAL HIGH (ref 15–41)
Albumin: 2 g/dL — ABNORMAL LOW (ref 3.5–5.0)
Alkaline Phosphatase: 101 U/L (ref 38–126)
Anion gap: 9 (ref 5–15)
BUN: 11 mg/dL (ref 6–20)
CO2: 25 mmol/L (ref 22–32)
Calcium: 7.9 mg/dL — ABNORMAL LOW (ref 8.9–10.3)
Chloride: 100 mmol/L — ABNORMAL LOW (ref 101–111)
Creatinine, Ser: 1.23 mg/dL — ABNORMAL HIGH (ref 0.44–1.00)
GFR calc Af Amer: 48 mL/min — ABNORMAL LOW (ref >60)
GFR calc non Af Amer: 42 mL/min — ABNORMAL LOW (ref >60)
Glucose, Bld: 130 mg/dL — ABNORMAL HIGH (ref 65–99)
Potassium: 3.4 mmol/L — ABNORMAL LOW (ref 3.5–5.1)
Sodium: 134 mmol/L — ABNORMAL LOW (ref 135–145)
Total Bilirubin: 0.7 mg/dL (ref 0.3–1.2)
Total Protein: 6.4 g/dL — ABNORMAL LOW (ref 6.5–8.1)

## 2016-09-11 LAB — CBC
HCT: 35.1 % — ABNORMAL LOW (ref 36.0–46.0)
Hemoglobin: 11.2 g/dL — ABNORMAL LOW (ref 12.0–15.0)
MCH: 29.2 pg (ref 26.0–34.0)
MCHC: 31.9 g/dL (ref 30.0–36.0)
MCV: 91.4 fL (ref 78.0–100.0)
Platelets: 284 10*3/uL (ref 150–400)
RBC: 3.84 MIL/uL — ABNORMAL LOW (ref 3.87–5.11)
RDW: 14.4 % (ref 11.5–15.5)
WBC: 11.8 10*3/uL — ABNORMAL HIGH (ref 4.0–10.5)

## 2016-09-11 LAB — GLUCOSE, CAPILLARY
Glucose-Capillary: 128 mg/dL — ABNORMAL HIGH (ref 65–99)
Glucose-Capillary: 138 mg/dL — ABNORMAL HIGH (ref 65–99)
Glucose-Capillary: 145 mg/dL — ABNORMAL HIGH (ref 65–99)
Glucose-Capillary: 148 mg/dL — ABNORMAL HIGH (ref 65–99)
Glucose-Capillary: 155 mg/dL — ABNORMAL HIGH (ref 65–99)
Glucose-Capillary: 182 mg/dL — ABNORMAL HIGH (ref 65–99)

## 2016-09-11 NOTE — Progress Notes (Signed)
Physical Therapy Treatment Patient Details Name: Bonnie Hinton MRN: [id number] DOB: 05/29/1940 Today's Date: 09/11/2016    History of Present Illness Bonnie Hinton is a 76 y.o. female with medical history significant for hypertension, hyperlipidemia diabetes, hypothyroidism, cholelithiasis, GERD, chronic back pain presents to emergency department chief complaint persistent worsening abdominal pain. Initial evaluation reveals acute kidney injury, leukocytosis, hypokalemia, acute gallstone pancreatitis.  pt s/p Lap Chole.    PT Comments    While transfering back to bed, emphasis on scooting, transfer technique, working to attain upright stance and scooting back in bed.  Pt needed both truncal and LE assist of 2 persons this afternoon.    Follow Up Recommendations  SNF;Supervision/Assistance - 24 hour     Equipment Recommendations  None recommended by PT    Recommendations for Other Services       Precautions / Restrictions Precautions Precautions: Fall    Mobility  Bed Mobility Overal bed mobility: Needs Assistance Bed Mobility: Sit to Supine       Sit to supine: Max assist;+2 for physical assistance   General bed mobility comments: pt able to scoot posteriorly with min guard, able to go do on her R elbow, but unable to progress further without 2 person assist.  Transfers Overall transfer level: Needs assistance   Transfers: Sit to/from [employer] Transfers Sit to Stand: Max assist;+2 physical assistance   Squat pivot transfers: Max assist;+2 safety/equipment     General transfer comment: pt unable to attain full upright stance in the RW and therefore unable to pivot.  Changed to squat pivot and with maximal encouragement and assist got to the bed.  Ambulation/Gait             General Gait Details: unable   Stairs            Wheelchair Mobility    Modified Rankin (Stroke Patients Only)       Balance     Sitting  balance-Leahy Scale: Fair       Standing balance-Leahy Scale: Poor                              Cognition Arousal/Alertness: Awake/alert Behavior During Therapy: WFL for tasks assessed/performed Overall Cognitive Status: Within Functional Limits for tasks assessed                                        Exercises      General Comments        Pertinent Vitals/Pain Pain Assessment: 0-10 Pain Score: 9  Pain Descriptors / Indicators: Aching;Moaning Pain Intervention(s): Monitored during session;Limited activity within patient's tolerance    Home Living                      Prior Function            PT Goals (current goals can now be found in the care plan section) Acute Rehab PT Goals Patient Stated Goal: want to get home eventually PT Goal Formulation: With patient Time For Goal Achievement: 09/24/16 Potential to Achieve Goals: Fair Progress towards PT goals: Progressing toward goals    Frequency    Min 3X/week      PT Plan Current plan remains appropriate    Co-evaluation              AM-PAC PT "  6 Clicks" Daily Activity  Outcome Measure  Difficulty turning over in bed (including adjusting bedclothes, sheets and blankets)?: Total Difficulty moving from lying on back to sitting on the side of the bed? : Total Difficulty sitting down on and standing up from a chair with arms (e.g., wheelchair, bedside commode, etc,.)?: Total Help needed moving to and from a bed to chair (including a wheelchair)?: A Lot Help needed walking in hospital room?: A Lot Help needed climbing 3-5 steps with a railing? : Total 6 Click Score: 8    End of Session   Activity Tolerance: Other (comment);Patient limited by pain Patient left: in bed;with call bell/phone within reach;with bed alarm set Nurse Communication: Mobility status PT Visit Diagnosis: Muscle weakness (generalized) (M62.81);Difficulty in walking, not elsewhere classified  (R26.2);Pain Pain - part of body:  (abdoment and legs/feet)     Time: [id number] PT Time Calculation (min) (ACUTE ONLY): 26 min  Charges:  $Therapeutic Activity: 23-37 mins                    G Codes:       09/11/2016  [place] BingKen Journee Bobrowski, PT [phone]  (pager)   Bonnie GumKenneth V Daiden Coltrane 09/11/2016, 4:30 PM

## 2016-09-11 NOTE — Progress Notes (Signed)
Physical Therapy Treatment Patient Details Name: Bonnie Hinton MRN: [id number] DOB: 1940-04-23 Today's Date: 09/11/2016    History of Present Illness Bonnie Hinton is a 76 y.o. female with medical history significant for hypertension, hyperlipidemia diabetes, hypothyroidism, cholelithiasis, GERD, chronic back pain presents to emergency department chief complaint persistent worsening abdominal pain. Initial evaluation reveals acute kidney injury, leukocytosis, hypokalemia, acute gallstone pancreatitis.  pt s/p Lap Chole.    PT Comments    Pt able to focus on task through the pain today.  She has been sedentary and often using a lift chair when needed so she has lost conditioning and strength.  Furthermore she is fearful of falling and will progress slower than originally expected.  Will switch to SNF for rehab.    Follow Up Recommendations  SNF;Supervision/Assistance - 24 hour     Equipment Recommendations  None recommended by PT    Recommendations for Other Services       Precautions / Restrictions Precautions Precautions: Fall    Mobility  Bed Mobility Overal bed mobility: Needs Assistance Bed Mobility: Supine to Sit     Supine to sit: Mod assist     General bed mobility comments: scooting to EOB with mod, scooting back in chair with min.  Transfers Overall transfer level: Needs assistance   Transfers: Sit to/from Stand;Stand Pivot Transfers Sit to Stand: Mod assist Stand pivot transfers: Mod assist (+2 would be helpful for safety)       General transfer comment: cues for hand placement  assist to come forward and boost.  pt with poor use of the RW, down on elbows and unable to get back up.  Pt took shuffled pivot steps with no real clearance from the floor and unable to back up.  Ambulation/Gait             General Gait Details: not ready.  fear of falling playing a part.   Stairs            Wheelchair Mobility    Modified Rankin  (Stroke Patients Only)       Balance Overall balance assessment: Needs assistance   Sitting balance-Leahy Scale: Fair Sitting balance - Comments: no UE needed, but if pt got too close to EOB, whe would start yelling in fear of falling.   Standing balance support: Bilateral upper extremity supported Standing balance-Leahy Scale: Poor Standing balance comment: heavy use of RW                            Cognition Arousal/Alertness: Awake/alert Behavior During Therapy: WFL for tasks assessed/performed Overall Cognitive Status: Within Functional Limits for tasks assessed (improved today, but maybe not to baseline)                                        Exercises      General Comments        Pertinent Vitals/Pain Pain Assessment: 0-10 Pain Score: 9  Pain Location: all over--abdomen and feet. Pain Descriptors / Indicators: Aching;Moaning Pain Intervention(s): Monitored during session;Premedicated before session;Limited activity within patient's tolerance    Home Living                      Prior Function            PT Goals (current goals can now be found in  the care plan section) Acute Rehab PT Goals Patient Stated Goal: want to get home eventually PT Goal Formulation: With patient Time For Goal Achievement: 09/24/16 Potential to Achieve Goals: Fair Progress towards PT goals: Progressing toward goals    Frequency    Min 3X/week      PT Plan Discharge plan needs to be updated    Co-evaluation              AM-PAC PT "6 Clicks" Daily Activity  Outcome Measure  Difficulty turning over in bed (including adjusting bedclothes, sheets and blankets)?: Total Difficulty moving from lying on back to sitting on the side of the bed? : Total Difficulty sitting down on and standing up from a chair with arms (e.g., wheelchair, bedside commode, etc,.)?: Total Help needed moving to and from a bed to chair (including a wheelchair)?:  A Lot Help needed walking in hospital room?: A Lot Help needed climbing 3-5 steps with a railing? : Total 6 Click Score: 8    End of Session   Activity Tolerance: No increased pain;Other (comment) (fear and weakness) Patient left: in chair;with call bell/phone within reach;with chair alarm set Nurse Communication: Mobility status PT Visit Diagnosis: Muscle weakness (generalized) (M62.81);Difficulty in walking, not elsewhere classified (R26.2);Pain Pain - part of body:  (abdomen legs and feet.)     Time: 1035-1110 PT Time Calculation (min) (ACUTE ONLY): 35 min  Charges:  $Therapeutic Activity: 23-37 mins                    G Codes:       09/14/16  [place], PT [phone]  (pager)   Bonnie Hinton 09/14/16, 11:25 AM

## 2016-09-11 NOTE — Care Management Important Message (Signed)
Important Message  Patient Details  Name: Bonnie Hinton MRN: [id number] Date of Birth: 02-17-1941   Medicare Important Message Given:  Yes    Kyla BalzarineShealy, Rozena Fierro [name] 09/11/2016, 10:22 AM

## 2016-09-11 NOTE — Progress Notes (Signed)
Pharmacy Antibiotic Note  Bonnie Hinton is a 76 y.o. female admitted on 09/04/2016 with gallstone pancreatitis, possible cholecystitis.  Pharmacy was consulted on 09/08/16  for Zosyn dosing.  Zosyn continues on day #7. S/P laparoscopic cholecystectomy, Dr. Corliss Skainssuei, 09/09/16 WBC decreased then back up, now trending down  9.3>14.7>11.8 and  Afebrile, Tm 99.3 on 6/19.  Improving SCr trended down to 1.23   Plan: Continue Zosyn 3.375g IV q8h (4 hour infusion).  Follow renal function, surgical plans, any cultures  Height: 5\' 5"  (165.1 cm) Weight: (!) 310 lb 8 oz (140.8 kg) IBW/kg (Calculated) : 57  Temp (24hrs), Avg:98.6 F (37 C), Min:98.2 F (36.8 C), Max:98.8 F (37.1 C)   Recent Labs Lab 09/06/16 0504 09/07/16 0630 09/08/16 0738 09/10/16 0814 09/11/16 0723  WBC 9.9 10.0 9.3 14.7* 11.8*  CREATININE 1.66* 1.26* 1.16* 1.24* 1.23*    Estimated Creatinine Clearance: 56.5 mL/min (A) (by C-G formula based on SCr of 1.23 mg/dL (H)).    Allergies  Allergen Reactions  . Erythromycin Rash  . Iodinated Diagnostic Agents Swelling and Rash  . Fentanyl Other (See Comments)    Duragesic-25  . Latex Itching and Rash  . Lisinopril Other (See Comments)    Hyperkalemia   . Other Rash    Paper tape, possibly adhesive tape    Antimicrobials this admission: Ceftriaxone 6/14 >> 6/16 Zosyn 6/17 >>   Dose adjustments this admission: N/a  Microbiology results: 6/13 MRSA PCR: neg 6/17 MRSA PCR: neg GI panel PCR, stool: canceled  Thank you for allowing pharmacy to be a part of this patient's care. Noah Delaineuth Geneva Pallas, RPh Clinical Pharmacist Pager: [phone] Main Pharmacy [phone] 09/11/2016 3:35 PM

## 2016-09-11 NOTE — Progress Notes (Signed)
[facility]/Trauma Progress Note  2 Days Post-Op   Subjective:  CC: RUQ pain  Pt states her pain is improved. She is tolerating clears. No BM or flatus. She denies fever or chills. No acute events overnight.   Objective: Vital signs in last 24 hours: Temp:  [98.2 F (36.8 C)-99.3 F (37.4 C)] 98.2 F (36.8 C) (06/20 0534) Pulse Rate:  [83-96] 83 (06/20 0534) Resp:  [17-18] 18 (06/20 0534) BP: (124-164)/(63-71) 124/65 (06/20 0911) SpO2:  [98 %-99 %] 98 % (06/20 0534) Weight:  [310 lb 8 oz (140.8 kg)] 310 lb 8 oz (140.8 kg) (06/20 0534) Last BM Date: 09/04/16  Intake/Output from previous day: 06/19 0701 - 06/20 0700 In: 1083 [P.O.:1080; I.V.:3] Out: 550 [Urine:550] Intake/Output this shift: No intake/output data recorded.  PE: Gen:  Alert, NAD, cooperative Card:  RRR, no M/G/R heard Pulm:  Rate and effort normal Abd: Soft, obese, not distended, + BS, incisions C/D/I, mild TTP in the right sided abdomen Skin: warm and dry  Lab Results:   Recent Labs  09/10/16 0814 09/11/16 0723  WBC 14.7* 11.8*  HGB 12.9 11.2*  HCT 40.2 35.1*  PLT 220 284   BMET  Recent Labs  09/10/16 0814 09/11/16 0723  NA 138 134*  K 3.6 3.4*  CL 103 100*  CO2 23 25  GLUCOSE 135* 130*  BUN 14 11  CREATININE 1.24* 1.23*  CALCIUM 8.2* 7.9*   PT/INR No results for input(s): LABPROT, INR in the last 72 hours. CMP     Component Value Date/Time   NA 134 (L) 09/11/2016 0723   NA 139 01/24/2016   K 3.4 (L) 09/11/2016 0723   CL 100 (L) 09/11/2016 0723   CO2 25 09/11/2016 0723   GLUCOSE 130 (H) 09/11/2016 0723   BUN 11 09/11/2016 0723   BUN 24 (A) 01/24/2016   CREATININE 1.23 (H) 09/11/2016 0723   CALCIUM 7.9 (L) 09/11/2016 0723   PROT 6.4 (L) 09/11/2016 0723   ALBUMIN 2.0 (L) 09/11/2016 0723   AST 53 (H) 09/11/2016 0723   ALT 47 09/11/2016 0723   ALKPHOS 101 09/11/2016 0723   BILITOT 0.7 09/11/2016 0723   GFRNONAA 42 (L) 09/11/2016 0723   GFRAA 48 (L)  09/11/2016 0723   Lipase     Component Value Date/Time   LIPASE 56 (H) 09/07/2016 0630    Studies/Results: No results found.  Anti-infectives: Anti-infectives    Start     Dose/Rate Route Frequency Ordered Stop   09/09/16 1059  ceFAZolin (ANCEF) 1-4 GM/50ML-% IVPB    Comments:  Beckner, Zachary   : cabinet override      09/09/16 1059 09/09/16 2314   09/09/16 1059  ceFAZolin (ANCEF) 2-4 GM/100ML-% IVPB    Comments:  Yvonne Kendall   : cabinet override      09/09/16 1059 09/09/16 2314   09/08/16 1200  piperacillin-tazobactam (ZOSYN) IVPB 3.375 g     3.375 g 12.5 mL/hr over 240 Minutes Intravenous Every 8 hours 09/08/16 1106     09/05/16 1630  cefTRIAXone (ROCEPHIN) 2 g in dextrose 5 % 50 mL IVPB  Status:  Discontinued    Comments:  Pharmacy may adjust dosing strength / duration / interval for maximal efficacy   2 g 100 mL/hr over 30 Minutes Intravenous Every 24 hours 09/05/16 1520 09/08/16 1047   09/04/16 1330  cefTRIAXone (ROCEPHIN) 2 g in dextrose 5 % 50 mL IVPB  Status:  Discontinued     2 g 100 mL/hr over  30 Minutes Intravenous Every 24 hours 09/04/16 1328 09/04/16 1535   09/04/16 1115  piperacillin-tazobactam (ZOSYN) IVPB 3.375 g  Status:  Discontinued     3.375 g 100 mL/hr over 30 Minutes Intravenous  Once 09/04/16 1114 09/04/16 1327       Assessment/Plan AKI DM type II HTN Hypothyroidism Obesity  Gallstone pancreatitis, acute cholecystits S/P laparoscopic cholecystectomy, Dr. Corliss Skainssuei, 09/09/16  FEN: soft diet  VTE: heparin  ID: Zosyn 6/13; 6/18>>   Rocephin 6/13>6/17  DISPO: Pt needs to ambulate. LFT's trending down, Tbili normal.    LOS: 7 days    Jerre SimonJessica L Focht , [facility] 09/11/2016, 9:20 AM Pager: [phone] Consults: [phone] Mon-Fri 7:00 am-4:30 pm Sat-Sun 7:00 am-11:30 am

## 2016-09-11 NOTE — Progress Notes (Signed)
PROGRESS NOTE    Bonnie Hinton  ZOX:[id number] DOB: 05-03-1940 DOA: 09/04/2016 PCP: Sherlene Shamsullo, Teresa L, MD  Brief Narrative:  Bonnie Gashlizabeth A Lyter is a 76 y.o. female with medical history significant for hypertension, hyperlipidemia diabetes, hypothyroidism, cholelithiasis, GERD, chronic back pain presented to emergency department with worsening abdominal pain. Noted to have acute kidney injury, leukocytosis, hypokalemia, acute gallstone pancreatitis. -improving slowly, pancreatitis treated with supportive care, MRCP showed no CBD obstruction, seen by GI, ERCP not felt to be indicated, LFTs/Bili improved, finally underwent Lap Chole postop day #2 CCS following, PT consulted and recommending SNF  Assessment & Plan:   Pancreatitis, gallstone/Cholecystitis -continues to improve -suspect passed stone, LFTS/Bili/lipase much better -Gi concerned that Victoza could have contributed to pancreatitis  -MRCP without CBD stone/obstruction, cholelithiases noted -HIDA scan with cholecystitis, started Zosyn -CCS following, s/p Lap Chole 6/18 -Pt eval and recommended SNF. Discussed with daughter  AKI -Suspected due to pancreatitis, ? ATN versus poor oral intake -improving, creatinine down to 1.3 from 2.5 -baseline normal -no hydronephrosis noted on MRI   Diabetes mellitus type 2, controlled (HCC) - holding victoza, SSI now, blood sugars relatively well controlled on this regimen   Hypertension - stable resume norvasc   Hypothyroidism.  - stable continue synthroid 100 mcg IV, once taking oral will switch over to po regimen.   Obesity/Hx fatty liver  DVT prophylaxis:Hep SQ Code Status: Full Code Family Communication:  called and d/w daughter Clydene PughMary Slavens  Disposition Plan: Home pending improvement, tolerating diet etc, may need SNF-PT eval pending too  Consultants: GI , CCS  Subjective: Pt has no new complaints.   Objective: Vitals:   09/10/16 2126 09/11/16 0534 09/11/16 0911  09/11/16 1418  BP: (!) 148/64 (!) 143/63 124/65 (!) 129/58  Pulse: 96 83  83  Resp: 18 18  18   Temp: 98.8 F (37.1 C) 98.2 F (36.8 C)  98.7 F (37.1 C)  TempSrc: Oral   Oral  SpO2: 98% 98%  98%  Weight:  (!) 140.8 kg (310 lb 8 oz)    Height:        Intake/Output Summary (Last 24 hours) at 09/11/16 1909 Last data filed at 09/11/16 0930  Gross per 24 hour  Intake              243 ml  Output                0 ml  Net              243 ml   Filed Weights   09/09/16 0724 09/10/16 0500 09/11/16 0534  Weight: (!) 143.5 kg (316 lb 6.4 oz) (!) 140.9 kg (310 lb 11.2 oz) (!) 140.8 kg (310 lb 8 oz)    Examination:  Gen: Awake, Alert, Oriented X 3, obese, no distress HEENT: PERRLA, Neck supple, no JVD Lungs: decreased BS at bases, improved air movement otherwise, no wheezes CVS: RRR,No Gallops,Rubs or new Murmurs Abd: soft, no guarding,  distended Extremities: trace edema, + pulses Skin: no new rashes, warm and dry   Data Reviewed:   CBC:  Recent Labs Lab 09/06/16 0504 09/07/16 0630 09/08/16 0738 09/10/16 0814 09/11/16 0723  WBC 9.9 10.0 9.3 14.7* 11.8*  HGB 11.9* 11.8* 12.9 12.9 11.2*  HCT 37.9 37.3 40.4 40.2 35.1*  MCV 92.0 91.2 91.2 91.6 91.4  PLT 183 200 206 220 284   Basic Metabolic Panel:  Recent Labs Lab 09/06/16 0504 09/07/16 0630 09/08/16 0738 09/10/16 0814 09/11/16 0723  NA  137 138 139 138 134*  K 4.4 3.9 4.0 3.6 3.4*  CL 103 103 104 103 100*  CO2 24 24 24 23 25   GLUCOSE 175* 114* 104* 135* 130*  BUN 33* 20 17 14 11   CREATININE 1.66* 1.26* 1.16* 1.24* 1.23*  CALCIUM 7.8* 8.0* 8.6* 8.2* 7.9*   GFR: Estimated Creatinine Clearance: 56.5 mL/min (A) (by C-G formula based on SCr of 1.23 mg/dL (H)). Liver Function Tests:  Recent Labs Lab 09/06/16 0504 09/07/16 0630 09/08/16 0738 09/10/16 0814 09/11/16 0723  AST 77* 31 23 58* 53*  ALT 298* 193* 127* 58* 47  ALKPHOS 145* 145* 125 115 101  BILITOT 1.2 0.7 0.7 0.8 0.7  PROT 6.4* 6.4* 6.7 6.7  6.4*  ALBUMIN 2.5* 2.3* 2.4* 2.3* 2.0*    Recent Labs Lab 09/05/16 0446 09/06/16 0504 09/07/16 0630  LIPASE 148* 69* 56*   No results for input(s): AMMONIA in the last 168 hours. Coagulation Profile: No results for input(s): INR, PROTIME in the last 168 hours. Cardiac Enzymes: No results for input(s): CKTOTAL, CKMB, CKMBINDEX, TROPONINI in the last 168 hours. BNP (last 3 results) No results for input(s): PROBNP in the last 8760 hours. HbA1C: No results for input(s): HGBA1C in the last 72 hours. CBG:  Recent Labs Lab 09/11/16 0020 09/11/16 0454 09/11/16 0822 09/11/16 1217 09/11/16 1605  GLUCAP 145* 155* 128* 182* 138*   Lipid Profile: No results for input(s): CHOL, HDL, LDLCALC, TRIG, CHOLHDL, LDLDIRECT in the last 72 hours. Thyroid Function Tests: No results for input(s): TSH, T4TOTAL, FREET4, T3FREE, THYROIDAB in the last 72 hours. Anemia Panel: No results for input(s): VITAMINB12, FOLATE, FERRITIN, TIBC, IRON, RETICCTPCT in the last 72 hours. Urine analysis:    Component Value Date/Time   COLORURINE AMBER (A) 09/04/2016 1616   APPEARANCEUR CLOUDY (A) 09/04/2016 1616   LABSPEC 1.020 09/04/2016 1616   PHURINE 5.0 09/04/2016 1616   GLUCOSEU 50 (A) 09/04/2016 1616   GLUCOSEU NEGATIVE 04/25/2015 1359   HGBUR MODERATE (A) 09/04/2016 1616   BILIRUBINUR SMALL (A) 09/04/2016 1616   BILIRUBINUR neg 04/25/2015 1450   KETONESUR NEGATIVE 09/04/2016 1616   PROTEINUR 100 (A) 09/04/2016 1616   UROBILINOGEN 0.2 04/25/2015 1450   UROBILINOGEN 0.2 04/25/2015 1359   NITRITE NEGATIVE 09/04/2016 1616   LEUKOCYTESUR TRACE (A) 09/04/2016 1616   Sepsis Labs: @LABRCNTIP (procalcitonin:4,lacticidven:4)  ) Recent Results (from the past 240 hour(s))  MRSA PCR Screening     Status: None   Collection Time: 09/04/16  4:53 PM  Result Value Ref Range Status   MRSA by PCR NEGATIVE NEGATIVE Final    Comment:        The GeneXpert MRSA Assay (FDA approved for NASAL specimens only), is  one component of a comprehensive MRSA colonization surveillance program. It is not intended to diagnose MRSA infection nor to guide or monitor treatment for MRSA infections.   Surgical pcr screen     Status: None   Collection Time: 09/08/16  2:33 PM  Result Value Ref Range Status   MRSA, PCR NEGATIVE NEGATIVE Final   Staphylococcus aureus NEGATIVE NEGATIVE Final    Comment:        The Xpert SA Assay (FDA approved for NASAL specimens in patients over 86 years of age), is one component of a comprehensive surveillance program.  Test performance has been validated by [facility] for patients greater than or equal to 61 year old. It is not intended to diagnose infection nor to guide or monitor treatment.  Radiology Studies: No results found.   Scheduled Meds: . amLODipine  5 mg Oral Daily  . Chlorhexidine Gluconate Cloth  6 each Topical Once  . cycloSPORINE  1 drop Both Eyes BID  . heparin  5,000 Units Subcutaneous Q8H  . insulin aspart  0-9 Units Subcutaneous Q4H  . levothyroxine  100 mcg Intravenous Daily  . sodium chloride flush  3 mL Intravenous Q12H   Continuous Infusions: . dextrose 5% lactated ringers 10 mL/hr at 09/08/16 1927  . piperacillin-tazobactam (ZOSYN)  IV 3.375 g (09/11/16 1420)  . sodium chloride       LOS: 7 days    Time spent: > 30 min  Penny Pia  [facility] Pager [phone]  If 7PM-7AM, please contact night-coverage [email or web address] Password [email or web address] 09/11/2016, 7:09 PM

## 2016-09-11 NOTE — Clinical Social Work Note (Signed)
Clinical Social Work Assessment  Patient Details  Name: Bonnie Hinton MRN: [id number] Date of Birth: 17-Aug-1940  Date of referral:  09/11/16               Reason for consult:  Facility Placement                Permission sought to share information with:  Facility Medical sales representativeContact Representative, Family Supports Permission granted to share information::  Yes, Verbal Permission Granted  Name::        Agency::  SNFs  Relationship::     Contact Information:     Housing/Transportation Living arrangements for the past 2 months:  Single Family Home Source of Information:  Patient Patient Interpreter Needed:  None Criminal Activity/Legal Involvement Pertinent to Current Situation/Hospitalization:  No - Comment as needed Significant Relationships:  Adult Children Lives with:  Self Do you feel safe going back to the place where you live?  No Need for family participation in patient care:  No (Coment)  Care giving concerns:  CSW received consult for possible SNF placement at time of discharge. CSW spoke with patient regarding PT recommendation of SNF placement at time of discharge. Patient reported that she is currently unable to care for herself at home given patient's current physical needs and fall risk. Patient expressed understanding of PT recommendation and is agreeable to SNF placement at time of discharge. CSW to continue to follow and assist with discharge planning needs.   Social Worker assessment / plan:  CSW spoke with patient concerning [facility] before [facility].  Employment status:  Retired Database administratornsurance information:  Managed Medicare PT Recommendations:  Skilled Nursing Facility Information / Referral to community resources:  Skilled Nursing Facility  Patient/Family's Response to care:  Patient recognizes need for rehab before returning home and [place] [facility] since she has been there  before and it is near her daughter's house.   Patient/Family's Understanding of and Emotional Response to Diagnosis, Current Treatment, and Prognosis:  Patient/family is realistic regarding therapy needs and expressed being hopeful for SNF placement. Patient expressed understanding of CSW role and discharge process as well as her medical needs. No questions/concerns about plan or treatment.    Emotional Assessment Appearance:  Appears stated age Attitude/Demeanor/Rapport:  Other (Appropriate) Affect (typically observed):  Accepting, Appropriate Orientation:  Oriented to Self, Oriented to Situation, Oriented to Place, Oriented to  Time Alcohol / Substance use:  Not Applicable Psych involvement (Current and /or in the community):  No (Comment)  Discharge Needs  Concerns to be addressed:  Care Coordination Readmission within the last 30 days:  No Current discharge risk:  None Barriers to Discharge:  Continued Medical Work up   [employer] S Mayfield Schoene, LCSWA 09/11/2016, 5:11 PM

## 2016-09-11 NOTE — [place] FL2 (Signed)
[place] MEDICAID FL2 LEVEL OF CARE SCREENING TOOL     IDENTIFICATION  Patient Name: Bonnie Hinton Birthdate: [birth year] Sex: female Admission Date (Current Location): 09/04/2016  [facility] and [place] Number:  Producer, television/film/video and Address:  [place], [address] [place], [place] [address]      Provider Number: [id number]  Attending Physician Name and Address:  Penny Pia, MD  Relative Name and Phone Number:  Corrie Dandy, daughter, [phone]    Current Level of Care: Hospital Recommended Level of Care: Skilled Nursing Facility Prior Approval Number:    Date Approved/Denied:   PASRR Number: [id number]  Discharge Plan: SNF    Current Diagnoses: Patient Active Problem List   Diagnosis Date Noted  . Idiopathic acute pancreatitis without infection or necrosis   . Shock liver   . Acute kidney injury (HCC) 09/04/2016  . Diarrhea 09/04/2016  . Pancreatitis, gallstone   . Choledocholithiasis   . Gallstone pancreatitis   . Diabetes mellitus with complication (HCC)   . Cholelithiasis 06/29/2016  . Dizziness 05/24/2016  . Fatty liver 04/09/2016  . Anxiety, generalized 02/20/2016  . Leukocytosis 01/11/2016  . Arthritis   . Acquired hypothyroidism   . History of CVA (cerebrovascular accident)   . Status post bilateral hip replacements   . Status post left knee replacement   . Chronic low back pain with right-sided sciatica   . Essential hypertension, benign 07/25/2015  . Preoperative clearance 07/25/2015  . Hyperlipidemia 07/25/2015  . Urinary urgency 04/26/2015  . Diabetes mellitus type 2, controlled (HCC) 01/24/2015  . Morbid obesity (HCC) 01/24/2015  . Vitamin D deficiency 01/24/2015  . 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, right 09/23/2014    Orientation RESPIRATION BLADDER Height & Weight     Self, Time, Situation, Place  O2 (Nasal cannula 2L) Incontinent, External catheter Weight: (!) 140.8 kg (310 lb 8  oz) Height:  5\' 5"  (165.1 cm)  BEHAVIORAL SYMPTOMS/MOOD NEUROLOGICAL BOWEL NUTRITION STATUS      Continent Diet (Please see DC Summary)  AMBULATORY STATUS COMMUNICATION OF NEEDS Skin   Extensive Assist Verbally Surgical wounds (Closed incision and ports on abdomen)                       Personal Care Assistance Level of Assistance  Bathing, Feeding, Dressing Bathing Assistance: Maximum assistance Feeding assistance: Independent Dressing Assistance: Limited assistance     Functional Limitations Info  Sight Sight Info: Impaired        SPECIAL CARE FACTORS FREQUENCY  PT (By licensed PT)     PT Frequency: 5x/week              Contractures      Additional Factors Info  Code Status, Allergies Code Status Info: Full Allergies Info: Erythromycin, Iodinated Diagnostic Agents, Fentanyl, Latex, Lisinopril, Other           Current Medications (09/11/2016):  This is the current hospital active medication list Current Facility-Administered Medications  Medication Dose Route Frequency Provider Last Rate Last Dose  . acetaminophen (TYLENOL) tablet 650 mg  650 mg Oral Q6H PRN Gwenyth Bender, NP       Or  . acetaminophen (TYLENOL) suppository 650 mg  650 mg Rectal Q6H PRN Gwenyth Bender, NP      . amLODipine (NORVASC) tablet 5 mg  5 mg Oral Daily Zannie Cove, MD   5 mg at 09/11/16 0911  . Chlorhexidine Gluconate Cloth 2 %  PADS 6 each  6 each Topical Once Zannie CoveJoseph, Preetha, MD      . cycloSPORINE (RESTASIS) 0.05 % ophthalmic emulsion 1 drop  1 drop Both Eyes BID Gwenyth BenderBlack, Karen M, NP   1 drop at 09/11/16 0911  . dextrose 5 % in lactated ringers infusion   Intravenous Continuous Zannie CoveJoseph, Preetha, MD 10 mL/hr at 09/08/16 1927    . heparin injection 5,000 Units  5,000 Units Subcutaneous Q8H Gwenyth BenderBlack, Karen M, NP   5,000 Units at 09/11/16 0602  . HYDROmorphone (DILAUDID) injection 1 mg  1 mg Intravenous Q2H PRN Manus Ruddsuei, Matthew, MD   1 mg at 09/11/16 1041  . insulin aspart (novoLOG)  injection 0-9 Units  0-9 Units Subcutaneous Q4H Gwenyth BenderBlack, Karen M, NP   2 Units at 09/11/16 1246  . levothyroxine (SYNTHROID, LEVOTHROID) injection 100 mcg  100 mcg Intravenous Daily Gwenyth BenderBlack, Karen M, NP   100 mcg at 09/11/16 0911  . morphine 2 MG/ML injection 2-4 mg  2-4 mg Intravenous Q2H PRN Manus Ruddsuei, Matthew, MD   2 mg at 09/10/16 1119  . ondansetron (ZOFRAN) tablet 4 mg  4 mg Oral Q6H PRN Gwenyth BenderBlack, Karen M, NP   4 mg at 09/10/16 2115  . oxyCODONE (Oxy IR/ROXICODONE) immediate release tablet 5 mg  5 mg Oral Q4H PRN Manus Ruddsuei, Matthew, MD      . piperacillin-tazobactam (ZOSYN) IVPB 3.375 g  3.375 g Intravenous Q8H Bajbus, Lauren D, RPH   Stopped at 09/11/16 1004  . sodium chloride 0.9 % bolus 1,000 mL  1,000 mL Intravenous Once Crista CurbLiu, Dana Duo, MD      . sodium chloride flush (NS) 0.9 % injection 3 mL  3 mL Intravenous Q12H Gwenyth BenderBlack, Karen M, NP   3 mL at 09/10/16 2111     Discharge Medications: Please see discharge summary for a list of discharge medications.  Relevant Imaging Results:  Relevant Lab Results:   Additional Information SSN:[id number]  Mearl Latinadia S Cristel Rail, LCSWA

## 2016-09-12 LAB — GLUCOSE, CAPILLARY
Glucose-Capillary: 123 mg/dL — ABNORMAL HIGH (ref 65–99)
Glucose-Capillary: 144 mg/dL — ABNORMAL HIGH (ref 65–99)
Glucose-Capillary: 148 mg/dL — ABNORMAL HIGH (ref 65–99)
Glucose-Capillary: 153 mg/dL — ABNORMAL HIGH (ref 65–99)
Glucose-Capillary: 159 mg/dL — ABNORMAL HIGH (ref 65–99)
Glucose-Capillary: 186 mg/dL — ABNORMAL HIGH (ref 65–99)
Glucose-Capillary: 186 mg/dL — ABNORMAL HIGH (ref 65–99)

## 2016-09-12 MED ORDER — LEVOTHYROXINE SODIUM 88 MCG PO TABS
225.0000 ug | ORAL_TABLET | Freq: Every day | ORAL | Status: DC
  Administered 2016-09-13 – 2016-09-14 (×2): 225 ug via ORAL
  Filled 2016-09-12 (×2): qty 1

## 2016-09-12 MED ORDER — HYDROMORPHONE HCL 1 MG/ML IJ SOLN
0.5000 mg | INTRAMUSCULAR | Status: DC | PRN
  Administered 2016-09-12 – 2016-09-13 (×5): 0.5 mg via INTRAVENOUS
  Filled 2016-09-12 (×5): qty 0.5

## 2016-09-12 NOTE — Progress Notes (Signed)
Pharmacy Antibiotic Note  Bonnie Hinton is a 76 y.o. female admitted on 09/04/2016 with gallstone pancreatitis, possible cholecystitis.  Pharmacy was consulted on 09/08/16  for Zosyn dosing. Today is D#8 of antibiotics. S/P laparoscopic cholecystectomy, Dr. Corliss Skainssuei, 09/09/16. Tmax is 100.6 and WBC is 11.8. SCr stable at 1.23. Dose appropriate.  Plan: Continue Zosyn 3.375g IV q8h (4 hour infusion).  Follow renal function, surgical plans, any cultures Consider DC zosyn *Pharmacy will sign off and only follow peripherally as no dose adjustments are anticipated. Thank you for the consult!   Height: 5\' 5"  (165.1 cm) Weight: (!) 310 lb 8 oz (140.8 kg) IBW/kg (Calculated) : 57  Temp (24hrs), Avg:98.9 F (37.2 C), Min:98.3 F (36.8 C), Max:100.6 F (38.1 C)   Recent Labs Lab 09/06/16 0504 09/07/16 0630 09/08/16 0738 09/10/16 0814 09/11/16 0723  WBC 9.9 10.0 9.3 14.7* 11.8*  CREATININE 1.66* 1.26* 1.16* 1.24* 1.23*    Estimated Creatinine Clearance: 56.5 mL/min (A) (by C-G formula based on SCr of 1.23 mg/dL (H)).    Allergies  Allergen Reactions  . Erythromycin Rash  . Iodinated Diagnostic Agents Swelling and Rash  . Fentanyl Other (See Comments)    Duragesic-25  . Latex Itching and Rash  . Lisinopril Other (See Comments)    Hyperkalemia   . Other Rash    Paper tape, possibly adhesive tape    Antimicrobials this admission: Ceftriaxone 6/14 >> 6/16 Zosyn 6/17 >>   Microbiology results: 6/13 MRSA PCR: neg 6/17 MRSA PCR: neg GI panel PCR, stool: canceled  Lysle Pearlachel Janalee Grobe, PharmD, BCPS Pager # [phone] 09/12/2016 10:51 AM

## 2016-09-12 NOTE — Progress Notes (Signed)
Pharmacy is asked to help converting IV synthroid to PO.  Pt is currently on 100 mcg IV synthroid, Per Med history, Pt takes 200 mcg together with 25 mcg daily. I spoke with pt, although she doesn't remember her home dose, but she said she does take 2 different strength of synthroid each day. TSH 1.461 on 6/13 this admission.  Plan: Change IV synthroid to po, continue home dose 225 mcg daily.  Thanks!   Bayard HuggerMei Keylah Darwish, PharmD, BCPS  Clinical Pharmacist  Pager: [phone]

## 2016-09-12 NOTE — Progress Notes (Signed)
[facility]/Trauma Progress Note  3 Days Post-Op   Subjective:  CC: abdominal soreness  Tolerating diet. No nausea or vomiting. No flatus. Minimal abdominal pain. States she is unable to walk cause her ankles hurt. Pt states she hopes to go to rehab to regain her strength.   Objective: Vital signs in last 24 hours: Temp:  [98.3 F (36.8 C)-100.6 F (38.1 C)] 98.5 F (36.9 C) (06/21 0437) Pulse Rate:  [83-87] 83 (06/21 0437) Resp:  [18] 18 (06/21 0437) BP: (119-156)/(50-64) 156/64 (06/21 0437) SpO2:  [98 %-99 %] 98 % (06/21 0437) Last BM Date: 09/04/16  Intake/Output from previous day: 06/20 0701 - 06/21 0700 In: 460 [P.O.:240; I.V.:120; IV Piggyback:100] Out: 900 [Urine:900] Intake/Output this shift: Total I/O In: 60 [P.O.:60] Out: -   PE: Gen: Alert, NAD, cooperative Card: RRR, no M/G/R heard Pulm: Rate andeffort normal Abd: Soft, obese, not distended, + BS, incisions C/D/I, no TTP Skin: warm and dry   Lab Results:   Recent Labs  09/10/16 0814 09/11/16 0723  WBC 14.7* 11.8*  HGB 12.9 11.2*  HCT 40.2 35.1*  PLT 220 284   BMET  Recent Labs  09/10/16 0814 09/11/16 0723  NA 138 134*  K 3.6 3.4*  CL 103 100*  CO2 23 25  GLUCOSE 135* 130*  BUN 14 11  CREATININE 1.24* 1.23*  CALCIUM 8.2* 7.9*   PT/INR No results for input(s): LABPROT, INR in the last 72 hours. CMP     Component Value Date/Time   NA 134 (L) 09/11/2016 0723   NA 139 01/24/2016   K 3.4 (L) 09/11/2016 0723   CL 100 (L) 09/11/2016 0723   CO2 25 09/11/2016 0723   GLUCOSE 130 (H) 09/11/2016 0723   BUN 11 09/11/2016 0723   BUN 24 (A) 01/24/2016   CREATININE 1.23 (H) 09/11/2016 0723   CALCIUM 7.9 (L) 09/11/2016 0723   PROT 6.4 (L) 09/11/2016 0723   ALBUMIN 2.0 (L) 09/11/2016 0723   AST 53 (H) 09/11/2016 0723   ALT 47 09/11/2016 0723   ALKPHOS 101 09/11/2016 0723   BILITOT 0.7 09/11/2016 0723   GFRNONAA 42 (L) 09/11/2016 0723   GFRAA 48 (L) 09/11/2016 0723    Lipase     Component Value Date/Time   LIPASE 56 (H) 09/07/2016 0630    Studies/Results: No results found.  Anti-infectives: Anti-infectives    Start     Dose/Rate Route Frequency Ordered Stop   09/09/16 1059  ceFAZolin (ANCEF) 1-4 GM/50ML-% IVPB    Comments:  Beckner, Zachary   : cabinet override      09/09/16 1059 09/09/16 2314   09/09/16 1059  ceFAZolin (ANCEF) 2-4 GM/100ML-% IVPB    Comments:  Yvonne Kendall   : cabinet override      09/09/16 1059 09/09/16 2314   09/08/16 1200  piperacillin-tazobactam (ZOSYN) IVPB 3.375 g     3.375 g 12.5 mL/hr over 240 Minutes Intravenous Every 8 hours 09/08/16 1106     09/05/16 1630  cefTRIAXone (ROCEPHIN) 2 g in dextrose 5 % 50 mL IVPB  Status:  Discontinued    Comments:  Pharmacy may adjust dosing strength / duration / interval for maximal efficacy   2 g 100 mL/hr over 30 Minutes Intravenous Every 24 hours 09/05/16 1520 09/08/16 1047   09/04/16 1330  cefTRIAXone (ROCEPHIN) 2 g in dextrose 5 % 50 mL IVPB  Status:  Discontinued     2 g 100 mL/hr over 30 Minutes Intravenous Every 24 hours 09/04/16 1328  09/04/16 1535   09/04/16 1115  piperacillin-tazobactam (ZOSYN) IVPB 3.375 g  Status:  Discontinued     3.375 g 100 mL/hr over 30 Minutes Intravenous  Once 09/04/16 1114 09/04/16 1327       Assessment/Plan AKI DM type II HTN Hypothyroidism Obesity  Gallstone pancreatitis, acute cholecystits S/P laparoscopic cholecystectomy, Dr. Corliss Skainssuei, 09/09/16  FEN: soft diet  VTE: heparin  ID: Zosyn 6/13; 6/18>>Rocephin 6/13>6/17  DISPO: Pt needs to ambulate. LFT's trending down, Tbili normal. OK for discharge from a surgical standpoint. PO pain meds   LOS: 8 days    Jerre SimonJessica L Focht , [facility] 09/12/2016, 9:39 AM Pager: [phone] Consults: [phone] Mon-Fri 7:00 am-4:30 pm Sat-Sun 7:00 am-11:30 am

## 2016-09-12 NOTE — Progress Notes (Addendum)
PROGRESS NOTE    Bonnie Hinton  ZOX:[id number] DOB: 1940/09/18 DOA: 09/04/2016 PCP: Sherlene Shams, MD  Brief Narrative:  Bonnie Hinton is a 76 y.o. female with medical history significant for hypertension, hyperlipidemia diabetes, hypothyroidism, cholelithiasis, GERD, chronic back pain presented to emergency department with worsening abdominal pain. Noted to have acute kidney injury, leukocytosis, hypokalemia, acute gallstone pancreatitis. -improving slowly, pancreatitis treated with supportive care, MRCP showed no CBD obstruction, seen by GI, ERCP not felt to be indicated, LFTs/Bili improved. Pt underwent lap chole  Assessment & Plan:   Pancreatitis, gallstone/Cholecystitis -suspect passed stone, LFTS/Bili/lipase much better -Gi concerned that Victoza could have contributed to pancreatitis  -MRCP without CBD stone/obstruction, + cholelithiases  -HIDA scan with cholecystitis, Patient was started on Zosyn -CCS okayed discharge from their standpoint, s/p Lap Chole 6/18 -Pt eval and recommended SNF.   Fever - Unknown source. Patient has been on 8 days of antibiotics. - We'll continue to monitor if patient spikes another fever we'll plan on obtaining blood cultures 2. - For now we'll continue current antibiotic regimen. Another consideration given recent operation would be to contact general surgery should fever recur. White blood cell count trending down. Will reassess white blood cell count next a.m. - No complaints of UTI type symptoms  AKI -Suspected due to pancreatitis,  -MRI of abdomen reported no hydronephrosis   Diabetes mellitus type 2, controlled (HCC) - holding victoza (secondary to suspected cause of pancreatitis), continue SSI,  blood sugars relatively well controlled on this regimen   Hypertension - Relatively well controlled on norvasc   Hypothyroidism.  - stable continue synthroid 100 mcg IV, consulted pharmacy to switch to oral synthroid  regimen.   Obesity/Hx fatty liver  DVT prophylaxis:Hep SQ Code Status: Full Code Family Communication: addendum: tried to [employer] but got her VM Disposition Plan: SNF  Consultants: GI , CCS  Subjective: Pt feels better no new complaints reported to me  Objective: Vitals:   09/11/16 2212 09/12/16 0141 09/12/16 0437 09/12/16 1343  BP:   (!) 156/64 (!) 130/56  Pulse:   83 76  Resp:   18 20  Temp: 98.3 F (36.8 C) 98.6 F (37 C) 98.5 F (36.9 C)   TempSrc: Oral Oral Oral   SpO2:   98% 98%  Weight:      Height:        Intake/Output Summary (Last 24 hours) at 09/12/16 1514 Last data filed at 09/12/16 1406  Gross per 24 hour  Intake              310 ml  Output              900 ml  Net             -590 ml   Filed Weights   09/09/16 0724 09/10/16 0500 09/11/16 0534  Weight: (!) 143.5 kg (316 lb 6.4 oz) (!) 140.9 kg (310 lb 11.2 oz) (!) 140.8 kg (310 lb 8 oz)    Examination:  Gen: Awake, Alert, Oriented X 3, obese, in NAD HEENT: PERRLA, no JVD,  Neck supple, Lungs:  Equal chest rise, no wheezes CVS: RRR,No Rubs or new Murmurs Gallops, Abd: steri strips in place, no bleeding, no guarding, + bowel sounds Extremities: trace edema, + pulses Skin: no new rashes, warm and dry   Data Reviewed:   CBC:  Recent Labs Lab 09/06/16 0504 09/07/16 0630 09/08/16 0738 09/10/16 0814 09/11/16 0723  WBC 9.9 10.0 9.3 14.7* 11.8*  HGB 11.9* 11.8* 12.9 12.9 11.2*  HCT 37.9 37.3 40.4 40.2 35.1*  MCV 92.0 91.2 91.2 91.6 91.4  PLT 183 200 206 220 284   Basic Metabolic Panel:  Recent Labs Lab 09/06/16 0504 09/07/16 0630 09/08/16 0738 09/10/16 0814 09/11/16 0723  NA 137 138 139 138 134*  K 4.4 3.9 4.0 3.6 3.4*  CL 103 103 104 103 100*  CO2 24 24 24 23 25   GLUCOSE 175* 114* 104* 135* 130*  BUN 33* 20 17 14 11   CREATININE 1.66* 1.26* 1.16* 1.24* 1.23*  CALCIUM 7.8* 8.0* 8.6* 8.2* 7.9*   GFR: Estimated Creatinine Clearance: 56.5 mL/min (A) (by C-G formula  based on SCr of 1.23 mg/dL (H)). Liver Function Tests:  Recent Labs Lab 09/06/16 0504 09/07/16 0630 09/08/16 0738 09/10/16 0814 09/11/16 0723  AST 77* 31 23 58* 53*  ALT 298* 193* 127* 58* 47  ALKPHOS 145* 145* 125 115 101  BILITOT 1.2 0.7 0.7 0.8 0.7  PROT 6.4* 6.4* 6.7 6.7 6.4*  ALBUMIN 2.5* 2.3* 2.4* 2.3* 2.0*    Recent Labs Lab 09/06/16 0504 09/07/16 0630  LIPASE 69* 56*   No results for input(s): AMMONIA in the last 168 hours. Coagulation Profile: No results for input(s): INR, PROTIME in the last 168 hours. Cardiac Enzymes: No results for input(s): CKTOTAL, CKMB, CKMBINDEX, TROPONINI in the last 168 hours. BNP (last 3 results) No results for input(s): PROBNP in the last 8760 hours. HbA1C: No results for input(s): HGBA1C in the last 72 hours. CBG:  Recent Labs Lab 09/11/16 2052 09/12/16 0037 09/12/16 0436 09/12/16 0820 09/12/16 1139  GLUCAP 148* 159* 144* 148* 186*   Lipid Profile: No results for input(s): CHOL, HDL, LDLCALC, TRIG, CHOLHDL, LDLDIRECT in the last 72 hours. Thyroid Function Tests: No results for input(s): TSH, T4TOTAL, FREET4, T3FREE, THYROIDAB in the last 72 hours. Anemia Panel: No results for input(s): VITAMINB12, FOLATE, FERRITIN, TIBC, IRON, RETICCTPCT in the last 72 hours. Urine analysis:    Component Value Date/Time   COLORURINE AMBER (A) 09/04/2016 1616   APPEARANCEUR CLOUDY (A) 09/04/2016 1616   LABSPEC 1.020 09/04/2016 1616   PHURINE 5.0 09/04/2016 1616   GLUCOSEU 50 (A) 09/04/2016 1616   GLUCOSEU NEGATIVE 04/25/2015 1359   HGBUR MODERATE (A) 09/04/2016 1616   BILIRUBINUR SMALL (A) 09/04/2016 1616   BILIRUBINUR neg 04/25/2015 1450   KETONESUR NEGATIVE 09/04/2016 1616   PROTEINUR 100 (A) 09/04/2016 1616   UROBILINOGEN 0.2 04/25/2015 1450   UROBILINOGEN 0.2 04/25/2015 1359   NITRITE NEGATIVE 09/04/2016 1616   LEUKOCYTESUR TRACE (A) 09/04/2016 1616   Sepsis Labs: @LABRCNTIP (procalcitonin:4,lacticidven:4)  ) Recent  Results (from the past 240 hour(s))  MRSA PCR Screening     Status: None   Collection Time: 09/04/16  4:53 PM  Result Value Ref Range Status   MRSA by PCR NEGATIVE NEGATIVE Final    Comment:        The GeneXpert MRSA Assay (FDA approved for NASAL specimens only), is one component of a comprehensive MRSA colonization surveillance program. It is not intended to diagnose MRSA infection nor to guide or monitor treatment for MRSA infections.   Surgical pcr screen     Status: None   Collection Time: 09/08/16  2:33 PM  Result Value Ref Range Status   MRSA, PCR NEGATIVE NEGATIVE Final   Staphylococcus aureus NEGATIVE NEGATIVE Final    Comment:        The Xpert SA Assay (FDA approved for NASAL specimens in patients over 21 years of  age), is one component of a comprehensive surveillance program.  Test performance has been validated by [facility] for patients greater than or equal to 42 year old. It is not intended to diagnose infection nor to guide or monitor treatment.      Radiology Studies: No results found.   Scheduled Meds: . amLODipine  5 mg Oral Daily  . Chlorhexidine Gluconate Cloth  6 each Topical Once  . cycloSPORINE  1 drop Both Eyes BID  . heparin  5,000 Units Subcutaneous Q8H  . insulin aspart  0-9 Units Subcutaneous Q4H  . levothyroxine  100 mcg Intravenous Daily  . sodium chloride flush  3 mL Intravenous Q12H   Continuous Infusions: . dextrose 5% lactated ringers 10 mL/hr at 09/12/16 0547  . piperacillin-tazobactam (ZOSYN)  IV 3.375 g (09/12/16 1405)  . sodium chloride       LOS: 8 days    Time spent: > 30 min  Penny Pia  [facility] Pager [phone]  If 7PM-7AM, please contact night-coverage [email or web address] Password [facility] 09/12/2016, 3:14 PM

## 2016-09-13 ENCOUNTER — Encounter (HOSPITAL_COMMUNITY): Payer: Self-pay

## 2016-09-13 LAB — CBC
HCT: 33.6 % — ABNORMAL LOW (ref 36.0–46.0)
Hemoglobin: 10.6 g/dL — ABNORMAL LOW (ref 12.0–15.0)
MCH: 29 pg (ref 26.0–34.0)
MCHC: 31.5 g/dL (ref 30.0–36.0)
MCV: 91.8 fL (ref 78.0–100.0)
Platelets: 315 10*3/uL (ref 150–400)
RBC: 3.66 MIL/uL — ABNORMAL LOW (ref 3.87–5.11)
RDW: 14.2 % (ref 11.5–15.5)
WBC: 11.1 10*3/uL — ABNORMAL HIGH (ref 4.0–10.5)

## 2016-09-13 LAB — GLUCOSE, CAPILLARY
Glucose-Capillary: 200 mg/dL — ABNORMAL HIGH (ref 65–99)
Glucose-Capillary: 130 mg/dL — ABNORMAL HIGH (ref 65–99)
Glucose-Capillary: 169 mg/dL — ABNORMAL HIGH (ref 65–99)
Glucose-Capillary: 175 mg/dL — ABNORMAL HIGH (ref 65–99)
Glucose-Capillary: 142 mg/dL — ABNORMAL HIGH (ref 65–99)

## 2016-09-13 LAB — BASIC METABOLIC PANEL
Anion gap: 9 (ref 5–15)
BUN: 13 mg/dL (ref 6–20)
CO2: 26 mmol/L (ref 22–32)
Calcium: 7.9 mg/dL — ABNORMAL LOW (ref 8.9–10.3)
Chloride: 100 mmol/L — ABNORMAL LOW (ref 101–111)
Creatinine, Ser: 1.28 mg/dL — ABNORMAL HIGH (ref 0.44–1.00)
GFR calc Af Amer: 46 mL/min — ABNORMAL LOW (ref >60)
GFR calc non Af Amer: 40 mL/min — ABNORMAL LOW (ref >60)
Glucose, Bld: 164 mg/dL — ABNORMAL HIGH (ref 65–99)
Potassium: 3.5 mmol/L (ref 3.5–5.1)
Sodium: 135 mmol/L (ref 135–145)

## 2016-09-13 MED ORDER — OXYCODONE HCL 5 MG PO TABS
10.0000 mg | ORAL_TABLET | ORAL | Status: DC | PRN
  Administered 2016-09-13 – 2016-09-14 (×6): 10 mg via ORAL
  Filled 2016-09-13 (×6): qty 2

## 2016-09-13 MED ORDER — SENNA 8.6 MG PO TABS
1.0000 | ORAL_TABLET | Freq: Every day | ORAL | Status: DC
  Administered 2016-09-13 – 2016-09-14 (×2): 8.6 mg via ORAL
  Filled 2016-09-13 (×2): qty 1

## 2016-09-13 NOTE — Progress Notes (Addendum)
Patient disconnect her IV by mistake and Zosyn bag had 29 ml  medicine left. Pharmacy notified. IV team notified. Will continue to monitor.

## 2016-09-13 NOTE — Progress Notes (Signed)
MD informed that patient is constipated and she requested medicine to help her having a BM. Will continue to monitor.

## 2016-09-13 NOTE — Progress Notes (Signed)
PROGRESS NOTE    Bonnie Hinton  BJY:[id number] DOB: Mar 26, 1940 DOA: 09/04/2016 PCP: Sherlene Shamsullo, Teresa L, MD  Brief Narrative:  Bonnie Hinton is a 76 y.o. female with medical history significant for hypertension, hyperlipidemia diabetes, hypothyroidism, cholelithiasis, GERD, chronic back pain presented to emergency department with worsening abdominal pain. Noted to have acute kidney injury, leukocytosis, hypokalemia, acute gallstone pancreatitis. -improving slowly, pancreatitis treated with supportive care, MRCP showed no CBD obstruction, seen by GI, ERCP not felt to be indicated, LFTs/Bili improved. Pt underwent lap chole  Assessment & Plan:   Pancreatitis, gallstone/Cholecystitis -suspect passed stone, LFTS/Bili/lipase much better -Gi concerned that Victoza could have contributed to pancreatitis  -MRCP without CBD stone/obstruction, + cholelithiases  -HIDA scan with cholecystitis, Patient was started on Zosyn -CCS okayed discharge from their standpoint, s/p Lap Chole 6/18 -Pt eval and recommended SNF.  - No changes in plans at this point. Patient complaining of constipation. Requesting medication discuss with surgeon was okay with me giving laxatives.  Fever - Unknown source. Patient has been on 8 days of antibiotics. - Resolved no fevers in the last 24 hours no need for further workup and white blood cell nearing normal levels  AKI -Suspected due to pancreatitis,  -MRI of abdomen reported no hydronephrosis - Currently patient is at or near baseline   Diabetes mellitus type 2, controlled (HCC) - holding victoza (secondary to suspected cause of pancreatitis),  - continue SSI blood sugars relatively well controlled   Hypertension - Relatively well controlled on norvasc   Hypothyroidism.  - continue prior to admission synthroid regimen.   Obesity/Hx fatty liver  DVT prophylaxis: Heparin Code Status: Full Code Family Communication: Clydene PughMary Oliger Disposition Plan:  SNF  Consultants: GI , CCS  Subjective: Daughter worried about anxiety. Patient   Objective: Vitals:   09/12/16 1343 09/12/16 2130 09/13/16 0356 09/13/16 1444  BP: (!) 130/56 (!) 157/66 (!) 143/65 131/79  Pulse: 76 87 78 84  Resp: 20 18 18 20   Temp:  98 F (36.7 C) 08.698.6 F (37 C) 98.4 F (36.9 C)  TempSrc:  Oral Oral Oral  SpO2: 98% 96% 97% 100%  Weight:      Height:        Intake/Output Summary (Last 24 hours) at 09/13/16 1848 Last data filed at 09/13/16 1631  Gross per 24 hour  Intake           558.67 ml  Output              300 ml  Net           258.67 ml   Filed Weights   09/09/16 0724 09/10/16 0500 09/11/16 0534  Weight: (!) 143.5 kg (316 lb 6.4 oz) (!) 140.9 kg (310 lb 11.2 oz) (!) 140.8 kg (310 lb 8 oz)    Examination:Physical exam unchanged from yesterday 09/12/2016  Gen: Awake, Alert, Oriented X 3, obese, in NAD HEENT: PERRLA, no JVD,  Neck supple, Lungs:  Equal chest rise, no wheezes CVS: RRR,No Rubs or new Murmurs Gallops, Abd: steri strips in place, no bleeding, no guarding, + bowel sounds Extremities: trace edema, + pulses Skin: no new rashes, warm and dry   Data Reviewed:   CBC:  Recent Labs Lab 09/07/16 0630 09/08/16 0738 09/10/16 0814 09/11/16 0723 09/13/16 0425  WBC 10.0 9.3 14.7* 11.8* 11.1*  HGB 11.8* 12.9 12.9 11.2* 10.6*  HCT 37.3 40.4 40.2 35.1* 33.6*  MCV 91.2 91.2 91.6 91.4 91.8  PLT 200 206 220 284 315  Basic Metabolic Panel:  Recent Labs Lab 09/07/16 0630 09/08/16 0738 09/10/16 0814 09/11/16 0723 09/13/16 0425  NA 138 139 138 134* 135  K 3.9 4.0 3.6 3.4* 3.5  CL 103 104 103 100* 100*  CO2 24 24 23 25 26   GLUCOSE 114* 104* 135* 130* 164*  BUN 20 17 14 11 13   CREATININE 1.26* 1.16* 1.24* 1.23* 1.28*  CALCIUM 8.0* 8.6* 8.2* 7.9* 7.9*   GFR: Estimated Creatinine Clearance: 54.3 mL/min (A) (by C-G formula based on SCr of 1.28 mg/dL (H)). Liver Function Tests:  Recent Labs Lab 09/07/16 0630 09/08/16 0738  09/10/16 0814 09/11/16 0723  AST 31 23 58* 53*  ALT 193* 127* 58* 47  ALKPHOS 145* 125 115 101  BILITOT 0.7 0.7 0.8 0.7  PROT 6.4* 6.7 6.7 6.4*  ALBUMIN 2.3* 2.4* 2.3* 2.0*    Recent Labs Lab 09/07/16 0630  LIPASE 56*   No results for input(s): AMMONIA in the last 168 hours. Coagulation Profile: No results for input(s): INR, PROTIME in the last 168 hours. Cardiac Enzymes: No results for input(s): CKTOTAL, CKMB, CKMBINDEX, TROPONINI in the last 168 hours. BNP (last 3 results) No results for input(s): PROBNP in the last 8760 hours. HbA1C: No results for input(s): HGBA1C in the last 72 hours. CBG:  Recent Labs Lab 09/13/16 0001 09/13/16 0352 09/13/16 0738 09/13/16 1204 09/13/16 1710  GLUCAP 153* 200* 130* 169* 175*   Lipid Profile: No results for input(s): CHOL, HDL, LDLCALC, TRIG, CHOLHDL, LDLDIRECT in the last 72 hours. Thyroid Function Tests: No results for input(s): TSH, T4TOTAL, FREET4, T3FREE, THYROIDAB in the last 72 hours. Anemia Panel: No results for input(s): VITAMINB12, FOLATE, FERRITIN, TIBC, IRON, RETICCTPCT in the last 72 hours. Urine analysis:    Component Value Date/Time   COLORURINE AMBER (A) 09/04/2016 1616   APPEARANCEUR CLOUDY (A) 09/04/2016 1616   LABSPEC 1.020 09/04/2016 1616   PHURINE 5.0 09/04/2016 1616   GLUCOSEU 50 (A) 09/04/2016 1616   GLUCOSEU NEGATIVE 04/25/2015 1359   HGBUR MODERATE (A) 09/04/2016 1616   BILIRUBINUR SMALL (A) 09/04/2016 1616   BILIRUBINUR neg 04/25/2015 1450   KETONESUR NEGATIVE 09/04/2016 1616   PROTEINUR 100 (A) 09/04/2016 1616   UROBILINOGEN 0.2 04/25/2015 1450   UROBILINOGEN 0.2 04/25/2015 1359   NITRITE NEGATIVE 09/04/2016 1616   LEUKOCYTESUR TRACE (A) 09/04/2016 1616   Sepsis Labs: @LABRCNTIP (procalcitonin:4,lacticidven:4)  ) Recent Results (from the past 240 hour(s))  MRSA PCR Screening     Status: None   Collection Time: 09/04/16  4:53 PM  Result Value Ref Range Status   MRSA by PCR NEGATIVE  NEGATIVE Final    Comment:        The GeneXpert MRSA Assay (FDA approved for NASAL specimens only), is one component of a comprehensive MRSA colonization surveillance program. It is not intended to diagnose MRSA infection nor to guide or monitor treatment for MRSA infections.   Surgical pcr screen     Status: None   Collection Time: 09/08/16  2:33 PM  Result Value Ref Range Status   MRSA, PCR NEGATIVE NEGATIVE Final   Staphylococcus aureus NEGATIVE NEGATIVE Final    Comment:        The Xpert SA Assay (FDA approved for NASAL specimens in patients over 2 years of age), is one component of a comprehensive surveillance program.  Test performance has been validated by [facility] for patients greater than or equal to 49 year old. It is not intended to diagnose infection nor to guide or monitor  treatment.      Radiology Studies: No results found.   Scheduled Meds: . amLODipine  5 mg Oral Daily  . cycloSPORINE  1 drop Both Eyes BID  . heparin  5,000 Units Subcutaneous Q8H  . insulin aspart  0-9 Units Subcutaneous Q4H  . levothyroxine  225 mcg Oral QAC breakfast  . senna  1 tablet Oral Daily  . sodium chloride flush  3 mL Intravenous Q12H   Continuous Infusions: . dextrose 5% lactated ringers 10 mL/hr at 09/12/16 0547  . piperacillin-tazobactam (ZOSYN)  IV Stopped (09/13/16 1630)  . sodium chloride       LOS: 9 days    Time spent: > 35 min  Penny Pia  [facility] Pager [phone]  If 7PM-7AM, please contact night-coverage [email or web address] Password [email or web address] 09/13/2016, 6:48 PM

## 2016-09-13 NOTE — Progress Notes (Signed)
Physical Therapy Treatment Patient Details Name: Bonnie Hinton MRN: [id number] DOB: Aug 25, 1940 Today's Date: 09/13/2016    History of Present Illness Bonnie Hinton is a 76 y.o. female with medical history significant for hypertension, hyperlipidemia diabetes, hypothyroidism, cholelithiasis, GERD, chronic back pain presents to emergency department chief complaint persistent worsening abdominal pain. Initial evaluation reveals acute kidney injury, leukocytosis, hypokalemia, acute gallstone pancreatitis.  pt s/p Lap Chole.    PT Comments    Pt assisted more throughout her bed mobility, scooting and transfer today.  Still painful at an 8/10, but noticeably improved.  Pt able to clear her feet with transfer today, though is not ready for gait.   Follow Up Recommendations  SNF;Supervision/Assistance - 24 hour     Equipment Recommendations  None recommended by PT    Recommendations for Other Services       Precautions / Restrictions Precautions Precautions: Fall    Mobility  Bed Mobility Overal bed mobility: Needs Assistance Bed Mobility: Sit to Supine     Supine to sit: Mod assist     General bed mobility comments: facilitated pt using bil UE's to help her scoot to EOB.  pt asked to give more assist to the transition supine to sit.  Transfers Overall transfer level: Needs assistance Equipment used: Rolling walker (2 wheeled) Transfers: Sit to/from [employer]Stand;Stand Pivot Transfers Sit to Stand: Mod assist;+2 physical assistance Stand pivot transfers: Min assist;+2 safety/equipment       General transfer comment: pt required less assist in every aspect of mobility to chair.  Pt still wanting to stay on elbows with RW, which she was not allow to do today.  Pt cleared her feet with every pivotal step, side step and back up.  Pt completed a 2nd standing trial from the chair to help give her confidence that she could do it with nursing assist.  Ambulation/Gait              General Gait Details: pivot only at this time.   Stairs            Wheelchair Mobility    Modified Rankin (Stroke Patients Only)       Balance Overall balance assessment: Needs assistance Sitting-balance support: No upper extremity supported Sitting balance-Leahy Scale: Fair     Standing balance support: Bilateral upper extremity supported Standing balance-Leahy Scale: Poor Standing balance comment: heavy use of RW                            Cognition Arousal/Alertness: Awake/alert Behavior During Therapy: WFL for tasks assessed/performed Overall Cognitive Status: Within Functional Limits for tasks assessed                                        Exercises      General Comments        Pertinent Vitals/Pain Pain Assessment: Faces Faces Pain Scale: Hurts whole lot Pain Location: all over--abdomen and feet. Pain Descriptors / Indicators: Aching;Moaning Pain Intervention(s): Monitored during session;Limited activity within patient's tolerance    Home Living                      Prior Function            PT Goals (current goals can now be found in the care plan section) Acute Rehab PT Goals Patient Stated  Goal: want to get home eventually PT Goal Formulation: With patient Time For Goal Achievement: 09/24/16 Potential to Achieve Goals: Fair Progress towards PT goals: Progressing toward goals    Frequency    Min 3X/week      PT Plan Current plan remains appropriate    Co-evaluation              AM-PAC PT "6 Clicks" Daily Activity  Outcome Measure  Difficulty turning over in bed (including adjusting bedclothes, sheets and blankets)?: Total Difficulty moving from lying on back to sitting on the side of the bed? : Total Difficulty sitting down on and standing up from a chair with arms (e.g., wheelchair, bedside commode, etc,.)?: Total Help needed moving to and from a bed to chair (including a  wheelchair)?: A Lot Help needed walking in hospital room?: A Lot Help needed climbing 3-5 steps with a railing? : Total 6 Click Score: 8    End of Session   Activity Tolerance: Patient tolerated treatment well;Patient limited by pain Patient left: in chair;with call bell/phone within reach;with chair alarm set Nurse Communication: Mobility status PT Visit Diagnosis: Other abnormalities of gait and mobility (R26.89);Muscle weakness (generalized) (M62.81) Pain - part of body:  (abdomen and feet)     Time: 2130-8657 PT Time Calculation (min) (ACUTE ONLY): 27 min  Charges:  $Therapeutic Exercise: 8-22 mins $Therapeutic Activity: 8-22 mins                    G Codes:       October 12, 2016  [place] Bing, PT [phone]  (pager)   Eliseo Gum Jack Mineau 2016-10-12, 1:25 PM

## 2016-09-14 LAB — GLUCOSE, CAPILLARY
Glucose-Capillary: 105 mg/dL — ABNORMAL HIGH (ref 65–99)
Glucose-Capillary: 133 mg/dL — ABNORMAL HIGH (ref 65–99)
Glucose-Capillary: 172 mg/dL — ABNORMAL HIGH (ref 65–99)
Glucose-Capillary: 183 mg/dL — ABNORMAL HIGH (ref 65–99)

## 2016-09-14 MED ORDER — SERTRALINE HCL 50 MG PO TABS: 50.0000 mg | ORAL_TABLET | Freq: Every day | ORAL | Status: DC

## 2016-09-14 NOTE — Clinical Social Work Placement (Signed)
   CLINICAL SOCIAL WORK PLACEMENT  NOTE  Date:  09/14/2016  Patient Details  Name: Bonnie Hinton MRN: [id number] Date of Birth: 1940/03/31  Clinical Social Work is seeking post-discharge placement for this patient at the Skilled  Nursing Facility level of care (*CSW will initial, date and re-position this form in  chart as items are completed):  Yes   Patient/family provided with [place] Clinical Social Work Department's list of facilities offering this level of care within the geographic area requested by the patient (or if unable, by the patient's family).  Yes   Patient/family informed of their freedom to choose among providers that offer the needed level of care, that participate in Medicare, Medicaid or managed care program needed by the patient, have an available bed and are willing to accept the patient.  Yes   Patient/family informed of 's ownership interest in [facility] and [facility], as well as of the fact that they are under no obligation to receive care at these facilities.  PASRR submitted to EDS on       PASRR number received on       Existing PASRR number confirmed on 09/11/16     FL2 transmitted to all facilities in geographic area requested by pt/family on 09/11/16     FL2 transmitted to all facilities within larger geographic area on       Patient informed that his/her managed care company has contracts with or will negotiate with certain facilities, including the following:        Yes   Patient/family informed of bed offers received.  [facility]     Physician recommends and patient chooses bed at      [facility] on 09/14/16.  Patient to be transferred to facility by [facility]     Patient family notified on 09/14/16 of transfer.  Name of family member notified:  Mary     PHYSICIAN Please prepare priority discharge summary, including medications, Please prepare  prescriptions     Additional Comment:    _______________________________________________ Maree KrabbeBridget A Charlese Gruetzmacher, LCSW 09/14/2016, 11:04 AM

## 2016-09-14 NOTE — Clinical Social Work Note (Signed)
CSW spoke with pt's daughter. [name] aware of d/c today [facility]. [name] agreeable and CSW will call pt's daughter after transport is called. Pending d/c summary.  [place]armingtonBridget Hale Ch[place], [place] [phone]

## 2016-09-14 NOTE — Progress Notes (Signed)
Cletus GashElizabeth A Garrod to be D/C'd Skilled nursing facility per MD order.  Discussed with the patient and all questions fully answered.  VSS, Skin clean, dry and intact without evidence of skin break down, no evidence of skin tears noted. IV catheter discontinued intact. Site without signs and symptoms of complications. Dressing and pressure applied.  An After Visit Summary was printed and given to the pitar.   D/c education completed with patient and pitar including follow up instructions, medication list, d/c activities limitations if indicated, with other d/c instructions as indicated by MD - patient able to verbalize understanding, all questions fully answered.   Patient instructed to return to ED, call 911, or call MD for any changes in condition.   Patient escorted via WC, and D/C home via private auto.  Melvenia Needlesreti O Johnmark Geiger 09/14/2016 5:06 PM

## 2016-09-14 NOTE — Clinical Social Work Note (Signed)
Clinical Social Worker facilitated patient discharge including contacting patient family and facility to confirm patient discharge plans.  Clinical information faxed to facility and family agreeable with plan.  CSW arranged ambulance transport via [facility] .  RN to call [phone] for report prior to discharge. Patient going to room 509.  Clinical Social Worker will sign off for now as social work intervention is no longer needed. Please consult us again if new need arises.  [place]fieldBridget Sherelle Castelli, [place]WA [phone]

## 2016-09-14 NOTE — Discharge Summary (Signed)
Physician Discharge Summary  Bonnie Hinton ZOX:[id number] DOB: September 06, 1940 DOA: 09/04/2016  PCP: Sherlene Shamsullo, Teresa L, MD  Admit date: 09/04/2016 Discharge date: 09/14/2016  Time spent: > 35 minutes  Recommendations for Outpatient Follow-up:  1. Please assess wbc levels.  2. Also reassess CMP due to elevated liver enzymes lipid lowering medication held: Zetia and Statin 3. Prior to discharge hypoglycemic agent discontinue Victoza given suspicion that it may have led to pancreatitis and cholecystitis  4. I held aspirin on d/c as patient does not have history of any stent placement on review of problem list. Please decide when to continue if appropriate.   Discharge Diagnoses:  Principal Problem:   Pancreatitis, gallstone Active Problems:   Diabetes mellitus type 2, controlled (HCC)   Morbid obesity (HCC)   Hyperlipidemia   Acquired hypothyroidism   Chronic low back pain with right-sided sciatica   Leukocytosis   Anxiety, generalized   Fatty liver   Cholelithiasis   Acute kidney injury (HCC)   Diarrhea   Idiopathic acute pancreatitis without infection or necrosis   Shock liver   Discharge Condition: stable  Diet recommendation:   Filed Weights   09/10/16 0500 09/11/16 0534 09/14/16 0439  Weight: (!) 140.9 kg (310 lb 11.2 oz) (!) 140.8 kg (310 lb 8 oz) (!) 143.4 kg (316 lb 3.2 oz)    History of present illness:  76 y.o.femalewith medical history significant for hypertension, hyperlipidemia diabetes, hypothyroidism, cholelithiasis, GERD, chronic back pain presented to emergency department with worsening abdominal pain. Noted to have acute kidney injury, leukocytosis, hypokalemia, acute gallstone pancreatitis. -improving slowly, pancreatitis treated with supportive care, MRCP showed no CBD obstruction, seen by GI, ERCP not felt to be indicated, LFTs/Bili improved. Pt underwent lap chole  Hospital Course:  Pancreatitis, gallstone/Cholecystitis -suspect passed stone,  LFTS/Bili/lipase much better -Gi concerned that Victoza could have contributed to pancreatitis  -MRCP without CBD stone/obstruction, + cholelithiases  -HIDA scan with cholecystitis, Patient was started on Zosyn -[facility]S okayed discharge from their standpoint, s/p Lap Chole 6/18 -Pt eval and recommended SNF.  - No changes in plans at this point. Patient complaining of constipation. Requesting medication discuss with surgeon was okay with me giving laxatives. - On day of discharge will administer soapsuds enema  Fever - Unknown source. Patient has been on 8 days of antibiotics. - Resolved no fevers in the last 24 hours no need for further workup and white blood cell nearing normal levels - 6 general surgery did not recommend continuing antibiotics on discharge. Patient has received 9 days of antibiotics. Fevers have subsided and patient clinically feels better. As such will plan on discontinuing antibiotics on discharge as mentioned above  AKI -Suspected due to pancreatitis,  -MRI of abdomen reported no hydronephrosis - Serum creatinine stable at 1.2   Diabetes mellitus type 2, controlled (HCC) - holding victoza (secondary to suspected cause of pancreatitis),  - Recommend diabetic diet on discharge   Hypertension - Continue Norvasc on discharge   Hypothyroidism.  - continue prior to admission synthroid regimen.   Obesity/Hx fatty liver  Procedures:  Status post laparoscopic cholecystectomy  Consultations:  General surgery  Gastroenterology  Discharge Exam: Vitals:   09/13/16 2212 09/14/16 0439  BP: 127/68 139/70  Pulse: 81 76  Resp: 18 19  Temp: 98.5 F (36.9 C) 98.4 F (36.9 C)    General: Pt in nad, alert and awake Cardiovascular: rrr, no rubs Respiratory: no increased wob, no wheezes  Discharge Instructions   Discharge Instructions    Call  MD for:  persistant nausea and vomiting    Complete by:  As directed    Call MD for:  severe uncontrolled pain     Complete by:  As directed    Diet - low sodium heart healthy    Complete by:  As directed    Discharge instructions    Complete by:  As directed    Please be sure to follow up with your primary care physician at the facility   Increase activity slowly    Complete by:  As directed      Current Discharge Medication List    CONTINUE these medications which have CHANGED   Details  sertraline (ZOLOFT) 50 MG tablet Take 1 tablet (50 mg total) by mouth daily.      CONTINUE these medications which have NOT CHANGED   Details  acetaminophen (TYLENOL) 325 MG tablet Take 2 tablets (650 mg total) by mouth every 6 (six) hours as needed for mild pain (or Fever >/= 101).    amLODipine (NORVASC) 5 MG tablet Take 1 tablet (5 mg total) by mouth daily. Qty: 90 tablet, Refills: 3    cycloSPORINE (RESTASIS) 0.05 % ophthalmic emulsion Place 1 drop into both eyes 2 (two) times daily.    gabapentin (NEURONTIN) 300 MG capsule Take 1 capsule (300 mg total) by mouth 2 (two) times daily. Qty: 60 capsule, Refills: 0    !! levothyroxine (SYNTHROID, LEVOTHROID) 200 MCG tablet Take 1 tablet (200 mcg total) by mouth daily before breakfast. With 25 mcg daily  (total daily dose 225 mcg) Qty: 90 tablet, Refills: 1    LUMIGAN 0.01 % SOLN Place 1-2 drops into both eyes at bedtime.    pantoprazole (PROTONIX) 40 MG tablet Take 1 tablet (40 mg total) by mouth daily. Qty: 90 tablet, Refills: 3    !! SYNTHROID 25 MCG tablet Take 25 mcg by mouth daily. Take with tablet to equal    tolterodine (DETROL LA) 4 MG 24 hr capsule TAKE 1 CAPSULE BY MOUTH TWICE DAILY Qty: 60 capsule, Refills: 3    Vitamin D, Ergocalciferol, (DRISDOL) 50000 units CAPS capsule TAKE 1 CAPSULE (50,000 UNITS TOTAL) BY MOUTH ONCE A WEEK. Qty: 4 capsule, Refills: 3     !! - Potential duplicate medications found. Please discuss with provider.    STOP taking these medications     aspirin EC 81 MG tablet      celecoxib (CELEBREX)  200 MG capsule      ezetimibe (ZETIA) 10 MG tablet      oxyCODONE-acetaminophen (PERCOCET) 10-325 MG tablet      rosuvastatin (CRESTOR) 20 MG tablet      VICTOZA 18 MG/3ML SOPN      LORazepam (ATIVAN) 0.5 MG tablet        Allergies  Allergen Reactions  . Erythromycin Rash  . Iodinated Diagnostic Agents Swelling and Rash  . Fentanyl Other (See Comments)    Duragesic-25  . Latex Itching and Rash  . Lisinopril Other (See Comments)    Hyperkalemia   . Other Rash    Paper tape, possibly adhesive tape    Contact information for follow-up providers    [facility] Go on 09/24/2016.   Specialty:  General Surgery Why:  11:00 am, please arrive 30 minutes prior to complete paperwork Contact information: [address] [place] [address] [phone]           Contact information for after-discharge care  Destination    [facility] .   Specialty:  Skilled Nursing Facility Contact information: [address] [place] [address] [phone]                   The results of significant diagnostics from this hospitalization (including imaging, microbiology, ancillary and laboratory) are listed below for reference.    Significant Diagnostic Studies: Dg Chest 2 View  Result Date: 09/04/2016 CLINICAL DATA:  Preop chest exam, bronchitis. EXAM: CHEST  2 VIEW COMPARISON:  None. FINDINGS: The heart size and mediastinal contours are within normal limits. Low lung volumes with mild increase in interstitial prominence suspicious for chronic bronchitic change. No pneumonic consolidation, effusion or pneumothorax. Mild degenerative change along the dorsal spine. IMPRESSION: Mild interstitial prominence bilaterally which may reflect chronic bronchitic change. No pulmonary consolidations to suggest pneumonia. Electronically Signed   By: Tollie Eth M.D.   On: 09/04/2016 13:51   Nm Hepatobiliary Liver  Func  Result Date: 09/08/2016 CLINICAL DATA:  Abdominal pain. Right upper quadrant abdominal pain the last few days. Acute kidney injury, leukocytosis, hypoproteinemia, acute gallstone pancreatitis. EXAM: NUCLEAR MEDICINE HEPATOBILIARY IMAGING TECHNIQUE: Sequential images of the abdomen were obtained out to 60 minutes following intravenous administration of radiopharmaceutical. RADIOPHARMACEUTICALS:  5.25 MCi Tc-32m  Choletec IV COMPARISON:  MRI 09/04/2016 FINDINGS: There is prompt uptake by the liver. Early activity is identified within the common bile duct and bowel. Initial imaging fails to demonstrate the gallbladder. 3 mg of morphine sulfate was given intravenously. Subsequent images are performed to 30 minutes, again demonstrating no evidence for gallbladder filling. IMPRESSION: Findings are consistent with acute cholecystitis. Electronically Signed   By: Norva Pavlov M.D.   On: 09/08/2016 10:44   Mr Abdomen Mrcp Wo Contrast  Result Date: 09/04/2016 CLINICAL DATA:  Right upper quadrant abdominal pain. Pancreatitis with elevated liver function studies. EXAM: MRI ABDOMEN WITHOUT CONTRAST  (INCLUDING MRCP) TECHNIQUE: Multiplanar multisequence MR imaging of the abdomen was performed. Heavily T2-weighted images of the biliary and pancreatic ducts were obtained, and three-dimensional MRCP images were rendered by post processing. COMPARISON:  Ultrasound today and 04/06/2016. FINDINGS: Despite efforts by the technologist and patient, moderate motion artifact is present on today's exam and could not be eliminated. This reduces exam sensitivity and specificity. Lower chest: The visualized lower chest appears remarkable. No significant pleural effusion. Hepatobiliary: The liver demonstrates loss of signal on the gradient echo opposed phase images consistent with steatosis. No focal lesions are identified. Multiple small gallstones are present. There is no gallbladder wall thickening or surrounding inflammation.  The ERCP images are degraded by motion. No significant biliary dilatation. No definite choledocholithiasis. Pancreas: Fatty replaced with mild surrounding edema. No focal fluid collection or ductal dilatation identified. Spleen: Normal in size without focal abnormality. Adrenals/Urinary Tract: Both adrenal glands appear normal. Tiny cyst in the lower pole of the left kidney. The right kidney appears normal. No hydronephrosis. Stomach/Bowel: No evidence of bowel wall thickening, distention or surrounding inflammatory change. Vascular/Lymphatic: Small lymph nodes in the porta hepatis, likely reactive. No enlarged retroperitoneal lymph nodes. No significant vascular findings on noncontrast imaging identified. Other: No ascites. Musculoskeletal: No acute or significant osseous findings. IMPRESSION: 1. Motion artifact results in suboptimal evaluation of the biliary system. Cholelithiasis without evidence of cholecystitis, significant biliary dilatation or definite choledocholithiasis. 2. Hepatic steatosis. 3. Peripancreatic edema consistent with pancreatitis. No focal fluid collection. Electronically Signed   By: Carey Bullocks M.D.   On: 09/04/2016 13:53   Mr  3d Recon At Scanner  Result Date: 09/04/2016 CLINICAL DATA:  Right upper quadrant abdominal pain. Pancreatitis with elevated liver function studies. EXAM: MRI ABDOMEN WITHOUT CONTRAST  (INCLUDING MRCP) TECHNIQUE: Multiplanar multisequence MR imaging of the abdomen was performed. Heavily T2-weighted images of the biliary and pancreatic ducts were obtained, and three-dimensional MRCP images were rendered by post processing. COMPARISON:  Ultrasound today and 04/06/2016. FINDINGS: Despite efforts by the technologist and patient, moderate motion artifact is present on today's exam and could not be eliminated. This reduces exam sensitivity and specificity. Lower chest: The visualized lower chest appears remarkable. No significant pleural effusion. Hepatobiliary: The  liver demonstrates loss of signal on the gradient echo opposed phase images consistent with steatosis. No focal lesions are identified. Multiple small gallstones are present. There is no gallbladder wall thickening or surrounding inflammation. The ERCP images are degraded by motion. No significant biliary dilatation. No definite choledocholithiasis. Pancreas: Fatty replaced with mild surrounding edema. No focal fluid collection or ductal dilatation identified. Spleen: Normal in size without focal abnormality. Adrenals/Urinary Tract: Both adrenal glands appear normal. Tiny cyst in the lower pole of the left kidney. The right kidney appears normal. No hydronephrosis. Stomach/Bowel: No evidence of bowel wall thickening, distention or surrounding inflammatory change. Vascular/Lymphatic: Small lymph nodes in the porta hepatis, likely reactive. No enlarged retroperitoneal lymph nodes. No significant vascular findings on noncontrast imaging identified. Other: No ascites. Musculoskeletal: No acute or significant osseous findings. IMPRESSION: 1. Motion artifact results in suboptimal evaluation of the biliary system. Cholelithiasis without evidence of cholecystitis, significant biliary dilatation or definite choledocholithiasis. 2. Hepatic steatosis. 3. Peripancreatic edema consistent with pancreatitis. No focal fluid collection. Electronically Signed   By: Carey Bullocks M.D.   On: 09/04/2016 13:53   Dg Chest Port 1 View  Result Date: 09/06/2016 CLINICAL DATA:  Shortness of breath. EXAM: PORTABLE CHEST 1 VIEW COMPARISON:  09/04/2016 FINDINGS: The cardiomediastinal silhouette is unchanged. The lungs remain mildly hypoinflated with mild pulmonary vascular congestion and mild interstitial prominence. There is new patchy right basilar airspace opacity. No sizable pleural effusion or pneumothorax is identified. IMPRESSION: Hypoinflation with mild pulmonary vascular congestion and new right basilar airspace opacity which may  reflect developing pneumonia, asymmetric edema, or atelectasis. Electronically Signed   By: Sebastian Ache M.D.   On: 09/06/2016 15:03   US Abdomen Limited Ruq  Result Date: 09/04/2016 CLINICAL DATA:  Right upper quadrant pain EXAM: ULTRASOUND ABDOMEN LIMITED RIGHT UPPER QUADRANT COMPARISON:  Ultrasound abdomen 04/06/2016 FINDINGS: Gallbladder: Cholelithiasis. Largest calculus 13 mm. Negative for gallbladder wall thickening. Negative for sonographic Murphy sign Common bile duct: Diameter: 7 mm Liver: Increased echogenicity of the liver without focal liver lesion. IMPRESSION: Cholelithiasis without evidence of cholecystitis Common bile duct upper normal Echogenic liver compatible with fatty infiltration. Electronically Signed   By: Marlan Palau M.D.   On: 09/04/2016 10:42    Microbiology: Recent Results (from the past 240 hour(s))  MRSA PCR Screening     Status: None   Collection Time: 09/04/16  4:53 PM  Result Value Ref Range Status   MRSA by PCR NEGATIVE NEGATIVE Final    Comment:        The GeneXpert MRSA Assay (FDA approved for NASAL specimens only), is one component of a comprehensive MRSA colonization surveillance program. It is not intended to diagnose MRSA infection nor to guide or monitor treatment for MRSA infections.   Surgical pcr screen     Status: None   Collection Time: 09/08/16  2:33 PM  Result  Value Ref Range Status   MRSA, PCR NEGATIVE NEGATIVE Final   Staphylococcus aureus NEGATIVE NEGATIVE Final    Comment:        The Xpert SA Assay (FDA approved for NASAL specimens in patients over 59 years of age), is one component of a comprehensive surveillance program.  Test performance has been validated by [facility] for patients greater than or equal to 51 year old. It is not intended to diagnose infection nor to guide or monitor treatment.      Labs: Basic Metabolic Panel:  Recent Labs Lab 09/08/16 0738 09/10/16 0814 09/11/16 0723 09/13/16 0425  NA 139  138 134* 135  K 4.0 3.6 3.4* 3.5  CL 104 103 100* 100*  CO2 24 23 25 26   GLUCOSE 104* 135* 130* 164*  BUN 17 14 11 13   CREATININE 1.16* 1.24* 1.23* 1.28*  CALCIUM 8.6* 8.2* 7.9* 7.9*   Liver Function Tests:  Recent Labs Lab 09/08/16 0738 09/10/16 0814 09/11/16 0723  AST 23 58* 53*  ALT 127* 58* 47  ALKPHOS 125 115 101  BILITOT 0.7 0.8 0.7  PROT 6.7 6.7 6.4*  ALBUMIN 2.4* 2.3* 2.0*   No results for input(s): LIPASE, AMYLASE in the last 168 hours. No results for input(s): AMMONIA in the last 168 hours. CBC:  Recent Labs Lab 09/08/16 0738 09/10/16 0814 09/11/16 0723 09/13/16 0425  WBC 9.3 14.7* 11.8* 11.1*  HGB 12.9 12.9 11.2* 10.6*  HCT 40.4 40.2 35.1* 33.6*  MCV 91.2 91.6 91.4 91.8  PLT 206 220 284 315   Cardiac Enzymes: No results for input(s): CKTOTAL, CKMB, CKMBINDEX, TROPONINI in the last 168 hours. BNP: BNP (last 3 results) No results for input(s): BNP in the last 8760 hours.  ProBNP (last 3 results) No results for input(s): PROBNP in the last 8760 hours.  CBG:  Recent Labs Lab 09/13/16 2055 09/14/16 0004 09/14/16 0436 09/14/16 0746 09/14/16 1144  GLUCAP 142* 172* 133* 105* 183*    Signed:  Penny Pia MD.  [facility] 09/14/2016, 12:02 PM

## 2016-09-24 LAB — CBC AND DIFFERENTIAL
HCT: 41 (ref 36–46)
Hemoglobin: 13.1 (ref 12.0–16.0)
Neutrophils Absolute: 5874
Platelets: 452 — AB (ref 150–399)
WBC: 8.9

## 2016-09-24 LAB — HEPATIC FUNCTION PANEL
ALT: 14 (ref 7–35)
AST: 17 (ref 13–35)
Alkaline Phosphatase: 98 (ref 25–125)
Bilirubin, Total: 0.4

## 2016-09-24 LAB — BASIC METABOLIC PANEL
BUN: 21 (ref 4–21)
Creatinine: 1 (ref 0.5–1.1)
Glucose: 137
Potassium: 4.3 (ref 3.4–5.3)
Sodium: 138 (ref 137–147)

## 2016-09-26 ENCOUNTER — Other Ambulatory Visit: Payer: Self-pay

## 2016-09-30 ENCOUNTER — Telehealth: Payer: Self-pay | Admitting: Internal Medicine

## 2016-09-30 NOTE — Telephone Encounter (Signed)
Recent hospitalization for gallstone pancreatitis reviewed. She is now s/p cholecystectomy .  outpatient CBC and CMEt received. Her kidney function is much better and nearly back to baseline   If blood sugars have been elevated without victoza ,  Make appt DO NOT RESUME VICTOZA

## 2016-10-01 NOTE — Telephone Encounter (Signed)
LMTCB

## 2016-10-02 NOTE — Telephone Encounter (Signed)
LMTCB

## 2016-10-03 ENCOUNTER — Ambulatory Visit: Payer: Medicare Other | Admitting: Internal Medicine

## 2016-10-10 NOTE — Telephone Encounter (Signed)
Spoke with pt and she stated that her sugars have running good without the Victoza. Pt stated that she has not been on the Victoza since her surgery. She also stated that she has not been on several of her medications(did not state which ones). Pt is to follow up with you next Wednesday but she stated that her daughter isn't able to bring her due to work. The pt is wanting to know if she could reschedule with you but the next thing you have available is the end of August other than your mondays evenings and the 11:30am or 4:30pm spots. Is the pt ok to wait until the end of August or would it be ok to reschedule her for one of the other spots.

## 2016-10-10 NOTE — Telephone Encounter (Signed)
Spoke with pt and she stated that she had her days mixed up and her daughter will be able to bring her to the appt next Wednesday.

## 2016-10-10 NOTE — Telephone Encounter (Signed)
If sugars are controlled  No reason to rush, august ok

## 2016-10-16 ENCOUNTER — Ambulatory Visit (INDEPENDENT_AMBULATORY_CARE_PROVIDER_SITE_OTHER): Payer: Medicare Other | Admitting: Internal Medicine

## 2016-10-16 ENCOUNTER — Encounter: Payer: Self-pay | Admitting: Internal Medicine

## 2016-10-16 VITALS — BP 118/70 | HR 87 | Temp 97.6°F | Resp 16 | Ht 65.0 in | Wt 304.1 lb

## 2016-10-16 DIAGNOSIS — M5441 Lumbago with sciatica, right side: Secondary | ICD-10-CM | POA: Diagnosis not present

## 2016-10-16 DIAGNOSIS — E039 Hypothyroidism, unspecified: Secondary | ICD-10-CM

## 2016-10-16 DIAGNOSIS — I10 Essential (primary) hypertension: Secondary | ICD-10-CM

## 2016-10-16 DIAGNOSIS — K8050 Calculus of bile duct without cholangitis or cholecystitis without obstruction: Secondary | ICD-10-CM

## 2016-10-16 DIAGNOSIS — G8929 Other chronic pain: Secondary | ICD-10-CM

## 2016-10-16 DIAGNOSIS — E782 Mixed hyperlipidemia: Secondary | ICD-10-CM

## 2016-10-16 DIAGNOSIS — Z79899 Other long term (current) drug therapy: Secondary | ICD-10-CM | POA: Diagnosis not present

## 2016-10-16 DIAGNOSIS — K851 Biliary acute pancreatitis without necrosis or infection: Secondary | ICD-10-CM

## 2016-10-16 DIAGNOSIS — N179 Acute kidney failure, unspecified: Secondary | ICD-10-CM

## 2016-10-16 DIAGNOSIS — E119 Type 2 diabetes mellitus without complications: Secondary | ICD-10-CM

## 2016-10-16 MED ORDER — NYSTATIN 100000 UNIT/GM EX CREA: 1.0000 "application " | TOPICAL_CREAM | Freq: Two times a day (BID) | CUTANEOUS | 2 refills | Status: DC

## 2016-10-16 MED ORDER — METHOCARBAMOL 500 MG PO TABS: 500.0000 mg | ORAL_TABLET | Freq: Four times a day (QID) | ORAL | 3 refills | Status: DC

## 2016-10-16 MED ORDER — TIZANIDINE HCL 4 MG PO CAPS: 4.0000 mg | ORAL_CAPSULE | Freq: Three times a day (TID) | ORAL | 2 refills | Status: DC

## 2016-10-16 MED ORDER — CELECOXIB 100 MG PO CAPS: 100.0000 mg | ORAL_CAPSULE | Freq: Two times a day (BID) | ORAL | 2 refills | Status: DC

## 2016-10-16 NOTE — Patient Instructions (Addendum)
You are doing well!  I agree that we do NOT need to resume cholesterol medication either  We will try to get PA for methocarbamol  Of the will not cover it, we can try getting Tizanidine or methocarbamol, whichever is cheaper or better tolerating using Good [facility]   For your allergies ,  You can use Benadryl but you should also consider adding one of these newer second generation antihistamines that are longer acting, non sedating and  available OTC:  Generic  Zyrtec, which is cetirizine.    generic Allegra , available generically as fexofenadine ; comes in 60 mg and 180 mg once daily strengths.    Generic Claritin :  also available as loratidine .   as

## 2016-10-16 NOTE — Progress Notes (Signed)
Subjective:  Patient ID: Bonnie Hinton, female    DOB: February 27, 1941  Age: 76 y.o. MRN: [id number]  CC: The primary encounter diagnosis was Long-term use of high-risk medication. Diagnoses of Acute kidney injury ([place]), Choledocholithiasis, Acquired hypothyroidism, Chronic right-sided low back pain with right-sided sciatica, Controlled type 2 diabetes mellitus without complication, without long-term current use of insulin ([place]), Gallstone pancreatitis, Essential hypertension, benign, and Mixed hyperlipidemia were also pertinent to this visit.  HPI Bonnie Hinton presents for FOLLOW UP ON CHRONIC ISSUES    Admitted to [facility] with gallstone pancreatitis on June 13,   Attributed to [place]. .  Surgery was on June 18    Went to rehab and most of her medications were stopped.  Her daughter has noticed an improved overall condition,  More motivated to take care of house,  Thinks it is the cholesterol medication  zetia and crestor,  Along with the opioids, which were stopped the day she left the hospital.    Has not filled the oxycodone rx for June.    10 lb wt loss  snce April visit   Using PT .  Walking 50 to 65 feet  And back.  Using a walker, but gets vertigo when she lies on left side.  [facility] cured vertigo in the past with 2 sessions,  But can't see them becaseu she is getting home PT. Doesn't want meclizine.   Has been having loose stools since the cholecystectomy.    Blood sugars 100 to 120 randomly  Needs refill on nystatin .  Taking celebrex   Lab Results  Component Value Date   TSH 1.461 09/04/2016     Lab Results  Component Value Date   HGBA1C 6.8 (H) 09/04/2016        Outpatient Medications Prior to Visit  Medication Sig Dispense Refill  . acetaminophen (TYLENOL) 325 MG tablet Take 2 tablets (650 mg total) by mouth every 6 (six) hours as needed for mild pain (or Fever >/= 101).    Marland Kitchen amLODipine (NORVASC) 5 MG tablet Take 1 tablet (5 mg total) by  mouth daily. 90 tablet 3  . cycloSPORINE (RESTASIS) 0.05 % ophthalmic emulsion Place 1 drop into both eyes 2 (two) times daily.    Marland Kitchen gabapentin (NEURONTIN) 300 MG capsule Take 1 capsule (300 mg total) by mouth 2 (two) times daily. 60 capsule 0  . levothyroxine (SYNTHROID, LEVOTHROID) 200 MCG tablet Take 1 tablet (200 mcg total) by mouth daily before breakfast. With 25 mcg daily  (total daily dose 225 mcg) 90 tablet 1  . LUMIGAN 0.01 % SOLN Place 1-2 drops into both eyes at bedtime.    . pantoprazole (PROTONIX) 40 MG tablet Take 1 tablet (40 mg total) by mouth daily. 90 tablet 3  . sertraline (ZOLOFT) 50 MG tablet Take 1 tablet (50 mg total) by mouth daily.    Marland Kitchen SYNTHROID 25 MCG tablet Take 25 mcg by mouth daily. Take with 264mg tablet to equal 2256m    . tolterodine (DETROL LA) 4 MG 24 hr capsule TAKE 1 CAPSULE BY MOUTH TWICE DAILY 60 capsule 3  . Vitamin D, Ergocalciferol, (DRISDOL) 50000 units CAPS capsule TAKE 1 CAPSULE (50,000 UNITS TOTAL) BY MOUTH ONCE A WEEK. (Patient taking differently: Take 50,000 Units by mouth every Sunday. ) 4 capsule 3   No facility-administered medications prior to visit.     Review of Systems;  Patient denies headache, fevers, malaise, unintentional weight loss, skin rash, eye pain, sinus congestion  and sinus pain, sore throat, dysphagia,  hemoptysis , cough, dyspnea, wheezing, chest pain, palpitations, orthopnea, edema, abdominal pain, nausea, melena, diarrhea, constipation, flank pain, dysuria, hematuria, urinary  Frequency, nocturia, numbness, tingling, seizures,  Focal weakness, Loss of consciousness,  Tremor, insomnia, depression, anxiety, and suicidal ideation.      Objective:  BP 118/70 (BP Location: Left Arm, Patient Position: Sitting, Cuff Size: Large)   Pulse 87   Temp 97.6 F (36.4 C) (Oral)   Resp 16   Ht 5' 5" (1.651 m)   Wt (!) 304 lb 1.9 oz (137.9 kg)   SpO2 96%   BMI 50.61 kg/m   BP Readings from Last 3 Encounters:  10/16/16 118/70    09/14/16 (!) 148/65  07/23/16 (!) 161/92    Wt Readings from Last 3 Encounters:  10/16/16 (!) 304 lb 1.9 oz (137.9 kg)  09/14/16 (!) 316 lb 3.2 oz (143.4 kg)  07/23/16 (!) 302 lb 9.6 oz (137.3 kg)    General appearance: alert, cooperative and appears stated age Ears: normal TM's and external ear canals both ears Throat: lips, mucosa, and tongue normal; teeth and gums normal Neck: no adenopathy, no carotid bruit, supple, symmetrical, trachea midline and thyroid not enlarged, symmetric, no tenderness/mass/nodules Back: symmetric, no curvature. ROM normal. No CVA tenderness. Lungs: clear to auscultation bilaterally Heart: regular rate and rhythm, S1, S2 normal, no murmur, click, rub or gallop Abdomen: soft, non-tender; bowel sounds normal; no masses,  no organomegaly Pulses: 2+ and symmetric Skin: Skin color, texture, turgor normal. No rashes or lesions Lymph nodes: Cervical, supraclavicular, and axillary nodes normal.  Lab Results  Component Value Date   HGBA1C 6.8 (H) 09/04/2016   HGBA1C 6.7 06/26/2016   HGBA1C 7.1 (H) 03/27/2016    Lab Results  Component Value Date   CREATININE 0.82 10/18/2016   CREATININE 1.0 09/24/2016   CREATININE 1.28 (H) 09/13/2016    Lab Results  Component Value Date   WBC 8.9 09/24/2016   HGB 13.1 09/24/2016   HCT 41 09/24/2016   PLT 452 (A) 09/24/2016   GLUCOSE 108 (H) 10/18/2016   CHOL 113 09/04/2016   TRIG 117 09/04/2016   HDL 71 09/04/2016   LDLDIRECT 50.0 03/27/2016   LDLCALC 19 09/04/2016   ALT 13 10/18/2016   AST 17 10/18/2016   NA 136 10/18/2016   K 4.2 10/18/2016   CL 101 10/18/2016   CREATININE 0.82 10/18/2016   BUN 19 10/18/2016   CO2 26 10/18/2016   TSH 1.461 09/04/2016   INR 1.37 09/04/2016   HGBA1C 6.8 (H) 09/04/2016   MICROALBUR 1.4 08/01/2015    Dg Chest 2 View  Result Date: 09/04/2016 CLINICAL DATA:  Preop chest exam, bronchitis. EXAM: CHEST  2 VIEW COMPARISON:  None. FINDINGS: The heart size and mediastinal  contours are within normal limits. Low lung volumes with mild increase in interstitial prominence suspicious for chronic bronchitic change. No pneumonic consolidation, effusion or pneumothorax. Mild degenerative change along the dorsal spine. IMPRESSION: Mild interstitial prominence bilaterally which may reflect chronic bronchitic change. No pulmonary consolidations to suggest pneumonia. Electronically Signed   By: Ashley Royalty M.D.   On: 09/04/2016 13:51   Mr Abdomen Mrcp Wo Contrast  Result Date: 09/04/2016 CLINICAL DATA:  Right upper quadrant abdominal pain. Pancreatitis with elevated liver function studies. EXAM: MRI ABDOMEN WITHOUT CONTRAST  (INCLUDING MRCP) TECHNIQUE: Multiplanar multisequence MR imaging of the abdomen was performed. Heavily T2-weighted images of the biliary and pancreatic ducts were obtained, and three-dimensional MRCP images were  rendered by post processing. COMPARISON:  Ultrasound today and 04/06/2016. FINDINGS: Despite efforts by the technologist and patient, moderate motion artifact is present on today's exam and could not be eliminated. This reduces exam sensitivity and specificity. Lower chest: The visualized lower chest appears remarkable. No significant pleural effusion. Hepatobiliary: The liver demonstrates loss of signal on the gradient echo opposed phase images consistent with steatosis. No focal lesions are identified. Multiple small gallstones are present. There is no gallbladder wall thickening or surrounding inflammation. The ERCP images are degraded by motion. No significant biliary dilatation. No definite choledocholithiasis. Pancreas: Fatty replaced with mild surrounding edema. No focal fluid collection or ductal dilatation identified. Spleen: Normal in size without focal abnormality. Adrenals/Urinary Tract: Both adrenal glands appear normal. Tiny cyst in the lower pole of the left kidney. The right kidney appears normal. No hydronephrosis. Stomach/Bowel: No evidence of  bowel wall thickening, distention or surrounding inflammatory change. Vascular/Lymphatic: Small lymph nodes in the porta hepatis, likely reactive. No enlarged retroperitoneal lymph nodes. No significant vascular findings on noncontrast imaging identified. Other: No ascites. Musculoskeletal: No acute or significant osseous findings. IMPRESSION: 1. Motion artifact results in suboptimal evaluation of the biliary system. Cholelithiasis without evidence of cholecystitis, significant biliary dilatation or definite choledocholithiasis. 2. Hepatic steatosis. 3. Peripancreatic edema consistent with pancreatitis. No focal fluid collection. Electronically Signed   By: Richardean Sale M.D.   On: 09/04/2016 13:53   Mr 3d Recon At Scanner  Result Date: 09/04/2016 CLINICAL DATA:  Right upper quadrant abdominal pain. Pancreatitis with elevated liver function studies. EXAM: MRI ABDOMEN WITHOUT CONTRAST  (INCLUDING MRCP) TECHNIQUE: Multiplanar multisequence MR imaging of the abdomen was performed. Heavily T2-weighted images of the biliary and pancreatic ducts were obtained, and three-dimensional MRCP images were rendered by post processing. COMPARISON:  Ultrasound today and 04/06/2016. FINDINGS: Despite efforts by the technologist and patient, moderate motion artifact is present on today's exam and could not be eliminated. This reduces exam sensitivity and specificity. Lower chest: The visualized lower chest appears remarkable. No significant pleural effusion. Hepatobiliary: The liver demonstrates loss of signal on the gradient echo opposed phase images consistent with steatosis. No focal lesions are identified. Multiple small gallstones are present. There is no gallbladder wall thickening or surrounding inflammation. The ERCP images are degraded by motion. No significant biliary dilatation. No definite choledocholithiasis. Pancreas: Fatty replaced with mild surrounding edema. No focal fluid collection or ductal dilatation  identified. Spleen: Normal in size without focal abnormality. Adrenals/Urinary Tract: Both adrenal glands appear normal. Tiny cyst in the lower pole of the left kidney. The right kidney appears normal. No hydronephrosis. Stomach/Bowel: No evidence of bowel wall thickening, distention or surrounding inflammatory change. Vascular/Lymphatic: Small lymph nodes in the porta hepatis, likely reactive. No enlarged retroperitoneal lymph nodes. No significant vascular findings on noncontrast imaging identified. Other: No ascites. Musculoskeletal: No acute or significant osseous findings. IMPRESSION: 1. Motion artifact results in suboptimal evaluation of the biliary system. Cholelithiasis without evidence of cholecystitis, significant biliary dilatation or definite choledocholithiasis. 2. Hepatic steatosis. 3. Peripancreatic edema consistent with pancreatitis. No focal fluid collection. Electronically Signed   By: Richardean Sale M.D.   On: 09/04/2016 13:53   US Abdomen Limited Ruq  Result Date: 09/04/2016 CLINICAL DATA:  Right upper quadrant pain EXAM: ULTRASOUND ABDOMEN LIMITED RIGHT UPPER QUADRANT COMPARISON:  Ultrasound abdomen 04/06/2016 FINDINGS: Gallbladder: Cholelithiasis. Largest calculus 13 mm. Negative for gallbladder wall thickening. Negative for sonographic Murphy sign Common bile duct: Diameter: 7 mm Liver: Increased echogenicity of the liver without  focal liver lesion. IMPRESSION: Cholelithiasis without evidence of cholecystitis Common bile duct upper normal Echogenic liver compatible with fatty infiltration. Electronically Signed   By: Franchot Gallo M.D.   On: 09/04/2016 10:42    Assessment & Plan:   Problem List Items Addressed This Visit    Acquired hypothyroidism    Thyroid function is WNL on current dose.  No current changes needed. Continue 225 mcg daily.  r  Lab Results  Component Value Date   TSH 1.461 09/04/2016         Acute kidney injury [facility])    Resolved since discharge  Lab  Results  Component Value Date   CREATININE 0.82 10/18/2016         Choledocholithiasis    Resolved with cholecystectomy done after patient was admitted with gallstone pancreatitis in June. .       Chronic low back pain with right-sided sciatica    She is now managing her pain without narcotics.       Relevant Medications   methocarbamol (ROBAXIN) 500 MG tablet   tiZANidine (ZANAFLEX) 4 MG capsule   celecoxib (CELEBREX) 100 MG capsule   Diabetes mellitus type 2, controlled ([place])     well-controlled on diet alone.  Victoza stopped during hospitalization for pancreatitis and suspected to be causative.   Patient is up-to-date on eye exams and foot exam is normal today.   Lab Results  Component Value Date   HGBA1C 6.8 (H) 09/04/2016   Lab Results  Component Value Date   MICROALBUR 1.4 08/01/2015         Essential hypertension, benign    Well controlled on current regimen. Renal function stable, no changes today.      Gallstone pancreatitis    Resolved, s/p cholecystectomy June 2018      Hyperlipidemia    She has stopped her statin and her pain has improved,  will repeat labs in 3 months.   Lab Results  Component Value Date   CHOL 113 09/04/2016   HDL 71 09/04/2016   LDLCALC 19 09/04/2016   LDLDIRECT 50.0 03/27/2016   TRIG 117 09/04/2016   CHOLHDL 1.6 09/04/2016          Other Visit Diagnoses    Long-term use of high-risk medication    -  Primary   Relevant Orders   Comp Met (CMET) (Completed)      I have changed Ms. Seib's celecoxib. I am also having her start on methocarbamol, tiZANidine, and nystatin cream. Additionally, I am having her maintain her pantoprazole, amLODipine, acetaminophen, gabapentin, cycloSPORINE, tolterodine, LUMIGAN, levothyroxine, Vitamin D (Ergocalciferol), SYNTHROID, and sertraline.  Meds ordered this encounter  Medications  . DISCONTD: celecoxib (CELEBREX) 200 MG capsule    Sig: Take 200 mg by mouth 2 (two) times daily.  .  methocarbamol (ROBAXIN) 500 MG tablet    Sig: Take 1 tablet (500 mg total) by mouth 4 (four) times daily.    Dispense:  90 tablet    Refill:  3  . tiZANidine (ZANAFLEX) 4 MG capsule    Sig: Take 1 capsule (4 mg total) by mouth 3 (three) times daily.    Dispense:  90 capsule    Refill:  2  . nystatin cream (MYCOSTATIN)    Sig: Apply 1 application topically 2 (two) times daily.    Dispense:  90 g    Refill:  2  . celecoxib (CELEBREX) 100 MG capsule    Sig: Take 1 capsule (100 mg total) by mouth 2 (  two) times daily.    Dispense:  180 capsule    Refill:  2    Medications Discontinued During This Encounter  Medication Reason  . celecoxib (CELEBREX) 200 MG capsule Reorder    Follow-up: No Follow-up on file.   Crecencio Mc, MD

## 2016-10-18 ENCOUNTER — Other Ambulatory Visit (INDEPENDENT_AMBULATORY_CARE_PROVIDER_SITE_OTHER): Payer: Medicare Other

## 2016-10-18 DIAGNOSIS — Z79899 Other long term (current) drug therapy: Secondary | ICD-10-CM | POA: Diagnosis not present

## 2016-10-18 LAB — COMPREHENSIVE METABOLIC PANEL
ALT: 13 U/L (ref 0–35)
AST: 17 U/L (ref 0–37)
Albumin: 3.9 g/dL (ref 3.5–5.2)
Alkaline Phosphatase: 72 U/L (ref 39–117)
BUN: 19 mg/dL (ref 6–23)
CO2: 26 mEq/L (ref 19–32)
Calcium: 9.6 mg/dL (ref 8.4–10.5)
Chloride: 101 mEq/L (ref 96–112)
Creatinine, Ser: 0.82 mg/dL (ref 0.40–1.20)
GFR: 72.08 mL/min (ref >60.00)
Glucose, Bld: 108 mg/dL — ABNORMAL HIGH (ref 70–99)
Potassium: 4.2 mEq/L (ref 3.5–5.1)
Sodium: 136 mEq/L (ref 135–145)
Total Bilirubin: 0.3 mg/dL (ref 0.2–1.2)
Total Protein: 7.7 g/dL (ref 6.0–8.3)

## 2016-10-18 NOTE — Assessment & Plan Note (Signed)
Resolved since discharge  Lab Results  Component Value Date   CREATININE 0.82 10/18/2016

## 2016-10-19 NOTE — Assessment & Plan Note (Signed)
Resolved with cholecystectomy done after patient was admitted with gallstone pancreatitis in June. .Marland Kitchen

## 2016-10-19 NOTE — Assessment & Plan Note (Signed)
Thyroid function is WNL on current dose.  No current changes needed. Continue 225 mcg daily.  r  Lab Results  Component Value Date   TSH 1.461 09/04/2016

## 2016-10-19 NOTE — Assessment & Plan Note (Signed)
well-controlled on diet alone.  Victoza stopped during hospitalization for pancreatitis and suspected to be causative.   Patient is up-to-date on eye exams and foot exam is normal today.   Lab Results  Component Value Date   HGBA1C 6.8 (H) 09/04/2016   Lab Results  Component Value Date   MICROALBUR 1.4 08/01/2015

## 2016-10-19 NOTE — Assessment & Plan Note (Signed)
She has stopped her statin and her pain has improved,  will repeat labs in 3 months.   Lab Results  Component Value Date   CHOL 113 09/04/2016   HDL 71 09/04/2016   LDLCALC 19 09/04/2016   LDLDIRECT 50.0 03/27/2016   TRIG 117 09/04/2016   CHOLHDL 1.6 09/04/2016

## 2016-10-19 NOTE — Assessment & Plan Note (Signed)
Well controlled on current regimen. Renal function stable, no changes today. 

## 2016-10-19 NOTE — Assessment & Plan Note (Signed)
Resolved, s/p cholecystectomy June 2018

## 2016-10-19 NOTE — Assessment & Plan Note (Signed)
She is now managing her pain without narcotics.

## 2016-10-22 ENCOUNTER — Telehealth: Payer: Self-pay | Admitting: Internal Medicine

## 2016-10-22 NOTE — Telephone Encounter (Signed)
Pt called back returning your call. Please advise, thank you!  Call pt @ [phone]

## 2016-10-23 NOTE — Telephone Encounter (Signed)
See result note.  

## 2016-11-11 ENCOUNTER — Ambulatory Visit (INDEPENDENT_AMBULATORY_CARE_PROVIDER_SITE_OTHER): Payer: Medicare Other | Admitting: Podiatry

## 2016-11-11 DIAGNOSIS — M79676 Pain in unspecified toe(s): Secondary | ICD-10-CM

## 2016-11-11 DIAGNOSIS — M2032 Hallux varus (acquired), left foot: Secondary | ICD-10-CM

## 2016-11-11 DIAGNOSIS — B351 Tinea unguium: Secondary | ICD-10-CM

## 2016-11-11 DIAGNOSIS — E1142 Type 2 diabetes mellitus with diabetic polyneuropathy: Secondary | ICD-10-CM

## 2016-11-11 DIAGNOSIS — M2042 Other hammer toe(s) (acquired), left foot: Secondary | ICD-10-CM

## 2016-11-11 NOTE — Progress Notes (Signed)
Subjective:     Patient ID: Bonnie Hinton, female   DOB: 1941-03-04, 76 y.o.   MRN: [id number]  HPI this patient presents the office with chief complaint of long painful ingrowing toenails on left  big toe. Patient states the nails are painful walking and wearing her shoes.   This patient is diabetic with neuropathy and is treated with gabapentin. She presents the office today for an evaluation and treatment of her painful big toenails.  She believes she may have ingrown toenail left big toenail.   Review of Systems     Objective:   Physical Exam GENERAL APPEARANCE: Alert, conversant. Appropriately groomed. No acute distress.  VASCULAR: Pedal pulses are  palpable at  [facility] and PT bilateral.  Capillary refill time is immediate to all digits,  Normal temperature gradient.  Digital hair growth is present bilateral  NEUROLOGIC: sensation is normal to 5.07 monofilament at 5/5 sites bilateral.  Light touch is intact bilateral, Muscle strength normal.  MUSCULOSKELETAL: acceptable muscle strength, tone and stability bilateral.  Intrinsic muscluature intact bilateral.  Contracted digits  B/L.  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    DERMATOLOGIC: skin color, texture, and turgor are within normal limits.  No preulcerative lesions or ulcers  are seen, no interdigital maceration noted.  No open lesions present.   No drainage noted. NAILS  Thick incurvated hallux toenails both feet.      Assessment:     Onychomycosis  B/L.  Diabetes with neuropathy.  Patient says the ingrown toenail pain may be due to her big toe rubbing second toe left.    Plan:     IE  Debride nails  RTC 3 months   Helane Gunther DPM    .

## 2016-11-15 ENCOUNTER — Telehealth: Payer: Self-pay | Admitting: *Deleted

## 2016-11-15 NOTE — Telephone Encounter (Signed)
Trey Paula from [facility] requested to extended time for home health with the frequency  2 times a week for 2 weeks  Pt contact [phone]

## 2016-11-18 NOTE — Telephone Encounter (Signed)
Spoke with Bonnie Hinton from [facility] and gave him the verbal orders for the extended home health 2 times a week for 2 weeks.

## 2016-12-25 ENCOUNTER — Other Ambulatory Visit: Payer: Self-pay

## 2016-12-25 ENCOUNTER — Ambulatory Visit (INDEPENDENT_AMBULATORY_CARE_PROVIDER_SITE_OTHER): Payer: Medicare Other | Admitting: Internal Medicine

## 2016-12-25 ENCOUNTER — Encounter: Payer: Self-pay | Admitting: Internal Medicine

## 2016-12-25 VITALS — BP 136/82 | HR 94 | Temp 98.0°F | Resp 16 | Ht 65.0 in | Wt 299.0 lb

## 2016-12-25 DIAGNOSIS — M5441 Lumbago with sciatica, right side: Secondary | ICD-10-CM

## 2016-12-25 DIAGNOSIS — E782 Mixed hyperlipidemia: Secondary | ICD-10-CM | POA: Diagnosis not present

## 2016-12-25 DIAGNOSIS — Z23 Encounter for immunization: Secondary | ICD-10-CM | POA: Diagnosis not present

## 2016-12-25 DIAGNOSIS — Z1211 Encounter for screening for malignant neoplasm of colon: Secondary | ICD-10-CM

## 2016-12-25 DIAGNOSIS — K851 Biliary acute pancreatitis without necrosis or infection: Secondary | ICD-10-CM | POA: Diagnosis not present

## 2016-12-25 DIAGNOSIS — F411 Generalized anxiety disorder: Secondary | ICD-10-CM | POA: Diagnosis not present

## 2016-12-25 DIAGNOSIS — E559 Vitamin D deficiency, unspecified: Secondary | ICD-10-CM | POA: Diagnosis not present

## 2016-12-25 DIAGNOSIS — Z96652 Presence of left artificial knee joint: Secondary | ICD-10-CM

## 2016-12-25 DIAGNOSIS — G8929 Other chronic pain: Secondary | ICD-10-CM | POA: Diagnosis not present

## 2016-12-25 DIAGNOSIS — E039 Hypothyroidism, unspecified: Secondary | ICD-10-CM

## 2016-12-25 DIAGNOSIS — E119 Type 2 diabetes mellitus without complications: Secondary | ICD-10-CM

## 2016-12-25 LAB — LIPID PANEL
Cholesterol: 291 mg/dL — ABNORMAL HIGH (ref 0–200)
HDL: 46.5 mg/dL (ref >39.00)
LDL Cholesterol: 210 mg/dL — ABNORMAL HIGH (ref 0–99)
NonHDL: 244.22
Total CHOL/HDL Ratio: 6
Triglycerides: 171 mg/dL — ABNORMAL HIGH (ref 0.0–149.0)
VLDL: 34.2 mg/dL (ref 0.0–40.0)

## 2016-12-25 LAB — COMPREHENSIVE METABOLIC PANEL
ALT: 13 U/L (ref 0–35)
AST: 17 U/L (ref 0–37)
Albumin: 4.1 g/dL (ref 3.5–5.2)
Alkaline Phosphatase: 84 U/L (ref 39–117)
BUN: 20 mg/dL (ref 6–23)
CO2: 27 mEq/L (ref 19–32)
Calcium: 9.7 mg/dL (ref 8.4–10.5)
Chloride: 102 mEq/L (ref 96–112)
Creatinine, Ser: 0.78 mg/dL (ref 0.40–1.20)
GFR: 76.33 mL/min (ref >60.00)
Glucose, Bld: 130 mg/dL — ABNORMAL HIGH (ref 70–99)
Potassium: 4.3 mEq/L (ref 3.5–5.1)
Sodium: 138 mEq/L (ref 135–145)
Total Bilirubin: 0.4 mg/dL (ref 0.2–1.2)
Total Protein: 7.9 g/dL (ref 6.0–8.3)

## 2016-12-25 LAB — VITAMIN D 25 HYDROXY (VIT D DEFICIENCY, FRACTURES): VITD: 25.21 ng/mL — ABNORMAL LOW (ref 30.00–100.00)

## 2016-12-25 LAB — TSH: TSH: <0.01 u[IU]/mL — ABNORMAL LOW (ref 0.35–4.50)

## 2016-12-25 LAB — HEMOGLOBIN A1C: Hgb A1c MFr Bld: 6.3 % (ref 4.6–6.5)

## 2016-12-25 MED ORDER — METHOCARBAMOL 500 MG PO TABS: 500.0000 mg | ORAL_TABLET | Freq: Four times a day (QID) | ORAL | 3 refills | Status: DC

## 2016-12-25 MED ORDER — TOLTERODINE TARTRATE ER 4 MG PO CP24: 4.0000 mg | ORAL_CAPSULE | Freq: Two times a day (BID) | ORAL | 1 refills | Status: DC

## 2016-12-25 MED ORDER — GABAPENTIN 300 MG PO CAPS: 300.0000 mg | ORAL_CAPSULE | Freq: Two times a day (BID) | ORAL | 1 refills | Status: DC

## 2016-12-25 MED ORDER — AMLODIPINE BESYLATE 5 MG PO TABS: 5.0000 mg | ORAL_TABLET | Freq: Every day | ORAL | 1 refills | Status: DC

## 2016-12-25 MED ORDER — NYSTATIN 100000 UNIT/GM EX CREA: 1.0000 "application " | TOPICAL_CREAM | Freq: Two times a day (BID) | CUTANEOUS | 2 refills | Status: DC

## 2016-12-25 MED ORDER — SERTRALINE HCL 50 MG PO TABS: 50.0000 mg | ORAL_TABLET | Freq: Every day | ORAL | 1 refills | Status: DC

## 2016-12-25 NOTE — Progress Notes (Signed)
Subjective:  Patient ID: Bonnie Hinton, female    DOB: [birth year]  Age: 76 y.o. MRN: [id number]  CC: The primary encounter diagnosis was Controlled type 2 diabetes mellitus without complication, without long-term current use of insulin (HCC). Diagnoses of Encounter for immunization, Vitamin D deficiency, Mixed hyperlipidemia, Acquired hypothyroidism, Status post left knee replacement, Pancreatitis, gallstone, Morbid obesity (HCC), Anxiety, generalized, Chronic right-sided low back pain with right-sided sciatica, and Screening for colon cancer were also pertinent to this visit.  HPI Bonnie Hinton presents for follow up on  multipoel chronic medical conditions including hypothyroidism hypertension,  GAD, severe obesity and chronic pain   Last seen July 25,   Thyroid was normalized on 225 mcg daily after  increasing dose in April for TSH of 13.  Since her last visit she has lost 7 lbs WITHOUT  VICTOZA which was implicated in her episode of gallstone pancreatitis in June.  She attributes the weight loss to following a "diet workshop" ( following a meal plan) . Marland Kitchen   Her mobility is improving with continued utilization of Home PT  Occurring 2/week.  Needs order to continue.  She continues to use a walker. Denies any recent  falls. Now one year since her knee replacement  Oct 2017.  Marland Kitchen  Sees orthopedics  tomorrow    She had  a rough week,  Her daughter Corrie Dandy) 's  beloved dog suffocated from getting his head stuck in a potato chip bag he grabbed off the counter while Corrie Dandy was away at work and she came home and found him dead.   The entire  family has been grieving the loss for over a week .   Lab Results  Component Value Date   TSH <0.01 Repeated and verified X2. (L) 12/25/2016    Needs refills on all med. Lab Results  Component Value Date   HGBA1C 6.3 12/25/2016   Lab Results  Component Value Date   WBC 8.9 09/24/2016   HGB 13.1 09/24/2016   HCT 41 09/24/2016   MCV 91.8 09/13/2016    PLT 452 (A) 09/24/2016    Lab Results  Component Value Date   CHOL 291 (H) 12/25/2016   HDL 46.50 12/25/2016   LDLCALC 210 (H) 12/25/2016   LDLDIRECT 50.0 03/27/2016   TRIG 171.0 (H) 12/25/2016   CHOLHDL 6 12/25/2016      Outpatient Medications Prior to Visit  Medication Sig Dispense Refill  . acetaminophen (TYLENOL) 325 MG tablet Take 2 tablets (650 mg total) by mouth every 6 (six) hours as needed for mild pain (or Fever >/= 101).    . celecoxib (CELEBREX) 100 MG capsule Take 1 capsule (100 mg total) by mouth 2 (two) times daily. 180 capsule 2  . cycloSPORINE (RESTASIS) 0.05 % ophthalmic emulsion Place 1 drop into both eyes 2 (two) times daily.    Marland Kitchen levothyroxine (SYNTHROID, LEVOTHROID) 200 MCG tablet Take 1 tablet (200 mcg total) by mouth daily before breakfast. With 25 mcg daily  (total daily dose 225 mcg) 90 tablet 1  . LUMIGAN 0.01 % SOLN Place 1-2 drops into both eyes at bedtime.    . pantoprazole (PROTONIX) 40 MG tablet Take 1 tablet (40 mg total) by mouth daily. 90 tablet 3  . SYNTHROID 25 MCG tablet Take 25 mcg by mouth daily. Take with tablet to equal    . tiZANidine (ZANAFLEX) 4 MG capsule Take 1 capsule (4 mg total) by mouth 3 (three) times daily. 90 capsule 2  .  Vitamin D, Ergocalciferol, (DRISDOL) 50000 units CAPS capsule TAKE 1 CAPSULE (50,000 UNITS TOTAL) BY MOUTH ONCE A WEEK. (Patient taking differently: Take 50,000 Units by mouth every Sunday. ) 4 capsule 3  . amLODipine (NORVASC) 5 MG tablet Take 1 tablet (5 mg total) by mouth daily. 90 tablet 3  . gabapentin (NEURONTIN) 300 MG capsule Take 1 capsule (300 mg total) by mouth 2 (two) times daily. 60 capsule 0  . methocarbamol (ROBAXIN) 500 MG tablet Take 1 tablet (500 mg total) by mouth 4 (four) times daily. 90 tablet 3  . nystatin cream (MYCOSTATIN) Apply 1 application topically 2 (two) times daily. 90 g 2  . sertraline (ZOLOFT) 50 MG tablet Take 1 tablet (50 mg total) by mouth daily.    Marland Kitchen tolterodine  (DETROL LA) 4 MG 24 hr capsule TAKE 1 CAPSULE BY MOUTH TWICE DAILY 60 capsule 3   No facility-administered medications prior to visit.     Review of Systems;  Patient denies headache, fevers, malaise, unintentional weight loss, skin rash, eye pain, sinus congestion and sinus pain, sore throat, dysphagia,  hemoptysis , cough, dyspnea, wheezing, chest pain, palpitations, orthopnea, edema, abdominal pain, nausea, melena, diarrhea, constipation, flank pain, dysuria, hematuria, urinary  Frequency, nocturia, numbness, tingling, seizures,  Focal weakness, Loss of consciousness,  Tremor, insomnia, depression, anxiety, and suicidal ideation.      Objective:  BP 136/82 (BP Location: Left Arm, Patient Position: Sitting, Cuff Size: Large)   Pulse 94   Temp 98 F (36.7 C) (Oral)   Resp 16   Ht  (1.651 m)   Wt 299 lb (135.6 kg)   SpO2 95%   BMI 49.76 kg/m   BP Readings from Last 3 Encounters:  12/25/16 136/82  10/16/16 118/70  09/14/16 (!) 148/65    Wt Readings from Last 3 Encounters:  12/25/16 299 lb (135.6 kg)  10/16/16 (!) 304 lb 1.9 oz (137.9 kg)  09/14/16 (!) 316 lb 3.2 oz (143.4 kg)    General appearance: alert, cooperative and appears stated age Ears: normal TM's and external ear canals both ears Throat: lips, mucosa, and tongue normal; teeth and gums normal Neck: no adenopathy, no carotid bruit, supple, symmetrical, trachea midline and thyroid not enlarged, symmetric, no tenderness/mass/nodules Back: symmetric, no curvature. ROM normal. No CVA tenderness. Lungs: clear to auscultation bilaterally Heart: regular rate and rhythm, S1, S2 normal, no murmur, click, rub or gallop Abdomen: soft, non-tender; bowel sounds normal; no masses,  no organomegaly Pulses: 2+ and symmetric Skin: Skin color, texture, turgor normal. No rashes or lesions Lymph nodes: Cervical, supraclavicular, and axillary nodes normal.  Lab Results  Component Value Date   HGBA1C 6.3 12/25/2016   HGBA1C  6.8 (H) 09/04/2016   HGBA1C 6.7 06/26/2016    Lab Results  Component Value Date   CREATININE 0.78 12/25/2016   CREATININE 0.82 10/18/2016   CREATININE 1.0 09/24/2016    Lab Results  Component Value Date   WBC 8.9 09/24/2016   HGB 13.1 09/24/2016   HCT 41 09/24/2016   PLT 452 (A) 09/24/2016   GLUCOSE 130 (H) 12/25/2016   CHOL 291 (H) 12/25/2016   TRIG 171.0 (H) 12/25/2016   HDL 46.50 12/25/2016   LDLDIRECT 50.0 03/27/2016   LDLCALC 210 (H) 12/25/2016   ALT 13 12/25/2016   AST 17 12/25/2016   NA 138 12/25/2016   K 4.3 12/25/2016   CL 102 12/25/2016   CREATININE 0.78 12/25/2016   BUN 20 12/25/2016   CO2 27 12/25/2016  TSH <0.01 Repeated and verified X2. (L) 12/25/2016   INR 1.37 09/04/2016   HGBA1C 6.3 12/25/2016   MICROALBUR 1.4 08/01/2015    Dg Chest 2 View  Result Date: 09/04/2016 CLINICAL DATA:  Preop chest exam, bronchitis. EXAM: CHEST  2 VIEW COMPARISON:  None. FINDINGS: The heart size and mediastinal contours are within normal limits. Low lung volumes with mild increase in interstitial prominence suspicious for chronic bronchitic change. No pneumonic consolidation, effusion or pneumothorax. Mild degenerative change along the dorsal spine. IMPRESSION: Mild interstitial prominence bilaterally which may reflect chronic bronchitic change. No pulmonary consolidations to suggest pneumonia. Electronically Signed   By: Tollie Eth M.D.   On: 09/04/2016 13:51   Mr Abdomen Mrcp Wo Contrast  Result Date: 09/04/2016 CLINICAL DATA:  Right upper quadrant abdominal pain. Pancreatitis with elevated liver function studies. EXAM: MRI ABDOMEN WITHOUT CONTRAST  (INCLUDING MRCP) TECHNIQUE: Multiplanar multisequence MR imaging of the abdomen was performed. Heavily T2-weighted images of the biliary and pancreatic ducts were obtained, and three-dimensional MRCP images were rendered by post processing. COMPARISON:  Ultrasound today and 04/06/2016. FINDINGS: Despite efforts by the technologist  and patient, moderate motion artifact is present on today's exam and could not be eliminated. This reduces exam sensitivity and specificity. Lower chest: The visualized lower chest appears remarkable. No significant pleural effusion. Hepatobiliary: The liver demonstrates loss of signal on the gradient echo opposed phase images consistent with steatosis. No focal lesions are identified. Multiple small gallstones are present. There is no gallbladder wall thickening or surrounding inflammation. The ERCP images are degraded by motion. No significant biliary dilatation. No definite choledocholithiasis. Pancreas: Fatty replaced with mild surrounding edema. No focal fluid collection or ductal dilatation identified. Spleen: Normal in size without focal abnormality. Adrenals/Urinary Tract: Both adrenal glands appear normal. Tiny cyst in the lower pole of the left kidney. The right kidney appears normal. No hydronephrosis. Stomach/Bowel: No evidence of bowel wall thickening, distention or surrounding inflammatory change. Vascular/Lymphatic: Small lymph nodes in the porta hepatis, likely reactive. No enlarged retroperitoneal lymph nodes. No significant vascular findings on noncontrast imaging identified. Other: No ascites. Musculoskeletal: No acute or significant osseous findings. IMPRESSION: 1. Motion artifact results in suboptimal evaluation of the biliary system. Cholelithiasis without evidence of cholecystitis, significant biliary dilatation or definite choledocholithiasis. 2. Hepatic steatosis. 3. Peripancreatic edema consistent with pancreatitis. No focal fluid collection. Electronically Signed   By: Carey Bullocks M.D.   On: 09/04/2016 13:53   Mr 3d Recon At Scanner  Result Date: 09/04/2016 CLINICAL DATA:  Right upper quadrant abdominal pain. Pancreatitis with elevated liver function studies. EXAM: MRI ABDOMEN WITHOUT CONTRAST  (INCLUDING MRCP) TECHNIQUE: Multiplanar multisequence MR imaging of the abdomen was  performed. Heavily T2-weighted images of the biliary and pancreatic ducts were obtained, and three-dimensional MRCP images were rendered by post processing. COMPARISON:  Ultrasound today and 04/06/2016. FINDINGS: Despite efforts by the technologist and patient, moderate motion artifact is present on today's exam and could not be eliminated. This reduces exam sensitivity and specificity. Lower chest: The visualized lower chest appears remarkable. No significant pleural effusion. Hepatobiliary: The liver demonstrates loss of signal on the gradient echo opposed phase images consistent with steatosis. No focal lesions are identified. Multiple small gallstones are present. There is no gallbladder wall thickening or surrounding inflammation. The ERCP images are degraded by motion. No significant biliary dilatation. No definite choledocholithiasis. Pancreas: Fatty replaced with mild surrounding edema. No focal fluid collection or ductal dilatation identified. Spleen: Normal in size without focal abnormality. Adrenals/Urinary  Tract: Both adrenal glands appear normal. Tiny cyst in the lower pole of the left kidney. The right kidney appears normal. No hydronephrosis. Stomach/Bowel: No evidence of bowel wall thickening, distention or surrounding inflammatory change. Vascular/Lymphatic: Small lymph nodes in the porta hepatis, likely reactive. No enlarged retroperitoneal lymph nodes. No significant vascular findings on noncontrast imaging identified. Other: No ascites. Musculoskeletal: No acute or significant osseous findings. IMPRESSION: 1. Motion artifact results in suboptimal evaluation of the biliary system. Cholelithiasis without evidence of cholecystitis, significant biliary dilatation or definite choledocholithiasis. 2. Hepatic steatosis. 3. Peripancreatic edema consistent with pancreatitis. No focal fluid collection. Electronically Signed   By: Carey Bullocks M.D.   On: 09/04/2016 13:53   US Abdomen Limited  Ruq  Result Date: 09/04/2016 CLINICAL DATA:  Right upper quadrant pain EXAM: ULTRASOUND ABDOMEN LIMITED RIGHT UPPER QUADRANT COMPARISON:  Ultrasound abdomen 04/06/2016 FINDINGS: Gallbladder: Cholelithiasis. Largest calculus 13 mm. Negative for gallbladder wall thickening. Negative for sonographic Murphy sign Common bile duct: Diameter: 7 mm Liver: Increased echogenicity of the liver without focal liver lesion. IMPRESSION: Cholelithiasis without evidence of cholecystitis Common bile duct upper normal Echogenic liver compatible with fatty infiltration. Electronically Signed   By: Marlan Palau M.D.   On: 09/04/2016 10:42    Assessment & Plan:   Problem List Items Addressed This Visit    Acquired hypothyroidism    Thyroid function is now VERY  overactive on current dose of 225 mcg daily.   I have lowered dose to 200 mcg  and would like patient to change immediately  Repeat tsh and Free T4 in 6 weeks       Relevant Orders   TSH (Completed)   T4   Anxiety, generalized    Now managed with zoloft only.        Chronic low back pain with right-sided sciatica    She is now managing her pain without narcotics.       Relevant Medications   methocarbamol (ROBAXIN) 500 MG tablet   Diabetes mellitus type 2, controlled (HCC) - Primary     well-controlled on diet alone.  Victoza stopped during hospitalization for pancreatitis and suspected to be causative.   Patient is up-to-date on eye exams and foot exam is normal today.   Lab Results  Component Value Date   HGBA1C 6.3 12/25/2016   Lab Results  Component Value Date   MICROALBUR 1.4 08/01/2015         Relevant Orders   Hemoglobin A1c (Completed)   Comprehensive metabolic panel (Completed)   Microalbumin / creatinine urine ratio   Hyperlipidemia   Relevant Orders   Lipid panel (Completed)   Morbid obesity (HCC)    I have congratulated her in reduction of   BMI and encouraged  Continued weight loss with goal of 10% of body weigh over  the next 6 months using a low glycemic index diet and regular exercise a minimum of 5 days per week.        Pancreatitis, gallstone    Vs adverse effect from use of Victoza.  She is s/p cholecystectomy  Lab Results  Component Value Date   ALT 13 12/25/2016   AST 17 12/25/2016   ALKPHOS 84 12/25/2016   BILITOT 0.4 12/25/2016    Lab Results  Component Value Date   LIPASE 56 (H) 09/07/2016         Screening for colon cancer    WILL RECOMMEND USE OF COLOGUARD PENDING CONFIRMATION OF AVERAGE RISK  Status post left knee replacement    Surgery was Oct 2017.  Slow recovery due to morbid obesity and chronic pain with narcotic dependence, now resolved.  I recommend that she continue home PT given her continued dependence on a walker      Vitamin D deficiency   Relevant Orders   VITAMIN D 25 Hydroxy (Vit-D Deficiency, Fractures) (Completed)    Other Visit Diagnoses    Encounter for immunization       Relevant Orders   Flu vaccine HIGH DOSE PF (Completed)      I have changed Ms. Germani's methocarbamol. I am also having her maintain her pantoprazole, acetaminophen, cycloSPORINE, LUMIGAN, levothyroxine, Vitamin D (Ergocalciferol), SYNTHROID, tiZANidine, and celecoxib.  Meds ordered this encounter  Medications  . methocarbamol (ROBAXIN) 500 MG tablet    Sig: Take 1 tablet (500 mg total) by mouth 4 (four) times daily. For muscle spasm    Dispense:  90 tablet    Refill:  3    Medications Discontinued During This Encounter  Medication Reason  . methocarbamol (ROBAXIN) 500 MG tablet Reorder    Follow-up: Return in about 6 months (around 06/25/2017).   Sherlene Shams, MD

## 2016-12-25 NOTE — Patient Instructions (Addendum)
Tell Bonnie Hinton  I am so sorry about her loss of Bonnie Hinton.    When she is is ready to find another companion for her other dog, tell her  about Bonnie Hinton, run by a lady named Bonnie Hinton up in [place] .  We have had 6 or 7 collies from her organization over the last 10 years and every one of them has been the best companions ever!   We are repeating all of your labs today so you do not have to return for 6 months (unless you want to contine 3 month follow up)  I am so glad you are still losing weight on your own!

## 2016-12-27 ENCOUNTER — Other Ambulatory Visit: Payer: Self-pay | Admitting: Internal Medicine

## 2016-12-27 NOTE — Assessment & Plan Note (Addendum)
Thyroid function is now VERY  overactive on current dose of 225 mcg daily.   I have lowered dose to 200 mcg  and would like patient to change immediately  Repeat tsh and Free T4 in 6 weeks

## 2016-12-28 ENCOUNTER — Encounter: Payer: Self-pay | Admitting: Internal Medicine

## 2016-12-28 DIAGNOSIS — Z1211 Encounter for screening for malignant neoplasm of colon: Secondary | ICD-10-CM | POA: Insufficient documentation

## 2016-12-28 NOTE — Assessment & Plan Note (Signed)
Vs adverse effect from use of Victoza.  She is s/p cholecystectomy  Lab Results  Component Value Date   ALT 13 12/25/2016   AST 17 12/25/2016   ALKPHOS 84 12/25/2016   BILITOT 0.4 12/25/2016    Lab Results  Component Value Date   LIPASE 56 (H) 09/07/2016

## 2016-12-28 NOTE — Assessment & Plan Note (Signed)
Now managed with zoloft only.

## 2016-12-28 NOTE — Assessment & Plan Note (Signed)
well-controlled on diet alone.  Victoza stopped during hospitalization for pancreatitis and suspected to be causative.   Patient is up-to-date on eye exams and foot exam is normal today.   Lab Results  Component Value Date   HGBA1C 6.3 12/25/2016   Lab Results  Component Value Date   MICROALBUR 1.4 08/01/2015

## 2016-12-28 NOTE — Assessment & Plan Note (Signed)
She is now managing her pain without narcotics.  

## 2016-12-28 NOTE — Assessment & Plan Note (Signed)
I have congratulated her in reduction of   BMI and encouraged  Continued weight loss with goal of 10% of body weigh over the next 6 months using a low glycemic index diet and regular exercise a minimum of 5 days per week.    

## 2016-12-28 NOTE — Assessment & Plan Note (Signed)
Surgery was Oct 2017.  Slow recovery due to morbid obesity and chronic pain with narcotic dependence, now resolved.  I recommend that she continue home PT given her continued dependence on a walker

## 2016-12-28 NOTE — Assessment & Plan Note (Signed)
WILL RECOMMEND USE OF COLOGUARD PENDING CONFIRMATION OF AVERAGE RISK

## 2017-01-01 ENCOUNTER — Telehealth: Payer: Self-pay | Admitting: Internal Medicine

## 2017-01-02 NOTE — Telephone Encounter (Signed)
Error

## 2017-01-31 ENCOUNTER — Other Ambulatory Visit: Payer: Self-pay | Admitting: Internal Medicine

## 2017-02-11 ENCOUNTER — Other Ambulatory Visit (INDEPENDENT_AMBULATORY_CARE_PROVIDER_SITE_OTHER): Payer: Medicare Other

## 2017-02-11 DIAGNOSIS — E039 Hypothyroidism, unspecified: Secondary | ICD-10-CM

## 2017-02-11 LAB — T4: T4, Total: 14.1 ug/dL — ABNORMAL HIGH (ref 5.1–11.9)

## 2017-02-17 ENCOUNTER — Encounter: Payer: Self-pay | Admitting: Podiatry

## 2017-02-17 ENCOUNTER — Ambulatory Visit: Payer: Medicare Other | Admitting: Podiatry

## 2017-02-17 DIAGNOSIS — M79676 Pain in unspecified toe(s): Secondary | ICD-10-CM | POA: Diagnosis not present

## 2017-02-17 DIAGNOSIS — E1142 Type 2 diabetes mellitus with diabetic polyneuropathy: Secondary | ICD-10-CM

## 2017-02-17 DIAGNOSIS — B351 Tinea unguium: Secondary | ICD-10-CM | POA: Diagnosis not present

## 2017-02-17 NOTE — Progress Notes (Addendum)
Subjective:     Patient ID: Bonnie Hinton, female   DOB: 07/18/1940, 76 y.o.   MRN: [id number]  Diabetes    this patient presents the office with chief complaint of long painful ingrowing toenails on left  big toe. Patient states the nails are painful walking and wearing her shoes.   This patient is diabetic with neuropathy and is treated with gabapentin. She presents the office today for an evaluation and treatment of her painful big toenails.  She believes she may have ingrown toenail left big toenail.   Review of Systems     Objective:   Physical Exam GENERAL APPEARANCE: Alert, conversant. Appropriately groomed. No acute distress.  VASCULAR: Pedal pulses are  palpable at  [facility] and PT bilateral.  Capillary refill time is immediate to all digits,  Normal temperature gradient.  Digital hair growth is present bilateral  NEUROLOGIC: sensation is normal to 5.07 monofilament at 5/5 sites bilateral.  Light touch is intact bilateral, Muscle strength normal.  MUSCULOSKELETAL: acceptable muscle strength, tone and stability bilateral.  Intrinsic muscluature intact bilateral.  Contracted digits  B/L.  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  Hammer toes 2,3 right   DERMATOLOGIC: skin color, texture, and turgor are within normal limits.  No preulcerative lesions or ulcers  are seen, no interdigital maceration noted.  No open lesions present.   No drainage noted. NAILS  Thick incurvated hallux toenails both feet.      Assessment:     Onychomycosis  B/L.  Diabetes with neuropathy.      Plan:     IE  Debride nails  RTC 3 months. Padding was dispensed for hammer toes right foot.   Helane GuntherGregory Babbie Dondlinger DPM    .

## 2017-02-25 ENCOUNTER — Other Ambulatory Visit: Payer: Self-pay

## 2017-02-25 MED ORDER — TIZANIDINE HCL 4 MG PO CAPS: 4.0000 mg | ORAL_CAPSULE | Freq: Three times a day (TID) | ORAL | 2 refills | Status: DC

## 2017-03-26 ENCOUNTER — Other Ambulatory Visit (INDEPENDENT_AMBULATORY_CARE_PROVIDER_SITE_OTHER): Payer: Medicare Other

## 2017-03-26 DIAGNOSIS — E039 Hypothyroidism, unspecified: Secondary | ICD-10-CM

## 2017-03-26 LAB — T4, FREE: Free T4: 0.21 ng/dL — ABNORMAL LOW (ref 0.60–1.60)

## 2017-03-26 LAB — TSH: TSH: 63.19 u[IU]/mL — ABNORMAL HIGH (ref 0.35–4.50)

## 2017-03-27 ENCOUNTER — Other Ambulatory Visit: Payer: Self-pay | Admitting: Internal Medicine

## 2017-03-27 DIAGNOSIS — E039 Hypothyroidism, unspecified: Secondary | ICD-10-CM

## 2017-03-27 MED ORDER — LEVOTHYROXINE SODIUM 50 MCG PO TABS: 50.0000 ug | ORAL_TABLET | Freq: Every day | ORAL | 0 refills | Status: DC

## 2017-04-02 ENCOUNTER — Other Ambulatory Visit: Payer: Self-pay | Admitting: Internal Medicine

## 2017-04-23 ENCOUNTER — Other Ambulatory Visit: Payer: Medicare Other

## 2017-04-24 ENCOUNTER — Other Ambulatory Visit (INDEPENDENT_AMBULATORY_CARE_PROVIDER_SITE_OTHER): Payer: Medicare Other

## 2017-04-24 DIAGNOSIS — E039 Hypothyroidism, unspecified: Secondary | ICD-10-CM

## 2017-04-24 DIAGNOSIS — E119 Type 2 diabetes mellitus without complications: Secondary | ICD-10-CM | POA: Diagnosis not present

## 2017-04-24 LAB — MICROALBUMIN / CREATININE URINE RATIO
Creatinine,U: 164.6 mg/dL
Microalb Creat Ratio: 2.1 mg/g (ref 0.0–30.0)
Microalb, Ur: 3.5 mg/dL — ABNORMAL HIGH (ref 0.0–1.9)

## 2017-04-24 LAB — TSH: TSH: 35.23 u[IU]/mL — ABNORMAL HIGH (ref 0.35–4.50)

## 2017-04-27 ENCOUNTER — Other Ambulatory Visit: Payer: Self-pay | Admitting: Internal Medicine

## 2017-04-27 DIAGNOSIS — E039 Hypothyroidism, unspecified: Secondary | ICD-10-CM

## 2017-04-27 MED ORDER — LEVOTHYROXINE SODIUM 75 MCG PO TABS: 75.0000 ug | ORAL_TABLET | Freq: Every day | ORAL | 0 refills | Status: DC

## 2017-04-27 NOTE — Assessment & Plan Note (Addendum)
TSH ELEVATED ON  50 MCG , INCREASE TO 75 MCG DAILY  Lab Results  Component Value Date   TSH 35.23 (H) 04/24/2017

## 2017-05-22 ENCOUNTER — Ambulatory Visit: Payer: Medicare Other | Admitting: Podiatry

## 2017-05-22 ENCOUNTER — Encounter: Payer: Self-pay | Admitting: Podiatry

## 2017-05-22 DIAGNOSIS — M2032 Hallux varus (acquired), left foot: Secondary | ICD-10-CM

## 2017-05-22 DIAGNOSIS — E1142 Type 2 diabetes mellitus with diabetic polyneuropathy: Secondary | ICD-10-CM

## 2017-05-22 DIAGNOSIS — B351 Tinea unguium: Secondary | ICD-10-CM

## 2017-05-22 DIAGNOSIS — M79676 Pain in unspecified toe(s): Secondary | ICD-10-CM | POA: Diagnosis not present

## 2017-05-22 DIAGNOSIS — M2042 Other hammer toe(s) (acquired), left foot: Secondary | ICD-10-CM

## 2017-05-22 NOTE — Progress Notes (Signed)
Complaint:  Visit Type: Patient returns to my office for continued preventative foot care services. Complaint: Patient states" my nails have grown long and thick and become painful to walk and wear shoes" Patient has been diagnosed with DM with neuropathy. The patient presents for preventative foot care services. No changes to ROS  Podiatric Exam: Vascular: dorsalis pedis and posterior tibial pulses are palpable bilateral. Capillary return is immediate. Temperature gradient is WNL. Skin turgor WNL  Sensorium: Normal Semmes Weinstein monofilament test. Normal tactile sensation bilaterally. Nail Exam: Pt has thick disfigured discolored nails with subungual debris noted bilateral entire nail hallux through fifth toenails Ulcer Exam: There is no evidence of ulcer or pre-ulcerative changes or infection. Orthopedic Exam: Muscle tone and strength are WNL. No limitations in general ROM. No crepitus or effusions noted. Contracted digits  B/L.   Severe PTTD left foot.  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  Hammer toes 2,3 right. Skin: No Porokeratosis. No infection or ulcers  Diagnosis:  Onychomycosis, , Pain in right toe, pain in left toes,  Diabetes with neuropathy.  Treatment & Plan Procedures and Treatment: Consent by patient was obtained for treatment procedures.   Debridement of mycotic and hypertrophic toenails, 1 through 5 bilateral and clearing of subungual debris. No ulceration, no infection noted.  Return Visit-Office Procedure: Patient instructed to return to the office for a follow up visit 3 months for continued evaluation and treatment.    Enis Leatherwood DPM 

## 2017-06-13 ENCOUNTER — Other Ambulatory Visit (INDEPENDENT_AMBULATORY_CARE_PROVIDER_SITE_OTHER): Payer: Medicare Other

## 2017-06-13 DIAGNOSIS — E039 Hypothyroidism, unspecified: Secondary | ICD-10-CM

## 2017-06-13 LAB — TSH: TSH: 22.62 u[IU]/mL — ABNORMAL HIGH (ref 0.35–4.50)

## 2017-06-14 ENCOUNTER — Other Ambulatory Visit: Payer: Self-pay | Admitting: Internal Medicine

## 2017-06-14 DIAGNOSIS — E118 Type 2 diabetes mellitus with unspecified complications: Secondary | ICD-10-CM

## 2017-06-14 DIAGNOSIS — E039 Hypothyroidism, unspecified: Secondary | ICD-10-CM

## 2017-06-14 MED ORDER — LEVOTHYROXINE SODIUM 88 MCG PO TABS
88.0000 ug | ORAL_TABLET | Freq: Every day | ORAL | 0 refills | Status: DC
Start: 2017-06-14 — End: 2017-08-03

## 2017-06-14 NOTE — Assessment & Plan Note (Signed)
Dose increased to 88 mcg for  Lab Results  Component Value Date   TSH 22.62 (H) 06/13/2017

## 2017-06-25 ENCOUNTER — Encounter: Payer: Self-pay | Admitting: Internal Medicine

## 2017-06-25 ENCOUNTER — Ambulatory Visit: Payer: Medicare Other | Admitting: Internal Medicine

## 2017-06-25 DIAGNOSIS — F411 Generalized anxiety disorder: Secondary | ICD-10-CM

## 2017-06-25 DIAGNOSIS — E118 Type 2 diabetes mellitus with unspecified complications: Secondary | ICD-10-CM | POA: Diagnosis not present

## 2017-06-25 DIAGNOSIS — B372 Candidiasis of skin and nail: Secondary | ICD-10-CM | POA: Diagnosis not present

## 2017-06-25 DIAGNOSIS — G8929 Other chronic pain: Secondary | ICD-10-CM | POA: Diagnosis not present

## 2017-06-25 DIAGNOSIS — N3946 Mixed incontinence: Secondary | ICD-10-CM

## 2017-06-25 DIAGNOSIS — E1121 Type 2 diabetes mellitus with diabetic nephropathy: Secondary | ICD-10-CM | POA: Diagnosis not present

## 2017-06-25 DIAGNOSIS — E039 Hypothyroidism, unspecified: Secondary | ICD-10-CM | POA: Diagnosis not present

## 2017-06-25 DIAGNOSIS — K580 Irritable bowel syndrome with diarrhea: Secondary | ICD-10-CM | POA: Diagnosis not present

## 2017-06-25 DIAGNOSIS — M5441 Lumbago with sciatica, right side: Secondary | ICD-10-CM

## 2017-06-25 DIAGNOSIS — I10 Essential (primary) hypertension: Secondary | ICD-10-CM | POA: Diagnosis not present

## 2017-06-25 MED ORDER — TIZANIDINE HCL 4 MG PO CAPS: 4.0000 mg | ORAL_CAPSULE | Freq: Three times a day (TID) | ORAL | 3 refills | Status: DC | PRN

## 2017-06-25 MED ORDER — DICYCLOMINE HCL 20 MG PO TABS: 20.0000 mg | ORAL_TABLET | Freq: Three times a day (TID) | ORAL | 0 refills | Status: DC

## 2017-06-25 MED ORDER — TOLTERODINE TARTRATE ER 4 MG PO CP24: 4.0000 mg | ORAL_CAPSULE | Freq: Three times a day (TID) | ORAL | 0 refills | Status: DC

## 2017-06-25 MED ORDER — CELECOXIB 200 MG PO CAPS: 200.0000 mg | ORAL_CAPSULE | Freq: Two times a day (BID) | ORAL | 0 refills | Status: DC

## 2017-06-25 NOTE — Progress Notes (Signed)
Subjective:  Patient ID: Bonnie Hinton, female    DOB: 11-Aug-1940  Age: 77 y.o. MRN: [id number]  CC: Diagnoses of Diabetes mellitus with complication (HCC), Acquired hypothyroidism, Anxiety, generalized, Diabetic nephropathy associated with type 2 diabetes mellitus (HCC), Mixed stress and urge urinary incontinence, Candidiasis of skin, Essential hypertension, benign, Irritable bowel syndrome with diarrhea, and Chronic right-sided low back pain with right-sided sciatica were pertinent to this visit. bowels HPI Bonnie Hinton presents for 6  month follow up on Type 2  diabetes, hypothyroidism  And morbid obesity.    Patient has multiple issues today to discuss,    1) concern about weight gain. .  Patient is following a low glycemic index diet for control of his DM and taking all prescribed medications regularly without side effects.  Does not check her  blood sugars except on rare occasions, when she feels poorly  Sugars have been over 100 and less than 160  .  Patient is not exercising but [place] with her walker at home every hour and  intentionally trying to lose weight .  Patient has had an eye exam in the last 12 months  And has been told she has cataracts with follow up in May.  She  checks feet regularly for signs of infection.  Patient does not walk barefoot outside.  taking Victoza once in the morning at the starting dose .   Marland Kitchen  Has been having trouble with her teeth; needs cavity filled  Recurrent candidiasis under abdominal skin folds.   Seasonal allergies, eyes itching   bowels still overactive postprandially since her cholecystectomy.  Urgency and loose   Taking celebrex daily for joint pain ,  Not taking protonix daily   Doesn't think she needs to be on zoloft for anxiety anymore.  Discussed tapering dose    Lab Results  Component Value Date   HGBA1C 6.3 12/25/2016   Patient is up to date on all recommended vaccinations.   Hypothyroid,  : in July 2018 tsh was  normal on daily dose of 225 mcg,  But very overactive I n October 2018  (with wt loss noted) .   DOSE WAS REDUCED  TO 200 MCG BUT SHE continued to take the higher dose due to preferring the welcomed weight loss, so refill was denied and medication was restarted at 50  mcg in Feb . Has gained 33 lbs since last visit  Due to underactive thyroid,  Dose was increased to 88 mcg  On March 22 .    Taking celebrex bid,   Taking detrol LA  8 mg bid.  Denies any side effects from excessive dosing    Not tolerating salads since cholelcsytectomy.  Due to loose stools  With urgency     Outpatient Medications Prior to Visit  Medication Sig Dispense Refill  . acetaminophen (TYLENOL) 325 MG tablet Take 2 tablets (650 mg total) by mouth every 6 (six) hours as needed for mild pain (or Fever >/= 101).    Marland Kitchen amLODipine (NORVASC) 5 MG tablet Take 1 tablet (5 mg total) by mouth daily. 90 tablet 1  . Cholecalciferol (VITAMIN D3) 2000 units TABS Take 1 tablet by mouth daily.    . cycloSPORINE (RESTASIS) 0.05 % ophthalmic emulsion Place 1 drop into both eyes 2 (two) times daily.    Marland Kitchen levothyroxine (SYNTHROID, LEVOTHROID) 88 MCG tablet Take 1 tablet (88 mcg total) by mouth daily before breakfast. 90 tablet 0  . LUMIGAN 0.01 % SOLN Place 1-2  drops into both eyes at bedtime.    Marland Kitchen. nystatin cream (MYCOSTATIN) Apply 1 application topically 2 (two) times daily. 90 g 2  . pantoprazole (PROTONIX) 40 MG tablet TAKE 1 TABLET DAILY 90 tablet 3  . celecoxib (CELEBREX) 200 MG capsule TAKE 1 CAPSULE (200 MG TOTAL) BY MOUTH DAILY. (Patient taking differently: Take 200 mg by mouth 2 (two) times daily. ) 90 capsule 1  . gabapentin (NEURONTIN) 300 MG capsule Take 1 capsule (300 mg total) by mouth 2 (two) times daily. 180 capsule 1  . liraglutide 18 MG/3ML SOPN Inject 0.6 mg into the skin.    . methocarbamol (ROBAXIN) 500 MG tablet Take 1 tablet (500 mg total) by mouth 4 (four) times daily. For muscle spasm 90 tablet 3  . sertraline  (ZOLOFT) 50 MG tablet Take 1 tablet (50 mg total) by mouth daily. 90 tablet 1  . tiZANidine (ZANAFLEX) 4 MG capsule Take 1 capsule (4 mg total) by mouth 3 (three) times daily. 90 capsule 2  . tolterodine (DETROL LA) 4 MG 24 hr capsule Take 1 capsule (4 mg total) by mouth 2 (two) times daily. (Patient taking differently: Take 4 mg by mouth 2 (two) times daily. ) 180 capsule 1  . Vitamin D, Ergocalciferol, (DRISDOL) 50000 units CAPS capsule TAKE 1 CAPSULE (50,000 UNITS TOTAL) BY MOUTH ONCE A WEEK. (Patient not taking: Reported on 06/25/2017) 4 capsule 3   No facility-administered medications prior to visit.     Review of Systems;  Patient denies headache, fevers, malaise, unintentional weight loss, skin rash, eye pain, sinus congestion and sinus pain, sore throat, dysphagia,  hemoptysis , cough, dyspnea, wheezing, chest pain, palpitations, orthopnea, edema, abdominal pain, nausea, melena, diarrhea, constipation, flank pain, dysuria, hematuria, urinary  Frequency, nocturia, numbness, tingling, seizures,  Focal weakness, Loss of consciousness,  Tremor, insomnia, depression, anxiety, and suicidal ideation.      Objective:  BP 140/66 (BP Location: Left Arm, Patient Position: Sitting, Cuff Size: Large)   Pulse 87   Temp (!) 97.5 F (36.4 C) (Oral)   Resp 16   Ht 5\' 5"  (1.651 m)   Wt (!) 332 lb 12.8 oz (151 kg)   SpO2 97%   BMI 55.38 kg/m   BP Readings from Last 3 Encounters:  06/25/17 140/66  12/25/16 136/82  10/16/16 118/70    Wt Readings from Last 3 Encounters:  06/25/17 (!) 332 lb 12.8 oz (151 kg)  12/25/16 299 lb (135.6 kg)  10/16/16 (!) 304 lb 1.9 oz (137.9 kg)    General appearance: alert, cooperative and appears stated age Ears: normal TM's and external ear canals both ears Throat: lips, mucosa, and tongue normal; teeth and gums normal Neck: no adenopathy, no carotid bruit, supple, symmetrical, trachea midline and thyroid not enlarged, symmetric, no  tenderness/mass/nodules Back: symmetric, no curvature. ROM normal. No CVA tenderness. Lungs: clear to auscultation bilaterally Heart: regular rate and rhythm, S1, S2 normal, no murmur, click, rub or gallop Abdomen: soft, non-tender; bowel sounds normal; no masses,  no organomegaly Pulses: 2+ and symmetric Skin: Skin color, texture, turgor normal. No rashes or lesions Lymph nodes: Cervical, supraclavicular, and axillary nodes normal.  Lab Results  Component Value Date   HGBA1C 6.3 12/25/2016   HGBA1C 6.8 (H) 09/04/2016   HGBA1C 6.7 06/26/2016    Lab Results  Component Value Date   CREATININE 0.78 12/25/2016   CREATININE 0.82 10/18/2016   CREATININE 1.0 09/24/2016    Lab Results  Component Value Date   WBC 8.9  09/24/2016   HGB 13.1 09/24/2016   HCT 41 09/24/2016   PLT 452 (A) 09/24/2016   GLUCOSE 130 (H) 12/25/2016   CHOL 291 (H) 12/25/2016   TRIG 171.0 (H) 12/25/2016   HDL 46.50 12/25/2016   LDLDIRECT 50.0 03/27/2016   LDLCALC 210 (H) 12/25/2016   ALT 13 12/25/2016   AST 17 12/25/2016   NA 138 12/25/2016   K 4.3 12/25/2016   CL 102 12/25/2016   CREATININE 0.78 12/25/2016   BUN 20 12/25/2016   CO2 27 12/25/2016   TSH 22.62 (H) 06/13/2017   INR 1.37 09/04/2016   HGBA1C 6.3 12/25/2016   MICROALBUR 3.5 (H) 04/24/2017    Dg Chest 2 View  Result Date: 09/04/2016 CLINICAL DATA:  Preop chest exam, bronchitis. EXAM: CHEST  2 VIEW COMPARISON:  None. FINDINGS: The heart size and mediastinal contours are within normal limits. Low lung volumes with mild increase in interstitial prominence suspicious for chronic bronchitic change. No pneumonic consolidation, effusion or pneumothorax. Mild degenerative change along the dorsal spine. IMPRESSION: Mild interstitial prominence bilaterally which may reflect chronic bronchitic change. No pulmonary consolidations to suggest pneumonia. Electronically Signed   By: Tollie Eth M.D.   On: 09/04/2016 13:51   Mr Abdomen Mrcp Wo  Contrast  Result Date: 09/04/2016 CLINICAL DATA:  Right upper quadrant abdominal pain. Pancreatitis with elevated liver function studies. EXAM: MRI ABDOMEN WITHOUT CONTRAST  (INCLUDING MRCP) TECHNIQUE: Multiplanar multisequence MR imaging of the abdomen was performed. Heavily T2-weighted images of the biliary and pancreatic ducts were obtained, and three-dimensional MRCP images were rendered by post processing. COMPARISON:  Ultrasound today and 04/06/2016. FINDINGS: Despite efforts by the technologist and patient, moderate motion artifact is present on today's exam and could not be eliminated. This reduces exam sensitivity and specificity. Lower chest: The visualized lower chest appears remarkable. No significant pleural effusion. Hepatobiliary: The liver demonstrates loss of signal on the gradient echo opposed phase images consistent with steatosis. No focal lesions are identified. Multiple small gallstones are present. There is no gallbladder wall thickening or surrounding inflammation. The ERCP images are degraded by motion. No significant biliary dilatation. No definite choledocholithiasis. Pancreas: Fatty replaced with mild surrounding edema. No focal fluid collection or ductal dilatation identified. Spleen: Normal in size without focal abnormality. Adrenals/Urinary Tract: Both adrenal glands appear normal. Tiny cyst in the lower pole of the left kidney. The right kidney appears normal. No hydronephrosis. Stomach/Bowel: No evidence of bowel wall thickening, distention or surrounding inflammatory change. Vascular/Lymphatic: Small lymph nodes in the porta hepatis, likely reactive. No enlarged retroperitoneal lymph nodes. No significant vascular findings on noncontrast imaging identified. Other: No ascites. Musculoskeletal: No acute or significant osseous findings. IMPRESSION: 1. Motion artifact results in suboptimal evaluation of the biliary system. Cholelithiasis without evidence of cholecystitis, significant  biliary dilatation or definite choledocholithiasis. 2. Hepatic steatosis. 3. Peripancreatic edema consistent with pancreatitis. No focal fluid collection. Electronically Signed   By: Carey Bullocks M.D.   On: 09/04/2016 13:53   Mr 3d Recon At Scanner  Result Date: 09/04/2016 CLINICAL DATA:  Right upper quadrant abdominal pain. Pancreatitis with elevated liver function studies. EXAM: MRI ABDOMEN WITHOUT CONTRAST  (INCLUDING MRCP) TECHNIQUE: Multiplanar multisequence MR imaging of the abdomen was performed. Heavily T2-weighted images of the biliary and pancreatic ducts were obtained, and three-dimensional MRCP images were rendered by post processing. COMPARISON:  Ultrasound today and 04/06/2016. FINDINGS: Despite efforts by the technologist and patient, moderate motion artifact is present on today's exam and could not be eliminated. This  reduces exam sensitivity and specificity. Lower chest: The visualized lower chest appears remarkable. No significant pleural effusion. Hepatobiliary: The liver demonstrates loss of signal on the gradient echo opposed phase images consistent with steatosis. No focal lesions are identified. Multiple small gallstones are present. There is no gallbladder wall thickening or surrounding inflammation. The ERCP images are degraded by motion. No significant biliary dilatation. No definite choledocholithiasis. Pancreas: Fatty replaced with mild surrounding edema. No focal fluid collection or ductal dilatation identified. Spleen: Normal in size without focal abnormality. Adrenals/Urinary Tract: Both adrenal glands appear normal. Tiny cyst in the lower pole of the left kidney. The right kidney appears normal. No hydronephrosis. Stomach/Bowel: No evidence of bowel wall thickening, distention or surrounding inflammatory change. Vascular/Lymphatic: Small lymph nodes in the porta hepatis, likely reactive. No enlarged retroperitoneal lymph nodes. No significant vascular findings on noncontrast  imaging identified. Other: No ascites. Musculoskeletal: No acute or significant osseous findings. IMPRESSION: 1. Motion artifact results in suboptimal evaluation of the biliary system. Cholelithiasis without evidence of cholecystitis, significant biliary dilatation or definite choledocholithiasis. 2. Hepatic steatosis. 3. Peripancreatic edema consistent with pancreatitis. No focal fluid collection. Electronically Signed   By: Carey Bullocks M.D.   On: 09/04/2016 13:53   US Abdomen Limited Ruq  Result Date: 09/04/2016 CLINICAL DATA:  Right upper quadrant pain EXAM: ULTRASOUND ABDOMEN LIMITED RIGHT UPPER QUADRANT COMPARISON:  Ultrasound abdomen 04/06/2016 FINDINGS: Gallbladder: Cholelithiasis. Largest calculus 13 mm. Negative for gallbladder wall thickening. Negative for sonographic Murphy sign Common bile duct: Diameter: 7 mm Liver: Increased echogenicity of the liver without focal liver lesion. IMPRESSION: Cholelithiasis without evidence of cholecystitis Common bile duct upper normal Echogenic liver compatible with fatty infiltration. Electronically Signed   By: Marlan Palau M.D.   On: 09/04/2016 10:42    Assessment & Plan:   Problem List Items Addressed This Visit    Mixed stress and urge urinary incontinence    She has been taking excessive doses of Detrol LA  (16 mg daily) despite recommendations to reduce dose because lower doses were not effective.  Reducing dose to 12 mg daily       Relevant Medications   tolterodine (DETROL LA) 4 MG 24 hr capsule   IBS (irritable bowel syndrome)    Post cholecystectomy.  Trial  of bentyl       Relevant Medications   dicyclomine (BENTYL) 20 MG tablet   Essential hypertension, benign    Control is at upper limits of normal on amlodipine.  ACE Inhibitor was stopped in 2017 due to AKI and no she nenver had the rule out for RAS. Will consider adding Toprol xl at next visit  Lab Results  Component Value Date   CREATININE 0.78 12/25/2016   Lab Results   Component Value Date   NA 138 12/25/2016   K 4.3 12/25/2016   CL 102 12/25/2016   CO2 27 12/25/2016   Lab Results  Component Value Date   MICROALBUR 3.5 (H) 04/24/2017         Diabetic nephropathy associated with type 2 diabetes mellitus (HCC)     well-controlled on diet alone.  Victoza stopped during hospitalization for pancreatitis and suspected to be causative.   Patient is up-to-date on eye exams and foot exam is normal today. lisinopril stopped in 2017 during acute kidney injruy and Cr normalized after cessation  Renal ultrasound was ordered to rule out RAS, but was never done    Lab Results  Component Value Date   HGBA1C 6.3 12/25/2016  Lab Results  Component Value Date   MICROALBUR 3.5 (H) 04/24/2017         Diabetes mellitus with complication (HCC)     well-controlled on diet alone.  Victoza was stopped during recent hospitalization for pancreatitis and suspected to be causative.   Patient is up-to-date on eye exams and foot exam is normal today.   Lab Results  Component Value Date   HGBA1C 6.3 12/25/2016   Lab Results  Component Value Date   MICROALBUR 3.5 (H) 04/24/2017         Chronic low back pain with right-sided sciatica    She is now managing her pain without narcotics and using celebrex bid.  Cautioned to resume protonix daily       Relevant Medications   celecoxib (CELEBREX) 200 MG capsule   tiZANidine (ZANAFLEX) 4 MG capsule   Candidiasis of skin    Nystatin powder in abdominal folds.  Advised to avoid use near vaginal folds.       Anxiety, generalized    Improved with recovery from multiple surgeries.   Discussed zoloft wean.       Acquired hypothyroidism    Dose increased to 88 mcg for  TSH of 22.  Will increase to 112 at next check if TSH > 10 Lab Results  Component Value Date   TSH 22.62 (H) 06/13/2017            I have discontinued Lanora Manis A. Hammill's Vitamin D (Ergocalciferol), methocarbamol, gabapentin, sertraline,  and liraglutide. I have also changed her tolterodine, celecoxib, and tiZANidine. Additionally, I am having her start on dicyclomine. Lastly, I am having her maintain her acetaminophen, cycloSPORINE, LUMIGAN, amLODipine, nystatin cream, pantoprazole, levothyroxine, and Vitamin D3.  Meds ordered this encounter  Medications  . dicyclomine (BENTYL) 20 MG tablet    Sig: Take 1 tablet (20 mg total) by mouth 4 (four) times daily -  before meals and at bedtime.    Dispense:  270 tablet    Refill:  0  . tolterodine (DETROL LA) 4 MG 24 hr capsule    Sig: Take 1 capsule (4 mg total) by mouth 3 (three) times daily.    Dispense:  270 capsule    Refill:  0  . celecoxib (CELEBREX) 200 MG capsule    Sig: Take 1 capsule (200 mg total) by mouth 2 (two) times daily.    Dispense:  180 capsule    Refill:  0  . tiZANidine (ZANAFLEX) 4 MG capsule    Sig: Take 1 capsule (4 mg total) by mouth 3 (three) times daily as needed for muscle spasms.    Dispense:  90 capsule    Refill:  3    Medications Discontinued During This Encounter  Medication Reason  . Vitamin D, Ergocalciferol, (DRISDOL) 50000 units CAPS capsule Completed Course  . gabapentin (NEURONTIN) 300 MG capsule   . sertraline (ZOLOFT) 50 MG tablet   . tolterodine (DETROL LA) 4 MG 24 hr capsule Reorder  . celecoxib (CELEBREX) 200 MG capsule   . methocarbamol (ROBAXIN) 500 MG tablet   . tiZANidine (ZANAFLEX) 4 MG capsule Reorder  . liraglutide 18 MG/3ML SOPN Side effect (s)    Follow-up: No follow-ups on file.   Sherlene Shams, MD

## 2017-06-25 NOTE — Patient Instructions (Addendum)
Try gold bond medicated powder with zinc and stuff a cotton layer between folds of skin   To prevent recurrrent yeast infections in the abdominal skin folds   You should take your medications before you come in for fasting labs   Reduce the zoloft to 1/2 tablet daily for one week,  Then 1/2 tablet every other day for one week,  Then stop     You eye itching may be due to seasonal allergies.  For your allergies ,  consider adding one of these newer second generation antihistamines that are longer acting, non sedating and  available OTC:  Generic  Zyrtec, which is cetirizine.    generic Allegra , available generically as fexofenadine ; comes in 60 mg and 180 mg once daily strengths.    Generic Claritin :  also available as loratidine .    You are going to reduce your dose of  detrol LA to 3 capsules daily  I am ADDING dicyclomine 20 mg tor irritable bowels.  Take it 30 minutes before any meal that usually results in loose stools    You should be taking Protonix EVERY Day  Because you are taking celebrex, and the risk of gastritis is HIGHER   Take the thyroid medication FIRST IN THE MORNING   30 TO 40 MINUTES BEFORE ANYTHING ELSE  (AS EARLY AS POSSIBLE)

## 2017-06-28 ENCOUNTER — Encounter: Payer: Self-pay | Admitting: Internal Medicine

## 2017-06-28 DIAGNOSIS — K589 Irritable bowel syndrome without diarrhea: Secondary | ICD-10-CM | POA: Insufficient documentation

## 2017-06-28 DIAGNOSIS — B372 Candidiasis of skin and nail: Secondary | ICD-10-CM | POA: Insufficient documentation

## 2017-06-28 NOTE — Assessment & Plan Note (Addendum)
well-controlled on diet alone.  Victoza stopped during hospitalization for pancreatitis and suspected to be causative.   Patient is up-to-date on eye exams and foot exam is normal today. lisinopril stopped in 2017 during acute kidney injruy and Cr normalized after cessation  Renal ultrasound was ordered to rule out RAS, but was never done    Lab Results  Component Value Date   HGBA1C 6.3 12/25/2016   Lab Results  Component Value Date   MICROALBUR 3.5 (H) 04/24/2017

## 2017-06-28 NOTE — Assessment & Plan Note (Signed)
She is now managing her pain without narcotics and using celebrex bid.  Cautioned to resume protonix daily

## 2017-06-28 NOTE — Assessment & Plan Note (Addendum)
Control is at upper limits of normal on amlodipine.  ACE Inhibitor was stopped in 2017 due to AKI and no she nenver had the rule out for RAS. Will consider adding Toprol xl at next visit  Lab Results  Component Value Date   CREATININE 0.78 12/25/2016   Lab Results  Component Value Date   NA 138 12/25/2016   K 4.3 12/25/2016   CL 102 12/25/2016   CO2 27 12/25/2016   Lab Results  Component Value Date   MICROALBUR 3.5 (H) 04/24/2017

## 2017-06-28 NOTE — Assessment & Plan Note (Signed)
well-controlled on diet alone.  Victoza was stopped during recent hospitalization for pancreatitis and suspected to be causative.   Patient is up-to-date on eye exams and foot exam is normal today.   Lab Results  Component Value Date   HGBA1C 6.3 12/25/2016   Lab Results  Component Value Date   MICROALBUR 3.5 (H) 04/24/2017

## 2017-06-28 NOTE — Assessment & Plan Note (Signed)
She has been taking excessive doses of Detrol LA  (16 mg daily) despite recommendations to reduce dose because lower doses were not effective.  Reducing dose to 12 mg daily

## 2017-06-28 NOTE — Assessment & Plan Note (Signed)
Nystatin powder in abdominal folds.  Advised to avoid use near vaginal folds.

## 2017-06-28 NOTE — Assessment & Plan Note (Signed)
Post cholecystectomy.  Trial  of bentyl

## 2017-06-28 NOTE — Assessment & Plan Note (Signed)
Dose increased to 88 mcg for  TSH of 22.  Will increase to 112 at next check if TSH > 10 Lab Results  Component Value Date   TSH 22.62 (H) 06/13/2017

## 2017-06-28 NOTE — Assessment & Plan Note (Signed)
Improved with recovery from multiple surgeries.   Discussed zoloft wean.

## 2017-07-02 ENCOUNTER — Telehealth: Payer: Self-pay | Admitting: Internal Medicine

## 2017-07-02 MED ORDER — TOLTERODINE TARTRATE ER 4 MG PO CP24: 4.0000 mg | ORAL_CAPSULE | Freq: Every day | ORAL | 3 refills | Status: DC

## 2017-07-02 NOTE — Telephone Encounter (Signed)
Patient returned call, advised of the note by Dr. Darrick Huntsmanullo on 07/02/17, patient says "I am going to call my insurance company, because this is ridiculous." I advised that if she wants a urology referral to call the office back, she verbalized understanding.

## 2017-07-02 NOTE — Telephone Encounter (Signed)
Patient called back and says "I spoke to [employer] insurance company and they said the medication will be paid for taking it once in the morning and once at night. I called [facility] and the pharmacist says there needs to be a new prescription sent by Dr. Darrick Huntsmanullo that says 1 tab in the morning and 1 tab at night." I advised this would be sent to Dr. Darrick Huntsmanullo for review, she verbalized understanding.

## 2017-07-02 NOTE — Telephone Encounter (Signed)
Please see the messages below.  Patient is asking for Rx to be changed.  Can you make this change for the patient?

## 2017-07-02 NOTE — Telephone Encounter (Signed)
Copied from CRM [id number]. Topic: Quick Communication - See Telephone Encounter >> Jul 02, 2017 11:59 AM Oneal GroutSebastian, Jennifer S wrote: CRM for notification. See Telephone encounter for: 07/02/17. [facility] calling, stating that tolterodine (DETROL LA) 4 MG 24 hr capsule is over manufacture dosage. Please advise REF# [id number]

## 2017-07-02 NOTE — Telephone Encounter (Addendum)
Left message to return call. [facility] nurse may advise patient.

## 2017-07-02 NOTE — Telephone Encounter (Signed)
Let patient know that her insurance will no longer pay or fill the rx for more than the maximum dose which is once daily so the maximal dose is 4 mg daily  If she would like to see Urology abut her incontinence I can make a referral

## 2017-07-03 MED ORDER — TOLTERODINE TARTRATE ER 4 MG PO CP24: 4.0000 mg | ORAL_CAPSULE | Freq: Two times a day (BID) | ORAL | 3 refills | Status: DC

## 2017-07-03 NOTE — Telephone Encounter (Signed)
Patient spoke with insurance and they agreed to pay for Detrol LA BID 4 mg ,I sent new script FYI.

## 2017-07-03 NOTE — Addendum Note (Signed)
Addended by: Henrene PastorAVIS, Sumayyah Custodio R on: 07/03/2017 11:30 AM   Modules accepted: Orders

## 2017-07-20 ENCOUNTER — Other Ambulatory Visit: Payer: Self-pay | Admitting: Internal Medicine

## 2017-07-23 NOTE — Telephone Encounter (Signed)
Pt states she takes this before having a dental procedure.   Last OV: 06/25/2017 Next OV: not scheduled

## 2017-07-27 ENCOUNTER — Other Ambulatory Visit: Payer: Self-pay | Admitting: Internal Medicine

## 2017-07-30 ENCOUNTER — Other Ambulatory Visit (INDEPENDENT_AMBULATORY_CARE_PROVIDER_SITE_OTHER): Payer: Medicare Other

## 2017-07-30 DIAGNOSIS — E039 Hypothyroidism, unspecified: Secondary | ICD-10-CM

## 2017-07-30 DIAGNOSIS — E118 Type 2 diabetes mellitus with unspecified complications: Secondary | ICD-10-CM

## 2017-07-30 LAB — COMPREHENSIVE METABOLIC PANEL
ALT: 12 U/L (ref 0–35)
AST: 17 U/L (ref 0–37)
Albumin: 4.1 g/dL (ref 3.5–5.2)
Alkaline Phosphatase: 68 U/L (ref 39–117)
BUN: 20 mg/dL (ref 6–23)
CO2: 28 mEq/L (ref 19–32)
Calcium: 9.3 mg/dL (ref 8.4–10.5)
Chloride: 101 mEq/L (ref 96–112)
Creatinine, Ser: 0.92 mg/dL (ref 0.40–1.20)
GFR: 62.99 mL/min (ref >60.00)
Glucose, Bld: 152 mg/dL — ABNORMAL HIGH (ref 70–99)
Potassium: 4.2 mEq/L (ref 3.5–5.1)
Sodium: 139 mEq/L (ref 135–145)
Total Bilirubin: 0.4 mg/dL (ref 0.2–1.2)
Total Protein: 7.7 g/dL (ref 6.0–8.3)

## 2017-07-30 LAB — LIPID PANEL
Cholesterol: 267 mg/dL — ABNORMAL HIGH (ref 0–200)
HDL: 44 mg/dL (ref >39.00)
NonHDL: 222.58
Total CHOL/HDL Ratio: 6
Triglycerides: 228 mg/dL — ABNORMAL HIGH (ref 0.0–149.0)
VLDL: 45.6 mg/dL — ABNORMAL HIGH (ref 0.0–40.0)

## 2017-07-30 LAB — HEMOGLOBIN A1C: Hgb A1c MFr Bld: 7.3 % — ABNORMAL HIGH (ref 4.6–6.5)

## 2017-07-30 LAB — TSH: TSH: 10.13 u[IU]/mL — ABNORMAL HIGH (ref 0.35–4.50)

## 2017-07-30 LAB — LDL CHOLESTEROL, DIRECT: Direct LDL: 184 mg/dL

## 2017-08-03 ENCOUNTER — Other Ambulatory Visit: Payer: Self-pay | Admitting: Internal Medicine

## 2017-08-03 DIAGNOSIS — E118 Type 2 diabetes mellitus with unspecified complications: Secondary | ICD-10-CM

## 2017-08-03 DIAGNOSIS — E039 Hypothyroidism, unspecified: Secondary | ICD-10-CM

## 2017-08-03 MED ORDER — LEVOTHYROXINE SODIUM 100 MCG PO TABS: 100.0000 ug | ORAL_TABLET | Freq: Every day | ORAL | 0 refills | Status: DC

## 2017-08-03 NOTE — Assessment & Plan Note (Signed)
Lab Results  Component Value Date   HGBA1C 7.3 (H) 07/30/2017    slight loss of control.  Asked to submit BS readings   Follow up in august

## 2017-08-03 NOTE — Assessment & Plan Note (Signed)
Lab Results  Component Value Date   TSH 10.13 (H) 07/30/2017   Dose increased to 100 mcg daily

## 2017-08-21 ENCOUNTER — Encounter: Payer: Self-pay | Admitting: Podiatry

## 2017-08-21 ENCOUNTER — Ambulatory Visit: Payer: Medicare Other | Admitting: Podiatry

## 2017-08-21 DIAGNOSIS — E1142 Type 2 diabetes mellitus with diabetic polyneuropathy: Secondary | ICD-10-CM | POA: Diagnosis not present

## 2017-08-21 DIAGNOSIS — M79676 Pain in unspecified toe(s): Secondary | ICD-10-CM | POA: Diagnosis not present

## 2017-08-21 DIAGNOSIS — B351 Tinea unguium: Secondary | ICD-10-CM

## 2017-08-21 NOTE — Progress Notes (Signed)
Complaint:  Visit Type: Patient returns to my office for continued preventative foot care services. Complaint: Patient states" my nails have grown long and thick and become painful to walk and wear shoes" Patient has been diagnosed with DM with neuropathy. The patient presents for preventative foot care services. No changes to ROS  Podiatric Exam: Vascular: dorsalis pedis and posterior tibial pulses are palpable bilateral. Capillary return is immediate. Temperature gradient is WNL. Skin turgor WNL  Sensorium: Normal Semmes Weinstein monofilament test. Normal tactile sensation bilaterally. Nail Exam: Pt has thick disfigured discolored nails with subungual debris noted bilateral entire nail hallux through fifth toenails Ulcer Exam: There is no evidence of ulcer or pre-ulcerative changes or infection. Orthopedic Exam: Muscle tone and strength are WNL. No limitations in general ROM. No crepitus or effusions noted. Contracted digits  B/L.   Severe PTTD left foot.  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  Hammer toes 2,3 right. Skin: No Porokeratosis. No infection or ulcers  Diagnosis:  Onychomycosis, , Pain in right toe, pain in left toes,  Diabetes with neuropathy.  Treatment & Plan Procedures and Treatment: Consent by patient was obtained for treatment procedures.   Debridement of mycotic and hypertrophic toenails, 1 through 5 bilateral and clearing of subungual debris. No ulceration, no infection noted.  Return Visit-Office Procedure: Patient instructed to return to the office for a follow up visit 3 months for continued evaluation and treatment.    Margaret Staggs DPM 

## 2017-10-26 ENCOUNTER — Other Ambulatory Visit: Payer: Self-pay | Admitting: Internal Medicine

## 2017-11-04 ENCOUNTER — Other Ambulatory Visit: Payer: Self-pay | Admitting: Internal Medicine

## 2017-11-12 ENCOUNTER — Encounter: Payer: Self-pay | Admitting: Internal Medicine

## 2017-11-12 ENCOUNTER — Telehealth: Payer: Self-pay

## 2017-11-12 ENCOUNTER — Ambulatory Visit: Payer: Medicare Other | Admitting: Internal Medicine

## 2017-11-12 VITALS — BP 126/70 | HR 101 | Temp 98.1°F | Resp 17 | Ht 62.0 in | Wt 332.0 lb

## 2017-11-12 DIAGNOSIS — E039 Hypothyroidism, unspecified: Secondary | ICD-10-CM

## 2017-11-12 DIAGNOSIS — I10 Essential (primary) hypertension: Secondary | ICD-10-CM

## 2017-11-12 DIAGNOSIS — E118 Type 2 diabetes mellitus with unspecified complications: Secondary | ICD-10-CM

## 2017-11-12 DIAGNOSIS — E1121 Type 2 diabetes mellitus with diabetic nephropathy: Secondary | ICD-10-CM

## 2017-11-12 DIAGNOSIS — E782 Mixed hyperlipidemia: Secondary | ICD-10-CM | POA: Diagnosis not present

## 2017-11-12 DIAGNOSIS — K915 Postcholecystectomy syndrome: Secondary | ICD-10-CM

## 2017-11-12 LAB — COMPREHENSIVE METABOLIC PANEL
ALT: 23 U/L (ref 0–35)
AST: 29 U/L (ref 0–37)
Albumin: 4.4 g/dL (ref 3.5–5.2)
Alkaline Phosphatase: 77 U/L (ref 39–117)
BUN: 13 mg/dL (ref 6–23)
CO2: 26 mEq/L (ref 19–32)
Calcium: 10 mg/dL (ref 8.4–10.5)
Chloride: 100 mEq/L (ref 96–112)
Creatinine, Ser: 0.96 mg/dL (ref 0.40–1.20)
GFR: 59.92 mL/min — ABNORMAL LOW (ref >60.00)
Glucose, Bld: 136 mg/dL — ABNORMAL HIGH (ref 70–99)
Potassium: 4.5 mEq/L (ref 3.5–5.1)
Sodium: 137 mEq/L (ref 135–145)
Total Bilirubin: 0.5 mg/dL (ref 0.2–1.2)
Total Protein: 8.2 g/dL (ref 6.0–8.3)

## 2017-11-12 LAB — HEMOGLOBIN A1C: Hgb A1c MFr Bld: 7.3 % — ABNORMAL HIGH (ref 4.6–6.5)

## 2017-11-12 LAB — LIPID PANEL
Cholesterol: 264 mg/dL — ABNORMAL HIGH (ref 0–200)
HDL: 43.8 mg/dL (ref >39.00)
NonHDL: 220.57
Total CHOL/HDL Ratio: 6
Triglycerides: 255 mg/dL — ABNORMAL HIGH (ref 0.0–149.0)
VLDL: 51 mg/dL — ABNORMAL HIGH (ref 0.0–40.0)

## 2017-11-12 LAB — TSH: TSH: 5.35 u[IU]/mL — ABNORMAL HIGH (ref 0.35–4.50)

## 2017-11-12 LAB — LDL CHOLESTEROL, DIRECT: Direct LDL: 191 mg/dL

## 2017-11-12 MED ORDER — FUROSEMIDE 20 MG PO TABS: 20.0000 mg | ORAL_TABLET | Freq: Every day | ORAL | 3 refills | Status: DC | PRN

## 2017-11-12 NOTE — Patient Instructions (Addendum)
For your diarrhea:  Try taking 1 immodium daily in the morning  Before you eat. This may cure your diarrhea  Suspend the bentyl  During the immodium trial  Unless  You continue to have loose stools that you feel it prevents diarrhea later in the day    Start checking your blood pressure once a day  Along with your pulse (Heart rate)   If your pulse is constantly > 95,  We should change your blood pressure medication to metoprolol which will lower your pulse   If your thyroid is still underactive, I will increase your thyroid dose  Again  WE SHOULD RECHECK YOUR THYROID AFTER 6 WEEKS OF THE NEW DOSE OF THYROID MEDICATION.     Breyer's "carb smart " fudgsicles or ice cream bars are low carb

## 2017-11-12 NOTE — Telephone Encounter (Signed)
Pt stated that the name of the fluid pill she has been taking PRN in called Lasix 20mg . Pt is needing a refill. Is it okay to refill?

## 2017-11-12 NOTE — Telephone Encounter (Signed)
Left message letting pt know that the medication was sent in for every other day use as needed and that the rx was sent to [facility].

## 2017-11-12 NOTE — Telephone Encounter (Signed)
Refilled for use every other day at the most.  Sent to [facility]

## 2017-11-12 NOTE — Progress Notes (Addendum)
Subjective:  Patient ID: Bonnie Hinton, female    DOB: 1941-02-16  Age: 77 y.o. MRN: [id number]  CC: The primary encounter diagnosis was Mixed hyperlipidemia. Diagnoses of Acquired hypothyroidism, Diabetic nephropathy associated with type 2 diabetes mellitus (HCC), Diabetes mellitus with complication (HCC), Post-cholecystectomy syndrome, Essential hypertension, benign, and Morbid obesity (HCC) were also pertinent to this visit.  HPI Bonnie Hinton presents for  Follow up on type 2 DM,  Hypothyroidism, and impaired ambulaiton post knee replacement.    She has been unable to regain independent mobility and remains dependent on a walker. She is driving and living independently. She has not  had PT since December .  Feels depressed.  doing the exercises on her own but feels she has los ground since the physical therapist she relied on stopped coming .  Patient is driving to store and medical office  Does not want to drive to PT  Did not return for repeat tsh check after last level was drawn and medication was modified.   Taking furosemide 20 mg as  needed from previous PCP   still having loose stools 1-2 times daily especially  after any vegetables  And after eating salad  S/p cholecystectomy  June 2018 . Having solid stools also.  Bentyl not helping.  Blood sugars have been "good"  Mostly  By following a low GI diet   No meds. . Had diabetic eye exam last month. Sees Dr [facility] in [place]     Outpatient Medications Prior to Visit  Medication Sig Dispense Refill  . acetaminophen (TYLENOL) 325 MG tablet Take 2 tablets (650 mg total) by mouth every 6 (six) hours as needed for mild pain (or Fever >/= 101).    Marland Kitchen amLODipine (NORVASC) 5 MG tablet Take 1 tablet (5 mg total) by mouth daily. 90 tablet 1  . amoxicillin (AMOXIL) 500 MG capsule TAKE 4 CAPSULES BY MOUTH 1 HOUR PRIOR TO DENTAL PROCEDURE 20 capsule 2  . celecoxib (CELEBREX) 200 MG capsule Take 1 capsule (200 mg total)  by mouth 2 (two) times daily. 180 capsule 0  . Cholecalciferol (VITAMIN D3) 2000 units TABS Take 1 tablet by mouth daily.    . cycloSPORINE (RESTASIS) 0.05 % ophthalmic emulsion Place 1 drop into Hinton eyes 2 (two) times daily.    Marland Kitchen dicyclomine (BENTYL) 20 MG tablet Take 1 tablet (20 mg total) by mouth 4 (four) times daily -  before meals and at bedtime. 270 tablet 0  . LUMIGAN 0.01 % SOLN Place 1-2 drops into Hinton eyes at bedtime.    Marland Kitchen nystatin cream (MYCOSTATIN) Apply 1 application topically 2 (two) times daily. 90 g 2  . pantoprazole (PROTONIX) 40 MG tablet TAKE 1 TABLET DAILY 90 tablet 3  . tiZANidine (ZANAFLEX) 4 MG capsule TAKE 1 CAPSULE THREE TIMES A DAY AS NEEDED FOR MUSCLE SPASMS 90 capsule 3  . tolterodine (DETROL LA) 4 MG 24 hr capsule Take 1 capsule (4 mg total) by mouth 2 (two) times daily. 180 capsule 3  . levothyroxine (SYNTHROID, LEVOTHROID) 100 MCG tablet TAKE 1 TABLET (100 MCG TOTAL) BY MOUTH DAILY BEFORE BREAKFAST. 90 tablet 0   No facility-administered medications prior to visit.     Review of Systems;  Patient denies headache, fevers, malaise, unintentional weight loss, skin rash, eye pain, sinus congestion and sinus pain, sore throat, dysphagia,  hemoptysis , cough, dyspnea, wheezing, chest pain, palpitations, orthopnea, edema, abdominal pain, nausea, melena, diarrhea, constipation, flank pain, dysuria, hematuria, urinary  Frequency, nocturia, numbness, tingling, seizures,  Focal weakness, Loss of consciousness,  Tremor, insomnia, depression, anxiety, and suicidal ideation.      Objective:  BP 126/70 (BP Location: Left Arm, Patient Position: Sitting, Cuff Size: Large) Comment (Cuff Size): thigh cuff  Pulse (!) 101   Temp 98.1 F (36.7 C) (Oral)   Resp 17   Ht 5\' 2"  (1.575 m)   Wt (!) 332 lb (150.6 kg)   SpO2 97%   BMI 60.72 kg/m   BP Readings from Last 3 Encounters:  11/12/17 126/70  06/25/17 140/66  12/25/16 136/82    Wt Readings from Last 3 Encounters:    11/12/17 (!) 332 lb (150.6 kg)  06/25/17 (!) 332 lb 12.8 oz (151 kg)  12/25/16 299 lb (135.6 kg)    General appearance: alert, cooperative and appears stated age Ears: normal TM's and external ear canals Hinton ears Throat: lips, mucosa, and tongue normal; teeth and gums normal Neck: no adenopathy, no carotid bruit, supple, symmetrical, trachea midline and thyroid not enlarged, symmetric, no tenderness/mass/nodules Back: symmetric, no curvature. ROM normal. No CVA tenderness. Lungs: clear to auscultation bilaterally Heart: regular rate and rhythm, S1, S2 normal, no murmur, click, rub or gallop Abdomen: soft, non-tender; bowel sounds normal; no masses,  no organomegaly Pulses: 2+ and symmetric Skin: Skin color, texture, turgor normal. No rashes or lesions Lymph nodes: Cervical, supraclavicular, and axillary nodes normal.  Lab Results  Component Value Date   HGBA1C 7.3 (H) 11/12/2017   HGBA1C 7.3 (H) 07/30/2017   HGBA1C 6.3 12/25/2016    Lab Results  Component Value Date   CREATININE 0.96 11/12/2017   CREATININE 0.92 07/30/2017   CREATININE 0.78 12/25/2016    Lab Results  Component Value Date   WBC 8.9 09/24/2016   HGB 13.1 09/24/2016   HCT 41 09/24/2016   PLT 452 (A) 09/24/2016   GLUCOSE 136 (H) 11/12/2017   CHOL 264 (H) 11/12/2017   TRIG 255.0 (H) 11/12/2017   HDL 43.80 11/12/2017   LDLDIRECT 191.0 11/12/2017   LDLCALC 210 (H) 12/25/2016   ALT 23 11/12/2017   AST 29 11/12/2017   NA 137 11/12/2017   K 4.5 11/12/2017   CL 100 11/12/2017   CREATININE 0.96 11/12/2017   BUN 13 11/12/2017   CO2 26 11/12/2017   TSH 5.35 (H) 11/12/2017   INR 1.37 09/04/2016   HGBA1C 7.3 (H) 11/12/2017   MICROALBUR 3.5 (H) 04/24/2017    Dg Chest 2 View  Result Date: 09/04/2016 CLINICAL DATA:  Preop chest exam, bronchitis. EXAM: CHEST  2 VIEW COMPARISON:  None. FINDINGS: The heart size and mediastinal contours are within normal limits. Low lung volumes with mild increase in  interstitial prominence suspicious for chronic bronchitic change. No pneumonic consolidation, effusion or pneumothorax. Mild degenerative change along the dorsal spine. IMPRESSION: Mild interstitial prominence bilaterally which may reflect chronic bronchitic change. No pulmonary consolidations to suggest pneumonia. Electronically Signed   By: Tollie Ethavid  Kwon M.D.   On: 09/04/2016 13:51   Mr Abdomen Mrcp Wo Contrast  Result Date: 09/04/2016 CLINICAL DATA:  Right upper quadrant abdominal pain. Pancreatitis with elevated liver function studies. EXAM: MRI ABDOMEN WITHOUT CONTRAST  (INCLUDING MRCP) TECHNIQUE: Multiplanar multisequence MR imaging of the abdomen was performed. Heavily T2-weighted images of the biliary and pancreatic ducts were obtained, and three-dimensional MRCP images were rendered by post processing. COMPARISON:  Ultrasound today and 04/06/2016. FINDINGS: Despite efforts by the technologist and patient, moderate motion artifact is present on today's exam and could not  be eliminated. This reduces exam sensitivity and specificity. Lower chest: The visualized lower chest appears remarkable. No significant pleural effusion. Hepatobiliary: The liver demonstrates loss of signal on the gradient echo opposed phase images consistent with steatosis. No focal lesions are identified. Multiple small gallstones are present. There is no gallbladder wall thickening or surrounding inflammation. The ERCP images are degraded by motion. No significant biliary dilatation. No definite choledocholithiasis. Pancreas: Fatty replaced with mild surrounding edema. No focal fluid collection or ductal dilatation identified. Spleen: Normal in size without focal abnormality. Adrenals/Urinary Tract: Hinton adrenal glands appear normal. Tiny cyst in the lower pole of the left kidney. The right kidney appears normal. No hydronephrosis. Stomach/Bowel: No evidence of bowel wall thickening, distention or surrounding inflammatory change.  Vascular/Lymphatic: Small lymph nodes in the porta hepatis, likely reactive. No enlarged retroperitoneal lymph nodes. No significant vascular findings on noncontrast imaging identified. Other: No ascites. Musculoskeletal: No acute or significant osseous findings. IMPRESSION: 1. Motion artifact results in suboptimal evaluation of the biliary system. Cholelithiasis without evidence of cholecystitis, significant biliary dilatation or definite choledocholithiasis. 2. Hepatic steatosis. 3. Peripancreatic edema consistent with pancreatitis. No focal fluid collection. Electronically Signed   By: Carey BullocksWilliam  Veazey M.D.   On: 09/04/2016 13:53   Mr 3d Recon At Scanner  Result Date: 09/04/2016 CLINICAL DATA:  Right upper quadrant abdominal pain. Pancreatitis with elevated liver function studies. EXAM: MRI ABDOMEN WITHOUT CONTRAST  (INCLUDING MRCP) TECHNIQUE: Multiplanar multisequence MR imaging of the abdomen was performed. Heavily T2-weighted images of the biliary and pancreatic ducts were obtained, and three-dimensional MRCP images were rendered by post processing. COMPARISON:  Ultrasound today and 04/06/2016. FINDINGS: Despite efforts by the technologist and patient, moderate motion artifact is present on today's exam and could not be eliminated. This reduces exam sensitivity and specificity. Lower chest: The visualized lower chest appears remarkable. No significant pleural effusion. Hepatobiliary: The liver demonstrates loss of signal on the gradient echo opposed phase images consistent with steatosis. No focal lesions are identified. Multiple small gallstones are present. There is no gallbladder wall thickening or surrounding inflammation. The ERCP images are degraded by motion. No significant biliary dilatation. No definite choledocholithiasis. Pancreas: Fatty replaced with mild surrounding edema. No focal fluid collection or ductal dilatation identified. Spleen: Normal in size without focal abnormality.  Adrenals/Urinary Tract: Hinton adrenal glands appear normal. Tiny cyst in the lower pole of the left kidney. The right kidney appears normal. No hydronephrosis. Stomach/Bowel: No evidence of bowel wall thickening, distention or surrounding inflammatory change. Vascular/Lymphatic: Small lymph nodes in the porta hepatis, likely reactive. No enlarged retroperitoneal lymph nodes. No significant vascular findings on noncontrast imaging identified. Other: No ascites. Musculoskeletal: No acute or significant osseous findings. IMPRESSION: 1. Motion artifact results in suboptimal evaluation of the biliary system. Cholelithiasis without evidence of cholecystitis, significant biliary dilatation or definite choledocholithiasis. 2. Hepatic steatosis. 3. Peripancreatic edema consistent with pancreatitis. No focal fluid collection. Electronically Signed   By: Carey BullocksWilliam  Veazey M.D.   On: 09/04/2016 13:53   [place] Abdomen Limited Ruq  Result Date: 09/04/2016 CLINICAL DATA:  Right upper quadrant pain EXAM: ULTRASOUND ABDOMEN LIMITED RIGHT UPPER QUADRANT COMPARISON:  Ultrasound abdomen 04/06/2016 FINDINGS: Gallbladder: Cholelithiasis. Largest calculus 13 mm. Negative for gallbladder wall thickening. Negative for sonographic Murphy sign Common bile duct: Diameter: 7 mm Liver: Increased echogenicity of the liver without focal liver lesion. IMPRESSION: Cholelithiasis without evidence of cholecystitis Common bile duct upper normal Echogenic liver compatible with fatty infiltration. Electronically Signed   By: Marlan Palauharles  Clark M.D.  On: 09/04/2016 10:42    Assessment & Plan:   Problem List Items Addressed This Visit    Diabetic nephropathy associated with type 2 diabetes mellitus (HCC)     lisinopril stopped in 2017 during acute kidney injury and Cr normalized after cessation    Lab Results  Component Value Date   HGBA1C 7.3 (H) 11/12/2017   Lab Results  Component Value Date   MICROALBUR 3.5 (H) 04/24/2017         Morbid  obesity (HCC)    I have  encouraged  Continued weight loss with goal of 10% of body weigh over the next 6 months using a low glycemic index diet and regular exercise a minimum of 5 days per week.        Essential hypertension, benign    BP is at goal but she is tachycardic today.  Asked to check BP and pulse at home.       Hyperlipidemia - Primary    Using the Framingham risk calculator,  Her 10 year risk of coronary artery disease is 53%.statin advised.       Relevant Orders   Lipid panel (Completed)   LDL cholesterol, direct (Completed)   Acquired hypothyroidism    Thyroid is approaching therapeutic goal.  Will increase levothyroxine to 112 mcg daily   Lab Results  Component Value Date   TSH 5.35 (H) 11/12/2017        Relevant Medications   levothyroxine (SYNTHROID, LEVOTHROID) 112 MCG tablet   Other Relevant Orders   TSH (Completed)   Diabetes mellitus with complication (HCC)    Lab Results  Component Value Date   HGBA1C 7.3 (H) 11/12/2017    continued slight loss of control.  Asked to submit BS readings   Follow up in 3 months       Relevant Orders   Hemoglobin A1c (Completed)   Comprehensive metabolic panel (Completed)   Post-cholecystectomy syndrome    Trial of imodium  Once daily in the morning         A total of 25 minutes of face to face time was spent with patient more than half of which was spent in counselling about the above mentioned conditions  and coordination of care  I have changed Genifer A. Chapdelaine's levothyroxine. I am also having her maintain her acetaminophen, cycloSPORINE, LUMIGAN, amLODipine, nystatin cream, pantoprazole, Vitamin D3, dicyclomine, celecoxib, tolterodine, amoxicillin, and tiZANidine.  Meds ordered this encounter  Medications  . levothyroxine (SYNTHROID, LEVOTHROID) 112 MCG tablet    Sig: Take 1 tablet (112 mcg total) by mouth daily before breakfast.    Dispense:  90 tablet    Refill:  0    Medications Discontinued  During This Encounter  Medication Reason  . levothyroxine (SYNTHROID, LEVOTHROID) 100 MCG tablet     Follow-up: Return in about 6 weeks (around 12/24/2017).   Sherlene Shams, MD

## 2017-11-12 NOTE — Telephone Encounter (Signed)
Copied from CRM [id number]. Topic: General - Other >> Nov 12, 2017 12:53 PM Gerrianne ScalePayne, Angela L wrote: Reason for CRM: pt calling back with the name of her water pill which is Lasix 20mg 

## 2017-11-12 NOTE — Addendum Note (Signed)
Addended by: Sherlene ShamsULLO, Laetitia Schnepf L on: 11/12/2017 02:42 PM   Modules accepted: Orders

## 2017-11-13 DIAGNOSIS — K915 Postcholecystectomy syndrome: Secondary | ICD-10-CM | POA: Insufficient documentation

## 2017-11-13 MED ORDER — LEVOTHYROXINE SODIUM 112 MCG PO TABS: 112.0000 ug | ORAL_TABLET | Freq: Every day | ORAL | 0 refills | Status: DC

## 2017-11-13 NOTE — Assessment & Plan Note (Signed)
Trial of imodium  Once daily in the morning

## 2017-11-13 NOTE — Addendum Note (Signed)
Addended by: Sherlene ShamsULLO, Jaysun Wessels L on: 11/13/2017 09:36 PM   Modules accepted: Orders

## 2017-11-13 NOTE — Assessment & Plan Note (Signed)
BP is at goal but she is tachycardic today.  Asked to check BP and pulse at home.

## 2017-11-13 NOTE — Assessment & Plan Note (Signed)
Using the Framingham risk calculator,  Her 10 year risk of coronary artery disease is 53%.statin advised.

## 2017-11-13 NOTE — Assessment & Plan Note (Signed)
Lab Results  Component Value Date   HGBA1C 7.3 (H) 11/12/2017    continued slight loss of control.  Asked to submit BS readings   Follow up in 3 months

## 2017-11-13 NOTE — Assessment & Plan Note (Signed)
Thyroid is approaching therapeutic goal.  Will increase levothyroxine to 112 mcg daily   Lab Results  Component Value Date   TSH 5.35 (H) 11/12/2017

## 2017-11-13 NOTE — Assessment & Plan Note (Signed)
I have encouraged  Continued weight loss with goal of 10% of body weigh over the next 6 months using a low glycemic index diet and regular exercise a minimum of 5 days per week.  ? ?

## 2017-11-13 NOTE — Assessment & Plan Note (Signed)
lisinopril stopped in 2017 during acute kidney injury and Cr normalized after cessation    Lab Results  Component Value Date   HGBA1C 7.3 (H) 11/12/2017   Lab Results  Component Value Date   MICROALBUR 3.5 (H) 04/24/2017

## 2017-11-17 ENCOUNTER — Encounter: Payer: Self-pay | Admitting: Podiatry

## 2017-11-17 ENCOUNTER — Ambulatory Visit: Payer: Medicare Other | Admitting: Podiatry

## 2017-11-17 DIAGNOSIS — E1142 Type 2 diabetes mellitus with diabetic polyneuropathy: Secondary | ICD-10-CM | POA: Diagnosis not present

## 2017-11-17 DIAGNOSIS — M79676 Pain in unspecified toe(s): Secondary | ICD-10-CM | POA: Diagnosis not present

## 2017-11-17 DIAGNOSIS — B351 Tinea unguium: Secondary | ICD-10-CM | POA: Diagnosis not present

## 2017-11-17 NOTE — Progress Notes (Signed)
Complaint:  Visit Type: Patient returns to my office for continued preventative foot care services. Complaint: Patient states" my nails have grown long and thick and become painful to walk and wear shoes" Patient has been diagnosed with DM with neuropathy. The patient presents for preventative foot care services. No changes to ROS  Podiatric Exam: Vascular: dorsalis pedis and posterior tibial pulses are palpable bilateral. Capillary return is immediate. Temperature gradient is WNL. Skin turgor WNL  Sensorium: Normal Semmes Weinstein monofilament test. Normal tactile sensation bilaterally. Nail Exam: Pt has thick disfigured discolored nails with subungual debris noted bilateral entire nail hallux through fifth toenails Ulcer Exam: There is no evidence of ulcer or pre-ulcerative changes or infection. Orthopedic Exam: Muscle tone and strength are WNL. No limitations in general ROM. No crepitus or effusions noted. Contracted digits  B/L.   Severe PTTD left foot.  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  Hammer toes 2,3 right. Skin: No Porokeratosis. No infection or ulcers  Diagnosis:  Onychomycosis, , Pain in right toe, pain in left toes,  Diabetes with neuropathy.  Treatment & Plan Procedures and Treatment: Consent by patient was obtained for treatment procedures.   Debridement of mycotic and hypertrophic toenails, 1 through 5 bilateral and clearing of subungual debris. No ulceration, no infection noted.  Return Visit-Office Procedure: Patient instructed to return to the office for a follow up visit 3 months for continued evaluation and treatment.    Sakib Noguez DPM 

## 2017-11-25 ENCOUNTER — Other Ambulatory Visit: Payer: Self-pay | Admitting: Internal Medicine

## 2017-11-25 DIAGNOSIS — Z79899 Other long term (current) drug therapy: Secondary | ICD-10-CM

## 2017-11-25 MED ORDER — ROSUVASTATIN CALCIUM 5 MG PO TABS: 5.0000 mg | ORAL_TABLET | Freq: Every day | ORAL | 5 refills | Status: DC

## 2017-11-26 ENCOUNTER — Other Ambulatory Visit: Payer: Self-pay | Admitting: Internal Medicine

## 2017-12-04 ENCOUNTER — Other Ambulatory Visit: Payer: Self-pay | Admitting: Internal Medicine

## 2017-12-08 ENCOUNTER — Telehealth: Payer: Self-pay | Admitting: Internal Medicine

## 2017-12-08 NOTE — Telephone Encounter (Signed)
Spoke to pt and got AWV scheduled.   She had a question for Dr. Darrick Huntsmanullo. Pt got OTC COQ-10 100 mg (Heart and muscle health). Daily recommendations on the bottle says 1-2 tablets per day.  Pt would like to know if she should be taking 1 OR 2 per day?? She has currently been taking 1 in the morning and 1 in the evening.  Please advise

## 2017-12-08 NOTE — Telephone Encounter (Signed)
Continue taking the co q 10 as she is doiing:   2 times daily

## 2017-12-09 NOTE — Telephone Encounter (Signed)
Left detailed message with instructions on home phone

## 2017-12-23 ENCOUNTER — Ambulatory Visit (INDEPENDENT_AMBULATORY_CARE_PROVIDER_SITE_OTHER): Payer: Medicare Other

## 2017-12-23 ENCOUNTER — Other Ambulatory Visit: Payer: Medicare Other

## 2017-12-23 ENCOUNTER — Other Ambulatory Visit: Payer: Self-pay | Admitting: Internal Medicine

## 2017-12-23 VITALS — BP 126/78 | HR 82 | Temp 98.5°F | Resp 16 | Ht 65.0 in | Wt 330.4 lb

## 2017-12-23 DIAGNOSIS — Z23 Encounter for immunization: Secondary | ICD-10-CM

## 2017-12-23 DIAGNOSIS — Z0000 Encounter for general adult medical examination without abnormal findings: Secondary | ICD-10-CM

## 2017-12-23 DIAGNOSIS — Z79899 Other long term (current) drug therapy: Secondary | ICD-10-CM

## 2017-12-23 LAB — COMPREHENSIVE METABOLIC PANEL
ALT: 22 U/L (ref 0–35)
AST: 26 U/L (ref 0–37)
Albumin: 4.3 g/dL (ref 3.5–5.2)
Alkaline Phosphatase: 71 U/L (ref 39–117)
BUN: 13 mg/dL (ref 6–23)
CO2: 26 mEq/L (ref 19–32)
Calcium: 9.7 mg/dL (ref 8.4–10.5)
Chloride: 101 mEq/L (ref 96–112)
Creatinine, Ser: 0.89 mg/dL (ref 0.40–1.20)
GFR: 65.38 mL/min (ref >60.00)
Glucose, Bld: 122 mg/dL — ABNORMAL HIGH (ref 70–99)
Potassium: 4.2 mEq/L (ref 3.5–5.1)
Sodium: 136 mEq/L (ref 135–145)
Total Bilirubin: 0.5 mg/dL (ref 0.2–1.2)
Total Protein: 8 g/dL (ref 6.0–8.3)

## 2017-12-23 NOTE — Progress Notes (Signed)
Subjective:   Bonnie Hinton is a 77 y.o. female who presents for an Initial Medicare Annual Wellness Visit.  Review of Systems    No ROS.  Medicare Wellness Visit. Additional risk factors are reflected in the social history.  Cardiac Risk Factors include: advanced age (>50men, >53 women);hypertension;diabetes mellitus;obesity (BMI >30kg/m2)     Objective:    Today's Vitals   12/23/17 1043  BP: 126/78  Pulse: 82  Resp: 16  Temp: 98.5 F (36.9 C)  TempSrc: Oral  SpO2: 96%  Weight: (!) 330 lb 6.4 oz (149.9 kg)  Height: 5\' 5"  (1.651 m)   Body mass index is 54.98 kg/m.  Advanced Directives 12/23/2017 09/04/2016 04/05/2016 01/05/2016 12/21/2015  Does Patient Have a Medical Advance Directive? Yes Yes No;Yes No Yes  Type of Advance Directive Living will;Healthcare Power of [employer] Power of [place];Living will - - Living will  Does patient want to make changes to medical advance directive? No - Patient declined No - Patient declined - - -  Copy of Healthcare Power of Attorney in Chart? No - copy requested No - copy requested - - No - copy requested  Would patient like information on creating a medical advance directive? - - - No - patient declined information -    Current Medications (verified) Outpatient Encounter Medications as of 12/23/2017  Medication Sig  . acetaminophen (TYLENOL) 325 MG tablet Take 2 tablets (650 mg total) by mouth every 6 (six) hours as needed for mild pain (or Fever >/= 101).  Marland Kitchen amLODipine (NORVASC) 5 MG tablet TAKE 1 TABLET DAILY  . amoxicillin (AMOXIL) 500 MG capsule TAKE 4 CAPSULES BY MOUTH 1 HOUR PRIOR TO DENTAL PROCEDURE  . celecoxib (CELEBREX) 200 MG capsule Take 1 capsule (200 mg total) by mouth 2 (two) times daily.  . Cholecalciferol (VITAMIN D3) 2000 units TABS Take 1 tablet by mouth daily.  . cycloSPORINE (RESTASIS) 0.05 % ophthalmic emulsion Place 1 drop into both eyes 2 (two) times daily.  Marland Kitchen dicyclomine (BENTYL) 20 MG tablet  TAKE 1 TABLET FOUR TIMES A DAY BEFORE MEALS AND AT BEDTIME  . furosemide (LASIX) 20 MG tablet TAKE 1 TABLET BY MOUTH DAILY AS NEEDED. FOR FLUID RETENTION NOT MORE THAN EVERY OTHER DAY  . levothyroxine (SYNTHROID, LEVOTHROID) 112 MCG tablet Take 1 tablet (112 mcg total) by mouth daily before breakfast.  . LUMIGAN 0.01 % SOLN Place 1-2 drops into both eyes at bedtime.  Marland Kitchen nystatin cream (MYCOSTATIN) Apply 1 application topically 2 (two) times daily.  . pantoprazole (PROTONIX) 40 MG tablet TAKE 1 TABLET DAILY  . tiZANidine (ZANAFLEX) 4 MG capsule TAKE 1 CAPSULE THREE TIMES A DAY AS NEEDED FOR MUSCLE SPASMS  . tolterodine (DETROL LA) 4 MG 24 hr capsule Take 1 capsule (4 mg total) by mouth 2 (two) times daily.  . [DISCONTINUED] rosuvastatin (CRESTOR) 5 MG tablet Take 1 tablet (5 mg total) by mouth daily. (Patient not taking: Reported on 12/23/2017)   No facility-administered encounter medications on file as of 12/23/2017.     Allergies (verified) Erythromycin; Fentanyl; Iodinated diagnostic agents; Victoza [liraglutide]; Latex; Lisinopril; and Other   History: Past Medical History:  Diagnosis Date  . Anxiety   . Arthritis   . Choledocholithiasis   . Chronic back pain    stenosis and arthritis  . Complication of anesthesia    anxious when she wakes up  . GERD (gastroesophageal reflux disease)    takes Pantoprazole daily  . Glaucoma    dry angle  .  History of bronchitis    30+ yrs ago  . History of MRSA infection 2004   several yrs ago  . Hyperlipidemia    takes Zetia and Crestor daily  . Hypertension    take Amlodipine daily  . Hypothyroidism    takes Synthroid daily  . Joint pain   . Joint swelling   . Neuropathy    takes Gabapentin daily  . Pancreatitis, gallstone   . Peripheral edema    takes Furosemide daily as needed  . Pneumonia    hx of-30 + yrs ago  . Pre-diabetes    takes Victoza daily  . Primary localized osteoarthritis of right knee   . Subdural hematoma (HCC) 8  yrs ago   hx of  . Urinary frequency   . Urinary urgency    Past Surgical History:  Procedure Laterality Date  . ABDOMINAL HYSTERECTOMY    . CHOLECYSTECTOMY N/A 09/09/2016   Procedure: LAPAROSCOPIC CHOLECYSTECTOMY;  Surgeon: Manus Rudd, MD;  Location: [facility] OR;  Service: General;  Laterality: N/A;  . COLONOSCOPY    . RADIOLOGY WITH ANESTHESIA N/A 08/22/2015   Procedure: MRI OF LUMBAR SPINE   (RADIOLOGY WITH ANESTHESIA);  Surgeon: Medication Radiologist, MD;  Location: [facility] OR;  Service: Radiology;  Laterality: N/A;  . TOTAL HIP ARTHROPLASTY Right   . TOTAL HIP ARTHROPLASTY Left   . TOTAL KNEE ARTHROPLASTY Left   . TOTAL KNEE ARTHROPLASTY Right 01/01/2016   Procedure: TOTAL KNEE ARTHROPLASTY;  Surgeon: Salvatore Marvel, MD;  Location: [facility] OR;  Service: Orthopedics;  Laterality: Right;   Family History  Problem Relation Age of Onset  . Cancer Father   . Cancer Brother   . Diabetes Maternal Aunt    Social History   Socioeconomic History  . Marital status: Widowed    Spouse name: Not on file  . Number of children: Not on file  . Years of education: Not on file  . Highest education level: Not on file  Occupational History  . Not on file  Social Needs  . Financial resource strain: Not hard at all  . Food insecurity:    Worry: Never true    Inability: Never true  . Transportation needs:    Medical: No    Non-medical: No  Tobacco Use  . Smoking status: Never Smoker  . Smokeless tobacco: Never Used  Substance and Sexual Activity  . Alcohol use: No    Alcohol/week: 0.0 standard drinks  . Drug use: No  . Sexual activity: Not on file  Lifestyle  . Physical activity:    Days per week: 7 days    Minutes per session: Not on file  . Stress: Not at all  Relationships  . Social connections:    Talks on phone: Not on file    Gets together: Not on file    Attends religious service: Not on file    Active member of club or organization: Not on file    Attends meetings of clubs or  organizations: Not on file    Relationship status: Not on file  Other Topics Concern  . Not on file  Social History Narrative  . Not on file    Tobacco Counseling Counseling given: Not Answered   Clinical Intake:  Pre-visit preparation completed: Yes  Pain : No/denies pain     Nutritional Status: BMI > 30  Obese Diabetes: Yes(Followed by pcp)  How often do you need to have someone help you when you read instructions, pamphlets, or other written  materials from your doctor or pharmacy?: 1 - Never  Interpreter Needed?: No      Activities of Daily Living In your present state of health, do you have any difficulty performing the following activities: 12/23/2017  Hearing? N  Vision? N  Difficulty concentrating or making decisions? N  Walking or climbing stairs? Y  Dressing or bathing? N  Doing errands, shopping? N  Preparing Food and eating ? N  Using the Toilet? N  In the past six months, have you accidently leaked urine? Y  Comment Managed with a daily brief. Followed by pcp.   Do you have problems with loss of bowel control? Y  Comment Managed with a daily brief. Followed by pcp.   Managing your Medications? N  Managing your Finances? N  Housekeeping or managing your Housekeeping? N  Some recent data might be hidden     Immunizations and Health Maintenance Immunization History  Administered Date(s) Administered  . Influenza, High Dose Seasonal PF 01/23/2015, 12/04/2015, 12/25/2016, 12/23/2017  . Pneumococcal Conjugate-13 04/25/2015   Health Maintenance Due  Topic Date Due  . TETANUS/TDAP  01/10/1960  . DEXA SCAN  01/09/2006  . PNA vac Low Risk Adult (2 of 2 - PPSV23) 04/24/2016  . INFLUENZA VACCINE  10/23/2017    Patient Care Team: Sherlene Shams, MD as PCP - General (Internal Medicine)  Indicate any recent Medical Services you may have received from other than [facility] providers in the past year (date may be approximate).     Assessment:   This is a  routine wellness examination for [place]. The goal of the wellness visit is to assist the patient how to close the gaps in care and create a preventative care plan for the patient.   The roster of all physicians providing medical care to patient is listed in the Snapshot section of the chart.  States she feels over all ok today. No acute issues.  Labs completed.  Diabetes followed by pcp. Home glucose monitor reads fasting blood sugar 104 today. Monitors daily.   Hypertension is followed by pcp.  She reports monitoring regularly with numbers WNL. No log left for pcp today. Denies headaches, tachycardia, dizziness, chest pain. Taking medication as directed. Bring log to next office visit.   Taking calcium VIT D as appropriate/Osteoporosis risk reviewed.    Safety issues reviewed; Lives alone. Smoke and carbon monoxide detectors in the home. No firearms in the home. Wears seatbelts when driving or riding with others. No violence in the home.  They do not have excessive sun exposure.  Discussed the need for sun protection: hats, long sleeves and the use of sunscreen if there is significant sun exposure.  Patient is alert, normal appearance, oriented to person/place/and time. Correctly identified the president of the [place] and recalls of 2/3 words. Performs simple calculations and can read correct time from watch face.  Displays appropriate judgement.  No new identified risk were noted.  No failures at ADL's or IADL's.  Ambulates with walker.   BMI- discussed the importance of a healthy diet, water intake and the benefits of aerobic exercise. Educational material provided.   Diet: Avoids salads and vegetables due to loose stools post cholecystectomy June 2018.  She discontinued her statin medication and is trying to follow a low carb/low cholesterol diet.  Medications- Rosuvastatin and CoQ10 stopped per patient preference x1 week.  States, "My daughter told me to stop taking them. They  were making me feel bad. The statin made my legs  weak and the CoQ10 upset my stomach."  Requests medications be sent via mail order going forward, updated accordingly. Appointment offered for follow up, declined. She is scheduled to return next month for her 3 month f/u with pcp. Keep all routine maintenance appointments.   Dental- every 6 months.  Sleep patterns- Sleeps without issues. Wakes feeling rested.   High dose influenza administered L deltoid, tolerated well. No complaints during or post administration. Educational material provided.  Pneumococcal vaccine deferred due to administration of influenza vaccine today, per patient preference.   TDAP vaccine deferred per patient preference.  Follow up with insurance.  Educational material provided.  Dexa Scan discussed. Educational material provided.   Hearing/Vision screen Hearing Screening Comments: .Patient is able to hear conversational tones without difficulty.  No issues reported.  Vision Screening Comments: Followed by Dr. Dorothey Baseman [facility] Wears corrective lenses Last OV 09/2017 Visual acuity not assessed per patient preference since they have regular follow up with the ophthalmologist  Dietary issues and exercise activities discussed: Current Exercise Habits: Home exercise routine, Type of exercise: walking, Time (Minutes): 15, Frequency (Times/Week): 3, Weekly Exercise (Minutes/Week): 45, Intensity: Mild  Goals      Patient Stated   . Low Carb/Low Cholesterol Diet (pt-stated)      Depression Screen PHQ 2/9 Scores 12/23/2017 06/25/2017 05/24/2016 03/27/2016 04/25/2015  PHQ - 2 Score 0 0 0 6 0  PHQ- 9 Score - 1 - 10 -    Fall Risk Fall Risk  12/23/2017 06/25/2017 05/24/2016 04/25/2015  Falls in the past year? No No Yes No  Number falls in past yr: - - 2 or more -  Injury with Fall? - - No -    Cognitive Function: MMSE - Mini Mental State Exam 12/23/2017  Orientation to time 5  Orientation to Place 5  Registration 3    Attention/ Calculation 5  Recall 2  Language- name 2 objects 2  Language- repeat 1  Language- follow 3 step command 3  Language- read & follow direction 1  Write a sentence 1  Copy design 1  Total score 29        Screening Tests Health Maintenance  Topic Date Due  . TETANUS/TDAP  01[birth year]10/1960  . DEXA SCAN  01/09/2006  . PNA vac Low Risk Adult (2 of 2 - PPSV23) 04/24/2016  . INFLUENZA VACCINE  10/23/2017  . URINE MICROALBUMIN  04/24/2018  . HEMOGLOBIN A1C  05/15/2018  . OPHTHALMOLOGY EXAM  10/01/2018  . FOOT EXAM  11/13/2018     Plan:    End of life planning; Advance aging; Advanced directives discussed. Copy of current HCPOA/Living Will requested.  Keep all routine maintenance appointments.  Follow up as needed.     I have personally reviewed and noted the following in the patient's chart:   . Medical and social history . Use of alcohol, tobacco or illicit drugs  . Current medications and supplements . Functional ability and status . Nutritional status . Physical activity . Advanced directives . List of other physicians . Hospitalizations, surgeries, and ER visits in previous 12 months . Vitals . Screenings to include cognitive, depression, and falls . Referrals and appointments  In addition, I have reviewed and discussed with patient certain preventive protocols, quality metrics, and best practice recommendations. A written personalized care plan for preventive services as well as general preventive health recommendations were provided to patient.     Ashok Pall, LPN   16/03/958

## 2017-12-23 NOTE — Patient Instructions (Addendum)
  Bonnie Hinton , Thank you for taking time to come for your Medicare Wellness Visit. I appreciate your ongoing commitment to your health goals. Please review the following plan we discussed and let me know if I can assist you in the future.   These are the goals we discussed: Goals      Patient Stated   . Low Carb/Low Cholesterol Diet (pt-stated)       This is a list of the screening recommended for you and due dates:  Health Maintenance  Topic Date Due  . Tetanus Vaccine  01/10/1960  . DEXA scan (bone density measurement)  01/09/2006  . Pneumonia vaccines (2 of 2 - PPSV23) 04/24/2016  . Flu Shot  10/23/2017  . Urine Protein Check  04/24/2018  . Hemoglobin A1C  05/15/2018  . Eye exam for diabetics  10/01/2018  . Complete foot exam   11/13/2018   Bone Densitometry Bone densitometry is an imaging test that uses a special X-ray to measure the amount of calcium and other minerals in your bones (bone density). This test is also known as a bone mineral density test or dual-energy X-ray absorptiometry (DXA). The test can measure bone density at your hip and your spine. It is similar to having a regular X-ray. You may have this test to:  Diagnose a condition that causes weak or thin bones (osteoporosis).  Predict your risk of a broken bone (fracture).  Determine how well osteoporosis treatment is working.  Tell a health care provider about:  Any allergies you have.  All medicines you are taking, including vitamins, herbs, eye drops, creams, and over-the-counter medicines.  Any problems you or family members have had with anesthetic medicines.  Any blood disorders you have.  Any surgeries you have had.  Any medical conditions you have.  Possibility of pregnancy.  Any other medical test you had within the previous 14 days that used contrast material. What are the risks? Generally, this is a safe procedure. However, problems can occur and may include the following:  This test  exposes you to a very small amount of radiation.  The risks of radiation exposure may be greater to unborn children.  What happens before the procedure?  Do not take any calcium supplements for 24 hours before having the test. You can otherwise eat and drink what you usually do.  Take off all metal jewelry, eyeglasses, dental appliances, and any other metal objects. What happens during the procedure?  You may lie on an exam table. There will be an X-ray generator below you and an imaging device above you.  Other devices, such as boxes or braces, may be used to position your body properly for the scan.  You will need to lie still while the machine slowly scans your body.  The images will show up on a computer monitor. What happens after the procedure? You may need more testing at a later time. This information is not intended to replace advice given to you by your health care provider. Make sure you discuss any questions you have with your health care provider. Document Released: 04/02/2004 Document Revised: 08/17/2015 Document Reviewed: 08/19/2013 Elsevier Interactive Patient Education  2018 [employer].

## 2017-12-24 NOTE — Progress Notes (Signed)
Note reviewed. No acute issues/concerns from patient. She will follow up with PCP

## 2018-02-05 ENCOUNTER — Ambulatory Visit: Payer: Medicare Other | Admitting: Internal Medicine

## 2018-02-05 ENCOUNTER — Encounter: Payer: Self-pay | Admitting: Internal Medicine

## 2018-02-05 VITALS — BP 160/86 | HR 91 | Temp 97.8°F | Resp 16 | Ht 65.0 in | Wt 330.1 lb

## 2018-02-05 DIAGNOSIS — E1121 Type 2 diabetes mellitus with diabetic nephropathy: Secondary | ICD-10-CM

## 2018-02-05 DIAGNOSIS — E118 Type 2 diabetes mellitus with unspecified complications: Secondary | ICD-10-CM | POA: Diagnosis not present

## 2018-02-05 DIAGNOSIS — Z23 Encounter for immunization: Secondary | ICD-10-CM | POA: Diagnosis not present

## 2018-02-05 DIAGNOSIS — E782 Mixed hyperlipidemia: Secondary | ICD-10-CM | POA: Diagnosis not present

## 2018-02-05 DIAGNOSIS — K580 Irritable bowel syndrome with diarrhea: Secondary | ICD-10-CM

## 2018-02-05 DIAGNOSIS — E039 Hypothyroidism, unspecified: Secondary | ICD-10-CM

## 2018-02-05 DIAGNOSIS — I10 Essential (primary) hypertension: Secondary | ICD-10-CM

## 2018-02-05 LAB — COMPREHENSIVE METABOLIC PANEL
ALT: 20 U/L (ref 0–35)
AST: 23 U/L (ref 0–37)
Albumin: 4.4 g/dL (ref 3.5–5.2)
Alkaline Phosphatase: 76 U/L (ref 39–117)
BUN: 19 mg/dL (ref 6–23)
CO2: 29 mEq/L (ref 19–32)
Calcium: 9.9 mg/dL (ref 8.4–10.5)
Chloride: 99 mEq/L (ref 96–112)
Creatinine, Ser: 0.89 mg/dL (ref 0.40–1.20)
GFR: 65.35 mL/min (ref >60.00)
Glucose, Bld: 148 mg/dL — ABNORMAL HIGH (ref 70–99)
Potassium: 4.4 mEq/L (ref 3.5–5.1)
Sodium: 136 mEq/L (ref 135–145)
Total Bilirubin: 0.4 mg/dL (ref 0.2–1.2)
Total Protein: 8 g/dL (ref 6.0–8.3)

## 2018-02-05 LAB — LIPID PANEL
Cholesterol: 283 mg/dL — ABNORMAL HIGH (ref 0–200)
HDL: 43.6 mg/dL (ref >39.00)
NonHDL: 239.51
Total CHOL/HDL Ratio: 6
Triglycerides: 268 mg/dL — ABNORMAL HIGH (ref 0.0–149.0)
VLDL: 53.6 mg/dL — ABNORMAL HIGH (ref 0.0–40.0)

## 2018-02-05 LAB — MICROALBUMIN / CREATININE URINE RATIO
Creatinine,U: 144.6 mg/dL
Microalb Creat Ratio: 4.1 mg/g (ref 0.0–30.0)
Microalb, Ur: 6 mg/dL — ABNORMAL HIGH (ref 0.0–1.9)

## 2018-02-05 LAB — LDL CHOLESTEROL, DIRECT: Direct LDL: 211 mg/dL

## 2018-02-05 MED ORDER — AMLODIPINE BESYLATE 10 MG PO TABS: 10.0000 mg | ORAL_TABLET | Freq: Every day | ORAL | 3 refills | Status: DC

## 2018-02-05 NOTE — Patient Instructions (Addendum)
Bonnie Hinton makes a Salted Caramel   Protein powder that blends weil with unsweetened vanilla almond  Milk ,  Add a handful of ice cubes and walnuts ( or whatever  nut your blender can chew up)    Avoid ripe  bananas and canned or frozen corn on a daily basis . Both are high in sugar   You are not due for a1c until after Nov 21st.  Your sugars are most accurate fasting (in the morning before eating) and 2 hours AFTER a meal    Your gaol blood pressure is 130/80 or less.   I am increasing your amlodipine to 10 mg daily

## 2018-02-05 NOTE — Progress Notes (Signed)
Subjective:  Patient ID: Bonnie Hinton, female    DOB: [birth year]  Age: 77 y.o. MRN: [id number]  CC: The primary encounter diagnosis was Diabetic nephropathy associated with type 2 diabetes mellitus (HCC). Diagnoses of Mixed hyperlipidemia, Need for 23-polyvalent pneumococcal polysaccharide vaccine, Acquired hypothyroidism, Diabetes mellitus with complication (HCC), Essential hypertension, benign, and Irritable bowel syndrome with diarrhea were also pertinent to this visit.  HPI Bonnie Hinton presents for 3 month follow up on multiple conditions including diarrhea,  Diabetes and hypothryoidism   Diarrhea:  Became constipated with use of Immodium.  Using bentyl.  Aggravated by eating salads. Bentyl sometimes helps.   Diabetes:  Continues to be unsure of proper diet.    Hypertension: patient checks blood pressure daily at home.  Readings have been for the most part > 140/80 at rest . Patient is not following a reduced salt or low glycemic diet  and is taking amlodipine 5 mg dialy  as prescribedTaking amlodipine at night  5 mg for the last 3 weeks. Blood pressures in the  morniing have improved but are not at goal    Outpatient Medications Prior to Visit  Medication Sig Dispense Refill  . acetaminophen (TYLENOL) 325 MG tablet Take 2 tablets (650 mg total) by mouth every 6 (six) hours as needed for mild pain (or Fever >/= 101).    . celecoxib (CELEBREX) 200 MG capsule Take 1 capsule (200 mg total) by mouth 2 (two) times daily. 180 capsule 0  . Cholecalciferol (VITAMIN D3) 2000 units TABS Take 1 tablet by mouth daily.    . cycloSPORINE (RESTASIS) 0.05 % ophthalmic emulsion Place 1 drop into both eyes 2 (two) times daily.    Marland Kitchen. dicyclomine (BENTYL) 20 MG tablet TAKE 1 TABLET FOUR TIMES A DAY BEFORE MEALS AND AT BEDTIME 270 tablet 5  . furosemide (LASIX) 20 MG tablet TAKE 1 TABLET BY MOUTH DAILY AS NEEDED. FOR FLUID RETENTION NOT MORE THAN EVERY OTHER DAY 45 tablet 1  . levothyroxine  (SYNTHROID, LEVOTHROID) 112 MCG tablet Take 1 tablet (112 mcg total) by mouth daily before breakfast. 90 tablet 0  . LUMIGAN 0.01 % SOLN Place 1-2 drops into both eyes at bedtime.    Marland Kitchen. nystatin cream (MYCOSTATIN) Apply 1 application topically 2 (two) times daily. 90 g 2  . pantoprazole (PROTONIX) 40 MG tablet TAKE 1 TABLET DAILY 90 tablet 3  . tiZANidine (ZANAFLEX) 4 MG capsule TAKE 1 CAPSULE THREE TIMES A DAY AS NEEDED FOR MUSCLE SPASMS 90 capsule 3  . tolterodine (DETROL LA) 4 MG 24 hr capsule Take 1 capsule (4 mg total) by mouth 2 (two) times daily. 180 capsule 3  . amLODipine (NORVASC) 5 MG tablet TAKE 1 TABLET DAILY 90 tablet 1  . amoxicillin (AMOXIL) 500 MG capsule TAKE 4 CAPSULES BY MOUTH 1 HOUR PRIOR TO DENTAL PROCEDURE (Patient not taking: Reported on 02/05/2018) 20 capsule 2   No facility-administered medications prior to visit.     Review of Systems;  Patient denies headache, fevers, malaise, unintentional weight loss, skin rash, eye pain, sinus congestion and sinus pain, sore throat, dysphagia,  hemoptysis , cough, dyspnea, wheezing, chest pain, palpitations, orthopnea, edema, abdominal pain, nausea, melena, constipation, flank pain, dysuria, hematuria, urinary  Frequency, nocturia, numbness, tingling, seizures,  Focal weakness, Loss of consciousness,  Tremor, insomnia, depression, anxiety, and suicidal ideation.      Objective:  BP (!) 160/86 (BP Location: Left Arm, Patient Position: Sitting, Cuff Size: Large)   Pulse 91  Temp 97.8 F (36.6 C) (Oral)   Resp 16   Ht 5\' 5"  (1.651 m)   Wt (!) 330 lb 1.9 oz (149.7 kg)   SpO2 98%   BMI 54.93 kg/m   BP Readings from Last 3 Encounters:  02/05/18 (!) 160/86  12/23/17 126/78  11/12/17 126/70    Wt Readings from Last 3 Encounters:  02/05/18 (!) 330 lb 1.9 oz (149.7 kg)  12/23/17 (!) 330 lb 6.4 oz (149.9 kg)  11/12/17 (!) 332 lb (150.6 kg)    General appearance: alert, cooperative and appears stated age Ears: normal TM's  and external ear canals both ears Throat: lips, mucosa, and tongue normal; teeth and gums normal Neck: no adenopathy, no carotid bruit, supple, symmetrical, trachea midline and thyroid not enlarged, symmetric, no tenderness/mass/nodules Back: symmetric, no curvature. ROM normal. No CVA tenderness. Lungs: clear to auscultation bilaterally Heart: regular rate and rhythm, S1, S2 normal, no murmur, click, rub or gallop Abdomen: soft, non-tender; bowel sounds normal; no masses,  no organomegaly Pulses: 2+ and symmetric Skin: Skin color, texture, turgor normal. No rashes or lesions Lymph nodes: Cervical, supraclavicular, and axillary nodes normal.  Lab Results  Component Value Date   HGBA1C 7.3 (H) 11/12/2017   HGBA1C 7.3 (H) 07/30/2017   HGBA1C 6.3 12/25/2016    Lab Results  Component Value Date   CREATININE 0.89 02/05/2018   CREATININE 0.89 12/23/2017   CREATININE 0.96 11/12/2017    Lab Results  Component Value Date   WBC 8.9 09/24/2016   HGB 13.1 09/24/2016   HCT 41 09/24/2016   PLT 452 (A) 09/24/2016   GLUCOSE 148 (H) 02/05/2018   CHOL 283 (H) 02/05/2018   TRIG 268.0 (H) 02/05/2018   HDL 43.60 02/05/2018   LDLDIRECT 211.0 02/05/2018   LDLCALC 210 (H) 12/25/2016   ALT 20 02/05/2018   AST 23 02/05/2018   NA 136 02/05/2018   K 4.4 02/05/2018   CL 99 02/05/2018   CREATININE 0.89 02/05/2018   BUN 19 02/05/2018   CO2 29 02/05/2018   TSH 5.35 (H) 11/12/2017   INR 1.37 09/04/2016   HGBA1C 7.3 (H) 11/12/2017   MICROALBUR 6.0 (H) 02/05/2018    Assessment & Plan:   Problem List Items Addressed This Visit    Acquired hypothyroidism    She is due for repeat testing after dose was increased in August   Lab Results  Component Value Date   TSH 5.35 (H) 11/12/2017         Diabetes mellitus with complication (HCC)    Less  well-controlled on diet alone.  Victoza was stopped during prior  hospitalization for pancreatitis and suspected to be causative.   Patient is  up-to-date on eye exams and foot exam is normal today.   Lab Results  Component Value Date   HGBA1C 7.3 (H) 11/12/2017   Lab Results  Component Value Date   MICROALBUR 6.0 (H) 02/05/2018       Discussed current diet in detail . Low GI choices discussed.  no medication  changes return in one week for a1c         Relevant Medications   atorvastatin (LIPITOR) 20 MG tablet   Diabetic nephropathy associated with type 2 diabetes mellitus (HCC) - Primary   Relevant Medications   atorvastatin (LIPITOR) 20 MG tablet   Other Relevant Orders   Comprehensive metabolic panel (Completed)   Microalbumin / creatinine urine ratio (Completed)   Essential hypertension, benign    Not at goal.  Increasing  amlodipine to 10 mg daily       Relevant Medications   amLODipine (NORVASC) 10 MG tablet   atorvastatin (LIPITOR) 20 MG tablet   Hyperlipidemia    Using the Framingham risk calculator,  Her 10 year risk of coronary artery disease is 53%.statin advised.   Lab Results  Component Value Date   CHOL 283 (H) 02/05/2018   HDL 43.60 02/05/2018   LDLCALC 210 (H) 12/25/2016   LDLDIRECT 211.0 02/05/2018   TRIG 268.0 (H) 02/05/2018   CHOLHDL 6 02/05/2018         Relevant Medications   amLODipine (NORVASC) 10 MG tablet   atorvastatin (LIPITOR) 20 MG tablet   Other Relevant Orders   Lipid panel (Completed)   LDL cholesterol, direct (Completed)   IBS (irritable bowel syndrome)    Post cholecystectomy.  Managed with  bentyl        Other Visit Diagnoses    Need for 23-polyvalent pneumococcal polysaccharide vaccine       Relevant Orders   Pneumococcal polysaccharide vaccine 23-valent greater than or equal to 2yo subcutaneous/IM (Completed)     A total of 25 minutes of face to face time was spent with patient more than half of which was spent in counselling about the above mentioned conditions  and coordination of care   I have discontinued Bonnie Hinton's amLODipine. I am also  having her start on amLODipine and atorvastatin. Additionally, I am having her maintain her acetaminophen, cycloSPORINE, LUMIGAN, nystatin cream, pantoprazole, Vitamin D3, celecoxib, tolterodine, amoxicillin, tiZANidine, levothyroxine, dicyclomine, and furosemide.  Meds ordered this encounter  Medications  . amLODipine (NORVASC) 10 MG tablet    Sig: Take 1 tablet (10 mg total) by mouth daily.    Dispense:  90 tablet    Refill:  3  . atorvastatin (LIPITOR) 20 MG tablet    Sig: Take 1 tablet (20 mg total) by mouth daily.    Dispense:  90 tablet    Refill:  3    Medications Discontinued During This Encounter  Medication Reason  . amLODipine (NORVASC) 5 MG tablet     Follow-up: Return in about 4 months (around 06/06/2018) for follow up diabetes.   Sherlene Shams, MD

## 2018-02-07 MED ORDER — ATORVASTATIN CALCIUM 20 MG PO TABS: 20.0000 mg | ORAL_TABLET | Freq: Every day | ORAL | 3 refills | Status: DC

## 2018-02-07 NOTE — Assessment & Plan Note (Addendum)
Less  well-controlled on diet alone.  Victoza was stopped during prior  hospitalization for pancreatitis and suspected to be causative.   Patient is up-to-date on eye exams and foot exam is normal today.   Lab Results  Component Value Date   HGBA1C 7.3 (H) 11/12/2017   Lab Results  Component Value Date   MICROALBUR 6.0 (H) 02/05/2018       Discussed current diet in detail . Low GI choices discussed.  no medication  changes return in one week for a1c

## 2018-02-07 NOTE — Assessment & Plan Note (Signed)
She is due for repeat testing after dose was increased in August   Lab Results  Component Value Date   TSH 5.35 (H) 11/12/2017

## 2018-02-07 NOTE — Assessment & Plan Note (Signed)
Not at goal.  Increasing amlodipine to 10 mg daily  

## 2018-02-07 NOTE — Assessment & Plan Note (Signed)
Post cholecystectomy.  Managed with  bentyl

## 2018-02-07 NOTE — Assessment & Plan Note (Signed)
Using the Framingham risk calculator,  Her 10 year risk of coronary artery disease is 53%.statin advised.   Lab Results  Component Value Date   CHOL 283 (H) 02/05/2018   HDL 43.60 02/05/2018   LDLCALC 210 (H) 12/25/2016   LDLDIRECT 211.0 02/05/2018   TRIG 268.0 (H) 02/05/2018   CHOLHDL 6 02/05/2018

## 2018-02-10 ENCOUNTER — Other Ambulatory Visit: Payer: Self-pay | Admitting: Internal Medicine

## 2018-02-10 MED ORDER — ROSUVASTATIN CALCIUM 5 MG PO TABS: 5.0000 mg | ORAL_TABLET | Freq: Every day | ORAL | 0 refills | Status: DC

## 2018-02-12 ENCOUNTER — Ambulatory Visit: Payer: Self-pay | Admitting: *Deleted

## 2018-02-12 NOTE — Telephone Encounter (Signed)
Pt reports burning pain at peri area; "Where pee comes out of." Constant, not with urination. States "It's from the Lipitor, I can't take that." Pt states she took first dose of Lipitor at 1200 today. Burning began minutes prior to triage call. Denies swelling, states is urinating, no retention. Denies any rash, back pain. Burning 10/10; Pt sounds distressed. Tearful during call. Directed to ED, refuses. States "I'll put a cool wash cloth on the area." States "I want Dr. Darrick Huntsmanullo to know I won't take any more Lipitor." States similar symptoms when took "Years ago."  Care advise given; instructed to go to ED if symptoms worsen, swelling, rash occurs, any urinary retention, fever, hematuria. Verbalizes understanding.   Reason for Disposition . Patient sounds very sick or weak to the triager  Answer Assessment - Initial Assessment Questions 1. SYMPTOM: "What's the main symptom you're concerned about?" (e.g., frequency, incontinence)     Burning at "urethra area"  2. ONSET: "When did the  *No Answer*  start?"     "Couple of minutes ago"  Took Lipitor at 1200 "It's from that." 3. PAIN: "Is there any pain?" If so, ask: "How bad is it?" (Scale: 1-10; mild, moderate, severe)     10/10 4. CAUSE: "What do you think is causing the symptoms?"     Lipitor 5. OTHER SYMPTOMS: "Do you have any other symptoms?" (e.g., fever, flank pain, blood in urine, pain with urination)     no  Protocols used: URINATION PAIN - FEMALE-A-AH, URINARY SYMPTOMS-A-AH

## 2018-02-13 NOTE — Telephone Encounter (Signed)
FYI

## 2018-02-13 NOTE — Telephone Encounter (Signed)
Spoke with pt to advise her not to take the atorvastatin any more and to see how the pt was doing. The pt stated that she is better this morning and will not be taking anymore of the medication. I also let the pt know that we called [facility] when we got her original message in regards to the reaction she has when taking the medication to cancel the rx and sent in a new medication called Crestor. Pt gave a verbal understanding.

## 2018-02-16 ENCOUNTER — Ambulatory Visit: Payer: Medicare Other | Admitting: Podiatry

## 2018-02-16 ENCOUNTER — Encounter: Payer: Self-pay | Admitting: Podiatry

## 2018-02-16 DIAGNOSIS — B351 Tinea unguium: Secondary | ICD-10-CM

## 2018-02-16 DIAGNOSIS — E1142 Type 2 diabetes mellitus with diabetic polyneuropathy: Secondary | ICD-10-CM

## 2018-02-16 DIAGNOSIS — M79676 Pain in unspecified toe(s): Secondary | ICD-10-CM | POA: Diagnosis not present

## 2018-02-16 NOTE — Progress Notes (Signed)
Complaint:  Visit Type: Patient returns to my office for continued preventative foot care services. Complaint: Patient states" my nails have grown long and thick and become painful to walk and wear shoes" Patient has been diagnosed with DM with neuropathy. The patient presents for preventative foot care services. No changes to ROS  Podiatric Exam: Vascular: dorsalis pedis and posterior tibial pulses are palpable bilateral. Capillary return is immediate. Temperature gradient is WNL. Skin turgor WNL  Sensorium: Normal Semmes Weinstein monofilament test. Normal tactile sensation bilaterally. Nail Exam: Pt has thick disfigured discolored nails with subungual debris noted bilateral entire nail hallux through fifth toenails Ulcer Exam: There is no evidence of ulcer or pre-ulcerative changes or infection. Orthopedic Exam: Muscle tone and strength are WNL. No limitations in general ROM. No crepitus or effusions noted. Contracted digits  B/L.   Severe PTTD left foot.  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  Hammer toes 2,3 right. Skin: No Porokeratosis. No infection or ulcers  Diagnosis:  Onychomycosis, , Pain in right toe, pain in left toes,  Diabetes with neuropathy.  Treatment & Plan Procedures and Treatment: Consent by patient was obtained for treatment procedures.   Debridement of mycotic and hypertrophic toenails, 1 through 5 bilateral and clearing of subungual debris. No ulceration, no infection noted.  Return Visit-Office Procedure: Patient instructed to return to the office for a follow up visit 3 months for continued evaluation and treatment.    Layana Konkel DPM 

## 2018-02-18 ENCOUNTER — Other Ambulatory Visit: Payer: Self-pay | Admitting: Internal Medicine

## 2018-02-23 ENCOUNTER — Other Ambulatory Visit: Payer: Self-pay | Admitting: Internal Medicine

## 2018-02-23 MED ORDER — LEVOTHYROXINE SODIUM 112 MCG PO TABS: 112.0000 ug | ORAL_TABLET | Freq: Every day | ORAL | 0 refills | Status: DC

## 2018-02-23 NOTE — Telephone Encounter (Signed)
Patient is requesting a call back to understand why med was previously refused.  Copied from CRM [phone]. Topic: Quick Communication - Rx Refill/Question >> Feb 23, 2018  7:56 AM [name], [name] wrote: Medication: levothyroxine (SYNTHROID, LEVOTHROID) 112 MCG tablet  Has the patient contacted their pharmacy? Yes  Preferred Pharmacy (with phone number or street name): [id number], [place] - [address] [phone] (Phone) [phone] (Fax)   Agent: Please be advised that RX refills may take up to 3 business days. We ask that you follow-up with your pharmacy.

## 2018-02-23 NOTE — Telephone Encounter (Signed)
Courtesy refill sent to pharmacy Requested Prescriptions  Pending Prescriptions Disp Refills  . levothyroxine (SYNTHROID, LEVOTHROID) 112 MCG tablet 90 tablet 0    Sig: Take 1 tablet (112 mcg total) by mouth daily before breakfast.     Endocrinology:  Hypothyroid Agents Failed - 02/23/2018  8:41 AM      Failed - TSH needs to be rechecked within 3 months after an abnormal result. Refill until TSH is due.      Failed - TSH in normal range and within 360 days    TSH  Date Value Ref Range Status  11/12/2017 5.35 (H) 0.35 - 4.50 uIU/mL Final         Passed - Valid encounter within last 12 months    Recent Outpatient Visits          2 weeks ago Diabetic nephropathy associated with type 2 diabetes mellitus (HCC)   [facility] [place] Sherlene Shamsullo, Teresa L, MD   3 months ago Mixed hyperlipidemia   [facility] [place] Sherlene Shamsullo, Teresa L, MD   8 months ago Diabetes mellitus with complication [facility](HCC)   [facility] [place] Sherlene Shamsullo, Teresa L, MD   1 year ago Controlled type 2 diabetes mellitus without complication, without long-term current use of insulin (HCC)   [facility] [place] Sherlene Shamsullo, Teresa L, MD   1 year ago Long-term use of high-risk medication   [facility]  Sherlene Shamsullo, Teresa L, MD      Future Appointments            In 3 months Darrick Huntsmanullo, Mar Daringeresa L, MD [facility] [place]   In 10 months O'Brien-Blaney, Vivianne Spenceenisa L, LPN  [facility] [place] [place]

## 2018-02-23 NOTE — Telephone Encounter (Signed)
Patient reports she has been out of thyroid medication for 3 days- she has a lab appointment 12/16. Will fill for patient so she will have in her system for her lab draw- she states she has been feeling better on this dose.

## 2018-02-26 ENCOUNTER — Other Ambulatory Visit: Payer: Self-pay

## 2018-02-26 MED ORDER — LEVOTHYROXINE SODIUM 112 MCG PO TABS: 112.0000 ug | ORAL_TABLET | Freq: Every day | ORAL | 0 refills | Status: DC

## 2018-02-27 ENCOUNTER — Other Ambulatory Visit: Payer: Self-pay

## 2018-02-27 MED ORDER — TIZANIDINE HCL 4 MG PO CAPS: ORAL_CAPSULE | ORAL | 1 refills | Status: DC

## 2018-03-05 ENCOUNTER — Ambulatory Visit: Payer: Self-pay

## 2018-03-05 DIAGNOSIS — Z789 Other specified health status: Secondary | ICD-10-CM | POA: Insufficient documentation

## 2018-03-05 MED ORDER — EZETIMIBE 10 MG PO TABS: 10.0000 mg | ORAL_TABLET | Freq: Every day | ORAL | 5 refills | Status: DC

## 2018-03-05 NOTE — Telephone Encounter (Signed)
Chart updated with statin intolerance as a problem.    If she is  willing to try Zetia, she may tolerate it better than the statins that she did not tolerate previously.  It works by inhibiting the absorption of cholesterol by the small intestine, so it lowers LDL .   I will send it to your pharmacy

## 2018-03-05 NOTE — Telephone Encounter (Signed)
Could pt try a cholesterol medication that is not a statin?

## 2018-03-05 NOTE — Telephone Encounter (Signed)
Pt. Called to report every time she takes the Crestor, 2 hours later she starts hurting all over, muscles are achy, has no energy. "I can not function like this. The pharmacist told me there are medications that are not statins that we could try." Please advise pt.

## 2018-03-05 NOTE — Assessment & Plan Note (Signed)
Repeated trials have caused myalgias and malaise.  Trial of zetia accepted.

## 2018-03-06 NOTE — Telephone Encounter (Signed)
LMTCB. [facility] may speak with pt.  

## 2018-03-09 ENCOUNTER — Other Ambulatory Visit (INDEPENDENT_AMBULATORY_CARE_PROVIDER_SITE_OTHER): Payer: Medicare Other

## 2018-03-09 ENCOUNTER — Other Ambulatory Visit: Payer: Medicare Other

## 2018-03-09 DIAGNOSIS — E118 Type 2 diabetes mellitus with unspecified complications: Secondary | ICD-10-CM | POA: Diagnosis not present

## 2018-03-09 DIAGNOSIS — E039 Hypothyroidism, unspecified: Secondary | ICD-10-CM

## 2018-03-09 LAB — COMPREHENSIVE METABOLIC PANEL
ALT: 17 U/L (ref 0–35)
AST: 26 U/L (ref 0–37)
Albumin: 4.1 g/dL (ref 3.5–5.2)
Alkaline Phosphatase: 57 U/L (ref 39–117)
BUN: 20 mg/dL (ref 6–23)
CO2: 27 mEq/L (ref 19–32)
Calcium: 9.2 mg/dL (ref 8.4–10.5)
Chloride: 100 mEq/L (ref 96–112)
Creatinine, Ser: 0.84 mg/dL (ref 0.40–1.20)
GFR: 69.85 mL/min (ref >60.00)
Glucose, Bld: 156 mg/dL — ABNORMAL HIGH (ref 70–99)
Potassium: 4.5 mEq/L (ref 3.5–5.1)
Sodium: 136 mEq/L (ref 135–145)
Total Bilirubin: 0.4 mg/dL (ref 0.2–1.2)
Total Protein: 7.5 g/dL (ref 6.0–8.3)

## 2018-03-09 LAB — TSH: TSH: 6.86 u[IU]/mL — ABNORMAL HIGH (ref 0.35–4.50)

## 2018-03-09 LAB — HEMOGLOBIN A1C: Hgb A1c MFr Bld: 7.4 % — ABNORMAL HIGH (ref 4.6–6.5)

## 2018-03-11 ENCOUNTER — Other Ambulatory Visit: Payer: Medicare Other

## 2018-03-13 ENCOUNTER — Telehealth: Payer: Self-pay | Admitting: *Deleted

## 2018-03-13 ENCOUNTER — Other Ambulatory Visit: Payer: Self-pay | Admitting: Internal Medicine

## 2018-03-13 DIAGNOSIS — E039 Hypothyroidism, unspecified: Secondary | ICD-10-CM

## 2018-03-13 MED ORDER — LEVOTHYROXINE SODIUM 125 MCG PO TABS: 125.0000 ug | ORAL_TABLET | Freq: Every day | ORAL | 1 refills | Status: DC

## 2018-03-13 NOTE — Telephone Encounter (Signed)
See previous message

## 2018-03-13 NOTE — Telephone Encounter (Signed)
Copied from CRM [id number]. Topic: General - Other >> Mar 13, 2018 11:28 AM Gaynelle AduPoole, Shalonda wrote: Reason for CRM:  patient stated she received a missed call, and unsure who called. Please  advise >> Mar 13, 2018 11:35 AM Jilda Rocheemaray, Melissa wrote: Patient states to call her home phone her cell phone is dead  The number is [phone]

## 2018-03-13 NOTE — Assessment & Plan Note (Signed)
DOSE increased to 125 mcg daily and [facility]

## 2018-03-13 NOTE — Progress Notes (Signed)
DOSE increased to 125 mcg daily and [facility]  

## 2018-03-19 NOTE — Telephone Encounter (Signed)
Spoke with pt and she stated that she has taken this medication in the past and never had any trouble with it. She stated that she has been taking it now for several days and not had any problems.

## 2018-05-06 ENCOUNTER — Other Ambulatory Visit: Payer: Self-pay | Admitting: Internal Medicine

## 2018-05-18 ENCOUNTER — Ambulatory Visit: Payer: Medicare Other | Admitting: Podiatry

## 2018-06-01 ENCOUNTER — Ambulatory Visit: Payer: Medicare Other | Admitting: Podiatry

## 2018-06-01 ENCOUNTER — Encounter: Payer: Self-pay | Admitting: Podiatry

## 2018-06-01 DIAGNOSIS — E1142 Type 2 diabetes mellitus with diabetic polyneuropathy: Secondary | ICD-10-CM

## 2018-06-01 DIAGNOSIS — M79676 Pain in unspecified toe(s): Secondary | ICD-10-CM

## 2018-06-01 DIAGNOSIS — B351 Tinea unguium: Secondary | ICD-10-CM | POA: Diagnosis not present

## 2018-06-01 NOTE — Progress Notes (Signed)
Complaint:  Visit Type: Patient returns to my office for continued preventative foot care services. Complaint: Patient states" my nails have grown long and thick and become painful to walk and wear shoes" Patient has been diagnosed with DM with neuropathy. The patient presents for preventative foot care services. No changes to ROS  Podiatric Exam: Vascular: dorsalis pedis and posterior tibial pulses are palpable bilateral. Capillary return is immediate. Temperature gradient is WNL. Skin turgor WNL  Sensorium: Normal Semmes Weinstein monofilament test. Normal tactile sensation bilaterally. Nail Exam: Pt has thick disfigured discolored nails with subungual debris noted bilateral entire nail hallux through fifth toenails Ulcer Exam: There is no evidence of ulcer or pre-ulcerative changes or infection. Orthopedic Exam: Muscle tone and strength are WNL. No limitations in general ROM. No crepitus or effusions noted. Contracted digits  B/L.   Severe PTTD left foot.  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  Hammer toes 2,3 right. Skin: No Porokeratosis. No infection or ulcers  Diagnosis:  Onychomycosis, , Pain in right toe, pain in left toes,  Diabetes with neuropathy.  Treatment & Plan Procedures and Treatment: Consent by patient was obtained for treatment procedures.   Debridement of mycotic and hypertrophic toenails, 1 through 5 bilateral and clearing of subungual debris. No ulceration, no infection noted.  Return Visit-Office Procedure: Patient instructed to return to the office for a follow up visit 3 months for continued evaluation and treatment.    Mckinleigh Schuchart DPM 

## 2018-06-05 ENCOUNTER — Other Ambulatory Visit: Payer: Self-pay | Admitting: Internal Medicine

## 2018-06-10 ENCOUNTER — Other Ambulatory Visit: Payer: Self-pay

## 2018-06-10 ENCOUNTER — Encounter: Payer: Self-pay | Admitting: Internal Medicine

## 2018-06-10 ENCOUNTER — Ambulatory Visit: Payer: Medicare Other | Admitting: Internal Medicine

## 2018-06-10 VITALS — BP 140/90 | HR 87 | Temp 98.1°F | Wt 283.0 lb

## 2018-06-10 DIAGNOSIS — H6500 Acute serous otitis media, unspecified ear: Secondary | ICD-10-CM | POA: Diagnosis not present

## 2018-06-10 DIAGNOSIS — E1121 Type 2 diabetes mellitus with diabetic nephropathy: Secondary | ICD-10-CM

## 2018-06-10 DIAGNOSIS — H65113 Acute and subacute allergic otitis media (mucoid) (sanguinous) (serous), bilateral: Secondary | ICD-10-CM

## 2018-06-10 DIAGNOSIS — E118 Type 2 diabetes mellitus with unspecified complications: Secondary | ICD-10-CM

## 2018-06-10 DIAGNOSIS — E039 Hypothyroidism, unspecified: Secondary | ICD-10-CM

## 2018-06-10 DIAGNOSIS — I10 Essential (primary) hypertension: Secondary | ICD-10-CM

## 2018-06-10 DIAGNOSIS — E782 Mixed hyperlipidemia: Secondary | ICD-10-CM

## 2018-06-10 LAB — CBC WITH DIFFERENTIAL/PLATELET
Basophils Absolute: 0.1 10*3/uL (ref 0.0–0.1)
Basophils Relative: 1 % (ref 0.0–3.0)
Eosinophils Absolute: 0.3 10*3/uL (ref 0.0–0.7)
Eosinophils Relative: 3.1 % (ref 0.0–5.0)
HCT: 45.1 % (ref 36.0–46.0)
Hemoglobin: 14.9 g/dL (ref 12.0–15.0)
Lymphocytes Relative: 23.4 % (ref 12.0–46.0)
Lymphs Abs: 2 10*3/uL (ref 0.7–4.0)
MCHC: 33 g/dL (ref 30.0–36.0)
MCV: 91.4 fl (ref 78.0–100.0)
Monocytes Absolute: 0.5 10*3/uL (ref 0.1–1.0)
Monocytes Relative: 6.1 % (ref 3.0–12.0)
Neutro Abs: 5.6 10*3/uL (ref 1.4–7.7)
Neutrophils Relative %: 66.4 % (ref 43.0–77.0)
Platelets: 306 10*3/uL (ref 150.0–400.0)
RBC: 4.94 Mil/uL (ref 3.87–5.11)
RDW: 14 % (ref 11.5–15.5)
WBC: 8.4 10*3/uL (ref 4.0–10.5)

## 2018-06-10 LAB — LIPID PANEL
Cholesterol: 264 mg/dL — ABNORMAL HIGH (ref 0–200)
HDL: 49.3 mg/dL (ref >39.00)
NonHDL: 215.13
Total CHOL/HDL Ratio: 5
Triglycerides: 254 mg/dL — ABNORMAL HIGH (ref 0.0–149.0)
VLDL: 50.8 mg/dL — ABNORMAL HIGH (ref 0.0–40.0)

## 2018-06-10 LAB — LDL CHOLESTEROL, DIRECT: Direct LDL: 177 mg/dL

## 2018-06-10 LAB — HEMOGLOBIN A1C: Hgb A1c MFr Bld: 7.7 % — ABNORMAL HIGH (ref 4.6–6.5)

## 2018-06-10 LAB — TSH: TSH: 4.41 u[IU]/mL (ref 0.35–4.50)

## 2018-06-10 MED ORDER — AMOXICILLIN-POT CLAVULANATE 875-125 MG PO TABS: 1.0000 | ORAL_TABLET | Freq: Two times a day (BID) | ORAL | 0 refills | Status: DC

## 2018-06-10 MED ORDER — CELECOXIB 100 MG PO CAPS: 100.0000 mg | ORAL_CAPSULE | Freq: Two times a day (BID) | ORAL | 1 refills | Status: DC

## 2018-06-10 MED ORDER — METHYLPREDNISOLONE ACETATE 40 MG/ML IJ SUSP
40.0000 mg | Freq: Once | INTRAMUSCULAR | Status: AC
  Administered 2018-06-10: 40 mg via INTRAMUSCULAR

## 2018-06-10 NOTE — Progress Notes (Signed)
Subjective:  Patient ID: Bonnie Hinton, female    DOB: Apr 03, 1940  Age: 78 y.o. MRN: [id number]  CC: The primary encounter diagnosis was Diabetes mellitus with complication (HCC). Diagnoses of Acquired hypothyroidism, Acute serous otitis media, recurrence not specified, unspecified laterality, Non-recurrent acute allergic otitis media of both ears, Mixed hyperlipidemia, Essential hypertension, benign, and Diabetic nephropathy associated with type 2 diabetes mellitus (HCC) were also pertinent to this visit.  HPI NYASIAH MAZEY presents for follow up on type 2 diabetes and other issues.   Cc:  Bilateral ear ache  for one week.  Has been using H202 on a cotton tip and notes that there has been no ear discharge.  Hearing has been muffled   Like she is in a tunnel,  Left ear pain radiates down her anterior neck , to the cervical chain LN's.  Symptoms were preceded by sinus congestion that persisted  for weeks to months   Has been using allerest and a humidifier,  Sinus drainage has been clear . But brownish in the morning.  No cough. Or fever. No facial pain    3 month follow up on diabetes.  Patient has no other complaints today.  Patient is following a low glycemic index diet about 50% of the time . Fasting sugars have been under  140 most of the time and post prandials have been under 200 except on rare occasions. Patient is exercising about 3 times per week and intentionally trying to lose weight .  Patient has had an eye exam in the last 12 months and checks feet regularly for signs of infection.  Patient does not walk barefoot outside,  And denies an numbness tingling or burning in feet. Patient is up to date on all recommended vaccinations  [facility]   Discussed refilling celebrex at lower dose and adding tylenol    Outpatient Medications Prior to Visit  Medication Sig Dispense Refill  . acetaminophen (TYLENOL) 325 MG tablet Take 2 tablets (650 mg  total) by mouth every 6 (six) hours as needed for mild pain (or Fever >/= 101).    Marland Kitchen amoxicillin (AMOXIL) 500 MG capsule TAKE 4 CAPSULES BY MOUTH 1 HOUR PRIOR TO DENTAL PROCEDURE 20 capsule 2  . Cholecalciferol (VITAMIN D3) 2000 units TABS Take 1 tablet by mouth daily.    . cycloSPORINE (RESTASIS) 0.05 % ophthalmic emulsion Place 1 drop into both eyes 2 (two) times daily.    . furosemide (LASIX) 20 MG tablet TAKE 1 TABLET BY MOUTH DAILY AS NEEDED. FOR FLUID RETENTION NOT MORE THAN EVERY OTHER DAY 45 tablet 1  . levothyroxine (SYNTHROID, LEVOTHROID) 125 MCG tablet Take 1 tablet (125 mcg total) by mouth daily before breakfast. 90 tablet 1  . LUMIGAN 0.01 % SOLN Place 1-2 drops into both eyes at bedtime.    Marland Kitchen nystatin cream (MYCOSTATIN) Apply 1 application topically 2 (two) times daily. 90 g 2  . pantoprazole (PROTONIX) 40 MG tablet TAKE 1 TABLET DAILY 90 tablet 4  . tiZANidine (ZANAFLEX) 4 MG capsule TAKE ONE CAPSULE BY MOUTH 3 TIMES A DAY 90 capsule 2  . amLODipine (NORVASC) 10 MG tablet Take 1 tablet (10 mg total) by mouth daily. 90 tablet 3  . celecoxib (CELEBREX) 200 MG capsule Take 1 capsule (200 mg total) by mouth 2 (two) times daily. 180 capsule 0  . dicyclomine (BENTYL) 20 MG tablet TAKE 1 TABLET FOUR TIMES A DAY BEFORE MEALS AND AT BEDTIME 270  tablet 5  . ezetimibe (ZETIA) 10 MG tablet Take 1 tablet (10 mg total) by mouth daily. 30 tablet 5  . rosuvastatin (CRESTOR) 5 MG tablet Take 1 tablet (5 mg total) by mouth daily. 90 tablet 0  . tolterodine (DETROL LA) 4 MG 24 hr capsule Take 1 capsule (4 mg total) by mouth 2 (two) times daily. 180 capsule 3   No facility-administered medications prior to visit.     Review of Systems;  Patient denies headache, fevers, malaise, unintentional weight loss, skin rash, eye pain, , sore throat, dysphagia,  hemoptysis , cough, dyspnea, wheezing, chest pain, palpitations, orthopnea, edema, abdominal pain, nausea, melena, diarrhea, constipation, flank pain,  dysuria, hematuria, urinary  Frequency, nocturia, numbness, tingling, seizures,  Focal weakness, Loss of consciousness,  Tremor, insomnia, depression, anxiety, and suicidal ideation.      Objective:  BP 140/90   Pulse 87   Temp 98.1 F (36.7 C) (Oral)   Wt 283 lb (128.4 kg)   SpO2 98%   BMI 47.09 kg/m   BP Readings from Last 3 Encounters:  06/10/18 140/90  02/05/18 (!) 160/86  12/23/17 126/78    Wt Readings from Last 3 Encounters:  06/10/18 283 lb (128.4 kg)  02/05/18 (!) 330 lb 1.9 oz (149.7 kg)  12/23/17 (!) 330 lb 6.4 oz (149.9 kg)    General appearance: alert, obese, cooperative and appears stated age Ears: bilateral injected  TM's, normal external ear canals both ears Throat: lips, mucosa, and tongue normal; teeth and gums normal Neck: no adenopathy, no carotid bruit, supple, symmetrical, trachea midline and thyroid not enlarged, symmetric, no tenderness/mass/nodules Back: symmetric, no curvature. ROM normal. No CVA tenderness. Lungs: clear to auscultation bilaterally Heart: regular rate and rhythm, S1, S2 normal, no murmur, click, rub or gallop Abdomen: soft, non-tender; bowel sounds normal; no masses,  no organomegaly Pulses: 2+ and symmetric Skin: Skin color, texture, turgor normal. No rashes or lesions Lymph nodes: Cervical, supraclavicular, and axillary nodes normal.  Lab Results  Component Value Date   HGBA1C 7.7 (H) 06/10/2018   HGBA1C 7.4 (H) 03/09/2018   HGBA1C 7.3 (H) 11/12/2017    Lab Results  Component Value Date   CREATININE 0.84 03/09/2018   CREATININE 0.89 02/05/2018   CREATININE 0.89 12/23/2017    Lab Results  Component Value Date   WBC 8.4 06/10/2018   HGB 14.9 06/10/2018   HCT 45.1 06/10/2018   PLT 306.0 06/10/2018   GLUCOSE 156 (H) 03/09/2018   CHOL 264 (H) 06/10/2018   TRIG 254.0 (H) 06/10/2018   HDL 49.30 06/10/2018   LDLDIRECT 177.0 06/10/2018   LDLCALC 210 (H) 12/25/2016   ALT 17 03/09/2018   AST 26 03/09/2018   NA 136  03/09/2018   K 4.5 03/09/2018   CL 100 03/09/2018   CREATININE 0.84 03/09/2018   BUN 20 03/09/2018   CO2 27 03/09/2018   TSH 4.41 06/10/2018   INR 1.37 09/04/2016   HGBA1C 7.7 (H) 06/10/2018   MICROALBUR 6.0 (H) 02/05/2018    Assessment & Plan:   Problem List Items Addressed This Visit    Otitis media of both ears    Caused by chronic sinus congestion for the last several weeks  Empiric treatment with antibiotic, steroid injection (does not tolerate oral taper due to personality changes ) and oral decongestant       Relevant Medications   amoxicillin-clavulanate (AUGMENTIN) 875-125 MG tablet   Hyperlipidemia    Using the Framingham risk calculator,  Her 10 year risk  of coronary artery disease is 53% she is tolerating low dose crestor and Zetia .   Lab Results  Component Value Date   CHOL 264 (H) 06/10/2018   HDL 49.30 06/10/2018   LDLCALC 210 (H) 12/25/2016   LDLDIRECT 177.0 06/10/2018   TRIG 254.0 (H) 06/10/2018   CHOLHDL 5 06/10/2018         Relevant Medications   amLODipine (NORVASC) 10 MG tablet   ezetimibe (ZETIA) 10 MG tablet   rosuvastatin (CRESTOR) 5 MG tablet   Essential hypertension, benign    Improved with Increasing amlodipine to 10 mg daily . No changes today       Relevant Medications   amLODipine (NORVASC) 10 MG tablet   ezetimibe (ZETIA) 10 MG tablet   rosuvastatin (CRESTOR) 5 MG tablet   Diabetic nephropathy associated with type 2 diabetes mellitus (HCC)     lisinopril was stopped in 2017 during acute kidney injury and Cr normalized after cessation    Lab Results  Component Value Date   HGBA1C 7.7 (H) 06/10/2018   Lab Results  Component Value Date   MICROALBUR 6.0 (H) 02/05/2018         Relevant Medications   rosuvastatin (CRESTOR) 5 MG tablet   Diabetes mellitus with complication (HCC) - Primary    Remains Less  well-controlled on diet alone.  Victoza was stopped during prior  hospitalization for pancreatitis and suspected to be  causative.   Patient is up-to-date on eye exams and foot exam is normal today. Will recommend use of Januvia and/or metformin   Lab Results  Component Value Date   HGBA1C 7.7 (H) 06/10/2018   Lab Results  Component Value Date   MICROALBUR 6.0 (H) 02/05/2018       Discussed current diet in detail . Low GI choices discussed.  no medication  changes return in one week for a1c         Relevant Medications   rosuvastatin (CRESTOR) 5 MG tablet   Other Relevant Orders   TSH (Completed)   Hemoglobin A1c (Completed)   Lipid panel (Completed)   Acquired hypothyroidism   Relevant Orders   TSH (Completed)    Other Visit Diagnoses    Acute serous otitis media, recurrence not specified, unspecified laterality       Relevant Medications   amoxicillin-clavulanate (AUGMENTIN) 875-125 MG tablet   methylPREDNISolone acetate (DEPO-MEDROL) injection 40 mg (Completed)   Other Relevant Orders   CBC with Differential/Platelet (Completed)     A total of 25 minutes of face to face time was spent with patient more than half of which was spent in counselling about the above mentioned conditions  and coordination of care  I have changed Kaina A. Bronk's celecoxib. I am also having her start on amoxicillin-clavulanate. Additionally, I am having her maintain her acetaminophen, cycloSPORINE, Lumigan, nystatin cream, Vitamin D3, amoxicillin, furosemide, levothyroxine, pantoprazole, tiZANidine, amLODipine, dicyclomine, ezetimibe, tolterodine, and rosuvastatin. We administered methylPREDNISolone acetate.  Meds ordered this encounter  Medications  . amoxicillin-clavulanate (AUGMENTIN) 875-125 MG tablet    Sig: Take 1 tablet by mouth 2 (two) times daily.    Dispense:  14 tablet    Refill:  0  . celecoxib (CELEBREX) 100 MG capsule    Sig: Take 1 capsule (100 mg total) by mouth 2 (two) times daily.    Dispense:  180 capsule    Refill:  1  . methylPREDNISolone acetate (DEPO-MEDROL) injection 40 mg   . amLODipine (NORVASC) 10 MG tablet  Sig: Take 1 tablet (10 mg total) by mouth daily.    Dispense:  90 tablet    Refill:  3  . dicyclomine (BENTYL) 20 MG tablet    Sig: TAKE 1 TABLET FOUR TIMES A DAY BEFORE MEALS AND AT BEDTIME    Dispense:  270 tablet    Refill:  5  . ezetimibe (ZETIA) 10 MG tablet    Sig: Take 1 tablet (10 mg total) by mouth daily.    Dispense:  90 tablet    Refill:  1  . tolterodine (DETROL LA) 4 MG 24 hr capsule    Sig: Take 1 capsule (4 mg total) by mouth 2 (two) times daily.    Dispense:  180 capsule    Refill:  3  . rosuvastatin (CRESTOR) 5 MG tablet    Sig: Take 1 tablet (5 mg total) by mouth daily.    Dispense:  90 tablet    Refill:  0    Medications Discontinued During This Encounter  Medication Reason  . celecoxib (CELEBREX) 200 MG capsule   . amLODipine (NORVASC) 10 MG tablet Reorder  . dicyclomine (BENTYL) 20 MG tablet Reorder  . ezetimibe (ZETIA) 10 MG tablet Reorder  . tolterodine (DETROL LA) 4 MG 24 hr capsule Reorder  . rosuvastatin (CRESTOR) 5 MG tablet Reorder    Follow-up: Return in about 3 months (around 09/10/2018).   Sherlene Shams, MD

## 2018-06-10 NOTE — Patient Instructions (Addendum)
You have  otitis media because of chronic sinus congestion  I recommend a round of antibiotics to make sure the otitis does not progress,  Along with a  Shot of prednisone to deal with the inflammation.  You can add Sudafed PE for the nasal and ear congestion.  The dose is 10 mg every 6 hours as needed , available OTC  .Do not take after 6 pm because it may keep you up.    Monitor your blood pressure to make sure the Sudafed PE does not raise it more than 5 or 6 pts above your baseline   Daily use of Probiotics for  3 weeks advised to reduce risk of C dificile colitis.  This includes yogurt,  Especially Activia one serving daily   I will refill your celebrex at the lower dose so you can continue taking it twice daily Please  up to 2000 mg of acetominophen (tylenol) every day safely  In divided doses (500 mg every 6 hours  Or 1000 mg every 12 hours.) to help the celebrex work better

## 2018-06-11 ENCOUNTER — Encounter: Payer: Self-pay | Admitting: Internal Medicine

## 2018-06-11 DIAGNOSIS — H6693 Otitis media, unspecified, bilateral: Secondary | ICD-10-CM | POA: Insufficient documentation

## 2018-06-11 MED ORDER — LEVOTHYROXINE SODIUM 125 MCG PO TABS: 125.0000 ug | ORAL_TABLET | Freq: Every day | ORAL | 1 refills | Status: DC

## 2018-06-11 MED ORDER — TOLTERODINE TARTRATE ER 4 MG PO CP24: 4.0000 mg | ORAL_CAPSULE | Freq: Two times a day (BID) | ORAL | 3 refills | Status: DC

## 2018-06-11 MED ORDER — ROSUVASTATIN CALCIUM 5 MG PO TABS: 5.0000 mg | ORAL_TABLET | Freq: Every day | ORAL | 0 refills | Status: DC

## 2018-06-11 MED ORDER — AMLODIPINE BESYLATE 10 MG PO TABS: 10.0000 mg | ORAL_TABLET | Freq: Every day | ORAL | 3 refills | Status: DC

## 2018-06-11 MED ORDER — DICYCLOMINE HCL 20 MG PO TABS: ORAL_TABLET | ORAL | 5 refills | Status: DC

## 2018-06-11 MED ORDER — EZETIMIBE 10 MG PO TABS: 10.0000 mg | ORAL_TABLET | Freq: Every day | ORAL | 1 refills | Status: DC

## 2018-06-11 NOTE — Assessment & Plan Note (Signed)
Improved with Increasing amlodipine to 10 mg daily . No changes today

## 2018-06-11 NOTE — Assessment & Plan Note (Signed)
Remains Less  well-controlled on diet alone.  Victoza was stopped during prior  hospitalization for pancreatitis and suspected to be causative.   Patient is up-to-date on eye exams and foot exam is normal today. Will recommend use of Januvia and/or metformin   Lab Results  Component Value Date   HGBA1C 7.7 (H) 06/10/2018   Lab Results  Component Value Date   MICROALBUR 6.0 (H) 02/05/2018       Discussed current diet in detail . Low GI choices discussed.  no medication  changes return in one week for a1c

## 2018-06-11 NOTE — Assessment & Plan Note (Signed)
Thyroid function is finally  WNL on current dose.  No current changes needed.   Lab Results  Component Value Date   TSH 4.41 06/10/2018

## 2018-06-11 NOTE — Assessment & Plan Note (Signed)
Using the Framingham risk calculator,  Her 10 year risk of coronary artery disease is 53% she is tolerating low dose crestor and Zetia .   Lab Results  Component Value Date   CHOL 264 (H) 06/10/2018   HDL 49.30 06/10/2018   LDLCALC 210 (H) 12/25/2016   LDLDIRECT 177.0 06/10/2018   TRIG 254.0 (H) 06/10/2018   CHOLHDL 5 06/10/2018

## 2018-06-11 NOTE — Assessment & Plan Note (Signed)
Caused by chronic sinus congestion for the last several weeks  Empiric treatment with antibiotic, steroid injection (does not tolerate oral taper due to personality changes ) and oral decongestant

## 2018-06-11 NOTE — Assessment & Plan Note (Signed)
lisinopril was stopped in 2017 during acute kidney injury and Cr normalized after cessation    Lab Results  Component Value Date   HGBA1C 7.7 (H) 06/10/2018   Lab Results  Component Value Date   MICROALBUR 6.0 (H) 02/05/2018

## 2018-06-15 ENCOUNTER — Other Ambulatory Visit: Payer: Self-pay | Admitting: Internal Medicine

## 2018-06-15 MED ORDER — METFORMIN HCL 500 MG PO TABS: 500.0000 mg | ORAL_TABLET | Freq: Every day | ORAL | 3 refills | Status: DC

## 2018-06-16 ENCOUNTER — Other Ambulatory Visit: Payer: Self-pay | Admitting: Internal Medicine

## 2018-08-29 ENCOUNTER — Other Ambulatory Visit: Payer: Self-pay | Admitting: Internal Medicine

## 2018-08-31 ENCOUNTER — Ambulatory Visit: Payer: Medicare Other | Admitting: Podiatry

## 2018-08-31 ENCOUNTER — Encounter: Payer: Self-pay | Admitting: Podiatry

## 2018-08-31 ENCOUNTER — Other Ambulatory Visit: Payer: Self-pay

## 2018-08-31 VITALS — Temp 98.5°F

## 2018-08-31 DIAGNOSIS — E1142 Type 2 diabetes mellitus with diabetic polyneuropathy: Secondary | ICD-10-CM | POA: Diagnosis not present

## 2018-08-31 DIAGNOSIS — M79676 Pain in unspecified toe(s): Secondary | ICD-10-CM | POA: Diagnosis not present

## 2018-08-31 DIAGNOSIS — B351 Tinea unguium: Secondary | ICD-10-CM

## 2018-08-31 NOTE — Progress Notes (Signed)
Complaint:  Visit Type: Patient returns to my office for continued preventative foot care services. Complaint: Patient states" my nails have grown long and thick and become painful to walk and wear shoes" Patient has been diagnosed with DM with neuropathy. The patient presents for preventative foot care services. No changes to ROS  Podiatric Exam: Vascular: dorsalis pedis and posterior tibial pulses are palpable bilateral. Capillary return is immediate. Temperature gradient is WNL. Skin turgor WNL  Sensorium: Normal Semmes Weinstein monofilament test. Normal tactile sensation bilaterally. Nail Exam: Pt has thick disfigured discolored nails with subungual debris noted bilateral entire nail hallux through fifth toenails Ulcer Exam: There is no evidence of ulcer or pre-ulcerative changes or infection. Orthopedic Exam: Muscle tone and strength are WNL. No limitations in general ROM. No crepitus or effusions noted. Contracted digits  B/L.   Severe PTTD left foot.  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  Hammer toes 2,3 right. Skin: No Porokeratosis. No infection or ulcers  Diagnosis:  Onychomycosis, , Pain in right toe, pain in left toes,  Diabetes with neuropathy.  Treatment & Plan Procedures and Treatment: Consent by patient was obtained for treatment procedures.   Debridement of mycotic and hypertrophic toenails, 1 through 5 bilateral and clearing of subungual debris. No ulceration, no infection noted.  Return Visit-Office Procedure: Patient instructed to return to the office for a follow up visit 3 months for continued evaluation and treatment.    Darlene Brozowski DPM 

## 2018-09-01 ENCOUNTER — Other Ambulatory Visit: Payer: Self-pay | Admitting: Internal Medicine

## 2018-09-11 ENCOUNTER — Ambulatory Visit (INDEPENDENT_AMBULATORY_CARE_PROVIDER_SITE_OTHER): Payer: Medicare Other | Admitting: Internal Medicine

## 2018-09-11 ENCOUNTER — Other Ambulatory Visit: Payer: Self-pay

## 2018-09-11 ENCOUNTER — Encounter: Payer: Self-pay | Admitting: Internal Medicine

## 2018-09-11 DIAGNOSIS — E118 Type 2 diabetes mellitus with unspecified complications: Secondary | ICD-10-CM | POA: Diagnosis not present

## 2018-09-11 DIAGNOSIS — M48061 Spinal stenosis, lumbar region without neurogenic claudication: Secondary | ICD-10-CM

## 2018-09-11 DIAGNOSIS — E1121 Type 2 diabetes mellitus with diabetic nephropathy: Secondary | ICD-10-CM

## 2018-09-11 DIAGNOSIS — Z96651 Presence of right artificial knee joint: Secondary | ICD-10-CM | POA: Diagnosis not present

## 2018-09-11 MED ORDER — EZETIMIBE 10 MG PO TABS: 10.0000 mg | ORAL_TABLET | Freq: Every day | ORAL | 1 refills | Status: DC

## 2018-09-11 MED ORDER — NYSTATIN 100000 UNIT/GM EX CREA: 1.0000 "application " | TOPICAL_CREAM | Freq: Two times a day (BID) | CUTANEOUS | 2 refills | Status: DC

## 2018-09-11 MED ORDER — DICYCLOMINE HCL 20 MG PO TABS: ORAL_TABLET | ORAL | 1 refills | Status: DC

## 2018-09-11 MED ORDER — TOLTERODINE TARTRATE ER 4 MG PO CP24: 4.0000 mg | ORAL_CAPSULE | Freq: Two times a day (BID) | ORAL | 1 refills | Status: DC

## 2018-09-11 NOTE — Progress Notes (Signed)
Telephone  Note  This visit type was conducted due to national recommendations for restrictions regarding the COVID-19 pandemic (e.g. social distancing).  This format is felt to be most appropriate for this patient at this time.  All issues noted in this document were discussed and addressed.  No physical exam was performed (except for noted visual exam findings with Video Visits).   I connected with@ on 09/11/18 at 10:30 AM EDT by  telephone and verified that I am speaking with the correct person using two identifiers. Location patient: home Location provider: work or home office Persons participating in the virtual visit: patient, provider  I discussed the limitations, risks, security and privacy concerns of performing an evaluation and management service by telephone and the availability of in person appointments. I also discussed with the patient that there may be a patient responsible charge related to this service. The patient expressed understanding and agreed to proceed.  Reason for visit: diabetes follow up  HPI:  The patient has no signs or symptoms of COVID 19 infection (fever, cough, sore throat  or shortness of breath beyond what is typical for patient).  Patient denies contact with other persons with the above mentioned symptoms or with anyone confirmed to have COVID 19   She has been "Staying busy at home "  Her daughter doing the grocery shopping and has  arranged for her to have curb service pickup  She has not progressed as she had hoped following her knee replacement, but states that she has been more diligent in doing exercises on her own that were  given to her by the Physical therapist whose home health services were stopped in December because she was no longer house confined.   Insurance stopped paying  Got depressed and blamed  Others for her failure to progress.  She states that she has been doing them daily ; motivated to get out of wheel chair ,   Walking with a walker  in the house .  DM: taking metformin after a temporary suspension; sees no difference in BS with or without it. The last 4 days of fasting sugars ranged from 126 to 171 (75% were > 150)   Chronic diarrhea  The diarrhea has been persistent, since her lap chole in June 2018 and has not improved with dicyclomine .  1-2 loose stools daily aggravated by vegetables and salad, did not resolve with suspension of metformin so she resumed taking it.   Treated for an ear infection in march.    ROS: See pertinent positives and negatives per HPI.  Past Medical History:  Diagnosis Date  . Anxiety   . Arthritis   . Choledocholithiasis   . Chronic back pain    stenosis and arthritis  . Complication of anesthesia    anxious when she wakes up  . GERD (gastroesophageal reflux disease)    takes Pantoprazole daily  . Glaucoma    dry angle  . History of bronchitis    30+ yrs ago  . History of MRSA infection 2004   several yrs ago  . Hyperlipidemia    takes Zetia and Crestor daily  . Hypertension    take Amlodipine daily  . Hypothyroidism    takes Synthroid daily  . Joint pain   . Joint swelling   . Neuropathy    takes Gabapentin daily  . Pancreatitis, gallstone   . Peripheral edema    takes Furosemide daily as needed  . Pneumonia    hx of-30 +  yrs ago  . Pre-diabetes    takes Victoza daily  . Primary localized osteoarthritis of right knee   . Subdural hematoma ([facility]) 8 yrs ago   hx of  . Urinary frequency   . Urinary urgency     Past Surgical History:  Procedure Laterality Date  . ABDOMINAL HYSTERECTOMY    . CHOLECYSTECTOMY N/A 09/09/2016   Procedure: LAPAROSCOPIC CHOLECYSTECTOMY;  Surgeon: Manus Ruddsuei, Matthew, MD;  [facility];  Service: General;  Laterality: N/A;  . COLONOSCOPY    . RADIOLOGY WITH ANESTHESIA N/A 08/22/2015   Procedure: MRI OF LUMBAR SPINE   (RADIOLOGY WITH ANESTHESIA);  Surgeon: Medication Radiologist, MD;  Location: [facility] OR;  Service: Radiology;  Laterality: N/A;  .  TOTAL HIP ARTHROPLASTY Right   . TOTAL HIP ARTHROPLASTY Left   . TOTAL KNEE ARTHROPLASTY Left   . TOTAL KNEE ARTHROPLASTY Right 01/01/2016   Procedure: TOTAL KNEE ARTHROPLASTY;  Surgeon: Salvatore Marvelobert Wainer, MD;  [facility];  Service: Orthopedics;  Laterality: Right;    Family History  Problem Relation Age of Onset  . Cancer Father   . Cancer Brother   . Diabetes Maternal Aunt     SOCIAL HX:  reports that she has never smoked. She has never used smokeless tobacco. She reports that she does not drink alcohol or use drugs.   Current Outpatient Medications:  .  acetaminophen (TYLENOL) 325 MG tablet, Take 2 tablets (650 mg total) by mouth every 6 (six) hours as needed for mild pain (or Fever >/= 101)., Disp: , Rfl:  .  amLODipine (NORVASC) 10 MG tablet, Take 1 tablet (10 mg total) by mouth daily., Disp: 90 tablet, Rfl: 3 .  amoxicillin (AMOXIL) 500 MG capsule, TAKE 4 CAPSULES BY MOUTH 1 HOUR PRIOR TO DENTAL PROCEDURE, Disp: 20 capsule, Rfl: 2 .  celecoxib (CELEBREX) 100 MG capsule, Take 1 capsule (100 mg total) by mouth 2 (two) times daily., Disp: 180 capsule, Rfl: 1 .  Cholecalciferol (VITAMIN D3) 2000 units TABS, Take 1 tablet by mouth daily., Disp: , Rfl:  .  cycloSPORINE (RESTASIS) 0.05 % ophthalmic emulsion, Place 1 drop into both eyes 2 (two) times daily., Disp: , Rfl:  .  dicyclomine (BENTYL) 20 MG tablet, TAKE 1 TABLET FOUR TIMES A DAY BEFORE MEALS AND AT BEDTIME, Disp: 360 tablet, Rfl: 1 .  ezetimibe (ZETIA) 10 MG tablet, Take 1 tablet (10 mg total) by mouth daily., Disp: 90 tablet, Rfl: 1 .  furosemide (LASIX) 20 MG tablet, TAKE 1 TABLET BY MOUTH DAILY AS NEEDED. FOR FLUID RETENTION NOT MORE THAN EVERY OTHER DAY, Disp: 45 tablet, Rfl: 1 .  levothyroxine (SYNTHROID, LEVOTHROID) 125 MCG tablet, Take 1 tablet (125 mcg total) by mouth daily before breakfast., Disp: 90 tablet, Rfl: 1 .  LUMIGAN 0.01 % SOLN, Place 1-2 drops into both eyes at bedtime., Disp: , Rfl:  .  metFORMIN  (GLUCOPHAGE) 500 MG tablet, Take 1 tablet (500 mg total) by mouth daily with breakfast., Disp: 90 tablet, Rfl: 3 .  nystatin cream (MYCOSTATIN), Apply 1 application topically 2 (two) times daily., Disp: 90 g, Rfl: 2 .  pantoprazole (PROTONIX) 40 MG tablet, Take 1 tablet (40 mg total) by mouth daily., Disp: 90 tablet, Rfl: 4 .  rosuvastatin (CRESTOR) 5 MG tablet, Take 1 tablet (5 mg total) by mouth daily., Disp: 90 tablet, Rfl: 0 .  tiZANidine (ZANAFLEX) 4 MG capsule, TAKE 1 CAPSULE BY MOUTH THREE TIMES A DAY, Disp: 270 capsule, Rfl: 0 .  tolterodine (DETROL LA)  4 MG 24 hr capsule, Take 1 capsule (4 mg total) by mouth 2 (two) times daily., Disp: 180 capsule, Rfl: 1  EXAM:  VITALS per patient if applicable:  GENERAL: alert, oriented, appears well and in no acute distress  HEENT: atraumatic, conjunttiva clear, no obvious abnormalities on inspection of external nose and ears  NECK: normal movements of the head and neck  LUNGS: on inspection no signs of respiratory distress, breathing rate appears normal, no obvious gross SOB, gasping or wheezing  CV: no obvious cyanosis  MS: moves all visible extremities without noticeable abnormality  PSYCH/NEURO: pleasant and cooperative, no obvious depression or anxiety, speech and thought processing grossly intact  ASSESSMENT AND PLAN:   Discussed the following assessment and plan:  Degenerative lumbar spinal stenosis With prior ESI done by [facility] .  She is now managing her pain without narcotics and using celebrex 100 mg bid and tylenol. Reminded to  Continue protonix daily   Diabetes mellitus with complication ([place]) Currently on metformin which aggravates her chronic diarrhea.   Victoza was stopped during prior  hospitalization for pancreatitis and suspected to be causative.   Patient is up-to-date on eye exams and foot exam is normal today. Will recommend use of Januvia since she wants to stop  metformin if a1c is > 7.0   Lab  Results  Component Value Date   HGBA1C 7.7 (H) 06/10/2018   Lab Results  Component Value Date   MICROALBUR 6.0 (H) 02/05/2018     Diabetic nephropathy associated with type 2 diabetes mellitus ([place])  lisinopril was stopped in 2017 during acute kidney injury and Cr normalized after cessation  . 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 She is now working independently and motivated to gain her independence from her wheelchair.     I discussed the assessment and treatment plan with the patient. The patient was provided an opportunity to ask questions and all were answered. The patient agreed with the plan and demonstrated an understanding of the instructions.   The patient was advised to call back or seek an in-person evaluation if the symptoms worsen or if the condition fails to improve as anticipated.  I provided 25 minutes of non-face-to-face time during this encounter.   Crecencio Mc, MD

## 2018-09-11 NOTE — Progress Notes (Signed)
Fasting BS for this morning-171  Refills: need to go to [facility]  Celebrex 100mg  Zanaflex

## 2018-09-11 NOTE — Patient Instructions (Addendum)
Please check your blood sugars 2 hours after dinner for the next 4-5 days and send me the readings  You do not have to continue the metformin   If the diarrhea does not resolve after several days of a BRAT diet ,  Let me know and I will prescribe a medication for it   Bland Diet A bland diet consists of foods that are often soft and do not have a lot of fat, fiber, or extra seasonings. Foods without fat, fiber, or seasoning are easier for the body to digest. They are also less likely to irritate your mouth, throat, stomach, and other parts of your digestive system. A bland diet is sometimes called a BRAT diet. What is my plan? Your health care provider or food and nutrition specialist (dietitian) may recommend specific changes to your diet to prevent symptoms or to treat your symptoms. These changes may include:  Eating small meals often.  Cooking food until it is soft enough to chew easily.  Chewing your food well.  Drinking fluids slowly.  Not eating foods that are very spicy, sour, or fatty.  Not eating citrus fruits, such as oranges and grapefruit. What do I need to know about this diet?  Eat a variety of foods from the bland diet food list.  Do not follow a bland diet longer than needed.  Ask your health care provider whether you should take vitamins or supplements. What foods can I eat? Grains  Hot cereals, such as cream of wheat. Rice. Bread, crackers, or tortillas made from refined white flour. Vegetables Canned or cooked vegetables. Mashed or boiled potatoes. Fruits  Bananas. Applesauce. Other types of cooked or canned fruit with the skin and seeds removed, such as canned peaches or pears. Meats and other proteins  Scrambled eggs. Creamy peanut butter or other nut butters. Lean, well-cooked meats, such as chicken or fish. Tofu. Soups or broths. Dairy Low-fat dairy products, such as milk, cottage cheese, or yogurt. Beverages  Water. Herbal tea. Apple juice. Fats  and oils Mild salad dressings. Canola or olive oil. Sweets and desserts Pudding. Custard. Fruit gelatin. Ice cream. The items listed above may not be a complete list of recommended foods and beverages. Contact a dietitian for more options. What foods are not recommended? Grains Whole grain breads and cereals. Vegetables Raw vegetables. Fruits Raw fruits, especially citrus, berries, or dried fruits. Dairy Whole fat dairy foods. Beverages Caffeinated drinks. Alcohol. Seasonings and condiments Strongly flavored seasonings or condiments. Hot sauce. Salsa. Other foods Spicy foods. Fried foods. Sour foods, such as pickled or fermented foods. Foods with high sugar content. Foods high in fiber. The items listed above may not be a complete list of foods and beverages to avoid. Contact a dietitian for more information. Summary  A bland diet consists of foods that are often soft and do not have a lot of fat, fiber, or extra seasonings.  Foods without fat, fiber, or seasoning are easier for the body to digest.  Check with your health care provider to see how long you should follow this diet plan. It is not meant to be followed for long periods. This information is not intended to replace advice given to you by your health care provider. Make sure you discuss any questions you have with your health care provider. Document Released: 07/03/2015 Document Revised: 04/09/2017 Document Reviewed: 04/09/2017 Elsevier Interactive Patient Education  2019 [employer].

## 2018-09-13 ENCOUNTER — Telehealth: Payer: Self-pay | Admitting: Internal Medicine

## 2018-09-13 MED ORDER — AMOXICILLIN 500 MG PO CAPS: ORAL_CAPSULE | ORAL | 2 refills | Status: DC

## 2018-09-13 MED ORDER — ROSUVASTATIN CALCIUM 5 MG PO TABS: 5.0000 mg | ORAL_TABLET | Freq: Every day | ORAL | 0 refills | Status: DC

## 2018-09-13 MED ORDER — METFORMIN HCL 500 MG PO TABS: 500.0000 mg | ORAL_TABLET | Freq: Every day | ORAL | 3 refills | Status: DC

## 2018-09-13 MED ORDER — PANTOPRAZOLE SODIUM 40 MG PO TBEC: 40.0000 mg | DELAYED_RELEASE_TABLET | Freq: Every day | ORAL | 4 refills | Status: DC

## 2018-09-13 NOTE — Assessment & Plan Note (Signed)
Recovery to independent ambulation after Oct 2017 surgery has been hindered by vertigo,  Obesity,  Subsequent hospitalization for pancreatitis. She is now working independently and motivated to gain her independence from her wheelchair.

## 2018-09-13 NOTE — Telephone Encounter (Signed)
Ms daniel needs non fasting labs to monitor her diabetes which was not under control in march .  I  Cannot prescribe an alternative to metformin OR ANYTHING FOR THE DIARRHEA without the labs

## 2018-09-13 NOTE — Assessment & Plan Note (Addendum)
With prior ESI done by [employer] .  She is now managing her pain without narcotics and using celebrex 100 mg bid and tylenol. Reminded to  Continue protonix daily

## 2018-09-13 NOTE — Assessment & Plan Note (Addendum)
Currently on metformin which aggravates her chronic diarrhea.   Victoza was stopped during prior  hospitalization for pancreatitis and suspected to be causative.   Patient is up-to-date on eye exams and foot exam is normal today. Will recommend use of Januvia since she wants to stop  metformin if a1c is > 7.0   Lab Results  Component Value Date   HGBA1C 7.7 (H) 06/10/2018   Lab Results  Component Value Date   MICROALBUR 6.0 (H) 02/05/2018

## 2018-09-13 NOTE — Assessment & Plan Note (Signed)
lisinopril was stopped in 2017 during acute kidney injury and Cr normalized after cessation  . 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

## 2018-09-13 NOTE — Telephone Encounter (Signed)
Needs nonfasting labs  See unrouted note

## 2018-09-15 ENCOUNTER — Telehealth: Payer: Self-pay | Admitting: Internal Medicine

## 2018-09-15 MED ORDER — ROSUVASTATIN CALCIUM 5 MG PO TABS: 5.0000 mg | ORAL_TABLET | Freq: Every day | ORAL | 1 refills | Status: DC

## 2018-09-15 MED ORDER — LANCETS 30G MISC: 2 refills | Status: DC

## 2018-09-15 NOTE — Telephone Encounter (Signed)
Pt called back. I scheduled pt for non fasting labs. I wanted to check with you and see if you needed to speak with her for anything else?  BP readings: 06/20 129/70  Pulse: 63 06/21 148/72  Pulse: 64 06/22 160/81  Pulse: 79 06/23 164/80  Pulse: 80  Thank you!

## 2018-09-15 NOTE — Telephone Encounter (Signed)
Pt called back. °

## 2018-09-15 NOTE — Telephone Encounter (Signed)
50% OF READINGS ARE HIGH  NEEDS TO CONTINUE CHECKING DAILY FOR ANOTHER WEEK AND SEND ME READINGS   CONFIRM THAT SHE IS TAKING AMLODIPINE 10 MG DAILY AT NIGHT

## 2018-09-15 NOTE — Telephone Encounter (Signed)
Medication Refill - Medication: lancetts 30 gauge  Has the patient contacted their pharmacy? Yes - pt only has one left (Agent: If no, request that the patient contact the pharmacy for the refill.) (Agent: If yes, when and what did the pharmacy advise?)  Preferred Pharmacy (with phone number or street name):  [facility], [place] [phone] (Phone) [phone] (Fax)   Agent: Please be advised that RX refills may take up to 3 business days. We ask that you follow-up with your pharmacy.

## 2018-09-15 NOTE — Telephone Encounter (Signed)
See other message. Pt has been scheduled.

## 2018-09-15 NOTE — Telephone Encounter (Signed)
LMTCB

## 2018-09-15 NOTE — Telephone Encounter (Signed)
Attempted to call pt. No answer no voicemail. [facility] may speak with pt.  

## 2018-09-16 ENCOUNTER — Other Ambulatory Visit: Payer: Self-pay | Admitting: Internal Medicine

## 2018-09-21 NOTE — Telephone Encounter (Signed)
LMTCB

## 2018-09-21 NOTE — Telephone Encounter (Signed)
Pt called back. °

## 2018-09-22 ENCOUNTER — Other Ambulatory Visit: Payer: Self-pay

## 2018-09-22 ENCOUNTER — Other Ambulatory Visit (INDEPENDENT_AMBULATORY_CARE_PROVIDER_SITE_OTHER): Payer: Medicare Other

## 2018-09-22 DIAGNOSIS — E118 Type 2 diabetes mellitus with unspecified complications: Secondary | ICD-10-CM

## 2018-09-22 LAB — COMPREHENSIVE METABOLIC PANEL
ALT: 20 U/L (ref 0–35)
AST: 30 U/L (ref 0–37)
Albumin: 4.3 g/dL (ref 3.5–5.2)
Alkaline Phosphatase: 67 U/L (ref 39–117)
BUN: 13 mg/dL (ref 6–23)
CO2: 24 mEq/L (ref 19–32)
Calcium: 9.4 mg/dL (ref 8.4–10.5)
Chloride: 103 mEq/L (ref 96–112)
Creatinine, Ser: 0.85 mg/dL (ref 0.40–1.20)
GFR: 64.73 mL/min (ref >60.00)
Glucose, Bld: 133 mg/dL — ABNORMAL HIGH (ref 70–99)
Potassium: 4.3 mEq/L (ref 3.5–5.1)
Sodium: 137 mEq/L (ref 135–145)
Total Bilirubin: 0.5 mg/dL (ref 0.2–1.2)
Total Protein: 7.4 g/dL (ref 6.0–8.3)

## 2018-09-22 LAB — HEMOGLOBIN A1C: Hgb A1c MFr Bld: 7.3 % — ABNORMAL HIGH (ref 4.6–6.5)

## 2018-09-24 ENCOUNTER — Telehealth: Payer: Self-pay | Admitting: Internal Medicine

## 2018-09-24 NOTE — Telephone Encounter (Signed)
LMTCB

## 2018-09-24 NOTE — Telephone Encounter (Signed)
Yes she can start adding things back to diet as I wrote out fo her in prior message or AVS.    Has the diarrhea resolved?  Need to know ths  Continue current bp meds

## 2018-09-24 NOTE — Telephone Encounter (Signed)
Patient is calling back to to speak to [place]. Thank you

## 2018-09-24 NOTE — Telephone Encounter (Signed)
Spoke with pt and she stated that she is taking the amlodipine 10mg  every night.    09/16/2018 126/68 65                                                09/17/2018 144/73 66 09/18/2018 144/73 72 09/19/2018 121/63 66 09/20/2018 144/71 74 09/21/2018 149/73 75 09/24/2018   121/64 58   50mins later 119/66 59  Blood sugar readings (all readings are fasting)  09/16/2018  174 09/17/2018  160 09/18/2018  153 09/19/2018  111 09/20/2018  134 09/21/2018  124 09/24/2018    159 ate a big bowl of watermelon at 11pm the night before skipped supper.   Pt stated that she has been sticking to the [employer] but is getting tried of eating the same things all the time. The pt stated that she feels like she is starting to get weak from eating very little. She is wanting to know when she can start eating different things.

## 2018-09-28 NOTE — Telephone Encounter (Signed)
Her AVS from last summary listed all the foods to try and the  ones to avoid,  RAW VEGETABLES WERE NOT ADVISED.  Does she need another copy? I have printed it

## 2018-09-28 NOTE — Telephone Encounter (Signed)
Spoke with pt and she stated that she is not having any diarrhea at this time but that she has not added any vegetables to her diet yet because she did try to eat a cucumber and it caused her to have diarrhea.

## 2018-09-28 NOTE — Telephone Encounter (Signed)
See previous message

## 2018-09-29 NOTE — Telephone Encounter (Signed)
Pt stated that she has the AVS and does not need another one.

## 2018-12-03 ENCOUNTER — Ambulatory Visit: Payer: Medicare Other | Admitting: Podiatry

## 2018-12-03 ENCOUNTER — Other Ambulatory Visit: Payer: Self-pay

## 2018-12-03 ENCOUNTER — Encounter: Payer: Self-pay | Admitting: Podiatry

## 2018-12-03 DIAGNOSIS — B351 Tinea unguium: Secondary | ICD-10-CM

## 2018-12-03 DIAGNOSIS — E1142 Type 2 diabetes mellitus with diabetic polyneuropathy: Secondary | ICD-10-CM | POA: Diagnosis not present

## 2018-12-03 DIAGNOSIS — M79676 Pain in unspecified toe(s): Secondary | ICD-10-CM

## 2018-12-03 NOTE — Progress Notes (Signed)
Complaint:  Visit Type: Patient returns to my office for continued preventative foot care services. Complaint: Patient states" my nails have grown long and thick and become painful to walk and wear shoes" Patient has been diagnosed with DM with neuropathy. The patient presents for preventative foot care services. No changes to ROS  Podiatric Exam: Vascular: dorsalis pedis and posterior tibial pulses are palpable bilateral. Capillary return is immediate. Temperature gradient is WNL. Skin turgor WNL  Sensorium: Normal Semmes Weinstein monofilament test. Normal tactile sensation bilaterally. Nail Exam: Pt has thick disfigured discolored nails with subungual debris noted bilateral entire nail hallux through fifth toenails Ulcer Exam: There is no evidence of ulcer or pre-ulcerative changes or infection. Orthopedic Exam: Muscle tone and strength are WNL. No limitations in general ROM. No crepitus or effusions noted. Contracted digits  B/L.   Severe PTTD left foot.  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  Hammer toes 2,3 right. Skin: No Porokeratosis. No infection or ulcers  Diagnosis:  Onychomycosis, , Pain in right toe, pain in left toes,  Diabetes with neuropathy.  Treatment & Plan Procedures and Treatment: Consent by patient was obtained for treatment procedures.   Debridement of mycotic and hypertrophic toenails, 1 through 5 bilateral and clearing of subungual debris. No ulceration, no infection noted.  Return Visit-Office Procedure: Patient instructed to return to the office for a follow up visit 3 months for continued evaluation and treatment.    Gardiner Barefoot DPM

## 2018-12-25 ENCOUNTER — Ambulatory Visit (INDEPENDENT_AMBULATORY_CARE_PROVIDER_SITE_OTHER): Payer: Medicare Other

## 2018-12-25 ENCOUNTER — Other Ambulatory Visit: Payer: Self-pay

## 2018-12-25 DIAGNOSIS — Z0000 Encounter for general adult medical examination without abnormal findings: Secondary | ICD-10-CM

## 2018-12-25 NOTE — Patient Instructions (Signed)
  Ms. Pasquarella , Thank you for taking time to come for your Medicare Wellness Visit. I appreciate your ongoing commitment to your health goals. Please review the following plan we discussed and let me know if I can assist you in the future.   These are the goals we discussed: Goals      Patient Stated   . Low Carb/Low Cholesterol Diet (pt-stated)       This is a list of the screening recommended for you and due dates:  Health Maintenance  Topic Date Due  . Tetanus Vaccine  01/10/1960  . DEXA scan (bone density measurement)  01/09/2006  . Urine Protein Check  02/06/2019  . Flu Shot  06/23/2019*  . Complete foot exam   02/06/2019  . Hemoglobin A1C  03/24/2019  . Eye exam for diabetics  10/07/2019  . Pneumonia vaccines  Completed  *Topic was postponed. The date shown is not the original due date.

## 2018-12-25 NOTE — Progress Notes (Signed)
Subjective:   Bonnie Hinton is a 78 y.o. female who presents for Medicare Annual (Subsequent) preventive examination.  Review of Systems:  No ROS.  Medicare Wellness Virtual Visit.  Visual/audio telehealth visit, UTA vital signs.   See social history for additional risk factors.   Cardiac Risk Factors include: advanced age (>5855men, 84>65 women);hypertension;diabetes mellitus     Objective:     Vitals: There were no vitals taken for this visit.  There is no height or weight on file to calculate BMI.  Advanced Directives 12/25/2018 12/23/2017 09/04/2016 04/05/2016 01/05/2016 12/21/2015  Does Patient Have a Medical Advance Directive? Yes Yes Yes No;Yes No Yes  Type of Estate agentAdvance Directive Healthcare Power of [place];Living will Living will;Healthcare Power of [employer] Corporationttorney Healthcare Power of [place]orney;Living will - - Living will  Does patient want to make changes to medical advance directive? No - Patient declined No - Patient declined No - Patient declined - - -  Copy of Healthcare Power of Attorney in Chart? No - copy requested No - copy requested No - copy requested - - No - copy requested  Would patient like information on creating a medical advance directive? - - - - No - patient declined information -    Tobacco Social History   Tobacco Use  Smoking Status Never Smoker  Smokeless Tobacco Never Used     Counseling given: Not Answered   Clinical Intake:  Pre-visit preparation completed: Yes        Diabetes: Yes(Followed by pcp)  How often do you need to have someone help you when you read instructions, pamphlets, or other written materials from your doctor or pharmacy?: 1 - Never  Interpreter Needed?: No     Past Medical History:  Diagnosis Date  . Anxiety   . Arthritis   . Choledocholithiasis   . Chronic back pain    stenosis and arthritis  . Complication of anesthesia    anxious when she wakes up  . GERD (gastroesophageal reflux disease)    takes  Pantoprazole daily  . Glaucoma    dry angle  . History of bronchitis    30+ yrs ago  . History of MRSA infection 2004   several yrs ago  . Hyperlipidemia    takes Zetia and Crestor daily  . Hypertension    take Amlodipine daily  . Hypothyroidism    takes Synthroid daily  . Joint pain   . Joint swelling   . Neuropathy    takes Gabapentin daily  . Pancreatitis, gallstone   . Peripheral edema    takes Furosemide daily as needed  . Pneumonia    hx of-30 + yrs ago  . Pre-diabetes    takes Victoza daily  . Primary localized osteoarthritis of right knee   . Subdural hematoma (HCC) 8 yrs ago   hx of  . Urinary frequency   . Urinary urgency    Past Surgical History:  Procedure Laterality Date  . ABDOMINAL HYSTERECTOMY    . CHOLECYSTECTOMY N/A 09/09/2016   Procedure: LAPAROSCOPIC CHOLECYSTECTOMY;  Surgeon: Manus Ruddsuei, Matthew, MD;  [facility];  Service: General;  Laterality: N/A;  . COLONOSCOPY    . RADIOLOGY WITH ANESTHESIA N/A 08/22/2015   Procedure: MRI OF LUMBAR SPINE   (RADIOLOGY WITH ANESTHESIA);  Surgeon: Medication Radiologist, MD;  Location: [facility] OR;  Service: Radiology;  Laterality: N/A;  . TOTAL HIP ARTHROPLASTY Right   . TOTAL HIP ARTHROPLASTY Left   . TOTAL KNEE ARTHROPLASTY Left   . TOTAL  KNEE ARTHROPLASTY Right 01/01/2016   Procedure: TOTAL KNEE ARTHROPLASTY;  Surgeon: Elsie Saas, MD;  Location: [facility];  Service: Orthopedics;  Laterality: Right;   Family History  Problem Relation Age of Onset  . Cancer Father   . Cancer Brother   . Diabetes Maternal Aunt    Social History   Socioeconomic History  . Marital status: Widowed    Spouse name: Not on file  . Number of children: Not on file  . Years of education: Not on file  . Highest education level: Not on file  Occupational History  . Not on file  Social Needs  . Financial resource strain: Not hard at all  . Food insecurity    Worry: Never true    Inability: Never true  . Transportation needs     Medical: No    Non-medical: No  Tobacco Use  . Smoking status: Never Smoker  . Smokeless tobacco: Never Used  Substance and Sexual Activity  . Alcohol use: No    Alcohol/week: 0.0 standard drinks  . Drug use: No  . Sexual activity: Not on file  Lifestyle  . Physical activity    Days per week: 7 days    Minutes per session: Not on file  . Stress: Not at all  Relationships  . Social Herbalist on phone: Not on file    Gets together: Not on file    Attends religious service: Not on file    Active member of club or organization: Not on file    Attends meetings of clubs or organizations: Not on file    Relationship status: Not on file  Other Topics Concern  . Not on file  Social History Narrative  . Not on file    Outpatient Encounter Medications as of 12/25/2018  Medication Sig  . acetaminophen (TYLENOL) 325 MG tablet Take 2 tablets (650 mg total) by mouth every 6 (six) hours as needed for mild pain (or Fever >/= 101).  Marland Kitchen amLODipine (NORVASC) 10 MG tablet Take 1 tablet (10 mg total) by mouth daily.  Marland Kitchen amoxicillin (AMOXIL) 500 MG capsule TAKE 4 CAPSULES BY MOUTH 1 HOUR PRIOR TO DENTAL PROCEDURE  . Cholecalciferol (VITAMIN D3) 2000 units TABS Take 1 tablet by mouth daily.  . cycloSPORINE (RESTASIS) 0.05 % ophthalmic emulsion Place 1 drop into both eyes 2 (two) times daily.  Marland Kitchen dicyclomine (BENTYL) 20 MG tablet TAKE 1 TABLET FOUR TIMES A DAY BEFORE MEALS AND AT BEDTIME  . ezetimibe (ZETIA) 10 MG tablet Take 1 tablet (10 mg total) by mouth daily.  . furosemide (LASIX) 20 MG tablet TAKE 1 TABLET BY MOUTH DAILY AS NEEDED. FOR FLUID RETENTION NOT MORE THAN EVERY OTHER DAY  . Lancets 30G MISC Use to check blood sugars once daily.  Marland Kitchen levothyroxine (SYNTHROID, LEVOTHROID) 125 MCG tablet Take 1 tablet (125 mcg total) by mouth daily before breakfast.  . LUMIGAN 0.01 % SOLN Place 1-2 drops into both eyes at bedtime.  Marland Kitchen nystatin cream (MYCOSTATIN) Apply 1 application topically 2 (two)  times daily.  . pantoprazole (PROTONIX) 40 MG tablet Take 1 tablet (40 mg total) by mouth daily.  Marland Kitchen tiZANidine (ZANAFLEX) 4 MG capsule TAKE 1 CAPSULE THREE TIMES A DAY AS NEEDED FOR MUSCLE SPASMS  . tolterodine (DETROL LA) 4 MG 24 hr capsule Take 1 capsule (4 mg total) by mouth 2 (two) times daily.  . celecoxib (CELEBREX) 100 MG capsule Take 1 capsule (100 mg total) by mouth 2 (two)  times daily. (Patient not taking: Reported on 12/25/2018)  . metFORMIN (GLUCOPHAGE) 500 MG tablet Take 1 tablet (500 mg total) by mouth daily with breakfast. (Patient not taking: Reported on 12/25/2018)  . [DISCONTINUED] rosuvastatin (CRESTOR) 5 MG tablet Take 1 tablet (5 mg total) by mouth daily. (Patient not taking: Reported on 12/25/2018)   No facility-administered encounter medications on file as of 12/25/2018.     Activities of Daily Living In your present state of health, do you have any difficulty performing the following activities: 12/25/2018  Hearing? N  Vision? N  Difficulty concentrating or making decisions? N  Walking or climbing stairs? Y  Comment Unsteady gait. Wheelchair and walker in use as needed.  Dressing or bathing? N  Doing errands, shopping? N  Preparing Food and eating ? N  Using the Toilet? N  In the past six months, have you accidently leaked urine? N  Do you have problems with loss of bowel control? N  Managing your Medications? N  Managing your Finances? N  Housekeeping or managing your Housekeeping? N  Some recent data might be hidden    Patient Care Team: Sherlene Shams, MD as PCP - General (Internal Medicine)    Assessment:   This is a routine wellness examination for [place].  I connected with patient 12/25/18 at 10:00 AM EDT by an audio enabled telemedicine application and verified that I am speaking with the correct person using two identifiers. Patient stated full name and DOB. Patient gave permission to continue with virtual visit. Patient's location was at home and  Nurse's location was at [place] office.   Health Maintenance Due: -Influenza vaccine 2020- discussed; to be completed in season with doctor or local pharmacy.   -Tdap- discussed; to be completed with doctor in visit or local pharmacy.    Dexa Scan- discussed. Patient prefers to wait until visit with pcp. Foot Exam- visits every 3 months; followed by podiatry.  Hgb A1c- 09/22/18 (7.3)  Update all pending maintenance due as appropriate.   See completed HM at the end of note.   Eye: Visual acuity not assessed. Virtual visit. Wears corrective lenses. Followed by their ophthalmologist every 12 months.  Retinopathy- none reported  Dental: Visits every 6 months.    Hearing: Demonstrates normal hearing during visit.  Safety:  Patient feels safe at home- yes Patient does have smoke detectors at home- yes Patient does wear sunscreen or protective clothing when in direct sunlight - yes Patient does wear seat belt when in a moving vehicle - yes Patient drives- yes Adequate lighting in walkways free from debris- yes Grab bars and handrails used as appropriate- yes Ambulates with wheelchair, walker as an assistive device Cell phone or lifeline/life alert/medic alert on person when ambulating outside of the home- yes  Social: Alcohol intake - no   Smoking history- never   Smokers in home? none Illicit drug use? none  Depression: PHQ 2 &9 complete. See screening below. Denies irritability, anhedonia, sadness/tearfullness.  Stable.   Falls: See screening below.    Medication: Taking as directed and without issues. She has not been taking the metformin or celebrex. She plans to start taking the metformin today and celebrex when it gets colder weather.  Fasting BS today 145.   Covid-19: Precautions and sickness symptoms discussed. Wears mask, social distancing, hand hygiene as appropriate.   Activities of Daily Living Patient denies needing assistance with: household chores, feeding  themselves, getting from bed to chair, getting to the toilet, bathing/showering, dressing,  managing money, or preparing meals.   Memory: Patient is alert. Patient denies difficulty focusing or concentrating. Correctly identified the president of the [place], season and recall.  BMI- discussed the importance of a healthy diet, water intake and the benefits of aerobic exercise.  Educational material provided.  Physical activity- leg lifts in bed, lower body exercises, standing exercises.  Diet: Diabetic Water: good intake Caffeine: no  Other Providers Patient Care Team: Sherlene Shams, MD as PCP - General (Internal Medicine)  Exercise Activities and Dietary recommendations Current Exercise Habits: Home exercise routine, Type of exercise: walking(standing/lying exercises), Time (Minutes): 20, Frequency (Times/Week): 7, Weekly Exercise (Minutes/Week): 140, Intensity: Mild  Goals      Patient Stated   . Low Carb/Low Cholesterol Diet (pt-stated)       Fall Risk Fall Risk  12/25/2018 12/23/2017 06/25/2017 05/24/2016 04/25/2015  Falls in the past year? 0 No No Yes No  Number falls in past yr: - - - 2 or more -  Injury with Fall? - - - No -   Timed Get Up and Go performed: no, virtual visit  Depression Screen PHQ 2/9 Scores 12/25/2018 12/23/2017 06/25/2017 05/24/2016  PHQ - 2 Score 0 0 0 0  PHQ- 9 Score - - 1 -     Cognitive Function MMSE - Mini Mental State Exam 12/23/2017  Orientation to time 5  Orientation to Place 5  Registration 3  Attention/ Calculation 5  Recall 2  Language- name 2 objects 2  Language- repeat 1  Language- follow 3 step command 3  Language- read & follow direction 1  Write a sentence 1  Copy design 1  Total score 29     6CIT Screen 12/25/2018  What Year? 0 points  What month? 0 points  What time? 0 points  Count back from 20 0 points  Months in reverse 0 points  Repeat phrase 0 points  Total Score 0    Immunization History  Administered Date(s)  Administered  . Influenza, High Dose Seasonal PF 01/23/2015, 12/04/2015, 12/25/2016, 12/23/2017  . Pneumococcal Conjugate-13 04/25/2015  . Pneumococcal Polysaccharide-23 02/05/2018   Screening Tests Health Maintenance  Topic Date Due  . TETANUS/TDAP  01/10/1960  . DEXA SCAN  01/09/2006  . URINE MICROALBUMIN  02/06/2019  . INFLUENZA VACCINE  06/23/2019 (Originally 10/24/2018)  . FOOT EXAM  02/06/2019  . HEMOGLOBIN A1C  03/24/2019  . OPHTHALMOLOGY EXAM  10/07/2019  . PNA vac Low Risk Adult  Completed      Plan:   Keep all routine maintenance appointments.   Next scheduled appointment 01/01/19 influenza vaccine  Medicare Attestation I have personally reviewed: The patient's medical and social history Their use of alcohol, tobacco or illicit drugs Their current medications and supplements The patient's functional ability including ADLs,fall risks, home safety risks, cognitive, and hearing and visual impairment Diet and physical activities Evidence for depression   In addition, I have reviewed and discussed with patient certain preventive protocols, quality metrics, and best practice recommendations. A written personalized care plan for preventive services as well as general preventive health recommendations were provided to patient via mail.     Ashok Pall, LPN  16/03/958

## 2019-01-01 ENCOUNTER — Other Ambulatory Visit: Payer: Self-pay

## 2019-01-01 ENCOUNTER — Ambulatory Visit (INDEPENDENT_AMBULATORY_CARE_PROVIDER_SITE_OTHER): Payer: Medicare Other

## 2019-01-01 DIAGNOSIS — Z23 Encounter for immunization: Secondary | ICD-10-CM | POA: Diagnosis not present

## 2019-02-13 ENCOUNTER — Other Ambulatory Visit: Payer: Self-pay | Admitting: Internal Medicine

## 2019-03-04 ENCOUNTER — Other Ambulatory Visit: Payer: Self-pay

## 2019-03-04 ENCOUNTER — Ambulatory Visit: Payer: Medicare Other | Admitting: Podiatry

## 2019-03-04 ENCOUNTER — Encounter: Payer: Self-pay | Admitting: Podiatry

## 2019-03-04 DIAGNOSIS — B351 Tinea unguium: Secondary | ICD-10-CM | POA: Diagnosis not present

## 2019-03-04 DIAGNOSIS — M79676 Pain in unspecified toe(s): Secondary | ICD-10-CM | POA: Diagnosis not present

## 2019-03-04 DIAGNOSIS — E1142 Type 2 diabetes mellitus with diabetic polyneuropathy: Secondary | ICD-10-CM | POA: Diagnosis not present

## 2019-03-04 NOTE — Progress Notes (Signed)
Complaint:  Visit Type: Patient returns to my office for continued preventative foot care services. Complaint: Patient states" my nails have grown long and thick and become painful to walk and wear shoes" Patient has been diagnosed with DM with neuropathy. The patient presents for preventative foot care services. No changes to ROS  Podiatric Exam: Vascular: dorsalis pedis and posterior tibial pulses are palpable bilateral. Capillary return is immediate. Temperature gradient is WNL. Skin turgor WNL  Sensorium: Normal Semmes Weinstein monofilament test. Normal tactile sensation bilaterally. Nail Exam: Pt has thick disfigured discolored nails with subungual debris noted bilateral entire nail hallux through fifth toenails Ulcer Exam: There is no evidence of ulcer or pre-ulcerative changes or infection. Orthopedic Exam: Muscle tone and strength are WNL. No limitations in general ROM. No crepitus or effusions noted. Contracted digits  B/L.   Severe PTTD left foot.  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  Hammer toes 2,3 right. Skin: No Porokeratosis. No infection or ulcers  Diagnosis:  Onychomycosis, , Pain in right toe, pain in left toes,  Diabetes with neuropathy.  Treatment & Plan Procedures and Treatment: Consent by patient was obtained for treatment procedures.   Debridement of mycotic and hypertrophic toenails, 1 through 5 bilateral and clearing of subungual debris. No ulceration, no infection noted.  Return Visit-Office Procedure: Patient instructed to return to the office for a follow up visit 3 months for continued evaluation and treatment.    Gardiner Barefoot DPM

## 2019-04-05 ENCOUNTER — Telehealth: Payer: Self-pay | Admitting: Internal Medicine

## 2019-04-05 NOTE — Telephone Encounter (Signed)
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 Pt stated that she has not.

## 2019-04-05 NOTE — Telephone Encounter (Signed)
Pt wants to know if you think it is safe for her to get the Covid vaccine.

## 2019-04-05 NOTE — Telephone Encounter (Signed)
Pt called about having some questions regarding Covid vaccine. Please advise and Thank you!  Call pt @ [phone].

## 2019-04-05 NOTE — Telephone Encounter (Signed)
"  Dr. 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

## 2019-05-01 ENCOUNTER — Other Ambulatory Visit: Payer: Self-pay | Admitting: Internal Medicine

## 2019-05-04 ENCOUNTER — Telehealth: Payer: Self-pay | Admitting: Internal Medicine

## 2019-05-04 NOTE — Telephone Encounter (Signed)
[facility] called needing to know pt allergies   [phone] ref# [id number]

## 2019-05-05 NOTE — Telephone Encounter (Signed)
Spoke with [facility] and they were wanting to know what the other was on the pt's allergy list. I let them know that it was adhesive tape and also verified all other allergies.

## 2019-05-10 ENCOUNTER — Other Ambulatory Visit: Payer: Self-pay | Admitting: Internal Medicine

## 2019-05-22 ENCOUNTER — Other Ambulatory Visit: Payer: Self-pay | Admitting: Internal Medicine

## 2019-06-01 ENCOUNTER — Telehealth: Payer: Self-pay | Admitting: Internal Medicine

## 2019-06-01 NOTE — Telephone Encounter (Signed)
Pt states she needs another letter stating that she can't go to her mailbox and she has no one to pick up mail for her sent to the [employer]. She needs it within a week. She is asking for a call back.

## 2019-06-01 NOTE — Telephone Encounter (Signed)
Letter written  Please print and notify patient   She has a deadline

## 2019-06-02 ENCOUNTER — Other Ambulatory Visit: Payer: Self-pay | Admitting: Internal Medicine

## 2019-06-02 ENCOUNTER — Telehealth: Payer: Self-pay | Admitting: Internal Medicine

## 2019-06-02 NOTE — Telephone Encounter (Signed)
Letter has been mailed out to pt today. Pt is aware.

## 2019-06-02 NOTE — Telephone Encounter (Signed)
Patient called about letter. Please mail letter to patient ASAP, please. Thanks

## 2019-06-03 ENCOUNTER — Other Ambulatory Visit: Payer: Self-pay

## 2019-06-03 ENCOUNTER — Encounter: Payer: Self-pay | Admitting: Podiatry

## 2019-06-03 ENCOUNTER — Ambulatory Visit: Payer: Medicare Other | Admitting: Podiatry

## 2019-06-03 DIAGNOSIS — M79676 Pain in unspecified toe(s): Secondary | ICD-10-CM | POA: Diagnosis not present

## 2019-06-03 DIAGNOSIS — E1142 Type 2 diabetes mellitus with diabetic polyneuropathy: Secondary | ICD-10-CM

## 2019-06-03 DIAGNOSIS — B351 Tinea unguium: Secondary | ICD-10-CM

## 2019-06-03 NOTE — Progress Notes (Signed)
This patient returns to my office for at risk foot care.  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  This patient is unable to cut nails herself since the patient cannot reach her  nails.These nails are painful walking and wearing shoes.  This patient presents for at risk foot care today.  General Appearance  Alert, conversant and in no acute stress.  Vascular  Dorsalis pedis and posterior tibial  pulses are palpable  bilaterally.  Capillary return is within normal limits  bilaterally. Temperature is within normal limits  bilaterally.  Neurologic  Senn-Weinstein monofilament wire test within normal limits  bilaterally. Muscle power within normal limits bilaterally.  Nails Thick disfigured discolored nails with subungual debris  from hallux to fifth toes bilaterally. No evidence of bacterial infection or drainage bilaterally.  Orthopedic  No limitations of motion  feet .  No crepitus or effusions noted.  Contracted digits  B/L.  Severe PTTD left foot.   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  Hammer toes 2,3 right foot.  Skin  normotropic skin with no porokeratosis noted bilaterally.  No signs of infections or ulcers noted.     Onychomycosis  Pain in right toes  Pain in left toes  Consent was obtained for treatment procedures.   Mechanical debridement of nails 1-5  bilaterally performed with a nail nipper.  Filed with dremel without incident. No infection or ulcer.     Return office visit 3 months         Told patient to return for periodic foot care and evaluation due to potential at risk complications.   Helane Gunther DPM

## 2019-06-29 ENCOUNTER — Other Ambulatory Visit: Payer: Self-pay | Admitting: Internal Medicine

## 2019-07-07 ENCOUNTER — Other Ambulatory Visit: Payer: Self-pay | Admitting: Internal Medicine

## 2019-07-16 ENCOUNTER — Telehealth: Payer: Self-pay | Admitting: Internal Medicine

## 2019-07-16 NOTE — Telephone Encounter (Signed)
Bonnie Hinton with [employer] wants pt's ov note and bp reading from 12/2018. Her fax number is [phone]

## 2019-07-19 NOTE — Telephone Encounter (Signed)
LMTCB with Carollee Herter at [employer].

## 2019-08-10 ENCOUNTER — Other Ambulatory Visit: Payer: Self-pay | Admitting: Internal Medicine

## 2019-09-06 ENCOUNTER — Ambulatory Visit: Payer: Medicare Other | Admitting: Podiatry

## 2019-09-09 ENCOUNTER — Other Ambulatory Visit: Payer: Self-pay

## 2019-09-09 ENCOUNTER — Encounter: Payer: Self-pay | Admitting: Podiatry

## 2019-09-09 ENCOUNTER — Ambulatory Visit: Payer: Medicare Other | Admitting: Podiatry

## 2019-09-09 DIAGNOSIS — M79676 Pain in unspecified toe(s): Secondary | ICD-10-CM

## 2019-09-09 DIAGNOSIS — B351 Tinea unguium: Secondary | ICD-10-CM

## 2019-09-09 DIAGNOSIS — E1142 Type 2 diabetes mellitus with diabetic polyneuropathy: Secondary | ICD-10-CM

## 2019-09-09 NOTE — Progress Notes (Signed)
This patient returns to my office for at risk foot care.  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  This patient is unable to cut nails herself since the patient cannot reach her  nails.These nails are painful walking and wearing shoes.  This patient presents for at risk foot care today.  General Appearance  Alert, conversant and in no acute stress.  Vascular  Dorsalis pedis and posterior tibial  pulses are palpable  bilaterally.  Capillary return is within normal limits  bilaterally. Temperature is within normal limits  bilaterally.  Neurologic  Senn-Weinstein monofilament wire test within normal limits  bilaterally. Muscle power within normal limits bilaterally.  Nails Thick disfigured discolored nails with subungual debris  from hallux to fifth toes bilaterally. No evidence of bacterial infection or drainage bilaterally.  Orthopedic  No limitations of motion  feet .  No crepitus or effusions noted.  Contracted digits  B/L.  Severe PTTD left foot.   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  Hammer toes 2,3 right foot.  Skin  normotropic skin with no porokeratosis noted bilaterally.  No signs of infections or ulcers noted.     Onychomycosis  Pain in right toes  Pain in left toes  Consent was obtained for treatment procedures.   Mechanical debridement of nails 1-5  bilaterally performed with a nail nipper.  Filed with dremel without incident. No infection or ulcer.     Return office visit 3 months         Told patient to return for periodic foot care and evaluation due to potential at risk complications.   Helane Gunther DPM

## 2019-10-14 ENCOUNTER — Other Ambulatory Visit: Payer: Self-pay | Admitting: Internal Medicine

## 2019-10-25 ENCOUNTER — Other Ambulatory Visit: Payer: Self-pay | Admitting: Internal Medicine

## 2019-11-18 ENCOUNTER — Other Ambulatory Visit: Payer: Self-pay | Admitting: Internal Medicine

## 2019-12-04 ENCOUNTER — Other Ambulatory Visit: Payer: Self-pay | Admitting: Internal Medicine

## 2019-12-13 ENCOUNTER — Ambulatory Visit: Payer: Medicare Other | Admitting: Podiatry

## 2019-12-13 ENCOUNTER — Encounter: Payer: Self-pay | Admitting: Podiatry

## 2019-12-13 ENCOUNTER — Other Ambulatory Visit: Payer: Self-pay

## 2019-12-13 DIAGNOSIS — E1142 Type 2 diabetes mellitus with diabetic polyneuropathy: Secondary | ICD-10-CM | POA: Diagnosis not present

## 2019-12-13 DIAGNOSIS — M79676 Pain in unspecified toe(s): Secondary | ICD-10-CM

## 2019-12-13 DIAGNOSIS — B351 Tinea unguium: Secondary | ICD-10-CM

## 2019-12-13 NOTE — Progress Notes (Signed)
This patient returns to my office for at risk foot care.  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  This patient is unable to cut nails herself since the patient cannot reach her  nails.These nails are painful walking and wearing shoes.  This patient presents for at risk foot care today.  General Appearance  Alert, conversant and in no acute stress.  Vascular  Dorsalis pedis and posterior tibial  pulses are palpable  bilaterally.  Capillary return is within normal limits  bilaterally. Temperature is within normal limits  bilaterally.  Neurologic  Senn-Weinstein monofilament wire test within normal limits  bilaterally. Muscle power within normal limits bilaterally.  Nails Thick disfigured discolored nails with subungual debris  from hallux to fifth toes bilaterally. No evidence of bacterial infection or drainage bilaterally.  Orthopedic  No limitations of motion  feet .  No crepitus or effusions noted.  Contracted digits  B/L.  Severe PTTD left foot.   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  Hammer toes 2,3 right foot.  Skin  normotropic skin with no porokeratosis noted bilaterally.  No signs of infections or ulcers noted.     Onychomycosis  Pain in right toes  Pain in left toes  Consent was obtained for treatment procedures.   Mechanical debridement of nails 1-5  bilaterally performed with a nail nipper.  Filed with dremel without incident. No infection or ulcer.     Return office visit 3 months         Told patient to return for periodic foot care and evaluation due to potential at risk complications.   Helane Gunther DPM

## 2019-12-28 ENCOUNTER — Ambulatory Visit (INDEPENDENT_AMBULATORY_CARE_PROVIDER_SITE_OTHER): Payer: Medicare Other

## 2019-12-28 VITALS — BP 147/77 | HR 69 | Ht 65.0 in | Wt 283.0 lb

## 2019-12-28 DIAGNOSIS — Z0000 Encounter for general adult medical examination without abnormal findings: Secondary | ICD-10-CM

## 2019-12-28 NOTE — Patient Instructions (Addendum)
Bonnie Hinton , Thank you for taking time to come for your Medicare Wellness Visit. I appreciate your ongoing commitment to your health goals. Please review the following plan we discussed and let me know if I can assist you in the future.   These are the goals we discussed: Goals      Patient Stated   .  Low Carb/Low Cholesterol Diet (pt-stated)       This is a list of the screening recommended for you and due dates:  Health Maintenance  Topic Date Due  . COVID-19 Vaccine (1) Never done  . Tetanus Vaccine  Never done  . DEXA scan (bone density measurement)  Never done  . Urine Protein Check  02/06/2019  . Hemoglobin A1C  03/24/2019  . Eye exam for diabetics  10/07/2019  . Flu Shot  10/24/2019  . Complete foot exam   06/02/2020  .  Hepatitis C: One time screening is recommended by Center for Disease Control  (CDC) for  adults born from [birth year] through 1965.   Completed  . Pneumonia vaccines  Completed   Next appointment: Follow up in one year for your annual wellness visit    Preventive Care 65 Years and Older, Female Preventive care refers to lifestyle choices and visits with your health care provider that can promote health and wellness. What does preventive care include?  A yearly physical exam. This is also called an annual well check.  Dental exams once or twice a year.  Routine eye exams. Ask your health care provider how often you should have your eyes checked.  Personal lifestyle choices, including:  Daily care of your teeth and gums.  Regular physical activity.  Eating a healthy diet.  Avoiding tobacco and drug use.  Limiting alcohol use.  Practicing safe sex.  Taking low-dose aspirin every day.  Taking vitamin and mineral supplements as recommended by your health care provider. What happens during an annual well check? The services and screenings done by your health care provider during your annual well check will depend on your age, overall health,  lifestyle risk factors, and family history of disease. Counseling  Your health care provider may ask you questions about your:  Alcohol use.  Tobacco use.  Drug use.  Emotional well-being.  Home and relationship well-being.  Sexual activity.  Eating habits.  History of falls.  Memory and ability to understand (cognition).  Work and work Astronomer.  Reproductive health. Screening  You may have the following tests or measurements:  Height, weight, and BMI.  Blood pressure.  Lipid and cholesterol levels. These may be checked every 5 years, or more frequently if you are over 24 years old.  Skin check.  Lung cancer screening. You may have this screening every year starting at age 79 if you have a 30-pack-year history of smoking and currently smoke or have quit within the past 15 years.  Fecal occult blood test (FOBT) of the stool. You may have this test every year starting at age 79.  Flexible sigmoidoscopy or colonoscopy. You may have a sigmoidoscopy every 5 years or a colonoscopy every 10 years starting at age 79.  Hepatitis C blood test.  Hepatitis B blood test.  Sexually transmitted disease (STD) testing.  Diabetes screening. This is done by checking your blood sugar (glucose) after you have not eaten for a while (fasting). You may have this done every 1-3 years.  Bone density scan. This is done to screen for osteoporosis. You may have this  done starting at age 79.  Mammogram. This may be done every 1-2 years. Talk to your health care provider about how often you should have regular mammograms. Talk with your health care provider about your test results, treatment options, and if necessary, the need for more tests. Vaccines  Your health care provider may recommend certain vaccines, such as:  Influenza vaccine. This is recommended every year.  Tetanus, diphtheria, and acellular pertussis (Tdap, Td) vaccine. You may need a Td booster every 10 years.  Zoster  vaccine. You may need this after age 79.  Pneumococcal 13-valent conjugate (PCV13) vaccine. One dose is recommended after age 79.  Pneumococcal polysaccharide (PPSV23) vaccine. One dose is recommended after age 79. Talk to your health care provider about which screenings and vaccines you need and how often you need them. This information is not intended to replace advice given to you by your health care provider. Make sure you discuss any questions you have with your health care provider. Document Released: 04/07/2015 Document Revised: 11/29/2015 Document Reviewed: 01/10/2015 Elsevier Interactive Patient Education  2017 [employer].  Fall Prevention in the Home Falls can cause injuries. They can happen to people of all ages. There are many things you can do to make your home safe and to help prevent falls. What can I do on the outside of my home?  Regularly fix the edges of walkways and driveways and fix any cracks.  Remove anything that might make you trip as you walk through a door, such as a raised step or threshold.  Trim any bushes or trees on the path to your home.  Use bright outdoor lighting.  Clear any walking paths of anything that might make someone trip, such as rocks or tools.  Regularly check to see if handrails are loose or broken. Make sure that both sides of any steps have handrails.  Any raised decks and porches should have guardrails on the edges.  Have any leaves, snow, or ice cleared regularly.  Use sand or salt on walking paths during winter.  Clean up any spills in your garage right away. This includes oil or grease spills. What can I do in the bathroom?  Use night lights.  Install grab bars by the toilet and in the tub and shower. Do not use towel bars as grab bars.  Use non-skid mats or decals in the tub or shower.  If you need to sit down in the shower, use a plastic, non-slip stool.  Keep the floor dry. Clean up any water that spills on the  floor as soon as it happens.  Remove soap buildup in the tub or shower regularly.  Attach bath mats securely with double-sided non-slip rug tape.  Do not have throw rugs and other things on the floor that can make you trip. What can I do in the bedroom?  Use night lights.  Make sure that you have a light by your bed that is easy to reach.  Do not use any sheets or blankets that are too big for your bed. They should not hang down onto the floor.  Have a firm chair that has side arms. You can use this for support while you get dressed.  Do not have throw rugs and other things on the floor that can make you trip. What can I do in the kitchen?  Clean up any spills right away.  Avoid walking on wet floors.  Keep items that you use a lot in easy-to-reach places.  If you need to reach something above you, use a strong step stool that has a grab bar.  Keep electrical cords out of the way.  Do not use floor polish or wax that makes floors slippery. If you must use wax, use non-skid floor wax.  Do not have throw rugs and other things on the floor that can make you trip. What can I do with my stairs?  Do not leave any items on the stairs.  Make sure that there are handrails on both sides of the stairs and use them. Fix handrails that are broken or loose. Make sure that handrails are as long as the stairways.  Check any carpeting to make sure that it is firmly attached to the stairs. Fix any carpet that is loose or worn.  Avoid having throw rugs at the top or bottom of the stairs. If you do have throw rugs, attach them to the floor with carpet tape.  Make sure that you have a light switch at the top of the stairs and the bottom of the stairs. If you do not have them, ask someone to add them for you. What else can I do to help prevent falls?  Wear shoes that:  Do not have high heels.  Have rubber bottoms.  Are comfortable and fit you well.  Are closed at the toe. Do not wear  sandals.  If you use a stepladder:  Make sure that it is fully opened. Do not climb a closed stepladder.  Make sure that both sides of the stepladder are locked into place.  Ask someone to hold it for you, if possible.  Clearly mark and make sure that you can see:  Any grab bars or handrails.  First and last steps.  Where the edge of each step is.  Use tools that help you move around (mobility aids) if they are needed. These include:  Canes.  Walkers.  Scooters.  Crutches.  Turn on the lights when you go into a dark area. Replace any light bulbs as soon as they burn out.  Set up your furniture so you have a clear path. Avoid moving your furniture around.  If any of your floors are uneven, fix them.  If there are any pets around you, be aware of where they are.  Review your medicines with your doctor. Some medicines can make you feel dizzy. This can increase your chance of falling. Ask your doctor what other things that you can do to help prevent falls. This information is not intended to replace advice given to you by your health care provider. Make sure you discuss any questions you have with your health care provider. Document Released: 01/05/2009 Document Revised: 08/17/2015 Document Reviewed: 04/15/2014 Elsevier Interactive Patient Education  2017 [employer].

## 2019-12-28 NOTE — Progress Notes (Addendum)
Subjective:   Bonnie Hinton is a 79 y.o. female who presents for Medicare Annual (Subsequent) preventive examination.  Review of Systems    No ROS.  Medicare Wellness Virtual Visit.    Cardiac Risk Factors include: advanced age (>64men, >52 women);hypertension;diabetes mellitus     Objective:    Today's Vitals   12/28/19 1108  BP: (!) 147/77  Pulse: 69  Weight: 283 lb (128.4 kg)   Body mass index is 47.09 kg/m.  Advanced Directives 12/28/2019 12/25/2018 12/23/2017 09/04/2016 04/05/2016 01/05/2016 12/21/2015  Does Patient Have a Medical Advance Directive? Yes Yes Yes Yes No;Yes No Yes  Type of Estate agent of [place];Living will Healthcare Power of [place];Living will Living will;Healthcare Power of [employer] Power of [place];Living will - - Living will  Does patient want to make changes to medical advance directive? No - Patient declined No - Patient declined No - Patient declined No - Patient declined - - -  Copy of Healthcare Power of Attorney in Chart? No - copy requested No - copy requested No - copy requested No - copy requested - - No - copy requested  Would patient like information on creating a medical advance directive? - - - - - No - patient declined information -    Current Medications (verified) Outpatient Encounter Medications as of 12/28/2019  Medication Sig   acetaminophen (TYLENOL) 325 MG tablet Take 2 tablets (650 mg total) by mouth every 6 (six) hours as needed for mild pain (or Fever >/= 101).   amLODipine (NORVASC) 10 MG tablet TAKE 1 TABLET DAILY   amoxicillin (AMOXIL) 500 MG capsule TAKE 4 CAPSULES BY MOUTH 1 HOUR PRIOR TO DENTAL PROCEDURE   celecoxib (CELEBREX) 100 MG capsule TAKE 1 CAPSULE TWICE A DAY   Cholecalciferol (VITAMIN D3) 2000 units TABS Take 1 tablet by mouth daily.   cycloSPORINE (RESTASIS) 0.05 % ophthalmic emulsion Place 1 drop into both eyes 2 (two) times daily.   dicyclomine (BENTYL) 20 MG tablet TAKE  1 TABLET FOUR TIMES A DAY BEFORE MEALS AND AT BEDTIME   ezetimibe (ZETIA) 10 MG tablet TAKE 1 TABLET DAILY   furosemide (LASIX) 20 MG tablet TAKE 1 TABLET BY MOUTH DAILY AS NEEDED. FOR FLUID RETENTION NOT MORE THAN EVERY OTHER DAY   Lancets 30G MISC Use to check blood sugars once daily.   levothyroxine (SYNTHROID) 125 MCG tablet TAKE 1 TABLET DAILY BEFORE BREAKFAST   LUMIGAN 0.01 % SOLN Place 1-2 drops into both eyes at bedtime.   metFORMIN (GLUCOPHAGE) 500 MG tablet Take 1 tablet (500 mg total) by mouth daily with breakfast.   nystatin cream (MYCOSTATIN) APPLY TOPICALLY TWICE A DAY   pantoprazole (PROTONIX) 40 MG tablet TAKE 1 TABLET DAILY   tiZANidine (ZANAFLEX) 4 MG capsule TAKE 1 CAPSULE THREE TIMES A DAY AS NEEDED FOR MUSCLE SPASMS   tolterodine (DETROL LA) 4 MG 24 hr capsule TAKE 1 CAPSULE TWICE A DAY   No facility-administered encounter medications on file as of 12/28/2019.    Allergies (verified) Erythromycin, Fentanyl, Iodinated diagnostic agents, Atorvastatin, Victoza [liraglutide], Latex, Lisinopril, and Other   History: Past Medical History:  Diagnosis Date   Anxiety    Arthritis    Choledocholithiasis    Chronic back pain    stenosis and arthritis   Complication of anesthesia    anxious when she wakes up   GERD (gastroesophageal reflux disease)    takes Pantoprazole daily   Glaucoma    dry angle   History  of bronchitis    30+ yrs ago   History of MRSA infection 2004   several yrs ago   Hyperlipidemia    takes Zetia and Crestor daily   Hypertension    take Amlodipine daily   Hypothyroidism    takes Synthroid daily   Joint pain    Joint swelling    Neuropathy    takes Gabapentin daily   Pancreatitis, gallstone    Peripheral edema    takes Furosemide daily as needed   Pneumonia    hx of-30 + yrs ago   Pre-diabetes    takes Victoza daily   Primary localized osteoarthritis of right knee    Subdural hematoma (HCC) 8 yrs ago   hx of   Urinary frequency     Urinary urgency    Past Surgical History:  Procedure Laterality Date   ABDOMINAL HYSTERECTOMY     CHOLECYSTECTOMY N/A 09/09/2016   Procedure: LAPAROSCOPIC CHOLECYSTECTOMY;  Surgeon: Manus Ruddsuei, Matthew, MD;  Location: [facility] OR;  Service: General;  Laterality: N/A;   COLONOSCOPY     RADIOLOGY WITH ANESTHESIA N/A 08/22/2015   Procedure: MRI OF LUMBAR SPINE   (RADIOLOGY WITH ANESTHESIA);  Surgeon: Medication Radiologist, MD;  Location: [facility] OR;  Service: Radiology;  Laterality: N/A;   TOTAL HIP ARTHROPLASTY Right    TOTAL HIP ARTHROPLASTY Left    TOTAL KNEE ARTHROPLASTY Left    TOTAL KNEE ARTHROPLASTY Right 01/01/2016   Procedure: TOTAL KNEE ARTHROPLASTY;  Surgeon: Salvatore Marvelobert Wainer, MD;  [facility];  Service: Orthopedics;  Laterality: Right;   Family History  Problem Relation Age of Onset   Cancer Father    Cancer Brother    Diabetes Maternal Aunt    Social History   Socioeconomic History   Marital status: Widowed    Spouse name: Not on file   Number of children: Not on file   Years of education: Not on file   Highest education level: Not on file  Occupational History   Not on file  Tobacco Use   Smoking status: Never Smoker   Smokeless tobacco: Never Used  Vaping Use   Vaping Use: Never used  Substance and Sexual Activity   Alcohol use: No    Alcohol/week: 0.0 standard drinks   Drug use: No   Sexual activity: Not on file  Other Topics Concern   Not on file  Social History Narrative   Not on file   Social Determinants of Health   Financial Resource Strain: Low Risk    Difficulty of Paying Living Expenses: Not hard at all  Food Insecurity: No Food Insecurity   Worried About Programme researcher, broadcasting/film/videounning Out of Food in the Last Year: Never true   Ran Out of Food in the Last Year: Never true  Transportation Needs: No Transportation Needs   Lack of Transportation (Medical): No   Lack of Transportation (Non-Medical): No  Physical Activity:    Days of Exercise per Week: Not on file   Minutes of  Exercise per Session: Not on file  Stress: No Stress Concern Present   Feeling of Stress : Not at all  Social Connections:    Frequency of Communication with Friends and Family: Not on file   Frequency of Social Gatherings with Friends and Family: Not on file   Attends Religious Services: Not on file   Active Member of Clubs or Organizations: Not on file   Attends BankerClub or Organization Meetings: Not on file   Marital Status: Not on file  Tobacco Counseling Counseling given: Not Answered   Clinical Intake:  Pre-visit preparation completed: Yes        Diabetes: Yes (Followed by pcp)  How often do you need to have someone help you when you read instructions, pamphlets, or other written materials from your doctor or pharmacy?: 1 - Never  Interpreter Needed?: No      Activities of Daily Living In your present state of health, do you have any difficulty performing the following activities: 12/28/2019  Hearing? N  Vision? N  Difficulty concentrating or making decisions? N  Walking or climbing stairs? Y  Dressing or bathing? N  Doing errands, shopping? N  Preparing Food and eating ? N  Using the Toilet? N  In the past six months, have you accidently leaked urine? N  Do you have problems with loss of bowel control? N  Managing your Medications? N  Managing your Finances? N  Housekeeping or managing your Housekeeping? N  Some recent data might be hidden    Patient Care Team: Sherlene Shams, MD as PCP - General (Internal Medicine)  Indicate any recent Medical Services you may have received from other than [facility] providers in the past year (date may be approximate).     Assessment:   This is a routine wellness examination for [place].  I connected with Ivah today by telephone and verified that I am speaking with the correct person using two identifiers. Location patient: home Location provider: work Persons participating in the virtual visit: patient, Engineer, civil (consulting).      I discussed the limitations, risks, security and privacy concerns of performing an evaluation and management service by telephone and the availability of in person appointments. The patient expressed understanding and verbally consented to this telephonic visit.    Interactive audio and video telecommunications were attempted between this provider and patient, however failed, due to patient having technical difficulties OR patient did not have access to video capability.  We continued and completed visit with audio only.  Some vital signs may be absent or patient reported.   Hearing/Vision screen  Hearing Screening   125Hz  250Hz  500Hz  1000Hz  2000Hz  3000Hz  4000Hz  6000Hz  8000Hz   Right ear:           Left ear:           Comments: Patient is able to hear conversational tones without difficulty.  No issues reported.  Vision Screening Comments: Wears corrective lenses  Visual acuity not assessed, virtual visit.  Dietary issues and exercise activities discussed: Current Exercise Habits: Home exercise routine, Type of exercise: stretching, Intensity: Mild  Goals       Patient Stated     Low Carb/Low Cholesterol Diet (pt-stated)       Depression Screen PHQ 2/9 Scores 12/28/2019 12/25/2018 12/23/2017 06/25/2017 05/24/2016 03/27/2016 04/25/2015  PHQ - 2 Score 0 0 0 0 0 6 0  PHQ- 9 Score - - - 1 - 10 -    Fall Risk Fall Risk  12/28/2019 12/25/2018 12/23/2017 06/25/2017 05/24/2016  Falls in the past year? 0 0 No No Yes  Number falls in past yr: 0 - - - 2 or more  Injury with Fall? - - - - No  Follow up Falls evaluation completed - - - -   Handrails in use when climbing stairs? Yes Home free of loose throw rugs in walkways, pet beds, electrical cords, etc? Yes  Adequate lighting in your home to reduce risk of falls? Yes   ASSISTIVE DEVICES UTILIZED TO  PREVENT FALLS: Life alert? No  Use of a cane, walker or w/c? Yes    TIMED UP AND GO: Was the test performed? No .  Virtual visit.   Cognitive  Function: MMSE - Mini Mental State Exam 12/23/2017  Orientation to time 5  Orientation to Place 5  Registration 3  Attention/ Calculation 5  Recall 2  Language- name 2 objects 2  Language- repeat 1  Language- follow 3 step command 3  Language- read & follow direction 1  Write a sentence 1  Copy design 1  Total score 29     6CIT Screen 12/28/2019 12/25/2018  What Year? 0 points 0 points  What month? 0 points 0 points  What time? 0 points 0 points  Count back from 20 - 0 points  Months in reverse - 0 points  Repeat phrase - 0 points  Total Score - 0    Immunizations Immunization History  Administered Date(s) Administered   Fluad Quad(high Dose 65+) 01/01/2019   Influenza, High Dose Seasonal PF 01/23/2015, 12/04/2015, 12/25/2016, 12/23/2017   Pneumococcal Conjugate-13 04/25/2015   Pneumococcal Polysaccharide-23 02/05/2018   Health Maintenance Health Maintenance  Topic Date Due   COVID-19 Vaccine (1) Never done   TETANUS/TDAP  Never done   DEXA SCAN  Never done   URINE MICROALBUMIN  02/06/2019   HEMOGLOBIN A1C  03/24/2019   OPHTHALMOLOGY EXAM  10/07/2019   INFLUENZA VACCINE  10/24/2019   FOOT EXAM  06/02/2020   Hepatitis C Screening  Completed   PNA vac Low Risk Adult  Completed   Dental Screening: Recommended annual dental exams for proper oral hygiene  Community Resource Referral / Chronic Care Management: CRR required this visit?  No   CCM required this visit?  No      Plan:   Keep all routine maintenance appointments.   Follow up 12/31/19 @ 8:30  I have personally reviewed and noted the following in the patient's chart:   Medical and social history Use of alcohol, tobacco or illicit drugs  Current medications and supplements Functional ability and status Nutritional status Physical activity Advanced directives List of other physicians Hospitalizations, surgeries, and ER visits in previous 12 months Vitals Screenings to include cognitive,  depression, and falls Referrals and appointments  In addition, I have reviewed and discussed with patient certain preventive protocols, quality metrics, and best practice recommendations. A written personalized care plan for preventive services as well as general preventive health recommendations were provided to patient via mail.     OBrien-Blaney, Weiland Tomich L, LPN   [id number]    I have reviewed the above information and agree with above.   Duncan Dull, MD

## 2019-12-31 ENCOUNTER — Other Ambulatory Visit: Payer: Self-pay

## 2019-12-31 ENCOUNTER — Encounter: Payer: Self-pay | Admitting: Internal Medicine

## 2019-12-31 ENCOUNTER — Ambulatory Visit: Payer: Medicare Other | Admitting: Internal Medicine

## 2019-12-31 ENCOUNTER — Telehealth: Payer: Self-pay | Admitting: Internal Medicine

## 2019-12-31 VITALS — BP 150/78 | HR 97 | Temp 98.0°F | Resp 17 | Ht 65.0 in | Wt 323.0 lb

## 2019-12-31 DIAGNOSIS — E782 Mixed hyperlipidemia: Secondary | ICD-10-CM

## 2019-12-31 DIAGNOSIS — E039 Hypothyroidism, unspecified: Secondary | ICD-10-CM | POA: Diagnosis not present

## 2019-12-31 DIAGNOSIS — E559 Vitamin D deficiency, unspecified: Secondary | ICD-10-CM

## 2019-12-31 DIAGNOSIS — IMO0001 Reserved for inherently not codable concepts without codable children: Secondary | ICD-10-CM

## 2019-12-31 DIAGNOSIS — Z23 Encounter for immunization: Secondary | ICD-10-CM

## 2019-12-31 DIAGNOSIS — E118 Type 2 diabetes mellitus with unspecified complications: Secondary | ICD-10-CM | POA: Diagnosis not present

## 2019-12-31 DIAGNOSIS — I129 Hypertensive chronic kidney disease with stage 1 through stage 4 chronic kidney disease, or unspecified chronic kidney disease: Secondary | ICD-10-CM

## 2019-12-31 DIAGNOSIS — N3941 Urge incontinence: Secondary | ICD-10-CM

## 2019-12-31 DIAGNOSIS — E1122 Type 2 diabetes mellitus with diabetic chronic kidney disease: Secondary | ICD-10-CM

## 2019-12-31 DIAGNOSIS — I10 Essential (primary) hypertension: Secondary | ICD-10-CM

## 2019-12-31 LAB — URINALYSIS, ROUTINE W REFLEX MICROSCOPIC
Bilirubin Urine: NEGATIVE
Leukocytes,Ua: NEGATIVE
Nitrite: NEGATIVE
Specific Gravity, Urine: >=1.03 — AB (ref 1.000–1.030)
Total Protein, Urine: 30 — AB
Urine Glucose: NEGATIVE
Urobilinogen, UA: 0.2 (ref 0.0–1.0)
pH: 5.5 (ref 5.0–8.0)

## 2019-12-31 LAB — COMPREHENSIVE METABOLIC PANEL
ALT: 21 U/L (ref 0–35)
AST: 32 U/L (ref 0–37)
Albumin: 4.5 g/dL (ref 3.5–5.2)
Alkaline Phosphatase: 77 U/L (ref 39–117)
BUN: 11 mg/dL (ref 6–23)
CO2: 24 mEq/L (ref 19–32)
Calcium: 10.1 mg/dL (ref 8.4–10.5)
Chloride: 99 mEq/L (ref 96–112)
Creatinine, Ser: 1.01 mg/dL (ref 0.40–1.20)
GFR: 52.91 mL/min — ABNORMAL LOW (ref >60.00)
Glucose, Bld: 224 mg/dL — ABNORMAL HIGH (ref 70–99)
Potassium: 4.4 mEq/L (ref 3.5–5.1)
Sodium: 137 mEq/L (ref 135–145)
Total Bilirubin: 0.6 mg/dL (ref 0.2–1.2)
Total Protein: 8 g/dL (ref 6.0–8.3)

## 2019-12-31 LAB — MICROALBUMIN / CREATININE URINE RATIO
Creatinine,U: 224.2 mg/dL
Microalb Creat Ratio: 7 mg/g (ref 0.0–30.0)
Microalb, Ur: 15.6 mg/dL — ABNORMAL HIGH (ref 0.0–1.9)

## 2019-12-31 LAB — LIPID PANEL
Cholesterol: 267 mg/dL — ABNORMAL HIGH (ref 0–200)
HDL: 48.7 mg/dL (ref >39.00)
NonHDL: 218.02
Total CHOL/HDL Ratio: 5
Triglycerides: 234 mg/dL — ABNORMAL HIGH (ref 0.0–149.0)
VLDL: 46.8 mg/dL — ABNORMAL HIGH (ref 0.0–40.0)

## 2019-12-31 LAB — HEMOGLOBIN A1C: Hgb A1c MFr Bld: 10.2 % — ABNORMAL HIGH (ref 4.6–6.5)

## 2019-12-31 LAB — VITAMIN D 25 HYDROXY (VIT D DEFICIENCY, FRACTURES): VITD: 40.57 ng/mL (ref 30.00–100.00)

## 2019-12-31 LAB — LDL CHOLESTEROL, DIRECT: Direct LDL: 181 mg/dL

## 2019-12-31 LAB — TSH: TSH: 13.46 u[IU]/mL — ABNORMAL HIGH (ref 0.35–4.50)

## 2019-12-31 MED ORDER — TETANUS-DIPHTH-ACELL PERTUSSIS 5-2.5-18.5 LF-MCG/0.5 IM SUSP: 0.5000 mL | Freq: Once | INTRAMUSCULAR | 0 refills | Status: AC

## 2019-12-31 MED ORDER — LANCETS 30G MISC: 3 refills | Status: DC

## 2019-12-31 MED ORDER — ONETOUCH VERIO VI STRP: ORAL_STRIP | 3 refills | Status: DC

## 2019-12-31 MED ORDER — METFORMIN HCL ER 750 MG PO TB24: 750.0000 mg | ORAL_TABLET | Freq: Every day | ORAL | 1 refills | Status: DC

## 2019-12-31 NOTE — Progress Notes (Signed)
Subjective:  Patient ID: Bonnie Hinton, female    DOB: 12/25/1940  Age: 79 y.o. MRN: [id number]  CC: The primary encounter diagnosis was Diabetes mellitus with complication (HCC). 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  HPI Bonnie Gashlizabeth A Kwong presents for follow up .  Last seen June 2020.    This visit occurred during the SARS-CoV-2 public health emergency.  Safety protocols were in place, including screening questions prior to the visit, additional usage of staff PPE, and extensive cleaning of exam room while observing appropriate contact time as indicated for disinfecting solutions.   Still Using  A walker.  Pulled a muscle in her back after lifting heavy cinder blocks in her garden recently  .   Blood sugars have been high   fastings  Have been elevated to 250  .  3 am sugars 225 .   Evening sugars 184       5pm 158 post dinner   1:23 pm   250 . Glucose test strips are expired,  Wonders if it has been reading incorrectly.  No recent use of prednisone.  lost to follow up for over 1 year . Has had an eye exam this year .  Has  been taking metformin but stopped last week due to diarrhea  Daughter [name] tested positive for COVID earlier in the week after attending a hight school sporting event as a physical therapist,  and she is pregnant.  No contact with patient .  HTN:  Home Bps  149/74    And 155 /79 . Regimen reviewed.   adding lasix once daily   Declines mammogram. Checks breasts regularly   Needs lancets 30 gauge and test strips    Outpatient Medications Prior to Visit  Medication Sig Dispense Refill  . acetaminophen (TYLENOL) 325 MG tablet Take 2 tablets (650 mg total) by mouth every 6 (six) hours as needed for mild pain (or Fever  >/= 101).    Marland Kitchen. amLODipine (NORVASC) 10 MG tablet TAKE 1 TABLET DAILY 90 tablet 3  . amoxicillin (AMOXIL) 500 MG capsule TAKE 4 CAPSULES BY MOUTH 1 HOUR PRIOR TO DENTAL PROCEDURE 20 capsule 2  . celecoxib (CELEBREX) 100 MG capsule TAKE 1 CAPSULE TWICE A DAY 180 capsule 3  . Cholecalciferol (VITAMIN D3) 2000 units TABS Take 1 tablet by mouth daily.    . cycloSPORINE (RESTASIS) 0.05 % ophthalmic emulsion Place 1 drop into both eyes 2 (two) times daily.    Marland Kitchen. dicyclomine (BENTYL) 20 MG tablet TAKE 1 TABLET FOUR TIMES A DAY BEFORE MEALS AND AT BEDTIME 270 tablet 4  . ezetimibe (ZETIA) 10 MG tablet TAKE 1 TABLET DAILY 90 tablet 3  . LUMIGAN 0.01 % SOLN Place 1-2 drops into both eyes at bedtime.    Marland Kitchen. nystatin cream (MYCOSTATIN) APPLY TOPICALLY TWICE A DAY 90 g 7  . pantoprazole (PROTONIX) 40 MG tablet TAKE 1 TABLET DAILY 90 tablet 3  . tiZANidine (ZANAFLEX) 4 MG capsule TAKE 1 CAPSULE THREE TIMES A DAY AS NEEDED FOR MUSCLE SPASMS 180 capsule 5  . tolterodine (DETROL LA) 4 MG 24 hr capsule TAKE 1 CAPSULE TWICE A DAY 180 capsule 3  . furosemide (LASIX) 20 MG tablet TAKE 1 TABLET BY MOUTH DAILY AS NEEDED. FOR FLUID RETENTION NOT MORE THAN EVERY OTHER DAY  45 tablet 1  . Lancets 30G MISC Use to check blood sugars once daily. 100 each 2  . levothyroxine (SYNTHROID) 125 MCG tablet TAKE 1 TABLET DAILY BEFORE BREAKFAST 90 tablet 3  . metFORMIN (GLUCOPHAGE) 500 MG tablet Take 1 tablet (500 mg total) by mouth daily with breakfast. 90 tablet 3   No facility-administered medications prior to visit.    Review of Systems;  Patient denies headache, fevers, malaise, unintentional weight loss, skin rash, eye pain, sinus congestion and sinus pain, sore throat, dysphagia,  hemoptysis , cough, dyspnea, wheezing, chest pain, palpitations, orthopnea, edema, abdominal pain, nausea, melena, diarrhea, constipation, flank pain, dysuria, hematuria, urinary  Frequency, nocturia, numbness, tingling, seizures,  Focal weakness, Loss  of consciousness,  Tremor, insomnia, depression, anxiety, and suicidal ideation.      Objective:  BP (!) 150/78 (BP Location: Left Arm, Patient Position: Sitting, Cuff Size: Large)   Pulse 97   Temp 98 F (36.7 C) (Oral)   Resp 17   Ht  (1.651 m)   Wt (!) 323 lb (146.5 kg)   SpO2 96%   BMI 53.75 kg/m   BP Readings from Last 3 Encounters:  12/31/19 (!) 150/78  12/28/19 (!) 147/77  09/11/18 (!) 146/71    Wt Readings from Last 3 Encounters:  12/31/19 (!) 323 lb (146.5 kg)  12/28/19 283 lb (128.4 kg)  06/10/18 283 lb (128.4 kg)    General appearance: alert, cooperative and appears stated age Ears: normal TM's and external ear canals both ears Throat: lips, mucosa, and tongue normal; teeth and gums normal Neck: no adenopathy, no carotid bruit, supple, symmetrical, trachea midline and thyroid not enlarged, symmetric, no tenderness/mass/nodules Back: symmetric, no curvature. ROM normal. No CVA tenderness. Lungs: clear to auscultation bilaterally Heart: regular rate and rhythm, S1, S2 normal, no murmur, click, rub or gallop Abdomen: soft, non-tender; bowel sounds normal; no masses,  no organomegaly Pulses: 2+ and symmetric Skin: Skin color, texture, turgor poor/venous stasis noted.   No rashes or lesions Lymph nodes: Cervical, supraclavicular, and axillary nodes normal.  Lab Results  Component Value Date   HGBA1C 10.2 Repeated and verified X2. (H) 12/31/2019   HGBA1C 7.3 (H) 09/22/2018   HGBA1C 7.7 (H) 06/10/2018    Lab Results  Component Value Date   CREATININE 1.01 12/31/2019   CREATININE 0.85 09/22/2018   CREATININE 0.84 03/09/2018    Lab Results  Component Value Date   WBC 8.4 06/10/2018   HGB 14.9 06/10/2018   HCT 45.1 06/10/2018   PLT 306.0 06/10/2018   GLUCOSE 224 (H) 12/31/2019   CHOL 267 (H) 12/31/2019   TRIG 234.0 (H) 12/31/2019   HDL 48.70 12/31/2019   LDLDIRECT 181.0 12/31/2019   LDLCALC 210 (H) 12/25/2016   ALT 21 12/31/2019   AST 32  12/31/2019   NA 137 12/31/2019   K 4.4 12/31/2019   CL 99 12/31/2019   CREATININE 1.01 12/31/2019   BUN 11 12/31/2019   CO2 24 12/31/2019   TSH 13.46 (H) 12/31/2019   INR 1.37 09/04/2016   HGBA1C 10.2 Repeated and verified X2. (H) 12/31/2019   MICROALBUR 15.6 (H) 12/31/2019    DG Chest 2 View  Result Date: 09/04/2016 CLINICAL DATA:  Preop chest exam, bronchitis. EXAM: CHEST  2 VIEW COMPARISON:  None. FINDINGS: The heart size and mediastinal contours are within normal limits. Low lung volumes with mild increase in interstitial prominence suspicious for chronic bronchitic change. No pneumonic consolidation, effusion or pneumothorax. Mild degenerative change along the dorsal  spine. IMPRESSION: Mild interstitial prominence bilaterally which may reflect chronic bronchitic change. No pulmonary consolidations to suggest pneumonia. Electronically Signed   By: Tollie Eth M.D.   On: 09/04/2016 13:51   MR ABDOMEN MRCP WO CONTRAST  Result Date: 09/04/2016 CLINICAL DATA:  Right upper quadrant abdominal pain. Pancreatitis with elevated liver function studies. EXAM: MRI ABDOMEN WITHOUT CONTRAST  (INCLUDING MRCP) TECHNIQUE: Multiplanar multisequence MR imaging of the abdomen was performed. Heavily T2-weighted images of the biliary and pancreatic ducts were obtained, and three-dimensional MRCP images were rendered by post processing. COMPARISON:  Ultrasound today and 04/06/2016. FINDINGS: Despite efforts by the technologist and patient, moderate motion artifact is present on today's exam and could not be eliminated. This reduces exam sensitivity and specificity. Lower chest: The visualized lower chest appears remarkable. No significant pleural effusion. Hepatobiliary: The liver demonstrates loss of signal on the gradient echo opposed phase images consistent with steatosis. No focal lesions are identified. Multiple small gallstones are present. There is no gallbladder wall thickening or surrounding inflammation.  The ERCP images are degraded by motion. No significant biliary dilatation. No definite choledocholithiasis. Pancreas: Fatty replaced with mild surrounding edema. No focal fluid collection or ductal dilatation identified. Spleen: Normal in size without focal abnormality. Adrenals/Urinary Tract: Both adrenal glands appear normal. Tiny cyst in the lower pole of the left kidney. The right kidney appears normal. No hydronephrosis. Stomach/Bowel: No evidence of bowel wall thickening, distention or surrounding inflammatory change. Vascular/Lymphatic: Small lymph nodes in the porta hepatis, likely reactive. No enlarged retroperitoneal lymph nodes. No significant vascular findings on noncontrast imaging identified. Other: No ascites. Musculoskeletal: No acute or significant osseous findings. IMPRESSION: 1. Motion artifact results in suboptimal evaluation of the biliary system. Cholelithiasis without evidence of cholecystitis, significant biliary dilatation or definite choledocholithiasis. 2. Hepatic steatosis. 3. Peripancreatic edema consistent with pancreatitis. No focal fluid collection. Electronically Signed   By: Carey Bullocks M.D.   On: 09/04/2016 13:53   MR 3D Recon At Scanner  Result Date: 09/04/2016 CLINICAL DATA:  Right upper quadrant abdominal pain. Pancreatitis with elevated liver function studies. EXAM: MRI ABDOMEN WITHOUT CONTRAST  (INCLUDING MRCP) TECHNIQUE: Multiplanar multisequence MR imaging of the abdomen was performed. Heavily T2-weighted images of the biliary and pancreatic ducts were obtained, and three-dimensional MRCP images were rendered by post processing. COMPARISON:  Ultrasound today and 04/06/2016. FINDINGS: Despite efforts by the technologist and patient, moderate motion artifact is present on today's exam and could not be eliminated. This reduces exam sensitivity and specificity. Lower chest: The visualized lower chest appears remarkable. No significant pleural effusion. Hepatobiliary: The  liver demonstrates loss of signal on the gradient echo opposed phase images consistent with steatosis. No focal lesions are identified. Multiple small gallstones are present. There is no gallbladder wall thickening or surrounding inflammation. The ERCP images are degraded by motion. No significant biliary dilatation. No definite choledocholithiasis. Pancreas: Fatty replaced with mild surrounding edema. No focal fluid collection or ductal dilatation identified. Spleen: Normal in size without focal abnormality. Adrenals/Urinary Tract: Both adrenal glands appear normal. Tiny cyst in the lower pole of the left kidney. The right kidney appears normal. No hydronephrosis. Stomach/Bowel: No evidence of bowel wall thickening, distention or surrounding inflammatory change. Vascular/Lymphatic: Small lymph nodes in the porta hepatis, likely reactive. No enlarged retroperitoneal lymph nodes. No significant vascular findings on noncontrast imaging identified. Other: No ascites. Musculoskeletal: No acute or significant osseous findings. IMPRESSION: 1. Motion artifact results in suboptimal evaluation of the biliary system. Cholelithiasis without evidence of cholecystitis, significant  biliary dilatation or definite choledocholithiasis. 2. Hepatic steatosis. 3. Peripancreatic edema consistent with pancreatitis. No focal fluid collection. Electronically Signed   By: Carey Bullocks M.D.   On: 09/04/2016 13:53   US Abdomen Limited RUQ  Result Date: 09/04/2016 CLINICAL DATA:  Right upper quadrant pain EXAM: ULTRASOUND ABDOMEN LIMITED RIGHT UPPER QUADRANT COMPARISON:  Ultrasound abdomen 04/06/2016 FINDINGS: Gallbladder: Cholelithiasis. Largest calculus 13 mm. Negative for gallbladder wall thickening. Negative for sonographic Murphy sign Common bile duct: Diameter: 7 mm Liver: Increased echogenicity of the liver without focal liver lesion. IMPRESSION: Cholelithiasis without evidence of cholecystitis Common bile duct upper normal  Echogenic liver compatible with fatty infiltration. Electronically Signed   By: Marlan Palau M.D.   On: 09/04/2016 10:42    Assessment & Plan:   Problem List Items Addressed This Visit      Unprioritized   Acquired hypothyroidism    Thyroid is underactive on 125 mcg daily.  Dose increased to 137 mcg daily    Lab Results  Component Value Date   TSH 13.46 (H) 12/31/2019         Relevant Medications   levothyroxine (SYNTHROID) 137 MCG tablet   Other Relevant Orders   TSH (Completed)   Diabetes mellitus with complication (HCC) - Primary    Previously controlled with metformin which she stopped recently due to recurrent diarrhea.  A1c confirms loss of control reported in home readings of cbgs in the 250 range.  She will need to start insulin.  will refer to [name]  For help in getting her the [facility] monitor . Advised to resume metformin in the XR   Lab Results  Component Value Date   HGBA1C 10.2 Repeated and verified X2. (H) 12/31/2019         Relevant Medications   metFORMIN (GLUCOPHAGE XR) 750 MG 24 hr tablet   Other Relevant Orders   Hemoglobin A1c (Completed)   Comprehensive metabolic panel (Completed)   Microalbumin / creatinine urine ratio (Completed)   Ambulatory referral to Chronic Care Management Services   Essential hypertension, benign    Not at  Goal on maximal dose of amlodipine, losartan and metoprolol.  Adding lasix 20 mg daily in the am       Relevant Medications   furosemide (LASIX) 20 MG tablet   Hyperlipidemia    Intolerant of statins. Taking Zetia , LDL has never been at  goal , which may be due to underactive thyroid, vs noncompliance. Will encourage her to try using statin once or twice per week.  If not tolerated,  Will consider Repatha/   Lab Results  Component Value Date   CHOL 267 (H) 12/31/2019   HDL 48.70 12/31/2019   LDLCALC 210 (H) 12/25/2016   LDLDIRECT 181.0 12/31/2019   TRIG 234.0 (H) 12/31/2019   CHOLHDL 5  12/31/2019         Relevant Medications   furosemide (LASIX) 20 MG tablet   Other Relevant Orders   Lipid panel (Completed)   Ambulatory referral to Chronic Care Management Services   Morbid obesity (HCC)    Will recommend that she resume metformin and consider Trulicity /Ozempic at follow up diabetes       Relevant Medications   metFORMIN (GLUCOPHAGE XR) 750 MG 24 hr tablet   Vitamin D deficiency   Relevant Orders   VITAMIN D 25 Hydroxy (Vit-D Deficiency, Fractures) (Completed)    Other Visit Diagnoses    Urge incontinence of urine  Relevant Orders   Urinalysis, Routine w reflex microscopic (Completed)   Urine Culture (Completed)   Need for immunization against influenza       Relevant Orders   Flu Vaccine QUAD High Dose(Fluad) (Completed)   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 Argote's metFORMIN. I have also changed her furosemide and levothyroxine. Additionally, I am having her start on Tdap and metFORMIN. Lastly, I am having her maintain her acetaminophen, cycloSPORINE, Lumigan, Vitamin D3, amoxicillin, celecoxib, ezetimibe, dicyclomine, amLODipine, tiZANidine, nystatin cream, tolterodine, pantoprazole, Lancets 30G, and OneTouch Verio.  Meds ordered this encounter  Medications  . Tdap (BOOSTRIX) 5-2.5-18.5 LF-MCG/0.5 injection    Sig: Inject 0.5 mLs into the muscle once for 1 dose.    Dispense:  0.5 mL    Refill:  0  . metFORMIN (GLUCOPHAGE XR) 750 MG 24 hr tablet    Sig: Take 1 tablet (750 mg total) by mouth daily with breakfast.    Dispense:  90 tablet    Refill:  1  . Lancets 30G MISC    Sig: Use to check blood sugars once daily.    Dispense:  100 each    Refill:  3  . glucose blood (ONETOUCH VERIO) test strip    Sig: USE TO TEST BLOOD SUGAR ONCE DAILY     Dispense:  100 each    Refill:  3  . furosemide (LASIX) 20 MG tablet    Sig: Take 1 tablet (20 mg total) by mouth daily.    Dispense:  90 tablet    Refill:  1  . levothyroxine (SYNTHROID) 137 MCG tablet    Sig: Take 1 tablet (137 mcg total) by mouth daily before breakfast.    Dispense:  90 tablet    Refill:  1    Medications Discontinued During This Encounter  Medication Reason  . metFORMIN (GLUCOPHAGE) 500 MG tablet   . Lancets 30G MISC Reorder  . furosemide (LASIX) 20 MG tablet   . levothyroxine (SYNTHROID) 125 MCG tablet     Follow-up: No follow-ups on file.   Sherlene Shams, MD

## 2019-12-31 NOTE — Telephone Encounter (Signed)
FYI

## 2019-12-31 NOTE — Patient Instructions (Addendum)
Start taking lasix 20 mg daily in the morning .  continue amlodipine at night   Medication change metformin xr   750 mg daily In the morning  [facility]  Increase the current metformin to twice daily until then if tolerated  Please call us back with the name and contact information of your last eye doctor/exam  You need your tetanus-diptheria-pertussis vaccine (TDaP) but you can get it for less $$$ at a local pharmacy with the script I have provided you.   Diabetes Mellitus and Standards of Medical Care Managing diabetes (diabetes mellitus) can be complicated. Your diabetes treatment may be managed by a team of health care providers, including:  A physician who specializes in diabetes (endocrinologist).  A nurse practitioner or physician assistant.  Nurses.  A diet and nutrition specialist (registered dietitian).  A certified diabetes educator (CDE).  An exercise specialist.  A pharmacist.  An eye doctor.  A foot specialist (podiatrist).  A dentist.  A primary care provider.  A mental health provider. Your health care providers follow guidelines to help you get the best quality of care. The following schedule is a general guideline for your diabetes management plan. Your health care providers may give you more specific instructions. Physical exams Upon being diagnosed with diabetes mellitus, and each year after that, your health care provider will ask about your medical and family history. He or she will also do a physical exam. Your exam may include:  Measuring your height, weight, and body mass index (BMI).  Checking your blood pressure. This will be done at every routine medical visit. Your target blood pressure may vary depending on your medical conditions, your age, and other factors.  Thyroid gland exam.  Skin exam.  Screening for damage to your nerves (peripheral neuropathy). This may include checking the pulse in your legs and feet and checking  the level of sensation in your hands and feet.  A complete foot exam to inspect the structure and skin of your feet, including checking for cuts, bruises, redness, blisters, sores, or other problems.  Screening for blood vessel (vascular) problems, which may include checking the pulse in your legs and feet and checking your temperature. Blood tests Depending on your treatment plan and your personal needs, you may have the following tests done:  HbA1c (hemoglobin A1c). This test provides information about blood sugar (glucose) control over the previous 2-3 months. It is used to adjust your treatment plan, if needed. This test will be done: ? At least 2 times a year, if you are meeting your treatment goals. ? 4 times a year, if you are not meeting your treatment goals or if treatment goals have changed.  Lipid testing, including total, LDL, and HDL cholesterol and triglyceride levels. ? The goal for LDL is less than 100 mg/dL (5.5 mmol/L). If you are at high risk for complications, the goal is less than 70 mg/dL (3.9 mmol/L). ? The goal for HDL is 40 mg/dL (2.2 mmol/L) or higher for men and 50 mg/dL (2.8 mmol/L) or higher for women. An HDL cholesterol of 60 mg/dL (3.3 mmol/L) or higher gives some protection against heart disease. ? The goal for triglycerides is less than 150 mg/dL (8.3 mmol/L).  Liver function tests.  Kidney function tests.  Thyroid function tests. Dental and eye exams  Visit your dentist two times a year.  If you have type 1 diabetes, your health care provider may recommend an eye exam 3-5 years after you are  diagnosed, and then once a year after your first exam. ? For children with type 1 diabetes, a health care provider may recommend an eye exam when your child is age 54 or older and has had diabetes for 3-5 years. After the first exam, your child should get an eye exam once a year.  If you have type 2 diabetes, your health care provider may recommend an eye exam as  soon as you are diagnosed, and then once a year after your first exam. Immunizations   The yearly flu (influenza) vaccine is recommended for everyone 6 months or older who has diabetes.  The pneumonia (pneumococcal) vaccine is recommended for everyone 2 years or older who has diabetes. If you are 30 or older, you may get the pneumonia vaccine as a series of two separate shots.  The hepatitis B vaccine is recommended for adults shortly after being diagnosed with diabetes.  Adults and children with diabetes should receive all other vaccines according to age-specific recommendations from the Centers for Disease Control and Prevention (CDC). Mental and emotional health Screening for symptoms of eating disorders, anxiety, and depression is recommended at the time of diagnosis and afterward as needed. If your screening shows that you have symptoms (positive screening result), you may need more evaluation and you may work with a mental health care provider. Treatment plan Your treatment plan will be reviewed at every medical visit. You and your health care provider will discuss:  How you are taking your medicines, including insulin.  Any side effects you are experiencing.  Your blood glucose target goals.  The frequency of your blood glucose monitoring.  Lifestyle habits, such as activity level as well as tobacco, alcohol, and substance use. Diabetes self-management education Your health care provider will assess how well you are monitoring your blood glucose levels and whether you are taking your insulin correctly. He or she may refer you to:  A certified diabetes educator to manage your diabetes throughout your life, starting at diagnosis.  A registered dietitian who can create or review your personal nutrition plan.  An exercise specialist who can discuss your activity level and exercise plan. Summary  Managing diabetes (diabetes mellitus) can be complicated. Your diabetes treatment  may be managed by a team of health care providers.  Your health care providers follow guidelines in order to help you get the best quality of care.  Standards of care including having regular physical exams, blood tests, blood pressure monitoring, immunizations, screening tests, and education about how to manage your diabetes.  Your health care providers may also give you more specific instructions based on your individual health. This information is not intended to replace advice given to you by your health care provider. Make sure you discuss any questions you have with your health care provider. Document Revised: 11/28/2017 Document Reviewed: 12/08/2015 Elsevier Patient Education  2020 [employer].

## 2019-12-31 NOTE — Telephone Encounter (Signed)
Patient saw Dr. Darrick Huntsman today, Dr. Darrick Huntsman wanted to know who her eye Dr was. Dr. Jonette Pesa, [facility] , [phone]. Last appointment was Jan., 2021

## 2020-01-02 LAB — URINE CULTURE
MICRO NUMBER:: 11049647
SPECIMEN QUALITY:: ADEQUATE

## 2020-01-02 MED ORDER — FUROSEMIDE 20 MG PO TABS: 20.0000 mg | ORAL_TABLET | Freq: Every day | ORAL | 1 refills | Status: DC

## 2020-01-02 MED ORDER — LEVOTHYROXINE SODIUM 137 MCG PO TABS
137.0000 ug | ORAL_TABLET | Freq: Every day | ORAL | 1 refills | Status: DC
Start: 2020-01-02 — End: 2020-06-12

## 2020-01-02 NOTE — Assessment & Plan Note (Signed)
Not at  Goal on maximal dose of amlodipine, losartan and metoprolol.  Adding lasix 20 mg daily in the am

## 2020-01-02 NOTE — Assessment & Plan Note (Addendum)
Previously controlled with metformin which she stopped recently due to recurrent diarrhea.  A1c confirms loss of control reported in home readings of cbgs in the 250 range.  She will need to start insulin.  will refer to [name]  For help in getting her the [facility] monitor . Advised to resume metformin in the XR   Lab Results  Component Value Date   HGBA1C 10.2 Repeated and verified X2. (H) 12/31/2019

## 2020-01-02 NOTE — Assessment & Plan Note (Addendum)
Intolerant of statins. Taking Zetia , LDL has never been at  goal , which may be due to underactive thyroid, vs noncompliance. Will encourage her to try using statin once or twice per week.  If not tolerated,  Will consider Repatha/   Lab Results  Component Value Date   CHOL 267 (H) 12/31/2019   HDL 48.70 12/31/2019   LDLCALC 210 (H) 12/25/2016   LDLDIRECT 181.0 12/31/2019   TRIG 234.0 (H) 12/31/2019   CHOLHDL 5 12/31/2019

## 2020-01-02 NOTE — Assessment & Plan Note (Signed)
Will recommend that she resume metformin and consider Trulicity /Ozempic at follow up diabetes

## 2020-01-02 NOTE — Assessment & Plan Note (Addendum)
Thyroid is underactive on 125 mcg daily.  Dose increased to 137 mcg daily    Lab Results  Component Value Date   TSH 13.46 (H) 12/31/2019

## 2020-01-02 NOTE — Progress Notes (Signed)
Diabetes is wildly out of control, and cholesterol is too high as well. You are going to have to start insulin to get your diabetes under control.  I  am making a referral to our in house clinical pharmacist ,  [name],  who can help get your medications, your continuous blood glucose monitoring system so you won't have to keep pricking your finger.  Please schedule a follow up visit with me in 3 months.

## 2020-01-03 NOTE — Telephone Encounter (Signed)
Please call and request diabetic eye exam report

## 2020-01-04 ENCOUNTER — Telehealth: Payer: Self-pay

## 2020-01-04 NOTE — Chronic Care Management (AMB) (Signed)
  Chronic Care Management   Note  01/04/2020 Name: Bonnie Hinton MRN: [id number] DOB: 02/02/1941  Bonnie Hinton is a 79 y.o. year old female who is a primary care patient of Derrel Nip, Aris Everts, MD. I reached out to [name] by phone today in response to a referral sent by Ms. Real Cons Einstein's PCP, Dr. Derrel Nip     Ms. Croft was given information about Chronic Care Management services today including:  1. CCM service includes personalized support from designated clinical staff supervised by her physician, including individualized plan of care and coordination with other care providers 2. 24/7 contact phone numbers for assistance for urgent and routine care needs. 3. Service will only be billed when office clinical staff spend 20 minutes or more in a month to coordinate care. 4. Only one practitioner may furnish and bill the service in a calendar month. 5. The patient may stop CCM services at any time (effective at the end of the month) by phone call to the office staff. 6. The patient will be responsible for cost sharing (co-pay) of up to 20% of the service fee (after annual deductible is met).  Patient agreed to services and verbal consent obtained.   Follow up plan: Telephone appointment with care management team member scheduled for:01/11/2020  Noreene Larsson, [place], [place], [place] [address] Direct Dial: [phone] [email or web address]

## 2020-01-04 NOTE — Telephone Encounter (Signed)
Diabetic eye exam has been requested.

## 2020-01-06 ENCOUNTER — Other Ambulatory Visit: Payer: Self-pay

## 2020-01-11 ENCOUNTER — Ambulatory Visit (INDEPENDENT_AMBULATORY_CARE_PROVIDER_SITE_OTHER): Payer: Medicare Other | Admitting: Pharmacist

## 2020-01-11 ENCOUNTER — Other Ambulatory Visit: Payer: Self-pay

## 2020-01-11 DIAGNOSIS — E118 Type 2 diabetes mellitus with unspecified complications: Secondary | ICD-10-CM

## 2020-01-11 DIAGNOSIS — I10 Essential (primary) hypertension: Secondary | ICD-10-CM | POA: Diagnosis not present

## 2020-01-11 DIAGNOSIS — E782 Mixed hyperlipidemia: Secondary | ICD-10-CM | POA: Diagnosis not present

## 2020-01-11 DIAGNOSIS — N3946 Mixed incontinence: Secondary | ICD-10-CM

## 2020-01-11 DIAGNOSIS — E1121 Type 2 diabetes mellitus with diabetic nephropathy: Secondary | ICD-10-CM | POA: Diagnosis not present

## 2020-01-11 DIAGNOSIS — Z8673 Personal history of transient ischemic attack (TIA), and cerebral infarction without residual deficits: Secondary | ICD-10-CM

## 2020-01-11 MED ORDER — LANTUS SOLOSTAR 100 UNIT/ML ~~LOC~~ SOPN: 30.0000 [IU] | PEN_INJECTOR | Freq: Every day | SUBCUTANEOUS | 3 refills | Status: DC

## 2020-01-11 MED ORDER — INSULIN GLARGINE 100 UNIT/ML ~~LOC~~ SOLN: 10.0000 [IU] | Freq: Every day | SUBCUTANEOUS | 1 refills | Status: DC

## 2020-01-11 MED ORDER — PEN NEEDLES 32G X 5 MM MISC: 1.0000 "pen " | Freq: Every day | 3 refills | Status: DC

## 2020-01-11 NOTE — Chronic Care Management (AMB) (Signed)
Chronic Care Management   Note  01/11/2020 Name: KORALYN PRESTAGE MRN: [id number] DOB: 10-18-1940   Subjective:  Bonnie Hinton is a 79 y.o. year old female who is a primary care patient of Tullo, Aris Everts, MD. The CCM team was consulted for assistance with chronic disease management and care coordination needs.     Contacted patient for initial medication access and medication management support  Ms. Kennebrew was given information about Chronic Care Management services today including:  1. CCM service includes personalized support from designated clinical staff supervised by her physician, including individualized plan of care and coordination with other care providers 2. 24/7 contact phone numbers for assistance for urgent and routine care needs. 3. Service will only be billed when office clinical staff spend 20 minutes or more in a month to coordinate care. 4. Only one practitioner may furnish and bill the service in a calendar month. 5. The patient may stop CCM services at any time (effective at the end of the month) by phone call to the office staff. 6. The patient will be responsible for cost sharing (co-pay) of up to 20% of the service fee (after annual deductible is met).  Patient agreed to services and verbal consent obtained.   Review of patient status, including review of consultants reports, laboratory and other test data, was performed as part of comprehensive evaluation and provision of chronic care management services.   SDOH (Social Determinants of Health) assessments and interventions performed:  SDOH Interventions     Most Recent Value  SDOH Interventions  Food Insecurity Interventions Intervention Not Indicated  Financial Strain Interventions Intervention Not Indicated       Objective:  Lab Results  Component Value Date   CREATININE 1.01 12/31/2019   CREATININE 0.85 09/22/2018   CREATININE 0.84 03/09/2018    Lab Results  Component Value Date    HGBA1C 10.2 Repeated and verified X2. (H) 12/31/2019       Component Value Date/Time   CHOL 267 (H) 12/31/2019 0913   TRIG 234.0 (H) 12/31/2019 0913   HDL 48.70 12/31/2019 0913   CHOLHDL 5 12/31/2019 0913   VLDL 46.8 (H) 12/31/2019 0913   LDLCALC 210 (H) 12/25/2016 1005   LDLDIRECT 181.0 12/31/2019 0913    Clinical ASCVD: No  The 10-year ASCVD risk score Mikey Bussing DC Jr., et al., 2013) is: 61.5%   Values used to calculate the score:     Age: 60 years     Sex: Female     Is Non-Hispanic African American: No     Diabetic: Yes     Tobacco smoker: No     Systolic Blood Pressure: 614 mmHg     Is BP treated: Yes     HDL Cholesterol: 48.7 mg/dL     Total Cholesterol: 267 mg/dL    BP Readings from Last 3 Encounters:  12/31/19 (!) 150/78  12/28/19 (!) 147/77  09/11/18 (!) 146/71    Allergies  Allergen Reactions  . Erythromycin Rash  . Fentanyl Other (See Comments)    Duragesic-25 Duragesic-25  . Iodinated Diagnostic Agents Swelling and Rash  . Atorvastatin     "stomach problems"  . Victoza [Liraglutide]     Suspected to be the cause of gallstone pancreatitis June 2018  . Latex Itching and Rash  . Lisinopril Other (See Comments)    Hyperkalemia   . Other Rash    Paper tape, possibly adhesive tape PAPER TAPE     Medications Reviewed Today  Reviewed by De Hollingshead, RPH-CPP (Pharmacist) on 01/11/20 at 1158  Med List Status: <None>  Medication Order Taking? Sig Documenting Provider Last Dose Status Informant  acetaminophen (TYLENOL) 325 MG tablet [id number] Yes Take 2 tablets (650 mg total) by mouth every 6 (six) hours as needed for mild pain (or Fever >/= 101). Shepperson, Kirstin, PA-C Taking Active Self  amLODipine (NORVASC) 10 MG tablet [id number] Yes TAKE 1 TABLET DAILY Crecencio Mc, MD Taking Active   amoxicillin (AMOXIL) 500 MG capsule [id number] No TAKE 4 CAPSULES BY MOUTH 1 HOUR PRIOR TO DENTAL PROCEDURE  Patient not taking: Reported on 01/11/2020   Crecencio Mc, MD Not Taking Active   celecoxib (CELEBREX) 100 MG capsule [id number] Yes TAKE 1 CAPSULE TWICE A DAY Crecencio Mc, MD Taking Active   Cholecalciferol (VITAMIN D3) 2000 units TABS [id number] Yes Take 1 tablet by mouth daily. [provider] Taking Active   cycloSPORINE (RESTASIS) 0.05 % ophthalmic emulsion [id number] Yes Place 1 drop into both eyes 2 (two) times daily. [provider] Taking Active Self  dicyclomine (BENTYL) 20 MG tablet [id number] No TAKE 1 TABLET FOUR TIMES A DAY BEFORE MEALS AND AT BEDTIME  Patient not taking: Reported on 01/11/2020   Crecencio Mc, MD Not Taking Active   ezetimibe (ZETIA) 10 MG tablet [id number] Yes TAKE 1 TABLET DAILY Crecencio Mc, MD Taking Active   furosemide (LASIX) 20 MG tablet [id number]  Take 1 tablet (20 mg total) by mouth daily. Crecencio Mc, MD  Active   glucose blood [facility] VERIO) test strip [id number] Yes USE TO TEST BLOOD SUGAR ONCE DAILY Crecencio Mc, MD Taking Active   Lancets 30G [place] [id number] Yes Use to check blood sugars once daily. Crecencio Mc, MD Taking Active   levothyroxine (SYNTHROID) 137 MCG tablet [id number]  Take 1 tablet (137 mcg total) by mouth daily before breakfast. Crecencio Mc, MD  Active   LUMIGAN 0.01 % SOLN [id number] Yes Place 1-2 drops into both eyes at bedtime. [provider] Taking Active Self           Med Note Valora Piccolo, JESSICA R   Fri Apr 05, 2016  8:56 PM)    metFORMIN (GLUCOPHAGE XR) 750 MG 24 hr tablet [id number] No Take 1 tablet (750 mg total) by mouth daily with breakfast.  Patient not taking: Reported on 01/11/2020   Crecencio Mc, MD Not Taking Active   nystatin cream (MYCOSTATIN) [id number] Yes APPLY TOPICALLY TWICE A DAY Crecencio Mc, MD Taking Active   pantoprazole (PROTONIX) 40 MG tablet [id number] Yes TAKE 1 TABLET DAILY Crecencio Mc, MD Taking Active   tiZANidine (ZANAFLEX) 4 MG capsule [id number] Yes TAKE 1 CAPSULE THREE TIMES A DAY AS NEEDED FOR  MUSCLE SPASMS Crecencio Mc, MD Taking Active   tolterodine (DETROL LA) 4 MG 24 hr capsule [id number] Yes TAKE 1 CAPSULE TWICE A DAY Crecencio Mc, MD Taking Active            Assessment:   Goals Addressed              This Visit's Progress     Patient Stated   .  PharmD "I need help with my health" (pt-stated)        CARE PLAN ENTRY (see longitudinal plan of care for additional care plan information)  Current Barriers:  . Social, financial, community barriers:  o Notes that her daughter recently had COVID so she hasn't taken  her shopping recently. Wonders if eating differently has caused diarrhea, more so than change to XR metformin.  o Denies any cost concerns at this time o Denies food affordability concerns . Diabetes: uncontrolled; complicated by chronic medical conditions including hx gallstone pancreatitis, HLD, HTN, most recent A1c 10.2% o Hx Victoza, but was discontinued after an episode of gallstone pancreatitis (recommended by GI providers while hospitalized).  . Most recent eGFR: 52 mL/min . Current antihyperglycemic regimen: metformin XR 750 mg daily - recently changed from IR 500 mg tablet, has only taken 1 dose because she had diarrhea after taking 1 dose  . Current blood glucose readings:  o Fasting: 200-280s . Cardiovascular risk reduction: o Current hypertensive regimen: amlodipine 10 mg daily, furosemide 20 mg daily o Current hyperlipidemia regimen: ezetimibe 10 mg daily. LDL 180, TG 234 - Reports hx muscle aches and GI upset with atorvastatin, rosuvastatin 5 mg daily. Refuses to retrial.  o Current antiplatelet regimen: n/a . Eye exam: due . Nephropathy screening: up to date . IBS: dicyclomine 20 mg QID PRN - patient notes that she doesn't understand when to take this; pantoprazole 40 mg BID PRN GERD . Arthritic pain, s/p bilateral hip and knee replacements - celecoxib 100 mg BID, acetaminophen PRN, tizanidine 4 mg TID PRN - patient reports she  occasionally will take 4 doses in a day, denies sedation with this medication . Allergies: cetirizine 10 mg daily . Supplements: Vitamin D  Pharmacist Clinical Goal(s):  Marland Kitchen Over the next 90 days, patient will work with PharmD and primary care provider to address optimized glucose management  Interventions: . Comprehensive medication review performed, medication list updated in electronic medical record . Inter-disciplinary care team collaboration (see longitudinal plan of care) . Educated on goal A1c, goal fasting, and goal 2 hour post prandial glucose . Educated on reduced risk of GI upset w/ XR metformin. Discussed that dietary changes could also cause diarrhea, so I would recommend giving new metformin formulation at least a week to see if diarrhea persists. Restart metformin XR 750 mg daily. Will plan to titrate moving forward as able . Discussed need for basal insulin. Patient amenable. Start Lantus 10 units daily. Counseled on administration technique. Discussed likely titration moving forward . Extensive dietary education and counseling regarding carbohydrate choices . Educated on goal LDL and risk of elevated cholesterol. As patient intolerant to statins and LDL remains significantly elevated on ezetimibe, will consider PCSK9i. Will discuss further moving forward.  . Discussed mechanism of dicyclomine. Discussed impact of removal of gallbladder on fat digestion  Patient Self Care Activities:  . Patient will check blood glucose BID , document, and provide at future appointments . Patient will take medications as prescribed . Patient will contact provider with any episodes of hypoglycemia . Patient will report any questions or concerns to provider   Initial goal documentation        Plan: - Scheduled f/u call in ~ 4 weeks  [name], PharmD, [place], [place] Pharmacist [place] [phone]

## 2020-01-11 NOTE — Patient Instructions (Addendum)
It was great talking with you today!  We are starting Lantus. Inject 10 units daily.   Our goal A1c is less than 7%. This corresponds with fasting sugars less than 130 and 2 hour after meal sugars less than 180. Please check your blood sugar twice daily - write down these readings for [place] to review.   Our goal bad cholesterol, or LDL, is less than 70. This is why it is important to continue taking your ezetimibe. We will discuss other options moving forward.   Feel free to call me with any questions or concerns. I look forward to our next phone call.   [name], PharmD, [place], CPP Direct line: [phone] Clinic phone: [phone]  Visit Information  Goals Addressed              This Visit's Progress     Patient Stated   .  PharmD "I need help with my health" (pt-stated)        CARE PLAN ENTRY (see longitudinal plan of care for additional care plan information)  Current Barriers:  . Social, financial, community barriers:  o Notes that her daughter recently had COVID so she hasn't taken her shopping recently. Wonders if eating differently has caused diarrhea, more so than change to XR metformin.  o Denies any cost concerns at this time o Denies food affordability concerns . Diabetes: uncontrolled; complicated by chronic medical conditions including hx gallstone pancreatitis, HLD, HTN, most recent A1c 10.2% o Hx Victoza, but was discontinued after an episode of gallstone pancreatitis (recommended by GI providers while hospitalized).  . Most recent eGFR: 52 mL/min . Current antihyperglycemic regimen: metformin XR 750 mg daily - recently changed from IR 500 mg tablet, has only taken 1 dose because she had diarrhea after taking 1 dose  . Current blood glucose readings:  o Fasting: 200-280s . Cardiovascular risk reduction: o Current hypertensive regimen: amlodipine 10 mg daily, furosemide 20 mg daily o Current hyperlipidemia regimen: ezetimibe 10 mg daily. LDL 180, TG  234 - Reports hx muscle aches and GI upset with atorvastatin, rosuvastatin 5 mg daily. Refuses to retrial.  o Current antiplatelet regimen: n/a . Eye exam: due . Nephropathy screening: up to date . IBS: dicyclomine 20 mg QID PRN - patient notes that she doesn't understand when to take this; pantoprazole 40 mg BID PRN GERD . Arthritic pain, s/p bilateral hip and knee replacements - celecoxib 100 mg BID, acetaminophen PRN, tizanidine 4 mg TID PRN - patient reports she occasionally will take 4 doses in a day, denies sedation with this medication . Allergies: cetirizine 10 mg daily . Supplements: Vitamin D  Pharmacist Clinical Goal(s):  Marland Kitchen Over the next 90 days, patient will work with PharmD and primary care provider to address optimized glucose management  Interventions: . Comprehensive medication review performed, medication list updated in electronic medical record . Inter-disciplinary care team collaboration (see longitudinal plan of care) . Educated on goal A1c, goal fasting, and goal 2 hour post prandial glucose . Reviewed chart. Will discuss w/ PCP if retrial of GLP1 in the future (given lack of gallbladder) would be appropriate.  . Educated on reduced risk of GI upset w/ XR metformin. Discussed that dietary changes could also cause diarrhea, so I would recommend giving new metformin formulation at least a week to see if diarrhea persists. Restart metformin XR 750 mg daily. Will plan to titrate moving forward as able . Discussed need for basal insulin. Patient amenable. Start Lantus 10 units daily. Counseled  on administration technique. Discussed likely titration moving forward . Extensive dietary education and counseling regarding carbohydrate choices . Educated on goal LDL and risk of elevated cholesterol. As patient intolerant to statins and LDL remains significantly elevated on ezetimibe, will consider PCSK9i. Will discuss further moving forward.  . Discussed mechanism of dicyclomine.  Discussed impact of removal of gallbladder on fat digestion  Patient Self Care Activities:  . Patient will check blood glucose BID , document, and provide at future appointments . Patient will take medications as prescribed . Patient will contact provider with any episodes of hypoglycemia . Patient will report any questions or concerns to provider   Initial goal documentation        Ms. Pidgeon was given information about Chronic Care Management services today including:  1. CCM service includes personalized support from designated clinical staff supervised by her physician, including individualized plan of care and coordination with other care providers 2. 24/7 contact phone numbers for assistance for urgent and routine care needs. 3. Service will only be billed when office clinical staff spend 20 minutes or more in a month to coordinate care. 4. Only one practitioner may furnish and bill the service in a calendar month. 5. The patient may stop CCM services at any time (effective at the end of the month) by phone call to the office staff. 6. The patient will be responsible for cost sharing (co-pay) of up to 20% of the service fee (after annual deductible is met).  Patient agreed to services and verbal consent obtained.   Print copy of patient instructions provided.    Plan: - Scheduled f/u call in ~ 4 weeks  [name], PharmD, [place], [place] Pharmacist [place] [phone]

## 2020-01-11 NOTE — Progress Notes (Signed)
I have collaborated with the care management provider regarding care management and care coordination activities outlined in this encounter and have reviewed this encounter including documentation in the note and care plan. I am certifying that I agree with the content of this note and encounter as supervising physician.  

## 2020-01-12 ENCOUNTER — Telehealth: Payer: Self-pay

## 2020-01-12 DIAGNOSIS — E118 Type 2 diabetes mellitus with unspecified complications: Secondary | ICD-10-CM

## 2020-01-12 MED ORDER — TRESIBA FLEXTOUCH 100 UNIT/ML ~~LOC~~ SOPN: PEN_INJECTOR | SUBCUTANEOUS | 2 refills | Status: DC

## 2020-01-12 NOTE — Telephone Encounter (Signed)
Pt said the pharmacy can't fill her prescription because it is on back order and they need to know what she can use in place of the Lantus solostar pen. Pt said she needs this taken care of today because she is getting the medication from [facility] and everyday it is a day that she doesn't have her medication. She needs a call to the pharmacy as soon as possible.

## 2020-01-12 NOTE — Progress Notes (Signed)
Sent alternative [place]. Notified patient.

## 2020-01-17 ENCOUNTER — Ambulatory Visit: Payer: Medicare Other | Admitting: Pharmacist

## 2020-01-17 DIAGNOSIS — E118 Type 2 diabetes mellitus with unspecified complications: Secondary | ICD-10-CM

## 2020-01-17 MED ORDER — LANTUS SOLOSTAR 100 UNIT/ML ~~LOC~~ SOPN: PEN_INJECTOR | SUBCUTANEOUS | 3 refills | Status: DC

## 2020-01-17 NOTE — Chronic Care Management (AMB) (Signed)
Chronic Care Management   Follow Up Note   01/17/2020 Name: Bonnie Hinton MRN: [id number] DOB: 1940/07/11  Referred by: Crecencio Mc, MD Reason for referral : Chronic Care Management (Medication Management)   JOHNNIE Hinton is a 79 y.o. year old female who is a primary care patient of Tullo, Aris Everts, MD. The CCM team was consulted for assistance with chronic disease management and care coordination needs.    Patient called over the weekend for medication management.   Review of patient status, including review of consultants reports, relevant laboratory and other test results, and collaboration with appropriate care team members and the patient's provider was performed as part of comprehensive patient evaluation and provision of chronic care management services.    SDOH (Social Determinants of Health) assessments performed: No See Care Plan activities for detailed interventions related to [facility])     Outpatient Encounter Medications as of 01/17/2020  Medication Sig  . acetaminophen (TYLENOL) 325 MG tablet Take 2 tablets (650 mg total) by mouth every 6 (six) hours as needed for mild pain (or Fever >/= 101).  Marland Kitchen amLODipine (NORVASC) 10 MG tablet TAKE 1 TABLET DAILY  . amoxicillin (AMOXIL) 500 MG capsule TAKE 4 CAPSULES BY MOUTH 1 HOUR PRIOR TO DENTAL PROCEDURE (Patient not taking: Reported on 01/11/2020)  . celecoxib (CELEBREX) 100 MG capsule TAKE 1 CAPSULE TWICE A DAY  . cetirizine (ZYRTEC) 10 MG tablet Take 10 mg by mouth daily.  . Cholecalciferol (VITAMIN D3) 2000 units TABS Take 1 tablet by mouth daily.  . cycloSPORINE (RESTASIS) 0.05 % ophthalmic emulsion Place 1 drop into both eyes 2 (two) times daily.  Marland Kitchen dicyclomine (BENTYL) 20 MG tablet TAKE 1 TABLET FOUR TIMES A DAY BEFORE MEALS AND AT BEDTIME (Patient not taking: Reported on 01/11/2020)  . ezetimibe (ZETIA) 10 MG tablet TAKE 1 TABLET DAILY  . furosemide (LASIX) 20 MG tablet Take 1 tablet (20 mg total) by mouth  daily.  Marland Kitchen glucose blood (ONETOUCH VERIO) test strip USE TO TEST BLOOD SUGAR ONCE DAILY  . insulin glargine (LANTUS SOLOSTAR) 100 UNIT/ML Solostar Pen Inject 10 units daily. Titrate as instructed. Max daily dose 30 units.  . Insulin Pen Needle (PEN NEEDLES) 32G X 5 MM MISC 1 pen by Does not apply route daily.  . Lancets 30G MISC Use to check blood sugars once daily.  Marland Kitchen levothyroxine (SYNTHROID) 137 MCG tablet Take 1 tablet (137 mcg total) by mouth daily before breakfast.  . LUMIGAN 0.01 % SOLN Place 1-2 drops into both eyes at bedtime.  . metFORMIN (GLUCOPHAGE XR) 750 MG 24 hr tablet Take 1 tablet (750 mg total) by mouth daily with breakfast. (Patient not taking: Reported on 01/11/2020)  . nystatin cream (MYCOSTATIN) APPLY TOPICALLY TWICE A DAY  . pantoprazole (PROTONIX) 40 MG tablet TAKE 1 TABLET DAILY  . tiZANidine (ZANAFLEX) 4 MG capsule TAKE 1 CAPSULE THREE TIMES A DAY AS NEEDED FOR MUSCLE SPASMS  . tolterodine (DETROL LA) 4 MG 24 hr capsule TAKE 1 CAPSULE TWICE A DAY  . [DISCONTINUED] insulin degludec (TRESIBA FLEXTOUCH) 100 UNIT/ML FlexTouch Pen Inject 10 units daily. Titrate as instructed. Max daily dose 30 units   No facility-administered encounter medications on file as of 01/17/2020.     Objective:   Goals Addressed              This Visit's Progress     Patient Stated   .  PharmD "I need help with my health" (pt-stated)  CARE PLAN ENTRY (see longitudinal plan of care for additional care plan information)  Current Barriers:  . Social, financial, community barriers:  o Notes that the mail order pharmacy still has not been able to fill [place] (previously sent Lantus, but they noted to her that this is on backorder) because of a question for the prescriber.  . Diabetes: uncontrolled; complicated by chronic medical conditions including hx gallstone pancreatitis, HLD, HTN, most recent A1c 10.2% o Hx Victoza, but was discontinued after an episode of gallstone  pancreatitis (recommended by GI providers while hospitalized), will avoid GLP1 moving forward . Most recent eGFR: 52 mL/min . Current antihyperglycemic regimen: metformin XR 750 mg daily - recently changed from IR 500 mg tablet, has only taken 1 dose because she had diarrhea after taking 1 dose. Starting insulin, but communication barriers with the pharmacy . Cardiovascular risk reduction: o Current hypertensive regimen: amlodipine 10 mg daily, furosemide 20 mg daily o Current hyperlipidemia regimen: ezetimibe 10 mg daily. LDL 180, TG 234 - Reports hx muscle aches and GI upset with atorvastatin, rosuvastatin 5 mg daily. Refuses retrial.  o Current antiplatelet regimen: n/a . Eye exam: due . Nephropathy screening: up to date . IBS: dicyclomine 20 mg QID PRN; pantoprazole 40 mg BID PRN GERD . Arthritic pain, s/p bilateral hip and knee replacements - celecoxib 100 mg BID, acetaminophen PRN, tizanidine 4 mg TID PRN - patient reports she occasionally will take 4 doses in a day, denies sedation with this medication . Allergies: cetirizine 10 mg daily . Supplements: Vitamin D  Pharmacist Clinical Goal(s):  Marland Kitchen Over the next 90 days, patient will work with PharmD and primary care provider to address optimized glucose management  Interventions: . [place]. They filled the Lantus script and mailed to patient yesterday. She is aware.  Patient Self Care Activities:  . Patient will check blood glucose BID , document, and provide at future appointments . Patient will take medications as prescribed . Patient will contact provider with any episodes of hypoglycemia . Patient will report any questions or concerns to provider   Please see past updates related to this goal by clicking on the "Past Updates" button in the selected goal          Plan:  - Will outreach as previously scheduled  [name], PharmD, [place], [place] Pharmacist [place] [phone]

## 2020-01-17 NOTE — Patient Instructions (Signed)
Visit Information  Goals Addressed              This Visit's Progress     Patient Stated   .  PharmD "I need help with my health" (pt-stated)        CARE PLAN ENTRY (see longitudinal plan of care for additional care plan information)  Current Barriers:  . Social, financial, community barriers:  o Notes that the mail order pharmacy still has not been able to fill [place] (previously sent Lantus, but they noted to her that this is on backorder) because of a question for the prescriber.  . Diabetes: uncontrolled; complicated by chronic medical conditions including hx gallstone pancreatitis, HLD, HTN, most recent A1c 10.2% o Hx Victoza, but was discontinued after an episode of gallstone pancreatitis (recommended by GI providers while hospitalized), will avoid GLP1 moving forward . Most recent eGFR: 52 mL/min . Current antihyperglycemic regimen: metformin XR 750 mg daily - recently changed from IR 500 mg tablet, has only taken 1 dose because she had diarrhea after taking 1 dose. Starting insulin, but communication barriers with the pharmacy . Cardiovascular risk reduction: o Current hypertensive regimen: amlodipine 10 mg daily, furosemide 20 mg daily o Current hyperlipidemia regimen: ezetimibe 10 mg daily. LDL 180, TG 234 - Reports hx muscle aches and GI upset with atorvastatin, rosuvastatin 5 mg daily. Refuses retrial.  o Current antiplatelet regimen: n/a . Eye exam: due . Nephropathy screening: up to date . IBS: dicyclomine 20 mg QID PRN; pantoprazole 40 mg BID PRN GERD . Arthritic pain, s/p bilateral hip and knee replacements - celecoxib 100 mg BID, acetaminophen PRN, tizanidine 4 mg TID PRN - patient reports she occasionally will take 4 doses in a day, denies sedation with this medication . Allergies: cetirizine 10 mg daily . Supplements: Vitamin D  Pharmacist Clinical Goal(s):  Marland Kitchen Over the next 90 days, patient will work with PharmD and primary care provider to address optimized  glucose management  Interventions: . [facility]. They filled the Lantus script and mailed to patient yesterday. She is aware.  Patient Self Care Activities:  . Patient will check blood glucose BID , document, and provide at future appointments . Patient will take medications as prescribed . Patient will contact provider with any episodes of hypoglycemia . Patient will report any questions or concerns to provider   Please see past updates related to this goal by clicking on the "Past Updates" button in the selected goal         The patient verbalized understanding of instructions provided today and declined a print copy of patient instruction materials.   Plan:  - Will outreach as previously scheduled  [name], PharmD, [place], [place] Pharmacist [place] [phone]

## 2020-02-08 ENCOUNTER — Ambulatory Visit: Payer: Medicare Other | Admitting: Pharmacist

## 2020-02-08 DIAGNOSIS — E1121 Type 2 diabetes mellitus with diabetic nephropathy: Secondary | ICD-10-CM

## 2020-02-08 DIAGNOSIS — E118 Type 2 diabetes mellitus with unspecified complications: Secondary | ICD-10-CM

## 2020-02-08 DIAGNOSIS — I10 Essential (primary) hypertension: Secondary | ICD-10-CM

## 2020-02-08 DIAGNOSIS — M48061 Spinal stenosis, lumbar region without neurogenic claudication: Secondary | ICD-10-CM

## 2020-02-08 MED ORDER — LANTUS SOLOSTAR 100 UNIT/ML ~~LOC~~ SOPN: PEN_INJECTOR | SUBCUTANEOUS | 3 refills | Status: DC

## 2020-02-08 NOTE — Chronic Care Management (AMB) (Signed)
Chronic Care Management   Pharmacy Note  02/08/2020 Name: Bonnie Hinton MRN: [id number] DOB: [birth year]   Subjective:  Bonnie Hinton is a 79 y.o. year old female who is a primary care patient of Tullo, Mar Daring, MD. The CCM team was consulted for assistance with chronic disease management and care coordination needs.    Engaged with patient by telephone for follow up visit in response to provider referral for pharmacy case management and/or care coordination services.   Consent to Services:  Ms. Beckworth was given information about Chronic Care Management services, agreed to services, and gave verbal consent prior to initiation of services on 01/11/20. Please see initial visit note for detailed documentation.   Review of patient status, including review of consultants reports, laboratory and other test data, was performed as part of comprehensive evaluation and provision of chronic care management services.   SDOH (Social Determinants of Health) assessments and interventions performed:  SDOH Interventions     Most Recent Value  SDOH Interventions  Food Insecurity Interventions Intervention Not Indicated  Financial Strain Interventions Intervention Not Indicated       Objective:  Lab Results  Component Value Date   CREATININE 1.01 12/31/2019   CREATININE 0.85 09/22/2018   CREATININE 0.84 03/09/2018    Lab Results  Component Value Date   HGBA1C 10.2 Repeated and verified X2. (H) 12/31/2019       Component Value Date/Time   CHOL 267 (H) 12/31/2019 0913   TRIG 234.0 (H) 12/31/2019 0913   HDL 48.70 12/31/2019 0913   CHOLHDL 5 12/31/2019 0913   VLDL 46.8 (H) 12/31/2019 0913   LDLCALC 210 (H) 12/25/2016 1005   LDLDIRECT 181.0 12/31/2019 0913    Last vitamin D Lab Results  Component Value Date   VD25OH 40.57 12/31/2019    Clinical ASCVD: No  The 10-year ASCVD risk score Denman George DC Jr., et al., 2013) is: 61.5%   Values used to calculate the score:      Age: 57 years     Sex: Female     Is Non-Hispanic African American: No     Diabetic: Yes     Tobacco smoker: No     Systolic Blood Pressure: 150 mmHg     Is BP treated: Yes     HDL Cholesterol: 48.7 mg/dL     Total Cholesterol: 267 mg/dL     BP Readings from Last 3 Encounters:  12/31/19 (!) 150/78  12/28/19 (!) 147/77  09/11/18 (!) 146/71    Assessment:   Allergies  Allergen Reactions  . Erythromycin Rash  . Fentanyl Other (See Comments)    Duragesic-25 Duragesic-25  . Iodinated Diagnostic Agents Swelling and Rash  . Atorvastatin     "stomach problems"  . Victoza [Liraglutide]     Suspected to be the cause of gallstone pancreatitis June 2018  . Latex Itching and Rash  . Lisinopril Other (See Comments)    Hyperkalemia   . Other Rash    Paper tape, possibly adhesive tape PAPER TAPE     Medications Reviewed Today    Reviewed by Lourena Simmonds, RPH-CPP (Pharmacist) on 01/11/20 at 1158  Med List Status: <None>  Medication Order Taking? Sig Documenting Provider Last Dose Status Informant  acetaminophen (TYLENOL) 325 MG tablet [id number] Yes Take 2 tablets (650 mg total) by mouth every 6 (six) hours as needed for mild pain (or Fever >/= 101). Shepperson, Kirstin, PA-C Taking Active Self  amLODipine (NORVASC) 10 MG tablet [id number] Yes TAKE 1  TABLET DAILY Sherlene Shams, MD Taking Active   amoxicillin (AMOXIL) 500 MG capsule [id number] No TAKE 4 CAPSULES BY MOUTH 1 HOUR PRIOR TO DENTAL PROCEDURE  Patient not taking: Reported on 01/11/2020   Sherlene Shams, MD Not Taking Active   celecoxib (CELEBREX) 100 MG capsule [id number] Yes TAKE 1 CAPSULE TWICE A DAY Sherlene Shams, MD Taking Active   Cholecalciferol (VITAMIN D3) 2000 units TABS [id number] Yes Take 1 tablet by mouth daily. [provider] Taking Active   cycloSPORINE (RESTASIS) 0.05 % ophthalmic emulsion [id number] Yes Place 1 drop into both eyes 2 (two) times daily. [provider] Taking Active  Self  dicyclomine (BENTYL) 20 MG tablet [id number] No TAKE 1 TABLET FOUR TIMES A DAY BEFORE MEALS AND AT BEDTIME  Patient not taking: Reported on 01/11/2020   Sherlene Shams, MD Not Taking Active   ezetimibe (ZETIA) 10 MG tablet [id number] Yes TAKE 1 TABLET DAILY Sherlene Shams, MD Taking Active   furosemide (LASIX) 20 MG tablet [id number]  Take 1 tablet (20 mg total) by mouth daily. Sherlene Shams, MD  Active   glucose blood [facility] VERIO) test strip [id number] Yes USE TO TEST BLOOD SUGAR ONCE DAILY Sherlene Shams, MD Taking Active   Lancets 30G MISC [id number] Yes Use to check blood sugars once daily. Sherlene Shams, MD Taking Active   levothyroxine (SYNTHROID) 137 MCG tablet [id number]  Take 1 tablet (137 mcg total) by mouth daily before breakfast. Sherlene Shams, MD  Active   LUMIGAN 0.01 % SOLN [id number] Yes Place 1-2 drops into both eyes at bedtime. [provider] Taking Active Self           Med Note Derrell Lolling, JESSICA R   Fri Apr 05, 2016  8:56 PM)    metFORMIN (GLUCOPHAGE XR) 750 MG 24 hr tablet [id number] No Take 1 tablet (750 mg total) by mouth daily with breakfast.  Patient not taking: Reported on 01/11/2020   Sherlene Shams, MD Not Taking Active   nystatin cream (MYCOSTATIN) [id number] Yes APPLY TOPICALLY TWICE A DAY Sherlene Shams, MD Taking Active   pantoprazole (PROTONIX) 40 MG tablet [id number] Yes TAKE 1 TABLET DAILY Sherlene Shams, MD Taking Active   tiZANidine (ZANAFLEX) 4 MG capsule [id number] Yes TAKE 1 CAPSULE THREE TIMES A DAY AS NEEDED FOR MUSCLE SPASMS Sherlene Shams, MD Taking Active   tolterodine (DETROL LA) 4 MG 24 hr capsule [id number] Yes TAKE 1 CAPSULE TWICE A DAY Sherlene Shams, MD Taking Active           Patient Active Problem List   Diagnosis Date Noted  . Statin intolerance 03/05/2018  . 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 Candidiasis of skin 06/28/2017  . IBS (irritable bowel syndrome) 06/28/2017  . Screening for colon cancer  12/28/2016  . Idiopathic acute pancreatitis without infection or necrosis   . Diabetes mellitus with complication (HCC)   . Dizziness 05/24/2016  . Anxiety, generalized 02/20/2016  . Arthritis   . Acquired hypothyroidism   . History of CVA (cerebrovascular accident)   . Status post bilateral hip replacements   . Status post right knee replacement   . Degenerative lumbar spinal stenosis   . Essential hypertension, benign 07/25/2015  . Preoperative clearance 07/25/2015  . Hyperlipidemia 07/25/2015  . Mixed stress and urge urinary incontinence 04/26/2015  . Diabetic nephropathy associated with type 2 diabetes mellitus (HCC) 01/24/2015  . Morbid obesity (HCC) 01/24/2015  . Vitamin D deficiency 01/24/2015  .  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 Patient affirms current coverage meets needs.   Patient Care Plan: Medication Management    Problem Identified: Diabetes     Long-Range Goal: Disease Progression Prevention   This Visit's Progress: On track  Priority: High  Note:   Current Barriers:  . Unable to achieve control of diabetes  . Does not adhere to prescribed medication regimen  Pharmacist Clinical Goal(s):  Marland Kitchen Over the next 90 days, patient will improve control of diabetes as evidenced by improvement in A1c. . Over the next 90 days, patient will adhere to prescribed medication regimen.  Interventions: . Inter-disciplinary care team collaboration (see longitudinal plan of care) . Comprehensive medication review performed; medication list updated in electronic medical record  Diabetes: . Uncontrolled/controlled; current treatment: metformin XR 750 mg daily (reports that she had diarrhea so she stopped, but then re-tried by taking medication after a meal and is tolerating this better) and Lantus 10 units daily;  o Hx Victoza, but was discontinued after an episode of gallstone pancreatitis (recommended by  GI providers while hospitalized), will avoid GLP1 moving forward . Current glucose readings: Fasting: 180-220  . Current meal patterns: breakfast: smoothies, eating more salads, fibers. Wonders about appropriate snacks after supper . Counseled on continued dietary modifications. Encourage low carbohydrate snacks before supper if she is hungry . Recommended to increase Lantus to 14 units daily. Patient verbalizes understanding . Encouraged to continue metformin XR 750 mg after a meal  Hypertension: . Controlled; current treatment: amlodipine 10 mg daily, furosemide 20 mg daily . Recommended to continue current treatment regimen  Hyperlipidemia: . Uncontrolled; current treatment: ezetimibe 10 mg daily;  Marland Kitchen Medications previously tried: Reports hx muscle aches and GI upset with atorvastatin, rosuvastatin 5 mg daily. Refuses retrial.  . Recommended to continue current regimen. Could consider option like Nexlizet moving forward to add bempedoic acid.   IBS: . Uncontrolled per patient report; current regimen: dicyclomine 20 mg QID PRN prescribed, but has been taking 40 mg prior to every meal . Discussed potentially supratherapeutic dose of dicyclomine and increased risk of anticholinergic side effects. Recommended to reduce to no more than 1 tab with meals. Suggested GI referral; patient is not interested at this time  GERD: . Controlled per patient report, current treatment: pantoprazole 40 mg BID PRN GERD . 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 Controlled, though patient concerned that acetaminophen is causing GI cramping and diarrhea; current treatment regimen: celecoxib 100 mg BID, acetaminophen PRN, tizanidine 4 mg TID PRN - patient reports she occasionally will take 4 doses in a day, denies sedation with this medication . Discussed that unlikely that APAP is causing GI disturbances, but that she could try lower doses to  see if improvement. Encouraged to continue current regimen  Over the Counter Medications: . Current regimen: cetirizine 10 mg daily for allergies, Vitamin D 1000 units daily . No interactions noted. Recommend to continue current regimen  Patient Goals/Self-Care Activities . Over the next 90 days, patient will:  - take medications as prescribed check blood glucose BID, document, and provide at future appointments  Follow Up Plan: Telephone follow up appointment with care management team member scheduled for: ~3 weeks      Plan: Telephone follow up appointment with care management team member scheduled for: 03/01/20  [name], PharmD, BCACP, CPP Clinical Pharmacist [facility] [employer]  [phone]   

## 2020-02-08 NOTE — Patient Instructions (Addendum)
Ms. Perris,   It was great talking with you today!  Increase Lantus to 14 units daily. Remember, our goal A1c is less than 7%. This corresponds with fasting sugars less than 130 and 2 hour after meal sugars less than 180.   Limit to no more than 1 Bentyl with each meal. Think about if you would like to see a new gastroenterology specialist.   Call with any questions!  [name], PharmD [phone]  Patient Care Plan: Medication Management    Problem Identified: Diabetes     Long-Range Goal: Disease Progression Prevention   This Visit's Progress: On track  Priority: High  Note:   Current Barriers:  . Unable to achieve control of diabetes  . Does not adhere to prescribed medication regimen  Pharmacist Clinical Goal(s):  Marland Kitchen Over the next 90 days, patient will improve control of diabetes as evidenced by improvement in A1c. . Over the next 90 days, patient will adhere to prescribed medication regimen.  Interventions: . Inter-disciplinary care team collaboration (see longitudinal plan of care) . Comprehensive medication review performed; medication list updated in electronic medical record  Diabetes: . Uncontrolled/controlled; current treatment: metformin XR 750 mg daily (reports that she had diarrhea so she stopped, but then re-tried by taking medication after a meal and is tolerating this better) and Lantus 10 units daily;  o Hx Victoza, but was discontinued after an episode of gallstone pancreatitis (recommended by GI providers while hospitalized), will avoid GLP1 moving forward . Current glucose readings: Fasting: 180-220  . Current meal patterns: breakfast: smoothies, eating more salads, fibers. Wonders about appropriate snacks after supper . Counseled on continued dietary modifications. Encourage low carbohydrate snacks before supper if she is hungry . Recommended to increase Lantus to 14 units daily. Patient verbalizes understanding . Encouraged to continue metformin XR  750 mg after a meal  Hypertension: . Controlled; current treatment: amlodipine 10 mg daily, furosemide 20 mg daily . Recommended to continue current treatment regimen  Hyperlipidemia: . Uncontrolled; current treatment: ezetimibe 10 mg daily;  Marland Kitchen Medications previously tried: Reports hx muscle aches and GI upset with atorvastatin, rosuvastatin 5 mg daily. Refuses retrial.  . Recommended to continue current regimen. Could consider option like Nexlizet moving forward to add bempedoic acid.   IBS: . Uncontrolled per patient report; current regimen: dicyclomine 20 mg QID PRN prescribed, but has been taking 40 mg prior to every meal . Discussed potentially supratherapeutic dose of dicyclomine and increased risk of anticholinergic side effects. Recommended to reduce to no more than 1 tab with meals. Suggested GI referral; patient is not interested at this time  GERD: . Controlled per patient report, current treatment: pantoprazole 40 mg BID PRN GERD . 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 Controlled, though patient concerned that acetaminophen is causing GI cramping and diarrhea; current treatment regimen: celecoxib 100 mg BID, acetaminophen PRN, tizanidine 4 mg TID PRN - patient reports she occasionally will take 4 doses in a day, denies sedation with this medication . Discussed that unlikely that APAP is causing GI disturbances, but that she could try lower doses to see if improvement. Encouraged to continue current regimen  Over the Counter Medications: . Current regimen: cetirizine 10 mg daily for allergies, Vitamin D 1000 units daily . No interactions noted. Recommend to continue current regimen  Patient Goals/Self-Care Activities . Over the next 90 days, patient will:  - take medications as prescribed check blood glucose BID, document, and  provide at future appointments  Follow Up Plan: Telephone follow up appointment  with care management team member scheduled for: ~3 weeks      The patient verbalized understanding of instructions, educational materials, and care plan provided today and agreed to receive a mailed copy of patient instructions, educational materials, and care plan.     Plan: Telephone follow up appointment with care management team member scheduled for: 03/01/20  [name], PharmD, [place], CPP Clinical Pharmacist [facility] [employer] [phone]

## 2020-02-14 ENCOUNTER — Telehealth: Payer: Self-pay | Admitting: Internal Medicine

## 2020-02-14 ENCOUNTER — Telehealth: Payer: Medicare Other

## 2020-02-14 NOTE — Telephone Encounter (Addendum)
Contacted [name]. She will be faxing over information about a program the patient is engaging in through her insurance where [name] (CDE) reviews the patient's case with endocrinologists who make recommendations.   [name] recommended we discontinue metformin XR due to patient's diarrhea. Noted that patient did decrease dicyclomine to 1 tab with each meal, but also noted constipation, unclear whether this was improved with decreasing dicyclomine dose.   Will discuss with patient at next scheduled appointment

## 2020-02-14 NOTE — Telephone Encounter (Signed)
Bonnie Hinton with [employer] [phone] called and wanted to go over some diabetic plans and medication for pt

## 2020-03-01 ENCOUNTER — Ambulatory Visit: Payer: Medicare Other | Admitting: Pharmacist

## 2020-03-01 DIAGNOSIS — E782 Mixed hyperlipidemia: Secondary | ICD-10-CM

## 2020-03-01 DIAGNOSIS — N3946 Mixed incontinence: Secondary | ICD-10-CM

## 2020-03-01 DIAGNOSIS — Z789 Other specified health status: Secondary | ICD-10-CM

## 2020-03-01 DIAGNOSIS — E1121 Type 2 diabetes mellitus with diabetic nephropathy: Secondary | ICD-10-CM

## 2020-03-01 DIAGNOSIS — E118 Type 2 diabetes mellitus with unspecified complications: Secondary | ICD-10-CM

## 2020-03-01 DIAGNOSIS — I10 Essential (primary) hypertension: Secondary | ICD-10-CM

## 2020-03-01 MED ORDER — LANTUS SOLOSTAR 100 UNIT/ML ~~LOC~~ SOPN: PEN_INJECTOR | SUBCUTANEOUS | 3 refills | Status: DC

## 2020-03-01 NOTE — Patient Instructions (Signed)
Ms. Ent,   It was great talking with you today!  Increase Lantus to 18 units daily.   I am sending the FreeStyle [place] 2 order to [facility]. If you haven't heard anything from me or them in 2 weeks, call them to follow up on the order at [phone].   As always, call me with any questions or concerns!  [name], PharmD [phone]  Visit Information  Patient Care Plan: Medication Management    Problem Identified: Diabetes     Long-Range Goal: Disease Progression Prevention   Recent Progress: On track  Priority: High  Note:   Current Barriers:  . Unable to achieve control of diabetes  . Does not adhere to prescribed medication regimen  Pharmacist Clinical Goal(s):  Marland Kitchen Over the next 90 days, patient will improve control of diabetes as evidenced by improvement in A1c. . Over the next 90 days, patient will adhere to prescribed medication regimen.  Interventions: . Inter-disciplinary care team collaboration (see longitudinal plan of care) . Comprehensive medication review performed; medication list updated in electronic medical record  Diabetes: . Uncontrolled/controlled; current treatment: Lantus 14 units daily- self d/c metformin after [employer] telephonic CDE noted to her that metformin was probably causing diarrhea. Notes that her diarrhea has improved after stopping metformin.  o Hx Victoza, but was discontinued after an episode of gallstone pancreatitis (recommended by GI providers while hospitalized), will avoid GLP1 moving forward o Hx metformin XR, patient d/c after diarrhea o Unsure if patient would tolerate SGLT2 d/t baseline urinary incontinence  . Current glucose readings: Checking 2-4 times daily; Fasting: 200-210; 2 hour post prandial: 210-220 after different meals; very concerned about the risk of hypoglycemia.  . Discussed use of CGM to aid in medication monitoring, dietary monitoring, and hypoglycemic notification given need to increase insulin  doses and inability to use non-insulin agents at this time d/t hx intolerances and comorbid conditions. Patient interested in [place] 2. Will place order at this time.  . Removing metformin from medication list given patient reported improvement with self discontinuation . Increase Lantus to 14 units daily. Discussed duration of action of basal insulin.   Hypertension: . Controlled; current treatment: amlodipine 10 mg daily, furosemide 20 mg daily . Recommended to continue current treatment regimen  Hyperlipidemia: . Uncontrolled; current treatment: ezetimibe 10 mg daily;  Marland Kitchen Medications previously tried: Reports hx muscle aches and GI upset with atorvastatin, rosuvastatin 5 mg daily. Refuses retrial.  . Recommended to continue current regimen. Could consider option like Nexlizet moving forward to add bempedoic acid.  Will weigh risk vs benefit given patient's age, comordbities   IBS: . Uncontrolled per patient report; current regimen: dicyclomine 20 mg QID PRN, as since reduced to 1 tab prior to meals and has had improvement in diarrhea w/ dietary modification and self d/c of mteformin . Continue current regimen. Patient not interested in GI referral at this time  GERD: . Controlled per patient report, current treatment: pantoprazole 40 mg BID PRN GERD . 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 Controlled, current treatment regimen: celecoxib 100 mg BID, acetaminophen PRN, tizanidine 4 mg TID PRN - patient reports she occasionally will take 4 doses in a day, denies sedation with this medication . Encouraged to continue current regimen with moderation with tizanidine use   Over the Counter Medications: . Current regimen: cetirizine 10 mg daily for allergies, Vitamin D 1000 units daily . No interactions noted. Recommend to continue  current regimen  OAB/Urinary Incontience: . Moderately well controlled; taking tolterodine LA  4 mg daily . Recommended to continue current regimen at this time  Patient Goals/Self-Care Activities . Over the next 90 days, patient will:  - take medications as prescribed check blood glucose TID using CGM, document, and provide at future appointments  Follow Up Plan: Telephone follow up appointment with care management team member scheduled for: ~3 weeks     The patient verbalized understanding of instructions, educational materials, and care plan provided today and agreed to receive a mailed copy of patient instructions, educational materials, and care plan.    Plan: Telephone follow up appointment with care management team member scheduled for:~ 3 weeks  [name], PharmD, [place], CPP Clinical Pharmacist [facility] [employer] [phone]

## 2020-03-01 NOTE — Chronic Care Management (AMB) (Cosign Needed)
Chronic Care Management   Pharmacy Note  03/01/2020 Name: Bonnie Hinton MRN: [id number] DOB: 03-02-1941   Subjective:  Bonnie Hinton is a 79 y.o. year old female who is a primary care patient of Tullo, Mar Daring, MD. The CCM team was consulted for assistance with chronic disease management and care coordination needs.    Engaged with patient by telephone for follow up visit in response to provider referral for pharmacy case management and/or care coordination services.   Consent to Services:  Bonnie Hinton was given information about Chronic Care Management services, agreed to services, and gave verbal consent prior to initiation of services on 01/11/20. Please see initial visit note for detailed documentation.   SDOH (Social Determinants of Health) assessments and interventions performed:  SDOH Interventions     Most Recent Value  SDOH Interventions  Financial Strain Interventions Intervention Not Indicated  Stress Interventions Other (Comment)  [daughter is expecting a baby any day now,  provided empathetic listening]       Objective:  Lab Results  Component Value Date   CREATININE 1.01 12/31/2019   CREATININE 0.85 09/22/2018   CREATININE 0.84 03/09/2018    Lab Results  Component Value Date   HGBA1C 10.2 Repeated and verified X2. (H) 12/31/2019       Component Value Date/Time   CHOL 267 (H) 12/31/2019 0913   TRIG 234.0 (H) 12/31/2019 0913   HDL 48.70 12/31/2019 0913   CHOLHDL 5 12/31/2019 0913   VLDL 46.8 (H) 12/31/2019 0913   LDLCALC 210 (H) 12/25/2016 1005   LDLDIRECT 181.0 12/31/2019 0913    Clinical ASCVD: No  The 10-year ASCVD risk score Denman George DC Jr., et al., 2013) is: 61.5%   Values used to calculate the score:     Age: 26 years     Sex: Female     Is Non-Hispanic African American: No     Diabetic: Yes     Tobacco smoker: No     Systolic Blood Pressure: 150 mmHg     Is BP treated: Yes     HDL Cholesterol: 48.7 mg/dL     Total Cholesterol:  267 mg/dL    Other: ([id number] if Afib, PHQ9 if depression, MMRC or CAT for COPD, ACT)  BP Readings from Last 3 Encounters:  12/31/19 (!) 150/78  12/28/19 (!) 147/77  09/11/18 (!) 146/71    Assessment/Interventions: Review of patient past medical history, allergies, medications, health status, including review of consultants reports, laboratory and other test data, was performed as part of comprehensive evaluation and provision of chronic care management services.   Allergies  Allergen Reactions  . Erythromycin Rash  . Fentanyl Other (See Comments)    Duragesic-25 Duragesic-25  . Iodinated Diagnostic Agents Swelling and Rash  . Atorvastatin     "stomach problems"  . Metformin And Related Other (See Comments)    Diarrhea even w/ XR metformin  . Victoza [Liraglutide]     Suspected to be the cause of gallstone pancreatitis June 2018  . Latex Itching and Rash  . Lisinopril Other (See Comments)    Hyperkalemia   . Other Rash    Paper tape, possibly adhesive tape PAPER TAPE     Medications Reviewed Today    Reviewed by Lourena Simmonds, RPH-CPP (Pharmacist) on 03/01/20 at 1109  Med List Status: <None>  Medication Order Taking? Sig Documenting Provider Last Dose Status Informant  acetaminophen (TYLENOL) 325 MG tablet [id number]  Take 2 tablets (650 mg total) by mouth every 6 (  six) hours as needed for mild pain (or Fever >/= 101). Shepperson, Kirstin, PA-C  Active Self  amLODipine (NORVASC) 10 MG tablet [id number] Yes TAKE 1 TABLET DAILY Sherlene Shams, MD Taking Active   amoxicillin (AMOXIL) 500 MG capsule [id number] No TAKE 4 CAPSULES BY MOUTH 1 HOUR PRIOR TO DENTAL PROCEDURE  Patient not taking: Reported on 01/11/2020   Sherlene Shams, MD Not Taking Active   celecoxib (CELEBREX) 100 MG capsule [id number] Yes TAKE 1 CAPSULE TWICE A DAY Sherlene Shams, MD Taking Active   cetirizine (ZYRTEC) 10 MG tablet [id number] Yes Take 10 mg by mouth daily. [provider] Taking  Active   Cholecalciferol (VITAMIN D3) 2000 units TABS [id number] Yes Take 1 tablet by mouth daily. [provider] Taking Active   cycloSPORINE (RESTASIS) 0.05 % ophthalmic emulsion [id number] Yes Place 1 drop into both eyes 2 (two) times daily. [provider] Taking Active Self  dicyclomine (BENTYL) 20 MG tablet [id number] Yes TAKE 1 TABLET FOUR TIMES A DAY BEFORE MEALS AND AT BEDTIME Sherlene Shams, MD Taking Active            Med Note Edwin Cap Mar 01, 2020 11:09 AM)    ezetimibe (ZETIA) 10 MG tablet [id number] Yes TAKE 1 TABLET DAILY Sherlene Shams, MD Taking Active   furosemide (LASIX) 20 MG tablet [id number] Yes Take 1 tablet (20 mg total) by mouth daily. Sherlene Shams, MD Taking Active   glucose blood [facility] VERIO) test strip [id number] Yes USE TO TEST BLOOD SUGAR ONCE DAILY Sherlene Shams, MD Taking Active   insulin glargine (LANTUS SOLOSTAR) 100 UNIT/ML Solostar Pen [id number] Yes Inject 14 units daily. Titrate as instructed. Max daily dose 30 units. Sherlene Shams, MD Taking Active   Insulin Pen Needle (PEN NEEDLES) 32G X 5 MM MISC [id number] Yes 1 pen by Does not apply route daily. Sherlene Shams, MD Taking Active   Lancets 30G MISC [id number] Yes Use to check blood sugars once daily. Sherlene Shams, MD Taking Active   levothyroxine (SYNTHROID) 137 MCG tablet [id number] Yes Take 1 tablet (137 mcg total) by mouth daily before breakfast. Sherlene Shams, MD Taking Active   LUMIGAN 0.01 % SOLN [id number] Yes Place 1-2 drops into both eyes at bedtime. [provider] Taking Active Self           Med Note Derrell Lolling, JESSICA R   Fri Apr 05, 2016  8:56 PM)    nystatin cream (MYCOSTATIN) [id number] Yes APPLY TOPICALLY TWICE A DAY Sherlene Shams, MD Taking Active   pantoprazole (PROTONIX) 40 MG tablet [id number] Yes TAKE 1 TABLET DAILY Sherlene Shams, MD Taking Active   tiZANidine (ZANAFLEX) 4 MG capsule [id number] Yes TAKE 1 CAPSULE THREE TIMES A DAY AS  NEEDED FOR MUSCLE SPASMS Sherlene Shams, MD Taking Active   tolterodine (DETROL LA) 4 MG 24 hr capsule [id number] Yes TAKE 1 CAPSULE TWICE A DAY Sherlene Shams, MD Taking Active           Patient Active Problem List   Diagnosis Date Noted  . Statin intolerance 03/05/2018  . 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 Candidiasis of skin 06/28/2017  . IBS (irritable bowel syndrome) 06/28/2017  . Screening for colon cancer 12/28/2016  . Idiopathic acute pancreatitis without infection or necrosis   . Diabetes mellitus with complication (HCC)   . Dizziness 05/24/2016  . Anxiety, generalized 02/20/2016  . Arthritis   . Acquired  hypothyroidism   . History of CVA (cerebrovascular accident)   . Status post bilateral hip replacements   . Status post right knee replacement   . Degenerative lumbar spinal stenosis   . Essential hypertension, benign 07/25/2015  . Preoperative clearance 07/25/2015  . Hyperlipidemia 07/25/2015  . Mixed stress and urge urinary incontinence 04/26/2015  . Diabetic nephropathy associated with type 2 diabetes mellitus (HCC) 01/24/2015  . Morbid obesity (HCC) 01/24/2015  . Vitamin D deficiency 01/24/2015  . 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 Patient affirms current coverage meets needs.   Patient Care Plan: Medication Management    Problem Identified: Diabetes     Long-Range Goal: Disease Progression Prevention   Recent Progress: On track  Priority: High  Note:   Current Barriers:  . Unable to achieve control of diabetes  . Does not adhere to prescribed medication regimen  Pharmacist Clinical Goal(s):  Marland Kitchen. Over the next 90 days, patient will improve control of diabetes as evidenced by improvement in A1c. . Over the next 90 days, patient will adhere to prescribed medication regimen.  Interventions: . Inter-disciplinary care team collaboration (see longitudinal plan of  care) . Comprehensive medication review performed; medication list updated in electronic medical record  Diabetes: . Uncontrolled/controlled; current treatment: Lantus 14 units daily- self d/c metformin after [employer] telephonic CDE noted to her that metformin was probably causing diarrhea. Notes that her diarrhea has improved after stopping metformin.  o Hx Victoza, but was discontinued after an episode of gallstone pancreatitis (recommended by GI providers while hospitalized), will avoid GLP1 moving forward o Hx metformin XR, patient d/c after diarrhea o Unsure if patient would tolerate SGLT2 d/t baseline urinary incontinence  . Current glucose readings: Checking 2-4 times daily; Fasting: 200-210; 2 hour post prandial: 210-220 after different meals; very concerned about the risk of hypoglycemia.  . Discussed use of CGM to aid in medication monitoring, dietary monitoring, and hypoglycemic notification given need to increase insulin doses and inability to use non-insulin agents at this time d/t hx intolerances and comorbid conditions. Patient interested in [place] 2. Will place order at this time.  . Removing metformin from medication list given patient reported improvement with self discontinuation . Increase Lantus to 14 units daily. Discussed duration of action of basal insulin.   Hypertension: . Controlled; current treatment: amlodipine 10 mg daily, furosemide 20 mg daily . Recommended to continue current treatment regimen  Hyperlipidemia: . Uncontrolled; current treatment: ezetimibe 10 mg daily;  Marland Kitchen. Medications previously tried: Reports hx muscle aches and GI upset with atorvastatin, rosuvastatin 5 mg daily. Refuses retrial.  . Recommended to continue current regimen. Could consider option like Nexlizet moving forward to add bempedoic acid.  Will weigh risk vs benefit given patient's age, comordbities   IBS: . Uncontrolled per patient report; current regimen: dicyclomine 20 mg QID PRN, as since  reduced to 1 tab prior to meals and has had improvement in diarrhea w/ dietary modification and self d/c of mteformin . Continue current regimen. Patient not interested in GI referral at this time  GERD: . Controlled per patient report, current treatment: pantoprazole 40 mg BID PRN GERD . 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 Controlled, current treatment regimen: celecoxib 100 mg BID, acetaminophen PRN, tizanidine 4 mg TID PRN - patient reports she occasionally will take 4 doses in a day, denies sedation with this medication . Encouraged to  continue current regimen with moderation with tizanidine use   Over the Counter Medications: . Current regimen: cetirizine 10 mg daily for allergies, Vitamin D 1000 units daily . No interactions noted. Recommend to continue current regimen  OAB/Urinary Incontience: . Moderately well controlled; taking tolterodine LA 4 mg daily . Recommended to continue current regimen at this time  Patient Goals/Self-Care Activities . Over the next 90 days, patient will:  - take medications as prescribed check blood glucose TID using CGM, document, and provide at future appointments  Follow Up Plan: Telephone follow up appointment with care management team member scheduled for: ~3 weeks      Plan: Telephone follow up appointment with care management team member scheduled for:~ 3 weeks  [name], PharmD, [place], CPP Clinical Pharmacist [facility] [employer] [phone]

## 2020-03-13 ENCOUNTER — Ambulatory Visit: Payer: Medicare Other | Admitting: Podiatry

## 2020-03-13 ENCOUNTER — Encounter: Payer: Self-pay | Admitting: Podiatry

## 2020-03-13 ENCOUNTER — Other Ambulatory Visit: Payer: Self-pay

## 2020-03-13 DIAGNOSIS — B351 Tinea unguium: Secondary | ICD-10-CM | POA: Diagnosis not present

## 2020-03-13 DIAGNOSIS — M79676 Pain in unspecified toe(s): Secondary | ICD-10-CM

## 2020-03-13 DIAGNOSIS — E1142 Type 2 diabetes mellitus with diabetic polyneuropathy: Secondary | ICD-10-CM

## 2020-03-13 NOTE — Progress Notes (Signed)
This patient returns to my office for at risk foot care.  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  This patient is unable to cut nails herself since the patient cannot reach her  nails.These nails are painful walking and wearing shoes.  This patient presents for at risk foot care today.  General Appearance  Alert, conversant and in no acute stress.  Vascular  Dorsalis pedis and posterior tibial  pulses are palpable  bilaterally.  Capillary return is within normal limits  bilaterally. Temperature is within normal limits  bilaterally.  Neurologic  Senn-Weinstein monofilament wire test within normal limits  bilaterally. Muscle power within normal limits bilaterally.  Nails Thick disfigured discolored nails with subungual debris  from hallux to fifth toes bilaterally. No evidence of bacterial infection or drainage bilaterally.  Orthopedic  No limitations of motion  feet .  No crepitus or effusions noted.  Contracted digits  B/L.  Severe PTTD left foot.   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  Hammer toes 2,3 right foot.  Skin  normotropic skin with no porokeratosis noted bilaterally.  No signs of infections or ulcers noted.     Onychomycosis  Pain in right toes  Pain in left toes  Consent was obtained for treatment procedures.   Mechanical debridement of nails 1-5  bilaterally performed with a nail nipper.  Filed with dremel without incident. No infection or ulcer.     Return office visit 3 months         Told patient to return for periodic foot care and evaluation due to potential at risk complications.   Helane Gunther DPM

## 2020-03-14 ENCOUNTER — Ambulatory Visit: Payer: Medicare Other | Admitting: Pharmacist

## 2020-03-14 DIAGNOSIS — I10 Essential (primary) hypertension: Secondary | ICD-10-CM

## 2020-03-14 DIAGNOSIS — E118 Type 2 diabetes mellitus with unspecified complications: Secondary | ICD-10-CM

## 2020-03-14 DIAGNOSIS — Z789 Other specified health status: Secondary | ICD-10-CM

## 2020-03-14 NOTE — Chronic Care Management (AMB) (Signed)
Chronic Care Management   Pharmacy Note  03/14/2020 Name: Bonnie Hinton MRN: [id number] DOB: 01-22-1941  Subjective:  Bonnie Hinton is a 79 y.o. year old female who is a primary care patient of Tullo, Mar Daring, MD. The CCM team was consulted for assistance with chronic disease management and care coordination needs.    Engaged with patient by telephone for follow up on CGM order  in response to provider referral for pharmacy case management and/or care coordination services.   Consent to Services:  Bonnie Hinton was given information about Chronic Care Management services, agreed to services, and gave verbal consent prior to initiation of services on 01/11/20. Please see initial visit note for detailed documentation.   Objective:  Lab Results  Component Value Date   CREATININE 1.01 12/31/2019   CREATININE 0.85 09/22/2018   CREATININE 0.84 03/09/2018    Lab Results  Component Value Date   HGBA1C 10.2 Repeated and verified X2. (H) 12/31/2019       Component Value Date/Time   CHOL 267 (H) 12/31/2019 0913   TRIG 234.0 (H) 12/31/2019 0913   HDL 48.70 12/31/2019 0913   CHOLHDL 5 12/31/2019 0913   VLDL 46.8 (H) 12/31/2019 0913   LDLCALC 210 (H) 12/25/2016 1005   LDLDIRECT 181.0 12/31/2019 0913      BP Readings from Last 3 Encounters:  12/31/19 (!) 150/78  12/28/19 (!) 147/77  09/11/18 (!) 146/71    Assessment/Interventions: Review of patient past medical history, allergies, medications, health status, including review of consultants reports, laboratory and other test data, was performed as part of comprehensive evaluation and provision of chronic care management services.   SDOH (Social Determinants of Health) assessments and interventions performed:    CCM Care Plan  Allergies  Allergen Reactions  . Erythromycin Rash  . Fentanyl Other (See Comments)    Duragesic-25 Duragesic-25  . Iodinated Diagnostic Agents Swelling and Rash  . Atorvastatin      "stomach problems"  . Metformin And Related Other (See Comments)    Diarrhea even w/ XR metformin  . Victoza [Liraglutide]     Suspected to be the cause of gallstone pancreatitis June 2018  . Latex Itching and Rash  . Lisinopril Other (See Comments)    Hyperkalemia   . Other Rash    Paper tape, possibly adhesive tape PAPER TAPE     Medications Reviewed Today    Reviewed by Helane Gunther, DPM (Physician) on 03/13/20 at [phone]  Med List Status: <None>  Medication Order Taking? Sig Documenting Provider Last Dose Status Informant  acetaminophen (TYLENOL) 325 MG tablet [id number] Yes Take 2 tablets (650 mg total) by mouth every 6 (six) hours as needed for mild pain (or Fever >/= 101). Shepperson, Kirstin, PA-C Taking Active Self  amLODipine (NORVASC) 10 MG tablet [id number] Yes TAKE 1 TABLET DAILY Sherlene Shams, MD Taking Active   amoxicillin (AMOXIL) 500 MG capsule [id number] Yes TAKE 4 CAPSULES BY MOUTH 1 HOUR PRIOR TO DENTAL PROCEDURE Sherlene Shams, MD Taking Active   B-D UF III MINI PEN NEEDLES 31G X 5 MM MISC [id number]  SMARTSIG:1 SUB-Q Every Night [provider]  Active   celecoxib (CELEBREX) 100 MG capsule [id number] Yes TAKE 1 CAPSULE TWICE A DAY Sherlene Shams, MD Taking Active   cetirizine (ZYRTEC) 10 MG tablet [id number] Yes Take 10 mg by mouth daily. [provider] Taking Active   Cholecalciferol (VITAMIN D3) 2000 units TABS [id number] Yes Take 1 tablet by mouth daily. [provider] Taking Active   cycloSPORINE (RESTASIS) 0.05 % ophthalmic emulsion [id number] Yes Place 1 drop into both eyes 2 (two) times daily. [provider] Taking Active Self  dicyclomine (BENTYL) 20 MG tablet [id number] Yes TAKE 1 TABLET FOUR TIMES A DAY BEFORE MEALS AND AT BEDTIME Sherlene Shams, MD Taking Active            Med Note Edwin Cap Mar 01, 2020 11:09 AM)    ezetimibe (ZETIA) 10 MG tablet [id number] Yes TAKE 1 TABLET DAILY Sherlene Shams, MD Taking  Active   furosemide (LASIX) 20 MG tablet [id number] Yes Take 1 tablet (20 mg total) by mouth daily. Sherlene Shams, MD Taking Active   glucose blood [facility] VERIO) test strip [id number] Yes USE TO TEST BLOOD SUGAR ONCE DAILY Sherlene Shams, MD Taking Active   insulin glargine (LANTUS SOLOSTAR) 100 UNIT/ML Solostar Pen [id number] Yes Inject 18 units daily. Titrate as instructed. Max daily dose 30 units. Sherlene Shams, MD Taking Active   Lancets 30G MISC [id number] Yes Use to check blood sugars once daily. Sherlene Shams, MD Taking Active   levothyroxine (SYNTHROID) 137 MCG tablet [id number] Yes Take 1 tablet (137 mcg total) by mouth daily before breakfast. Sherlene Shams, MD Taking Active   LUMIGAN 0.01 % SOLN [id number] Yes Place 1-2 drops into both eyes at bedtime. [provider] Taking Active Self           Med Note Derrell Lolling, JESSICA R   Fri Apr 05, 2016  8:56 PM)    nystatin cream (MYCOSTATIN) [id number] Yes APPLY TOPICALLY TWICE A DAY Sherlene Shams, MD Taking Active   pantoprazole (PROTONIX) 40 MG tablet [id number] Yes TAKE 1 TABLET DAILY Sherlene Shams, MD Taking Active   tiZANidine (ZANAFLEX) 4 MG capsule [id number] Yes TAKE 1 CAPSULE THREE TIMES A DAY AS NEEDED FOR MUSCLE SPASMS Sherlene Shams, MD Taking Active   tolterodine (DETROL LA) 4 MG 24 hr capsule [id number] Yes TAKE 1 CAPSULE TWICE A DAY Sherlene Shams, MD Taking Active           Patient Active Problem List   Diagnosis Date Noted  . Statin intolerance 03/05/2018  . 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 Candidiasis of skin 06/28/2017  . IBS (irritable bowel syndrome) 06/28/2017  . Screening for colon cancer 12/28/2016  . Idiopathic acute pancreatitis without infection or necrosis   . Diabetes mellitus with complication (HCC)   . Dizziness 05/24/2016  . Anxiety, generalized 02/20/2016  . Arthritis   . Acquired hypothyroidism   . History of CVA (cerebrovascular accident)   . Status post bilateral hip  replacements   . Status post right knee replacement   . Degenerative lumbar spinal stenosis   . Essential hypertension, benign 07/25/2015  . Preoperative clearance 07/25/2015  . Hyperlipidemia 07/25/2015  . Mixed stress and urge urinary incontinence 04/26/2015  . Diabetic nephropathy associated with type 2 diabetes mellitus (HCC) 01/24/2015  . Morbid obesity (HCC) 01/24/2015  . Vitamin D deficiency 01/24/2015  . 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 Unable to achieve control of diabetes  . Does not  adhere to prescribed medication regimen  Pharmacist Clinical Goal(s):  Marland Kitchen Over the next 90 days, patient will improve control of diabetes as evidenced by improvement in A1c. . Over the next 90 days, patient will adhere to prescribed medication regimen.  Interventions: . Inter-disciplinary care team collaboration (see longitudinal plan of care) . Comprehensive medication review performed; medication list updated in electronic medical record  Diabetes: . Uncontrolled/controlled; current treatment: Lantus 14 units daily- self d/c metformin after [employer] telephonic CDE noted to her that metformin was probably causing diarrhea. Notes that her diarrhea has improved after stopping metformin.  o Hx Victoza, but was discontinued after an episode of gallstone pancreatitis (recommended by GI providers while hospitalized), will avoid GLP1 moving forward o Hx metformin XR, patient d/c after diarrhea o Unsure if patient would tolerate SGLT2 d/t baseline urinary incontinence  . Contacted [facility] to follow up on order. They note that they are not contracted, but that  [facility] is contracted. Faxed order to [facility]. Will f/u in 1-2 weeks  Hypertension: . Controlled; current treatment: amlodipine 10 mg daily, furosemide 20 mg daily . Recommended to continue current treatment regimen  Hyperlipidemia: . Uncontrolled; current treatment: ezetimibe 10 mg daily;  Marland Kitchen Medications previously tried: Reports hx muscle aches and GI upset with atorvastatin, rosuvastatin 5 mg daily. Refuses retrial.  . Recommended to continue current regimen. Could consider option like Nexlizet moving forward to add bempedoic acid.  Will weigh risk vs benefit given patient's age, comorbidities   IBS: . Uncontrolled per patient report; current regimen: dicyclomine 20 mg QID PRN, as since reduced to 1 tab prior to meals and has had improvement in diarrhea w/ dietary modification and self d/c of metformin . Continue current regimen. Patient not interested in GI referral at this time  GERD: . Controlled per patient report, current treatment: pantoprazole 40 mg BID PRN GERD . 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 Controlled, current treatment regimen: celecoxib 100 mg BID, acetaminophen PRN, tizanidine 4 mg TID PRN (sometimes 3-4 doses daily) . Encouraged to continue current regimen with moderation with tizanidine use   Over the Counter Medications: . Current regimen: cetirizine 10 mg daily for allergies, Vitamin D 1000 units daily . No interactions noted. Recommend to continue current regimen  OAB/Urinary Incontience: . Moderately well controlled; taking tolterodine LA 4 mg daily . Recommended to continue current regimen at this time  Patient Goals/Self-Care Activities . Over the next 90 days, patient will:  - take medications as prescribed check blood glucose TID using CGM, document, and provide at future appointments  Follow Up Plan: Telephone follow up appointment with care management team member  scheduled for: ~1 week as previously scheduled      Medication Assistance: None required. Patient affirms current coverage meets needs.   Plan: Telephone follow up appointment with care management team member scheduled for: ~ 1 week as previously scheduled  [name], PharmD, [place], CPP Clinical Pharmacist [facility] [employer] [facility] [phone]

## 2020-03-14 NOTE — Patient Instructions (Signed)
Visit Information  Patient Care Plan: Medication Management    Problem Identified: Diabetes     Long-Range Goal: Disease Progression Prevention   This Visit's Progress: On track  Recent Progress: On track  Priority: High  Note:   Current Barriers:  . Unable to achieve control of diabetes  . Does not adhere to prescribed medication regimen  Pharmacist Clinical Goal(s):  Marland Kitchen Over the next 90 days, patient will improve control of diabetes as evidenced by improvement in A1c. . Over the next 90 days, patient will adhere to prescribed medication regimen.  Interventions: . Inter-disciplinary care team collaboration (see longitudinal plan of care) . Comprehensive medication review performed; medication list updated in electronic medical record  Diabetes: . Uncontrolled/controlled; current treatment: Lantus 14 units daily- self d/c metformin after [employer] telephonic CDE noted to her that metformin was probably causing diarrhea. Notes that her diarrhea has improved after stopping metformin.  o Hx Victoza, but was discontinued after an episode of gallstone pancreatitis (recommended by GI providers while hospitalized), will avoid GLP1 moving forward o Hx metformin XR, patient d/c after diarrhea o Unsure if patient would tolerate SGLT2 d/t baseline urinary incontinence  . Contacted [facility] to follow up on order. They note that they are not contracted, but that [facility] is contracted. Faxed order to [facility]. Will f/u in 1-2 weeks  Hypertension: . Controlled; current treatment: amlodipine 10 mg daily, furosemide 20 mg daily . Recommended to continue current treatment regimen  Hyperlipidemia: . Uncontrolled; current treatment: ezetimibe 10 mg daily;  Marland Kitchen Medications previously tried: Reports hx muscle aches and GI upset with atorvastatin, rosuvastatin 5 mg daily. Refuses retrial.  . Recommended to continue current regimen. Could consider option like Nexlizet moving forward to  add bempedoic acid.  Will weigh risk vs benefit given patient's age, comorbidities   IBS: . Uncontrolled per patient report; current regimen: dicyclomine 20 mg QID PRN, as since reduced to 1 tab prior to meals and has had improvement in diarrhea w/ dietary modification and self d/c of metformin . Continue current regimen. Patient not interested in GI referral at this time  GERD: . Controlled per patient report, current treatment: pantoprazole 40 mg BID PRN GERD . 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 Controlled, current treatment regimen: celecoxib 100 mg BID, acetaminophen PRN, tizanidine 4 mg TID PRN (sometimes 3-4 doses daily) . Encouraged to continue current regimen with moderation with tizanidine use   Over the Counter Medications: . Current regimen: cetirizine 10 mg daily for allergies, Vitamin D 1000 units daily . No interactions noted. Recommend to continue current regimen  OAB/Urinary Incontience: . Moderately well controlled; taking tolterodine LA 4 mg daily . Recommended to continue current regimen at this time  Patient Goals/Self-Care Activities . Over the next 90 days, patient will:  - take medications as prescribed check blood glucose TID using CGM, document, and provide at future appointments  Follow Up Plan: Telephone follow up appointment with care management team member scheduled for: ~1 week as previously scheduled       The patient verbalized understanding of instructions, educational materials, and care plan provided today and declined offer to receive copy of patient instructions, educational materials, and care plan.   Plan: Telephone follow up appointment with care management team member scheduled for: ~ 1 week as previously scheduled  [name], PharmD, [place], CPP Clinical Pharmacist [employer] [employer] [phone]

## 2020-03-21 ENCOUNTER — Ambulatory Visit: Payer: Medicare Other | Admitting: Pharmacist

## 2020-03-21 DIAGNOSIS — E782 Mixed hyperlipidemia: Secondary | ICD-10-CM

## 2020-03-21 DIAGNOSIS — I10 Essential (primary) hypertension: Secondary | ICD-10-CM

## 2020-03-21 DIAGNOSIS — E118 Type 2 diabetes mellitus with unspecified complications: Secondary | ICD-10-CM

## 2020-03-21 DIAGNOSIS — Z789 Other specified health status: Secondary | ICD-10-CM

## 2020-03-21 MED ORDER — LANTUS SOLOSTAR 100 UNIT/ML ~~LOC~~ SOPN: PEN_INJECTOR | SUBCUTANEOUS | 3 refills | Status: DC

## 2020-03-21 NOTE — Chronic Care Management (AMB) (Signed)
Chronic Care Management   Pharmacy Note  03/21/2020 Name: Bonnie Hinton MRN: [id number] DOB: 28-Sep-1940  Subjective:  Bonnie Hinton is a 79 y.o. year old female who is a primary care patient of Tullo, Mar Daring, MD. The CCM team was consulted for assistance with chronic disease management and care coordination needs.    Engaged with patient by telephone for follow up visit in response to provider referral for pharmacy case management and/or care coordination services.   Consent to Services:  Bonnie Hinton was given information about Chronic Care Management services, agreed to services, and gave verbal consent prior to initiation of services on 01/11/20. Please see initial visit note for detailed documentation.   Objective:  Lab Results  Component Value Date   CREATININE 1.01 12/31/2019   CREATININE 0.85 09/22/2018   CREATININE 0.84 03/09/2018    Lab Results  Component Value Date   HGBA1C 10.2 Repeated and verified X2. (H) 12/31/2019       Component Value Date/Time   CHOL 267 (H) 12/31/2019 0913   TRIG 234.0 (H) 12/31/2019 0913   HDL 48.70 12/31/2019 0913   CHOLHDL 5 12/31/2019 0913   VLDL 46.8 (H) 12/31/2019 0913   LDLCALC 210 (H) 12/25/2016 1005   LDLDIRECT 181.0 12/31/2019 0913    BP Readings from Last 3 Encounters:  12/31/19 (!) 150/78  12/28/19 (!) 147/77  09/11/18 (!) 146/71    Assessment/Interventions: Review of patient past medical history, allergies, medications, health status, including review of consultants reports, laboratory and other test data, was performed as part of comprehensive evaluation and provision of chronic care management services.   SDOH (Social Determinants of Health) assessments and interventions performed:    CCM Care Plan  Allergies  Allergen Reactions  . Erythromycin Rash  . Fentanyl Other (See Comments)    Duragesic-25 Duragesic-25  . Iodinated Diagnostic Agents Swelling and Rash  . Atorvastatin     "stomach  problems"  . Metformin And Related Other (See Comments)    Diarrhea even w/ XR metformin  . Victoza [Liraglutide]     Suspected to be the cause of gallstone pancreatitis June 2018  . Latex Itching and Rash  . Lisinopril Other (See Comments)    Hyperkalemia   . Other Rash    Paper tape, possibly adhesive tape PAPER TAPE     Medications Reviewed Today    Reviewed by Lourena Simmonds, RPH-CPP (Pharmacist) on 03/21/20 at 1319  Med List Status: <None>  Medication Order Taking? Sig Documenting Provider Last Dose Status Informant  acetaminophen (TYLENOL) 325 MG tablet [id number] Yes Take 2 tablets (650 mg total) by mouth every 6 (six) hours as needed for mild pain (or Fever >/= 101). Shepperson, Kirstin, PA-C Taking Active Self  amLODipine (NORVASC) 10 MG tablet [id number] Yes TAKE 1 TABLET DAILY Sherlene Shams, MD Taking Active   amoxicillin (AMOXIL) 500 MG capsule [id number] No TAKE 4 CAPSULES BY MOUTH 1 HOUR PRIOR TO DENTAL PROCEDURE  Patient not taking: Reported on 03/21/2020   Sherlene Shams, MD Not Taking Active   B-D UF III MINI PEN NEEDLES 31G X 5 MM MISC [id number] Yes SMARTSIG:1 SUB-Q Every Night [provider] Taking Active   celecoxib (CELEBREX) 100 MG capsule [id number] Yes TAKE 1 CAPSULE TWICE A DAY Sherlene Shams, MD Taking Active   cetirizine (ZYRTEC) 10 MG tablet [id number] Yes Take 10 mg by mouth daily. [provider] Taking Active   Cholecalciferol (VITAMIN D3) 2000 units TABS [id number] Yes Take 1  tablet by mouth daily. [provider] Taking Active   cycloSPORINE (RESTASIS) 0.05 % ophthalmic emulsion [id number] Yes Place 1 drop into both eyes 2 (two) times daily. [provider] Taking Active Self  dicyclomine (BENTYL) 20 MG tablet [id number] Yes TAKE 1 TABLET FOUR TIMES A DAY BEFORE MEALS AND AT BEDTIME Sherlene Shams, MD Taking Active            Med Note Edwin Cap Mar 01, 2020 11:09 AM)    ezetimibe (ZETIA) 10 MG tablet  [id number] Yes TAKE 1 TABLET DAILY Sherlene Shams, MD Taking Active   furosemide (LASIX) 20 MG tablet [id number]  Take 1 tablet (20 mg total) by mouth daily. Sherlene Shams, MD  Active   glucose blood [facility] VERIO) test strip [id number]  USE TO TEST BLOOD SUGAR ONCE DAILY Sherlene Shams, MD  Active   insulin glargine (LANTUS SOLOSTAR) 100 UNIT/ML Solostar Pen [id number] Yes Inject 18 units daily. Titrate as instructed. Max daily dose 30 units. Sherlene Shams, MD Taking Active   Lancets 30G MISC [id number] Yes Use to check blood sugars once daily. Sherlene Shams, MD Taking Active   levothyroxine (SYNTHROID) 137 MCG tablet [id number] Yes Take 1 tablet (137 mcg total) by mouth daily before breakfast. Sherlene Shams, MD Taking Active   LUMIGAN 0.01 % SOLN [id number] Yes Place 1-2 drops into both eyes at bedtime. [provider] Taking Active Self           Med Note Derrell Lolling, JESSICA R   Fri Apr 05, 2016  8:56 PM)    nystatin cream (MYCOSTATIN) [id number] Yes APPLY TOPICALLY TWICE A DAY Sherlene Shams, MD Taking Active   pantoprazole (PROTONIX) 40 MG tablet [id number] Yes TAKE 1 TABLET DAILY Sherlene Shams, MD Taking Active   tiZANidine (ZANAFLEX) 4 MG capsule [id number] Yes TAKE 1 CAPSULE THREE TIMES A DAY AS NEEDED FOR MUSCLE SPASMS Sherlene Shams, MD Taking Active   tolterodine (DETROL LA) 4 MG 24 hr capsule [id number] Yes TAKE 1 CAPSULE TWICE A DAY Sherlene Shams, MD Taking Active           Patient Active Problem List   Diagnosis Date Noted  . Statin intolerance 03/05/2018  . 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 Candidiasis of skin 06/28/2017  . IBS (irritable bowel syndrome) 06/28/2017  . Screening for colon cancer 12/28/2016  . Idiopathic acute pancreatitis without infection or necrosis   . Diabetes mellitus with complication (HCC)   . Dizziness 05/24/2016  . Anxiety, generalized 02/20/2016  . Arthritis   . Acquired hypothyroidism   . History of CVA (cerebrovascular  accident)   . Status post bilateral hip replacements   . Status post right knee replacement   . Degenerative lumbar spinal stenosis   . Essential hypertension, benign 07/25/2015  . Preoperative clearance 07/25/2015  . Hyperlipidemia 07/25/2015  . Mixed stress and urge urinary incontinence 04/26/2015  . Diabetic nephropathy associated with type 2 diabetes mellitus (HCC) 01/24/2015  . Morbid obesity (HCC) 01/24/2015  . Vitamin D deficiency 01/24/2015  . 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 Unable to achieve control of diabetes  .  Does not adhere to prescribed medication regimen  Pharmacist Clinical Goal(s):  Marland Kitchen Over the next 90 days, patient will improve control of diabetes as evidenced by improvement in A1c. . Over the next 90 days, patient will adhere to prescribed medication regimen.  Interventions: . 1:1 collaboration with Sherlene Shams, MD regarding development and update of comprehensive plan of care as evidenced by provider attestation and co-signature . Inter-disciplinary care team collaboration (see longitudinal plan of care) . Comprehensive medication review performed; medication list updated in electronic medical record  Diabetes: . Uncontrolled; current treatment: Lantus 18 units daily o Hx Victoza, but was discontinued after an episode of gallstone pancreatitis (recommended by GI providers while hospitalized), will avoid GLP1 moving forward o Hx metformin XR, patient d/c after diarrhea o Unsure if patient would tolerate SGLT2 d/t baseline urinary incontinence  . Current glucose readings: fastings 140-180s recently. Was 164 this morning.  . Denies episodes of  hypoglycemia.  . Current meal patterns: Breakfast: sometimes smoothies; supper: vegetables, fish (buying breaded because she's having a hard time finding non-breaded frozen fish); Drinks: water w/ [employer]. Occasional sparking ice when she wants carbonated beverage.  . Contacted [place] [facility]. With patient's BCBS Medicare, she needs to be injecting insulin 3+ times daily to qualify for CGM. Discussed cash price for [employer] 2.  . Increase Lantus to 20 units daily. Patient verbalizes understanding.  . She does report long standing numbness in her fingers bilaterally. She notes that it sometimes interferes with her sewing. I encouraged her to discuss w/ Dr. Darrick Huntsman at next appointment in ~ 2 weeks  Hypertension: . Controlled; current treatment: amlodipine 10 mg daily, furosemide 20 mg daily . Recommended to continue current treatment regimen  Hyperlipidemia: . Uncontrolled; current treatment: ezetimibe 10 mg daily;  Marland Kitchen Medications previously tried: Reports hx muscle aches and GI upset with atorvastatin, rosuvastatin 5 mg daily. Refuses retrial. At this time, not eligible for PCSK9i or Nexletol as she does not have hx ASCVD.  Marland Kitchen Recommended to continue current regimen.  Continue focus on dietary modifications.   IBS: . Uncontrolled per patient report; current regimen: dicyclomine 20 mg QID PRN . Continue current regimen.   GERD: . Controlled per patient report, current treatment: pantoprazole 40 mg BID PRN GERD . 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 Controlled, current treatment regimen: celecoxib 100 mg BID, acetaminophen PRN, tizanidine 4 mg TID PRN (sometimes 3-4 doses daily) . Encouraged to continue current regimen with moderation with tizanidine use   Over the Counter Medications: . Current regimen: cetirizine 10 mg daily for allergies, Vitamin D 1000 units daily . No interactions noted. Recommend to continue  current regimen  OAB/Urinary Incontience: . Moderately well controlled; taking tolterodine LA 4 mg daily . Recommended to continue current regimen at this time  Patient Goals/Self-Care Activities . Over the next 90 days, patient will:  - take medications as prescribed check blood glucose TID using CGM, document, and provide at future appointments  Follow Up Plan: Telephone follow up appointment with care management team member scheduled for: ~ 6 weeks     Medication Assistance: None required. Patient affirms current coverage meets needs.   Plan: Telephone follow up appointment with care management team member scheduled for: ~ 6 weeks  [name], PharmD, [place], CPP Clinical Pharmacist [employer] [facility] [employer] [phone]

## 2020-03-21 NOTE — Patient Instructions (Signed)
Visit Information  Patient Care Plan: Medication Management    Problem Identified: Diabetes     Long-Range Goal: Disease Progression Prevention   Recent Progress: On track  Priority: High  Note:   Current Barriers:  . Unable to achieve control of diabetes  . Does not adhere to prescribed medication regimen  Pharmacist Clinical Goal(s):  Marland Kitchen Over the next 90 days, patient will improve control of diabetes as evidenced by improvement in A1c. . Over the next 90 days, patient will adhere to prescribed medication regimen.  Interventions: . 1:1 collaboration with Sherlene Shams, MD regarding development and update of comprehensive plan of care as evidenced by provider attestation and co-signature . Inter-disciplinary care team collaboration (see longitudinal plan of care) . Comprehensive medication review performed; medication list updated in electronic medical record  Diabetes: . Uncontrolled; current treatment: Lantus 18 units daily o Hx Victoza, but was discontinued after an episode of gallstone pancreatitis (recommended by GI providers while hospitalized), will avoid GLP1 moving forward o Hx metformin XR, patient d/c after diarrhea o Unsure if patient would tolerate SGLT2 d/t baseline urinary incontinence  . Current glucose readings: fastings 140-180s recently. Was 164 this morning.  . Denies episodes of hypoglycemia.  . Current meal patterns: Breakfast: sometimes smoothies; supper: vegetables, fish (buying breaded because she's having a hard time finding non-breaded frozen fish); Drinks: water w/ [employer]. Occasional sparking ice when she wants carbonated beverage.  . Contacted [place] [facility]. With patient's BCBS Medicare, she needs to be injecting insulin 3+ times daily to qualify for CGM. Discussed cash price for [employer] 2.  . Increase Lantus to 20 units daily. Patient verbalizes understanding.  . She does report long standing numbness in her fingers bilaterally. She notes that it  sometimes interferes with her sewing. I encouraged her to discuss w/ Dr. Darrick Huntsman at next appointment in ~ 2 weeks  Hypertension: . Controlled; current treatment: amlodipine 10 mg daily, furosemide 20 mg daily . Recommended to continue current treatment regimen  Hyperlipidemia: . Uncontrolled; current treatment: ezetimibe 10 mg daily;  Marland Kitchen Medications previously tried: Reports hx muscle aches and GI upset with atorvastatin, rosuvastatin 5 mg daily. Refuses retrial. At this time, not eligible for PCSK9i or Nexletol as she does not have hx ASCVD.  Marland Kitchen Recommended to continue current regimen.  Continue focus on dietary modifications.   IBS: . Uncontrolled per patient report; current regimen: dicyclomine 20 mg QID PRN . Continue current regimen.   GERD: . Controlled per patient report, current treatment: pantoprazole 40 mg BID PRN GERD . 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 Controlled, current treatment regimen: celecoxib 100 mg BID, acetaminophen PRN, tizanidine 4 mg TID PRN (sometimes 3-4 doses daily) . Encouraged to continue current regimen with moderation with tizanidine use   Over the Counter Medications: . Current regimen: cetirizine 10 mg daily for allergies, Vitamin D 1000 units daily . No interactions noted. Recommend to continue current regimen  OAB/Urinary Incontience: . Moderately well controlled; taking tolterodine LA 4 mg daily . Recommended to continue current regimen at this time  Patient Goals/Self-Care Activities . Over the next 90 days, patient will:  - take medications as prescribed check blood glucose TID using CGM, document, and provide at future appointments  Follow Up Plan: Telephone follow up appointment with care management team member scheduled for: ~ 6 weeks     The patient verbalized understanding of instructions, educational materials, and care plan provided today  and declined offer to  receive copy of patient instructions, educational materials, and care plan.   Plan: Telephone follow up appointment with care management team member scheduled for: ~ 6 weeks  [name], PharmD, [place], CPP Clinical Pharmacist [employer] [facility] [employer] [phone]

## 2020-04-05 ENCOUNTER — Ambulatory Visit (INDEPENDENT_AMBULATORY_CARE_PROVIDER_SITE_OTHER): Payer: Medicare Other | Admitting: Internal Medicine

## 2020-04-05 ENCOUNTER — Encounter: Payer: Self-pay | Admitting: Internal Medicine

## 2020-04-05 ENCOUNTER — Other Ambulatory Visit: Payer: Self-pay

## 2020-04-05 ENCOUNTER — Other Ambulatory Visit: Payer: Self-pay | Admitting: Internal Medicine

## 2020-04-05 VITALS — BP 124/66 | HR 75 | Temp 97.8°F | Resp 16 | Ht 65.0 in | Wt 309.0 lb

## 2020-04-05 DIAGNOSIS — M19041 Primary osteoarthritis, right hand: Secondary | ICD-10-CM

## 2020-04-05 DIAGNOSIS — E1121 Type 2 diabetes mellitus with diabetic nephropathy: Secondary | ICD-10-CM | POA: Diagnosis not present

## 2020-04-05 DIAGNOSIS — E118 Type 2 diabetes mellitus with unspecified complications: Secondary | ICD-10-CM | POA: Diagnosis not present

## 2020-04-05 LAB — COMPREHENSIVE METABOLIC PANEL
ALT: 12 U/L (ref 0–35)
AST: 18 U/L (ref 0–37)
Albumin: 4.2 g/dL (ref 3.5–5.2)
Alkaline Phosphatase: 60 U/L (ref 39–117)
BUN: 12 mg/dL (ref 6–23)
CO2: 29 mEq/L (ref 19–32)
Calcium: 9.6 mg/dL (ref 8.4–10.5)
Chloride: 103 mEq/L (ref 96–112)
Creatinine, Ser: 0.77 mg/dL (ref 0.40–1.20)
GFR: 73.46 mL/min (ref >60.00)
Glucose, Bld: 153 mg/dL — ABNORMAL HIGH (ref 70–99)
Potassium: 4.3 mEq/L (ref 3.5–5.1)
Sodium: 138 mEq/L (ref 135–145)
Total Bilirubin: 0.4 mg/dL (ref 0.2–1.2)
Total Protein: 7.3 g/dL (ref 6.0–8.3)

## 2020-04-05 LAB — POCT GLYCOSYLATED HEMOGLOBIN (HGB A1C): Hemoglobin A1C: 8.3 % — AB (ref 4.0–5.6)

## 2020-04-05 LAB — LIPID PANEL
Cholesterol: 227 mg/dL — ABNORMAL HIGH (ref 0–200)
HDL: 46.2 mg/dL (ref >39.00)
NonHDL: 180.92
Total CHOL/HDL Ratio: 5
Triglycerides: 279 mg/dL — ABNORMAL HIGH (ref 0.0–149.0)
VLDL: 55.8 mg/dL — ABNORMAL HIGH (ref 0.0–40.0)

## 2020-04-05 LAB — LDL CHOLESTEROL, DIRECT: Direct LDL: 145 mg/dL

## 2020-04-05 MED ORDER — INSULIN ASPART 100 UNIT/ML ~~LOC~~ SOLN: 5.0000 [IU] | Freq: Three times a day (TID) | SUBCUTANEOUS | 99 refills | Status: DC

## 2020-04-05 NOTE — Assessment & Plan Note (Addendum)
recommend use of tylenol , heat and exercise of muscles

## 2020-04-05 NOTE — Assessment & Plan Note (Addendum)
Previously uncontrolled , with metformin which she stopped recently due to recurrent diarrhea.  A1c confirms improving control with BS  in  200 range on Lantus 20 units.  Advised to increase dose to 22 units and start mealtime insulin. 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 I do not agree with choices of  glimepiride and actos suggested by her insurance company's endocrinologist due to her sedentary nature likely resulting in edema . will refer to Bonnie Hinton  For help in getting her the [facility] monitor .   Lab Results  Component Value Date   HGBA1C 8.3 (A) 04/05/2020

## 2020-04-05 NOTE — Progress Notes (Signed)
Subjective:  Patient ID: Bonnie Hinton, female    DOB: [birth year]  Age: 80 y.o. MRN: [id number]  CC: The primary encounter diagnosis was Diabetes mellitus with complication (HCC). Diagnoses of Diabetic nephropathy associated with type 2 diabetes mellitus (HCC) and OA (osteoarthritis) of finger, right were also pertinent to this visit.  HPI  Bonnie Hinton presents for diabetes follow up on uncontrolled diabetes  This visit occurred during the SARS-CoV-2 public health emergency.  Safety protocols were in place, including screening questions prior to the visit, additional usage of staff PPE, and extensive cleaning of exam room while observing appropriate contact time as indicated for disinfecting solutions.   She is celebrating that her daughter Bonnie Hinton gave birth to healthy girl one month ago..    Having trouble with right hand OA . Constant pain.  Using celebrex,  Low dose tylenol.  Right handed.   T2DM:  She  feels generally well,  But is not  exercising regularly due to bilateral knee pain, morbid obesity and deconditioning.  She is  trying to lose weight and has lost 14 lbs since October.  She is checking  blood sugarsonce daily in a fasting state.  BS have been under 200  and >150 .  Denies any recent hypoglyemic events.  Taking   medications as directed: Lantus 20 units daily ,  and mealtime insulin 5 units tid .  She is following a carbohydrate modified diet 6 days per week. Denies numbness, burning and tingling of extremities. Appetite is good.   She has been contacted by her Animator who is recommending that she allow an Scientist, forensic endorsed endocrinologist  prescribe glimepiride and pioglitazone    Outpatient Medications Prior to Visit  Medication Sig Dispense Refill  . acetaminophen (TYLENOL) 325 MG tablet Take 2 tablets (650 mg total) by mouth every 6 (six) hours as needed for mild pain (or Fever >/= 101).    Marland Kitchen amLODipine (NORVASC) 10 MG tablet TAKE 1  TABLET DAILY 90 tablet 3  . B-D UF III MINI PEN NEEDLES 31G X 5 MM MISC SMARTSIG:1 SUB-Q Every Night    . celecoxib (CELEBREX) 100 MG capsule TAKE 1 CAPSULE TWICE A DAY 180 capsule 3  . cetirizine (ZYRTEC) 10 MG tablet Take 10 mg by mouth daily.    . Cholecalciferol (VITAMIN D3) 2000 units TABS Take 1 tablet by mouth daily.    . cycloSPORINE (RESTASIS) 0.05 % ophthalmic emulsion Place 1 drop into both eyes 2 (two) times daily.    Marland Kitchen dicyclomine (BENTYL) 20 MG tablet TAKE 1 TABLET FOUR TIMES A DAY BEFORE MEALS AND AT BEDTIME 270 tablet 4  . ezetimibe (ZETIA) 10 MG tablet TAKE 1 TABLET DAILY 90 tablet 3  . furosemide (LASIX) 20 MG tablet Take 1 tablet (20 mg total) by mouth daily. 90 tablet 1  . glucose blood (ONETOUCH VERIO) test strip USE TO TEST BLOOD SUGAR ONCE DAILY 100 each 3  . insulin glargine (LANTUS SOLOSTAR) 100 UNIT/ML Solostar Pen Inject 20 units daily. Titrate as instructed. Max daily dose 30 units. 15 mL 3  . Lancets 30G MISC Use to check blood sugars once daily. 100 each 3  . levothyroxine (SYNTHROID) 137 MCG tablet Take 1 tablet (137 mcg total) by mouth daily before breakfast. 90 tablet 1  . LUMIGAN 0.01 % SOLN Place 1-2 drops into both eyes at bedtime.    Marland Kitchen nystatin cream (MYCOSTATIN) APPLY TOPICALLY TWICE A DAY 90 g 7  . pantoprazole (PROTONIX)  40 MG tablet TAKE 1 TABLET DAILY 90 tablet 3  . tiZANidine (ZANAFLEX) 4 MG capsule TAKE 1 CAPSULE THREE TIMES A DAY AS NEEDED FOR MUSCLE SPASMS 180 capsule 5  . tolterodine (DETROL LA) 4 MG 24 hr capsule TAKE 1 CAPSULE TWICE A DAY 180 capsule 3  . amoxicillin (AMOXIL) 500 MG capsule TAKE 4 CAPSULES BY MOUTH 1 HOUR PRIOR TO DENTAL PROCEDURE (Patient not taking: No sig reported) 20 capsule 2   No facility-administered medications prior to visit.    Review of Systems;  Patient denies headache, fevers, malaise, unintentional weight loss, skin rash, eye pain, sinus congestion and sinus pain, sore throat, dysphagia,  hemoptysis , cough,  dyspnea, wheezing, chest pain, palpitations, orthopnea, edema, abdominal pain, nausea, melena, diarrhea, constipation, flank pain, dysuria, hematuria, urinary  Frequency, nocturia, numbness, tingling, seizures,  Focal weakness, Loss of consciousness,  Tremor, insomnia, depression, anxiety, and suicidal ideation.      Objective:  BP 124/66 (BP Location: Left Arm, Patient Position: Sitting, Cuff Size: Large)   Pulse 75   Temp 97.8 F (36.6 C) (Oral)   Resp 16   Ht 5\' 5"  (1.651 m)   Wt (!) 309 lb (140.2 kg)   SpO2 99%   BMI 51.42 kg/m   BP Readings from Last 3 Encounters:  04/05/20 124/66  12/31/19 (!) 150/78  12/28/19 (!) 147/77    Wt Readings from Last 3 Encounters:  04/05/20 (!) 309 lb (140.2 kg)  12/31/19 (!) 323 lb (146.5 kg)  12/28/19 283 lb (128.4 kg)    General appearance: alert, cooperative and appears stated age Ears: normal TM's and external ear canals both ears Throat: lips, mucosa, and tongue normal; teeth and gums normal Neck: no adenopathy, no carotid bruit, supple, symmetrical, trachea midline and thyroid not enlarged, symmetric, no tenderness/mass/nodules Back: symmetric, no curvature. ROM normal. No CVA tenderness. Lungs: clear to auscultation bilaterally Heart: regular rate and rhythm, S1, S2 normal, no murmur, click, rub or gallop Abdomen: soft, non-tender; bowel sounds normal; no masses,  no organomegaly Pulses: 2+ and symmetric Skin: Skin color, texture, turgor normal. No rashes or lesions Lymph nodes: Cervical, supraclavicular, and axillary nodes normal.  Lab Results  Component Value Date   HGBA1C 8.3 (A) 04/05/2020   HGBA1C 10.2 Repeated and verified X2. (H) 12/31/2019   HGBA1C 7.3 (H) 09/22/2018    Lab Results  Component Value Date   CREATININE 0.77 04/05/2020   CREATININE 1.01 12/31/2019   CREATININE 0.85 09/22/2018    Lab Results  Component Value Date   WBC 8.4 06/10/2018   HGB 14.9 06/10/2018   HCT 45.1 06/10/2018   PLT 306.0  06/10/2018   GLUCOSE 153 (H) 04/05/2020   CHOL 227 (H) 04/05/2020   TRIG 279.0 (H) 04/05/2020   HDL 46.20 04/05/2020   LDLDIRECT 145.0 04/05/2020   LDLCALC 210 (H) 12/25/2016   ALT 12 04/05/2020   AST 18 04/05/2020   NA 138 04/05/2020   K 4.3 04/05/2020   CL 103 04/05/2020   CREATININE 0.77 04/05/2020   BUN 12 04/05/2020   CO2 29 04/05/2020   TSH 13.46 (H) 12/31/2019   INR 1.37 09/04/2016   HGBA1C 8.3 (A) 04/05/2020   MICROALBUR 15.6 (H) 12/31/2019   MR ABDOMEN MRCP WO CONTRAST  Result Date: 09/04/2016 CLINICAL DATA:  Right upper quadrant abdominal pain. Pancreatitis with elevated liver function studies. EXAM: MRI ABDOMEN WITHOUT CONTRAST  (INCLUDING MRCP) TECHNIQUE: Multiplanar multisequence MR imaging of the abdomen was performed. Heavily T2-weighted images of the biliary  and pancreatic ducts were obtained, and three-dimensional MRCP images were rendered by post processing. COMPARISON:  Ultrasound today and 04/06/2016. FINDINGS: Despite efforts by the technologist and patient, moderate motion artifact is present on today's exam and could not be eliminated. This reduces exam sensitivity and specificity. Lower chest: The visualized lower chest appears remarkable. No significant pleural effusion. Hepatobiliary: The liver demonstrates loss of signal on the gradient echo opposed phase images consistent with steatosis. No focal lesions are identified. Multiple small gallstones are present. There is no gallbladder wall thickening or surrounding inflammation. The ERCP images are degraded by motion. No significant biliary dilatation. No definite choledocholithiasis. Pancreas: Fatty replaced with mild surrounding edema. No focal fluid collection or ductal dilatation identified. Spleen: Normal in size without focal abnormality. Adrenals/Urinary Tract: Both adrenal glands appear normal. Tiny cyst in the lower pole of the left kidney. The right kidney appears normal. No hydronephrosis. Stomach/Bowel: No  evidence of bowel wall thickening, distention or surrounding inflammatory change. Vascular/Lymphatic: Small lymph nodes in the porta hepatis, likely reactive. No enlarged retroperitoneal lymph nodes. No significant vascular findings on noncontrast imaging identified. Other: No ascites. Musculoskeletal: No acute or significant osseous findings. IMPRESSION: 1. Motion artifact results in suboptimal evaluation of the biliary system. Cholelithiasis without evidence of cholecystitis, significant biliary dilatation or definite choledocholithiasis. 2. Hepatic steatosis. 3. Peripancreatic edema consistent with pancreatitis. No focal fluid collection. Electronically Signed   By: Carey BullocksWilliam  Veazey M.D.   On: 09/04/2016 13:53   MR 3D Recon At Scanner  Result Date: 09/04/2016 CLINICAL DATA:  Right upper quadrant abdominal pain. Pancreatitis with elevated liver function studies. EXAM: MRI ABDOMEN WITHOUT CONTRAST  (INCLUDING MRCP) TECHNIQUE: Multiplanar multisequence MR imaging of the abdomen was performed. Heavily T2-weighted images of the biliary and pancreatic ducts were obtained, and three-dimensional MRCP images were rendered by post processing. COMPARISON:  Ultrasound today and 04/06/2016. FINDINGS: Despite efforts by the technologist and patient, moderate motion artifact is present on today's exam and could not be eliminated. This reduces exam sensitivity and specificity. Lower chest: The visualized lower chest appears remarkable. No significant pleural effusion. Hepatobiliary: The liver demonstrates loss of signal on the gradient echo opposed phase images consistent with steatosis. No focal lesions are identified. Multiple small gallstones are present. There is no gallbladder wall thickening or surrounding inflammation. The ERCP images are degraded by motion. No significant biliary dilatation. No definite choledocholithiasis. Pancreas: Fatty replaced with mild surrounding edema. No focal fluid collection or ductal  dilatation identified. Spleen: Normal in size without focal abnormality. Adrenals/Urinary Tract: Both adrenal glands appear normal. Tiny cyst in the lower pole of the left kidney. The right kidney appears normal. No hydronephrosis. Stomach/Bowel: No evidence of bowel wall thickening, distention or surrounding inflammatory change. Vascular/Lymphatic: Small lymph nodes in the porta hepatis, likely reactive. No enlarged retroperitoneal lymph nodes. No significant vascular findings on noncontrast imaging identified. Other: No ascites. Musculoskeletal: No acute or significant osseous findings. IMPRESSION: 1. Motion artifact results in suboptimal evaluation of the biliary system. Cholelithiasis without evidence of cholecystitis, significant biliary dilatation or definite choledocholithiasis. 2. Hepatic steatosis. 3. Peripancreatic edema consistent with pancreatitis. No focal fluid collection. Electronically Signed   By: Carey BullocksWilliam  Veazey M.D.   On: 09/04/2016 13:53   US Abdomen Limited RUQ  Result Date: 09/04/2016 CLINICAL DATA:  Right upper quadrant pain EXAM: ULTRASOUND ABDOMEN LIMITED RIGHT UPPER QUADRANT COMPARISON:  Ultrasound abdomen 04/06/2016 FINDINGS: Gallbladder: Cholelithiasis. Largest calculus 13 mm. Negative for gallbladder wall thickening. Negative for sonographic Murphy sign Common bile duct:  Diameter: 7 mm Liver: Increased echogenicity of the liver without focal liver lesion. IMPRESSION: Cholelithiasis without evidence of cholecystitis Common bile duct upper normal Echogenic liver compatible with fatty infiltration. Electronically Signed   By: Marlan Palau M.D.   On: 09/04/2016 10:42    Assessment & Plan:   Problem List Items Addressed This Visit      Unprioritized   Diabetes mellitus with complication (HCC) - Primary   Relevant Medications   insulin aspart (NOVOLOG) 100 UNIT/ML injection   Other Relevant Orders   POCT HgB A1C (Completed)   Lipid panel (Completed)   Comprehensive  metabolic panel (Completed)   Diabetic nephropathy associated with type 2 diabetes mellitus (HCC)    Previously uncontrolled , with metformin which she stopped recently due to recurrent diarrhea.  A1c confirms improving control with BS  in  200 range on Lantus 20 units.  Advised to increase dose to 22 units and start mealtime insulin. 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 I do not agree with choices of  glimepiride and actos suggested by her insurance company's endocrinologist due to her sedentary nature likely resulting in edema . will refer to Bonnie Hinton  For help in getting her the [facility] monitor .   Lab Results  Component Value Date   HGBA1C 8.3 (A) 04/05/2020         Relevant Medications   insulin aspart (NOVOLOG) 100 UNIT/ML injection   OA (osteoarthritis) of finger, right    recommend use of tylenol , heat and exercise of muscles         I am having Bonnie Hinton start on insulin aspart. I am also having her maintain her acetaminophen, cycloSPORINE, Lumigan, Vitamin D3, amoxicillin, celecoxib, ezetimibe, dicyclomine, amLODipine, tiZANidine, nystatin cream, tolterodine, pantoprazole, Lancets 30G, OneTouch Verio, furosemide, levothyroxine, cetirizine, B-D UF III MINI PEN NEEDLES, and Lantus SoloStar.  Meds ordered this encounter  Medications  . insulin aspart (NOVOLOG) 100 UNIT/ML injection    Sig: Inject 5 Units into the skin 3 (three) times daily before meals.    Dispense:  10 mL    Refill:  PRN    There are no discontinued medications.  Follow-up: Return in about 3 months (around 07/04/2020) for follow up diabetes.   Sherlene Shams, MD

## 2020-04-05 NOTE — Patient Instructions (Addendum)
Your blood sugars are too high.  I have prescribed a mealtime insulin called Humalog.  Please pick this up from the pharmacy.  Do not start it yet.  We will then get you the continuous blood glucose monitor so we can get more data (more blood sugars)  For now,  Increase the Lantus to 22 units  Still once daily.   The glimepiride is not a good choice because you skip meals The actos is not a good choice because it will cause fluid retention   Fasting sugars should be 125 or less   Post prandial sugars should be between 100 and 160  ( 2 hours after eating)    Your right hand has osteoarthritis .  Continue taking the celebrex  . This is your anti inflammatory . You can add  up to 2000 mg of acetominophen (tylenol) every day safely  In divided doses ( 1000 mg every 12 hours.)  This can be added to your celebrex

## 2020-04-05 NOTE — Telephone Encounter (Signed)
Pharmacy sent the following message about patients rx Humalog:  Alternative Requested:THE PRESCRIBED MEDICATION IS NOT COVERED BY INSURANCE. PLEASE CONSIDER CHANGING TO ONE OF THE SUGGESTED COVERED ALTERNATIVES.

## 2020-04-06 NOTE — Progress Notes (Signed)
Your diabetes is under improving control currently. You have lowered your a1c  from 10.2 to 8.3 and your cholesterol has improved slightly as well.    please plan to return  to see me in 3  months for follow up on diabetes.

## 2020-04-07 ENCOUNTER — Telehealth: Payer: Self-pay

## 2020-04-07 NOTE — Telephone Encounter (Signed)
Called and spoke to [place] to give lab results. She states that she is having issues getting her Humalog from the Pharmacy. She asks [place] to contact the pharmacy because they refused to fill the medication.   Called and spoke to [place] at [facility] [employer] on [address] in [place]. Joni Reining states that there is no issue and that [place] can come and get her medication. Agueda has been made aware that she will pick it up today.

## 2020-04-12 ENCOUNTER — Telehealth: Payer: Self-pay | Admitting: Internal Medicine

## 2020-04-12 NOTE — Telephone Encounter (Signed)
Patient needs a letter for her insurance to state the medical reason why she is going to the foot doctor. Please send letter to patient's house so she can mail with her other forms.

## 2020-04-13 MED ORDER — LUMIGAN 0.01 % OP SOLN: 1.0000 [drp] | Freq: Every day | OPHTHALMIC | 0 refills | Status: DC

## 2020-04-13 NOTE — Addendum Note (Signed)
Addended by: Sandy Salaam on: 04/13/2020 03:01 PM   Modules accepted: Orders

## 2020-04-13 NOTE — Telephone Encounter (Signed)
LMTCB

## 2020-04-13 NOTE — Telephone Encounter (Signed)
Signed and on your desk 

## 2020-04-13 NOTE — Telephone Encounter (Signed)
Spoke with pt to let her know that the letter is done and has been placed in mail. While on the phone the pt asked with we could refill her lumigan eye drops until she could get in to see her new eye doctor. Dr. Darrick Huntsman gave the verbal okay to refill. Refilled it and pt id aware.

## 2020-04-13 NOTE — Telephone Encounter (Signed)
Patient was returning call about letter

## 2020-04-17 ENCOUNTER — Other Ambulatory Visit: Payer: Self-pay | Admitting: Internal Medicine

## 2020-04-27 ENCOUNTER — Telehealth: Payer: Self-pay | Admitting: *Deleted

## 2020-04-27 ENCOUNTER — Encounter: Payer: Self-pay | Admitting: *Deleted

## 2020-04-27 NOTE — Telephone Encounter (Signed)
I left Bonnie Hinton a message that her letter is ready and that she can pick it up at her convenience.  I told her I would leave it at the front desk.

## 2020-04-28 ENCOUNTER — Telehealth: Payer: Self-pay | Admitting: Pharmacist

## 2020-04-28 NOTE — Telephone Encounter (Signed)
  Chronic Care Management   Note  04/28/2020 Name: Bonnie Hinton MRN: [id number] DOB: 1940-09-22   Office received phone call from [place] w/ [employer]. She again wanted to discuss the [facility] endocrinologist suggesting that we prescribe glimepiride or pioglitazone. I again reiterated that Dr. Darrick Huntsman and I will not be using those agents in this patient due to risk of edema with her baseline edema (pioglitazone) and risk of hypoglycemia in combination with insulin therapy (glimepiride). [name] then suggested Ozempic and I reviewed that we will not be prescribing GLP1s due to history of pancreatitis in this patient.   Reviewed recent A1c and that patient was started on mealtime insulin. Reviewed that we will be pursuing CGM coverage now that patient is on 3+ insulin injections daily. [name] noted that [name] is the preferred DME supplier for the patient's plan. When I speak with patient at next visit we will pursue [place] coverage.   [name], PharmD, [place], CPP Clinical Pharmacist [employer] at [employer] [phone]

## 2020-05-01 NOTE — Telephone Encounter (Signed)
Pt requested in vm that letter be mailed as she is not feeling well.

## 2020-05-03 ENCOUNTER — Ambulatory Visit (INDEPENDENT_AMBULATORY_CARE_PROVIDER_SITE_OTHER): Payer: Medicare Other | Admitting: Pharmacist

## 2020-05-03 DIAGNOSIS — I10 Essential (primary) hypertension: Secondary | ICD-10-CM

## 2020-05-03 DIAGNOSIS — M791 Myalgia, unspecified site: Secondary | ICD-10-CM

## 2020-05-03 DIAGNOSIS — E118 Type 2 diabetes mellitus with unspecified complications: Secondary | ICD-10-CM | POA: Diagnosis not present

## 2020-05-03 DIAGNOSIS — E782 Mixed hyperlipidemia: Secondary | ICD-10-CM | POA: Diagnosis not present

## 2020-05-03 DIAGNOSIS — Z8673 Personal history of transient ischemic attack (TIA), and cerebral infarction without residual deficits: Secondary | ICD-10-CM

## 2020-05-03 NOTE — Patient Instructions (Signed)
Visit Information  PATIENT GOALS: Goals Addressed              This Visit's Progress     Patient Stated   .  Medication Monitoring (pt-stated)        Patient Goals/Self-Care Activities . Over the next 90 days, patient will:  - take medications as prescribed check blood glucose TID using CGM, document, and provide at future appointments        The patient verbalized understanding of instructions, educational materials, and care plan provided today and declined offer to receive copy of patient instructions, educational materials, and care plan.   Plan: Telephone follow up appointment with care management team member scheduled for:  ~ 4 weeks  [name], PharmD, [place], CPP Clinical Pharmacist [employer] at [employer] [phone]

## 2020-05-03 NOTE — Chronic Care Management (AMB) (Signed)
Chronic Care Management Pharmacy Note  05/03/2020 Name:  Bonnie Hinton MRN:  [id number] DOB:  1940-09-20  Subjective: Bonnie Hinton is an 80 y.o. year old female who is a primary patient of Darrick Huntsman, Mar Daring, MD.  The CCM team was consulted for assistance with disease management and care coordination needs.    Engaged with patient by telephone for follow up visit in response to provider referral for pharmacy case management and/or care coordination services.   Consent to Services:  The patient was given information about Chronic Care Management services, agreed to services, and gave verbal consent prior to initiation of services.  Please see initial visit note for detailed documentation.   Objective:  Lab Results  Component Value Date   CREATININE 0.77 04/05/2020   CREATININE 1.01 12/31/2019   CREATININE 0.85 09/22/2018    Lab Results  Component Value Date   HGBA1C 8.3 (A) 04/05/2020       Component Value Date/Time   CHOL 227 (H) 04/05/2020 1103   TRIG 279.0 (H) 04/05/2020 1103   HDL 46.20 04/05/2020 1103   CHOLHDL 5 04/05/2020 1103   VLDL 55.8 (H) 04/05/2020 1103   LDLCALC 210 (H) 12/25/2016 1005   LDLDIRECT 145.0 04/05/2020 1103    Clinical ASCVD: No  The 10-year ASCVD risk score Denman George DC Jr., et al., 2013) is: 47.8%   Values used to calculate the score:     Age: 89 years     Sex: Female     Is Non-Hispanic African American: No     Diabetic: Yes     Tobacco smoker: No     Systolic Blood Pressure: 124 mmHg     Is BP treated: Yes     HDL Cholesterol: 46.2 mg/dL     Total Cholesterol: 227 mg/dL     BP Readings from Last 3 Encounters:  04/05/20 124/66  12/31/19 (!) 150/78  12/28/19 (!) 147/77    Assessment: Review of patient past medical history, allergies, medications, health status, including review of consultants reports, laboratory and other test data, was performed as part of comprehensive evaluation and provision of chronic care management  services.   SDOH:  (Social Determinants of Health) assessments and interventions performed:  SDOH Interventions   Flowsheet Row Most Recent Value  SDOH Interventions   Financial Strain Interventions Intervention Not Indicated      CCM Care Plan  Allergies  Allergen Reactions  . Erythromycin Rash  . Fentanyl Other (See Comments)    Duragesic-25 Duragesic-25  . Iodinated Diagnostic Agents Swelling and Rash  . Atorvastatin     "stomach problems"  . Metformin And Related Other (See Comments)    Diarrhea even w/ XR metformin  . Victoza [Liraglutide]     Suspected to be the cause of gallstone pancreatitis June 2018  . Latex Itching and Rash  . Lisinopril Other (See Comments)    Hyperkalemia   . Other Rash    Paper tape, possibly adhesive tape PAPER TAPE     Medications Reviewed Today    Reviewed by Sandy Salaam, CMA (Certified Medical Assistant) on 04/05/20 at 1023  Med List Status: <None>  Medication Order Taking? Sig Documenting Provider Last Dose Status Informant  acetaminophen (TYLENOL) 325 MG tablet [id number] Yes Take 2 tablets (650 mg total) by mouth every 6 (six) hours as needed for mild pain (or Fever >/= 101). Shepperson, Kirstin, PA-C Taking Active Self  amLODipine (NORVASC) 10 MG tablet [id number] Yes TAKE 1 TABLET DAILY Tullo, Teresa L,  MD Taking Active   amoxicillin (AMOXIL) 500 MG capsule [id number] No TAKE 4 CAPSULES BY MOUTH 1 HOUR PRIOR TO DENTAL PROCEDURE  Patient not taking: No sig reported   Sherlene Shams, MD Not Taking Active   B-D UF III MINI PEN NEEDLES 31G X 5 MM MISC [id number] Yes SMARTSIG:1 SUB-Q Every Night [provider] Taking Active   celecoxib (CELEBREX) 100 MG capsule [id number] Yes TAKE 1 CAPSULE TWICE A DAY Sherlene Shams, MD Taking Active   cetirizine (ZYRTEC) 10 MG tablet [id number] Yes Take 10 mg by mouth daily. [provider] Taking Active   Cholecalciferol (VITAMIN D3) 2000 units TABS [id number] Yes Take 1 tablet by  mouth daily. [provider] Taking Active   cycloSPORINE (RESTASIS) 0.05 % ophthalmic emulsion [id number] Yes Place 1 drop into both eyes 2 (two) times daily. [provider] Taking Active Self  dicyclomine (BENTYL) 20 MG tablet [id number] Yes TAKE 1 TABLET FOUR TIMES A DAY BEFORE MEALS AND AT BEDTIME Sherlene Shams, MD Taking Active            Med Note Edwin Cap Mar 01, 2020 11:09 AM)    ezetimibe (ZETIA) 10 MG tablet [id number] Yes TAKE 1 TABLET DAILY Sherlene Shams, MD Taking Active   furosemide (LASIX) 20 MG tablet [id number] Yes Take 1 tablet (20 mg total) by mouth daily. Sherlene Shams, MD Taking Active   glucose blood [facility] VERIO) test strip [id number] Yes USE TO TEST BLOOD SUGAR ONCE DAILY Sherlene Shams, MD Taking Active   insulin glargine (LANTUS SOLOSTAR) 100 UNIT/ML Solostar Pen [id number] Yes Inject 20 units daily. Titrate as instructed. Max daily dose 30 units. Sherlene Shams, MD Taking Active   Lancets 30G MISC [id number] Yes Use to check blood sugars once daily. Sherlene Shams, MD Taking Active   levothyroxine (SYNTHROID) 137 MCG tablet [id number] Yes Take 1 tablet (137 mcg total) by mouth daily before breakfast. Sherlene Shams, MD Taking Active   LUMIGAN 0.01 % SOLN [id number] Yes Place 1-2 drops into both eyes at bedtime. [provider] Taking Active Self           Med Note Derrell Lolling, JESSICA R   Fri Apr 05, 2016  8:56 PM)    nystatin cream (MYCOSTATIN) [id number] Yes APPLY TOPICALLY TWICE A DAY Sherlene Shams, MD Taking Active   pantoprazole (PROTONIX) 40 MG tablet [id number] Yes TAKE 1 TABLET DAILY Sherlene Shams, MD Taking Active   tiZANidine (ZANAFLEX) 4 MG capsule [id number] Yes TAKE 1 CAPSULE THREE TIMES A DAY AS NEEDED FOR MUSCLE SPASMS Sherlene Shams, MD Taking Active   tolterodine (DETROL LA) 4 MG 24 hr capsule [id number] Yes TAKE 1 CAPSULE TWICE A DAY Sherlene Shams, MD Taking Active           Patient Active Problem List    Diagnosis Date Noted  . OA (osteoarthritis) of finger, right 04/05/2020  . Statin intolerance 03/05/2018  . 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 Candidiasis of skin 06/28/2017  . IBS (irritable bowel syndrome) 06/28/2017  . Screening for colon cancer 12/28/2016  . Idiopathic acute pancreatitis without infection or necrosis   . Diabetes mellitus with complication (HCC)   . Dizziness 05/24/2016  . Anxiety, generalized 02/20/2016  . Arthritis   . Acquired hypothyroidism   . History of CVA (cerebrovascular accident)   . Status post bilateral hip replacements   . Status post right knee replacement   .  Degenerative lumbar spinal stenosis   . Essential hypertension, benign 07/25/2015  . Preoperative clearance 07/25/2015  . Hyperlipidemia 07/25/2015  . Mixed stress and urge urinary incontinence 04/26/2015  . Diabetic nephropathy associated with type 2 diabetes mellitus (HCC) 01/24/2015  . Morbid obesity (HCC) 01/24/2015  . Vitamin D deficiency 01/24/2015  . 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 Unable to achieve control of diabetes  . Does not adhere to prescribed medication regimen  Pharmacist Clinical Goal(s):  Marland Kitchen Over the next 90 days, patient will improve control of diabetes as evidenced by improvement in A1c. . Over the next 90 days, patient will adhere to prescribed medication regimen.  Interventions: . 1:1 collaboration with Sherlene Shams, MD regarding development and update of comprehensive plan of care as evidenced by provider attestation and co-signature . Inter-disciplinary care team  collaboration (see longitudinal plan of care) . Comprehensive medication review performed; medication list updated in electronic medical record  Diabetes: . Uncontrolled; current treatment: Lantus 22 units daily (though alternates administration times based on when she is eating), Humalog 5 units prior to meals PRN higher blood sugar prior to meals   o Hx Victoza, but was discontinued after an episode of gallstone pancreatitis (recommended by GI providers while hospitalized), will avoid GLP1 moving forward o Hx metformin XR, patient d/c after diarrhea o Unsure if patient would tolerate SGLT2 d/t baseline urinary incontinence  . Current glucose readings: fastings 150-200s; post prandials 160s-200s; checking glucose at least 4 times daily  o Supper around 7:30 pm, but then checked 1:10 am: 199; 5:12 am: 184; 7:12 am: 163; breakfast: yogurt and apple; 9:46: 182; 4:48 pm: clementine w/ shake (blackberries, ice, water, protein mix) and was 218; 7:51 pm: 156; then 10:01 am 193;  . Reviewed that [facility] endocrinologist recommended glimepiride (we will not prescribe d/t irregular meal times and risk of hypoglycemia and weight gain in combination with insulin), pioglitazone (we will not prescribe d/t risk of edema in patient w/ edema), or Ozempic (patient has hx of pancreatitis, unclear if related to prior Victoza script) . Denies episodes of hypoglycemia . Current meal patterns: yesterday had cucumbers, fish, frozen steamed vegetables; had carrot cake the other day (3 slices) and reports that this resulted in higher glucose.  Marland Kitchen Discussed pharmacokinetics of basal insulin and need to take regularly every single day around the same time of the day. Discussed differences between basal and bolus insulin.  . Continue current regimen at this time.  . Patient would benefit significantly from CGM use to monitor glucose given injection burden and current # tests. Will submit order today.   Hypertension: . Controlled;  current treatment: amlodipine 10 mg daily, furosemide 20 mg daily . Not checking regularly at home . Recommended to continue current treatment regimen . Discussed benefit of ambulatory monitoring.   Hyperlipidemia: . Uncontrolled; current treatment: ezetimibe 10 mg daily;  Marland Kitchen Medications previously tried: Reports hx muscle aches and GI upset with atorvastatin, rosuvastatin 5 mg daily. Refuses retrial. At this time, not eligible for PCSK9i or Nexletol as she does not have hx ASCVD.  Marland Kitchen  Recommended to continue current regimen.  Continue focus on dietary modifications.   IBS w/ gallbladder removal: . Improved; current regimen: dicyclomine 20 mg QID PRN; taking it regularly before meals and bedtime, then waiting 30 minutes before eating. She notes significant improvement in diarrhea with this . Continue current regimen at this time.   GERD: . Controlled per patient report, current treatment: pantoprazole 40 mg BID PRN GERD . Recommended to continue current treatment regimen. Could consider de-escalation to PRN famotidine or tums instead. Will discuss moving forward.   Arthritis Pain s/p bilateral hip and knee replacements . Controlled, current treatment regimen: celecoxib 100 mg BID, acetaminophen PRN, tizanidine 4 mg TID PRN (sometimes 3-4 doses daily) . Encouraged to continue current regimen with moderation with tizanidine use   OAB/Urinary Incontience: . Moderately well controlled per patient report; taking tolterodine LA 4 mg daily . Recommended to continue current regimen at this time  Over the Counter Medications: . Current regimen: cetirizine 10 mg daily for allergies, Vitamin D 1000 units daily . No interactions noted. Recommend to continue current regimen   Patient Goals/Self-Care Activities . Over the next 90 days, patient will:  - take medications as prescribed check blood glucose TID using CGM, document, and provide at future appointments  Follow Up Plan: Telephone follow up  appointment with care management team member scheduled for: ~ 4 weeks     Medication Assistance: None required.  Patient affirms current coverage meets needs.  Follow Up:  Patient agrees to Care Plan and Follow-up.  Plan: Telephone follow up appointment with care management team member scheduled for:  ~ 4 weeks  [name], PharmD, [place], CPP Clinical Pharmacist [employer] [facility] [employer] [phone]

## 2020-05-16 ENCOUNTER — Ambulatory Visit: Payer: Medicare Other | Admitting: Pharmacist

## 2020-05-16 DIAGNOSIS — I10 Essential (primary) hypertension: Secondary | ICD-10-CM

## 2020-05-16 DIAGNOSIS — T466X5A Adverse effect of antihyperlipidemic and antiarteriosclerotic drugs, initial encounter: Secondary | ICD-10-CM

## 2020-05-16 DIAGNOSIS — E782 Mixed hyperlipidemia: Secondary | ICD-10-CM

## 2020-05-16 DIAGNOSIS — M791 Myalgia, unspecified site: Secondary | ICD-10-CM

## 2020-05-16 DIAGNOSIS — E118 Type 2 diabetes mellitus with unspecified complications: Secondary | ICD-10-CM

## 2020-05-16 NOTE — Patient Instructions (Signed)
Visit Information  PATIENT GOALS: Goals Addressed              This Visit's Progress     Patient Stated   .  Medication Monitoring (pt-stated)        Patient Goals/Self-Care Activities . Over the next 90 days, patient will:  - take medications as prescribed check blood glucose TID using CGM, document, and provide at future appointments       Patient verbalizes understanding of instructions provided today and agrees to view in MyChart.    Plan: Telephone follow up appointment with care management team member scheduled for:  ~ 4 weeks  [name], PharmD, [place], CPP Clinical Pharmacist [employer] [facility] [employer] [phone]

## 2020-05-16 NOTE — Progress Notes (Signed)
Chronic Care Management Pharmacy Note  05/16/2020 Name:  Bonnie Hinton MRN:  [id number] DOB:  1940/09/29  Subjective: Bonnie Hinton is an 80 y.o. year old female who is a primary patient of Derrel Nip, Aris Everts, MD.  The CCM team was consulted for assistance with disease management and care coordination needs.    Care coordination for Libre 2 CGM device in response to provider referral for pharmacy case management and/or care coordination services.   Consent to Services:  The patient was given information about Chronic Care Management services, agreed to services, and gave verbal consent prior to initiation of services.  Please see initial visit note for detailed documentation.   Patient Care Team: Crecencio Mc, MD as PCP - General (Internal Medicine) De Hollingshead, RPH-CPP (Pharmacist)  Recent office visits: None since our last call  Recent consult visits: None since our last call  Hospital visits: None in previous 6 months  Objective:  Lab Results  Component Value Date   CREATININE 0.77 04/05/2020   BUN 12 04/05/2020   GFR 73.46 04/05/2020   GFRNONAA 40 (L) 09/13/2016   GFRAA 46 (L) 09/13/2016   NA 138 04/05/2020   K 4.3 04/05/2020   CALCIUM 9.6 04/05/2020   CO2 29 04/05/2020    Lab Results  Component Value Date/Time   HGBA1C 8.3 (A) 04/05/2020 10:56 AM   HGBA1C 10.2 Repeated and verified X2. (H) 12/31/2019 09:13 AM   HGBA1C 7.3 (H) 09/22/2018 09:51 AM   GFR 73.46 04/05/2020 11:03 AM   GFR 52.91 (L) 12/31/2019 09:13 AM   MICROALBUR 15.6 (H) 12/31/2019 09:13 AM   MICROALBUR 6.0 (H) 02/05/2018 10:16 AM    Last diabetic Eye exam: No results found for: HMDIABEYEEXA  Last diabetic Foot exam: No results found for: HMDIABFOOTEX   Lab Results  Component Value Date   CHOL 227 (H) 04/05/2020   HDL 46.20 04/05/2020   LDLCALC 210 (H) 12/25/2016   LDLDIRECT 145.0 04/05/2020   TRIG 279.0 (H) 04/05/2020   CHOLHDL 5 04/05/2020    Hepatic Function  Latest Ref Rng & Units 04/05/2020 12/31/2019 09/22/2018  Total Protein 6.0 - 8.3 g/dL 7.3 8.0 7.4  Albumin 3.5 - 5.2 g/dL 4.2 4.5 4.3  AST 0 - 37 U/L 18 32 30  ALT 0 - 35 U/L _0 Alk Phosphatase 39 - 117 U/L 60 77 67  Total Bilirubin 0.2 - 1.2 mg/dL 0.4 0.6 0.5  Bilirubin, Direct 0.0 - 0.3 mg/dL - - -    Lab Results  Component Value Date/Time   TSH 13.46 (H) 12/31/2019 09:13 AM   TSH 4.41 06/10/2018 11:11 AM   FREET4 0.21 (L) 03/26/2017 10:21 AM    CBC Latest Ref Rng & Units 06/10/2018 09/24/2016 09/13/2016  WBC 4.0 - 10.5 K/uL 8.4 8.9 11.1(H)  Hemoglobin 12.0 - 15.0 g/dL 14.9 13.1 10.6(L)  Hematocrit 36.0 - 46.0 % 45.1 41 33.6(L)  Platelets 150.0 - 400.0 K/uL 306.0 452(A) 315    Lab Results  Component Value Date/Time   VD25OH 40.57 12/31/2019 09:13 AM   VD25OH 25.21 (L) 12/25/2016 10:05 AM    Clinical ASCVD: No  The 10-year ASCVD risk score Mikey Bussing DC Jr., et al., 2013) is: 47.8%   Values used to calculate the score:     Age: 2 years     Sex: Female     Is Non-Hispanic African American: No     Diabetic: Yes     Tobacco smoker: No  Systolic Blood Pressure: 229 mmHg     Is BP treated: Yes     HDL Cholesterol: 46.2 mg/dL     Total Cholesterol: 227 mg/dL    Depression screen [facility] 2/9 12/28/2019 12/25/2018 12/23/2017  Decreased Interest 0 0 0  Down, Depressed, Hopeless 0 0 0  PHQ - 2 Score 0 0 0  Altered sleeping - - -  Tired, decreased energy - - -  Change in appetite - - -  Feeling bad or failure about yourself  - - -  Trouble concentrating - - -  Moving slowly or fidgety/restless - - -  Suicidal thoughts - - -  PHQ-9 Score - - -  Difficult doing work/chores - - -     Social History   Tobacco Use  Smoking Status Never Smoker  Smokeless Tobacco Never Used   BP Readings from Last 3 Encounters:  04/05/20 124/66  12/31/19 (!) 150/78  12/28/19 (!) 147/77   Pulse Readings from Last 3 Encounters:  04/05/20 75  12/31/19 97  12/28/19 69   Wt Readings from  Last 3 Encounters:  04/05/20 (!) 309 lb (140.2 kg)  12/31/19 (!) 323 lb (146.5 kg)  12/28/19 283 lb (128.4 kg)    Assessment/Interventions: Review of patient past medical history, allergies, medications, health status, including review of consultants reports, laboratory and other test data, was performed as part of comprehensive evaluation and provision of chronic care management services.   SDOH:  (Social Determinants of Health) assessments and interventions performed: Yes SDOH Interventions   Flowsheet Row Most Recent Value  SDOH Interventions   Financial Strain Interventions Intervention Not Indicated      CCM Care Plan  Allergies  Allergen Reactions  . Erythromycin Rash  . Fentanyl Other (See Comments)    Duragesic-25 Duragesic-25  . Iodinated Diagnostic Agents Swelling and Rash  . Atorvastatin     "stomach problems"  . Metformin And Related Other (See Comments)    Diarrhea even w/ XR metformin  . Victoza [Liraglutide]     Suspected to be the cause of gallstone pancreatitis June 2018  . Latex Itching and Rash  . Lisinopril Other (See Comments)    Hyperkalemia   . Other Rash    Paper tape, possibly adhesive tape PAPER TAPE     Medications Reviewed Today    Reviewed by [name], CMA (Certified Medical Assistant) on 04/05/20 at 1023  Med List Status: <None>  Medication Order Taking? Sig Documenting Provider Last Dose Status Informant  acetaminophen (TYLENOL) 325 MG tablet [id number] Yes Take 2 tablets (650 mg total) by mouth every 6 (six) hours as needed for mild pain (or Fever >/= 101). Shepperson, Kirstin, PA-C Taking Active Self  amLODipine (NORVASC) 10 MG tablet [id number] Yes TAKE 1 TABLET DAILY Crecencio Mc, MD Taking Active   amoxicillin (AMOXIL) 500 MG capsule [id number] No TAKE 4 CAPSULES BY MOUTH 1 HOUR PRIOR TO DENTAL PROCEDURE  Patient not taking: No sig reported   Crecencio Mc, MD Not Taking Active   B-D UF III MINI PEN NEEDLES 31G X 5 MM MISC  [id number] Yes SMARTSIG:1 SUB-Q Every Night [provider] Taking Active   celecoxib (CELEBREX) 100 MG capsule [id number] Yes TAKE 1 CAPSULE TWICE A DAY Crecencio Mc, MD Taking Active   cetirizine (ZYRTEC) 10 MG tablet [id number] Yes Take 10 mg by mouth daily. [provider] Taking Active   Cholecalciferol (VITAMIN D3) 2000 units TABS [id number] Yes Take 1 tablet by mouth daily. [provider] Taking Active   cycloSPORINE (RESTASIS) 0.05 % ophthalmic emulsion [id number] Yes Place 1 drop into both eyes 2 (two) times daily. [provider] Taking Active Self  dicyclomine (BENTYL) 20 MG tablet [id number] Yes TAKE 1 TABLET FOUR TIMES A DAY BEFORE MEALS AND AT BEDTIME Crecencio Mc, MD Taking Active            Med Note Kelby Aline Mar 01, 2020 11:09 AM)    ezetimibe (ZETIA) 10 MG tablet [id number] Yes TAKE 1 TABLET DAILY Crecencio Mc, MD Taking Active   furosemide (LASIX) 20 MG tablet [id number] Yes Take 1 tablet (20 mg total) by mouth daily. Crecencio Mc, MD Taking Active   glucose blood [facility] VERIO) test strip [id number] Yes USE TO TEST BLOOD SUGAR ONCE DAILY Crecencio Mc, MD Taking Active   insulin glargine (LANTUS SOLOSTAR) 100 UNIT/ML Solostar Pen [id number] Yes Inject 20 units daily. Titrate as instructed. Max daily dose 30 units. Crecencio Mc, MD Taking Active   Lancets 30G MISC [id number] Yes Use to check blood sugars once daily. Crecencio Mc, MD Taking Active   levothyroxine (SYNTHROID) 137 MCG tablet [id number] Yes Take 1 tablet (137 mcg total) by mouth daily before breakfast. Crecencio Mc, MD Taking Active   LUMIGAN 0.01 % SOLN [id number] Yes Place 1-2 drops into both eyes at bedtime. [provider] Taking Active Self           Med Note Valora Piccolo, JESSICA R   Fri Apr 05, 2016  8:56 PM)    nystatin cream (MYCOSTATIN) [id number] Yes APPLY TOPICALLY TWICE A DAY Crecencio Mc, MD Taking Active   pantoprazole (PROTONIX) 40  MG tablet [id number] Yes TAKE 1 TABLET DAILY Crecencio Mc, MD Taking Active   tiZANidine (ZANAFLEX) 4 MG capsule [id number] Yes TAKE 1 CAPSULE THREE TIMES A DAY AS NEEDED FOR MUSCLE SPASMS Crecencio Mc, MD Taking Active   tolterodine (DETROL LA) 4 MG 24 hr capsule [id number] Yes TAKE 1 CAPSULE TWICE A DAY Crecencio Mc, MD Taking Active           Patient Active Problem List   Diagnosis Date Noted  . OA (osteoarthritis) of finger, right 04/05/2020  . Statin intolerance 03/05/2018  . 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 Candidiasis of skin 06/28/2017  . IBS (irritable bowel syndrome) 06/28/2017  . Screening for colon cancer 12/28/2016  . Idiopathic acute pancreatitis without infection or necrosis   . Diabetes mellitus with complication ([place])   . Dizziness 05/24/2016  . Anxiety, generalized 02/20/2016  . Arthritis   . Acquired hypothyroidism   . History of CVA (cerebrovascular accident)   . Status post bilateral hip replacements   . Status post right knee replacement   . Degenerative lumbar spinal stenosis   . Essential hypertension, benign 07/25/2015  . Preoperative clearance 07/25/2015  . Hyperlipidemia 07/25/2015  . Mixed stress and urge urinary incontinence 04/26/2015  . Diabetic nephropathy associated with type 2 diabetes mellitus ([place]) 01/24/2015  . Morbid obesity ([place]) 01/24/2015  . Vitamin D deficiency 01/24/2015  . 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 Fluad Quad(high Dose 65+) 01/01/2019, 12/31/2019  . Influenza, High Dose Seasonal PF 01/23/2015, 12/04/2015, 12/25/2016, 12/23/2017  . Moderna Sars-Covid-2 Vaccination 04/07/2019, 05/05/2019, 03/02/2020  . Pneumococcal Conjugate-13 04/25/2015  . Pneumococcal Polysaccharide-23 02/05/2018  . Tdap 01/20/2020    Conditions to be addressed/monitored:  Hypertension, Hyperlipidemia and Diabetes  Care Plan : Medication  Management  Updates made by De Hollingshead, RPH-CPP since 05/16/2020 12:00 AM    Problem: Diabetes     Long-Range Goal: Disease Progression Prevention   Recent Progress: On track  Priority: High  Note:   Current Barriers:  . Unable to achieve control of diabetes  . Does not adhere to prescribed medication regimen  Pharmacist Clinical Goal(s):  Marland Kitchen Over the next 90 days, patient will improve control of diabetes as evidenced by improvement in A1c. . Over the next 90 days, patient will adhere to prescribed medication regimen.  Interventions: . 1:1 collaboration with Crecencio Mc, MD regarding development and update of comprehensive plan of care as evidenced by provider attestation and co-signature . Inter-disciplinary care team collaboration (see longitudinal plan of care) . Comprehensive medication review performed; medication list updated in electronic medical record  Diabetes: . Uncontrolled; current treatment: Lantus 22 units daily, Humalog 5 units prior to meals PRN higher blood sugar prior to meals   o Hx Victoza, but was discontinued after an episode of gallstone pancreatitis (recommended by GI providers while hospitalized), will avoid GLP1 moving forward o Hx metformin XR, patient d/c after diarrhea o Unsure if patient would tolerate SGLT2 d/t baseline urinary incontinence  . Contacted [facility] to follow up on order for Brent 2 CGM. They note that they received the order and processed and are waiting on insurance verification. Will follow up with patient in ~2 weeks and ensure receipt of device and to schedule education  Hypertension: . Controlled; current treatment: amlodipine 10 mg daily, furosemide 20 mg daily . Not checking regularly at home . Recommended to continue current treatment regimen . Previously discussed benefit of ambulatory monitoring.   Hyperlipidemia: . Uncontrolled; current treatment: ezetimibe 10 mg daily;  Marland Kitchen Medications previously  tried: Reports hx muscle aches and GI upset with atorvastatin, rosuvastatin 5 mg daily. Refuses retrial. At this time, not eligible for PCSK9i or Nexletol as she does not have hx ASCVD.  Marland Kitchen Recommended to continue current regimen.  Continue focus on dietary modifications.   IBS w/ gallbladder removal: . Improved; current regimen: dicyclomine 20 mg QID PRN; taking appropriately 30 minutes before meals and bedtime . Continue current regimen at this time.   GERD: . Controlled per patient report, current treatment: pantoprazole 40 mg BID PRN GERD . Recommended to continue current treatment regimen. Could consider de-escalation to PRN famotidine or tums instead. Will discuss moving forward.   Arthritis Pain s/p bilateral hip and knee replacements . Controlled, current treatment regimen: celecoxib 100 mg BID, acetaminophen PRN, tizanidine 4 mg TID PRN (sometimes 3-4 doses daily) . Encouraged to continue current regimen with moderation with tizanidine use   OAB/Urinary Incontience: . Moderately well controlled per patient report; taking tolterodine LA 4 mg daily . Recommended to continue current regimen at this time  Over the Counter Medications: . Current regimen: cetirizine 10 mg daily for allergies, Vitamin D 1000 units daily . No interactions noted. Recommend to continue current regimen   Patient Goals/Self-Care Activities . Over the next 90 days, patient will:  - take medications as prescribed check blood glucose TID using CGM, document, and provide at future appointments  Follow Up Plan: Telephone follow up appointment with care management team member scheduled for: ~ 4 weeks as previously scheduled       Medication Assistance: None required.  Patient affirms current coverage meets needs.  Patient's preferred pharmacy is:  [id number]  [place] [address] [place] [address] Phone: [phone] Fax: [phone]  [facility]  [place], [place] [address] [place] [address] Phone: [phone] Fax: [phone]  Care Plan and Follow Up Patient Decision:  Patient agrees to Care Plan and Follow-up.  Plan: Telephone follow up appointment with care management team member scheduled for:  ~ 4 weeks  [name], PharmD, [place], [place] Clinical Pharmacist [employer] at [employer] [phone]

## 2020-05-18 ENCOUNTER — Other Ambulatory Visit: Payer: Self-pay | Admitting: Internal Medicine

## 2020-05-19 LAB — HM DIABETES EYE EXAM

## 2020-05-30 ENCOUNTER — Ambulatory Visit: Payer: Medicare Other | Admitting: Pharmacist

## 2020-05-30 DIAGNOSIS — I10 Essential (primary) hypertension: Secondary | ICD-10-CM

## 2020-05-30 DIAGNOSIS — M791 Myalgia, unspecified site: Secondary | ICD-10-CM

## 2020-05-30 DIAGNOSIS — E118 Type 2 diabetes mellitus with unspecified complications: Secondary | ICD-10-CM

## 2020-05-30 DIAGNOSIS — E782 Mixed hyperlipidemia: Secondary | ICD-10-CM

## 2020-05-30 DIAGNOSIS — T466X5A Adverse effect of antihyperlipidemic and antiarteriosclerotic drugs, initial encounter: Secondary | ICD-10-CM

## 2020-05-30 NOTE — Patient Instructions (Signed)
Visit Information  PATIENT GOALS: Goals Addressed              This Visit's Progress     Patient Stated   .  Medication Monitoring (pt-stated)        Patient Goals/Self-Care Activities . Over the next 90 days, patient will:  - take medications as prescribed check blood glucose three times daily using CGM, document, and provide at future appointments       The patient verbalized understanding of instructions, educational materials, and care plan provided today and declined offer to receive copy of patient instructions, educational materials, and care plan.   Plan: Telephone follow up appointment with care management team member scheduled for:  ~ 4 weeks as previously scheduled  [name], PharmD, [place], CPP Clinical Pharmacist [employer] at [employer] [phone]

## 2020-05-30 NOTE — Chronic Care Management (AMB) (Signed)
Chronic Care Management Pharmacy Note  05/30/2020 Name:  Bonnie Hinton MRN:  [id number] DOB:  06-Mar-1941  Subjective: Bonnie Hinton is an 80 y.o. year old female who is a primary patient of Tullo, Aris Everts, MD.  The CCM team was consulted for assistance with disease management and care coordination needs.    Care coordination with DME company for device access in response to provider referral for pharmacy case management and/or care coordination services.   Consent to Services:  The patient was given information about Chronic Care Management services, agreed to services, and gave verbal consent prior to initiation of services.  Please see initial visit note for detailed documentation.   Patient Care Team: Crecencio Mc, MD as PCP - General (Internal Medicine) De Hollingshead, RPH-CPP (Pharmacist)  Recent office visits: None since our last appointment  Recent consult visits: None since our last appointment  Hospital visits: None in previous 6 months  Objective:  Lab Results  Component Value Date   CREATININE 0.77 04/05/2020   BUN 12 04/05/2020   GFR 73.46 04/05/2020   GFRNONAA 40 (L) 09/13/2016   GFRAA 46 (L) 09/13/2016   NA 138 04/05/2020   K 4.3 04/05/2020   CALCIUM 9.6 04/05/2020   CO2 29 04/05/2020    Lab Results  Component Value Date/Time   HGBA1C 8.3 (A) 04/05/2020 10:56 AM   HGBA1C 10.2 Repeated and verified X2. (H) 12/31/2019 09:13 AM   HGBA1C 7.3 (H) 09/22/2018 09:51 AM   GFR 73.46 04/05/2020 11:03 AM   GFR 52.91 (L) 12/31/2019 09:13 AM   MICROALBUR 15.6 (H) 12/31/2019 09:13 AM   MICROALBUR 6.0 (H) 02/05/2018 10:16 AM    Last diabetic Eye exam: No results found for: HMDIABEYEEXA  Last diabetic Foot exam: No results found for: HMDIABFOOTEX   Lab Results  Component Value Date   CHOL 227 (H) 04/05/2020   HDL 46.20 04/05/2020   LDLCALC 210 (H) 12/25/2016   LDLDIRECT 145.0 04/05/2020   TRIG 279.0 (H) 04/05/2020   CHOLHDL 5 04/05/2020     Hepatic Function Latest Ref Rng & Units 04/05/2020 12/31/2019 09/22/2018  Total Protein 6.0 - 8.3 g/dL 7.3 8.0 7.4  Albumin 3.5 - 5.2 g/dL 4.2 4.5 4.3  AST 0 - 37 U/L 18 32 30  ALT 0 - 35 U/L 12 21 20   Alk Phosphatase 39 - 117 U/L 60 77 67  Total Bilirubin 0.2 - 1.2 mg/dL 0.4 0.6 0.5  Bilirubin, Direct 0.0 - 0.3 mg/dL - - -    Lab Results  Component Value Date/Time   TSH 13.46 (H) 12/31/2019 09:13 AM   TSH 4.41 06/10/2018 11:11 AM   FREET4 0.21 (L) 03/26/2017 10:21 AM    CBC Latest Ref Rng & Units 06/10/2018 09/24/2016 09/13/2016  WBC 4.0 - 10.5 K/uL 8.4 8.9 11.1(H)  Hemoglobin 12.0 - 15.0 g/dL 14.9 13.1 10.6(L)  Hematocrit 36.0 - 46.0 % 45.1 41 33.6(L)  Platelets 150.0 - 400.0 K/uL 306.0 452(A) 315    Lab Results  Component Value Date/Time   VD25OH 40.57 12/31/2019 09:13 AM   VD25OH 25.21 (L) 12/25/2016 10:05 AM    Clinical ASCVD: No  The 10-year ASCVD risk score Mikey Bussing DC Jr., et al., 2013) is: 47.8%   Values used to calculate the score:     Age: 11 years     Sex: Female     Is Non-Hispanic African American: No     Diabetic: Yes     Tobacco smoker: No  Systolic Blood Pressure: 527 mmHg     Is BP treated: Yes     HDL Cholesterol: 46.2 mg/dL     Total Cholesterol: 227 mg/dL    Depression screen [facility] 2/9 12/28/2019 12/25/2018 12/23/2017  Decreased Interest 0 0 0  Down, Depressed, Hopeless 0 0 0  PHQ - 2 Score 0 0 0  Altered sleeping - - -  Tired, decreased energy - - -  Change in appetite - - -  Feeling bad or failure about yourself  - - -  Trouble concentrating - - -  Moving slowly or fidgety/restless - - -  Suicidal thoughts - - -  PHQ-9 Score - - -  Difficult doing work/chores - - -     Social History   Tobacco Use  Smoking Status Never Smoker  Smokeless Tobacco Never Used   BP Readings from Last 3 Encounters:  04/05/20 124/66  12/31/19 (!) 150/78  12/28/19 (!) 147/77   Pulse Readings from Last 3 Encounters:  04/05/20 75  12/31/19 97  12/28/19 69    Wt Readings from Last 3 Encounters:  04/05/20 (!) 309 lb (140.2 kg)  12/31/19 (!) 323 lb (146.5 kg)  12/28/19 283 lb (128.4 kg)    Assessment/Interventions: Review of patient past medical history, allergies, medications, health status, including review of consultants reports, laboratory and other test data, was performed as part of comprehensive evaluation and provision of chronic care management services.   SDOH:  (Social Determinants of Health) assessments and interventions performed: Yes SDOH Interventions   Flowsheet Row Most Recent Value  SDOH Interventions   Financial Strain Interventions Intervention Not Indicated      CCM Care Plan  Allergies  Allergen Reactions  . Erythromycin Rash  . Fentanyl Other (See Comments)    Duragesic-25 Duragesic-25  . Iodinated Diagnostic Agents Swelling and Rash  . Atorvastatin     "stomach problems"  . Metformin And Related Other (See Comments)    Diarrhea even w/ XR metformin  . Victoza [Liraglutide]     Suspected to be the cause of gallstone pancreatitis June 2018  . Latex Itching and Rash  . Lisinopril Other (See Comments)    Hyperkalemia   . Other Rash    Paper tape, possibly adhesive tape PAPER TAPE     Medications Reviewed Today    Reviewed by [name], CMA (Certified Medical Assistant) on 04/05/20 at 1023  Med List Status: <None>  Medication Order Taking? Sig Documenting Provider Last Dose Status Informant  acetaminophen (TYLENOL) 325 MG tablet [id number] Yes Take 2 tablets (650 mg total) by mouth every 6 (six) hours as needed for mild pain (or Fever >/= 101). Shepperson, Kirstin, PA-C Taking Active Self  amLODipine (NORVASC) 10 MG tablet [id number] Yes TAKE 1 TABLET DAILY Crecencio Mc, MD Taking Active   amoxicillin (AMOXIL) 500 MG capsule [id number] No TAKE 4 CAPSULES BY MOUTH 1 HOUR PRIOR TO DENTAL PROCEDURE  Patient not taking: No sig reported   Crecencio Mc, MD Not Taking Active   B-D UF III MINI PEN  NEEDLES 31G X 5 MM MISC [id number] Yes SMARTSIG:1 SUB-Q Every Night [provider] Taking Active   celecoxib (CELEBREX) 100 MG capsule [id number] Yes TAKE 1 CAPSULE TWICE A DAY Crecencio Mc, MD Taking Active   cetirizine (ZYRTEC) 10 MG tablet [id number] Yes Take 10 mg by mouth daily. [provider] Taking Active   Cholecalciferol (VITAMIN D3) 2000 units TABS [id number] Yes Take 1 tablet by mouth daily. [provider] Taking Active   cycloSPORINE (RESTASIS) 0.05 % ophthalmic emulsion [id number] Yes Place 1 drop into both eyes 2 (two) times daily. [provider] Taking Active Self  dicyclomine (BENTYL) 20 MG tablet [id number] Yes TAKE 1 TABLET FOUR TIMES A DAY BEFORE MEALS AND AT BEDTIME Crecencio Mc, MD Taking Active            Med Note Kelby Aline Mar 01, 2020 11:09 AM)    ezetimibe (ZETIA) 10 MG tablet [id number] Yes TAKE 1 TABLET DAILY Crecencio Mc, MD Taking Active   furosemide (LASIX) 20 MG tablet [id number] Yes Take 1 tablet (20 mg total) by mouth daily. Crecencio Mc, MD Taking Active   glucose blood [facility] VERIO) test strip [id number] Yes USE TO TEST BLOOD SUGAR ONCE DAILY Crecencio Mc, MD Taking Active   insulin glargine (LANTUS SOLOSTAR) 100 UNIT/ML Solostar Pen [id number] Yes Inject 20 units daily. Titrate as instructed. Max daily dose 30 units. Crecencio Mc, MD Taking Active   Lancets 30G MISC [id number] Yes Use to check blood sugars once daily. Crecencio Mc, MD Taking Active   levothyroxine (SYNTHROID) 137 MCG tablet [id number] Yes Take 1 tablet (137 mcg total) by mouth daily before breakfast. Crecencio Mc, MD Taking Active   LUMIGAN 0.01 % SOLN [id number] Yes Place 1-2 drops into both eyes at bedtime. [provider] Taking Active Self           Med Note Valora Piccolo, JESSICA R   Fri Apr 05, 2016  8:56 PM)    nystatin cream (MYCOSTATIN) [id number] Yes APPLY TOPICALLY TWICE A DAY Crecencio Mc, MD Taking Active    pantoprazole (PROTONIX) 40 MG tablet [id number] Yes TAKE 1 TABLET DAILY Crecencio Mc, MD Taking Active   tiZANidine (ZANAFLEX) 4 MG capsule [id number] Yes TAKE 1 CAPSULE THREE TIMES A DAY AS NEEDED FOR MUSCLE SPASMS Crecencio Mc, MD Taking Active   tolterodine (DETROL LA) 4 MG 24 hr capsule [id number] Yes TAKE 1 CAPSULE TWICE A DAY Crecencio Mc, MD Taking Active           Patient Active Problem List   Diagnosis Date Noted  . OA (osteoarthritis) of finger, right 04/05/2020  . Statin intolerance 03/05/2018  . 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 Candidiasis of skin 06/28/2017  . IBS (irritable bowel syndrome) 06/28/2017  . Screening for colon cancer 12/28/2016  . Idiopathic acute pancreatitis without infection or necrosis   . Diabetes mellitus with complication ([place])   . Dizziness 05/24/2016  . Anxiety, generalized 02/20/2016  . Arthritis   . Acquired hypothyroidism   . History of CVA (cerebrovascular accident)   . Status post bilateral hip replacements   . Status post right knee replacement   . Degenerative lumbar spinal stenosis   . Essential hypertension, benign 07/25/2015  . Preoperative clearance 07/25/2015  . Hyperlipidemia 07/25/2015  . Mixed stress and urge urinary incontinence 04/26/2015  . Diabetic nephropathy associated with type 2 diabetes mellitus ([place]) 01/24/2015  . Morbid obesity ([place]) 01/24/2015  . Vitamin D deficiency 01/24/2015  . 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 Fluad Quad(high Dose 65+) 01/01/2019, 12/31/2019  . Influenza, High Dose Seasonal PF 01/23/2015, 12/04/2015, 12/25/2016, 12/23/2017  . Moderna Sars-Covid-2 Vaccination 04/07/2019, 05/05/2019, 03/02/2020  . Pneumococcal Conjugate-13 04/25/2015  . Pneumococcal Polysaccharide-23 02/05/2018  . Tdap 01/20/2020    Conditions to be addressed/monitored:  Hypertension, Hyperlipidemia and  Diabetes  Care Plan : Medication Management  Updates made by De Hollingshead, RPH-CPP since 05/30/2020 12:00 AM    Problem: Diabetes     Long-Range Goal: Disease Progression Prevention   Recent Progress: On track  Priority: High  Note:   Current Barriers:  . Unable to achieve control of diabetes  . Does not adhere to prescribed medication regimen  Pharmacist Clinical Goal(s):  Marland Kitchen Over the next 90 days, patient will improve control of diabetes as evidenced by improvement in A1c. . Over the next 90 days, patient will adhere to prescribed medication regimen.  Interventions: . 1:1 collaboration with Crecencio Mc, MD regarding development and update of comprehensive plan of care as evidenced by provider attestation and co-signature . Inter-disciplinary care team collaboration (see longitudinal plan of care) . Comprehensive medication review performed; medication list updated in electronic medical record  Diabetes: . Uncontrolled; current treatment: Lantus 22 units daily, Humalog 5 units prior to meals PRN higher blood sugar prior to meals   o Hx Victoza, but was discontinued after an episode of gallstone pancreatitis (recommended by GI providers while hospitalized), will avoid GLP1 moving forward o Hx metformin XR, patient d/c after diarrhea o Unsure if patient would tolerate SGLT2 d/t baseline urinary incontinence  . Contacted [facility] to follow up on order for [place] 2 CGM. Reports insurance has been verified, they are waiting on authorization from the insurance and require clinical notes from our office. Faxed most recent Dr. Derrel Nip visit note and my visit note to [phone]; patient acct # [id number].  Hypertension: . Controlled; current treatment: amlodipine 10 mg daily, furosemide 20 mg daily . Recommended to continue current treatment regimen . Previously discussed benefit of ambulatory monitoring.   Hyperlipidemia: . Uncontrolled; current treatment: ezetimibe  10 mg daily;  Marland Kitchen Medications previously tried: Reports hx muscle aches and GI upset with atorvastatin, rosuvastatin 5 mg daily. Refuses retrial. At this time, not eligible for PCSK9i or Nexletol as she does not have hx ASCVD.  Marland Kitchen Recommended to continue current regimen.  Continue focus on dietary modifications.   IBS w/ gallbladder removal: . Improved; current regimen: dicyclomine 20 mg QID PRN; taking appropriately 30 minutes before meals and bedtime . Continue current regimen at this time.   GERD: . Controlled per patient report, current treatment: pantoprazole 40 mg BID PRN GERD . Previously recommended to continue current treatment regimen. Could consider de-escalation to PRN famotidine or tums instead. Will discuss moving forward.   Arthritis Pain s/p bilateral hip and knee replacements . Controlled, current treatment regimen: celecoxib 100 mg BID, acetaminophen PRN, tizanidine 4 mg TID PRN (sometimes 3-4 doses daily) . Previously encouraged to continue current regimen with moderation with tizanidine use   OAB/Urinary Incontience: . Moderately well controlled per patient report; taking tolterodine LA 4 mg daily . Previously recommended to continue current regimen.  Over the Counter Medications: . Current regimen: cetirizine 10 mg daily for allergies, Vitamin D 1000 units daily . No interactions noted. Previously recommended to continue current regimen   Patient Goals/Self-Care Activities . Over the next 90 days, patient will:  - take medications as prescribed check blood glucose three times daily using CGM, document, and provide at future appointments  Follow Up Plan: Telephone follow up appointment with care management team member scheduled for: ~ 4 weeks as previously scheduled       Medication Assistance: None required.  Patient affirms current coverage meets needs.  Patient's preferred pharmacy is:  [id number] [address]Phone: [phone]: [phone] [facility] [place]- [address]Phone: [phone]: [phone]  Care Plan and Follow Up Patient Decision:  Patient agrees to Care Plan and Follow-up.  Plan: Telephone follow up appointment with care management team member scheduled for:  ~ 4 weeks as previously scheduled  Catie TDarnelle Maffucci PharmD, BSharon CFoleyClinical Pharmacist LOccidental Petroleumat BJohnson & [phone]

## 2020-06-07 ENCOUNTER — Other Ambulatory Visit: Payer: Self-pay | Admitting: Internal Medicine

## 2020-06-07 DIAGNOSIS — N3941 Urge incontinence: Secondary | ICD-10-CM

## 2020-06-12 ENCOUNTER — Other Ambulatory Visit: Payer: Self-pay | Admitting: Internal Medicine

## 2020-06-15 ENCOUNTER — Other Ambulatory Visit: Payer: Self-pay

## 2020-06-15 ENCOUNTER — Encounter: Payer: Self-pay | Admitting: Podiatry

## 2020-06-15 ENCOUNTER — Ambulatory Visit: Payer: Medicare Other | Admitting: Podiatry

## 2020-06-15 DIAGNOSIS — E1142 Type 2 diabetes mellitus with diabetic polyneuropathy: Secondary | ICD-10-CM | POA: Diagnosis not present

## 2020-06-15 DIAGNOSIS — B351 Tinea unguium: Secondary | ICD-10-CM

## 2020-06-15 DIAGNOSIS — M79676 Pain in unspecified toe(s): Secondary | ICD-10-CM

## 2020-06-15 NOTE — Progress Notes (Signed)
This patient returns to my office for at risk foot care.  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  This patient is unable to cut nails herself since the patient cannot reach her  nails.These nails are painful walking and wearing shoes.  This patient presents for at risk foot care today.  General Appearance  Alert, conversant and in no acute stress.  Vascular  Dorsalis pedis and posterior tibial  pulses are palpable  bilaterally.  Capillary return is within normal limits  bilaterally. Temperature is within normal limits  bilaterally.  Neurologic  Senn-Weinstein monofilament wire test within normal limits  bilaterally. Muscle power within normal limits bilaterally.  Nails Thick disfigured discolored nails with subungual debris  from hallux to fifth toes bilaterally. No evidence of bacterial infection or drainage bilaterally.  Orthopedic  No limitations of motion  feet .  No crepitus or effusions noted.  Contracted digits  B/L.  Severe PTTD left foot.   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  Hammer toes 2,3 right foot.  Skin  normotropic skin with no porokeratosis noted bilaterally.  No signs of infections or ulcers noted.     Onychomycosis  Pain in right toes  Pain in left toes  Consent was obtained for treatment procedures.   Mechanical debridement of nails 1-5  bilaterally performed with a nail nipper.  Filed with dremel without incident. No infection or ulcer.     Return office visit 3 months         Told patient to return for periodic foot care and evaluation due to potential at risk complications.   Helane Gunther DPM

## 2020-06-21 ENCOUNTER — Ambulatory Visit (INDEPENDENT_AMBULATORY_CARE_PROVIDER_SITE_OTHER): Payer: Medicare Other | Admitting: Pharmacist

## 2020-06-21 DIAGNOSIS — E782 Mixed hyperlipidemia: Secondary | ICD-10-CM

## 2020-06-21 DIAGNOSIS — I10 Essential (primary) hypertension: Secondary | ICD-10-CM | POA: Diagnosis not present

## 2020-06-21 DIAGNOSIS — T466X5A Adverse effect of antihyperlipidemic and antiarteriosclerotic drugs, initial encounter: Secondary | ICD-10-CM

## 2020-06-21 DIAGNOSIS — E118 Type 2 diabetes mellitus with unspecified complications: Secondary | ICD-10-CM

## 2020-06-21 DIAGNOSIS — M791 Myalgia, unspecified site: Secondary | ICD-10-CM

## 2020-06-21 NOTE — Patient Instructions (Signed)
Visit Information  PATIENT GOALS:  Goals Addressed               This Visit's Progress     Patient Stated     Medication Monitoring (pt-stated)        Patient Goals/Self-Care Activities Over the next 90 days, patient will:  - take medications as prescribed check blood glucose three times daily using CGM, document, and provide at future appointments         Patient verbalizes understanding of instructions provided today and agrees to view in MyChart.  Plan: Telephone follow up appointment with care management team member scheduled for:  8 weeks  Catie Briscoe Daniello, PharmD, BCACP, CPP Clinical Pharmacist [facility] [place] [phone]  

## 2020-06-21 NOTE — Chronic Care Management (AMB) (Signed)
Chronic Care Management Pharmacy Note  06/21/2020 Name:  Bonnie Hinton MRN:  [id number] DOB:  1940-08-03  Subjective: Bonnie Hinton is an 80 y.o. year old female who is a primary patient of Derrel Nip, Aris Everts, MD.  The CCM team was consulted for assistance with disease management and care coordination needs.    Engaged with patient by telephone for follow up visit in response to provider referral for pharmacy case management and/or care coordination services.   Consent to Services:  The patient was given information about Chronic Care Management services, agreed to services, and gave verbal consent prior to initiation of services.  Please see initial visit note for detailed documentation.   Patient Care Team: Crecencio Mc, MD as PCP - General (Internal Medicine) De Hollingshead, RPH-CPP (Pharmacist)  Recent office visits: None since our last appointment  [facility] visits: None in previous 6 months  Objective:  Lab Results  Component Value Date   CREATININE 0.77 04/05/2020   CREATININE 1.01 12/31/2019   CREATININE 0.85 09/22/2018    Lab Results  Component Value Date   HGBA1C 8.3 (A) 04/05/2020   Last diabetic Eye exam:  Lab Results  Component Value Date/Time   HMDIABEYEEXA No Retinopathy 05/19/2020 12:00 AM    Last diabetic Foot exam: No results found for: HMDIABFOOTEX      Component Value Date/Time   CHOL 227 (H) 04/05/2020 1103   TRIG 279.0 (H) 04/05/2020 1103   HDL 46.20 04/05/2020 1103   CHOLHDL 5 04/05/2020 1103   VLDL 55.8 (H) 04/05/2020 1103   LDLCALC 210 (H) 12/25/2016 1005   LDLDIRECT 145.0 04/05/2020 1103    Hepatic Function Latest Ref Rng & Units 04/05/2020 12/31/2019 09/22/2018  Total Protein 6.0 - 8.3 g/dL 7.3 8.0 7.4  Albumin 3.5 - 5.2 g/dL 4.2 4.5 4.3  AST 0 - 37 U/L 18 32 30  ALT 0 - 35 U/L 12 21 20   Alk Phosphatase 39 - 117 U/L 60 77 67  Total Bilirubin 0.2 - 1.2 mg/dL 0.4 0.6 0.5   Bilirubin, Direct 0.0 - 0.3 mg/dL - - -    Lab Results  Component Value Date/Time   TSH 13.46 (H) 12/31/2019 09:13 AM   TSH 4.41 06/10/2018 11:11 AM   FREET4 0.21 (L) 03/26/2017 10:21 AM    CBC Latest Ref Rng & Units 06/10/2018 09/24/2016 09/13/2016  WBC 4.0 - 10.5 K/uL 8.4 8.9 11.1(H)  Hemoglobin 12.0 - 15.0 g/dL 14.9 13.1 10.6(L)  Hematocrit 36.0 - 46.0 % 45.1 41 33.6(L)  Platelets 150.0 - 400.0 K/uL 306.0 452(A) 315    Lab Results  Component Value Date/Time   VD25OH 40.57 12/31/2019 09:13 AM   VD25OH 25.21 (L) 12/25/2016 10:05 AM   Clinical ASCVD: Yes    Social History   Tobacco Use  Smoking Status Never Smoker  Smokeless Tobacco Never Used   BP Readings from Last 3 Encounters:  04/05/20 124/66  12/31/19 (!) 150/78  12/28/19 (!) 147/77   Pulse Readings from Last 3 Encounters:  04/05/20 75  12/31/19 97  12/28/19 69   Wt Readings from Last 3 Encounters:  04/05/20 (!) 309 lb (140.2 kg)  12/31/19 (!) 323 lb (146.5 kg)  12/28/19 283 lb (128.4 kg)    Assessment: Review of patient past medical history, allergies, medications, health status, including review of consultants reports, laboratory and other test data, was performed as part of comprehensive evaluation and provision of chronic care management services.  SDOH:  (Social Determinants of Health) assessments and interventions performed:    CCM Care Plan  Allergies  Allergen Reactions  . Erythromycin Rash  . Fentanyl Other (See Comments)    Duragesic-25 Duragesic-25  . Iodinated Diagnostic Agents Swelling and Rash  . Atorvastatin     "stomach problems"  . Metformin And Related Other (See Comments)    Diarrhea even w/ XR metformin  . Victoza [Liraglutide]     Suspected to be the cause of gallstone pancreatitis June 2018  . Latex Itching and Rash  . Lisinopril Other (See Comments)    Hyperkalemia   . Other Rash    Paper tape, possibly adhesive tape PAPER TAPE     Medications Reviewed Today     Reviewed by De Hollingshead, RPH-CPP (Pharmacist) on 06/21/20 at 1310  Med List Status: <None>  Medication Order Taking? Sig Documenting Provider Last Dose Status Informant  acetaminophen (TYLENOL) 325 MG tablet [id number]  Take 2 tablets (650 mg total) by mouth every 6 (six) hours as needed for mild pain (or Fever >/= 101). Shepperson, Kirstin, PA-C  Active Self  amLODipine (NORVASC) 10 MG tablet [id number] Yes TAKE 1 TABLET DAILY Crecencio Mc, MD Taking Active   amoxicillin (AMOXIL) 500 MG capsule [id number] Yes TAKE 4 CAPSULES BY MOUTH 1 HOUR PRIOR TO DENTAL PROCEDURE Crecencio Mc, MD Taking Active   B-D UF III MINI PEN NEEDLES 31G X 5 MM MISC [id number]  SMARTSIG:1 SUB-Q Every Night [provider]  Active   celecoxib (CELEBREX) 100 MG capsule [id number] Yes TAKE 1 CAPSULE TWICE A DAY Crecencio Mc, MD Taking Active   cetirizine (ZYRTEC) 10 MG tablet [id number] Yes Take 10 mg by mouth daily. [provider] Taking Active   Cholecalciferol (VITAMIN D3) 2000 units TABS [id number] Yes Take 1 tablet by mouth daily. [provider] Taking Active   cycloSPORINE (RESTASIS) 0.05 % ophthalmic emulsion [id number] Yes Place 1 drop into both eyes 2 (two) times daily. [provider] Taking Active Self  dicyclomine (BENTYL) 20 MG tablet [id number] Yes TAKE 1 TABLET FOUR TIMES A DAY BEFORE MEALS AND AT BEDTIME Crecencio Mc, MD Taking Active            Med Note Kelby Aline Mar 01, 2020 11:09 AM)    ezetimibe (ZETIA) 10 MG tablet [id number] Yes TAKE 1 TABLET DAILY Crecencio Mc, MD Taking Active   furosemide (LASIX) 20 MG tablet [id number] Yes TAKE 1 TABLET DAILY Crecencio Mc, MD Taking Active   glucose blood [facility] VERIO) test strip [id number] Yes USE TO TEST BLOOD SUGAR ONCE DAILY Crecencio Mc, MD Taking Active   insulin glargine (LANTUS SOLOSTAR) 100 UNIT/ML Solostar Pen [id number] Yes Inject 20 units daily. Titrate as instructed. Max daily dose  30 units. Crecencio Mc, MD Taking Active   insulin lispro (HUMALOG) 100 UNIT/ML cartridge [id number] Yes Inject 0.05 mLs (5 Units total) into the skin 3 (three) times daily with meals. Crecencio Mc, MD Taking Active   Lancets 30G MISC [id number] Yes Use to check blood sugars once daily. Crecencio Mc, MD Taking Active   levothyroxine (SYNTHROID) 137 MCG tablet [id number] Yes TAKE 1 TABLET DAILY BEFORE BREAKFAST Crecencio Mc, MD Taking Active   LUMIGAN 0.01 % SOLN [id number] Yes Place 1-2 drops into both eyes at bedtime. Crecencio Mc, MD Taking Active   nystatin cream Royden Purl) [id number] Yes APPLY TOPICALLY TWICE A DAY Crecencio Mc, MD  Taking Active   pantoprazole (PROTONIX) 40 MG tablet [id number] Yes TAKE 1 TABLET DAILY Crecencio Mc, MD Taking Active   tiZANidine (ZANAFLEX) 4 MG capsule [id number] Yes TAKE 1 CAPSULE THREE TIMES A DAY AS NEEDED FOR MUSCLE SPASMS Crecencio Mc, MD Taking Active   tolterodine (DETROL LA) 4 MG 24 hr capsule [id number] Yes TAKE 1 CAPSULE TWICE A DAY Crecencio Mc, MD Taking Active           Patient Active Problem List   Diagnosis Date Noted  . OA (osteoarthritis) of finger, right 04/05/2020  . Statin intolerance 03/05/2018  . 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 Candidiasis of skin 06/28/2017  . IBS (irritable bowel syndrome) 06/28/2017  . Screening for colon cancer 12/28/2016  . Idiopathic acute pancreatitis without infection or necrosis   . Diabetes mellitus with complication ([place])   . Dizziness 05/24/2016  . Anxiety, generalized 02/20/2016  . Arthritis   . Acquired hypothyroidism   . History of CVA (cerebrovascular accident)   . Status post bilateral hip replacements   . Status post right knee replacement   . Degenerative lumbar spinal stenosis   . Essential hypertension, benign 07/25/2015  . Preoperative clearance 07/25/2015  . Hyperlipidemia 07/25/2015  . Mixed stress and urge urinary incontinence 04/26/2015  . Diabetic  nephropathy associated with type 2 diabetes mellitus ([place]) 01/24/2015  . Morbid obesity ([place]) 01/24/2015  . Vitamin D deficiency 01/24/2015  . 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 Fluad Quad(high Dose 65+) 01/01/2019, 12/31/2019  . Influenza, High Dose Seasonal PF 01/23/2015, 12/04/2015, 12/25/2016, 12/23/2017  . Moderna Sars-Covid-2 Vaccination 04/07/2019, 05/05/2019, 03/02/2020  . Pneumococcal Conjugate-13 04/25/2015  . Pneumococcal Polysaccharide-23 02/05/2018  . Tdap 01/20/2020    Conditions to be addressed/monitored: HTN, HLD and DMII  Care Plan : Medication Management  Updates made by De Hollingshead, RPH-CPP since 06/21/2020 12:00 AM    Problem: Diabetes     Long-Range Goal: Disease Progression Prevention   This Visit's Progress: On track  Recent Progress: On track  Priority: High  Note:   Current Barriers:  . Unable to achieve control of diabetes  . Does not adhere to prescribed medication regimen  Pharmacist Clinical Goal(s):  Marland Kitchen Over the next 90 days, patient will improve control of diabetes as evidenced by improvement in A1c. . Over the next 90 days, patient will adhere to prescribed medication regimen.  Interventions: . 1:1 collaboration with Crecencio Mc, MD regarding development and update of comprehensive plan of care as evidenced by provider attestation and co-signature . Inter-disciplinary care team collaboration (see longitudinal plan of care) . Comprehensive medication review performed; medication list updated in electronic medical record   Health Maintenance: . Cataract surgery on R eye on Friday, L eye on the Monday after Easter. She reports she is nervous. Been instructed to hold insulin prior to the procedure.  . Discussed stress prior to procedure can impact glycemic control.    Diabetes: . Uncontrolled; current treatment: Lantus 22 units daily,  Humalog 5 units prior to meals PRN higher blood sugar prior to meals   o Hx Victoza, but was discontinued after an episode of gallstone pancreatitis (recommended by GI providers while hospitalized), will avoid GLP1 moving forward o Hx metformin XR, patient d/c after diarrhea o Unsure if patient would tolerate SGLT2 d/t baseline urinary incontinence  . Contacted [facility] [employer] to follow up on order for [place] 2  CGM. Reports they received the additional clinical documentation faxed on 3/25 but it has not been processed yet. They note they may be able to process later today.  . Will f/u within a week on status of order. If processed and approved, will collaborate w/ patient to schedule CGM education (after cataract procedures).   Hypertension: . Controlled; current treatment: amlodipine 10 mg daily, furosemide 20 mg daily . Recommended to continue current treatment regimen . Previously discussed benefit of ambulatory monitoring.   Hyperlipidemia: . Uncontrolled; current treatment: ezetimibe 10 mg daily;  Marland Kitchen Medications previously tried: Reports hx muscle aches and GI upset with atorvastatin, rosuvastatin 5 mg daily. Refuses retrial. At this time, not eligible for PCSK9i or Nexletol as she does not have hx ASCVD. Given age intolerances, continue current regimen at this time  IBS w/ gallbladder removal: . Improved; current regimen: dicyclomine 20 mg QID PRN; taking appropriately 30 minutes before meals and bedtime . Notes that she very infrequently takes a second tablet of dicyclomine, especially if she is planning on going out somewhere and she is afraid of an episode of diarrhea.  . Have previously discussed impact of gallbladder removal on processing dietary fat.  . Continue current regimen at this time. Re-referral to GI likely appropriate for chronic diarrhea uncontrolled on max dicyclomine doses. Patient plans to discuss w/ PCP at next appointment  GERD: . Controlled per patient report,  current treatment: pantoprazole 40 mg BID PRN GERD . Recommended to continue current treatment regimen.  . Could consider de-escalation to PRN famotidine or tums instead. Will discuss moving forward.  . GI referral as above.  Arthritis Pain s/p bilateral hip and knee replacements . Controlled, current treatment regimen: celecoxib 100 mg BID, acetaminophen PRN, tizanidine 4 mg TID PRN (sometimes 3-4 doses daily) . Previously encouraged to continue current regimen with moderation with tizanidine use   OAB/Urinary Incontience: . Moderately well controlled per patient report; taking tolterodine LA 4 mg daily . Previously recommended to continue current regimen.  Over the Counter Medications: . Current regimen: cetirizine 10 mg daily for allergies, Vitamin D 1000 units daily . No interactions noted. Previously recommended to continue current regimen   Patient Goals/Self-Care Activities . Over the next 90 days, patient will:  - take medications as prescribed check blood glucose three times daily using CGM, document, and provide at future appointments  Follow Up Plan: Telephone follow up appointment with care management team member scheduled for: ~ 8 weeks      Medication Assistance: None required.  Patient affirms current coverage meets needs.  Patient's preferred pharmacy is:  [id number] [place], [place] [place]Phone: [phone]: [phone] [facility] [place] [place] South[place]ansas[address]Phone: [phone]: [phone] Uses pill box? Yes Pt endorses 100% compliance  Follow Up:  Patient agrees to Care Plan and Follow-up.  Plan: Telephone follow up appointment with care management team member scheduled for:  ~ 8 weeks  Catie TDarnelle Maffucci PharmD, BSt. Marys CWisnerClinical Pharmacist LOccidental Petroleumat BJohnson & [phone]

## 2020-06-22 ENCOUNTER — Telehealth: Payer: Self-pay

## 2020-06-22 ENCOUNTER — Ambulatory Visit: Payer: Medicare Other | Admitting: Pharmacist

## 2020-06-22 DIAGNOSIS — E782 Mixed hyperlipidemia: Secondary | ICD-10-CM

## 2020-06-22 DIAGNOSIS — M791 Myalgia, unspecified site: Secondary | ICD-10-CM

## 2020-06-22 DIAGNOSIS — E118 Type 2 diabetes mellitus with unspecified complications: Secondary | ICD-10-CM

## 2020-06-22 DIAGNOSIS — I10 Essential (primary) hypertension: Secondary | ICD-10-CM

## 2020-06-22 NOTE — Patient Instructions (Signed)
Visit Information  PATIENT GOALS: Goals Addressed              This Visit's Progress     Patient Stated   .  Medication Monitoring (pt-stated)        Patient Goals/Self-Care Activities . Over the next 90 days, patient will:  - take medications as prescribed check blood glucose three times daily using CGM, document, and provide at future appointments         The patient verbalized understanding of instructions, educational materials, and care plan provided today and declined offer to receive copy of patient instructions, educational materials, and care plan.   Plan: Telephone follow up appointment with care management team member scheduled for:  ~ 8 weeks as previously scheduled  [name], PharmD, [place], CPP Clinical Pharmacist [employer] [facility] [employer] [phone]

## 2020-06-22 NOTE — Telephone Encounter (Signed)
Pt called and states that [facility] called her and she wants to speak with you about the conversation she had with them.

## 2020-06-22 NOTE — Telephone Encounter (Signed)
Lori dietitian from [facility] about patient her number is  [phone]

## 2020-06-22 NOTE — Telephone Encounter (Signed)
I returned patient's call. See CCM documentation. Patient notes that she and Lawson Fiscal both called the office to talk to me to "see who would get through first". Patient notes that since I talked to her, I do not need to call Lawson Fiscal back to relay the same message.

## 2020-06-22 NOTE — Chronic Care Management (AMB) (Signed)
Chronic Care Management Pharmacy Note  06/22/2020 Name:  Bonnie Hinton MRN:  [id number] DOB:  12-25-1940  Subjective: Bonnie Hinton is an 80 y.o. year old female who is a primary patient of Tullo, Aris Everts, MD.  The CCM team was consulted for assistance with disease management and care coordination needs.    Engaged with patient by telephone for reponse to a call from her today about device access in response to provider referral for pharmacy case management and/or care coordination services.   Consent to Services:  The patient was given information about Chronic Care Management services, agreed to services, and gave verbal consent prior to initiation of services.  Please see initial visit note for detailed documentation.   Patient Care Team: Crecencio Mc, MD as PCP - General (Internal Medicine) De Hollingshead, RPH-CPP (Pharmacist)  Recent office visits: None since our last call  Recent consult visits: None since our last call  Hospital visits: None in previous 6 months  Objective:  Lab Results  Component Value Date   CREATININE 0.77 04/05/2020   CREATININE 1.01 12/31/2019   CREATININE 0.85 09/22/2018    Lab Results  Component Value Date   HGBA1C 8.3 (A) 04/05/2020   Last diabetic Eye exam:  Lab Results  Component Value Date/Time   HMDIABEYEEXA No Retinopathy 05/19/2020 12:00 AM    Last diabetic Foot exam: No results found for: HMDIABFOOTEX      Component Value Date/Time   CHOL 227 (H) 04/05/2020 1103   TRIG 279.0 (H) 04/05/2020 1103   HDL 46.20 04/05/2020 1103   CHOLHDL 5 04/05/2020 1103   VLDL 55.8 (H) 04/05/2020 1103   LDLCALC 210 (H) 12/25/2016 1005   LDLDIRECT 145.0 04/05/2020 1103    Hepatic Function Latest Ref Rng & Units 04/05/2020 12/31/2019 09/22/2018  Total Protein 6.0 - 8.3 g/dL 7.3 8.0 7.4  Albumin 3.5 - 5.2 g/dL 4.2 4.5 4.3  AST 0 - 37 U/L 18 32 30  ALT 0 - 35 U/L 12 21 20   Alk Phosphatase 39 - 117 U/L 60 77 67  Total  Bilirubin 0.2 - 1.2 mg/dL 0.4 0.6 0.5  Bilirubin, Direct 0.0 - 0.3 mg/dL - - -    Lab Results  Component Value Date/Time   TSH 13.46 (H) 12/31/2019 09:13 AM   TSH 4.41 06/10/2018 11:11 AM   FREET4 0.21 (L) 03/26/2017 10:21 AM    CBC Latest Ref Rng & Units 06/10/2018 09/24/2016 09/13/2016  WBC 4.0 - 10.5 K/uL 8.4 8.9 11.1(H)  Hemoglobin 12.0 - 15.0 g/dL 14.9 13.1 10.6(L)  Hematocrit 36.0 - 46.0 % 45.1 41 33.6(L)  Platelets 150.0 - 400.0 K/uL 306.0 452(A) 315    Lab Results  Component Value Date/Time   VD25OH 40.57 12/31/2019 09:13 AM   VD25OH 25.21 (L) 12/25/2016 10:05 AM    Clinical ASCVD: No  The 10-year ASCVD risk score Mikey Bussing DC Jr., et al., 2013) is: 47.8%   Values used to calculate the score:     Age: 29 years     Sex: Female     Is Non-Hispanic African American: No     Diabetic: Yes     Tobacco smoker: No     Systolic Blood Pressure: 630 mmHg     Is BP treated: Yes     HDL Cholesterol: 46.2 mg/dL     Total Cholesterol: 227 mg/dL      Social History   Tobacco Use  Smoking Status Never Smoker  Smokeless Tobacco Never Used  BP Readings from Last 3 Encounters:  04/05/20 124/66  12/31/19 (!) 150/78  12/28/19 (!) 147/77   Pulse Readings from Last 3 Encounters:  04/05/20 75  12/31/19 97  12/28/19 69   Wt Readings from Last 3 Encounters:  04/05/20 (!) 309 lb (140.2 kg)  12/31/19 (!) 323 lb (146.5 kg)  12/28/19 283 lb (128.4 kg)    Assessment: Review of patient past medical history, allergies, medications, health status, including review of consultants reports, laboratory and other test data, was performed as part of comprehensive evaluation and provision of chronic care management services.   SDOH:  (Social Determinants of Health) assessments and interventions performed:    CCM Care Plan  Allergies  Allergen Reactions  . Erythromycin Rash  . Fentanyl Other (See Comments)    Duragesic-25 Duragesic-25  . Iodinated Diagnostic Agents Swelling and Rash   . Atorvastatin     "stomach problems"  . Metformin And Related Other (See Comments)    Diarrhea even w/ XR metformin  . Victoza [Liraglutide]     Suspected to be the cause of gallstone pancreatitis June 2018  . Latex Itching and Rash  . Lisinopril Other (See Comments)    Hyperkalemia   . Other Rash    Paper tape, possibly adhesive tape PAPER TAPE     Medications Reviewed Today    Reviewed by De Hollingshead, RPH-CPP (Pharmacist) on 06/21/20 at 1310  Med List Status: <None>  Medication Order Taking? Sig Documenting Provider Last Dose Status Informant  acetaminophen (TYLENOL) 325 MG tablet [id number]  Take 2 tablets (650 mg total) by mouth every 6 (six) hours as needed for mild pain (or Fever >/= 101). Shepperson, Kirstin, PA-C  Active Self  amLODipine (NORVASC) 10 MG tablet [id number] Yes TAKE 1 TABLET DAILY Crecencio Mc, MD Taking Active   amoxicillin (AMOXIL) 500 MG capsule [id number] Yes TAKE 4 CAPSULES BY MOUTH 1 HOUR PRIOR TO DENTAL PROCEDURE Crecencio Mc, MD Taking Active   B-D UF III MINI PEN NEEDLES 31G X 5 MM MISC [id number]  SMARTSIG:1 SUB-Q Every Night [provider]  Active   celecoxib (CELEBREX) 100 MG capsule [id number] Yes TAKE 1 CAPSULE TWICE A DAY Crecencio Mc, MD Taking Active   cetirizine (ZYRTEC) 10 MG tablet [id number] Yes Take 10 mg by mouth daily. [provider] Taking Active   Cholecalciferol (VITAMIN D3) 2000 units TABS [id number] Yes Take 1 tablet by mouth daily. [provider] Taking Active   cycloSPORINE (RESTASIS) 0.05 % ophthalmic emulsion [id number] Yes Place 1 drop into both eyes 2 (two) times daily. [provider] Taking Active Self  dicyclomine (BENTYL) 20 MG tablet [id number] Yes TAKE 1 TABLET FOUR TIMES A DAY BEFORE MEALS AND AT BEDTIME Crecencio Mc, MD Taking Active            Med Note Kelby Aline Mar 01, 2020 11:09 AM)    ezetimibe (ZETIA) 10 MG tablet [id number] Yes TAKE 1 TABLET DAILY  Crecencio Mc, MD Taking Active   furosemide (LASIX) 20 MG tablet [id number] Yes TAKE 1 TABLET DAILY Crecencio Mc, MD Taking Active   glucose blood [facility] VERIO) test strip [id number] Yes USE TO TEST BLOOD SUGAR ONCE DAILY Crecencio Mc, MD Taking Active   insulin glargine (LANTUS SOLOSTAR) 100 UNIT/ML Solostar Pen [id number] Yes Inject 20 units daily. Titrate as instructed. Max daily dose 30 units. Crecencio Mc, MD Taking Active   insulin lispro (HUMALOG) 100 UNIT/ML cartridge  [id number] Yes Inject 0.05 mLs (5 Units total) into the skin 3 (three) times daily with meals. Crecencio Mc, MD Taking Active   Lancets 30G MISC [id number] Yes Use to check blood sugars once daily. Crecencio Mc, MD Taking Active   levothyroxine (SYNTHROID) 137 MCG tablet [id number] Yes TAKE 1 TABLET DAILY BEFORE BREAKFAST Crecencio Mc, MD Taking Active   LUMIGAN 0.01 % SOLN [id number] Yes Place 1-2 drops into both eyes at bedtime. Crecencio Mc, MD Taking Active   nystatin cream Royden Purl) [id number] Yes APPLY TOPICALLY TWICE A DAY Crecencio Mc, MD Taking Active   pantoprazole (PROTONIX) 40 MG tablet [id number] Yes TAKE 1 TABLET DAILY Crecencio Mc, MD Taking Active   tiZANidine (ZANAFLEX) 4 MG capsule [id number] Yes TAKE 1 CAPSULE THREE TIMES A DAY AS NEEDED FOR MUSCLE SPASMS Crecencio Mc, MD Taking Active   tolterodine (DETROL LA) 4 MG 24 hr capsule [id number] Yes TAKE 1 CAPSULE TWICE A DAY Crecencio Mc, MD Taking Active           Patient Active Problem List   Diagnosis Date Noted  . OA (osteoarthritis) of finger, right 04/05/2020  . Statin intolerance 03/05/2018  . 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 Candidiasis of skin 06/28/2017  . IBS (irritable bowel syndrome) 06/28/2017  . Screening for colon cancer 12/28/2016  . Idiopathic acute pancreatitis without infection or necrosis   . Diabetes mellitus with complication ([place])   . Dizziness 05/24/2016  . Anxiety, generalized  02/20/2016  . Arthritis   . Acquired hypothyroidism   . History of CVA (cerebrovascular accident)   . Status post bilateral hip replacements   . Status post right knee replacement   . Degenerative lumbar spinal stenosis   . Essential hypertension, benign 07/25/2015  . Preoperative clearance 07/25/2015  . Hyperlipidemia 07/25/2015  . Mixed stress and urge urinary incontinence 04/26/2015  . Diabetic nephropathy associated with type 2 diabetes mellitus ([place]) 01/24/2015  . Morbid obesity ([place]) 01/24/2015  . Vitamin D deficiency 01/24/2015  . 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 Fluad Quad(high Dose 65+) 01/01/2019, 12/31/2019  . Influenza, High Dose Seasonal PF 01/23/2015, 12/04/2015, 12/25/2016, 12/23/2017  . Moderna Sars-Covid-2 Vaccination 04/07/2019, 05/05/2019, 03/02/2020  . Pneumococcal Conjugate-13 04/25/2015  . Pneumococcal Polysaccharide-23 02/05/2018  . Tdap 01/20/2020    Conditions to be addressed/monitored: HTN, HLD and DMII  Care Plan : Medication Management  Updates made by De Hollingshead, RPH-CPP since 06/22/2020 12:00 AM    Problem: Diabetes     Long-Range Goal: Disease Progression Prevention   This Visit's Progress: On track  Recent Progress: On track  Priority: High  Note:   Current Barriers:  . Unable to achieve control of diabetes  . Does not adhere to prescribed medication regimen  Pharmacist Clinical Goal(s):  Marland Kitchen Over the next 90 days, patient will improve control of diabetes as evidenced by improvement in A1c. . Over the next 90 days, patient will adhere to prescribed medication regimen.  Interventions: . 1:1 collaboration with Crecencio Mc, MD regarding development and update of comprehensive plan of care as evidenced by provider attestation and co-signature . Inter-disciplinary care team collaboration (see longitudinal plan of care) . Comprehensive  medication review performed; medication list updated in electronic medical record   Health Maintenance: . Cataract surgery on R eye on Friday, L eye on the Monday after Easter.  Diabetes: . Uncontrolled; current treatment:  Lantus 22 units daily, Humalog 5 units TID PRN higher blood sugar prior to meals . Reports today that she received a call from [facility] that Elenor Legato 2 is not going to be covered by her insurance. They do not accept documentation from a non-physician, so are not taking my notes into consideration. At last visit with Dr. Derrel Nip, mealtime insulin was added. They require her to have been ON multiple daily insulin injections at that visit to meet her insurance requirements.    o Hx Victoza, but was discontinued after an episode of gallstone pancreatitis (recommended by GI providers while hospitalized), will avoid GLP1 moving forward o Hx metformin XR, patient d/c after diarrhea o Unsure if patient would tolerate SGLT2 d/t baseline urinary incontinence  . Patient will follow up with PCP in May as rescheduled (due to upcoming cataract surgeries) and we will fax in the visit note at that time.  Hypertension: . Controlled; current treatment: amlodipine 10 mg daily, furosemide 20 mg daily . Recommended to continue current treatment regimen . Previously discussed benefit of ambulatory monitoring.   Hyperlipidemia: . Uncontrolled; current treatment: ezetimibe 10 mg daily;  Marland Kitchen Medications previously tried: Reports hx muscle aches and GI upset with atorvastatin, rosuvastatin 5 mg daily. Refuses retrial. At this time, not eligible for PCSK9i or Nexletol as she does not have hx ASCVD.  Marland Kitchen Given age and intolerances, continue current regimen at this time  IBS w/ gallbladder removal: . Improved; current regimen: dicyclomine 20 mg QID PRN; taking appropriately 30 minutes before meals and bedtime . Notes that she very infrequently takes a second tablet of dicyclomine, especially if she is planning  on going out somewhere and she is afraid of an episode of diarrhea.  . Have previously discussed impact of gallbladder removal on processing dietary fat.  . Continue current regimen at this time. Re-referral to GI likely appropriate for chronic diarrhea uncontrolled on max dicyclomine doses. Patient plans to discuss w/ PCP at next appointment  GERD: . Controlled per patient report, current treatment: pantoprazole 40 mg BID PRN GERD . Recommended to continue current treatment regimen.  . Could consider de-escalation to PRN famotidine or tums instead. Will discuss moving forward.  . GI referral as above.  Arthritis Pain s/p bilateral hip and knee replacements . Controlled, current treatment regimen: celecoxib 100 mg BID, acetaminophen PRN, tizanidine 4 mg TID PRN (sometimes 3-4 doses daily) . Previously encouraged to continue current regimen with moderation with tizanidine use   OAB/Urinary Incontience: . Moderately well controlled per patient report; taking tolterodine LA 4 mg daily . Previously recommended to continue current regimen.  Over the Counter Medications: . Current regimen: cetirizine 10 mg daily for allergies, Vitamin D 1000 units daily . No interactions noted. Previously recommended to continue current regimen   Patient Goals/Self-Care Activities . Over the next 90 days, patient will:  - take medications as prescribed check blood glucose three times daily using CGM, document, and provide at future appointments  Follow Up Plan: Telephone follow up appointment with care management team member scheduled for: ~ 8 weeks as previously scheduled      Medication Assistance: None required.  Patient affirms current coverage meets needs.  Patient's preferred pharmacy is:  [id number] [place] [address]Phone: [phone]: [phone] [facility] [address] [place]Phone: [phone]: [phone] Uses pill  box? Yes Pt endorses 100% compliance  Follow Up:  Patient agrees to Care Plan and Follow-up.  Plan: Telephone follow up appointment with care management team member scheduled for:  ~ 8 weeks as previously scheduled  [name], PharmD, [place], [place] Clinical Pharmacist [employer] [facility] [employer] [phone]

## 2020-06-28 ENCOUNTER — Other Ambulatory Visit: Payer: Self-pay | Admitting: Internal Medicine

## 2020-06-28 DIAGNOSIS — N3941 Urge incontinence: Secondary | ICD-10-CM

## 2020-07-03 ENCOUNTER — Other Ambulatory Visit: Payer: Self-pay | Admitting: Internal Medicine

## 2020-07-06 ENCOUNTER — Ambulatory Visit: Payer: Medicare Other | Admitting: Internal Medicine

## 2020-07-26 ENCOUNTER — Telehealth: Payer: Self-pay

## 2020-07-26 NOTE — Chronic Care Management (AMB) (Signed)
  Care Management   Note  07/26/2020 Name: VINNIE BOBST MRN: [id number] DOB: Jul 10, 1940  ISALY FASCHING is a 80 y.o. year old female who is a primary care patient of Darrick Huntsman, Mar Daring, MD and is actively engaged with the care management team. I reached out to [name] by phone today to assist with re-scheduling a follow up visit with the Pharmacist  Follow up plan: Unsuccessful telephone outreach attempt made.The care management team will reach out to the patient again over the next 7 days.  If patient returns call to provider office, please advise to call Embedded Care Management Care Guide Penne Lash  at [phone]  Penne Lash, RMA Care Guide, Embedded Care Coordination [place]  [place], [place] [address] Direct Dial: [phone] [email or web address] Website: [email or web address]

## 2020-08-16 ENCOUNTER — Other Ambulatory Visit: Payer: Self-pay

## 2020-08-16 ENCOUNTER — Encounter: Payer: Self-pay | Admitting: Internal Medicine

## 2020-08-16 ENCOUNTER — Other Ambulatory Visit: Payer: Self-pay | Admitting: Internal Medicine

## 2020-08-16 ENCOUNTER — Ambulatory Visit: Payer: Medicare Other | Admitting: Internal Medicine

## 2020-08-16 ENCOUNTER — Telehealth: Payer: Self-pay | Admitting: Internal Medicine

## 2020-08-16 VITALS — BP 140/96 | HR 115 | Temp 98.2°F | Ht 65.0 in | Wt 323.2 lb

## 2020-08-16 DIAGNOSIS — K580 Irritable bowel syndrome with diarrhea: Secondary | ICD-10-CM

## 2020-08-16 DIAGNOSIS — E782 Mixed hyperlipidemia: Secondary | ICD-10-CM | POA: Diagnosis not present

## 2020-08-16 DIAGNOSIS — E1121 Type 2 diabetes mellitus with diabetic nephropathy: Secondary | ICD-10-CM

## 2020-08-16 DIAGNOSIS — R197 Diarrhea, unspecified: Secondary | ICD-10-CM

## 2020-08-16 DIAGNOSIS — E441 Mild protein-calorie malnutrition: Secondary | ICD-10-CM | POA: Diagnosis not present

## 2020-08-16 DIAGNOSIS — E039 Hypothyroidism, unspecified: Secondary | ICD-10-CM

## 2020-08-16 DIAGNOSIS — E118 Type 2 diabetes mellitus with unspecified complications: Secondary | ICD-10-CM

## 2020-08-16 DIAGNOSIS — I10 Essential (primary) hypertension: Secondary | ICD-10-CM

## 2020-08-16 DIAGNOSIS — Z9889 Other specified postprocedural states: Secondary | ICD-10-CM

## 2020-08-16 LAB — COMPREHENSIVE METABOLIC PANEL
ALT: 19 U/L (ref 0–35)
AST: 24 U/L (ref 0–37)
Albumin: 4.2 g/dL (ref 3.5–5.2)
Alkaline Phosphatase: 76 U/L (ref 39–117)
BUN: 16 mg/dL (ref 6–23)
CO2: 27 mEq/L (ref 19–32)
Calcium: 10.1 mg/dL (ref 8.4–10.5)
Chloride: 99 mEq/L (ref 96–112)
Creatinine, Ser: 0.84 mg/dL (ref 0.40–1.20)
GFR: 66.01 mL/min (ref >60.00)
Glucose, Bld: 150 mg/dL — ABNORMAL HIGH (ref 70–99)
Potassium: 4.3 mEq/L (ref 3.5–5.1)
Sodium: 137 mEq/L (ref 135–145)
Total Bilirubin: 0.4 mg/dL (ref 0.2–1.2)
Total Protein: 7.4 g/dL (ref 6.0–8.3)

## 2020-08-16 LAB — HEMOGLOBIN A1C: Hgb A1c MFr Bld: 8.4 % — ABNORMAL HIGH (ref 4.6–6.5)

## 2020-08-16 LAB — LIPID PANEL
Cholesterol: 231 mg/dL — ABNORMAL HIGH (ref 0–200)
HDL: 46.9 mg/dL (ref >39.00)
NonHDL: 183.82
Total CHOL/HDL Ratio: 5
Triglycerides: 234 mg/dL — ABNORMAL HIGH (ref 0.0–149.0)
VLDL: 46.8 mg/dL — ABNORMAL HIGH (ref 0.0–40.0)

## 2020-08-16 LAB — TSH: TSH: 2.69 u[IU]/mL (ref 0.35–4.50)

## 2020-08-16 LAB — LDL CHOLESTEROL, DIRECT: Direct LDL: 162 mg/dL

## 2020-08-16 LAB — MAGNESIUM: Magnesium: 1.7 mg/dL (ref 1.5–2.5)

## 2020-08-16 NOTE — Telephone Encounter (Signed)
Pt needs a refill tiZANidine (ZANAFLEX) 4 MG capsule sent [facility]

## 2020-08-16 NOTE — Progress Notes (Signed)
Subjective:  Patient ID: Bonnie Hinton, female    DOB: 1940/09/25  Age: 80 y.o. MRN: [id number]  CC: The primary encounter diagnosis was Mild protein-calorie malnutrition (HCC). Diagnoses of Morbid obesity (HCC), Mixed hyperlipidemia, Acquired hypothyroidism, Diabetes mellitus with complication (HCC), Diabetic nephropathy associated with type 2 diabetes mellitus (HCC), Essential hypertension, benign, Diarrhea following gastrointestinal surgery, and Irritable bowel syndrome with diarrhea were also pertinent to this visit.  HPI Bonnie Gashlizabeth A Huss presents for follow up on chronic issues including type 2 DM obesity,  Hypothyroid and OA   This visit occurred during the SARS-CoV-2 public health emergency.  Safety protocols were in place, including screening questions prior to the visit, additional usage of staff PPE, and extensive cleaning of exam room while observing appropriate ]contact time as indicated for disinfecting solutions.   1)diarrhea:  Chronic, has  loose stools after nearly every meal.  Sometimes occurs within 30 minutes.  Not waking her up at night,  But occurs often after dinner for several hours up until bedtime.  Afraid to eat so she skips meals.   She states that she has lost 3 lbs by home scales during the last week.  Not sure if all foods cause it because she has not kept a food diary. Always happens after eating salads.   The diarrhea is sporadic and does not occur predictably.  One week she can tolerate a certain food,  The next week she can't eat the same food.  Rarely has a solid stool ,  Averages one  per week .     2) DM:  Taking only basal insulin.  Not taking humalog or metformin (caused diarrhea) .  Fasting was 130 this morning but fasted yesterday.  Sugars are 180 to 200 post prandially .  Has not received the CBG monitor becuase she told the insurance RN she was not using insulin more than once daily . Statin intolerant   3) HTN:  BP elevated today .  Not checking  at home. Taking amlodipine only . Had hyperkalemia with lisinopril     Outpatient Medications Prior to Visit  Medication Sig Dispense Refill  . acetaminophen (TYLENOL) 325 MG tablet Take 2 tablets (650 mg total) by mouth every 6 (six) hours as needed for mild pain (or Fever >/= 101).    Marland Kitchen. amLODipine (NORVASC) 10 MG tablet TAKE 1 TABLET DAILY 90 tablet 3  . amoxicillin (AMOXIL) 500 MG capsule TAKE 4 CAPSULES BY MOUTH 1 HOUR PRIOR TO DENTAL PROCEDURE 20 capsule 2  . B-D UF III MINI PEN NEEDLES 31G X 5 MM MISC SMARTSIG:1 SUB-Q Every Night    . celecoxib (CELEBREX) 100 MG capsule TAKE 1 CAPSULE TWICE A DAY 180 capsule 3  . cetirizine (ZYRTEC) 10 MG tablet Take 10 mg by mouth daily.    . Cholecalciferol (VITAMIN D3) 2000 units TABS Take 1 tablet by mouth daily.    . cycloSPORINE (RESTASIS) 0.05 % ophthalmic emulsion Place 1 drop into both eyes 2 (two) times daily.    Marland Kitchen. dicyclomine (BENTYL) 20 MG tablet TAKE 1 TABLET FOUR TIMES A DAY BEFORE MEALS AND AT BEDTIME 270 tablet 4  . ezetimibe (ZETIA) 10 MG tablet TAKE 1 TABLET DAILY 90 tablet 3  . furosemide (LASIX) 20 MG tablet TAKE 1 TABLET BY MOUTH DAILY AS NEEDED. FOR FLUID RETENTION NOT MORE THAN EVERY OTHER DAY 45 tablet 1  . glucose blood (ONETOUCH VERIO) test strip USE TO TEST BLOOD SUGAR ONCE DAILY 100 each 3  .  insulin glargine (LANTUS SOLOSTAR) 100 UNIT/ML Solostar Pen Inject 20 units daily. Titrate as instructed. Max daily dose 30 units. 15 mL 3  . Lancets 30G MISC Use to check blood sugars once daily. 100 each 3  . levothyroxine (SYNTHROID) 137 MCG tablet TAKE 1 TABLET DAILY BEFORE BREAKFAST 90 tablet 3  . LUMIGAN 0.01 % SOLN Place 1-2 drops into both eyes at bedtime. 5 mL 0  . nystatin cream (MYCOSTATIN) APPLY TOPICALLY TWICE A DAY 90 g 7  . ofloxacin (OCUFLOX) 0.3 % ophthalmic solution Place 1 drop into the left eye 4 (four) times daily.    . pantoprazole (PROTONIX) 40 MG tablet TAKE 1 TABLET DAILY 90 tablet 3  . prednisoLONE acetate  (PRED FORTE) 1 % ophthalmic suspension PLEASE SEE ATTACHED FOR DETAILED DIRECTIONS    . tolterodine (DETROL LA) 4 MG 24 hr capsule TAKE 1 CAPSULE TWICE A DAY 180 capsule 3  . insulin lispro (HUMALOG) 100 UNIT/ML cartridge Inject 0.05 mLs (5 Units total) into the skin 3 (three) times daily with meals. 15 mL PRN  . tiZANidine (ZANAFLEX) 4 MG capsule TAKE 1 CAPSULE THREE TIMES A DAY AS NEEDED FOR MUSCLE SPASMS 180 capsule 5   No facility-administered medications prior to visit.    Review of Systems;  Patient denies headache, fevers, malaise, unintentional weight loss, skin rash, eye pain, sinus congestion and sinus pain, sore throat, dysphagia,  hemoptysis , cough, dyspnea, wheezing, chest pain, palpitations, orthopnea, edema, abdominal pain, nausea, melena,  constipation, flank pain, dysuria, hematuria, urinary  Frequency, nocturia, numbness, tingling, seizures,  Focal weakness, Loss of consciousness,  Tremor, insomnia, depression, anxiety, and suicidal ideation.      Objective:  BP (!) 140/96 (BP Location: Left Arm, Patient Position: Sitting)   Pulse (!) 115   Temp 98.2 F (36.8 C)   Ht  (1.651 m)   Wt (!) 323 lb 4 oz (146.6 kg)   SpO2 97%   BMI 53.79 kg/m   BP Readings from Last 3 Encounters:  08/16/20 (!) 140/96  04/05/20 124/66  12/31/19 (!) 150/78    Wt Readings from Last 3 Encounters:  08/16/20 (!) 323 lb 4 oz (146.6 kg)  04/05/20 (!) 309 lb (140.2 kg)  12/31/19 (!) 323 lb (146.5 kg)    General appearance: alert, cooperative and appears stated age Ears: normal TM's and external ear canals both ears Throat: lips, mucosa, and tongue normal; teeth and gums normal Neck: no adenopathy, no carotid bruit, supple, symmetrical, trachea midline and thyroid not enlarged, symmetric, no tenderness/mass/nodules Back: symmetric, no curvature. ROM normal. No CVA tenderness. Lungs: clear to auscultation bilaterally Heart: regular rate and rhythm, S1, S2 normal, no murmur, click,  rub or gallop Abdomen: soft, non-tender; bowel sounds normal; no masses,  no organomegaly Pulses: 2+ and symmetric Skin: Skin color, texture, turgor normal. No rashes or lesions Lymph nodes: Cervical, supraclavicular, and axillary nodes normal.  Lab Results  Component Value Date   HGBA1C 8.4 (H) 08/16/2020   HGBA1C 8.3 (A) 04/05/2020   HGBA1C 10.2 Repeated and verified X2. (H) 12/31/2019    Lab Results  Component Value Date   CREATININE 0.84 08/16/2020   CREATININE 0.77 04/05/2020   CREATININE 1.01 12/31/2019    Lab Results  Component Value Date   WBC 8.4 06/10/2018   HGB 14.9 06/10/2018   HCT 45.1 06/10/2018   PLT 306.0 06/10/2018   GLUCOSE 150 (H) 08/16/2020   CHOL 231 (H) 08/16/2020   TRIG 234.0 (H) 08/16/2020   HDL  46.90 08/16/2020   LDLDIRECT 162.0 08/16/2020   LDLCALC 210 (H) 12/25/2016   ALT 19 08/16/2020   AST 24 08/16/2020   NA 137 08/16/2020   K 4.3 08/16/2020   CL 99 08/16/2020   CREATININE 0.84 08/16/2020   BUN 16 08/16/2020   CO2 27 08/16/2020   TSH 2.69 08/16/2020   INR 1.37 09/04/2016   HGBA1C 8.4 (H) 08/16/2020   MICROALBUR 15.6 (H) 12/31/2019    DG Chest 2 View  Result Date: 09/04/2016 CLINICAL DATA:  Preop chest exam, bronchitis. EXAM: CHEST  2 VIEW COMPARISON:  None. FINDINGS: The heart size and mediastinal contours are within normal limits. Low lung volumes with mild increase in interstitial prominence suspicious for chronic bronchitic change. No pneumonic consolidation, effusion or pneumothorax. Mild degenerative change along the dorsal spine. IMPRESSION: Mild interstitial prominence bilaterally which may reflect chronic bronchitic change. No pulmonary consolidations to suggest pneumonia. Electronically Signed   By: Tollie Eth M.D.   On: 09/04/2016 13:51   MR ABDOMEN MRCP WO CONTRAST  Result Date: 09/04/2016 CLINICAL DATA:  Right upper quadrant abdominal pain. Pancreatitis with elevated liver function studies. EXAM: MRI ABDOMEN WITHOUT  CONTRAST  (INCLUDING MRCP) TECHNIQUE: Multiplanar multisequence MR imaging of the abdomen was performed. Heavily T2-weighted images of the biliary and pancreatic ducts were obtained, and three-dimensional MRCP images were rendered by post processing. COMPARISON:  Ultrasound today and 04/06/2016. FINDINGS: Despite efforts by the technologist and patient, moderate motion artifact is present on today's exam and could not be eliminated. This reduces exam sensitivity and specificity. Lower chest: The visualized lower chest appears remarkable. No significant pleural effusion. Hepatobiliary: The liver demonstrates loss of signal on the gradient echo opposed phase images consistent with steatosis. No focal lesions are identified. Multiple small gallstones are present. There is no gallbladder wall thickening or surrounding inflammation. The ERCP images are degraded by motion. No significant biliary dilatation. No definite choledocholithiasis. Pancreas: Fatty replaced with mild surrounding edema. No focal fluid collection or ductal dilatation identified. Spleen: Normal in size without focal abnormality. Adrenals/Urinary Tract: Both adrenal glands appear normal. Tiny cyst in the lower pole of the left kidney. The right kidney appears normal. No hydronephrosis. Stomach/Bowel: No evidence of bowel wall thickening, distention or surrounding inflammatory change. Vascular/Lymphatic: Small lymph nodes in the porta hepatis, likely reactive. No enlarged retroperitoneal lymph nodes. No significant vascular findings on noncontrast imaging identified. Other: No ascites. Musculoskeletal: No acute or significant osseous findings. IMPRESSION: 1. Motion artifact results in suboptimal evaluation of the biliary system. Cholelithiasis without evidence of cholecystitis, significant biliary dilatation or definite choledocholithiasis. 2. Hepatic steatosis. 3. Peripancreatic edema consistent with pancreatitis. No focal fluid collection.  Electronically Signed   By: Carey Bullocks M.D.   On: 09/04/2016 13:53   MR 3D Recon At Scanner  Result Date: 09/04/2016 CLINICAL DATA:  Right upper quadrant abdominal pain. Pancreatitis with elevated liver function studies. EXAM: MRI ABDOMEN WITHOUT CONTRAST  (INCLUDING MRCP) TECHNIQUE: Multiplanar multisequence MR imaging of the abdomen was performed. Heavily T2-weighted images of the biliary and pancreatic ducts were obtained, and three-dimensional MRCP images were rendered by post processing. COMPARISON:  Ultrasound today and 04/06/2016. FINDINGS: Despite efforts by the technologist and patient, moderate motion artifact is present on today's exam and could not be eliminated. This reduces exam sensitivity and specificity. Lower chest: The visualized lower chest appears remarkable. No significant pleural effusion. Hepatobiliary: The liver demonstrates loss of signal on the gradient echo opposed phase images consistent with steatosis. No focal lesions are  identified. Multiple small gallstones are present. There is no gallbladder wall thickening or surrounding inflammation. The ERCP images are degraded by motion. No significant biliary dilatation. No definite choledocholithiasis. Pancreas: Fatty replaced with mild surrounding edema. No focal fluid collection or ductal dilatation identified. Spleen: Normal in size without focal abnormality. Adrenals/Urinary Tract: Both adrenal glands appear normal. Tiny cyst in the lower pole of the left kidney. The right kidney appears normal. No hydronephrosis. Stomach/Bowel: No evidence of bowel wall thickening, distention or surrounding inflammatory change. Vascular/Lymphatic: Small lymph nodes in the porta hepatis, likely reactive. No enlarged retroperitoneal lymph nodes. No significant vascular findings on noncontrast imaging identified. Other: No ascites. Musculoskeletal: No acute or significant osseous findings. IMPRESSION: 1. Motion artifact results in suboptimal  evaluation of the biliary system. Cholelithiasis without evidence of cholecystitis, significant biliary dilatation or definite choledocholithiasis. 2. Hepatic steatosis. 3. Peripancreatic edema consistent with pancreatitis. No focal fluid collection. Electronically Signed   By: Carey Bullocks M.D.   On: 09/04/2016 13:53   US Abdomen Limited RUQ  Result Date: 09/04/2016 CLINICAL DATA:  Right upper quadrant pain EXAM: ULTRASOUND ABDOMEN LIMITED RIGHT UPPER QUADRANT COMPARISON:  Ultrasound abdomen 04/06/2016 FINDINGS: Gallbladder: Cholelithiasis. Largest calculus 13 mm. Negative for gallbladder wall thickening. Negative for sonographic Murphy sign Common bile duct: Diameter: 7 mm Liver: Increased echogenicity of the liver without focal liver lesion. IMPRESSION: Cholelithiasis without evidence of cholecystitis Common bile duct upper normal Echogenic liver compatible with fatty infiltration. Electronically Signed   By: Marlan Palau M.D.   On: 09/04/2016 10:42    Assessment & Plan:   Problem List Items Addressed This Visit      Unprioritized   Acquired hypothyroidism    Thyroid is WNL on  137 mcg daily    Lab Results  Component Value Date   TSH 2.69 08/16/2020         Relevant Orders   TSH (Completed)   RESOLVED: Diabetes mellitus with complication (HCC)   Relevant Medications   insulin lispro (HUMALOG) 100 UNIT/ML cartridge   Other Relevant Orders   Hemoglobin A1c (Completed)   Comprehensive metabolic panel (Completed)   NM Gastric Emptying   Diabetic nephropathy associated with type 2 diabetes mellitus (HCC)    Uncontrolled due to inadequate regimen , limited by lack of data and inconsistent diet due to recurrent diarrhea. Adding meal time insulin to basal so CBG can be utilized for better data collection .  Will advise patient to simplify diet for a week if the addition of imodium has not helped.  She has a history of hyperkalemia and drop in GFR with ACE inhibitor trial  Lab  Results  Component Value Date   HGBA1C 8.4 (H) 08/16/2020   Lab Results  Component Value Date   MICROALBUR 15.6 (H) 12/31/2019   MICROALBUR 6.0 (H) 02/05/2018           Relevant Medications   insulin lispro (HUMALOG) 100 UNIT/ML cartridge   Other Relevant Orders   US Renal Artery Stenosis   Diarrhea following gastrointestinal surgery    Present since 2018,  Now worse. DIAGNOSIS of IBS is in  QUESTION given failure to resolve after 3 years.  Given her uncontrolled diabetes may have gastroparesis , SB overgrowth.    Given her history of gallstone pancreatitis,  May be due to pancreatic insufficiency.  Gastric emptying study and GI referral made Will give empiric trial of Imodium       Relevant Orders   NM Gastric Emptying   Ambulatory  referral to Gastroenterology   Essential hypertension, benign    Not at  Goal on maximal dose of amlodipine and 20 mg furosemide. ACE/ARB are C/i due to history of hyperkalemia/ with prior trial of lisinopril.       Relevant Orders   US Renal Artery Stenosis   Hyperlipidemia   Relevant Orders   Lipid panel (Completed)   IBS (irritable bowel syndrome)    DIAGNOSIS of IBS is in  QUESTION given failure to resolve after 3 years.  Given her uncontrolled diabetes may have gastroparesis , SB overgrowth.  Gastric emptying study and GI referral made       Relevant Medications   lipase/protease/amylase (CREON) 36000 UNITS CPEP capsule   Mild protein-calorie malnutrition (HCC) - Primary   Relevant Orders   Magnesium (Completed)   Morbid obesity (HCC)   Relevant Medications   insulin lispro (HUMALOG) 100 UNIT/ML cartridge    A total of 40 minutes was spent evaluating patient on day of presentation  more than half of which was spent in reviewing past surgrical procedures and imaging studies. counseling patient on the management of fasting and post prandial blood sugars  reviewing and explaining recent labs and imaging studies done, and coordination of  care.  I have discontinued Shyne A. Eckert's HumaLOG. I am also having her start on HumaLOG and lipase/protease/amylase. Additionally, I am having her maintain her acetaminophen, cycloSPORINE, Vitamin D3, amoxicillin, dicyclomine, nystatin cream, tolterodine, pantoprazole, Lancets 30G, OneTouch Verio, cetirizine, B-D UF III MINI PEN NEEDLES, Lantus SoloStar, Lumigan, celecoxib, ezetimibe, levothyroxine, furosemide, amLODipine, ofloxacin, and prednisoLONE acetate.  Meds ordered this encounter  Medications  . insulin lispro (HUMALOG) 100 UNIT/ML cartridge    Sig: Inject 0.05 mLs (5 Units total) into the skin 3 (three) times daily with meals.    Dispense:  15 mL    Refill:  11  . lipase/protease/amylase (CREON) 36000 UNITS CPEP capsule    Sig: Take 2 capsules (72,000 Units total) by mouth 3 (three) times daily with meals. May also take 1 capsule (36,000 Units total) as needed (with snacks).    Dispense:  240 capsule    Refill:  11    Medications Discontinued During This Encounter  Medication Reason  . insulin lispro (HUMALOG) 100 UNIT/ML cartridge     Follow-up: Return in about 4 weeks (around 09/13/2020).   Sherlene Shams, MD

## 2020-08-16 NOTE — Patient Instructions (Addendum)
Try taking 1/2 Imodium tablet 30 minutes before each meal ALONG WITH ONE BENTYL  IF DIARRHEA DOES NOT STOP AFTER ONE DAY, INCREASE IMODIUM TO A FULL TABLET     CHECK BLOOD SUGARS TWO TIMES DAILY:  1) FASTING 2) POSTPRANDIAL (2 HOURS AFTER A MEAL)   CHECK BLOOD PRESSURE ONCE DAILY FOR ONE WEEK    SEND OR MAIL ME READINGS OF BOTH     Gastric emptying study will be ordered , along with a GI referral .

## 2020-08-18 ENCOUNTER — Ambulatory Visit: Payer: Medicare Other | Admitting: Internal Medicine

## 2020-08-18 ENCOUNTER — Encounter: Payer: Self-pay | Admitting: Internal Medicine

## 2020-08-18 ENCOUNTER — Telehealth: Payer: Self-pay

## 2020-08-18 MED ORDER — PANCRELIPASE (LIP-PROT-AMYL) 36000-114000 UNITS PO CPEP: ORAL_CAPSULE | ORAL | 11 refills | Status: DC

## 2020-08-18 MED ORDER — HUMALOG 100 UNIT/ML ~~LOC~~ SOCT: 5.0000 [IU] | Freq: Three times a day (TID) | SUBCUTANEOUS | 11 refills | Status: DC

## 2020-08-18 NOTE — Assessment & Plan Note (Addendum)
Uncontrolled due to inadequate regimen , limited by lack of data and inconsistent diet due to recurrent diarrhea. Adding meal time insulin to basal so CBG can be utilized for better data collection .  Will advise patient to simplify diet for a week if the addition of imodium has not helped.  She has a history of hyperkalemia and drop in GFR with ACE inhibitor trial  Lab Results  Component Value Date   HGBA1C 8.4 (H) 08/16/2020   Lab Results  Component Value Date   MICROALBUR 15.6 (H) 12/31/2019   MICROALBUR 6.0 (H) 02/05/2018

## 2020-08-18 NOTE — Assessment & Plan Note (Addendum)
Present since 2018,  Now worse. DIAGNOSIS of IBS is in  QUESTION given failure to resolve after 3 years.  Given her uncontrolled diabetes may have gastroparesis , SB overgrowth.    Given her history of gallstone pancreatitis,  May be due to pancreatic insufficiency.  Gastric emptying study and GI referral made Will give empiric trial of Imodium

## 2020-08-18 NOTE — Telephone Encounter (Signed)
See result note message 

## 2020-08-18 NOTE — Assessment & Plan Note (Signed)
Thyroid is WNL on  137 mcg daily    Lab Results  Component Value Date   TSH 2.69 08/16/2020

## 2020-08-18 NOTE — Assessment & Plan Note (Addendum)
Not at  Goal on maximal dose of amlodipine and 20 mg furosemide. ACE/ARB are C/i due to history of hyperkalemia/ with prior trial of lisinopril.

## 2020-08-18 NOTE — Assessment & Plan Note (Signed)
DIAGNOSIS of IBS is in  QUESTION given failure to resolve after 3 years.  Given her uncontrolled diabetes may have gastroparesis , SB overgrowth.  Gastric emptying study and GI referral made

## 2020-08-18 NOTE — Telephone Encounter (Signed)
Per Dr. Darrick Huntsman, patient needs sooner f/u given elevated A1c.   Please schedule IN OFFICE with me in the next 4 weeks.   Thanks!  Catie

## 2020-08-18 NOTE — Telephone Encounter (Signed)
Patient has been rescheduled for 09/07/2020

## 2020-08-18 NOTE — Telephone Encounter (Signed)
Pt is returning call about labs

## 2020-08-22 ENCOUNTER — Telehealth: Payer: Medicare Other

## 2020-08-22 ENCOUNTER — Ambulatory Visit: Payer: Medicare Other | Admitting: Internal Medicine

## 2020-08-24 ENCOUNTER — Telehealth: Payer: Self-pay | Admitting: Internal Medicine

## 2020-08-24 NOTE — Telephone Encounter (Signed)
You can cancel the study.  And im sorry she wasted your time

## 2020-08-24 NOTE — Telephone Encounter (Signed)
I called pt to schedule NM gastric pt states she is not having diarrhea anymore she started to eat more vegetables. Pt stated she was eating things she should not have been eating. In the morning she has a normal bowel movement. Pt would like to hold off on the NM gastric. Pt states she's feeling better she is eating three times a day. Please advise and thank you!  Sugar:     BP: 08/18/2020  158    08/19/2020   185   159/77   Pulse 78 08/20/2020   174   150/73  80 08/21/2020   160   169/80  90 08/22/2020   171   173/81  71  08/23/2020   178  After sub  165/79  71 08/24/2020   151

## 2020-08-25 NOTE — Telephone Encounter (Signed)
Study has been canceled.

## 2020-08-25 NOTE — Addendum Note (Signed)
Addended by: Sandy Salaam on: 08/25/2020 08:21 AM   Modules accepted: Orders

## 2020-09-05 ENCOUNTER — Telehealth: Payer: Self-pay

## 2020-09-05 NOTE — Telephone Encounter (Signed)
Pt called and wants to cancel her appt on 6/16 but would like a call back from you.

## 2020-09-06 ENCOUNTER — Ambulatory Visit (INDEPENDENT_AMBULATORY_CARE_PROVIDER_SITE_OTHER): Payer: Medicare Other | Admitting: Pharmacist

## 2020-09-06 DIAGNOSIS — I10 Essential (primary) hypertension: Secondary | ICD-10-CM | POA: Diagnosis not present

## 2020-09-06 DIAGNOSIS — T466X5A Adverse effect of antihyperlipidemic and antiarteriosclerotic drugs, initial encounter: Secondary | ICD-10-CM

## 2020-09-06 DIAGNOSIS — E782 Mixed hyperlipidemia: Secondary | ICD-10-CM | POA: Diagnosis not present

## 2020-09-06 DIAGNOSIS — E118 Type 2 diabetes mellitus with unspecified complications: Secondary | ICD-10-CM

## 2020-09-06 DIAGNOSIS — Z8673 Personal history of transient ischemic attack (TIA), and cerebral infarction without residual deficits: Secondary | ICD-10-CM

## 2020-09-06 NOTE — Chronic Care Management (AMB) (Addendum)
Chronic Care Management Pharmacy Note  09/07/2020 Name:  Bonnie Hinton MRN:  [id number] DOB:  03-27-1940   Subjective: Bonnie Hinton is an 80 y.o. year old female who is a primary patient of Tullo, Aris Everts, MD.  The CCM team was consulted for assistance with disease management and care coordination needs.    Engaged with patient by telephone for  patient question  in response to provider referral for pharmacy case management and/or care coordination services.   Consent to Services:  The patient was given information about Chronic Care Management services, agreed to services, and gave verbal consent prior to initiation of services.  Please see initial visit note for detailed documentation.   Patient Care Team: Crecencio Mc, MD as PCP - General (Internal Medicine) De Hollingshead, RPH-CPP (Pharmacist)  Objective:  Lab Results  Component Value Date   CREATININE 0.84 08/16/2020   CREATININE 0.77 04/05/2020   CREATININE 1.01 12/31/2019    Lab Results  Component Value Date   HGBA1C 8.4 (H) 08/16/2020   Last diabetic Eye exam:  Lab Results  Component Value Date/Time   HMDIABEYEEXA No Retinopathy 05/19/2020 12:00 AM    Last diabetic Foot exam: No results found for: HMDIABFOOTEX      Component Value Date/Time   CHOL 231 (H) 08/16/2020 1150   TRIG 234.0 (H) 08/16/2020 1150   HDL 46.90 08/16/2020 1150   CHOLHDL 5 08/16/2020 1150   VLDL 46.8 (H) 08/16/2020 1150   LDLCALC 210 (H) 12/25/2016 1005   LDLDIRECT 162.0 08/16/2020 1150    Hepatic Function Latest Ref Rng & Units 08/16/2020 04/05/2020 12/31/2019  Total Protein 6.0 - 8.3 g/dL 7.4 7.3 8.0  Albumin 3.5 - 5.2 g/dL 4.2 4.2 4.5  AST 0 - 37 U/L 24 18 32  ALT 0 - 35 U/L _0 Alk Phosphatase 39 - 117 U/L 76 60 77  Total Bilirubin 0.2 - 1.2 mg/dL 0.4 0.4 0.6  Bilirubin, Direct 0.0 - 0.3 mg/dL - - -    Lab Results  Component Value Date/Time   TSH 2.69 08/16/2020 11:50 AM   TSH 13.46 (H)  12/31/2019 09:13 AM   FREET4 0.21 (L) 03/26/2017 10:21 AM    CBC Latest Ref Rng & Units 06/10/2018 09/24/2016 09/13/2016  WBC 4.0 - 10.5 K/uL 8.4 8.9 11.1(H)  Hemoglobin 12.0 - 15.0 g/dL 14.9 13.1 10.6(L)  Hematocrit 36.0 - 46.0 % 45.1 41 33.6(L)  Platelets 150.0 - 400.0 K/uL 306.0 452(A) 315    Lab Results  Component Value Date/Time   VD25OH 40.57 12/31/2019 09:13 AM   VD25OH 25.21 (L) 12/25/2016 10:05 AM    Clinical ASCVD: No  The 10-year ASCVD risk score Mikey Bussing DC Jr., et al., 2013) is: 56.5%   Values used to calculate the score:     Age: 20 years     Sex: Female     Is Non-Hispanic African American: No     Diabetic: Yes     Tobacco smoker: No     Systolic Blood Pressure: 185 mmHg     Is BP treated: Yes     HDL Cholesterol: 46.9 mg/dL     Total Cholesterol: 231 mg/dL     Social History   Tobacco Use  Smoking Status Never  Smokeless Tobacco Never   BP Readings from Last 3 Encounters:  08/16/20 (!) 140/96  04/05/20 124/66  12/31/19 (!) 150/78   Pulse Readings from Last 3 Encounters:  08/16/20 (!) 115  04/05/20 75  12/31/19 97   Wt Readings from Last 3 Encounters:  08/16/20 (!) 323 lb 4 oz (146.6 kg)  04/05/20 (!) 309 lb (140.2 kg)  12/31/19 (!) 323 lb (146.5 kg)    Assessment: Review of patient past medical history, allergies, medications, health status, including review of consultants reports, laboratory and other test data, was performed as part of comprehensive evaluation and provision of chronic care management services.   SDOH:  (Social Determinants of Health) assessments and interventions performed:  SDOH Interventions    Flowsheet Row Most Recent Value  SDOH Interventions   Financial Strain Interventions Other (Comment)       CCM Care Plan  Allergies  Allergen Reactions   Erythromycin Rash   Fentanyl Other (See Comments)    Duragesic-25 Duragesic-25   Iodinated Diagnostic Agents Swelling and Rash   Atorvastatin     "stomach problems"    Metformin And Related Other (See Comments)    Diarrhea even w/ XR metformin   Victoza [Liraglutide]     Suspected to be the cause of gallstone pancreatitis June 2018   Latex Itching and Rash   Lisinopril Other (See Comments)    Hyperkalemia    Other Rash    Paper tape, possibly adhesive tape PAPER TAPE     Medications Reviewed Today     Reviewed by Ezequiel Ganser, CMA (Certified Medical Assistant) on 08/16/20 at [place] List Status: <None>   Medication Order Taking? Sig Documenting Provider Last Dose Status Informant  acetaminophen (TYLENOL) 325 MG tablet [id number] Yes Take 2 tablets (650 mg total) by mouth every 6 (six) hours as needed for mild pain (or Fever >/= 101). Shepperson, Kirstin, PA-C Taking Active Self  amLODipine (NORVASC) 10 MG tablet [id number] Yes TAKE 1 TABLET DAILY Crecencio Mc, MD Taking Active   amoxicillin (AMOXIL) 500 MG capsule [id number] Yes TAKE 4 CAPSULES BY MOUTH 1 HOUR PRIOR TO DENTAL PROCEDURE Crecencio Mc, MD Taking Active   B-D UF III MINI PEN NEEDLES 31G X 5 MM MISC [id number] Yes SMARTSIG:1 SUB-Q Every Night [provider] Taking Active   celecoxib (CELEBREX) 100 MG capsule [id number] Yes TAKE 1 CAPSULE TWICE A DAY Crecencio Mc, MD Taking Active   cetirizine (ZYRTEC) 10 MG tablet [id number] Yes Take 10 mg by mouth daily. [provider] Taking Active   Cholecalciferol (VITAMIN D3) 2000 units TABS [id number] Yes Take 1 tablet by mouth daily. [provider] Taking Active   cycloSPORINE (RESTASIS) 0.05 % ophthalmic emulsion [id number] Yes Place 1 drop into both eyes 2 (two) times daily. [provider] Taking Active Self  dicyclomine (BENTYL) 20 MG tablet [id number] Yes TAKE 1 TABLET FOUR TIMES A DAY BEFORE MEALS AND AT BEDTIME Crecencio Mc, MD Taking Active            Med Note Kelby Aline Mar 01, 2020 11:09 AM)    ezetimibe (ZETIA) 10 MG tablet [id number] Yes TAKE 1 TABLET DAILY Crecencio Mc,  MD Taking Active   furosemide (LASIX) 20 MG tablet [id number] Yes TAKE 1 TABLET BY MOUTH DAILY AS NEEDED. FOR FLUID RETENTION NOT MORE THAN EVERY OTHER DAY Crecencio Mc, MD Taking Active   glucose blood [facility] VERIO) test strip [id number] Yes USE TO TEST BLOOD SUGAR ONCE DAILY Crecencio Mc, MD Taking Active   insulin glargine (LANTUS SOLOSTAR) 100 UNIT/ML Solostar Pen [id number] Yes Inject 20 units daily. Titrate as instructed. Max daily dose 30 units. Deborra Medina  L, MD Taking Active   insulin lispro (HUMALOG) 100 UNIT/ML cartridge [id number] Yes Inject 0.05 mLs (5 Units total) into the skin 3 (three) times daily with meals. Crecencio Mc, MD Taking Active   Lancets 30G MISC [id number] Yes Use to check blood sugars once daily. Crecencio Mc, MD Taking Active   levothyroxine (SYNTHROID) 137 MCG tablet [id number] Yes TAKE 1 TABLET DAILY BEFORE BREAKFAST Crecencio Mc, MD Taking Active   LUMIGAN 0.01 % SOLN [id number] Yes Place 1-2 drops into both eyes at bedtime. Crecencio Mc, MD Taking Active   nystatin cream Royden Purl) [id number] Yes APPLY TOPICALLY TWICE A DAY Crecencio Mc, MD Taking Active   ofloxacin (OCUFLOX) 0.3 % ophthalmic solution [id number] Yes Place 1 drop into the left eye 4 (four) times daily. [provider] Taking Active   pantoprazole (PROTONIX) 40 MG tablet [id number] Yes TAKE 1 TABLET DAILY Crecencio Mc, MD Taking Active   prednisoLONE acetate (PRED FORTE) 1 % ophthalmic suspension [id number] Yes PLEASE SEE ATTACHED FOR DETAILED DIRECTIONS [provider] Taking Active   tiZANidine (ZANAFLEX) 4 MG capsule [id number] Yes TAKE 1 CAPSULE THREE TIMES A DAY AS NEEDED FOR MUSCLE SPASMS Crecencio Mc, MD Taking Active   tolterodine (DETROL LA) 4 MG 24 hr capsule [id number] Yes TAKE 1 CAPSULE TWICE A DAY Crecencio Mc, MD Taking Active             Patient Active Problem List   Diagnosis Date Noted   Mild protein-calorie malnutrition ([place])  08/16/2020   OA (osteoarthritis) of finger, right 04/05/2020   Statin intolerance 03/05/2018   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   Candidiasis of skin 06/28/2017   IBS (irritable bowel syndrome) 06/28/2017   Screening for colon cancer 12/28/2016   Idiopathic acute pancreatitis without infection or necrosis    Diarrhea following gastrointestinal surgery 09/04/2016   Dizziness 05/24/2016   Anxiety, generalized 02/20/2016   Arthritis    Acquired hypothyroidism    History of CVA (cerebrovascular accident)    Status post bilateral hip replacements    Status post right knee replacement    Degenerative lumbar spinal stenosis    Essential hypertension, benign 07/25/2015   Preoperative clearance 07/25/2015   Hyperlipidemia 07/25/2015   Mixed stress and urge urinary incontinence 04/26/2015   Diabetic nephropathy associated with type 2 diabetes mellitus ([place]) 01/24/2015   Morbid obesity ([place]) 01/24/2015   Vitamin D deficiency 01/24/2015   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, right 09/23/2014    Immunization History  Administered Date(s) Administered   Fluad Quad(high Dose 65+) 01/01/2019, 12/31/2019   Influenza, High Dose Seasonal PF 01/23/2015, 12/04/2015, 12/25/2016, 12/23/2017   Moderna Sars-Covid-2 Vaccination 04/07/2019, 05/05/2019, 03/02/2020   Pneumococcal Conjugate-13 04/25/2015   Pneumococcal Polysaccharide-23 02/05/2018   Tdap 01/20/2020    Conditions to be addressed/monitored: HLD, DMII, and chronic diarrhea  Care Plan : Medication Management  Updates made by De Hollingshead, RPH-CPP since 09/07/2020 12:00 AM     Problem: Diabetes      Long-Range Goal: Disease Progression Prevention   This Visit's Progress: On track  Recent Progress: On track  Priority: High  Note:   Current Barriers:  Unable to achieve control of diabetes  Does not adhere to prescribed medication regimen  Pharmacist Clinical Goal(s):  Over the next 90 days, patient  will improve control of diabetes as evidenced by improvement in A1c. Over the next 90 days, patient will adhere to prescribed medication regimen.  Interventions: 1:1 collaboration with  Crecencio Mc, MD regarding development and update of comprehensive plan of care as evidenced by provider attestation and co-signature Inter-disciplinary care team collaboration (see longitudinal plan of care) Comprehensive medication review performed; medication list updated in electronic medical record  Health Maintenance: Reports she was out of town on Sunday and described an episode of her legs "giving out as if they weren't there" while trying to walk to her car. Had to be helped to the car. Reports she woke up sore all over on Monday. Is afraid to walk. Offered appointment with provider to evaluate. Patient declined.   Diabetes: Uncontrolled; current treatment: Lantus 22 units daily, Humalog 5 units TID PRN higher blood sugar prior to meals   Hx Victoza, but was discontinued after an episode of gallstone pancreatitis (recommended by GI providers while hospitalized), will avoid GLP1 moving forward Hx metformin XR, patient d/c after diarrhea Unsure if patient would tolerate SGLT2 d/t baseline urinary incontinence  Insurance should now cover CGM technology. Sending order request to [facility] via [employer].  Humalog prescribed as cartridges. Resent order for pens  Hypertension: Controlled; current treatment: amlodipine 10 mg daily, furosemide 20 mg daily Previously recommended to continue current treatment regimen. Previously discussed benefit of ambulatory monitoring.   Hyperlipidemia: Uncontrolled; current treatment: ezetimibe 10 mg daily;  Medications previously tried: Reports hx muscle aches and GI upset with atorvastatin, rosuvastatin 5 mg daily. Refuses retrial. At this time, not eligible for PCSK9i or Nexletol as she does not have hx ASCVD.  Given age and intolerances, previously  recommended to  current regimen at this time  IBS w/ gallbladder removal: Improved; current regimen: dicyclomine 20 mg QID PRN; taking appropriately 30 minutes before meals and bedtime; Creon 2 capsules w/ meals and 1 with bedtime just initiated GI referral and gastric emptying study ordered by PCP, but patient declined both when she was called to schedule because her diarrhea had improved. Continues to have sporadic eating habits to try to avoid diarrhea. Days where she is in public she will avoid eating.  Attempted to explain to patient that the GI referral does not necessarily entail a procedure. Tried to explain that having a GI provider on board for future concerns would be appropriate. Patient continues to decline because she has not had diarrhea in the past several days.  Sounds like pharmacy attached a separate label to the Creon bottle due to the length of the sig, but patient didn't realize this and was confused with instructions. Took 1 tablet before each meal on Monday and Tuesday. Started taking 2 today. Tolerating.  Continue current regimen at this time  GERD: Controlled per patient report, current treatment: pantoprazole 40 mg BID PRN GERD Previously recommended to continue current treatment regimen.  Could consider de-escalation to PRN famotidine or tums instead. Will discuss moving forward.   Arthritis Pain s/p bilateral hip and knee replacements Controlled, current treatment regimen: celecoxib 100 mg BID, acetaminophen PRN, tizanidine 4 mg TID PRN (sometimes 3-4 doses daily) Previously encouraged to continue current regimen with moderation with tizanidine use   OAB/Urinary Incontience: Moderately well controlled per patient report; taking tolterodine LA 4 mg daily Previously recommended to continue current regimen.  Over the Counter Medications: Current regimen: cetirizine 10 mg daily for allergies, Vitamin D 1000 units daily No interactions noted. Previously recommended to  continue current regimen   Patient Goals/Self-Care Activities Over the next 90 days, patient will:  - take medications as prescribed check blood glucose three times daily using CGM, document,  and provide at future appointments  Follow Up Plan: Telephone follow up appointment with care management team member scheduled for: ~1 week     Medication Assistance: None required.  Patient affirms current coverage meets needs.  Patient's preferred pharmacy is:  [id number] [place], [place]Phone: [phone]: [phone] [facility] [place]Phone: [phone]: [phone]  Follow Up:  Patient agrees to Care Plan and Follow-up.  Plan: Telephone follow up appointment with care management team member scheduled for:  ~ 1 week  Catie TDarnelle Maffucci PharmD, BCallisburg CMunsons CornersClinical Pharmacist LOccidental Petroleumat BJohnson & [phone]

## 2020-09-06 NOTE — Telephone Encounter (Signed)
Returned call.   Reports she was out of town on Sunday and described an episode of her legs "giving out as if they weren't there" while trying to walk to her car. Had to be helped to the car. Reports she woke up sore all over on Monday. Is afraid to walk.   Reports that she was eating normally the prior week and wasn't having diarrhea. Didn't eat anything on Saturday to avoid having diarrhea while out and about.   Tried to discuss the referral to gastroenterology. She states that someone who called (unclear if GI for office appointment or [place]) was trying to schedule her a procedure for her diarrhea, and since she hasn't had any lately, she didn't need the procedure. Tried to discuss that it would still be beneficial to establish with gastroenterology in case concerns moving forward, but I cannot get patient to understand that the GI referral may not automatically equate to a procedure for diarrhea.   Still requests to reschedule my appointment to a phone visit later this month.   Has been taking Creon starting this past Monday.

## 2020-09-06 NOTE — Patient Instructions (Signed)
Visit Information  PATIENT GOALS:  Goals Addressed               This Visit's Progress     Patient Stated     Medication Monitoring (pt-stated)        Patient Goals/Self-Care Activities Over the next 90 days, patient will:  - take medications as prescribed check blood glucose three times daily using CGM, document, and provide at future appointments         Patient verbalizes understanding of instructions provided today and agrees to view in MyChart.   Plan: Telephone follow up appointment with care management team member scheduled for:  ~ 1 week  [name], PharmD, [place], CPP Clinical Pharmacist [employer] at [employer] [phone]

## 2020-09-06 NOTE — Telephone Encounter (Signed)
Pt called and would like a call back from you but wants her appt canceled.

## 2020-09-06 NOTE — Telephone Encounter (Signed)
Returned patient's call. Unable to leave voicemail.   Cancelled appointment for tomorrow and will collaborate with scheduling team to outreach to reschedule at the patient's convenience.

## 2020-09-07 ENCOUNTER — Other Ambulatory Visit: Payer: Self-pay | Admitting: Internal Medicine

## 2020-09-07 ENCOUNTER — Ambulatory Visit: Payer: Medicare Other

## 2020-09-07 MED ORDER — INSULIN LISPRO (1 UNIT DIAL) 100 UNIT/ML (KWIKPEN): 5.0000 [IU] | PEN_INJECTOR | Freq: Three times a day (TID) | SUBCUTANEOUS | 3 refills | Status: DC

## 2020-09-07 NOTE — Addendum Note (Signed)
Addended by: Lourena Simmonds on: 09/07/2020 04:45 PM   Modules accepted: Orders

## 2020-09-11 ENCOUNTER — Ambulatory Visit: Payer: Medicare Other | Admitting: Podiatry

## 2020-09-13 ENCOUNTER — Telehealth: Payer: Self-pay | Admitting: Internal Medicine

## 2020-09-13 NOTE — Telephone Encounter (Signed)
PT would like to talk to Bonnie Hinton in regards to her insulin that was order today. She would like to be called back today.

## 2020-09-14 ENCOUNTER — Ambulatory Visit: Payer: Medicare Other | Admitting: Pharmacist

## 2020-09-14 DIAGNOSIS — E118 Type 2 diabetes mellitus with unspecified complications: Secondary | ICD-10-CM

## 2020-09-14 DIAGNOSIS — E782 Mixed hyperlipidemia: Secondary | ICD-10-CM

## 2020-09-14 DIAGNOSIS — Z8673 Personal history of transient ischemic attack (TIA), and cerebral infarction without residual deficits: Secondary | ICD-10-CM

## 2020-09-14 DIAGNOSIS — I10 Essential (primary) hypertension: Secondary | ICD-10-CM

## 2020-09-14 MED ORDER — INSULIN LISPRO (1 UNIT DIAL) 100 UNIT/ML (KWIKPEN): 5.0000 [IU] | PEN_INJECTOR | Freq: Three times a day (TID) | SUBCUTANEOUS | 3 refills | Status: DC

## 2020-09-14 NOTE — Patient Instructions (Signed)
Visit Information  PATIENT GOALS:  Goals Addressed               This Visit's Progress     Patient Stated     Medication Monitoring (pt-stated)        Patient Goals/Self-Care Activities Over the next 90 days, patient will:  - take medications as prescribed check blood glucose three times daily using CGM, document, and provide at future appointments          Patient verbalizes understanding of instructions provided today and agrees to view in MyChart.   Plan: Telephone follow up appointment with care management team member scheduled for:  ~ 4 weeks  [name], PharmD, [place], CPP Clinical Pharmacist [employer] at [employer] [phone]

## 2020-09-14 NOTE — Telephone Encounter (Signed)
Per Catie pt had a visit with her today and they discussed the insulin.

## 2020-09-14 NOTE — Chronic Care Management (AMB) (Signed)
Chronic Care Management Pharmacy Note  09/14/2020 Name:  Bonnie Hinton MRN:  [id number] DOB:  19-Mar-1941  Subjective: Bonnie Hinton is an 80 y.o. year old female who is a primary patient of Derrel Nip, Aris Everts, MD.  The CCM team was consulted for assistance with disease management and care coordination needs.    Engaged with patient by telephone for follow up visit in response to provider referral for pharmacy case management and/or care coordination services.   Consent to Services:  The patient was given information about Chronic Care Management services, agreed to services, and gave verbal consent prior to initiation of services.  Please see initial visit note for detailed documentation.   Patient Care Team: Crecencio Mc, MD as PCP - General (Internal Medicine) De Hollingshead, RPH-CPP (Pharmacist)  Objective:  Lab Results  Component Value Date   CREATININE 0.84 08/16/2020   CREATININE 0.77 04/05/2020   CREATININE 1.01 12/31/2019    Lab Results  Component Value Date   HGBA1C 8.4 (H) 08/16/2020   Last diabetic Eye exam:  Lab Results  Component Value Date/Time   HMDIABEYEEXA No Retinopathy 05/19/2020 12:00 AM    Last diabetic Foot exam: No results found for: HMDIABFOOTEX      Component Value Date/Time   CHOL 231 (H) 08/16/2020 1150   TRIG 234.0 (H) 08/16/2020 1150   HDL 46.90 08/16/2020 1150   CHOLHDL 5 08/16/2020 1150   VLDL 46.8 (H) 08/16/2020 1150   LDLCALC 210 (H) 12/25/2016 1005   LDLDIRECT 162.0 08/16/2020 1150    Hepatic Function Latest Ref Rng & Units 08/16/2020 04/05/2020 12/31/2019  Total Protein 6.0 - 8.3 g/dL 7.4 7.3 8.0  Albumin 3.5 - 5.2 g/dL 4.2 4.2 4.5  AST 0 - 37 U/L 24 18 32  ALT 0 - 35 U/L _0 Alk Phosphatase 39 - 117 U/L 76 60 77  Total Bilirubin 0.2 - 1.2 mg/dL 0.4 0.4 0.6  Bilirubin, Direct 0.0 - 0.3 mg/dL - - -    Lab Results  Component Value Date/Time   TSH 2.69 08/16/2020 11:50 AM   TSH 13.46 (H) 12/31/2019  09:13 AM   FREET4 0.21 (L) 03/26/2017 10:21 AM    CBC Latest Ref Rng & Units 06/10/2018 09/24/2016 09/13/2016  WBC 4.0 - 10.5 K/uL 8.4 8.9 11.1(H)  Hemoglobin 12.0 - 15.0 g/dL 14.9 13.1 10.6(L)  Hematocrit 36.0 - 46.0 % 45.1 41 33.6(L)  Platelets 150.0 - 400.0 K/uL 306.0 452(A) 315    Lab Results  Component Value Date/Time   VD25OH 40.57 12/31/2019 09:13 AM   VD25OH 25.21 (L) 12/25/2016 10:05 AM    Clinical ASCVD: No  The 10-year ASCVD risk score Mikey Bussing DC Jr., et al., 2013) is: 56.5%   Values used to calculate the score:     Age: 34 years     Sex: Female     Is Non-Hispanic African American: No     Diabetic: Yes     Tobacco smoker: No     Systolic Blood Pressure: 174 mmHg     Is BP treated: Yes     HDL Cholesterol: 46.9 mg/dL     Total Cholesterol: 231 mg/dL      Social History   Tobacco Use  Smoking Status Never  Smokeless Tobacco Never   BP Readings from Last 3 Encounters:  08/16/20 (!) 140/96  04/05/20 124/66  12/31/19 (!) 150/78   Pulse Readings from Last 3 Encounters:  08/16/20 (!) 115  04/05/20 75  12/31/19 97   Wt Readings from Last 3 Encounters:  08/16/20 (!) 323 lb 4 oz (146.6 kg)  04/05/20 (!) 309 lb (140.2 kg)  12/31/19 (!) 323 lb (146.5 kg)    Assessment: Review of patient past medical history, allergies, medications, health status, including review of consultants reports, laboratory and other test data, was performed as part of comprehensive evaluation and provision of chronic care management services.   SDOH:  (Social Determinants of Health) assessments and interventions performed: none today   CCM Care Plan  Allergies  Allergen Reactions   Erythromycin Rash   Fentanyl Other (See Comments)    Duragesic-25 Duragesic-25   Iodinated Diagnostic Agents Swelling and Rash   Atorvastatin     "stomach problems"   Metformin And Related Other (See Comments)    Diarrhea even w/ XR metformin   Victoza [Liraglutide]     Suspected to be the cause of  gallstone pancreatitis June 2018   Latex Itching and Rash   Lisinopril Other (See Comments)    Hyperkalemia    Other Rash    Paper tape, possibly adhesive tape PAPER TAPE     Medications Reviewed Today     Reviewed by Ezequiel Ganser, CMA (Certified Medical Assistant) on 08/16/20 at [facility] List Status: <None>   Medication Order Taking? Sig Documenting Provider Last Dose Status Informant  acetaminophen (TYLENOL) 325 MG tablet [id number] Yes Take 2 tablets (650 mg total) by mouth every 6 (six) hours as needed for mild pain (or Fever >/= 101). Shepperson, Kirstin, PA-C Taking Active Self  amLODipine (NORVASC) 10 MG tablet [id number] Yes TAKE 1 TABLET DAILY Crecencio Mc, MD Taking Active   amoxicillin (AMOXIL) 500 MG capsule [id number] Yes TAKE 4 CAPSULES BY MOUTH 1 HOUR PRIOR TO DENTAL PROCEDURE Crecencio Mc, MD Taking Active   B-D UF III MINI PEN NEEDLES 31G X 5 MM MISC [id number] Yes SMARTSIG:1 SUB-Q Every Night [provider] Taking Active   celecoxib (CELEBREX) 100 MG capsule [id number] Yes TAKE 1 CAPSULE TWICE A DAY Crecencio Mc, MD Taking Active   cetirizine (ZYRTEC) 10 MG tablet [id number] Yes Take 10 mg by mouth daily. [provider] Taking Active   Cholecalciferol (VITAMIN D3) 2000 units TABS [id number] Yes Take 1 tablet by mouth daily. [provider] Taking Active   cycloSPORINE (RESTASIS) 0.05 % ophthalmic emulsion [id number] Yes Place 1 drop into both eyes 2 (two) times daily. [provider] Taking Active Self  dicyclomine (BENTYL) 20 MG tablet [id number] Yes TAKE 1 TABLET FOUR TIMES A DAY BEFORE MEALS AND AT BEDTIME Crecencio Mc, MD Taking Active            Med Note Kelby Aline Mar 01, 2020 11:09 AM)    ezetimibe (ZETIA) 10 MG tablet [id number] Yes TAKE 1 TABLET DAILY Crecencio Mc, MD Taking Active   furosemide (LASIX) 20 MG tablet [id number] Yes TAKE 1 TABLET BY MOUTH DAILY AS NEEDED. FOR FLUID RETENTION NOT MORE  THAN EVERY OTHER DAY Crecencio Mc, MD Taking Active   glucose blood [facility] VERIO) test strip [id number] Yes USE TO TEST BLOOD SUGAR ONCE DAILY Crecencio Mc, MD Taking Active   insulin glargine (LANTUS SOLOSTAR) 100 UNIT/ML Solostar Pen [id number] Yes Inject 20 units daily. Titrate as instructed. Max daily dose 30 units. Crecencio Mc, MD Taking Active   insulin lispro (HUMALOG) 100 UNIT/ML cartridge [id number] Yes Inject 0.05 mLs (5 Units total) into the skin  3 (three) times daily with meals. Crecencio Mc, MD Taking Active   Lancets 30G MISC [id number] Yes Use to check blood sugars once daily. Crecencio Mc, MD Taking Active   levothyroxine (SYNTHROID) 137 MCG tablet [id number] Yes TAKE 1 TABLET DAILY BEFORE BREAKFAST Crecencio Mc, MD Taking Active   LUMIGAN 0.01 % SOLN [id number] Yes Place 1-2 drops into both eyes at bedtime. Crecencio Mc, MD Taking Active   nystatin cream Royden Purl) [id number] Yes APPLY TOPICALLY TWICE A DAY Crecencio Mc, MD Taking Active   ofloxacin (OCUFLOX) 0.3 % ophthalmic solution [id number] Yes Place 1 drop into the left eye 4 (four) times daily. [provider] Taking Active   pantoprazole (PROTONIX) 40 MG tablet [id number] Yes TAKE 1 TABLET DAILY Crecencio Mc, MD Taking Active   prednisoLONE acetate (PRED FORTE) 1 % ophthalmic suspension [id number] Yes PLEASE SEE ATTACHED FOR DETAILED DIRECTIONS [provider] Taking Active   tiZANidine (ZANAFLEX) 4 MG capsule [id number] Yes TAKE 1 CAPSULE THREE TIMES A DAY AS NEEDED FOR MUSCLE SPASMS Crecencio Mc, MD Taking Active   tolterodine (DETROL LA) 4 MG 24 hr capsule [id number] Yes TAKE 1 CAPSULE TWICE A DAY Crecencio Mc, MD Taking Active             Patient Active Problem List   Diagnosis Date Noted   Mild protein-calorie malnutrition ([place]) 08/16/2020   OA (osteoarthritis) of finger, right 04/05/2020   Statin intolerance 03/05/2018   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    Candidiasis of skin 06/28/2017   IBS (irritable bowel syndrome) 06/28/2017   Screening for colon cancer 12/28/2016   Idiopathic acute pancreatitis without infection or necrosis    Diarrhea following gastrointestinal surgery 09/04/2016   Dizziness 05/24/2016   Anxiety, generalized 02/20/2016   Arthritis    Acquired hypothyroidism    History of CVA (cerebrovascular accident)    Status post bilateral hip replacements    Status post right knee replacement    Degenerative lumbar spinal stenosis    Essential hypertension, benign 07/25/2015   Preoperative clearance 07/25/2015   Hyperlipidemia 07/25/2015   Mixed stress and urge urinary incontinence 04/26/2015   Diabetic nephropathy associated with type 2 diabetes mellitus ([place]) 01/24/2015   Morbid obesity ([place]) 01/24/2015   Vitamin D deficiency 01/24/2015   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, right 09/23/2014    Immunization History  Administered Date(s) Administered   Fluad Quad(high Dose 65+) 01/01/2019, 12/31/2019   Influenza, High Dose Seasonal PF 01/23/2015, 12/04/2015, 12/25/2016, 12/23/2017   Moderna Sars-Covid-2 Vaccination 04/07/2019, 05/05/2019, 03/02/2020   Pneumococcal Conjugate-13 04/25/2015   Pneumococcal Polysaccharide-23 02/05/2018   Tdap 01/20/2020    Conditions to be addressed/monitored: HTN, HLD, and DMII  Care Plan : Medication Management  Updates made by De Hollingshead, RPH-CPP since 09/14/2020 12:00 AM     Problem: Diabetes      Long-Range Goal: Disease Progression Prevention   This Visit's Progress: On track  Recent Progress: On track  Priority: High  Note:   Current Barriers:  Unable to achieve control of diabetes  Does not adhere to prescribed medication regimen  Pharmacist Clinical Goal(s):  Over the next 90 days, patient will improve control of diabetes as evidenced by improvement in A1c. Over the next 90 days, patient will adhere to prescribed medication  regimen.  Interventions: 1:1 collaboration with Crecencio Mc, MD regarding development and update of comprehensive plan of care as evidenced by provider attestation and co-signature Inter-disciplinary care team collaboration (  see longitudinal plan of care) Comprehensive medication review performed; medication list updated in electronic medical record   Diabetes: Uncontrolled; current treatment: Lantus 22 units daily, Humalog 5 units TID PRN higher blood sugar prior to meals   Upset today because she picked up the Humalog cartridges and then tried to return, [facility]. She is upset at being expected to pay another $45 for [facility].  Reports that per [facility], copay for Libre 2 CGM is $140 per 90 days. She is upset about this.  Hx Victoza, but was discontinued after an episode of gallstone pancreatitis (recommended by GI providers while hospitalized), will avoid GLP1 moving forward Hx metformin XR, patient d/c after diarrhea Unsure if patient would tolerate SGLT2 d/t baseline urinary incontinence  Provided empathetic listening. Sent Humalog order to [facility] mail order pharmacy. Asked her to call me when she receives this medication so that we can go through dosing together.  Patient plans to call her insurance to see if [facility] was right about the copay. Advised her that if she cannot afford the copay for CGM, we do not expect her to pay it. We can continue to use fingersticks. She will talk to her insurance and communicate with [facility] if she decides to fill.   Hypertension: Controlled; current treatment: amlodipine 10 mg daily, furosemide 20 mg daily Previously recommended to continue current treatment regimen. Previously discussed benefit of ambulatory monitoring.   Hyperlipidemia: Uncontrolled; current treatment: ezetimibe 10 mg daily;  Medications previously tried: Reports hx muscle aches and GI upset with atorvastatin, rosuvastatin 5 mg daily. Refuses  retrial. At this time, not eligible for PCSK9i or Nexletol as she does not have hx ASCVD.  Given age and intolerances, previously recommended to  current regimen at this time  IBS w/ gallbladder removal: Improved; current regimen: dicyclomine 20 mg QID PRN; taking appropriately 30 minutes before meals and bedtime; Creon 2 capsules w/ meals and 1 with bedtime just initiated. Reports today being upset that nobody has told her how long before meals to take. Reviewed to take Creon within 10 minutes before meal. Reviewed pathologic mechanism of Creon and how we antiicpate it to work or chronic diarrhea at her request.  Continue current regimen at this time  GERD: Controlled per patient report, current treatment: pantoprazole 40 mg BID PRN GERD Previously recommended to continue current treatment regimen.  Could consider de-escalation to PRN famotidine or tums instead. Will discuss moving forward.   Arthritis Pain s/p bilateral hip and knee replacements Controlled, current treatment regimen: celecoxib 100 mg BID, acetaminophen PRN, tizanidine 4 mg TID PRN (sometimes 3-4 doses daily) Previously encouraged to continue current regimen with moderation with tizanidine use   OAB/Urinary Incontience: Moderately well controlled per patient report; taking tolterodine LA 4 mg daily Previously recommended to continue current regimen.  Over the Counter Medications: Current regimen: cetirizine 10 mg daily for allergies, Vitamin D 1000 units daily No interactions noted. Previously recommended to continue current regimen   Patient Goals/Self-Care Activities Over the next 90 days, patient will:  - take medications as prescribed check blood glucose three times daily using CGM, document, and provide at future appointments  Follow Up Plan: Telephone follow up appointment with care management team member scheduled for: ~4 weeks     Medication Assistance: None required.  Patient affirms current coverage  meets needs.  [facility] pharmacy [facility]  [id number] [place] [place] [place]Phone: [phone]: [phone]  [place], [place] [address] Phone: [phone] Fax: [phone]   Follow Up:  Patient agrees to Care Plan and Follow-up.  Plan: Telephone follow up appointment with care management team member scheduled for:  ~ 4 weeks  [name], PharmD, [place], [place] Clinical Pharmacist [employer] [facility] [employer] [phone]

## 2020-09-14 NOTE — Telephone Encounter (Signed)
LMTCB

## 2020-09-18 ENCOUNTER — Ambulatory Visit: Payer: Medicare Other | Admitting: Podiatry

## 2020-09-19 ENCOUNTER — Telehealth: Payer: Self-pay | Admitting: Internal Medicine

## 2020-09-19 NOTE — Telephone Encounter (Signed)
I spoke with pt she took Creon on two weeks ago pt states it was prescribed. On Thursday she started to get diarrhea when she took it and stop taking it and it stopped. Pt wants to know should she stop taking it? Please advise and Thank you!  Call pt @ [phone].

## 2020-09-20 NOTE — Telephone Encounter (Signed)
Spoke with pt and she stated that when the GI office called her she was not having the diarrhea any more so they told her that they would hold off on the appt and to call them if it came back. Pt stated that she had been doing good and not having any diarrhea but when she started taking the Creon 3 days later she developed diarrhea again. She stated that one of the side effects of the Creon is diarrhea so she quit taking it and has not had any more diarrhea.

## 2020-09-29 ENCOUNTER — Telehealth: Payer: Medicare Other

## 2020-10-13 ENCOUNTER — Other Ambulatory Visit: Payer: Self-pay | Admitting: Internal Medicine

## 2020-10-13 DIAGNOSIS — E118 Type 2 diabetes mellitus with unspecified complications: Secondary | ICD-10-CM

## 2020-10-18 ENCOUNTER — Other Ambulatory Visit: Payer: Self-pay

## 2020-10-18 MED ORDER — TOLTERODINE TARTRATE ER 4 MG PO CP24
4.0000 mg | ORAL_CAPSULE | Freq: Two times a day (BID) | ORAL | 3 refills | Status: DC
Start: 2020-10-18 — End: 2021-03-20

## 2020-10-23 ENCOUNTER — Ambulatory Visit: Payer: Medicare Other | Admitting: Podiatry

## 2020-10-23 ENCOUNTER — Other Ambulatory Visit: Payer: Self-pay

## 2020-10-23 ENCOUNTER — Encounter: Payer: Self-pay | Admitting: Podiatry

## 2020-10-23 DIAGNOSIS — M79675 Pain in left toe(s): Secondary | ICD-10-CM | POA: Diagnosis not present

## 2020-10-23 DIAGNOSIS — B351 Tinea unguium: Secondary | ICD-10-CM

## 2020-10-23 DIAGNOSIS — M79676 Pain in unspecified toe(s): Secondary | ICD-10-CM

## 2020-10-23 DIAGNOSIS — M79674 Pain in right toe(s): Secondary | ICD-10-CM

## 2020-10-23 DIAGNOSIS — E1142 Type 2 diabetes mellitus with diabetic polyneuropathy: Secondary | ICD-10-CM

## 2020-10-23 NOTE — Progress Notes (Signed)
This patient returns to my office for at risk foot care.  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  This patient is unable to cut nails herself since the patient cannot reach her  nails.These nails are painful walking and wearing shoes.  This patient presents for at risk foot care today.  General Appearance  Alert, conversant and in no acute stress.  Vascular  Dorsalis pedis and posterior tibial  pulses are palpable  bilaterally.  Capillary return is within normal limits  bilaterally. Temperature is within normal limits  bilaterally.  Neurologic  Senn-Weinstein monofilament wire test within normal limits  bilaterally. Muscle power within normal limits bilaterally.  Nails Thick disfigured discolored nails with subungual debris  from hallux to fifth toes bilaterally. No evidence of bacterial infection or drainage bilaterally.  Orthopedic  No limitations of motion  feet .  No crepitus or effusions noted.  Contracted digits  B/L.  Severe PTTD left foot.   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  Hammer toes 2,3 right foot.  Skin  normotropic skin with no porokeratosis noted bilaterally.  No signs of infections or ulcers noted.     Onychomycosis  Pain in right toes  Pain in left toes  Consent was obtained for treatment procedures.   Mechanical debridement of nails 1-5  bilaterally performed with a nail nipper.  Filed with dremel without incident. No infection or ulcer.     Return office visit 3 months         Told patient to return for periodic foot care and evaluation due to potential at risk complications.   Helane Gunther DPM

## 2020-11-13 ENCOUNTER — Other Ambulatory Visit: Payer: Self-pay | Admitting: Internal Medicine

## 2020-12-04 ENCOUNTER — Other Ambulatory Visit: Payer: Self-pay | Admitting: Internal Medicine

## 2020-12-04 DIAGNOSIS — IMO0001 Reserved for inherently not codable concepts without codable children: Secondary | ICD-10-CM

## 2020-12-04 DIAGNOSIS — E1122 Type 2 diabetes mellitus with diabetic chronic kidney disease: Secondary | ICD-10-CM

## 2020-12-27 ENCOUNTER — Other Ambulatory Visit: Payer: Self-pay | Admitting: Internal Medicine

## 2020-12-28 ENCOUNTER — Ambulatory Visit: Payer: Medicare Other

## 2020-12-28 ENCOUNTER — Telehealth: Payer: Self-pay | Admitting: Internal Medicine

## 2020-12-28 NOTE — Telephone Encounter (Signed)
Calling in and states that she was told last year that Shanda Bumps will come to her car and giver her the flu shot this year.   Patient would like a call back for when this can be done. States can leave a message with a time and date for her to come if she does not answer the phone.

## 2020-12-28 NOTE — Telephone Encounter (Signed)
Spoke with pt to let her know that we are not allowed to give flu shots in the parking lot. Pt stated that she would call back later to schedule an appt.

## 2021-01-17 ENCOUNTER — Other Ambulatory Visit: Payer: Self-pay

## 2021-01-17 ENCOUNTER — Ambulatory Visit (INDEPENDENT_AMBULATORY_CARE_PROVIDER_SITE_OTHER): Payer: Medicare Other

## 2021-01-17 DIAGNOSIS — Z23 Encounter for immunization: Secondary | ICD-10-CM

## 2021-01-19 ENCOUNTER — Telehealth: Payer: Self-pay | Admitting: Internal Medicine

## 2021-01-19 NOTE — Telephone Encounter (Signed)
Copied from CRM [id number]. Topic: Medicare AWV >> Jan 19, 2021 11:18 AM [place], [place] Gully wrote: Reason for CRM: Call both numbers provided; No voice mails.  Called 01/19/21 @11 :18am to r/s 01/26/21 AWV appt [facility]

## 2021-01-26 ENCOUNTER — Ambulatory Visit: Payer: Medicare Other

## 2021-02-22 ENCOUNTER — Encounter: Payer: Self-pay | Admitting: Podiatry

## 2021-02-22 ENCOUNTER — Ambulatory Visit: Payer: Medicare Other | Admitting: Podiatry

## 2021-02-22 ENCOUNTER — Other Ambulatory Visit: Payer: Self-pay

## 2021-02-22 DIAGNOSIS — E1121 Type 2 diabetes mellitus with diabetic nephropathy: Secondary | ICD-10-CM | POA: Diagnosis not present

## 2021-02-22 DIAGNOSIS — M79676 Pain in unspecified toe(s): Secondary | ICD-10-CM | POA: Diagnosis not present

## 2021-02-22 DIAGNOSIS — B351 Tinea unguium: Secondary | ICD-10-CM | POA: Diagnosis not present

## 2021-02-22 DIAGNOSIS — E1142 Type 2 diabetes mellitus with diabetic polyneuropathy: Secondary | ICD-10-CM

## 2021-02-22 NOTE — Progress Notes (Signed)
This patient returns to my office for at risk foot care.  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  This patient is unable to cut nails herself since the patient cannot reach her  nails.These nails are painful walking and wearing shoes.  This patient presents for at risk foot care today.  General Appearance  Alert, conversant and in no acute stress.  Vascular  Dorsalis pedis and posterior tibial  pulses are palpable  bilaterally.  Capillary return is within normal limits  bilaterally. Temperature is within normal limits  bilaterally.  Neurologic  Senn-Weinstein monofilament wire test within normal limits  bilaterally. Muscle power within normal limits bilaterally.  Nails Thick disfigured discolored nails with subungual debris  from hallux to fifth toes bilaterally. No evidence of bacterial infection or drainage bilaterally.  Orthopedic  No limitations of motion  feet .  No crepitus or effusions noted.  Contracted digits  B/L.  Severe PTTD left foot.   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  Hammer toes 2,3 right foot.  Skin  normotropic skin with no porokeratosis noted bilaterally.  No signs of infections or ulcers noted.     Onychomycosis  Pain in right toes  Pain in left toes  Consent was obtained for treatment procedures.   Mechanical debridement of nails 1-5  bilaterally performed with a nail nipper.  Filed with dremel without incident. No infection or ulcer.     Return office visit 3 months         Told patient to return for periodic foot care and evaluation due to potential at risk complications.   Helane Gunther DPM

## 2021-03-14 ENCOUNTER — Ambulatory Visit (INDEPENDENT_AMBULATORY_CARE_PROVIDER_SITE_OTHER): Payer: Medicare Other

## 2021-03-14 ENCOUNTER — Telehealth: Payer: Self-pay

## 2021-03-14 VITALS — Ht 65.0 in | Wt 323.0 lb

## 2021-03-14 DIAGNOSIS — Z0000 Encounter for general adult medical examination without abnormal findings: Secondary | ICD-10-CM | POA: Diagnosis not present

## 2021-03-14 DIAGNOSIS — N3941 Urge incontinence: Secondary | ICD-10-CM

## 2021-03-14 DIAGNOSIS — E118 Type 2 diabetes mellitus with unspecified complications: Secondary | ICD-10-CM

## 2021-03-14 NOTE — Chronic Care Management (AMB) (Signed)
°  Care Management   Note  03/14/2021 Name: Bonnie Hinton MRN: [id number] DOB: 1940/08/06  Bonnie Hinton is a 80 y.o. year old female who is a primary care patient of Darrick Huntsman, Mar Daring, MD and is actively engaged with the care management team. I reached out to [name] by phone today to assist with re-scheduling a follow up visit with the RN Case Manager  Follow up plan: Unsuccessful telephone outreach attempt made. A HIPAA compliant phone message was left for the patient providing contact information and requesting a return call.  The care management team will reach out to the patient again over the next 7 days.  If patient returns call to provider office, please advise to call Embedded Care Management Care Guide Penne Lash  at [phone]  Penne Lash, RMA Care Guide, Embedded Care Coordination [facility]  [place], [place] [address] Direct Dial: [phone] [email or web address]: [email or web address]

## 2021-03-14 NOTE — Patient Instructions (Addendum)
Bonnie Hinton , Thank you for taking time to come for your Medicare Wellness Visit. I appreciate your ongoing commitment to your health goals. Please review the following plan we discussed and let me know if I can assist you in the future.   These are the goals we discussed:  Goals       Patient Stated     I plan to start the Scraper (pt-stated)      Portion control      Medication Monitoring (pt-stated)      Patient Goals/Self-Care Activities Over the next 90 days, patient will:  - take medications as prescribed check blood glucose three times daily using CGM, document, and provide at future appointments         This is a list of the screening recommended for you and due dates:  Health Maintenance  Topic Date Due   Urine Protein Check  12/30/2020   Hemoglobin A1C  02/16/2021   COVID-19 Vaccine (4 - Booster for Moderna series) 03/30/2021*   Zoster (Shingles) Vaccine (1 of 2) 06/12/2021*   DEXA scan (bone density measurement)  03/14/2022*   Eye exam for diabetics  05/19/2021   Complete foot exam   08/16/2021   Tetanus Vaccine  01/19/2030   Pneumonia Vaccine  Completed   Flu Shot  Completed   HPV Vaccine  Aged Out  *Topic was postponed. The date shown is not the original due date.    Advanced directives: End of life planning; Advance aging; Advanced directives discussed.  Copy of current HCPOA/Living Will requested.    Conditions/risks identified: none new  Follow up in one year for your annual wellness visit.   Preventive Care 40-64 Years, Female Preventive care refers to lifestyle choices and visits with your health care provider that can promote health and wellness. What does preventive care include? A yearly physical exam. This is also called an annual well check. Dental exams once or twice a year. Routine eye exams. Ask your health care provider how often you should have your eyes checked. Personal lifestyle choices, including: Daily care of your teeth and  gums. Regular physical activity. Eating a healthy diet. Avoiding tobacco and drug use. Limiting alcohol use. Practicing safe sex. Taking low-dose aspirin daily starting at age 56. Taking vitamin and mineral supplements as recommended by your health care provider. What happens during an annual well check? The services and screenings done by your health care provider during your annual well check will depend on your age, overall health, lifestyle risk factors, and family history of disease. Counseling  Your health care provider may ask you questions about your: Alcohol use. Tobacco use. Drug use. Emotional well-being. Home and relationship well-being. Sexual activity. Eating habits. Work and work Statistician. Method of birth control. Menstrual cycle. Pregnancy history. Screening  You may have the following tests or measurements: Height, weight, and BMI. Blood pressure. Lipid and cholesterol levels. These may be checked every 5 years, or more frequently if you are over 54 years old. Skin check. Lung cancer screening. You may have this screening every year starting at age 30 if you have a 30-pack-year history of smoking and currently smoke or have quit within the past 15 years. Fecal occult blood test (FOBT) of the stool. You may have this test every year starting at age 34. Flexible sigmoidoscopy or colonoscopy. You may have a sigmoidoscopy every 5 years or a colonoscopy every 10 years starting at age 37. Hepatitis C blood test. Hepatitis B blood test.  Sexually transmitted disease (STD) testing. Diabetes screening. This is done by checking your blood sugar (glucose) after you have not eaten for a while (fasting). You may have this done every 1-3 years. Mammogram. This may be done every 1-2 years. Talk to your health care provider about when you should start having regular mammograms. This may depend on whether you have a family history of breast cancer. BRCA-related cancer  screening. This may be done if you have a family history of breast, ovarian, tubal, or peritoneal cancers. Pelvic exam and Pap test. This may be done every 3 years starting at age 7. Starting at age 76, this may be done every 5 years if you have a Pap test in combination with an HPV test. Bone density scan. This is done to screen for osteoporosis. You may have this scan if you are at high risk for osteoporosis. Discuss your test results, treatment options, and if necessary, the need for more tests with your health care provider. Vaccines  Your health care provider may recommend certain vaccines, such as: Influenza vaccine. This is recommended every year. Tetanus, diphtheria, and acellular pertussis (Tdap, Td) vaccine. You may need a Td booster every 10 years. Zoster vaccine. You may need this after age 65. Pneumococcal 13-valent conjugate (PCV13) vaccine. You may need this if you have certain conditions and were not previously vaccinated. Pneumococcal polysaccharide (PPSV23) vaccine. You may need one or two doses if you smoke cigarettes or if you have certain conditions. Talk to your health care provider about which screenings and vaccines you need and how often you need them. This information is not intended to replace advice given to you by your health care provider. Make sure you discuss any questions you have with your health care provider. Document Released: 04/07/2015 Document Revised: 11/29/2015 Document Reviewed: 01/10/2015 Elsevier Interactive Patient Education  2017 [place] Prevention in the Home Falls can cause injuries. They can happen to people of all ages. There are many things you can do to make your home safe and to help prevent falls. What can I do on the outside of my home? Regularly fix the edges of walkways and driveways and fix any cracks. Remove anything that might make you trip as you walk through a door, such as a raised step or threshold. Trim any  bushes or trees on the path to your home. Use bright outdoor lighting. Clear any walking paths of anything that might make someone trip, such as rocks or tools. Regularly check to see if handrails are loose or broken. Make sure that both sides of any steps have handrails. Any raised decks and porches should have guardrails on the edges. Have any leaves, snow, or ice cleared regularly. Use sand or salt on walking paths during winter. Clean up any spills in your garage right away. This includes oil or grease spills. What can I do in the bathroom? Use night lights. Install grab bars by the toilet and in the tub and shower. Do not use towel bars as grab bars. Use non-skid mats or decals in the tub or shower. If you need to sit down in the shower, use a plastic, non-slip stool. Keep the floor dry. Clean up any water that spills on the floor as soon as it happens. Remove soap buildup in the tub or shower regularly. Attach bath mats securely with double-sided non-slip rug tape. Do not have throw rugs and other things on the floor that can make you trip.  What can I do in the bedroom? Use night lights. Make sure that you have a light by your bed that is easy to reach. Do not use any sheets or blankets that are too big for your bed. They should not hang down onto the floor. Have a firm chair that has side arms. You can use this for support while you get dressed. Do not have throw rugs and other things on the floor that can make you trip. What can I do in the kitchen? Clean up any spills right away. Avoid walking on wet floors. Keep items that you use a lot in easy-to-reach places. If you need to reach something above you, use a strong step stool that has a grab bar. Keep electrical cords out of the way. Do not use floor polish or wax that makes floors slippery. If you must use wax, use non-skid floor wax. Do not have throw rugs and other things on the floor that can make you trip. What can I do  with my stairs? Do not leave any items on the stairs. Make sure that there are handrails on both sides of the stairs and use them. Fix handrails that are broken or loose. Make sure that handrails are as long as the stairways. Check any carpeting to make sure that it is firmly attached to the stairs. Fix any carpet that is loose or worn. Avoid having throw rugs at the top or bottom of the stairs. If you do have throw rugs, attach them to the floor with carpet tape. Make sure that you have a light switch at the top of the stairs and the bottom of the stairs. If you do not have them, ask someone to add them for you. What else can I do to help prevent falls? Wear shoes that: Do not have high heels. Have rubber bottoms. Are comfortable and fit you well. Are closed at the toe. Do not wear sandals. If you use a stepladder: Make sure that it is fully opened. Do not climb a closed stepladder. Make sure that both sides of the stepladder are locked into place. Ask someone to hold it for you, if possible. Clearly mark and make sure that you can see: Any grab bars or handrails. First and last steps. Where the edge of each step is. Use tools that help you move around (mobility aids) if they are needed. These include: Canes. Walkers. Scooters. Crutches. Turn on the lights when you go into a dark area. Replace any light bulbs as soon as they burn out. Set up your furniture so you have a clear path. Avoid moving your furniture around. If any of your floors are uneven, fix them. If there are any pets around you, be aware of where they are. Review your medicines with your doctor. Some medicines can make you feel dizzy. This can increase your chance of falling. Ask your doctor what other things that you can do to help prevent falls. This information is not intended to replace advice given to you by your health care provider. Make sure you discuss any questions you have with your health care  provider. Document Released: 01/05/2009 Document Revised: 08/17/2015 Document Reviewed: 04/15/2014 Elsevier Interactive Patient Education  2017 [employer].

## 2021-03-14 NOTE — Progress Notes (Addendum)
Subjective:   Bonnie Hinton is a 80 y.o. female who presents for Medicare Annual (Subsequent) preventive examination.  Review of Systems    No ROS.  Medicare Wellness Virtual Visit.  Visual/audio telehealth visit, UTA vital signs.   See social history for additional risk factors.   Cardiac Risk Factors include: advanced age (>63men, >10 women)     Objective:    Today's Vitals   03/14/21 1532  Weight: (!) 323 lb (146.5 kg)  Height: 5\' 5"  (1.651 m)   Body mass index is 53.75 kg/m.  Advanced Directives 03/14/2021 12/28/2019 12/25/2018 12/23/2017 09/04/2016 04/05/2016 01/05/2016  Does Patient Have a Medical Advance Directive? Yes Yes Yes Yes Yes No;Yes No  Type of 01/07/2016 of [place];Living will Healthcare Power of [place];Living will Healthcare Power of [place];Living will Living will;Healthcare Power of [place] Power of [place];Living will - -  Does patient want to make changes to medical advance directive? No - Patient declined No - Patient declined No - Patient declined No - Patient declined No - Patient declined - -  Copy of Healthcare Power of Attorney in Chart? No - copy requested No - copy requested No - copy requested No - copy requested No - copy requested - -  Would patient like information on creating a medical advance directive? - - - - - - No - patient declined information    Current Medications (verified) Outpatient Encounter Medications as of 03/14/2021  Medication Sig   acetaminophen (TYLENOL) 325 MG tablet Take 2 tablets (650 mg total) by mouth every 6 (six) hours as needed for mild pain (or Fever >/= 101).   amLODipine (NORVASC) 10 MG tablet TAKE 1 TABLET DAILY   B-D UF III MINI PEN NEEDLES 31G X 5 MM MISC USE 1 NEEDLE DAILY   celecoxib (CELEBREX) 100 MG capsule TAKE 1 CAPSULE TWICE A DAY   cetirizine (ZYRTEC) 10 MG tablet Take 10 mg by mouth daily.   Cholecalciferol (VITAMIN D3) 2000 units TABS Take 1 tablet by mouth  daily.   cycloSPORINE (RESTASIS) 0.05 % ophthalmic emulsion Place 1 drop into both eyes 2 (two) times daily.   dicyclomine (BENTYL) 20 MG tablet TAKE 1 TABLET FOUR TIMES A DAY BEFORE MEALS AND AT BEDTIME   ezetimibe (ZETIA) 10 MG tablet TAKE 1 TABLET DAILY   furosemide (LASIX) 20 MG tablet TAKE 1 TABLET BY MOUTH DAILY AS NEEDED. FOR FLUID RETENTION NOT MORE THAN EVERY OTHER DAY   insulin glargine (LANTUS SOLOSTAR) 100 UNIT/ML Solostar Pen AT THE START OF THERAPY INJECT 10 UNITS UNDER THE SKIN DAILY, TITRATE AS INSTRUCTED THEREAFTER FOR MAXIMUM DAILY DOSE OF 30 UNITS   levothyroxine (SYNTHROID) 137 MCG tablet TAKE 1 TABLET DAILY BEFORE BREAKFAST   LUMIGAN 0.01 % SOLN Place 1-2 drops into both eyes at bedtime.   ofloxacin (OCUFLOX) 0.3 % ophthalmic solution Place 1 drop into the left eye 4 (four) times daily.   ONETOUCH VERIO test strip USE TO TEST BLOOD SUGAR ONCE DAILY   pantoprazole (PROTONIX) 40 MG tablet TAKE 1 TABLET DAILY   prednisoLONE acetate (PRED FORTE) 1 % ophthalmic suspension PLEASE SEE ATTACHED FOR DETAILED DIRECTIONS   tiZANidine (ZANAFLEX) 4 MG capsule TAKE 1 CAPSULE THREE TIMES A DAY AS NEEDED FOR MUSCLE SPASMS   tolterodine (DETROL LA) 4 MG 24 hr capsule Take 1 capsule (4 mg total) by mouth 2 (two) times daily.   amoxicillin (AMOXIL) 500 MG capsule TAKE 4 CAPSULES BY MOUTH 1 HOUR PRIOR TO DENTAL  PROCEDURE   insulin lispro (HUMALOG KWIKPEN) 100 UNIT/ML KwikPen Inject 5 Units into the skin 3 (three) times daily. (Patient not taking: Reported on 03/14/2021)   Lancets 30G MISC Use to check blood sugars once daily.   lipase/protease/amylase (CREON) 36000 UNITS CPEP capsule Take 2 capsules (72,000 Units total) by mouth 3 (three) times daily with meals. May also take 1 capsule (36,000 Units total) as needed (with snacks). (Patient not taking: Reported on 03/14/2021)   nystatin cream (MYCOSTATIN) APPLY TOPICALLY TWICE A DAY   No facility-administered encounter medications on file as of  03/14/2021.    Allergies (verified) Erythromycin, Fentanyl, Iodinated diagnostic agents, Atorvastatin, Metformin and related, Victoza [liraglutide], Latex, Lisinopril, and Other   History: Past Medical History:  Diagnosis Date   Anxiety    Arthritis    Choledocholithiasis    Chronic back pain    stenosis and arthritis   Complication of anesthesia    anxious when she wakes up   GERD (gastroesophageal reflux disease)    takes Pantoprazole daily   Glaucoma    dry angle   History of bronchitis    30+ yrs ago   History of MRSA infection 2004   several yrs ago   Hyperlipidemia    takes Zetia and Crestor daily   Hypertension    take Amlodipine daily   Hypothyroidism    takes Synthroid daily   Joint pain    Joint swelling    Neuropathy    takes Gabapentin daily   Pancreatitis, gallstone    Peripheral edema    takes Furosemide daily as needed   Pneumonia    hx of-30 + yrs ago   Pre-diabetes    takes Victoza daily   Primary localized osteoarthritis of right knee    Subdural hematoma 8 yrs ago   hx of   Urinary frequency    Urinary urgency    Past Surgical History:  Procedure Laterality Date   ABDOMINAL HYSTERECTOMY     CHOLECYSTECTOMY N/A 09/09/2016   Procedure: LAPAROSCOPIC CHOLECYSTECTOMY;  Surgeon: Manus Rudd, MD;  Location: [facility] OR;  Service: General;  Laterality: N/A;   COLONOSCOPY     RADIOLOGY WITH ANESTHESIA N/A 08/22/2015   Procedure: MRI OF LUMBAR SPINE   (RADIOLOGY WITH ANESTHESIA);  Surgeon: Medication Radiologist, MD;  Location: [facility] OR;  Service: Radiology;  Laterality: N/A;   TOTAL HIP ARTHROPLASTY Right    TOTAL HIP ARTHROPLASTY Left    TOTAL KNEE ARTHROPLASTY Left    TOTAL KNEE ARTHROPLASTY Right 01/01/2016   Procedure: TOTAL KNEE ARTHROPLASTY;  Surgeon: Salvatore Marvel, MD;  [facility];  Service: Orthopedics;  Laterality: Right;   Family History  Problem Relation Age of Onset   Cancer Father    Cancer Brother    Diabetes Maternal Aunt     Social History   Socioeconomic History   Marital status: Widowed    Spouse name: Not on file   Number of children: Not on file   Years of education: Not on file   Highest education level: Not on file  Occupational History   Not on file  Tobacco Use   Smoking status: Never   Smokeless tobacco: Never  Vaping Use   Vaping Use: Never used  Substance and Sexual Activity   Alcohol use: No    Alcohol/week: 0.0 standard drinks   Drug use: No   Sexual activity: Not on file  Other Topics Concern   Not on file  Social History Narrative   Not on file  Social Determinants of Health   Financial Resource Strain: Low Risk    Difficulty of Paying Living Expenses: Not hard at all  Food Insecurity: No Food Insecurity   Worried About Programme researcher, broadcasting/film/video in the Last Year: Never true   Ran Out of Food in the Last Year: Never true  Transportation Needs: No Transportation Needs   Lack of Transportation (Medical): No   Lack of Transportation (Non-Medical): No  Physical Activity: Not on file  Stress: No Stress Concern Present   Feeling of Stress : Not at all  Social Connections: Unknown   Frequency of Communication with Friends and Family: More than three times a week   Frequency of Social Gatherings with Friends and Family: Not on file   Attends Religious Services: Not on Scientist, clinical (histocompatibility and immunogenetics) or Organizations: Not on file   Attends Banker Meetings: Not on file   Marital Status: Not on file    Tobacco Counseling Counseling given: Not Answered   Clinical Intake:  Pre-visit preparation completed: Yes        Diabetes: Yes (Followed by PCP)  How often do you need to have someone help you when you read instructions, pamphlets, or other written materials from your doctor or pharmacy?: 1 - Never   Diabetes Management: Is the patient being seen by Chronic Care Management for management of their diabetes?  Yes     Activities of Daily Living In your  present state of health, do you have any difficulty performing the following activities: 03/14/2021  Hearing? N  Vision? N  Difficulty concentrating or making decisions? N  Walking or climbing stairs? Y  Comment Ambulates with wheelchair/walker  Dressing or bathing? N  Doing errands, shopping? N  Preparing Food and eating ? N  Using the Toilet? N  In the past six months, have you accidently leaked urine? N  Do you have problems with loss of bowel control? N  Managing your Medications? N  Managing your Finances? N  Housekeeping or managing your Housekeeping? N  Some recent data might be hidden    Patient Care Team: Sherlene Shams, MD as PCP - General (Internal Medicine) Lourena Simmonds, RPH-CPP (Pharmacist)  Indicate any recent Medical Services you may have received from other than [facility] providers in the past year (date may be approximate).     Assessment:   This is a routine wellness examination for [place].  Virtual Visit via Telephone Note  I connected with  [name] on 03/14/21 at  3:30 PM EST by telephone and verified that I am speaking with the correct person using two identifiers.  Persons participating in the virtual visit: patient/Nurse Health Advisor   I discussed the limitations, risks, security and privacy concerns of performing an evaluation and management service by telephone and the availability of in person appointments. The patient expressed understanding and agreed to proceed.  Interactive audio and video telecommunications were attempted between this nurse and patient, however failed, due to patient having technical difficulties OR patient did not have access to video capability.  We continued and completed visit with audio only.  Some vital signs may be absent or patient reported.   Hearing/Vision screen Hearing Screening - Comments:: Patient is able to hear conversational tones without difficulty.  No issues reported. Vision Screening -  Comments:: Followed by Dr. Dorothey Baseman [facility]  Wears corrective lenses  They have regular follow up with the ophthalmologist  Dietary issues and exercise  activities discussed: Current Exercise Habits: Home exercise routine, Type of exercise: stretching, Intensity: Mild Low carb  Good water intake   Goals Addressed               This Visit's Progress     Patient Stated     I plan to start the GOLO diet (pt-stated)        Portion control       Depression Screen PHQ 2/9 Scores 03/14/2021 08/16/2020 12/28/2019 12/25/2018 12/23/2017 06/25/2017 05/24/2016  PHQ - 2 Score 0 0 0 0 0 0 0  PHQ- 9 Score - - - - - 1 -    Fall Risk Fall Risk  03/14/2021 08/16/2020 04/05/2020 12/31/2019 12/28/2019  Falls in the past year? 0 0 0 0 0  Number falls in past yr: 0 0 - - 0  Injury with Fall? - 0 - - -  Risk for fall due to : Impaired balance/gait - - Impaired mobility -  Follow up Falls evaluation completed Falls evaluation completed Falls evaluation completed Falls evaluation completed Falls evaluation completed    FALL RISK PREVENTION PERTAINING TO THE HOME: Home free of loose throw rugs in walkways, pet beds, electrical cords, etc? Yes  Adequate lighting in your home to reduce risk of falls? Yes   ASSISTIVE DEVICES UTILIZED TO PREVENT FALLS: Life alert? No  Use of a cane, walker or w/c? Yes  Grab bars in the bathroom? Yes  Shower chair or bench in shower? Yes  Elevated toilet seat or a handicapped toilet? Yes   TIMED UP AND GO: Was the test performed? No .   Cognitive Function: Patient is alert and oriented x3.  MMSE - Mini Mental State Exam 12/23/2017  Orientation to time 5  Orientation to Place 5  Registration 3  Attention/ Calculation 5  Recall 2  Language- name 2 objects 2  Language- repeat 1  Language- follow 3 step command 3  Language- read & follow direction 1  Write a sentence 1  Copy design 1  Total score 29     6CIT Screen 12/28/2019 12/25/2018  What Year? 0  points 0 points  What month? 0 points 0 points  What time? 0 points 0 points  Count back from 20 - 0 points  Months in reverse - 0 points  Repeat phrase - 0 points  Total Score - 0    Immunizations Immunization History  Administered Date(s) Administered   Fluad Quad(high Dose 65+) 01/01/2019, 12/31/2019, 01/17/2021   Influenza, High Dose Seasonal PF 01/23/2015, 12/04/2015, 12/25/2016, 12/23/2017   Moderna Sars-Covid-2 Vaccination 04/07/2019, 05/05/2019, 03/02/2020   Pneumococcal Conjugate-13 04/25/2015   Pneumococcal Polysaccharide-23 02/05/2018   Tdap 01/20/2020   Shingrix Completed?: No.    Education has been provided regarding the importance of this vaccine. Patient has been advised to call insurance company to determine out of pocket expense if they have not yet received this vaccine. Advised may also receive vaccine at local pharmacy or Health Dept. Verbalized acceptance and understanding.  Screening Tests Health Maintenance  Topic Date Due   URINE MICROALBUMIN  12/30/2020   HEMOGLOBIN A1C  02/16/2021   COVID-19 Vaccine (4 - Booster for Moderna series) 03/30/2021 (Originally 04/27/2020)   Zoster Vaccines- Shingrix (1 of 2) 06/12/2021 (Originally 01/10/1991)   DEXA SCAN  03/14/2022 (Originally 01/09/2006)   OPHTHALMOLOGY EXAM  05/19/2021   FOOT EXAM  08/16/2021   TETANUS/TDAP  01/19/2030   Pneumonia Vaccine 61+ Years old  Completed   INFLUENZA VACCINE  Completed  HPV VACCINES  Aged Out   Health Maintenance Health Maintenance Due  Topic Date Due   URINE MICROALBUMIN  12/30/2020   HEMOGLOBIN A1C  02/16/2021   Mammogram- deferred per patient.   Bone density- deferred per patient.   Lung Cancer Screening: (Low Dose CT Chest recommended if Age 63-80 years, 30 pack-year currently smoking OR have quit w/in 15years.) does not qualify.   Hepatitis C Screening: does not qualify  Vision Screening: Recommended annual ophthalmology exams for early detection of glaucoma and  other disorders of the eye.  Dental Screening: Recommended annual dental exams for proper oral hygiene  Community Resource Referral / Chronic Care Management: CRR required this visit?  No   CCM required this visit?  No      Plan:   Keep all routine maintenance appointments.   I have personally reviewed and noted the following in the patients chart:   Medical and social history Use of alcohol, tobacco or illicit drugs  Current medications and supplements including opioid prescriptions. Not taking opioid.  Functional ability and status Nutritional status Physical activity Advanced directives List of other physicians Hospitalizations, surgeries, and ER visits in previous 12 months Vitals Screenings to include cognitive, depression, and falls Referrals and appointments  In addition, I have reviewed and discussed with patient certain preventive protocols, quality metrics, and best practice recommendations. A written personalized care plan for preventive services as well as general preventive health recommendations were provided to patient.     OBrien-Blaney, Geraldyn Shain L, LPN   16/12/9602      I have reviewed the above information and agree with above.   Duncan Dull, MD

## 2021-03-20 MED ORDER — PANTOPRAZOLE SODIUM 40 MG PO TBEC: 40.0000 mg | DELAYED_RELEASE_TABLET | Freq: Every day | ORAL | 3 refills | Status: DC

## 2021-03-20 MED ORDER — CELECOXIB 100 MG PO CAPS: 100.0000 mg | ORAL_CAPSULE | Freq: Two times a day (BID) | ORAL | 3 refills | Status: DC

## 2021-03-20 MED ORDER — PANCRELIPASE (LIP-PROT-AMYL) 36000-114000 UNITS PO CPEP: ORAL_CAPSULE | ORAL | 3 refills | Status: DC

## 2021-03-20 MED ORDER — LANTUS SOLOSTAR 100 UNIT/ML ~~LOC~~ SOPN
PEN_INJECTOR | SUBCUTANEOUS | 6 refills | Status: DC
Start: 2021-03-20 — End: 2021-04-25

## 2021-03-20 MED ORDER — FUROSEMIDE 20 MG PO TABS: ORAL_TABLET | ORAL | 3 refills | Status: DC

## 2021-03-20 MED ORDER — TOLTERODINE TARTRATE ER 4 MG PO CP24: 4.0000 mg | ORAL_CAPSULE | Freq: Two times a day (BID) | ORAL | 3 refills | Status: DC

## 2021-03-20 MED ORDER — AMLODIPINE BESYLATE 10 MG PO TABS: 10.0000 mg | ORAL_TABLET | Freq: Every day | ORAL | 3 refills | Status: DC

## 2021-03-20 MED ORDER — LEVOTHYROXINE SODIUM 137 MCG PO TABS: 137.0000 ug | ORAL_TABLET | Freq: Every day | ORAL | 3 refills | Status: DC

## 2021-03-20 MED ORDER — BD PEN NEEDLE MINI U/F 31G X 5 MM MISC: 3 refills | Status: DC

## 2021-03-20 MED ORDER — EZETIMIBE 10 MG PO TABS: 10.0000 mg | ORAL_TABLET | Freq: Every day | ORAL | 3 refills | Status: DC

## 2021-03-20 MED ORDER — TIZANIDINE HCL 4 MG PO CAPS: 4.0000 mg | ORAL_CAPSULE | Freq: Three times a day (TID) | ORAL | 0 refills | Status: DC | PRN

## 2021-03-20 MED ORDER — DICYCLOMINE HCL 20 MG PO TABS: 20.0000 mg | ORAL_TABLET | Freq: Four times a day (QID) | ORAL | 3 refills | Status: DC

## 2021-03-20 NOTE — Addendum Note (Signed)
Addended by: Sandy Salaam on: 03/20/2021 03:36 PM   Modules accepted: Orders

## 2021-03-20 NOTE — Telephone Encounter (Signed)
Per scheduler, patient is changing pharmacies to [facility]. Requests a 90 day supply of all prescriptions be sent to [facility] pharmacy. Routing to CMA.

## 2021-03-20 NOTE — Chronic Care Management (AMB) (Signed)
°  Care Management   Note  03/20/2021 Name: [name] MRN: [id number] DOB: [birth year]  Bonnie Hinton is a 80 y.o. year old female who is a primary care patient of Darrick Huntsman, Mar Daring, MD and is actively engaged with the care management team. I reached out to [name] by phone today to assist with re-scheduling a follow up visit with the Pharmacist  Follow up plan: Telephone appointment with care management team member scheduled for:04/09/2021  Penne Lash, RMA Care Guide, Embedded Care Coordination [facility]  [place], [place] [address] Direct Dial: [phone] [email or web address] Website: [email or web address]

## 2021-03-20 NOTE — Telephone Encounter (Signed)
All medications have been refilled and sent to [facility]

## 2021-03-30 NOTE — Telephone Encounter (Signed)
Patient Called in stated she would like to speak with Darrick Huntsman or Shanda Bumps about medication refill. Please call patient @ [phone]. Patient stated the [facility] is trying to reach Dr Darrick Huntsman because medication Furosemide direction is not clear

## 2021-04-02 ENCOUNTER — Other Ambulatory Visit: Payer: Self-pay | Admitting: Internal Medicine

## 2021-04-02 ENCOUNTER — Telehealth: Payer: Self-pay | Admitting: Internal Medicine

## 2021-04-02 DIAGNOSIS — N3941 Urge incontinence: Secondary | ICD-10-CM

## 2021-04-02 MED ORDER — CETIRIZINE HCL 10 MG PO TABS: 10.0000 mg | ORAL_TABLET | Freq: Every day | ORAL | 1 refills | Status: DC

## 2021-04-02 MED ORDER — BLOOD GLUCOSE METER KIT: 1.0000 | PACK | Freq: Every day | 0 refills | Status: DC

## 2021-04-02 MED ORDER — TIZANIDINE HCL 4 MG PO CAPS: 4.0000 mg | ORAL_CAPSULE | Freq: Three times a day (TID) | ORAL | 0 refills | Status: DC | PRN

## 2021-04-02 MED ORDER — TIZANIDINE HCL 4 MG PO TABS: 4.0000 mg | ORAL_TABLET | Freq: Four times a day (QID) | ORAL | 0 refills | Status: DC | PRN

## 2021-04-02 MED ORDER — NYSTATIN 100000 UNIT/GM EX CREA: TOPICAL_CREAM | Freq: Two times a day (BID) | CUTANEOUS | 2 refills | Status: DC

## 2021-04-02 MED ORDER — FUROSEMIDE 20 MG PO TABS: 20.0000 mg | ORAL_TABLET | ORAL | 3 refills | Status: DC

## 2021-04-02 MED ORDER — AMOXICILLIN 500 MG PO CAPS: ORAL_CAPSULE | ORAL | 2 refills | Status: DC

## 2021-04-02 NOTE — Telephone Encounter (Signed)
Patient called and said she has to pay for the tiZANidine (ZANAFLEX) 4 MG capsule, would like it changed to tablet and she will not have any cost to her.

## 2021-04-02 NOTE — Telephone Encounter (Signed)
Spoke with pt to find out if she was still taking the Humalog because the chart states pt is not taking. Pt stated that the she takes it sometimes but isn't sure if she is supposed to be taking it or not. I did not refill. Pt is scheduled with Catie next week and Dr. Darrick Huntsman on 04/25/2021.   Pt also requested a refill of the Lumigan eye drops and the Restasis eye drops. Both are historical medications so wasn't sure if okay to send in? If the Restasis can be sent in she stated that it needs to be sent in as Restasis because it will be free unlike the generic.

## 2021-04-02 NOTE — Telephone Encounter (Signed)
Spoke with pt to let her know to not take the Humalog until she has been seen by Catie or Dr. Derrel Nip to determine if it is still needed. Pt gave a verbal understanding. Pt stated that she needs a rx of the Amoxicillin sent in because she has some up coming dental work. Pt would like it sent to her mail order pharmacy [facility].

## 2021-04-02 NOTE — Addendum Note (Signed)
Addended by: Sherlene Shams on: 04/02/2021 04:49 PM   Modules accepted: Orders

## 2021-04-02 NOTE — Telephone Encounter (Signed)
Error

## 2021-04-02 NOTE — Telephone Encounter (Signed)
Patient called and stated that she received a phone call from  [facility] about her tiZANidine (ZANAFLEX) 4 MG capsule. Patient was told by her pharmacy that there is a issue on this medication and they are waiting to hear from patient's provider. She only has a few days remaining on this medication.  Patient is no longer with [facility], she is with [facility]. Per patient, AS of  Friday there are 9 medications that need to be sent to [facility]. Please fax to [phone] and phone number is [phone]. She would like a phone call back from office . FYI: patient now has [employer],

## 2021-04-02 NOTE — Telephone Encounter (Signed)
Amoxicillin #4 tablets sent to mail order.  Given her confusion with meds  ,  I did NOT refill for #20

## 2021-04-02 NOTE — Addendum Note (Signed)
Addended by: Sandy Salaam on: 04/02/2021 12:38 PM   Modules accepted: Orders

## 2021-04-02 NOTE — Telephone Encounter (Signed)
CJ called in from [facility] stating the Lasix 20 mg prescription direction was confusing. It says take one tablet daily but it also says take one tablet every other day. CJ wants to know which is the correct direction and if it is everyday then they would need a prescription for 90 tablets.

## 2021-04-02 NOTE — Telephone Encounter (Signed)
Attempted to call pt. No answer no voicemail.  

## 2021-04-02 NOTE — Telephone Encounter (Signed)
[facility] called with pt on line in regards to pts medication refill. Pt is requesting prescriptions be sent to this pharmacy due to pt switching pharmacies.  amoxicillin (AMOXIL) 500 MG capsule cetirizine (ZYRTEC) 10 MG tablet cycloSPORINE (RESTASIS) 0.05 % ophthalmic emulsion insulin lispro (HUMALOG KWIKPEN) 100 UNIT/ML KwikPen Lancets 30G MISC LUMIGAN 0.01 % SOLN nystatin cream (MYCOSTATIN) tiZANidine (ZANAFLEX) 4 MG capsule    Pt would also like to know is it imparitive for her to have the one touch meter. Pt would like to try either acucheck guide me, acucheck guide or trumetric. Pt would like this sent to [facility]

## 2021-04-02 NOTE — Telephone Encounter (Signed)
Will discuss with patient at appointment next week

## 2021-04-02 NOTE — Addendum Note (Signed)
Addended by: Sandy Salaam on: 04/02/2021 02:32 PM   Modules accepted: Orders

## 2021-04-09 ENCOUNTER — Ambulatory Visit (INDEPENDENT_AMBULATORY_CARE_PROVIDER_SITE_OTHER): Payer: Medicare PPO | Admitting: Pharmacist

## 2021-04-09 ENCOUNTER — Telehealth: Payer: Self-pay | Admitting: Pharmacist

## 2021-04-09 DIAGNOSIS — E782 Mixed hyperlipidemia: Secondary | ICD-10-CM

## 2021-04-09 DIAGNOSIS — R197 Diarrhea, unspecified: Secondary | ICD-10-CM

## 2021-04-09 DIAGNOSIS — M791 Myalgia, unspecified site: Secondary | ICD-10-CM

## 2021-04-09 DIAGNOSIS — E118 Type 2 diabetes mellitus with unspecified complications: Secondary | ICD-10-CM

## 2021-04-09 DIAGNOSIS — E039 Hypothyroidism, unspecified: Secondary | ICD-10-CM

## 2021-04-09 DIAGNOSIS — I10 Essential (primary) hypertension: Secondary | ICD-10-CM

## 2021-04-09 DIAGNOSIS — Z9889 Other specified postprocedural states: Secondary | ICD-10-CM

## 2021-04-09 DIAGNOSIS — N3941 Urge incontinence: Secondary | ICD-10-CM

## 2021-04-09 MED ORDER — FUROSEMIDE 20 MG PO TABS: 20.0000 mg | ORAL_TABLET | ORAL | 3 refills | Status: DC

## 2021-04-09 MED ORDER — ACCU-CHEK GUIDE W/DEVICE KIT: PACK | 1 refills | Status: DC

## 2021-04-09 MED ORDER — ACCU-CHEK SOFTCLIX LANCETS MISC: 12 refills | Status: DC

## 2021-04-09 MED ORDER — ACCU-CHEK GUIDE VI STRP: ORAL_STRIP | 12 refills | Status: DC

## 2021-04-09 NOTE — Patient Instructions (Addendum)
Bonnie Hinton,   We will see if your new insurance plan covers the [facility] Continuous Glucose Monitor.   I will collaborate with [facility] and Dr. Derrel Nip to figure out what is going on with the tizanidine and tolterodine.   We recommend you get the updated bivalent COVID-19 booster, at least 2 months after any prior doses. You may consider delaying a booster dose by 3 months from a prior episode of COVID-19 per the CDC.   We recommend the Shingrix (shingles) vaccine series for all over age 81. It should have a $0 copay on all Medicare plans this year. You can pursue this without a prescription at your local pharmacy, or feel free to call our [place] at [facility] at [phone].   Take care!  [name], PharmD  Visit Information  Following are the goals we discussed today:    Patient Goals/Self-Care Activities Over the next 90 days, patient will:  - take medications as prescribed check blood glucose three times daily using CGM, document, and provide at future appointments        Plan: Telephone follow up appointment with care management team member scheduled for:  10 weeks   [name], PharmD, [place], CPP Clinical Pharmacist [place] at [facility] [phone]     Please call the care guide team at [phone] if you need to cancel or reschedule your appointment.   The patient verbalized understanding of instructions, educational materials, and care plan provided today and agreed to receive a mailed copy of patient instructions, educational materials, and care plan.

## 2021-04-09 NOTE — Chronic Care Management (AMB) (Signed)
Chronic Care Management CCM Pharmacy Note  04/09/2021 Name:  Bonnie Hinton MRN:  [id number] DOB:  10/05/1940  Summary: - Reviewed patient current medication list.   Recommendations/Changes made from today's visit: - Will collaborate w/ PCP and [facility] to determine needs for filling tolterodine and tizanidine therapy - Recommended to continue current medication regimen at this time. Follow for labs. Pursuing CGM coverage on new [employer] plan  Subjective: Bonnie Hinton is an 81 y.o. year old female who is a primary patient of Tullo, Aris Everts, MD.  The CCM team was consulted for assistance with disease management and care coordination needs.    Engaged with patient by telephone for follow up visit for pharmacy case management and/or care coordination services.   Objective:  Medications Reviewed Today     Reviewed by De Hollingshead, RPH-CPP (Pharmacist) on 04/09/21 at 1344  Med List Status: <None>   Medication Order Taking? Sig Documenting Provider Last Dose Status Informant  Accu-Chek Softclix Lancets lancets [id number] Yes Use as instructed to check blood sugar at least 3 times daily Crecencio Mc, MD  Active   acetaminophen (TYLENOL) 325 MG tablet [id number] Yes Take 2 tablets (650 mg total) by mouth every 6 (six) hours as needed for mild pain (or Fever >/= 101). Shepperson, Kirstin, PA-C Taking Active Self  amLODipine (NORVASC) 10 MG tablet [id number] Yes Take 1 tablet (10 mg total) by mouth daily. Crecencio Mc, MD Taking Active   amoxicillin (AMOXIL) 500 MG capsule [id number] No TAKE 4 CAPSULES BY MOUTH 1 HOUR PRIOR TO DENTAL PROCEDURE  Patient not taking: Reported on 04/09/2021   Crecencio Mc, MD Not Taking Active   Blood Glucose Monitoring Suppl (ACCU-CHEK GUIDE) w/Device KIT [id number] Yes Use to check blood sugars at least 3 times daily Crecencio Mc, MD Taking Active   celecoxib (CELEBREX) 100 MG capsule [id number] Yes Take 1 capsule (100 mg total) by  mouth 2 (two) times daily. Crecencio Mc, MD Taking Active   cetirizine (ZYRTEC) 10 MG tablet [id number] Yes Take 1 tablet (10 mg total) by mouth daily. Crecencio Mc, MD Taking Active   Cholecalciferol (VITAMIN D3) 2000 units TABS [id number] Yes Take 1 tablet by mouth daily. [provider] Taking Active   cycloSPORINE (RESTASIS) 0.05 % ophthalmic emulsion [id number] Yes Place 1 drop into both eyes 2 (two) times daily. [provider] Taking Active Self  dicyclomine (BENTYL) 20 MG tablet [id number] Yes Take 1 tablet (20 mg total) by mouth 4 (four) times daily. Before meals and at bedtime Crecencio Mc, MD Taking Active   ezetimibe (ZETIA) 10 MG tablet [id number] Yes Take 1 tablet (10 mg total) by mouth daily. Crecencio Mc, MD Taking Active   furosemide (LASIX) 20 MG tablet [id number] Yes Take 1 tablet (20 mg total) by mouth every other day. TAKE 1 TABLET BY MOUTH EVERY OTHER DAY  AS NEEDED. FOR FLUID RETENTION Crecencio Mc, MD Taking Active   glucose blood (ACCU-CHEK GUIDE) test strip [id number] Yes Use as instructed Crecencio Mc, MD Taking Active   insulin glargine (LANTUS SOLOSTAR) 100 UNIT/ML Solostar Pen [id number] Yes AT THE START OF THERAPY INJECT 10 UNITS UNDER THE SKIN DAILY, TITRATE AS INSTRUCTED THEREAFTER FOR MAXIMUM DAILY DOSE OF 30 UNITS Crecencio Mc, MD Taking Active            Med Note Mayo Ao Apr 09, 2021  1:42 PM) 22 units daily  insulin lispro (  HUMALOG KWIKPEN) 100 UNIT/ML KwikPen [id number] No Inject 5 Units into the skin 3 (three) times daily.  Patient not taking: Reported on 03/14/2021   Crecencio Mc, MD Not Taking Active   Insulin Pen Needle (B-D UF III MINI PEN NEEDLES) 31G X 5 MM MISC [id number] Yes USE 1 NEEDLE DAILY Crecencio Mc, MD Taking Active   levothyroxine (SYNTHROID) 137 MCG tablet [id number] Yes Take 1 tablet (137 mcg total) by mouth daily before breakfast. Crecencio Mc, MD Taking Active   LUMIGAN 0.01 % SOLN  [id number] Yes Place 1-2 drops into both eyes at bedtime. Crecencio Mc, MD Taking Active   nystatin cream (MYCOSTATIN) [id number] Yes Apply topically 2 (two) times daily. Crecencio Mc, MD Taking Active   pantoprazole (PROTONIX) 40 MG tablet [id number] Yes Take 1 tablet (40 mg total) by mouth daily. Crecencio Mc, MD Taking Active   tiZANidine (ZANAFLEX) 4 MG tablet [id number] No Take 1 tablet (4 mg total) by mouth every 6 (six) hours as needed for muscle spasms.  Patient not taking: Reported on 04/09/2021   Crecencio Mc, MD Not Taking Active   tolterodine (DETROL LA) 4 MG 24 hr capsule [id number] Yes Take 1 capsule (4 mg total) by mouth 2 (two) times daily. Crecencio Mc, MD Taking Active             Pertinent Labs:   Lab Results  Component Value Date   HGBA1C 8.4 (H) 08/16/2020   Lab Results  Component Value Date   CHOL 231 (H) 08/16/2020   HDL 46.90 08/16/2020   LDLCALC 210 (H) 12/25/2016   LDLDIRECT 162.0 08/16/2020   TRIG 234.0 (H) 08/16/2020   CHOLHDL 5 08/16/2020   Lab Results  Component Value Date   CREATININE 0.84 08/16/2020   BUN 16 08/16/2020   NA 137 08/16/2020   K 4.3 08/16/2020   CL 99 08/16/2020   CO2 27 08/16/2020    SDOH:  (Social Determinants of Health) assessments and interventions performed:  SDOH Interventions    Flowsheet Row Most Recent Value  SDOH Interventions   Financial Strain Interventions Intervention Not Indicated       CCM Care Plan  Review of patient past medical history, allergies, medications, health status, including review of consultants reports, laboratory and other test data, was performed as part of comprehensive evaluation and provision of chronic care management services.   Care Plan : Medication Management  Updates made by De Hollingshead, RPH-CPP since 04/09/2021 12:00 AM     Problem: Diabetes      Long-Range Goal: Disease Progression Prevention   Recent Progress: On track  Priority: High  Note:    Current Barriers:  Unable to achieve control of diabetes  Does not adhere to prescribed medication regimen  Pharmacist Clinical Goal(s):  Over the next 90 days, patient will improve control of diabetes as evidenced by improvement in A1c. Over the next 90 days, patient will adhere to prescribed medication regimen.  Interventions: 1:1 collaboration with Crecencio Mc, MD regarding development and update of comprehensive plan of care as evidenced by provider attestation and co-signature Inter-disciplinary care team collaboration (see longitudinal plan of care) Comprehensive medication review performed; medication list updated in electronic medical record   Diabetes: Uncontrolled; current treatment: Lantus 22 units daily; prescribed Humalog but has not been taking Hx Victoza, but was discontinued after an episode of gallstone pancreatitis (recommended by GI providers while hospitalized), will avoid GLP1 moving forward Hx metformin XR, patient d/c  after diarrhea Unsure if patient would tolerate SGLT2 d/t baseline urinary incontinence  Current blood sugar readings: fasting: post prandial: 179 Current meal patterns: GOLO diet; lean proteins limited to 3 oz; vegetables (~1/2-1 cup depending on meal); eggs, yogurt, brown rice, limited carbs; fruit Discussed pursuing cost and coverage of CGM given new insurance. Will pursue Continue current regimen at this time with Lantus monotherapy. Due for lab work prior to PCP follow up. Will use to determine appropriate control  Hypertension: Controlled; current treatment: amlodipine 10 mg daily, furosemide 20 mg every other day Home BP readings: unknown, not checking lately  Recommended to continue current treatment regimen. Will discuss checking home blood pressure readings   Hyperlipidemia: Uncontrolled; current treatment: ezetimibe 10 mg daily;  Medications previously tried: Reports hx muscle aches and GI upset with atorvastatin, rosuvastatin 5 mg  daily. Refuses retrial. At this time, not eligible for PCSK9i or Nexletol as she does not have hx ASCVD.  Given age and intolerances, previously recommended to current regimen at this time  IBS w/ gallbladder removal: Improved; current regimen: dicyclomine 20 mg QID PRN; pantoprazole 40 mg daily Reports she discontinued Creon due to diarrhea and stomach upset afterwards.  Recommended to continue current regimen at this time.  GERD: Controlled per patient report, current treatment: pantoprazole 40 mg daily Previously recommended to continue current treatment regimen.  Could consider de-escalation to PRN famotidine or tums instead. Will discuss moving forward.   Arthritis Pain s/p bilateral hip and knee replacements Controlled, current treatment regimen: celecoxib 100 mg BID, acetaminophen PRN, tizanidine 4 mg TID PRN (sometimes 3-4 doses daily) Reports that she confirmed with the pharmacy that they would cover tizanidine 4 mg tablet TID PRN - will follow up with pharmacy moving forward.  Previously encouraged to continue current regimen with moderation with tizanidine use   OAB/Urinary Incontience: Moderately well controlled per patient report; taking tolterodine LA 4 mg daily- was increased to twice daily, however, appears insurance is not covering LA twice daily per patient report Recommended to reduce tolterodine to recommended once daily dosing. If benefit insufficient, recommend referral to urology.   Over the Counter Medications: Current regimen: cetirizine 10 mg daily for allergies, Vitamin D 1000 units daily No interactions noted. Previously recommended to continue current regimen   Patient Goals/Self-Care Activities Over the next 90 days, patient will:  - take medications as prescribed check blood glucose three times daily using CGM, document, and provide at future appointments      Plan: Telephone follow up appointment with care management team member scheduled for:  10  weeks  [name], PharmD, [place], [facility] Pharmacist [employer] at [facility] [phone]

## 2021-04-09 NOTE — Telephone Encounter (Signed)
Furosemide issues resolved.

## 2021-04-09 NOTE — Telephone Encounter (Signed)
Patient notes that she spoke with [facility] and that:    - Tizanidine needs to be written for three times daily as needed for muscle spasms. Difficult to tell from the patient if changing from capsules to tablets solved the issue - Tolterodine LA will not be covered for BID administration, must be once daily. The [facility] pharmacists apparently suggested completing a PA for this, if twice daily is necessary.   Attempted to call [facility] to clarify the above. The provider line is closed today. Will attempt call later this week

## 2021-04-10 NOTE — Telephone Encounter (Signed)
PA for tolterodine 4 mg BID was approved 03/25/2021-03/14/22. Called [facility] and asked that they fill the medication. They will process as soon a PA approval is available on their end. Notified patient.   Still awaiting processing of Libre 2 on [employer] to see if covered. Script sent to [facility] via [facility] platform yesterday

## 2021-04-10 NOTE — Telephone Encounter (Signed)
Completed PA for tolterodine 4 mg LA two capsules daily via Cover My Meds (Key [id number]). Will follow for outcome.   Called [facility]. They note that tizanidine capsules, take 1 capsule by mouth TID PRN for a 90 day supply was shipped out 04/02/21 for a $5 copay.   Called patient. Notified of the above. She notes she will try the capsules but will fill the tablet script next time. Also discussed reducing her current supply of tolterodine to once daily, as I anticipate PA will be denied as the FDA has only labeled tolterodine LA to be taken once daily. She verbalizes understanding

## 2021-04-12 ENCOUNTER — Other Ambulatory Visit: Payer: Self-pay | Admitting: Internal Medicine

## 2021-04-17 ENCOUNTER — Telehealth: Payer: Self-pay | Admitting: Internal Medicine

## 2021-04-17 NOTE — Telephone Encounter (Signed)
Patient called Bonnie Hinton back. Please call her.

## 2021-04-17 NOTE — Telephone Encounter (Signed)
Called patient. She reports [facility] called today and they were reviewing information, and she confused about what her current insulin regimen is. She requests that I call [facility] to clarify information. Will call tomorrow morning.

## 2021-04-17 NOTE — Telephone Encounter (Signed)
Patient would like a phone call back from Catie today because [facility] called patient and it's about the new Libre. Patient states she messed up when speaking to her insurance. She told them a different insulin and now the insurance needs to speak to office.

## 2021-04-18 NOTE — Telephone Encounter (Signed)
Called [facility]. They need updated clinical notes documenting patient's regimen. Faxed my last office visit to [place] - [phone], patient ID [id number].   Will call to follow up in 2-3 business days ([phone])

## 2021-04-20 ENCOUNTER — Other Ambulatory Visit (INDEPENDENT_AMBULATORY_CARE_PROVIDER_SITE_OTHER): Payer: Medicare PPO

## 2021-04-20 ENCOUNTER — Other Ambulatory Visit: Payer: Self-pay

## 2021-04-20 DIAGNOSIS — E782 Mixed hyperlipidemia: Secondary | ICD-10-CM | POA: Diagnosis not present

## 2021-04-20 DIAGNOSIS — E118 Type 2 diabetes mellitus with unspecified complications: Secondary | ICD-10-CM | POA: Diagnosis not present

## 2021-04-20 DIAGNOSIS — E039 Hypothyroidism, unspecified: Secondary | ICD-10-CM

## 2021-04-20 LAB — COMPREHENSIVE METABOLIC PANEL
ALT: 14 U/L (ref 0–35)
AST: 19 U/L (ref 0–37)
Albumin: 4.4 g/dL (ref 3.5–5.2)
Alkaline Phosphatase: 74 U/L (ref 39–117)
BUN: 19 mg/dL (ref 6–23)
CO2: 25 mEq/L (ref 19–32)
Calcium: 9.9 mg/dL (ref 8.4–10.5)
Chloride: 101 mEq/L (ref 96–112)
Creatinine, Ser: 0.86 mg/dL (ref 0.40–1.20)
GFR: 63.87 mL/min (ref >60.00)
Glucose, Bld: 177 mg/dL — ABNORMAL HIGH (ref 70–99)
Potassium: 4.8 mEq/L (ref 3.5–5.1)
Sodium: 137 mEq/L (ref 135–145)
Total Bilirubin: 0.5 mg/dL (ref 0.2–1.2)
Total Protein: 7.5 g/dL (ref 6.0–8.3)

## 2021-04-20 LAB — LIPID PANEL
Cholesterol: 242 mg/dL — ABNORMAL HIGH (ref 0–200)
HDL: 52.4 mg/dL (ref >39.00)
LDL Cholesterol: 158 mg/dL — ABNORMAL HIGH (ref 0–99)
NonHDL: 189.21
Total CHOL/HDL Ratio: 5
Triglycerides: 154 mg/dL — ABNORMAL HIGH (ref 0.0–149.0)
VLDL: 30.8 mg/dL (ref 0.0–40.0)

## 2021-04-20 LAB — MICROALBUMIN / CREATININE URINE RATIO
Creatinine,U: 107.8 mg/dL
Microalb Creat Ratio: 13.5 mg/g (ref 0.0–30.0)
Microalb, Ur: 14.6 mg/dL — ABNORMAL HIGH (ref 0.0–1.9)

## 2021-04-20 LAB — TSH: TSH: 3.44 u[IU]/mL (ref 0.35–5.50)

## 2021-04-23 LAB — HEMOGLOBIN A1C: Hgb A1c MFr Bld: 8.6 % — ABNORMAL HIGH (ref 4.6–6.5)

## 2021-04-24 DIAGNOSIS — E118 Type 2 diabetes mellitus with unspecified complications: Secondary | ICD-10-CM

## 2021-04-24 DIAGNOSIS — E039 Hypothyroidism, unspecified: Secondary | ICD-10-CM | POA: Diagnosis not present

## 2021-04-24 DIAGNOSIS — E782 Mixed hyperlipidemia: Secondary | ICD-10-CM

## 2021-04-24 DIAGNOSIS — I10 Essential (primary) hypertension: Secondary | ICD-10-CM | POA: Diagnosis not present

## 2021-04-25 ENCOUNTER — Ambulatory Visit: Payer: Medicare PPO | Admitting: Internal Medicine

## 2021-04-25 ENCOUNTER — Other Ambulatory Visit: Payer: Self-pay

## 2021-04-25 ENCOUNTER — Ambulatory Visit (INDEPENDENT_AMBULATORY_CARE_PROVIDER_SITE_OTHER): Payer: Medicare PPO | Admitting: Pharmacist

## 2021-04-25 ENCOUNTER — Encounter: Payer: Self-pay | Admitting: Internal Medicine

## 2021-04-25 ENCOUNTER — Telehealth: Payer: Self-pay

## 2021-04-25 VITALS — BP 140/88 | HR 96 | Temp 97.9°F | Ht 65.0 in | Wt 289.0 lb

## 2021-04-25 DIAGNOSIS — G720 Drug-induced myopathy: Secondary | ICD-10-CM | POA: Diagnosis not present

## 2021-04-25 DIAGNOSIS — Z8673 Personal history of transient ischemic attack (TIA), and cerebral infarction without residual deficits: Secondary | ICD-10-CM

## 2021-04-25 DIAGNOSIS — T466X5A Adverse effect of antihyperlipidemic and antiarteriosclerotic drugs, initial encounter: Secondary | ICD-10-CM

## 2021-04-25 DIAGNOSIS — E782 Mixed hyperlipidemia: Secondary | ICD-10-CM

## 2021-04-25 DIAGNOSIS — E118 Type 2 diabetes mellitus with unspecified complications: Secondary | ICD-10-CM

## 2021-04-25 DIAGNOSIS — E1121 Type 2 diabetes mellitus with diabetic nephropathy: Secondary | ICD-10-CM

## 2021-04-25 DIAGNOSIS — T50905A Adverse effect of unspecified drugs, medicaments and biological substances, initial encounter: Secondary | ICD-10-CM

## 2021-04-25 DIAGNOSIS — R200 Anesthesia of skin: Secondary | ICD-10-CM

## 2021-04-25 DIAGNOSIS — N3941 Urge incontinence: Secondary | ICD-10-CM

## 2021-04-25 DIAGNOSIS — M48061 Spinal stenosis, lumbar region without neurogenic claudication: Secondary | ICD-10-CM

## 2021-04-25 DIAGNOSIS — R202 Paresthesia of skin: Secondary | ICD-10-CM

## 2021-04-25 DIAGNOSIS — E875 Hyperkalemia: Secondary | ICD-10-CM

## 2021-04-25 DIAGNOSIS — M791 Myalgia, unspecified site: Secondary | ICD-10-CM

## 2021-04-25 MED ORDER — FUROSEMIDE 20 MG PO TABS: 20.0000 mg | ORAL_TABLET | Freq: Every day | ORAL | 3 refills | Status: DC

## 2021-04-25 MED ORDER — LANTUS SOLOSTAR 100 UNIT/ML ~~LOC~~ SOPN: 25.0000 [IU] | PEN_INJECTOR | Freq: Every day | SUBCUTANEOUS | 6 refills | Status: DC

## 2021-04-25 MED ORDER — ZOSTER VAC RECOMB ADJUVANTED 50 MCG/0.5ML IM SUSR: 0.5000 mL | Freq: Once | INTRAMUSCULAR | 1 refills | Status: AC

## 2021-04-25 NOTE — Telephone Encounter (Signed)
-----   Message from Sherlene Shams, MD sent at 04/23/2021  1:22 PM EST ----- Your diabetes is under sub optimal  control on current regimen,   so we have some work to do . Please  bring a log of the last 2 weeks of blood sugars with you to your appointment.  I am interested in fastings and 2 hour post prandials (after a meal).  PLEASE DON'T FORGET !

## 2021-04-25 NOTE — Progress Notes (Signed)
Subjective:  Patient ID: Bonnie Hinton, female    DOB: Jan 22, 1941  Age: 81 y.o. MRN: [id number]  CC: The primary encounter diagnosis was Diabetic nephropathy associated with type 2 diabetes mellitus ([place]). Diagnoses of Statin myopathy, Drug-induced hyperkalemia, Urge incontinence of urine, Diabetes mellitus with complication (HCC), Numbness and tingling in right hand, Mixed hyperlipidemia, and Degenerative lumbar spinal stenosis were also pertinent to this visit.   This visit occurred during the SARS-CoV-2 public health emergency.  Safety protocols were in place, including screening questions prior to the visit, additional usage of staff PPE, and extensive cleaning of exam room while observing appropriate contact time as indicated for disinfecting solutions.    HPI Bonnie Hinton presents for follow up on type 2 DM,  morbid obesity,  chronic diarrhea and spinal stenosis Chief Complaint  Patient presents with   Follow-up    Follow up on diabetes   1)T2DM:  A1c rose from 8.4 to 8.6 over the last 10 months .  Patient taking Lantus  22 units daily.  Has not been checking BS   2) Morbid obesity:  not walking for exercise due to chronoic low back pain  aggravated by obesity and deconditioning.  W as losing a lb weekly on the [place] for 4 months and last weight at home was 289  . Got derailed by having to collect tax information   .  Has not resumed it yet .  Eating lean meats,   Root vegetables   3) HTN:  taking amlodipine and furosemide.  ACE c/I due to hyperkalemia and decrease in GFR . 4)   Renal artery doppler was never done as ordered May 2022  due to patient having diarrhea.    4) Chronic low back Pain due to severe lumbar spinal stenosis at the L4-5 level.  Does not want surgery.  Has not made much  progress in walking with walker due to pain in low back.  Admits she has been allowing herself  a pity party.  Using the walker in the house.  But outside of the house cannot stand  up straight.  pain is mostly with standing, tries to do some home exercises . Rec continued efforts at weight loss and repeat PT after 30 lbs   6) Fingers 2,3,4 on right hand are completely numb. Starting to affect her left hand as well.  Can't pick up things anymore  . Gets shooting pains on dorsal side of hand triggered by weather changes, especially cold. Occupational overuse as a hay farmer . Can't pickp up small things.   No neck radiculopathy   Outpatient Medications Prior to Visit  Medication Sig Dispense Refill   Accu-Chek Softclix Lancets lancets Use as instructed to check blood sugar at least 3 times daily 100 each 12   acetaminophen (TYLENOL) 325 MG tablet Take 2 tablets (650 mg total) by mouth every 6 (six) hours as needed for mild pain (or Fever >/= 101).     amLODipine (NORVASC) 10 MG tablet Take 1 tablet (10 mg total) by mouth daily. 90 tablet 3   Blood Glucose Monitoring Suppl (ACCU-CHEK GUIDE) w/Device KIT Use to check blood sugars at least 3 times daily 1 kit 1   celecoxib (CELEBREX) 100 MG capsule Take 1 capsule (100 mg total) by mouth 2 (two) times daily. 180 capsule 3   cetirizine (ZYRTEC) 10 MG tablet Take 1 tablet (10 mg total) by mouth daily. 90 tablet 1   Cholecalciferol (VITAMIN D3) 2000 units  TABS Take 1 tablet by mouth daily.     cycloSPORINE (RESTASIS) 0.05 % ophthalmic emulsion Place 1 drop into both eyes 2 (two) times daily.     dicyclomine (BENTYL) 20 MG tablet Take 1 tablet (20 mg total) by mouth 4 (four) times daily. Before meals and at bedtime 270 tablet 3   ezetimibe (ZETIA) 10 MG tablet Take 1 tablet (10 mg total) by mouth daily. 90 tablet 3   glucose blood (ACCU-CHEK GUIDE) test strip Use as instructed 100 each 12   Insulin Pen Needle (B-D UF III MINI PEN NEEDLES) 31G X 5 MM MISC USE 1 NEEDLE DAILY 90 each 3   levothyroxine (SYNTHROID) 137 MCG tablet Take 1 tablet (137 mcg total) by mouth daily before breakfast. 90 tablet 3   LUMIGAN 0.01 % SOLN Place 1-2  drops into both eyes at bedtime. 5 mL 0   nystatin cream (MYCOSTATIN) Apply topically 2 (two) times daily. 90 g 2   pantoprazole (PROTONIX) 40 MG tablet Take 1 tablet (40 mg total) by mouth daily. 90 tablet 3   tiZANidine (ZANAFLEX) 4 MG tablet Take 1 tablet (4 mg total) by mouth every 6 (six) hours as needed for muscle spasms. 270 tablet 0   tolterodine (DETROL LA) 4 MG 24 hr capsule Take 1 capsule (4 mg total) by mouth 2 (two) times daily. 180 capsule 3   furosemide (LASIX) 20 MG tablet Take 1 tablet (20 mg total) by mouth every other day. As needed for fluid retention 45 tablet 3   insulin glargine (LANTUS SOLOSTAR) 100 UNIT/ML Solostar Pen AT THE START OF THERAPY INJECT 10 UNITS UNDER THE SKIN DAILY, TITRATE AS INSTRUCTED THEREAFTER FOR MAXIMUM DAILY DOSE OF 30 UNITS 15 mL 6   amoxicillin (AMOXIL) 500 MG capsule TAKE 4 CAPSULES BY MOUTH 1 HOUR PRIOR TO DENTAL PROCEDURE (Patient not taking: Reported on 04/25/2021) 4 capsule 2   No facility-administered medications prior to visit.    Review of Systems;  Patient denies headache, fevers, malaise, unintentional weight loss, skin rash, eye pain, sinus congestion and sinus pain, sore throat, dysphagia,  hemoptysis , cough, dyspnea, wheezing, chest pain, palpitations, orthopnea, edema, abdominal pain, nausea, melena, diarrhea, constipation, flank pain, dysuria, hematuria, urinary  Frequency, nocturia, numbness, tingling, seizures,  Focal weakness, Loss of consciousness,  Tremor, insomnia, depression, anxiety, and suicidal ideation.      Objective:  BP 140/88 (BP Location: Left Arm, Patient Position: Sitting, Cuff Size: Large)    Pulse 96    Temp 97.9 F (36.6 C) (Oral)    Ht 5' 5"  (1.651 m)    Wt 289 lb (131.1 kg)    SpO2 97%    BMI 48.09 kg/m   BP Readings from Last 3 Encounters:  04/25/21 140/88  08/16/20 (!) 140/96  04/05/20 124/66    Wt Readings from Last 3 Encounters:  04/25/21 289 lb (131.1 kg)  03/14/21 (!) 323 lb (146.5 kg)   08/16/20 (!) 323 lb 4 oz (146.6 kg)    General appearance: alert, cooperative and appears stated age Ears: normal TM's and external ear canals both ears Throat: lips, mucosa, and tongue normal; teeth and gums normal Neck: no adenopathy, no carotid bruit, supple, symmetrical, trachea midline and thyroid not enlarged, symmetric, no tenderness/mass/nodules Back: symmetric, no curvature. ROM normal. No CVA tenderness. Lungs: clear to auscultation bilaterally Heart: regular rate and rhythm, S1, S2 normal, no murmur, click, rub or gallop Abdomen: soft, non-tender; bowel sounds normal; no masses,  no organomegaly Pulses: 2+  and symmetric Skin: Skin color, texture, turgor normal. No rashes or lesions Lymph nodes: Cervical, supraclavicular, and axillary nodes normal.  Lab Results  Component Value Date   HGBA1C 8.6 (H) 04/20/2021   HGBA1C 8.4 (H) 08/16/2020   HGBA1C 8.3 (A) 04/05/2020    Lab Results  Component Value Date   CREATININE 0.86 04/20/2021   CREATININE 0.84 08/16/2020   CREATININE 0.77 04/05/2020    Lab Results  Component Value Date   WBC 8.4 06/10/2018   HGB 14.9 06/10/2018   HCT 45.1 06/10/2018   PLT 306.0 06/10/2018   GLUCOSE 177 (H) 04/20/2021   CHOL 242 (H) 04/20/2021   TRIG 154.0 (H) 04/20/2021   HDL 52.40 04/20/2021   LDLDIRECT 162.0 08/16/2020   LDLCALC 158 (H) 04/20/2021   ALT 14 04/20/2021   AST 19 04/20/2021   NA 137 04/20/2021   K 4.8 04/20/2021   CL 101 04/20/2021   CREATININE 0.86 04/20/2021   BUN 19 04/20/2021   CO2 25 04/20/2021   TSH 3.44 04/20/2021   INR 1.37 09/04/2016   HGBA1C 8.6 (H) 04/20/2021   MICROALBUR 14.6 (H) 04/20/2021    DG Chest 2 View  Result Date: 09/04/2016 CLINICAL DATA:  Preop chest exam, bronchitis. EXAM: CHEST  2 VIEW COMPARISON:  None. FINDINGS: The heart size and mediastinal contours are within normal limits. Low lung volumes with mild increase in interstitial prominence suspicious for chronic bronchitic change. No  pneumonic consolidation, effusion or pneumothorax. Mild degenerative change along the dorsal spine. IMPRESSION: Mild interstitial prominence bilaterally which may reflect chronic bronchitic change. No pulmonary consolidations to suggest pneumonia. Electronically Signed   By: Ashley Royalty M.D.   On: 09/04/2016 13:51   MR ABDOMEN MRCP WO CONTRAST  Result Date: 09/04/2016 CLINICAL DATA:  Right upper quadrant abdominal pain. Pancreatitis with elevated liver function studies. EXAM: MRI ABDOMEN WITHOUT CONTRAST  (INCLUDING MRCP) TECHNIQUE: Multiplanar multisequence MR imaging of the abdomen was performed. Heavily T2-weighted images of the biliary and pancreatic ducts were obtained, and three-dimensional MRCP images were rendered by post processing. COMPARISON:  Ultrasound today and 04/06/2016. FINDINGS: Despite efforts by the technologist and patient, moderate motion artifact is present on today's exam and could not be eliminated. This reduces exam sensitivity and specificity. Lower chest: The visualized lower chest appears remarkable. No significant pleural effusion. Hepatobiliary: The liver demonstrates loss of signal on the gradient echo opposed phase images consistent with steatosis. No focal lesions are identified. Multiple small gallstones are present. There is no gallbladder wall thickening or surrounding inflammation. The ERCP images are degraded by motion. No significant biliary dilatation. No definite choledocholithiasis. Pancreas: Fatty replaced with mild surrounding edema. No focal fluid collection or ductal dilatation identified. Spleen: Normal in size without focal abnormality. Adrenals/Urinary Tract: Both adrenal glands appear normal. Tiny cyst in the lower pole of the left kidney. The right kidney appears normal. No hydronephrosis. Stomach/Bowel: No evidence of bowel wall thickening, distention or surrounding inflammatory change. Vascular/Lymphatic: Small lymph nodes in the porta hepatis, likely  reactive. No enlarged retroperitoneal lymph nodes. No significant vascular findings on noncontrast imaging identified. Other: No ascites. Musculoskeletal: No acute or significant osseous findings. IMPRESSION: 1. Motion artifact results in suboptimal evaluation of the biliary system. Cholelithiasis without evidence of cholecystitis, significant biliary dilatation or definite choledocholithiasis. 2. Hepatic steatosis. 3. Peripancreatic edema consistent with pancreatitis. No focal fluid collection. Electronically Signed   By: Richardean Sale M.D.   On: 09/04/2016 13:53   MR 3D Recon At Scanner  Result Date:  09/04/2016 CLINICAL DATA:  Right upper quadrant abdominal pain. Pancreatitis with elevated liver function studies. EXAM: MRI ABDOMEN WITHOUT CONTRAST  (INCLUDING MRCP) TECHNIQUE: Multiplanar multisequence MR imaging of the abdomen was performed. Heavily T2-weighted images of the biliary and pancreatic ducts were obtained, and three-dimensional MRCP images were rendered by post processing. COMPARISON:  Ultrasound today and 04/06/2016. FINDINGS: Despite efforts by the technologist and patient, moderate motion artifact is present on today's exam and could not be eliminated. This reduces exam sensitivity and specificity. Lower chest: The visualized lower chest appears remarkable. No significant pleural effusion. Hepatobiliary: The liver demonstrates loss of signal on the gradient echo opposed phase images consistent with steatosis. No focal lesions are identified. Multiple small gallstones are present. There is no gallbladder wall thickening or surrounding inflammation. The ERCP images are degraded by motion. No significant biliary dilatation. No definite choledocholithiasis. Pancreas: Fatty replaced with mild surrounding edema. No focal fluid collection or ductal dilatation identified. Spleen: Normal in size without focal abnormality. Adrenals/Urinary Tract: Both adrenal glands appear normal. Tiny cyst in the lower  pole of the left kidney. The right kidney appears normal. No hydronephrosis. Stomach/Bowel: No evidence of bowel wall thickening, distention or surrounding inflammatory change. Vascular/Lymphatic: Small lymph nodes in the porta hepatis, likely reactive. No enlarged retroperitoneal lymph nodes. No significant vascular findings on noncontrast imaging identified. Other: No ascites. Musculoskeletal: No acute or significant osseous findings. IMPRESSION: 1. Motion artifact results in suboptimal evaluation of the biliary system. Cholelithiasis without evidence of cholecystitis, significant biliary dilatation or definite choledocholithiasis. 2. Hepatic steatosis. 3. Peripancreatic edema consistent with pancreatitis. No focal fluid collection. Electronically Signed   By: Richardean Sale M.D.   On: 09/04/2016 13:53   US Abdomen Limited RUQ  Result Date: 09/04/2016 CLINICAL DATA:  Right upper quadrant pain EXAM: ULTRASOUND ABDOMEN LIMITED RIGHT UPPER QUADRANT COMPARISON:  Ultrasound abdomen 04/06/2016 FINDINGS: Gallbladder: Cholelithiasis. Largest calculus 13 mm. Negative for gallbladder wall thickening. Negative for sonographic Murphy sign Common bile duct: Diameter: 7 mm Liver: Increased echogenicity of the liver without focal liver lesion. IMPRESSION: Cholelithiasis without evidence of cholecystitis Common bile duct upper normal Echogenic liver compatible with fatty infiltration. Electronically Signed   By: Franchot Gallo M.D.   On: 09/04/2016 10:42    Assessment & Plan:   Problem List Items Addressed This Visit     Diabetic nephropathy associated with type 2 diabetes mellitus ([place]) - Primary    Untreated nephropathy secondary to hyperkalemia and increased CR with lisinopril trial.  RA ultrasound was ordered in may 2022 but never done for unclear reasons . Today Is not taking humalog , only basal . h   Getting CBG today.  Will increase Lantus to 25 units       Relevant Medications   insulin glargine (LANTUS  SOLOSTAR) 100 UNIT/ML Solostar Pen   Drug-induced hyperkalemia   Relevant Orders   VAS US RENAL ARTERY DUPLEX   Hyperlipidemia    Inadequately controlled due to statin myalgias.  Continue Zetia.  Not a candidate for PCSK9 inhibitor therapy due to absence of documented ASCVD      Relevant Medications   furosemide (LASIX) 20 MG tablet   Degenerative lumbar spinal stenosis    She has deferred surgery based on recommendations from previous orthopedist who treated her as a young woman with back pain.  Advised her to continue efforts to lose weight and consider PT vs ESI in the future      Statin myopathy    Intolerant of crestor and  lipitor.  Taking Zetia.        Numbness and tingling in right hand    Sensation is intact to light touch, pressure and heat/cold.  Strength is normal.  CTS presumed.  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  Referring to neurology for EMG/[place] studies       Relevant Orders   Ambulatory referral to Neurology   Other Visit Diagnoses     Urge incontinence of urine       Relevant Medications   furosemide (LASIX) 20 MG tablet   Diabetes mellitus with complication (HCC)       Relevant Medications   insulin glargine (LANTUS SOLOSTAR) 100 UNIT/ML Solostar Pen       I spent 40 minutes dedicated to the care of this patient on the date of this encounter to include pre-visit review of patient's medical history,  most recent imaging studies, Face-to-face time with the patient , and post visit ordering of testing and therapeutics.    Follow-up: No follow-ups on file.   Crecencio Mc, MD

## 2021-04-25 NOTE — Assessment & Plan Note (Signed)
Sensation is intact to light touch, pressure and heat/cold.  Strength is normal.  CTS presumed.  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  Referring to neurology for EMG/[place] studies

## 2021-04-25 NOTE — Patient Instructions (Addendum)
Increase your daily Lantus dose to 25 units   If you start to have low blood sugars because of your [place] diet,  reduce the lantus back to 22 units   Check bp once a week and send me readings in a few weeks    The ShingRx vaccine is now available in local pharmacies and is covered by Medicare .  It is much more protective than the old one  Zostavax  (it is about 97%  Effective in preventing shingles). .   It is therefore ADVISED for all interested adults over 50 to prevent shingles so I have printed you a prescription for it.  (it requires a 2nd dose 2 to 6 months after the first one) .  It will cause you to have flu  like symptoms for 2 days

## 2021-04-25 NOTE — Assessment & Plan Note (Signed)
Intolerant of crestor and lipitor.  Taking Zetia.

## 2021-04-25 NOTE — Chronic Care Management (AMB) (Signed)
Chronic Care Management CCM Pharmacy Note  04/25/2021 Name:  JANAIA KOZEL MRN:  [id number] DOB:  03/19/1941  Summary: - Educated on use of [place] today  Recommendations/Changes made from today's visit: - Continue current regimen  Subjective: HEELA HEISHMAN is an 81 y.o. year old female who is a primary patient of Derrel Nip, Aris Everts, MD.  The CCM team was consulted for assistance with disease management and care coordination needs.    Engaged with patient face to face for follow up visit for pharmacy case management and/or care coordination services.   Objective:  Medications Reviewed Today     Reviewed by [name], [place] (Certified Medical Assistant) on 04/25/21 at 1201  Med List Status: <None>   Medication Order Taking? Sig Documenting Provider Last Dose Status Informant  Accu-Chek Softclix Lancets lancets [id number] Yes Use as instructed to check blood sugar at least 3 times daily Crecencio Mc, MD Taking Active   acetaminophen (TYLENOL) 325 MG tablet [id number] Yes Take 2 tablets (650 mg total) by mouth every 6 (six) hours as needed for mild pain (or Fever >/= 101). Shepperson, Kirstin, PA-C Taking Active Self  amLODipine (NORVASC) 10 MG tablet [id number] Yes Take 1 tablet (10 mg total) by mouth daily. Crecencio Mc, MD Taking Active   amoxicillin (AMOXIL) 500 MG capsule [id number] No TAKE 4 CAPSULES BY MOUTH 1 HOUR PRIOR TO DENTAL PROCEDURE  Patient not taking: Reported on 04/25/2021   Crecencio Mc, MD Not Taking Active   Blood Glucose Monitoring Suppl (ACCU-CHEK GUIDE) w/Device KIT [id number] Yes Use to check blood sugars at least 3 times daily Crecencio Mc, MD Taking Active   celecoxib (CELEBREX) 100 MG capsule [id number] Yes Take 1 capsule (100 mg total) by mouth 2 (two) times daily. Crecencio Mc, MD Taking Active   cetirizine (ZYRTEC) 10 MG tablet [id number] Yes Take 1 tablet (10 mg total) by mouth daily. Crecencio Mc, MD Taking Active   Cholecalciferol  (VITAMIN D3) 2000 units TABS [id number] Yes Take 1 tablet by mouth daily. [provider] Taking Active   cycloSPORINE (RESTASIS) 0.05 % ophthalmic emulsion [id number] Yes Place 1 drop into both eyes 2 (two) times daily. [provider] Taking Active Self  dicyclomine (BENTYL) 20 MG tablet [id number] Yes Take 1 tablet (20 mg total) by mouth 4 (four) times daily. Before meals and at bedtime Crecencio Mc, MD Taking Active   ezetimibe (ZETIA) 10 MG tablet [id number] Yes Take 1 tablet (10 mg total) by mouth daily. Crecencio Mc, MD Taking Active   furosemide (LASIX) 20 MG tablet [id number] Yes Take 1 tablet (20 mg total) by mouth every other day. As needed for fluid retention Crecencio Mc, MD Taking Active   glucose blood (ACCU-CHEK GUIDE) test strip [id number] Yes Use as instructed Crecencio Mc, MD Taking Active   insulin glargine (LANTUS SOLOSTAR) 100 UNIT/ML Solostar Pen [id number] Yes AT THE START OF THERAPY INJECT 10 UNITS UNDER THE SKIN DAILY, TITRATE AS INSTRUCTED THEREAFTER FOR MAXIMUM DAILY DOSE OF 30 UNITS Crecencio Mc, MD Taking Active            Med Note [facility] Apr 09, 2021  1:42 PM) 22 units daily  Insulin Pen Needle (B-D UF III MINI PEN NEEDLES) 31G X 5 MM MISC [id number] Yes USE 1 NEEDLE DAILY Crecencio Mc, MD Taking Active   levothyroxine (SYNTHROID) 137 MCG tablet [id number] Yes Take 1 tablet (137 mcg total)  by mouth daily before breakfast. Crecencio Mc, MD Taking Active   LUMIGAN 0.01 % SOLN [id number] Yes Place 1-2 drops into both eyes at bedtime. Crecencio Mc, MD Taking Active   nystatin cream (MYCOSTATIN) [id number] Yes Apply topically 2 (two) times daily. Crecencio Mc, MD Taking Active   pantoprazole (PROTONIX) 40 MG tablet [id number] Yes Take 1 tablet (40 mg total) by mouth daily. Crecencio Mc, MD Taking Active   tiZANidine (ZANAFLEX) 4 MG tablet [id number] Yes Take 1 tablet (4 mg total) by mouth every 6 (six) hours as needed for  muscle spasms. Crecencio Mc, MD Taking Active   tolterodine (DETROL LA) 4 MG 24 hr capsule [id number] Yes Take 1 capsule (4 mg total) by mouth 2 (two) times daily. Crecencio Mc, MD Taking Active             Pertinent Labs:   Lab Results  Component Value Date   HGBA1C 8.6 (H) 04/20/2021   Lab Results  Component Value Date   CHOL 242 (H) 04/20/2021   HDL 52.40 04/20/2021   LDLCALC 158 (H) 04/20/2021   LDLDIRECT 162.0 08/16/2020   TRIG 154.0 (H) 04/20/2021   CHOLHDL 5 04/20/2021   Lab Results  Component Value Date   CREATININE 0.86 04/20/2021   BUN 19 04/20/2021   NA 137 04/20/2021   K 4.8 04/20/2021   CL 101 04/20/2021   CO2 25 04/20/2021    SDOH:  (Social Determinants of Health) assessments and interventions performed:  SDOH Interventions    Flowsheet Row Most Recent Value  SDOH Interventions   Financial Strain Interventions Intervention Not Indicated       CCM Care Plan  Review of patient past medical history, allergies, medications, health status, including review of consultants reports, laboratory and other test data, was performed as part of comprehensive evaluation and provision of chronic care management services.   Care Plan : Medication Management  Updates made by De Hollingshead, RPH-CPP since 04/25/2021 12:00 AM     Problem: Diabetes      Long-Range Goal: Disease Progression Prevention   Recent Progress: On track  Priority: High  Note:   Current Barriers:  Unable to achieve control of diabetes  Does not adhere to prescribed medication regimen  Pharmacist Clinical Goal(s):  Over the next 90 days, patient will improve control of diabetes as evidenced by improvement in A1c. Over the next 90 days, patient will adhere to prescribed medication regimen.  Interventions: 1:1 collaboration with Crecencio Mc, MD regarding development and update of comprehensive plan of care as evidenced by provider attestation and  co-signature Inter-disciplinary care team collaboration (see longitudinal plan of care) Comprehensive medication review performed; medication list updated in electronic medical record   Diabetes: Uncontrolled; current treatment: Lantus 22 units daily;  Hx Victoza, but was discontinued after an episode of gallstone pancreatitis (recommended by GI providers while hospitalized), will avoid GLP1 moving forward Hx metformin XR, patient d/c after diarrhea Unsure if patient would tolerate SGLT2 d/t baseline urinary incontinence  Current blood sugar readings: fasting: this morning was 164 Current meal patterns: GOLO diet; lean proteins limited to 3 oz; vegetables (~1/2-1 cup depending on meal); eggs, yogurt, brown rice, limited carbs; fruit Approved for Libre CGM coverage. Educated on use of Libre 2 today. Patient placed her first sensor. Reviewed need to scan at least Q8H, though discussed scanning several times a day to evaluate control. Turned off high glucose alarm.  Increase Lantus as directed by PCP.  Can consider trial of SGLT2 moving forward.   Hypertension: Controlled; current treatment: amlodipine 10 mg daily, furosemide 20 mg every other day Home BP readings: unknown, not checking lately  Previously recommended to continue current treatment regimen.   Hyperlipidemia: Uncontrolled; current treatment: ezetimibe 10 mg daily;  Medications previously tried: Reports hx muscle aches and GI upset with atorvastatin, rosuvastatin 5 mg daily. Refuses retrial.  Given age and intolerances, previously recommended to current regimen at this time  IBS w/ gallbladder removal: Improved; current regimen: dicyclomine 20 mg QID PRN; pantoprazole 40 mg daily Previously recommended to continue current regimen at this time.  GERD: Controlled per patient report, current treatment: pantoprazole 40 mg daily Previously recommended to continue current treatment regimen.  Could consider de-escalation to PRN  famotidine or tums instead. Will discuss moving forward.   Arthritis Pain s/p bilateral hip and knee replacements Controlled, current treatment regimen: celecoxib 100 mg BID, acetaminophen PRN, tizanidine 4 mg TID PRN (sometimes 3-4 doses daily) Reports that she confirmed with the pharmacy that they would cover tizanidine 4 mg tablet TID PRN - will follow up with pharmacy moving forward.  Previously encouraged to continue current regimen with moderation with tizanidine use   OAB/Urinary Incontience: Moderately well controlled per patient report; taking tolterodine LA 4 mg twice daily (PA approved) Previously recommended to continue current regimen at this time  Over the Counter Medications: Current regimen: cetirizine 10 mg daily for allergies, Vitamin D 1000 units daily No interactions noted. Previously recommended to continue current regimen   Patient Goals/Self-Care Activities Over the next 90 days, patient will:  - take medications as prescribed check blood glucose three times daily using CGM, document, and provide at future appointments      Plan: Face to Face appointment with care management team member scheduled for: 4 weeks  [name], PharmD, [place], [place] Pharmacist [employer] at [facility] [phone]

## 2021-04-25 NOTE — Assessment & Plan Note (Signed)
Inadequately controlled due to statin myalgias.  Continue Zetia.  Not a candidate for PCSK9 inhibitor therapy due to absence of documented ASCVD

## 2021-04-25 NOTE — Patient Instructions (Addendum)
Etta,   Scan your sugar when you wake up, at a meal time (either before or after, it does not matter) and when you go to bed. Bring your reader with you when you come to see me in a few weeks.   Ideally, we want your sugars to run between 80 and 180 at least 70-80% of time time (this is the grayed region on the reader).   Take care!  [name], PharmD  Visit Information  Following are the goals we discussed today:    Patient Goals/Self-Care Activities Over the next 90 days, patient will:  - take medications as prescribed check blood glucose three times daily using CGM, document, and provide at future appointments        Plan: Face to Face appointment with care management team member scheduled for: 4 weeks   [name], PharmD, [place], CPP Clinical Pharmacist [facility] at [facility] [phone]     Please call the care guide team at [phone] if you need to cancel or reschedule your appointment.   Print copy of patient instructions, educational materials, and care plan provided in person.

## 2021-04-25 NOTE — Telephone Encounter (Signed)
LM for labs °

## 2021-04-25 NOTE — Telephone Encounter (Signed)
Patient was notified but no CBGs available needs [place] teaching.

## 2021-04-25 NOTE — Assessment & Plan Note (Signed)
She has deferred surgery based on recommendations from previous orthopedist who treated her as a young woman with back pain.  Advised her to continue efforts to lose weight and consider PT vs ESI in the future

## 2021-04-25 NOTE — Assessment & Plan Note (Addendum)
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  RA ultrasound was ordered in may 2022 but never done for unclear reasons . Today Is not taking humalog , only basal . h   Getting CBG today.  Will increase Lantus to 25 units

## 2021-05-09 LAB — HM DIABETES EYE EXAM

## 2021-05-11 ENCOUNTER — Other Ambulatory Visit: Payer: Self-pay | Admitting: Internal Medicine

## 2021-05-16 ENCOUNTER — Other Ambulatory Visit: Payer: Self-pay

## 2021-05-16 ENCOUNTER — Ambulatory Visit (INDEPENDENT_AMBULATORY_CARE_PROVIDER_SITE_OTHER): Payer: Medicare PPO

## 2021-05-16 DIAGNOSIS — E875 Hyperkalemia: Secondary | ICD-10-CM | POA: Diagnosis not present

## 2021-05-16 DIAGNOSIS — T50905A Adverse effect of unspecified drugs, medicaments and biological substances, initial encounter: Secondary | ICD-10-CM

## 2021-05-22 DIAGNOSIS — E1121 Type 2 diabetes mellitus with diabetic nephropathy: Secondary | ICD-10-CM

## 2021-05-22 DIAGNOSIS — E782 Mixed hyperlipidemia: Secondary | ICD-10-CM

## 2021-05-24 ENCOUNTER — Ambulatory Visit: Payer: Medicare Other | Admitting: Podiatry

## 2021-05-28 ENCOUNTER — Telehealth: Payer: Self-pay | Admitting: Pharmacist

## 2021-05-28 ENCOUNTER — Other Ambulatory Visit: Payer: Self-pay | Admitting: Internal Medicine

## 2021-05-28 ENCOUNTER — Telehealth: Payer: Medicare PPO

## 2021-05-28 DIAGNOSIS — I10 Essential (primary) hypertension: Secondary | ICD-10-CM

## 2021-05-28 DIAGNOSIS — T50905A Adverse effect of unspecified drugs, medicaments and biological substances, initial encounter: Secondary | ICD-10-CM

## 2021-05-28 DIAGNOSIS — E875 Hyperkalemia: Secondary | ICD-10-CM

## 2021-05-28 NOTE — Telephone Encounter (Signed)
Patient reports for the past week (starting on Wednesday 3/1, worst on Saturday. 3/3) she has been experiencing productive cough with green mucus, aches, malaise, sore throat. Denies significant head congestion, but blew her nose several times while on the phone with me. Reports right ear being stopped up. Sore throat. Breathing is not labored while discussing with me today. Denies fever. Has been using heating pad on her chest ? ?Reports that she called the office this weekend (starting at 8 am on Saturday), but reports the phone would ring and then disconnect. Tried several times on Saturday morning.  ? ?BP: 144/71, HR 60 today ? ?Declines to go anywhere for evaluation today as she is afraid to drive.  ? ?Scheduled for telephone visit with Dr. Darrick Huntsman tomorrow at 10 am. Please make sure I scheduled that correctly, Shanda Bumps ?

## 2021-05-28 NOTE — Telephone Encounter (Signed)
Appt has been fixed.

## 2021-05-29 ENCOUNTER — Encounter: Payer: Self-pay | Admitting: Internal Medicine

## 2021-05-29 ENCOUNTER — Ambulatory Visit (INDEPENDENT_AMBULATORY_CARE_PROVIDER_SITE_OTHER): Payer: Medicare PPO | Admitting: Internal Medicine

## 2021-05-29 DIAGNOSIS — J189 Pneumonia, unspecified organism: Secondary | ICD-10-CM | POA: Diagnosis not present

## 2021-05-29 HISTORY — DX: Pneumonia, unspecified organism: J18.9

## 2021-05-29 MED ORDER — CHERATUSSIN AC 100-10 MG/5ML PO SOLN: 5.0000 mL | Freq: Three times a day (TID) | ORAL | 0 refills | Status: DC | PRN

## 2021-05-29 MED ORDER — DOXYCYCLINE HYCLATE 100 MG PO TABS: 100.0000 mg | ORAL_TABLET | Freq: Two times a day (BID) | ORAL | 0 refills | Status: DC

## 2021-05-29 MED ORDER — LEVOFLOXACIN 500 MG PO TABS: 500.0000 mg | ORAL_TABLET | Freq: Every day | ORAL | 0 refills | Status: DC

## 2021-05-29 NOTE — Progress Notes (Signed)
Telephone  Note  This visit type was conducted due to national recommendations for restrictions regarding the COVID-19 pandemic (e.g. social distancing).  This format is felt to be most appropriate for this patient at this time.  All issues noted in this document were discussed and addressed.  No physical exam was performed (except for noted visual exam findings with Video Visits).   I connected withNAME@ on 05/29/21 at 10:00 AM EST by telephone and verified that I am speaking with the correct person using two identifiers. Location patient: home Location provider: work or home office Persons participating in the virtual visit: patient, provider  I discussed the limitations, risks, security and privacy concerns of performing an evaluation and management service by telephone and the availability of in person appointments. I also discussed with the patient that there may be a patient responsible charge related to this service. The .patient expressed understanding and agreed to proceed.  Interactive audio and video telecommunications were attempted between this provider and patient, however failed, due to patient having technical difficulties OR patient did not have access to video capability.  We continued and completed visit with audio only.   Reason for visit: productive cough  HPI:  Productive cough  for the past 10 days.  States that she cCalled the office for several hours on Saturday but was unable to reach the on call service because it kept disconnecting her.  Did not go to urgent care. .   No sick contacts.  Tested twice at home for COVID ,  negative x 2.  Subjective rigors ,  feeling cold ("freezing") despite warm ambient temp of 75,    Body /bones ache.  Head aches,  eyes hurt   ears and sinuses started bothering her first ,  attributed to allergies , congestion and ears are plugged,  cough sputum has become thick and green since Saturday . Chest really bothering her,  too weak to lelave  house for chest x ray   ROS: See pertinent positives and negatives per HPI.  Past Medical History:  Diagnosis Date   Anxiety    Arthritis    Choledocholithiasis    Chronic back pain    stenosis and arthritis   Complication of anesthesia    anxious when she wakes up   GERD (gastroesophageal reflux disease)    takes Pantoprazole daily   Glaucoma    dry angle   History of bronchitis    30+ yrs ago   History of MRSA infection 2004   several yrs ago   Hyperlipidemia    takes Zetia and Crestor daily   Hypertension    take Amlodipine daily   Hypothyroidism    takes Synthroid daily   Joint pain    Joint swelling    Neuropathy    takes Gabapentin daily   Pancreatitis, gallstone    Peripheral edema    takes Furosemide daily as needed   Pneumonia    hx of-30 + yrs ago   Pre-diabetes    takes Victoza daily   Primary localized osteoarthritis of right knee    Subdural hematoma 8 yrs ago   hx of   Urinary frequency    Urinary urgency     Past Surgical History:  Procedure Laterality Date   ABDOMINAL HYSTERECTOMY     CHOLECYSTECTOMY N/A 09/09/2016   Procedure: LAPAROSCOPIC CHOLECYSTECTOMY;  Surgeon: Donnie Mesa, MD;  Location: [facility];  Service: General;  Laterality: N/A;   COLONOSCOPY     RADIOLOGY WITH ANESTHESIA  N/A 08/22/2015   Procedure: MRI OF LUMBAR SPINE   (RADIOLOGY WITH ANESTHESIA);  Surgeon: Medication Radiologist, MD;  Location: [facility];  Service: Radiology;  Laterality: N/A;   TOTAL HIP ARTHROPLASTY Right    TOTAL HIP ARTHROPLASTY Left    TOTAL KNEE ARTHROPLASTY Left    TOTAL KNEE ARTHROPLASTY Right 01/01/2016   Procedure: TOTAL KNEE ARTHROPLASTY;  Surgeon: Elsie Saas, MD;  Location: [facility];  Service: Orthopedics;  Laterality: Right;    Family History  Problem Relation Age of Onset   Cancer Father    Cancer Brother    Diabetes Maternal Aunt     SOCIAL HX:  reports that she has never smoked. She has never used smokeless tobacco. She reports that she does  not drink alcohol and does not use drugs.    Current Outpatient Medications:    Accu-Chek Softclix Lancets lancets, Use as instructed to check blood sugar at least 3 times daily, Disp: 100 each, Rfl: 12   acetaminophen (TYLENOL) 325 MG tablet, Take 2 tablets (650 mg total) by mouth every 6 (six) hours as needed for mild pain (or Fever >/= 101)., Disp: , Rfl:    amLODipine (NORVASC) 10 MG tablet, Take 1 tablet (10 mg total) by mouth daily., Disp: 90 tablet, Rfl: 3   Blood Glucose Monitoring Suppl (ACCU-CHEK GUIDE) w/Device KIT, Use to check blood sugars at least 3 times daily, Disp: 1 kit, Rfl: 1   celecoxib (CELEBREX) 100 MG capsule, Take 1 capsule (100 mg total) by mouth 2 (two) times daily., Disp: 180 capsule, Rfl: 3   cetirizine (ZYRTEC) 10 MG tablet, Take 1 tablet (10 mg total) by mouth daily., Disp: 90 tablet, Rfl: 1   Cholecalciferol (VITAMIN D3) 2000 units TABS, Take 1 tablet by mouth daily., Disp: , Rfl:    cycloSPORINE (RESTASIS) 0.05 % ophthalmic emulsion, Place 1 drop into both eyes 2 (two) times daily., Disp: , Rfl:    dicyclomine (BENTYL) 20 MG tablet, Take 1 tablet (20 mg total) by mouth 4 (four) times daily. Before meals and at bedtime, Disp: 270 tablet, Rfl: 3   doxycycline (VIBRA-TABS) 100 MG tablet, Take 1 tablet (100 mg total) by mouth 2 (two) times daily., Disp: 15 tablet, Rfl: 0   ezetimibe (ZETIA) 10 MG tablet, TAKE 1 TABLET DAILY, Disp: 90 tablet, Rfl: 3   furosemide (LASIX) 20 MG tablet, Take 1 tablet (20 mg total) by mouth daily. As needed for fluid retention, Disp: 90 tablet, Rfl: 3   glucose blood (ACCU-CHEK GUIDE) test strip, Use as instructed, Disp: 100 each, Rfl: 12   guaiFENesin-codeine (CHERATUSSIN AC) 100-10 MG/5ML syrup, Take 5 mLs by mouth 3 (three) times daily as needed for cough., Disp: 120 mL, Rfl: 0   insulin glargine (LANTUS SOLOSTAR) 100 UNIT/ML Solostar Pen, Inject 25 Units into the skin daily. Increase as directed by physician, Disp: 15 mL, Rfl: 6    Insulin Pen Needle (B-D UF III MINI PEN NEEDLES) 31G X 5 MM MISC, USE 1 NEEDLE DAILY, Disp: 90 each, Rfl: 3   levofloxacin (LEVAQUIN) 500 MG tablet, Take 1 tablet (500 mg total) by mouth daily., Disp: 7 tablet, Rfl: 0   levothyroxine (SYNTHROID) 137 MCG tablet, Take 1 tablet (137 mcg total) by mouth daily before breakfast., Disp: 90 tablet, Rfl: 3   LUMIGAN 0.01 % SOLN, Place 1-2 drops into both eyes at bedtime., Disp: 5 mL, Rfl: 0   nystatin cream (MYCOSTATIN), Apply topically 2 (two) times daily., Disp: 90 g, Rfl: 2  pantoprazole (PROTONIX) 40 MG tablet, Take 1 tablet (40 mg total) by mouth daily., Disp: 90 tablet, Rfl: 3   tiZANidine (ZANAFLEX) 4 MG tablet, Take 1 tablet (4 mg total) by mouth every 6 (six) hours as needed for muscle spasms., Disp: 270 tablet, Rfl: 0   tolterodine (DETROL LA) 4 MG 24 hr capsule, Take 1 capsule (4 mg total) by mouth 2 (two) times daily., Disp: 180 capsule, Rfl: 3   amoxicillin (AMOXIL) 500 MG capsule, TAKE 4 CAPSULES BY MOUTH 1 HOUR PRIOR TO DENTAL PROCEDURE (Patient not taking: Reported on 04/25/2021), Disp: 4 capsule, Rfl: 2  EXAM:  VITALS per patient if applicable:   General impression: alert, cooperative and articulate.  No signs of being in distress  Lungs: speech is fluent sentence length suggests that patient is not short of breath and not punctuated by cough, sneezing or sniffing. Marland Kitchen   Psych: affect normal.  speech is articulate and non pressured .  Denies suicidal thoughts   ASSESSMENT AND PLAN:  Discussed the following assessment and plan:  Community acquired pneumonia of right lung, unspecified part of lung  Community acquired pneumonia Productive cough,  Pleurisy  .  Will treat with dual antibiotics and cheratussin .  Follow up one week     I discussed the assessment and treatment plan with the patient. The patient was provided an opportunity to ask questions and all were answered. The patient agreed with the plan and demonstrated an  understanding of the instructions.   The patient was advised to call back or seek an in-person evaluation if the symptoms worsen or if the condition fails to improve as anticipated.   I spent 30 minutes dedicated to the care of this patient on the date of this encounter to include pre-visit review of his medical history,  Face-to-face time with the patient , and post visit ordering of testing and therapeutics.    Bonnie Mc, MD

## 2021-05-29 NOTE — Assessment & Plan Note (Signed)
Productive cough,  Pleurisy  .  Will treat with dual antibiotics and cheratussin .  Follow up one week  ?

## 2021-05-29 NOTE — Patient Instructions (Signed)
?  I am treating your for community acquired pneumonia with 2 antibiotics AT THE SAME TIME    .  I am prescribing the following meds: ? ? ?Levaquin:  ONE TABLET daily with food for 7 days  ?Doxycycline   1 tablet twice daily with food for 7 days  ?Cheratussin cough syrup ?I also advise use of the following OTC meds to help with your other symptoms.  ? ? ? ?Please take a probiotic ( Align, Floraque or Culturelle), the generic version of one of these over the counter medications, or Yogurt, for a minimum of 3 weeks  ANY TIME YOU ARE TREATED WITH AN ANTIBIOTIIC to prevent a serious antibiotic associated diarrhea  Called clostridium dificile colitis.  Taking a probiotic may also prevent vaginitis (if you are a woman) due to yeast infections and can be continued indefinitely if you feel that it improves your digestion or your elimination (bowels).  ? ?Take generic OTC benadryl 25 mg every 8 hours for the drainage,  Sudafed PE  10 to 30 mg every 8 hours  if needed for sinus  congestion, you may substitute Afrin nasal spray for the nighttime dose of sudafed PE  If needed to prevent insomnia. ? ?flushes your sinuses twice daily with Simply Saline (do over the sink because if you do it right you will spit out globs of mucus) ? ?You can Use  OTC  Delsym   FOR THE daytime COUGH if the cheratussin is ready  ? ?Gargle with salt water as needed for sore throat.   ?

## 2021-05-30 ENCOUNTER — Telehealth: Payer: Medicare PPO | Admitting: Internal Medicine

## 2021-05-31 ENCOUNTER — Other Ambulatory Visit: Payer: Self-pay | Admitting: Internal Medicine

## 2021-05-31 ENCOUNTER — Telehealth: Payer: Self-pay | Admitting: Pharmacist

## 2021-05-31 ENCOUNTER — Ambulatory Visit (INDEPENDENT_AMBULATORY_CARE_PROVIDER_SITE_OTHER): Payer: Medicare PPO | Admitting: Pharmacist

## 2021-05-31 DIAGNOSIS — E1121 Type 2 diabetes mellitus with diabetic nephropathy: Secondary | ICD-10-CM

## 2021-05-31 DIAGNOSIS — Z780 Asymptomatic menopausal state: Secondary | ICD-10-CM

## 2021-05-31 DIAGNOSIS — I10 Essential (primary) hypertension: Secondary | ICD-10-CM

## 2021-05-31 DIAGNOSIS — E782 Mixed hyperlipidemia: Secondary | ICD-10-CM

## 2021-05-31 DIAGNOSIS — M791 Myalgia, unspecified site: Secondary | ICD-10-CM

## 2021-05-31 NOTE — Patient Instructions (Addendum)
Ms. Lapage,  ? ?Please schedule your second Shingrix vaccine after 07/01/20. You can pursue this without a prescription at your local pharmacy, or feel free to call our [facility] at [facility] at [phone]. ? ?We recommend you get the updated bivalent COVID-19 booster, at least 2 months after any prior doses. You may consider delaying a booster dose by 3 months from a prior episode of COVID-19 per the CDC. You can also get this from our Pharmacy at [facility]. ? ?Please schedule your yearly diabetic eye exam, or have your doctor send your most recent results to our office.  ? ?I'll talk to Dr. Darrick Huntsman about the bone density exam ? ?Take care! ? ?[name], PharmD ?

## 2021-05-31 NOTE — Telephone Encounter (Signed)
Patient does not have mychart. ? ?DEXA has been ordered .  Needs to call [facility] to schedule ?

## 2021-05-31 NOTE — Chronic Care Management (AMB) (Cosign Needed)
Chronic Care Management CCM Pharmacy Note  05/31/2021 Name:  Bonnie Hinton MRN:  [id number] DOB:  1941/03/02  Summary: - Using Libre 2. Tolerating well. Ordered adhesives to keep on her arm.   Recommendations/Changes made from today's visit: - Continue current regimen at this time. Follow up with PCP as scheduled - Discussed DEXA. Patient amenable. Will collaborate w/ PCP for the order - Discussed vaccine recommendations  Subjective: Bonnie Hinton is an 81 y.o. year old female who is a primary patient of Tullo, Aris Everts, MD.  The CCM team was consulted for assistance with disease management and care coordination needs.    Engaged with patient by telephone for follow up visit for pharmacy case management and/or care coordination services.   Objective:  Medications Reviewed Today     Reviewed by [name], [place] (Certified Medical Assistant) on 05/29/21 at [phone]  Med List Status: <None>   Medication Order Taking? Sig Documenting Provider Last Dose Status Informant  Accu-Chek Softclix Lancets lancets [id number] Yes Use as instructed to check blood sugar at least 3 times daily Crecencio Mc, MD Taking Active   acetaminophen (TYLENOL) 325 MG tablet [id number] Yes Take 2 tablets (650 mg total) by mouth every 6 (six) hours as needed for mild pain (or Fever >/= 101). Shepperson, Kirstin, PA-C Taking Active Self  amLODipine (NORVASC) 10 MG tablet [id number] Yes Take 1 tablet (10 mg total) by mouth daily. Crecencio Mc, MD Taking Active   amoxicillin (AMOXIL) 500 MG capsule [id number] No TAKE 4 CAPSULES BY MOUTH 1 HOUR PRIOR TO DENTAL PROCEDURE  Patient not taking: Reported on 04/25/2021   Crecencio Mc, MD Not Taking Active   Blood Glucose Monitoring Suppl (ACCU-CHEK GUIDE) w/Device KIT [id number] Yes Use to check blood sugars at least 3 times daily Crecencio Mc, MD Taking Active   celecoxib (CELEBREX) 100 MG capsule [id number] Yes Take 1 capsule (100 mg total) by mouth 2  (two) times daily. Crecencio Mc, MD Taking Active   cetirizine (ZYRTEC) 10 MG tablet [id number] Yes Take 1 tablet (10 mg total) by mouth daily. Crecencio Mc, MD Taking Active   Cholecalciferol (VITAMIN D3) 2000 units TABS [id number] Yes Take 1 tablet by mouth daily. [provider] Taking Active   cycloSPORINE (RESTASIS) 0.05 % ophthalmic emulsion [id number] Yes Place 1 drop into both eyes 2 (two) times daily. [provider] Taking Active Self  dicyclomine (BENTYL) 20 MG tablet [id number] Yes Take 1 tablet (20 mg total) by mouth 4 (four) times daily. Before meals and at bedtime Crecencio Mc, MD Taking Active   ezetimibe (ZETIA) 10 MG tablet [id number] Yes TAKE 1 TABLET DAILY Crecencio Mc, MD Taking Active   furosemide (LASIX) 20 MG tablet [id number] Yes Take 1 tablet (20 mg total) by mouth daily. As needed for fluid retention Crecencio Mc, MD Taking Active   glucose blood (ACCU-CHEK GUIDE) test strip [id number] Yes Use as instructed Crecencio Mc, MD Taking Active   insulin glargine (LANTUS SOLOSTAR) 100 UNIT/ML Solostar Pen [id number] Yes Inject 25 Units into the skin daily. Increase as directed by physician Crecencio Mc, MD Taking Active   Insulin Pen Needle (B-D UF III MINI PEN NEEDLES) 31G X 5 MM MISC [id number] Yes USE 1 NEEDLE DAILY Crecencio Mc, MD Taking Active   levothyroxine (SYNTHROID) 137 MCG tablet [id number] Yes Take 1 tablet (137 mcg total) by mouth daily before breakfast. Crecencio Mc, MD Taking Active   [facility]  0.01 % SOLN [id number] Yes Place 1-2 drops into both eyes at bedtime. Crecencio Mc, MD Taking Active   nystatin cream (MYCOSTATIN) [id number] Yes Apply topically 2 (two) times daily. Crecencio Mc, MD Taking Active   pantoprazole (PROTONIX) 40 MG tablet [id number] Yes Take 1 tablet (40 mg total) by mouth daily. Crecencio Mc, MD Taking Active   tiZANidine (ZANAFLEX) 4 MG tablet [id number] Yes Take 1 tablet (4 mg total) by mouth every 6 (six)  hours as needed for muscle spasms. Crecencio Mc, MD Taking Active   tolterodine (DETROL LA) 4 MG 24 hr capsule [id number] Yes Take 1 capsule (4 mg total) by mouth 2 (two) times daily. Crecencio Mc, MD Taking Active             Pertinent Labs:   Lab Results  Component Value Date   HGBA1C 8.6 (H) 04/20/2021   Lab Results  Component Value Date   CHOL 242 (H) 04/20/2021   HDL 52.40 04/20/2021   LDLCALC 158 (H) 04/20/2021   LDLDIRECT 162.0 08/16/2020   TRIG 154.0 (H) 04/20/2021   CHOLHDL 5 04/20/2021   Lab Results  Component Value Date   CREATININE 0.86 04/20/2021   BUN 19 04/20/2021   NA 137 04/20/2021   K 4.8 04/20/2021   CL 101 04/20/2021   CO2 25 04/20/2021    SDOH:  (Social Determinants of Health) assessments and interventions performed:    [place]  Review of patient past medical history, allergies, medications, health status, including review of consultants reports, laboratory and other test data, was performed as part of comprehensive evaluation and provision of chronic care management services.   Care Plan : Medication Management  Updates made by De Hollingshead, RPH-CPP since 05/31/2021 12:00 AM  Completed 05/31/2021   Problem: Diabetes Resolved 05/31/2021     Long-Range Goal: Disease Progression Prevention Completed 05/31/2021  Recent Progress: On track  Priority: High  Note:   Current Barriers:  Unable to achieve control of diabetes  Does not adhere to prescribed medication regimen  Pharmacist Clinical Goal(s):  Over the next 90 days, patient will improve control of diabetes as evidenced by improvement in A1c. Over the next 90 days, patient will adhere to prescribed medication regimen.  Interventions: 1:1 collaboration with Crecencio Mc, MD regarding development and update of comprehensive plan of care as evidenced by provider attestation and co-signature Inter-disciplinary care team collaboration (see longitudinal plan of  care) Comprehensive medication review performed; medication list updated in electronic medical record   Diabetes: Uncontrolled; current treatment: Lantus 25 units daily;  Hx Victoza, but was discontinued after an episode of gallstone pancreatitis (recommended by GI providers while hospitalized), will avoid GLP1 moving forward Hx metformin XR, patient d/c after diarrhea Unsure if patient would tolerate SGLT2 d/t baseline urinary incontinence  Does report that she has not been eating "the way she should". Before being sick, has been trying to eat more vegetables. Egg for breakfast.  Current blood sugar readings: using Libre 2 Date of Download: last 7 days % Time CGM is active: 91% Average Glucose: 124 mg/dL - midnight - 6 am: 136 - 6 am - noon: 113 - noon - 6 pm: 112 - 6 pm - midnight: 132  Time in Goal:  - Time in range 70-180: 99% - Time above range: 1% - Time below range: 0% One episode of glucose going low, reports reading was 71. Discussed appropriate treatment of lows.  Discussed Skin Grips through [employer].  Patient reports she had her daughter [place]  Praised for improvement in glucose readings. Continue current regimen. Can consider trial of SGLT2 moving forward.   Hypertension: Controlled; current treatment: amlodipine 10 mg daily, furosemide 20 mg every other day Home BP readings: unknown, not checking lately  Previously recommended to continue current treatment regimen.   Hyperlipidemia: Uncontrolled; current treatment: ezetimibe 10 mg daily;  Medications previously tried: Reports hx muscle aches and GI upset with atorvastatin, rosuvastatin 5 mg daily. Refuses retrial.  Given age and intolerances, previously recommended to current regimen at this time  IBS w/ gallbladder removal: Improved; current regimen: dicyclomine 20 mg QID PRN; pantoprazole 40 mg daily Previously recommended to continue current regimen at this time.  Arthritis Pain s/p bilateral hip and  knee replacements Controlled, current treatment regimen: celecoxib 100 mg BID, acetaminophen PRN, tizanidine 4 mg TID PRN (sometimes 3-4 doses daily) Previously encouraged to continue current regimen with moderation with tizanidine use   OAB/Urinary Incontience: Moderately well controlled per patient report; taking tolterodine LA 4 mg twice daily (PA approved) Previously recommended to continue current regimen at this time  Over the Counter Medications: Current regimen: cetirizine 10 mg daily for allergies, Vitamin D 1000 units daily No interactions noted. Previously recommended to continue current regimen   Patient Goals/Self-Care Activities Over the next 90 days, patient will:  - take medications as prescribed check blood glucose three times daily using CGM, document, and provide at future appointments      Plan: Follow up with PCP as scheduled  [name], PharmD, [place], [place] Pharmacist [employer] at [facility] [phone]

## 2021-05-31 NOTE — Telephone Encounter (Signed)
Discussed DEXA. Patient would be interested in having this done. Routing to PCP to place the order.  ?

## 2021-05-31 NOTE — Addendum Note (Signed)
Addended by: Sherlene Shams on: 05/31/2021 12:55 PM ? ? Modules accepted: Orders ? ?

## 2021-06-01 NOTE — Telephone Encounter (Signed)
Spoke with pt to let her know that dexa scan has been ordered and gave her the number to call to schedule.  ?

## 2021-06-04 ENCOUNTER — Ambulatory Visit: Payer: Medicare Other | Admitting: Podiatry

## 2021-06-04 ENCOUNTER — Other Ambulatory Visit: Payer: Self-pay | Admitting: Internal Medicine

## 2021-06-04 DIAGNOSIS — N3941 Urge incontinence: Secondary | ICD-10-CM

## 2021-06-05 ENCOUNTER — Other Ambulatory Visit: Payer: Self-pay | Admitting: Internal Medicine

## 2021-06-05 ENCOUNTER — Ambulatory Visit: Payer: Medicare PPO

## 2021-06-05 DIAGNOSIS — Z1231 Encounter for screening mammogram for malignant neoplasm of breast: Secondary | ICD-10-CM

## 2021-06-07 ENCOUNTER — Other Ambulatory Visit: Payer: Self-pay | Admitting: Internal Medicine

## 2021-06-18 ENCOUNTER — Telehealth: Payer: Medicare Other

## 2021-06-22 DIAGNOSIS — E782 Mixed hyperlipidemia: Secondary | ICD-10-CM | POA: Diagnosis not present

## 2021-06-22 DIAGNOSIS — I10 Essential (primary) hypertension: Secondary | ICD-10-CM

## 2021-06-22 DIAGNOSIS — E1121 Type 2 diabetes mellitus with diabetic nephropathy: Secondary | ICD-10-CM | POA: Diagnosis not present

## 2021-07-06 ENCOUNTER — Telehealth: Payer: Self-pay

## 2021-07-06 NOTE — Chronic Care Management (AMB) (Signed)
?  Chronic Care Management  ? ?Note ? ?07/06/2021 ?Name: Bonnie Hinton MRN: [id number] DOB: 1940/10/11 ? ?[name] is a 81 y.o. year old female who is a primary care patient of Darrick Huntsman, Mar Daring, MD. JAYDY FITZHENRY is currently enrolled in care management services. An additional referral for RNCM was placed.  ? ?Follow up plan: ?Unsuccessful telephone outreach attempt made. A HIPAA compliant phone message was left for the patient providing contact information and requesting a return call.  ?The care management team will reach out to the patient again over the next 5 days.  ?If patient returns call to provider office, please advise to call Embedded Care Management Care Guide Penne Lash  at [phone] ? ?Penne Lash, RMA ?Care Guide, Embedded Care Coordination ?  Care Management  ?[place], [place] [address] ?Direct Dial: [phone] ?[email or web address] ?[email or web address]: [email or web address]  ? ?

## 2021-07-06 NOTE — Chronic Care Management (AMB) (Signed)
?  Chronic Care Management  ? ?Note ? ?07/06/2021 ?Name: Bonnie Hinton MRN: [id number] DOB: 05/11/1940 ? ?Bonnie Hinton is a 81 y.o. year old female who is a primary care patient of Darrick Huntsman, Mar Daring, MD. Bonnie Hinton is currently enrolled in care management services. An additional referral for RNCM was placed.  ? ?Follow up plan: ?Telephone appointment with care management team member scheduled for:08/06/2021 ? ?Penne Lash, RMA ?Care Guide, Embedded Care Coordination ?[facility]  Care Management  ?[place], [place] [address] ?Direct Dial: [phone] ?[email or web address] ?Website: [email or web address]   ?

## 2021-07-12 ENCOUNTER — Ambulatory Visit: Payer: Medicare PPO

## 2021-07-17 DIAGNOSIS — E1165 Type 2 diabetes mellitus with hyperglycemia: Secondary | ICD-10-CM | POA: Diagnosis not present

## 2021-07-20 ENCOUNTER — Other Ambulatory Visit (INDEPENDENT_AMBULATORY_CARE_PROVIDER_SITE_OTHER): Payer: Medicare PPO

## 2021-07-20 DIAGNOSIS — E1121 Type 2 diabetes mellitus with diabetic nephropathy: Secondary | ICD-10-CM

## 2021-07-20 DIAGNOSIS — I10 Essential (primary) hypertension: Secondary | ICD-10-CM | POA: Diagnosis not present

## 2021-07-20 DIAGNOSIS — E782 Mixed hyperlipidemia: Secondary | ICD-10-CM | POA: Diagnosis not present

## 2021-07-20 LAB — COMPREHENSIVE METABOLIC PANEL
ALT: 9 U/L (ref 0–35)
AST: 16 U/L (ref 0–37)
Albumin: 4.1 g/dL (ref 3.5–5.2)
Alkaline Phosphatase: 64 U/L (ref 39–117)
BUN: 14 mg/dL (ref 6–23)
CO2: 24 mEq/L (ref 19–32)
Calcium: 9.4 mg/dL (ref 8.4–10.5)
Chloride: 103 mEq/L (ref 96–112)
Creatinine, Ser: 0.9 mg/dL (ref 0.40–1.20)
GFR: 60.37 mL/min (ref >60.00)
Glucose, Bld: 129 mg/dL — ABNORMAL HIGH (ref 70–99)
Potassium: 4.3 mEq/L (ref 3.5–5.1)
Sodium: 138 mEq/L (ref 135–145)
Total Bilirubin: 0.4 mg/dL (ref 0.2–1.2)
Total Protein: 7.1 g/dL (ref 6.0–8.3)

## 2021-07-20 LAB — LIPID PANEL
Cholesterol: 233 mg/dL — ABNORMAL HIGH (ref 0–200)
HDL: 41.4 mg/dL (ref >39.00)
NonHDL: 191.37
Total CHOL/HDL Ratio: 6
Triglycerides: 266 mg/dL — ABNORMAL HIGH (ref 0.0–149.0)
VLDL: 53.2 mg/dL — ABNORMAL HIGH (ref 0.0–40.0)

## 2021-07-20 LAB — LDL CHOLESTEROL, DIRECT: Direct LDL: 154 mg/dL

## 2021-07-20 LAB — HEMOGLOBIN A1C: Hgb A1c MFr Bld: 6.9 % — ABNORMAL HIGH (ref 4.6–6.5)

## 2021-07-23 ENCOUNTER — Encounter: Payer: Self-pay | Admitting: Internal Medicine

## 2021-07-23 ENCOUNTER — Ambulatory Visit: Payer: Medicare PPO | Admitting: Internal Medicine

## 2021-07-23 VITALS — BP 140/74 | HR 84 | Temp 97.9°F | Ht 65.0 in | Wt 299.0 lb

## 2021-07-23 DIAGNOSIS — E1121 Type 2 diabetes mellitus with diabetic nephropathy: Secondary | ICD-10-CM

## 2021-07-23 DIAGNOSIS — Z9989 Dependence on other enabling machines and devices: Secondary | ICD-10-CM

## 2021-07-23 DIAGNOSIS — E039 Hypothyroidism, unspecified: Secondary | ICD-10-CM

## 2021-07-23 DIAGNOSIS — I10 Essential (primary) hypertension: Secondary | ICD-10-CM | POA: Diagnosis not present

## 2021-07-23 DIAGNOSIS — E782 Mixed hyperlipidemia: Secondary | ICD-10-CM | POA: Diagnosis not present

## 2021-07-23 DIAGNOSIS — J189 Pneumonia, unspecified organism: Secondary | ICD-10-CM | POA: Diagnosis not present

## 2021-07-23 DIAGNOSIS — E441 Mild protein-calorie malnutrition: Secondary | ICD-10-CM

## 2021-07-23 DIAGNOSIS — Z993 Dependence on wheelchair: Secondary | ICD-10-CM | POA: Insufficient documentation

## 2021-07-23 MED ORDER — SPIRONOLACTONE 50 MG PO TABS: 50.0000 mg | ORAL_TABLET | Freq: Every day | ORAL | 5 refills | Status: DC

## 2021-07-23 NOTE — Assessment & Plan Note (Addendum)
Since her knee replacement in 2017 ?

## 2021-07-23 NOTE — Patient Instructions (Addendum)
I am substituting  a different diuretic called spironolactone for furosemide for your fluid pill to help with your hair loss ? ?You can add back the furosemide if you need to ,  to control your fluid retention  ? ?Minoxidil is not covered by your insurance  ? ?Your diabetes is now under excellent control and your cholesterol is improving too ? ?Continue zetia.  Continue lantus  ? ? ?

## 2021-07-23 NOTE — Progress Notes (Signed)
? ?Subjective:  ?Patient ID: Bonnie Hinton, female    DOB: 28-Jul-1940  Age: 81 y.o. MRN: [id number] ? ?CC: The primary encounter diagnosis was Essential hypertension, benign. Diagnoses of Acquired hypothyroidism, Mixed hyperlipidemia, Diabetic nephropathy associated with type 2 diabetes mellitus ([place]), Walker as ambulation aid, Morbid obesity ([place]), Mild protein-calorie malnutrition ([place]), and Community acquired pneumonia of right lung, unspecified part of lung were also pertinent to this visit. ? ? ?This visit occurred during the SARS-CoV-2 public health emergency.  Safety protocols were in place, including screening questions prior to the visit, additional usage of staff PPE, and extensive cleaning of exam room while observing appropriate contact time as indicated for disinfecting solutions.   ? ?HPI ?Bonnie Hinton presents for follow up on diabetes , obesity  ?Chief Complaint  ?Patient presents with  ? Follow-up  ?  3 month follow up on diabetes  ? ?1) T2DM:  She  feels generally well,  But is not  exercising regularly due to chronic orthopedic issues. Wants to lose weight  but gained during a recent illness.   Uses a walker.     Doing the [place] diet.  Has lost 15 lbs.  Checking  blood sugars less than once daily at variable times, usually only if she feels she may be having a hypoglycemic event. .  BS have been under 130 fasting and < 150 post prandially.  Denies any recent hypoglyemic events.  Taking  asa , Lantus and Zetia  as directed.  Intolerant of statin but taking Zetia.   Following a carbohydrate modified diet 6 days per week. Denies numbness, burning and tingling of extremities. Appetite is already smal and she is easily nauseated.  Ozempic discussed but deferred ? ? ?Staying busy started her raised bed garden.  Peppers and tomatoes .  Likes to can and freeze.  ? ?. ? ? ?Outpatient Medications Prior to Visit  ?Medication Sig Dispense Refill  ? Accu-Chek Softclix Lancets lancets Use as  instructed to check blood sugar at least 3 times daily 100 each 12  ? acetaminophen (TYLENOL) 325 MG tablet Take 2 tablets (650 mg total) by mouth every 6 (six) hours as needed for mild pain (or Fever >/= 101).    ? amLODipine (NORVASC) 10 MG tablet Take 1 tablet (10 mg total) by mouth daily. 90 tablet 3  ? Blood Glucose Monitoring Suppl (ACCU-CHEK GUIDE) w/Device KIT Use to check blood sugars at least 3 times daily 1 kit 1  ? celecoxib (CELEBREX) 100 MG capsule Take 1 capsule (100 mg total) by mouth 2 (two) times daily. 180 capsule 3  ? cetirizine (ZYRTEC) 10 MG tablet Take 1 tablet (10 mg total) by mouth daily. 90 tablet 1  ? Cholecalciferol (VITAMIN D3) 2000 units TABS Take 1 tablet by mouth daily.    ? Continuous Blood Gluc Sensor (FREESTYLE LIBRE 2 SENSOR) MISC Apply 1 each topically every 14 (fourteen) days.    ? cycloSPORINE (RESTASIS) 0.05 % ophthalmic emulsion Place 1 drop into both eyes 2 (two) times daily.    ? dicyclomine (BENTYL) 20 MG tablet Take 1 tablet (20 mg total) by mouth 4 (four) times daily. Before meals and at bedtime 270 tablet 3  ? ezetimibe (ZETIA) 10 MG tablet TAKE 1 TABLET DAILY 90 tablet 3  ? furosemide (LASIX) 20 MG tablet TAKE 1 TABLET DAILY 90 tablet 3  ? glucose blood (ACCU-CHEK GUIDE) test strip Use as instructed 100 each 12  ? insulin glargine (LANTUS SOLOSTAR) 100 UNIT/ML  Solostar Pen Inject 25 Units into the skin daily. Increase as directed by physician 15 mL 6  ? Insulin Pen Needle (B-D UF III MINI PEN NEEDLES) 31G X 5 MM MISC USE 1 NEEDLE DAILY 90 each 3  ? levothyroxine (SYNTHROID) 137 MCG tablet TAKE 1 TABLET DAILY BEFORE BREAKFAST 90 tablet 1  ? LUMIGAN 0.01 % SOLN Place 1-2 drops into both eyes at bedtime. 5 mL 0  ? nystatin cream (MYCOSTATIN) Apply topically 2 (two) times daily. 90 g 2  ? pantoprazole (PROTONIX) 40 MG tablet Take 1 tablet (40 mg total) by mouth daily. 90 tablet 3  ? tiZANidine (ZANAFLEX) 4 MG tablet TAKE 1 TABLET EVERY 6 HOURS AS NEEDED FOR MUSCLE SPASM(S)  270 tablet 0  ? tolterodine (DETROL LA) 4 MG 24 hr capsule Take 1 capsule (4 mg total) by mouth 2 (two) times daily. 180 capsule 3  ? amoxicillin (AMOXIL) 500 MG capsule TAKE 4 CAPSULES BY MOUTH 1 HOUR PRIOR TO DENTAL PROCEDURE (Patient not taking: Reported on 04/25/2021) 4 capsule 2  ? doxycycline (VIBRA-TABS) 100 MG tablet Take 1 tablet (100 mg total) by mouth 2 (two) times daily. (Patient not taking: Reported on 07/23/2021) 15 tablet 0  ? guaiFENesin-codeine (CHERATUSSIN AC) 100-10 MG/5ML syrup Take 5 mLs by mouth 3 (three) times daily as needed for cough. (Patient not taking: Reported on 07/23/2021) 120 mL 0  ? levofloxacin (LEVAQUIN) 500 MG tablet Take 1 tablet (500 mg total) by mouth daily. 7 tablet 0  ? ?No facility-administered medications prior to visit.  ? ? ?Review of Systems; ? ?Patient denies headache, fevers, malaise, unintentional weight loss, skin rash, eye pain, sinus congestion and sinus pain, sore throat, dysphagia,  hemoptysis , cough, dyspnea, wheezing, chest pain, palpitations, orthopnea, edema, abdominal pain, nausea, melena, diarrhea, constipation, flank pain, dysuria, hematuria, urinary  Frequency, nocturia, numbness, tingling, seizures,  Focal weakness, Loss of consciousness,  Tremor, insomnia, depression, anxiety, and suicidal ideation.   ? ? ? ?Objective:  ?BP 140/74 (BP Location: Left Arm, Patient Position: Sitting, Cuff Size: Large)   Pulse 84   Temp 97.9 ?F (36.6 ?C) (Oral)   Ht $R'5\' 5"'PU$  (1.651 m)   Wt 299 lb (135.6 kg)   SpO2 97%   BMI 49.76 kg/m?  ? ?BP Readings from Last 3 Encounters:  ?07/23/21 140/74  ?05/29/21 140/70  ?04/25/21 140/88  ? ? ?Wt Readings from Last 3 Encounters:  ?07/23/21 299 lb (135.6 kg)  ?05/29/21 289 lb (131.1 kg)  ?04/25/21 289 lb (131.1 kg)  ? ? ?General appearance: alert, cooperative and appears stated age ?Ears: normal TM's and external ear canals both ears ?Throat: lips, mucosa, and tongue normal; teeth and gums normal ?Neck: no adenopathy, no carotid bruit,  supple, symmetrical, trachea midline and thyroid not enlarged, symmetric, no tenderness/mass/nodules ?Back: symmetric, no curvature. ROM normal. No CVA tenderness. ?Lungs: clear to auscultation bilaterally ?Heart: regular rate and rhythm, S1, S2 normal, no murmur, click, rub or gallop ?Abdomen: soft, non-tender; bowel sounds normal; no masses,  no organomegaly ?Pulses: 2+ and symmetric ?Skin: Skin color, texture, turgor normal. No rashes or lesions ?Lymph nodes: Cervical, supraclavicular, and axillary nodes normal. ? ?Lab Results  ?Component Value Date  ? HGBA1C 6.9 (H) 07/20/2021  ? HGBA1C 8.6 (H) 04/20/2021  ? HGBA1C 8.4 (H) 08/16/2020  ? ? ?Lab Results  ?Component Value Date  ? CREATININE 0.90 07/20/2021  ? CREATININE 0.86 04/20/2021  ? CREATININE 0.84 08/16/2020  ? ? ?Lab Results  ?Component Value Date  ?  WBC 8.4 06/10/2018  ? HGB 14.9 06/10/2018  ? HCT 45.1 06/10/2018  ? PLT 306.0 06/10/2018  ? GLUCOSE 129 (H) 07/20/2021  ? CHOL 233 (H) 07/20/2021  ? TRIG 266.0 (H) 07/20/2021  ? HDL 41.40 07/20/2021  ? LDLDIRECT 154.0 07/20/2021  ? LDLCALC 158 (H) 04/20/2021  ? ALT 9 07/20/2021  ? AST 16 07/20/2021  ? NA 138 07/20/2021  ? K 4.3 07/20/2021  ? CL 103 07/20/2021  ? CREATININE 0.90 07/20/2021  ? BUN 14 07/20/2021  ? CO2 24 07/20/2021  ? TSH 3.44 04/20/2021  ? INR 1.37 09/04/2016  ? HGBA1C 6.9 (H) 07/20/2021  ? MICROALBUR 14.6 (H) 04/20/2021  ? ? ?DG Chest 2 View ? ?Result Date: 09/04/2016 ?CLINICAL DATA:  Preop chest exam, bronchitis. EXAM: CHEST  2 VIEW COMPARISON:  None. FINDINGS: The heart size and mediastinal contours are within normal limits. Low lung volumes with mild increase in interstitial prominence suspicious for chronic bronchitic change. No pneumonic consolidation, effusion or pneumothorax. Mild degenerative change along the dorsal spine. IMPRESSION: Mild interstitial prominence bilaterally which may reflect chronic bronchitic change. No pulmonary consolidations to suggest pneumonia. Electronically  Signed   By: Ashley Royalty M.D.   On: 09/04/2016 13:51  ? ?MR ABDOMEN MRCP WO CONTRAST ? ?Result Date: 09/04/2016 ?CLINICAL DATA:  Right upper quadrant abdominal pain. Pancreatitis with elevated liver function studies.

## 2021-07-23 NOTE — Assessment & Plan Note (Signed)
Resolved with dual antibiotic therapy  ?

## 2021-07-23 NOTE — Assessment & Plan Note (Addendum)
Currently well-controlled on current medications .  hemoglobin A1c is at goal of less than 7.0 . Patient is reminded to schedule an annual eye exam and foot exam is normal today. Patient has no microalbuminuria. Patient is intolerant of statin therapy for CAD risk reduction and has a history of hyperkalemia on lisinopril.  ? ?Lab Results  ?Component Value Date  ? HGBA1C 6.9 (H) 07/20/2021  ? ?Lab Results  ?Component Value Date  ? MICROALBUR 14.6 (H) 04/20/2021  ? MICROALBUR 15.6 (H) 12/31/2019  ? ? ? ?Lab Results  ?Component Value Date  ? CHOL 233 (H) 07/20/2021  ? HDL 41.40 07/20/2021  ? LDLCALC 158 (H) 04/20/2021  ? LDLDIRECT 154.0 07/20/2021  ? TRIG 266.0 (H) 07/20/2021  ? CHOLHDL 6 07/20/2021  ? ?

## 2021-07-23 NOTE — Assessment & Plan Note (Signed)
She has been losing weight using the [place] , and her diabetes is under control.  ?

## 2021-07-24 ENCOUNTER — Ambulatory Visit
Admission: RE | Admit: 2021-07-24 | Discharge: 2021-07-24 | Disposition: A | Discharge status: Home / Self Care | Payer: Medicare PPO | Source: Ambulatory Visit | Attending: Internal Medicine | Admitting: Internal Medicine

## 2021-07-24 ENCOUNTER — Other Ambulatory Visit: Payer: Self-pay | Admitting: Internal Medicine

## 2021-07-24 DIAGNOSIS — N63 Unspecified lump in unspecified breast: Secondary | ICD-10-CM

## 2021-07-24 DIAGNOSIS — R928 Other abnormal and inconclusive findings on diagnostic imaging of breast: Secondary | ICD-10-CM

## 2021-07-24 DIAGNOSIS — Z780 Asymptomatic menopausal state: Secondary | ICD-10-CM | POA: Insufficient documentation

## 2021-07-24 DIAGNOSIS — Z1231 Encounter for screening mammogram for malignant neoplasm of breast: Secondary | ICD-10-CM

## 2021-07-27 ENCOUNTER — Telehealth: Payer: Self-pay | Admitting: Internal Medicine

## 2021-07-27 NOTE — Telephone Encounter (Signed)
Pt called stating provider has to sign papers in order for pt to make an appt to go back to the mammogram office ?

## 2021-07-30 NOTE — Telephone Encounter (Signed)
Additional imaging orders need cosigning.  ?

## 2021-08-06 ENCOUNTER — Ambulatory Visit: Payer: Medicare PPO | Admitting: *Deleted

## 2021-08-06 DIAGNOSIS — I10 Essential (primary) hypertension: Secondary | ICD-10-CM

## 2021-08-06 DIAGNOSIS — E118 Type 2 diabetes mellitus with unspecified complications: Secondary | ICD-10-CM

## 2021-08-06 NOTE — Chronic Care Management (AMB) (Signed)
?  Care Management  ? ?Follow Up Note ? ? ?08/06/2021 ?Name: Bonnie Hinton MRN: [id number] DOB: Nov 20, 1942e  ? ? ?Referred by: Crecencio Mc, MD ?Reason for referral : Chronic Care Management (INITIAL) ? ? ?Successful telephone outreach to patient.  RNCM introduced self and role.  Patient agrees to [place] but would like to wait on initial assessment.  States she just received +mamogram and awaiting more testing; would like to get through the testing and results first. ? ?Follow Up Plan: The care management team will reach out to the patient again over the next 30 days.  ? ?Hubert Azure RN, MSN ?RN Care Management Coordinator ?[place] ?[phone] ?[email or web address] ? ? ?

## 2021-08-08 ENCOUNTER — Other Ambulatory Visit: Payer: Medicare PPO

## 2021-08-08 ENCOUNTER — Ambulatory Visit
Admission: RE | Admit: 2021-08-08 | Discharge: 2021-08-08 | Disposition: A | Discharge status: Home / Self Care | Payer: Medicare PPO | Source: Ambulatory Visit | Attending: Internal Medicine | Admitting: Internal Medicine

## 2021-08-08 DIAGNOSIS — N63 Unspecified lump in unspecified breast: Secondary | ICD-10-CM | POA: Diagnosis not present

## 2021-08-08 DIAGNOSIS — N6320 Unspecified lump in the left breast, unspecified quadrant: Secondary | ICD-10-CM

## 2021-08-08 DIAGNOSIS — R928 Other abnormal and inconclusive findings on diagnostic imaging of breast: Secondary | ICD-10-CM | POA: Diagnosis not present

## 2021-08-08 DIAGNOSIS — N6321 Unspecified lump in the left breast, upper outer quadrant: Secondary | ICD-10-CM | POA: Diagnosis not present

## 2021-08-08 NOTE — Assessment & Plan Note (Signed)
Diagnostic u/s and mammogram inconclusive suggestive of LN's.  6 month follow up recommended  ?

## 2021-08-17 ENCOUNTER — Other Ambulatory Visit: Payer: Medicare PPO

## 2021-08-29 ENCOUNTER — Ambulatory Visit: Payer: Medicare PPO | Admitting: *Deleted

## 2021-08-29 DIAGNOSIS — E118 Type 2 diabetes mellitus with unspecified complications: Secondary | ICD-10-CM

## 2021-08-29 DIAGNOSIS — I10 Essential (primary) hypertension: Secondary | ICD-10-CM

## 2021-08-29 NOTE — Chronic Care Management (AMB) (Signed)
  Care Management   Outreach Note  08/29/2021 Name: Bonnie Hinton MRN: [id number] DOB: [birth year]  Referred by: Sherlene Shams, MD Reason for referral : Care Coordination (INITIAL)   Successful  telephone outreach to patient for initial assessment.  Patient states she did not go to sleep until 4 am this morning and is still pretty tired.  Requesting call back another day and time  Follow Up Plan:  The care management team will reach out to the patient again over the next 30 days.  Per patient request.  Rhae Lerner RN, MSN RN Care Management Coordinator [facility] [phone] [email or web address]

## 2021-08-30 ENCOUNTER — Telehealth: Payer: Self-pay | Admitting: Internal Medicine

## 2021-08-30 DIAGNOSIS — N3941 Urge incontinence: Secondary | ICD-10-CM

## 2021-08-30 MED ORDER — FUROSEMIDE 20 MG PO TABS: 20.0000 mg | ORAL_TABLET | Freq: Every day | ORAL | 3 refills | Status: DC

## 2021-08-30 NOTE — Telephone Encounter (Signed)
Medication has been refilled.

## 2021-08-30 NOTE — Telephone Encounter (Signed)
Patient called and said [facility] would like office to call them at [phone], for a refill on her furosemide (LASIX) 20 MG tablet, asking for a prescription.

## 2021-09-03 ENCOUNTER — Other Ambulatory Visit: Payer: Self-pay | Admitting: Internal Medicine

## 2021-09-03 DIAGNOSIS — E118 Type 2 diabetes mellitus with unspecified complications: Secondary | ICD-10-CM

## 2021-09-12 ENCOUNTER — Ambulatory Visit (INDEPENDENT_AMBULATORY_CARE_PROVIDER_SITE_OTHER): Payer: Medicare PPO | Admitting: *Deleted

## 2021-09-12 DIAGNOSIS — I10 Essential (primary) hypertension: Secondary | ICD-10-CM

## 2021-09-12 DIAGNOSIS — E118 Type 2 diabetes mellitus with unspecified complications: Secondary | ICD-10-CM

## 2021-09-12 NOTE — Patient Instructions (Signed)
Visit Information ? ?Thank you for allowing me to share the care management and care coordination services that are available to you as part of your health plan and services through your primary care provider and medical home. Please reach out to me at [phone]   if the care management/care coordination team may be of assistance to you in the future.  ? ?Cambren Helm RN, MSN ?RN Care Management Coordinator ?[facility] ?[phone] ?[email or web address] ? ?

## 2021-09-12 NOTE — Chronic Care Management (AMB) (Cosign Needed)
  Care Management   Follow Up Note   09/12/2021 Name: Bonnie Hinton MRN: [id number] DOB: [birth year]   Referred by: Crecencio Mc, MD Reason for referral : Case Closure   Successful telephone outreach  to patient.  Discussed with patient goals met with Hgb A1C at goal below 7.  Denies any issues with affording medications.  Closing Case at this tune as goals are met.  Follow Up Plan: No further follow up required: closing case as goals has been met,  Hubert Azure RN, MSN RN Care Management Coordinator [place] [phone] [email or web address]

## 2021-09-21 DIAGNOSIS — E118 Type 2 diabetes mellitus with unspecified complications: Secondary | ICD-10-CM

## 2021-09-21 DIAGNOSIS — I10 Essential (primary) hypertension: Secondary | ICD-10-CM

## 2021-09-27 ENCOUNTER — Other Ambulatory Visit: Payer: Self-pay | Admitting: Internal Medicine

## 2021-10-05 DIAGNOSIS — E1165 Type 2 diabetes mellitus with hyperglycemia: Secondary | ICD-10-CM | POA: Diagnosis not present

## 2021-10-23 ENCOUNTER — Other Ambulatory Visit (INDEPENDENT_AMBULATORY_CARE_PROVIDER_SITE_OTHER): Payer: Medicare PPO

## 2021-10-23 DIAGNOSIS — E782 Mixed hyperlipidemia: Secondary | ICD-10-CM | POA: Diagnosis not present

## 2021-10-23 DIAGNOSIS — E039 Hypothyroidism, unspecified: Secondary | ICD-10-CM | POA: Diagnosis not present

## 2021-10-23 DIAGNOSIS — I10 Essential (primary) hypertension: Secondary | ICD-10-CM

## 2021-10-23 DIAGNOSIS — E1121 Type 2 diabetes mellitus with diabetic nephropathy: Secondary | ICD-10-CM | POA: Diagnosis not present

## 2021-10-23 LAB — COMPREHENSIVE METABOLIC PANEL
ALT: 9 U/L (ref 0–35)
AST: 15 U/L (ref 0–37)
Albumin: 4.4 g/dL (ref 3.5–5.2)
Alkaline Phosphatase: 78 U/L (ref 39–117)
BUN: 22 mg/dL (ref 6–23)
CO2: 23 mEq/L (ref 19–32)
Calcium: 9.6 mg/dL (ref 8.4–10.5)
Chloride: 101 mEq/L (ref 96–112)
Creatinine, Ser: 0.93 mg/dL (ref 0.40–1.20)
GFR: 57.94 mL/min — ABNORMAL LOW (ref >60.00)
Glucose, Bld: 130 mg/dL — ABNORMAL HIGH (ref 70–99)
Potassium: 4.7 mEq/L (ref 3.5–5.1)
Sodium: 137 mEq/L (ref 135–145)
Total Bilirubin: 0.5 mg/dL (ref 0.2–1.2)
Total Protein: 7.6 g/dL (ref 6.0–8.3)

## 2021-10-23 LAB — LIPID PANEL
Cholesterol: 236 mg/dL — ABNORMAL HIGH (ref 0–200)
HDL: 48.6 mg/dL (ref >39.00)
LDL Cholesterol: 155 mg/dL — ABNORMAL HIGH (ref 0–99)
NonHDL: 187.75
Total CHOL/HDL Ratio: 5
Triglycerides: 166 mg/dL — ABNORMAL HIGH (ref 0.0–149.0)
VLDL: 33.2 mg/dL (ref 0.0–40.0)

## 2021-10-23 LAB — LDL CHOLESTEROL, DIRECT: Direct LDL: 159 mg/dL

## 2021-10-23 LAB — TSH: TSH: 0.74 u[IU]/mL (ref 0.35–5.50)

## 2021-10-23 LAB — HEMOGLOBIN A1C: Hgb A1c MFr Bld: 7.2 % — ABNORMAL HIGH (ref 4.6–6.5)

## 2021-10-25 ENCOUNTER — Encounter: Payer: Self-pay | Admitting: Internal Medicine

## 2021-10-25 ENCOUNTER — Ambulatory Visit: Payer: Medicare PPO | Admitting: Internal Medicine

## 2021-10-25 VITALS — BP 130/70 | HR 97 | Temp 97.7°F | Resp 14 | Ht 65.0 in | Wt 294.0 lb

## 2021-10-25 DIAGNOSIS — Z780 Asymptomatic menopausal state: Secondary | ICD-10-CM | POA: Insufficient documentation

## 2021-10-25 DIAGNOSIS — E118 Type 2 diabetes mellitus with unspecified complications: Secondary | ICD-10-CM | POA: Diagnosis not present

## 2021-10-25 DIAGNOSIS — E785 Hyperlipidemia, unspecified: Secondary | ICD-10-CM

## 2021-10-25 DIAGNOSIS — F411 Generalized anxiety disorder: Secondary | ICD-10-CM

## 2021-10-25 DIAGNOSIS — E1169 Type 2 diabetes mellitus with other specified complication: Secondary | ICD-10-CM

## 2021-10-25 DIAGNOSIS — E039 Hypothyroidism, unspecified: Secondary | ICD-10-CM | POA: Diagnosis not present

## 2021-10-25 DIAGNOSIS — M19041 Primary osteoarthritis, right hand: Secondary | ICD-10-CM

## 2021-10-25 DIAGNOSIS — E782 Mixed hyperlipidemia: Secondary | ICD-10-CM | POA: Diagnosis not present

## 2021-10-25 DIAGNOSIS — M21962 Unspecified acquired deformity of left lower leg: Secondary | ICD-10-CM | POA: Diagnosis not present

## 2021-10-25 DIAGNOSIS — M8580 Other specified disorders of bone density and structure, unspecified site: Secondary | ICD-10-CM

## 2021-10-25 DIAGNOSIS — R21 Rash and other nonspecific skin eruption: Secondary | ICD-10-CM | POA: Diagnosis not present

## 2021-10-25 DIAGNOSIS — M130 Polyarthritis, unspecified: Secondary | ICD-10-CM

## 2021-10-25 DIAGNOSIS — I10 Essential (primary) hypertension: Secondary | ICD-10-CM | POA: Diagnosis not present

## 2021-10-25 DIAGNOSIS — M25541 Pain in joints of right hand: Secondary | ICD-10-CM | POA: Diagnosis not present

## 2021-10-25 DIAGNOSIS — M25542 Pain in joints of left hand: Secondary | ICD-10-CM

## 2021-10-25 DIAGNOSIS — E1121 Type 2 diabetes mellitus with diabetic nephropathy: Secondary | ICD-10-CM

## 2021-10-25 DIAGNOSIS — R609 Edema, unspecified: Secondary | ICD-10-CM

## 2021-10-25 LAB — SEDIMENTATION RATE: Sed Rate: 76 mm/hr — ABNORMAL HIGH (ref 0–30)

## 2021-10-25 LAB — C-REACTIVE PROTEIN: CRP: 1.6 mg/dL (ref 0.5–20.0)

## 2021-10-25 MED ORDER — MUPIROCIN 2 % EX OINT: TOPICAL_OINTMENT | Freq: Two times a day (BID) | CUTANEOUS | 0 refills | Status: DC

## 2021-10-25 NOTE — Progress Notes (Addendum)
Subjective:  Patient ID: Bonnie Hinton, female    DOB: 05-13-1940  Age: 81 y.o. MRN: [id number]  CC: The primary encounter diagnosis was Essential hypertension, benign. Diagnoses of Diabetes mellitus with complication ([place]), Mixed hyperlipidemia, Acquired hypothyroidism, Diabetic nephropathy associated with type 2 diabetes mellitus ([place]), Foot deformity, acquired, left, Arthralgia of both hands, OA (osteoarthritis) of finger, right, Rash, Morbid obesity ([place]), Hyperlipidemia associated with type 2 diabetes mellitus ([place]), Osteopenia after menopause, Pitting edema, Anxiety, generalized, and Polyarthritis were also pertinent to this visit.   HPI Bonnie Hinton presents for  Chief Complaint  Patient presents with   Follow-up    Diabetes   1) Type 2 DM with obesity and hyperlipidemia: she is taking Lantus 25 units daily.  Using low carb diet and Golo to manage weight.  She has lost 5 lbs  in the last Month .  No low blood sugars.  No neuropathy.  Walking more with her walker.  Still using wheelchair    2 polyarthralgia :  having pain in hands but not in arms.   [place]'s notes getting worse bilateral.  [place]wan neck deformity bilaterally    Worse by night . Likes to crochet and knit  . Used to use topasil, has not tried voltaren.  Using tylenol 100 mg bid celfbex not helping   3) increased ankle edema.  Using furosemide 20 mg daily.  Red warm rash on left shin comes and goes.    4) collapse arch of left foot.    Outpatient Medications Prior to Visit  Medication Sig Dispense Refill   Accu-Chek Softclix Lancets lancets Use as instructed to check blood sugar at least 3 times daily 100 each 12   acetaminophen (TYLENOL) 325 MG tablet Take 2 tablets (650 mg total) by mouth every 6 (six) hours as needed for mild pain (or Fever >/= 101).     amLODipine (NORVASC) 10 MG tablet Take 1 tablet (10 mg total) by mouth daily. 90 tablet 3   Blood Glucose Monitoring Suppl (ACCU-CHEK GUIDE) w/Device  KIT USE AS DIRECTED 1 kit 1   celecoxib (CELEBREX) 100 MG capsule Take 1 capsule (100 mg total) by mouth 2 (two) times daily. 180 capsule 3   cetirizine (ZYRTEC) 10 MG tablet TAKE 1 TABLET EVERY DAY 90 tablet 1   Cholecalciferol (VITAMIN D3) 2000 units TABS Take 1 tablet by mouth daily.     Continuous Blood Gluc Sensor (FREESTYLE LIBRE 2 SENSOR) MISC Apply 1 each topically every 14 (fourteen) days.     cycloSPORINE (RESTASIS) 0.05 % ophthalmic emulsion Place 1 drop into both eyes 2 (two) times daily.     dicyclomine (BENTYL) 20 MG tablet Take 1 tablet (20 mg total) by mouth 4 (four) times daily. Before meals and at bedtime 270 tablet 3   ezetimibe (ZETIA) 10 MG tablet TAKE 1 TABLET DAILY 90 tablet 3   furosemide (LASIX) 20 MG tablet Take 1 tablet (20 mg total) by mouth daily. 90 tablet 3   glucose blood (ACCU-CHEK GUIDE) test strip Use as instructed 100 each 12   insulin glargine (LANTUS SOLOSTAR) 100 UNIT/ML Solostar Pen Inject 25 Units into the skin daily. Increase as directed by physician 15 mL 6   Insulin Pen Needle (B-D UF III MINI PEN NEEDLES) 31G X 5 MM MISC USE 1 NEEDLE DAILY 90 each 3   levothyroxine (SYNTHROID) 137 MCG tablet TAKE 1 TABLET DAILY BEFORE BREAKFAST 90 tablet 1   LUMIGAN 0.01 % SOLN Place 1-2 drops into  both eyes at bedtime. 5 mL 0   nystatin cream (MYCOSTATIN) Apply topically 2 (two) times daily. 90 g 2   pantoprazole (PROTONIX) 40 MG tablet Take 1 tablet (40 mg total) by mouth daily. 90 tablet 3   spironolactone (ALDACTONE) 50 MG tablet Take 1 tablet (50 mg total) by mouth daily. 90 tablet 5   tiZANidine (ZANAFLEX) 4 MG tablet TAKE 1 TABLET EVERY 6 HOURS AS NEEDED FOR MUSCLE SPASM(S) 270 tablet 0   tolterodine (DETROL LA) 4 MG 24 hr capsule Take 1 capsule (4 mg total) by mouth 2 (two) times daily. 180 capsule 3   amoxicillin (AMOXIL) 500 MG capsule TAKE 4 CAPSULES BY MOUTH 1 HOUR PRIOR TO DENTAL PROCEDURE (Patient not taking: Reported on 04/25/2021) 4 capsule 2   No  facility-administered medications prior to visit.    Review of Systems;  Patient denies headache, fevers, malaise, unintentional weight loss, skin rash, eye pain, sinus congestion and sinus pain, sore throat, dysphagia,  hemoptysis , cough, dyspnea, wheezing, chest pain, palpitations, orthopnea, edema, abdominal pain, nausea, melena, diarrhea, constipation, flank pain, dysuria, hematuria, urinary  Frequency, nocturia, numbness, tingling, seizures,  Focal weakness, Loss of consciousness,  Tremor, insomnia, depression, anxiety, and suicidal ideation.      Objective:  BP 130/70 (BP Location: Left Arm, Patient Position: Sitting, Cuff Size: Normal)   Pulse 97   Temp 97.7 F (36.5 C) (Oral)   Resp 14   Ht 5' 5"  (1.651 m)   Wt 294 lb (133.4 kg)   SpO2 96%   BMI 48.92 kg/m   BP Readings from Last 3 Encounters:  10/25/21 130/70  07/23/21 140/74  05/29/21 140/70    Wt Readings from Last 3 Encounters:  10/25/21 294 lb (133.4 kg)  07/23/21 299 lb (135.6 kg)  05/29/21 289 lb (131.1 kg)    General appearance: alert, cooperative and appears stated age Ears: normal TM's and external ear canals both ears Throat: lips, mucosa, and tongue normal; teeth and gums normal Neck: no adenopathy, no carotid bruit, supple, symmetrical, trachea midline and thyroid not enlarged, symmetric, no tenderness/mass/nodules Back: symmetric, no curvature. ROM normal. No CVA tenderness. Lungs: clear to auscultation bilaterally Heart: regular rate and rhythm, S1, S2 normal, no murmur, click, rub or gallop Abdomen: soft, non-tender; bowel sounds normal; no masses,  no organomegaly Pulses: 2+ and symmetric Skin: Skin color, texture, turgor normal. No rashes or lesions Lymph nodes: Cervical, supraclavicular, and axillary nodes normal.  Lab Results  Component Value Date   HGBA1C 7.2 (H) 10/23/2021   HGBA1C 6.9 (H) 07/20/2021   HGBA1C 8.6 (H) 04/20/2021    Lab Results  Component Value Date   CREATININE 0.93  10/23/2021   CREATININE 0.90 07/20/2021   CREATININE 0.86 04/20/2021    Lab Results  Component Value Date   WBC 8.4 06/10/2018   HGB 14.9 06/10/2018   HCT 45.1 06/10/2018   PLT 306.0 06/10/2018   GLUCOSE 130 (H) 10/23/2021   CHOL 236 (H) 10/23/2021   TRIG 166.0 (H) 10/23/2021   HDL 48.60 10/23/2021   LDLDIRECT 159.0 10/23/2021   LDLCALC 155 (H) 10/23/2021   ALT 9 10/23/2021   AST 15 10/23/2021   NA 137 10/23/2021   K 4.7 10/23/2021   CL 101 10/23/2021   CREATININE 0.93 10/23/2021   BUN 22 10/23/2021   CO2 23 10/23/2021   TSH 0.74 10/23/2021   INR 1.37 09/04/2016   HGBA1C 7.2 (H) 10/23/2021   MICROALBUR 14.6 (H) 04/20/2021    MM DIAG  BREAST TOMO UNI LEFT  Result Date: 08/08/2021 CLINICAL DATA:  The patient was called back for 3 adjacent left breast masses. EXAM: DIGITAL DIAGNOSTIC UNILATERAL LEFT MAMMOGRAM WITH TOMOSYNTHESIS AND CAD; ULTRASOUND LEFT BREAST LIMITED TECHNIQUE: Left digital diagnostic mammography and breast tomosynthesis was performed. The images were evaluated with computer-aided detection.; Targeted ultrasound examination of the left breast was performed. COMPARISON:  Previous exam(s). ACR Breast Density Category b: There are scattered areas of fibroglandular density. FINDINGS: The 3 adjacent left breast masses persists on today's imaging. I suspect intramammary lymph nodes. Targeted ultrasound is performed, showing a definitive intramammary lymph node with normal morphology at 1 o'clock, 8 cm from the nipple. Two smaller masses are identified at 1 o'clock, 8 cm from the nipple as well. One of these 2 adjacent smaller masses measures 5 x 4 x 3 mm and the other measures 3 by 4 x 2 mm. IMPRESSION: One of the 3 masses is definitively a normal lymph node. The other 2 are too small to characterize but probably lymph nodes. RECOMMENDATION: Recommend six-month follow-up ultrasound of the 2 small masses in the left breast at 1 o'clock, 8 cm from the nipple, likely intramammary  nodes. I have discussed the findings and recommendations with the patient. If applicable, a reminder letter will be sent to the patient regarding the next appointment. BI-RADS CATEGORY  3: Probably benign. Electronically Signed   By: Dorise Bullion III M.D.   On: 08/08/2021 10:42  US BREAST LTD UNI LEFT INC AXILLA  Result Date: 08/08/2021 CLINICAL DATA:  The patient was called back for 3 adjacent left breast masses. EXAM: DIGITAL DIAGNOSTIC UNILATERAL LEFT MAMMOGRAM WITH TOMOSYNTHESIS AND CAD; ULTRASOUND LEFT BREAST LIMITED TECHNIQUE: Left digital diagnostic mammography and breast tomosynthesis was performed. The images were evaluated with computer-aided detection.; Targeted ultrasound examination of the left breast was performed. COMPARISON:  Previous exam(s). ACR Breast Density Category b: There are scattered areas of fibroglandular density. FINDINGS: The 3 adjacent left breast masses persists on today's imaging. I suspect intramammary lymph nodes. Targeted ultrasound is performed, showing a definitive intramammary lymph node with normal morphology at 1 o'clock, 8 cm from the nipple. Two smaller masses are identified at 1 o'clock, 8 cm from the nipple as well. One of these 2 adjacent smaller masses measures 5 x 4 x 3 mm and the other measures 3 by 4 x 2 mm. IMPRESSION: One of the 3 masses is definitively a normal lymph node. The other 2 are too small to characterize but probably lymph nodes. RECOMMENDATION: Recommend six-month follow-up ultrasound of the 2 small masses in the left breast at 1 o'clock, 8 cm from the nipple, likely intramammary nodes. I have discussed the findings and recommendations with the patient. If applicable, a reminder letter will be sent to the patient regarding the next appointment. BI-RADS CATEGORY  3: Probably benign. Electronically Signed   By: Dorise Bullion III M.D.   On: 08/08/2021 10:42   Assessment & Plan:   Problem List Items Addressed This Visit     Rash    Left  shin, may be venous stasis dermatitis given 2 + edema aggravated by furosemide suspension on travel days  But has history of insect bite on abdomen that become purulent.  Trial of mupirocin bid, if no change in one week will prescribed triamcinolone      Polyarthritis    She has swan neck deformities of all fingers,  And elevated ESR and a positive rheumatoid factor.  Referring to Rheumatology  Relevant Orders   Ambulatory referral to Rheumatology   Pitting edema    She has no sign o fheart failure      Osteopenia after menopause    By May 2023 DEXA. Continue calcium and vitamin d supplementation, increase weight bearing exercise as tolerated       OA (osteoarthritis) of finger, right    Trial of voltaren; advised to reduce hours of knitting daily.  Checking esr/crp/rh factor      Morbid obesity ([place])    encouaraged her to stick with GOLO for now.  Repeat eval in 3 months      Hyperlipidemia associated with type 2 diabetes mellitus ([facility])    Statin intolerant.  Taking Zetia.  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 Referring to podiatry; may need foot brace or special shoe      Relevant Orders   Ambulatory referral to Podiatry   Essential hypertension, benign - Primary   Relevant Orders   Comprehensive metabolic panel   Hemoglobin A1c   Diabetic nephropathy associated with type 2 diabetes mellitus ([facility])    Lisinopril intolerant due to hyperkalemia.  Proteinuria stable and BP controlled. Continue control of diabetes with lantus.  Will consider adding [place] or Jardiance at follow up  Lab Results  Component Value Date   HGBA1C 7.2 (H) 10/23/2021    Lab Results  Component Value Date   MICROALBUR 14.6 (H) 04/20/2021   MICROALBUR 15.6 (H) 12/31/2019           Relevant Orders   Comprehensive metabolic panel   Hemoglobin A1c   Anxiety, generalized   Acquired hypothyroidism   Relevant Orders   TSH   Other Visit Diagnoses     Diabetes mellitus with complication ([facility])       Arthralgia of both hands       Relevant Orders   Sedimentation rate (Completed)   Rheumatoid Factor (Completed)   C-reactive protein (Completed)       I spent a total of  45 minutes with this patient in a face to face visit on the date of this encounter reviewing the last office visit with me , patient'ss diet and eating habits, home blood pressure readings ,  most recent imaging study ,   and post visit ordering of testing and therapeutics.    Follow-up: No follow-ups on file.   Crecencio Mc, MD

## 2021-10-25 NOTE — Assessment & Plan Note (Signed)
Statin intolerant.  Taking Zetia.  LDL not at goal but without h/o CAD will not be able to get PCSK9 inhibitors approved  Lab Results  Component Value Date   CHOL 236 (H) 10/23/2021   HDL 48.60 10/23/2021   LDLCALC 155 (H) 10/23/2021   LDLDIRECT 159.0 10/23/2021   TRIG 166.0 (H) 10/23/2021   CHOLHDL 5 10/23/2021

## 2021-10-25 NOTE — Patient Instructions (Addendum)
Please finish your shingles vaccine series this month   Your diabetes is under very good control on your current regimen .  Continue Lantus 25 units daily  Lab Results  Component Value Date   HGBA1C 7.2 (H) 10/23/2021    Try using Voltaren gel for your hand pain  .   Cut back on the hours of knitting!    I have prescribed mupirocin ointment for the red rash on your left leg.  Use twice daily  for  one week   Continue Zetia for cholesterol  management   The arch in your left foot seems to have collapsed.  Podiatry referral in progress

## 2021-10-25 NOTE — Assessment & Plan Note (Signed)
Left shin, may be venous stasis dermatitis given 2 + edema aggravated by furosemide suspension on travel days  But has history of insect bite on abdomen that become purulent.  Trial of mupirocin bid, if no change in one week will prescribed triamcinolone

## 2021-10-25 NOTE — Assessment & Plan Note (Signed)
She has no sign o fheart failure

## 2021-10-25 NOTE — Assessment & Plan Note (Signed)
Trial of voltaren; advised to reduce hours of knitting daily.  Checking esr/crp/rh factor

## 2021-10-25 NOTE — Assessment & Plan Note (Signed)
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 Referring to podiatry; may need foot brace or special shoe

## 2021-10-25 NOTE — Assessment & Plan Note (Signed)
Lisinopril intolerant due to hyperkalemia.  Proteinuria stable and BP controlled. Continue control of diabetes with lantus.  Will consider adding Farxiga or Jardiance at follow up  Lab Results  Component Value Date   HGBA1C 7.2 (H) 10/23/2021   Lab Results  Component Value Date   MICROALBUR 14.6 (H) 04/20/2021   MICROALBUR 15.6 (H) 12/31/2019

## 2021-10-25 NOTE — Assessment & Plan Note (Signed)
By May 2023 DEXA. Continue calcium and vitamin d supplementation, increase weight bearing exercise as tolerated

## 2021-10-25 NOTE — Assessment & Plan Note (Signed)
encouaraged her to stick with GOLO for now.  Repeat eval in 3 months

## 2021-10-26 DIAGNOSIS — M130 Polyarthritis, unspecified: Secondary | ICD-10-CM | POA: Insufficient documentation

## 2021-10-26 LAB — RHEUMATOID FACTOR: Rheumatoid fact SerPl-aCnc: 27 IU/mL — ABNORMAL HIGH (ref <14)

## 2021-10-26 NOTE — Addendum Note (Signed)
Addended by: Sherlene Shams on: 10/26/2021 09:50 PM   Modules accepted: Orders

## 2021-10-26 NOTE — Assessment & Plan Note (Signed)
She has swan neck deformities of all fingers,  And elevated ESR and a positive rheumatoid factor.  Referring to Rheumatology

## 2021-10-26 NOTE — Assessment & Plan Note (Deleted)
She has swan neck deformities of all fingers,  And elevated ESR and a positive rheumatoid factor.  Referring to Rheumatology

## 2021-11-07 ENCOUNTER — Other Ambulatory Visit: Payer: Self-pay | Admitting: Internal Medicine

## 2021-11-09 ENCOUNTER — Telehealth: Payer: Self-pay | Admitting: Internal Medicine

## 2021-11-09 NOTE — Telephone Encounter (Signed)
Lft pt vm to call ofc . thanks 

## 2021-11-14 ENCOUNTER — Ambulatory Visit: Payer: Medicare PPO | Admitting: Podiatry

## 2021-11-14 ENCOUNTER — Encounter: Payer: Self-pay | Admitting: Podiatry

## 2021-11-14 DIAGNOSIS — M79676 Pain in unspecified toe(s): Secondary | ICD-10-CM | POA: Diagnosis not present

## 2021-11-14 DIAGNOSIS — M2170 Unequal limb length (acquired), unspecified site: Secondary | ICD-10-CM | POA: Diagnosis not present

## 2021-11-14 DIAGNOSIS — B351 Tinea unguium: Secondary | ICD-10-CM | POA: Diagnosis not present

## 2021-11-14 DIAGNOSIS — M216X2 Other acquired deformities of left foot: Secondary | ICD-10-CM

## 2021-11-15 ENCOUNTER — Telehealth: Payer: Self-pay

## 2021-11-15 NOTE — Progress Notes (Signed)
  Subjective:  Patient ID: Bonnie Hinton, female    DOB: [birth year],  MRN: [id number]  Chief Complaint  Patient presents with   Debridement    Trim toenails   Foot Pain    Medial left foot - concerned about foot rolling in when she walks and gets very painful at times    81 y.o. female presents with the above complaint. History confirmed with patient.  Return nails are thick and elongated causing pain.  She has a history of hip and knee surgery on both knees  Objective:  Physical Exam: warm, good capillary refill, no trophic changes or ulcerative lesions, normal DP and PT pulses, normal sensory exam, and 3 to 4 cm limb with discrepancy with right leg shorter than left, she has compensatory pes planovalgus, left. Left Foot: dystrophic yellowed discolored nail plates with subungual debris Right Foot: dystrophic yellowed discolored nail plates with subungual debris  Assessment:   1. Pain due to onychomycosis of toenail   2. Acquired pes valgus of left foot   3. Acquired unequal limb length      Plan:  Patient was evaluated and treated and all questions answered.  Discussed the etiology and treatment options for the condition in detail with the patient. Educated patient on the topical and oral treatment options for mycotic nails. Recommended debridement of the nails today. Sharp and mechanical debridement performed of all painful and mycotic nails today. Nails debrided in length and thickness using a nail nipper to level of comfort. Discussed treatment options including appropriate shoe gear. Follow up as needed for painful nails.   Discussed with her that her severe pes planovalgus deformity arch collapse on the left side is compensatory due to the significant amount of limb extremity noted.  I recommended a shoe lift for the right side and a prescription for this was written for [facility].  She also may benefit from an AFO [place] brace to stabilize the left foot and ankle.   This was written as part of the Rx as well.  I will see her back as needed for this  Return in about 3 months (around 02/14/2022) for painful nails .

## 2021-11-15 NOTE — Telephone Encounter (Signed)
Completed.

## 2021-11-28 ENCOUNTER — Other Ambulatory Visit: Payer: Self-pay | Admitting: Internal Medicine

## 2021-12-17 DIAGNOSIS — M159 Polyosteoarthritis, unspecified: Secondary | ICD-10-CM | POA: Insufficient documentation

## 2021-12-17 DIAGNOSIS — M0579 Rheumatoid arthritis with rheumatoid factor of multiple sites without organ or systems involvement: Secondary | ICD-10-CM | POA: Insufficient documentation

## 2021-12-17 DIAGNOSIS — Z7960 Long term (current) use of unspecified immunomodulators and immunosuppressants: Secondary | ICD-10-CM | POA: Diagnosis not present

## 2021-12-17 DIAGNOSIS — M189 Osteoarthritis of first carpometacarpal joint, unspecified: Secondary | ICD-10-CM | POA: Diagnosis not present

## 2021-12-17 DIAGNOSIS — Z1159 Encounter for screening for other viral diseases: Secondary | ICD-10-CM | POA: Diagnosis not present

## 2021-12-17 DIAGNOSIS — M5136 Other intervertebral disc degeneration, lumbar region: Secondary | ICD-10-CM | POA: Diagnosis not present

## 2022-01-12 ENCOUNTER — Other Ambulatory Visit: Payer: Self-pay | Admitting: Internal Medicine

## 2022-01-12 DIAGNOSIS — E118 Type 2 diabetes mellitus with unspecified complications: Secondary | ICD-10-CM

## 2022-01-15 DIAGNOSIS — E1165 Type 2 diabetes mellitus with hyperglycemia: Secondary | ICD-10-CM | POA: Diagnosis not present

## 2022-01-20 ENCOUNTER — Other Ambulatory Visit: Payer: Self-pay | Admitting: Internal Medicine

## 2022-01-22 ENCOUNTER — Other Ambulatory Visit: Payer: Self-pay | Admitting: Internal Medicine

## 2022-01-22 ENCOUNTER — Other Ambulatory Visit (INDEPENDENT_AMBULATORY_CARE_PROVIDER_SITE_OTHER): Payer: Medicare PPO

## 2022-01-22 DIAGNOSIS — N63 Unspecified lump in unspecified breast: Secondary | ICD-10-CM

## 2022-01-22 DIAGNOSIS — E782 Mixed hyperlipidemia: Secondary | ICD-10-CM | POA: Diagnosis not present

## 2022-01-22 DIAGNOSIS — E1121 Type 2 diabetes mellitus with diabetic nephropathy: Secondary | ICD-10-CM | POA: Diagnosis not present

## 2022-01-22 DIAGNOSIS — R928 Other abnormal and inconclusive findings on diagnostic imaging of breast: Secondary | ICD-10-CM

## 2022-01-22 DIAGNOSIS — E039 Hypothyroidism, unspecified: Secondary | ICD-10-CM | POA: Diagnosis not present

## 2022-01-22 DIAGNOSIS — I10 Essential (primary) hypertension: Secondary | ICD-10-CM

## 2022-01-22 LAB — LIPID PANEL
Cholesterol: 211 mg/dL — ABNORMAL HIGH (ref 0–200)
HDL: 50.9 mg/dL (ref >39.00)
LDL Cholesterol: 128 mg/dL — ABNORMAL HIGH (ref 0–99)
NonHDL: 160.53
Total CHOL/HDL Ratio: 4
Triglycerides: 164 mg/dL — ABNORMAL HIGH (ref 0.0–149.0)
VLDL: 32.8 mg/dL (ref 0.0–40.0)

## 2022-01-22 LAB — COMPREHENSIVE METABOLIC PANEL
ALT: 11 U/L (ref 0–35)
AST: 14 U/L (ref 0–37)
Albumin: 4.4 g/dL (ref 3.5–5.2)
Alkaline Phosphatase: 74 U/L (ref 39–117)
BUN: 19 mg/dL (ref 6–23)
CO2: 22 mEq/L (ref 19–32)
Calcium: 9.1 mg/dL (ref 8.4–10.5)
Chloride: 102 mEq/L (ref 96–112)
Creatinine, Ser: 0.89 mg/dL (ref 0.40–1.20)
GFR: 60.97 mL/min (ref >60.00)
Glucose, Bld: 127 mg/dL — ABNORMAL HIGH (ref 70–99)
Potassium: 4.4 mEq/L (ref 3.5–5.1)
Sodium: 136 mEq/L (ref 135–145)
Total Bilirubin: 0.3 mg/dL (ref 0.2–1.2)
Total Protein: 7.6 g/dL (ref 6.0–8.3)

## 2022-01-22 LAB — HEMOGLOBIN A1C: Hgb A1c MFr Bld: 7.3 % — ABNORMAL HIGH (ref 4.6–6.5)

## 2022-01-22 LAB — LDL CHOLESTEROL, DIRECT: Direct LDL: 138 mg/dL

## 2022-01-22 LAB — TSH: TSH: 9.69 u[IU]/mL — ABNORMAL HIGH (ref 0.35–5.50)

## 2022-01-25 ENCOUNTER — Encounter: Payer: Self-pay | Admitting: Internal Medicine

## 2022-01-25 ENCOUNTER — Ambulatory Visit: Payer: Medicare PPO | Admitting: Internal Medicine

## 2022-01-25 VITALS — BP 134/70 | HR 90 | Temp 98.1°F | Ht 65.0 in | Wt 330.0 lb

## 2022-01-25 DIAGNOSIS — E118 Type 2 diabetes mellitus with unspecified complications: Secondary | ICD-10-CM

## 2022-01-25 DIAGNOSIS — E039 Hypothyroidism, unspecified: Secondary | ICD-10-CM

## 2022-01-25 DIAGNOSIS — E785 Hyperlipidemia, unspecified: Secondary | ICD-10-CM | POA: Diagnosis not present

## 2022-01-25 DIAGNOSIS — E1169 Type 2 diabetes mellitus with other specified complication: Secondary | ICD-10-CM | POA: Diagnosis not present

## 2022-01-25 DIAGNOSIS — E1121 Type 2 diabetes mellitus with diabetic nephropathy: Secondary | ICD-10-CM | POA: Diagnosis not present

## 2022-01-25 DIAGNOSIS — Z23 Encounter for immunization: Secondary | ICD-10-CM

## 2022-01-25 DIAGNOSIS — I10 Essential (primary) hypertension: Secondary | ICD-10-CM

## 2022-01-25 MED ORDER — LEVOTHYROXINE SODIUM 150 MCG PO TABS: 150.0000 ug | ORAL_TABLET | Freq: Every day | ORAL | 3 refills | Status: DC
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
## 2022-01-25 NOTE — Patient Instructions (Addendum)
Please contact your eye doctor ASAP for your eye exam.  Tell him that you have diabetes AND are taking placquenil   Your thyroid is underactive on your current levothyroxine dose; I have sent a higher dose to your mail order pharmacy and would like you to start it.  Marland Kitchen  Until it comes,  you can take one extra dose of  the 137 mcg one day per  week (on Saturdays,  take 2  of the current dose . Take one tablet all other days )  Your goal blood sugar is 80 to 120 fasting,  and 100 to 160 post prandial (2 hrs after eating)   Breakfast notes: Waffles  and pancakes  still have LOTS OF carbohydrates . Pay attention to the carbs and serving size.  You would do better to have an Atkins or Premier Protein shake   Also try "Just Crack n Egg"  product.  Easy microavewable souffle \ [employer] frittatas are also low carb and microwave in 3 minutes !

## 2022-01-27 NOTE — Assessment & Plan Note (Addendum)
Thyroid is underactive on  137 mcg daily .   Reviewed medicatio nadminsitration; she is taking it correctly..  increase dose to 150 mcg daily  Lab Results  Component Value Date   TSH 9.69 (H) 01/22/2022

## 2022-01-27 NOTE — Assessment & Plan Note (Signed)
She has adequate control of diabetes with lantus only.  Given her deconditoning. Obesity, and  limited mobility,  adding [place] or [place] would increase her risk for adverse events.  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  No changes today .   Lab Results  Component Value Date   HGBA1C 7.3 (H) 01/22/2022   Lab Results  Component Value Date   MICROALBUR 14.6 (H) 04/20/2021   MICROALBUR 15.6 (H) 12/31/2019

## 2022-01-27 NOTE — Assessment & Plan Note (Signed)
She is Statin intolerant.  Tolerating Zetia.  she has no known history of heart disease .  Lab Results  Component Value Date   CHOL 211 (H) 01/22/2022   HDL 50.90 01/22/2022   LDLCALC 128 (H) 01/22/2022   LDLDIRECT 138.0 01/22/2022   TRIG 164.0 (H) 01/22/2022   CHOLHDL 4 01/22/2022

## 2022-01-29 DIAGNOSIS — R202 Paresthesia of skin: Secondary | ICD-10-CM | POA: Diagnosis not present

## 2022-01-29 DIAGNOSIS — M542 Cervicalgia: Secondary | ICD-10-CM | POA: Diagnosis not present

## 2022-01-30 ENCOUNTER — Other Ambulatory Visit: Payer: Self-pay | Admitting: Neurology

## 2022-01-30 DIAGNOSIS — R202 Paresthesia of skin: Secondary | ICD-10-CM

## 2022-01-30 DIAGNOSIS — M542 Cervicalgia: Secondary | ICD-10-CM

## 2022-01-31 ENCOUNTER — Other Ambulatory Visit: Payer: Self-pay | Admitting: *Deleted

## 2022-01-31 DIAGNOSIS — R2689 Other abnormalities of gait and mobility: Secondary | ICD-10-CM | POA: Diagnosis not present

## 2022-01-31 MED ORDER — BD SWAB SINGLE USE REGULAR PADS: MEDICATED_PAD | 3 refills | Status: DC

## 2022-02-11 ENCOUNTER — Ambulatory Visit
Admission: RE | Admit: 2022-02-11 | Discharge: 2022-02-11 | Disposition: A | Discharge status: Home / Self Care | Payer: Medicare PPO | Source: Ambulatory Visit | Attending: Neurology | Admitting: Neurology

## 2022-02-11 DIAGNOSIS — M542 Cervicalgia: Secondary | ICD-10-CM | POA: Diagnosis not present

## 2022-02-11 DIAGNOSIS — R202 Paresthesia of skin: Secondary | ICD-10-CM | POA: Insufficient documentation

## 2022-02-11 DIAGNOSIS — M4312 Spondylolisthesis, cervical region: Secondary | ICD-10-CM | POA: Diagnosis not present

## 2022-02-11 DIAGNOSIS — R200 Anesthesia of skin: Secondary | ICD-10-CM | POA: Diagnosis not present

## 2022-02-18 DIAGNOSIS — Z7960 Long term (current) use of unspecified immunomodulators and immunosuppressants: Secondary | ICD-10-CM | POA: Diagnosis not present

## 2022-02-18 DIAGNOSIS — M0579 Rheumatoid arthritis with rheumatoid factor of multiple sites without organ or systems involvement: Secondary | ICD-10-CM | POA: Diagnosis not present

## 2022-02-18 DIAGNOSIS — H019 Unspecified inflammation of eyelid: Secondary | ICD-10-CM | POA: Diagnosis not present

## 2022-02-19 ENCOUNTER — Telehealth: Payer: Self-pay | Admitting: Internal Medicine

## 2022-02-19 ENCOUNTER — Other Ambulatory Visit: Payer: Medicare PPO

## 2022-02-19 NOTE — Telephone Encounter (Signed)
Spoke with pt and scheduled her to see Dr. Birdie Sons for tomorrow at 4:30.

## 2022-02-19 NOTE — Telephone Encounter (Signed)
Patient saw Dr Allena Katz today for her hands, he questioned her about her left eye. He told her not to go to her eye doctor, but to go to her primary doctor to have the eye checked. He believes it may be infected. No appointments in office with any providers. Patient does not want to go to urgent care.

## 2022-02-20 ENCOUNTER — Encounter: Payer: Self-pay | Admitting: Family Medicine

## 2022-02-20 ENCOUNTER — Ambulatory Visit: Payer: Medicare PPO | Admitting: Family Medicine

## 2022-02-20 VITALS — BP 128/78 | HR 89 | Temp 98.4°F | Ht 65.0 in | Wt 330.0 lb

## 2022-02-20 DIAGNOSIS — H00014 Hordeolum externum left upper eyelid: Secondary | ICD-10-CM | POA: Diagnosis not present

## 2022-02-20 MED ORDER — POLYMYXIN B-TRIMETHOPRIM 10000-0.1 UNIT/ML-% OP SOLN: 1.0000 [drp] | Freq: Four times a day (QID) | OPHTHALMIC | 0 refills | Status: DC

## 2022-02-20 NOTE — Patient Instructions (Signed)
Nice to see you. The ophthalmologist should contact you to set up an appointment. Please try the eyedrops to see if that may help with your symptoms. Please continue the warm compresses. If you have redness spreading out from the area that it is currently please let us know immediately.

## 2022-02-20 NOTE — Assessment & Plan Note (Signed)
Patient with a stye that is likely converted to a chalazion.  Discussed she needs to see ophthalmology to have a procedure to resolve this issue.  Discussed we could try antibiotic eyedrops to see if that would help with this though she will need to keep the appointment with ophthalmology unless the area resolves.  She will continue warm compresses.  If she has any spreading redness she will let us know and we can consider oral antibiotics.

## 2022-02-20 NOTE — Progress Notes (Signed)
Tommi Rumps, MD Phone: [phone]  Bonnie Hinton is a 81 y.o. female who presents today for same-day visit.  Left eyelid lesion: Patient notes 3 weeks ago she developed a bump on her left upper eyelid.  She notes some watering of her eyes at night and some crusting.  She notes no eye pain or vision changes.  No photophobia.  She notes the bump has been red over the bump that the redness has not been spreading.  She has been trying warm compresses with no benefit.  Social History   Tobacco Use  Smoking Status Never  Smokeless Tobacco Never    Current Outpatient Medications on File Prior to Visit  Medication Sig Dispense Refill   ACCU-CHEK GUIDE test strip USE AS INSTRUCTED 300 strip 10   Accu-Chek Softclix Lancets lancets USE AS INSTRUCTED TO CHECK BLOOD SUGAR AT LEAST 3 TIMES DAILY 300 each 10   acetaminophen (TYLENOL) 325 MG tablet Take 2 tablets (650 mg total) by mouth every 6 (six) hours as needed for mild pain (or Fever >/= 101).     Alcohol Swabs (B-D SINGLE USE SWABS REGULAR) PADS Use as needed to clean injection site 100 each 3   amLODipine (NORVASC) 10 MG tablet TAKE 1 TABLET EVERY DAY 90 tablet 10   amoxicillin (AMOXIL) 500 MG capsule TAKE 4 CAPSULES BY MOUTH 1 HOUR PRIOR TO DENTAL PROCEDURE 4 capsule 2   Blood Glucose Monitoring Suppl (ACCU-CHEK GUIDE) w/Device KIT USE AS DIRECTED 1 kit 1   celecoxib (CELEBREX) 100 MG capsule TAKE 1 CAPSULE TWICE DAILY 180 capsule 10   cetirizine (ZYRTEC) 10 MG tablet TAKE 1 TABLET EVERY DAY 90 tablet 1   Cholecalciferol (VITAMIN D3) 2000 units TABS Take 1 tablet by mouth daily.     Continuous Blood Gluc Sensor (FREESTYLE LIBRE 2 SENSOR) MISC Apply 1 each topically every 14 (fourteen) days.     cycloSPORINE (RESTASIS) 0.05 % ophthalmic emulsion Place 1 drop into both eyes 2 (two) times daily.     dicyclomine (BENTYL) 20 MG tablet TAKE 1 TABLET FOUR TIMES DAILY BEFORE MEALS AND AT BEDTIME 360 tablet 0   DROPLET PEN NEEDLES 31G X 5  MM MISC USE DAILY 100 each 10   ezetimibe (ZETIA) 10 MG tablet TAKE 1 TABLET EVERY DAY 90 tablet 10   furosemide (LASIX) 20 MG tablet Take 1 tablet (20 mg total) by mouth daily. 90 tablet 3   hydroxychloroquine (PLAQUENIL) 200 MG tablet Take 200 mg by mouth 2 (two) times daily.     LANTUS SOLOSTAR 100 UNIT/ML Solostar Pen INJECT 25 UNITS INTO THE SKIN DAILY. INCREASE AS DIRECTED BY PHYSICIAN 30 mL 10   levothyroxine (SYNTHROID) 150 MCG tablet Take 1 tablet (150 mcg total) by mouth daily. 90 tablet 3   LUMIGAN 0.01 % SOLN Place 1-2 drops into both eyes at bedtime. 5 mL 0   mupirocin ointment (BACTROBAN) 2 % Apply topically 2 (two) times daily. 22 g 0   nystatin cream (MYCOSTATIN) APPLY TO THE AFFECTED AREA(S) TWICE DAILY 90 g 2   pantoprazole (PROTONIX) 40 MG tablet TAKE 1 TABLET EVERY DAY 90 tablet 10   spironolactone (ALDACTONE) 50 MG tablet Take 1 tablet (50 mg total) by mouth daily. 90 tablet 5   tiZANidine (ZANAFLEX) 4 MG tablet TAKE 1 TABLET EVERY 6 HOURS AS NEEDED FOR MUSCLE SPASM(S) 270 tablet 10   tolterodine (DETROL LA) 4 MG 24 hr capsule TAKE 1 CAPSULE TWICE DAILY 180 capsule 10   No current  facility-administered medications on file prior to visit.     ROS see history of present illness  Objective  Physical Exam Vitals:   02/20/22 1646  BP: 128/78  Pulse: 89  Temp: 98.4 F (36.9 C)  SpO2: 98%    BP Readings from Last 3 Encounters:  02/20/22 128/78  01/25/22 134/70  10/25/21 130/70   Wt Readings from Last 3 Encounters:  02/20/22 (!) 330 lb (149.7 kg)  01/25/22 (!) 330 lb (149.7 kg)  10/25/21 294 lb (133.4 kg)    Physical Exam Eyes:     General: Vision grossly intact.        Right eye: No discharge.        Left eye: No discharge.     Conjunctiva/sclera: Conjunctivae normal.      Comments: Left upper lid everted with no signs of foreign body      Assessment/Plan: Please see individual problem list.  Problem List Items Addressed This Visit      Hordeolum externum of left upper eyelid - Primary    Patient with a stye that is likely converted to a chalazion.  Discussed she needs to see ophthalmology to have a procedure to resolve this issue.  Discussed we could try antibiotic eyedrops to see if that would help with this though she will need to keep the appointment with ophthalmology unless the area resolves.  She will continue warm compresses.  If she has any spreading redness she will let us know and we can consider oral antibiotics.      Relevant Medications   trimethoprim-polymyxin b (POLYTRIM) ophthalmic solution   Other Relevant Orders   Ambulatory referral to Ophthalmology     Return if symptoms worsen or fail to improve.   Tommi Rumps, MD [place]

## 2022-02-21 ENCOUNTER — Ambulatory Visit: Payer: Medicare PPO | Admitting: Podiatry

## 2022-02-21 ENCOUNTER — Encounter: Payer: Self-pay | Admitting: Podiatry

## 2022-02-21 DIAGNOSIS — M79676 Pain in unspecified toe(s): Secondary | ICD-10-CM | POA: Diagnosis not present

## 2022-02-21 DIAGNOSIS — B351 Tinea unguium: Secondary | ICD-10-CM

## 2022-02-21 DIAGNOSIS — E1121 Type 2 diabetes mellitus with diabetic nephropathy: Secondary | ICD-10-CM | POA: Diagnosis not present

## 2022-02-21 DIAGNOSIS — E1142 Type 2 diabetes mellitus with diabetic polyneuropathy: Secondary | ICD-10-CM

## 2022-02-21 NOTE — Progress Notes (Signed)
This patient returns to my office for at risk foot care.  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  This patient is unable to cut nails herself since the patient cannot reach her  nails.These nails are painful walking and wearing shoes.  This patient presents for at risk foot care today.  General Appearance  Alert, conversant and in no acute stress.  Vascular  Dorsalis pedis and posterior tibial  pulses are not palpable due to swelling  bilaterally.  Capillary return is within normal limits  bilaterally. Temperature is within normal limits  bilaterally.  Neurologic  Senn-Weinstein monofilament wire test within normal limits  bilaterally. Muscle power within normal limits bilaterally.  Nails Thick disfigured discolored nails with subungual debris  from hallux to fifth toes bilaterally. No evidence of bacterial infection or drainage bilaterally.  Orthopedic  No limitations of motion  feet .  No crepitus or effusions noted.  Contracted digits  B/L.  Severe PTTD left foot.   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  Hammer toes 2,3 right foot.  Skin  normotropic skin with no porokeratosis noted bilaterally.  No signs of infections or ulcers noted.     Onychomycosis  Pain in right toes  Pain in left toes  Consent was obtained for treatment procedures.   Mechanical debridement of nails 1-5  bilaterally performed with a nail nipper.  Filed with dremel without incident. No infection or ulcer.     Return office visit 3 months         Told patient to return for periodic foot care and evaluation due to potential at risk complications.   Krishna Dancel DPM  

## 2022-02-27 DIAGNOSIS — H0014 Chalazion left upper eyelid: Secondary | ICD-10-CM | POA: Diagnosis not present

## 2022-03-03 ENCOUNTER — Other Ambulatory Visit: Payer: Self-pay | Admitting: Internal Medicine

## 2022-03-04 ENCOUNTER — Other Ambulatory Visit: Payer: Self-pay | Admitting: Family Medicine

## 2022-03-04 DIAGNOSIS — H00014 Hordeolum externum left upper eyelid: Secondary | ICD-10-CM

## 2022-03-04 NOTE — Telephone Encounter (Signed)
Refilled: 10/25/2021 Last OV: 02/20/2022 Next OV: 04/29/2022

## 2022-03-06 ENCOUNTER — Ambulatory Visit
Admission: RE | Admit: 2022-03-06 | Discharge: 2022-03-06 | Disposition: A | Discharge status: Home / Self Care | Payer: Medicare PPO | Source: Ambulatory Visit | Attending: Internal Medicine | Admitting: Internal Medicine

## 2022-03-06 DIAGNOSIS — N63 Unspecified lump in unspecified breast: Secondary | ICD-10-CM

## 2022-03-06 DIAGNOSIS — R928 Other abnormal and inconclusive findings on diagnostic imaging of breast: Secondary | ICD-10-CM

## 2022-03-06 DIAGNOSIS — N6321 Unspecified lump in the left breast, upper outer quadrant: Secondary | ICD-10-CM | POA: Diagnosis not present

## 2022-03-08 ENCOUNTER — Telehealth: Payer: Self-pay | Admitting: Internal Medicine

## 2022-03-08 NOTE — Telephone Encounter (Signed)
Copied from CRM [id number]. Topic: Medicare AWV >> Mar 08, 2022 11:01 AM Payton Doughty wrote: Reason for CRM: Left message for patient to schedule Annual Wellness Visit.  Please schedule with Nurse Health Advisor Babs Sciara, LPN at [facility]. This appt can be telephone or office visit.  Please call [phone] ask for [facility]

## 2022-03-11 ENCOUNTER — Other Ambulatory Visit: Payer: Self-pay | Admitting: Internal Medicine

## 2022-03-22 ENCOUNTER — Ambulatory Visit (INDEPENDENT_AMBULATORY_CARE_PROVIDER_SITE_OTHER): Payer: Medicare PPO

## 2022-03-22 VITALS — Ht 65.0 in | Wt 330.0 lb

## 2022-03-22 DIAGNOSIS — Z0000 Encounter for general adult medical examination without abnormal findings: Secondary | ICD-10-CM | POA: Diagnosis not present

## 2022-03-22 NOTE — Progress Notes (Addendum)
Subjective:   Bonnie Hinton is a 81 y.o. female who presents for Medicare Annual (Subsequent) preventive examination.  Review of Systems    No ROS.  Medicare Wellness Virtual Visit.  Visual/audio telehealth visit, UTA vital signs.   See social history for additional risk factors.   Cardiac Risk Factors include: advanced age (>28men, >67 women);diabetes mellitus;hypertension     Objective:    Today's Vitals   03/22/22 1108  Weight: (!) 330 lb (149.7 kg)  Height: 5\' 5"  (1.651 m)   Body mass index is 54.91 kg/m.     03/22/2022   11:14 AM 03/14/2021    3:54 PM 12/28/2019   11:32 AM 12/25/2018   10:17 AM 12/23/2017   10:49 AM 09/04/2016    4:42 PM 04/05/2016    8:45 PM  Advanced Directives  Does Patient Have a Medical Advance Directive? Yes Yes Yes Yes Yes Yes No;Yes  Type of Estate agent of [place];Living will Healthcare Power of [place];Living will Healthcare Power of [place];Living will Healthcare Power of [place];Living will Living will;Healthcare Power of [employer] Power of [place];Living will   Does patient want to make changes to medical advance directive? No - Patient declined No - Patient declined No - Patient declined No - Patient declined No - Patient declined No - Patient declined   Copy of Healthcare Power of Attorney in Chart? No - copy requested No - copy requested No - copy requested No - copy requested No - copy requested No - copy requested     Current Medications (verified) Outpatient Encounter Medications as of 03/22/2022  Medication Sig   ACCU-CHEK GUIDE test strip USE AS INSTRUCTED   Accu-Chek Softclix Lancets lancets USE AS INSTRUCTED TO CHECK BLOOD SUGAR AT LEAST 3 TIMES DAILY   acetaminophen (TYLENOL) 325 MG tablet Take 2 tablets (650 mg total) by mouth every 6 (six) hours as needed for mild pain (or Fever >/= 101).   Alcohol Swabs (B-D SINGLE USE SWABS REGULAR) PADS Use as needed to clean injection site    amLODipine (NORVASC) 10 MG tablet TAKE 1 TABLET EVERY DAY   amoxicillin (AMOXIL) 500 MG capsule TAKE 4 CAPSULES BY MOUTH 1 HOUR PRIOR TO DENTAL PROCEDURE   Blood Glucose Monitoring Suppl (ACCU-CHEK GUIDE) w/Device KIT USE AS DIRECTED   celecoxib (CELEBREX) 100 MG capsule TAKE 1 CAPSULE TWICE DAILY   cetirizine (ZYRTEC) 10 MG tablet TAKE 1 TABLET EVERY DAY   Cholecalciferol (VITAMIN D3) 2000 units TABS Take 1 tablet by mouth daily.   Continuous Blood Gluc Sensor (FREESTYLE LIBRE 2 SENSOR) MISC Apply 1 each topically every 14 (fourteen) days.   cycloSPORINE (RESTASIS) 0.05 % ophthalmic emulsion Place 1 drop into both eyes 2 (two) times daily.   dicyclomine (BENTYL) 20 MG tablet TAKE 1 TABLET FOUR TIMES DAILY BEFORE MEALS AND AT BEDTIME   DROPLET PEN NEEDLES 31G X 5 MM MISC USE DAILY   ezetimibe (ZETIA) 10 MG tablet TAKE 1 TABLET EVERY DAY   furosemide (LASIX) 20 MG tablet Take 1 tablet (20 mg total) by mouth daily.   hydroxychloroquine (PLAQUENIL) 200 MG tablet Take 200 mg by mouth 2 (two) times daily.   LANTUS SOLOSTAR 100 UNIT/ML Solostar Pen INJECT 25 UNITS INTO THE SKIN DAILY. INCREASE AS DIRECTED BY PHYSICIAN   levothyroxine (SYNTHROID) 150 MCG tablet Take 1 tablet (150 mcg total) by mouth daily.   LUMIGAN 0.01 % SOLN Place 1-2 drops into both eyes at bedtime.   mupirocin ointment (BACTROBAN) 2 %  APPLY TOPICALLY TWICE A DAY   nystatin cream (MYCOSTATIN) APPLY TO THE AFFECTED AREA(S) TWICE DAILY   pantoprazole (PROTONIX) 40 MG tablet TAKE 1 TABLET EVERY DAY   spironolactone (ALDACTONE) 50 MG tablet Take 1 tablet (50 mg total) by mouth daily.   tiZANidine (ZANAFLEX) 4 MG tablet TAKE 1 TABLET EVERY 6 HOURS AS NEEDED FOR MUSCLE SPASM(S)   tolterodine (DETROL LA) 4 MG 24 hr capsule TAKE 1 CAPSULE TWICE DAILY   trimethoprim-polymyxin b (POLYTRIM) ophthalmic solution PLACE 1 DROP INTO THE LEFT EYE EVERY 6 HOURS.   No facility-administered encounter medications on file as of 03/22/2022.     Allergies (verified) Erythromycin, Fentanyl, Iodinated contrast media, Atorvastatin, Metformin and related, Victoza [liraglutide], Latex, Lisinopril, and Other   History: Past Medical History:  Diagnosis Date   Anxiety    Arthritis    Choledocholithiasis    Chronic back pain    stenosis and arthritis   Community acquired pneumonia 05/29/2021   Complication of anesthesia    anxious when she wakes up   GERD (gastroesophageal reflux disease)    takes Pantoprazole daily   Glaucoma    dry angle   History of bronchitis    30+ yrs ago   History of MRSA infection 2004   several yrs ago   Hyperlipidemia    takes Zetia and Crestor daily   Hypertension    take Amlodipine daily   Hypothyroidism    takes Synthroid daily   Joint pain    Joint swelling    Neuropathy    takes Gabapentin daily   Pancreatitis, gallstone    Peripheral edema    takes Furosemide daily as needed   Pneumonia    hx of-30 + yrs ago   Pre-diabetes    takes Victoza daily   Primary localized osteoarthritis of right knee    Subdural hematoma (HCC) 8 yrs ago   hx of   Urinary frequency    Urinary urgency    Past Surgical History:  Procedure Laterality Date   ABDOMINAL HYSTERECTOMY     CHOLECYSTECTOMY N/A 09/09/2016   Procedure: LAPAROSCOPIC CHOLECYSTECTOMY;  Surgeon: Manus Rudd, MD;  Location: [facility] OR;  Service: General;  Laterality: N/A;   COLONOSCOPY     RADIOLOGY WITH ANESTHESIA N/A 08/22/2015   Procedure: MRI OF LUMBAR SPINE   (RADIOLOGY WITH ANESTHESIA);  Surgeon: Medication Radiologist, MD;  Location: [facility] OR;  Service: Radiology;  Laterality: N/A;   TOTAL HIP ARTHROPLASTY Right    TOTAL HIP ARTHROPLASTY Left    TOTAL KNEE ARTHROPLASTY Left    TOTAL KNEE ARTHROPLASTY Right 01/01/2016   Procedure: TOTAL KNEE ARTHROPLASTY;  Surgeon: Salvatore Marvel, MD;  Location[facility];  Service: Orthopedics;  Laterality: Right;   Family History  Problem Relation Age of Onset   Cancer Father    Cancer Brother     Diabetes Maternal Aunt    Social History   Socioeconomic History   Marital status: Widowed    Spouse name: Not on file   Number of children: Not on file   Years of education: Not on file   Highest education level: Not on file  Occupational History   Not on file  Tobacco Use   Smoking status: Never   Smokeless tobacco: Never  Vaping Use   Vaping Use: Never used  Substance and Sexual Activity   Alcohol use: No    Alcohol/week: 0.0 standard drinks of alcohol   Drug use: No   Sexual activity: Not on file  Other  Topics Concern   Not on file  Social History Narrative   Not on file   Social Determinants of Health   Financial Resource Strain: Low Risk  (03/22/2022)   Overall Financial Resource Strain (CARDIA)    Difficulty of Paying Living Expenses: Not hard at all  Food Insecurity: No Food Insecurity (03/22/2022)   Hunger Vital Sign    Worried About Running Out of Food in the Last Year: Never true    Ran Out of Food in the Last Year: Never true  Transportation Needs: No Transportation Needs (03/22/2022)   PRAPARE - Administrator, Civil Service (Medical): No    Lack of Transportation (Non-Medical): No  Physical Activity: Unknown (03/22/2022)   Exercise Vital Sign    Days of Exercise per Week: 7 days    Minutes of Exercise per Session: Not on file  Stress: No Stress Concern Present (03/22/2022)   [employer] of Occupational Health - Occupational Stress Questionnaire    Feeling of Stress : Not at all  Social Connections: Unknown (03/22/2022)   Social Connection and Isolation Panel [NHANES]    Frequency of Communication with Friends and Family: More than three times a week    Frequency of Social Gatherings with Friends and Family: More than three times a week    Attends Religious Services: Not on Marketing executive or Organizations: Not on file    Attends Banker Meetings: Never    Marital Status: Not on file    Tobacco  Counseling Counseling given: Not Answered   Clinical Intake:  Pre-visit preparation completed: Yes        Diabetes: Yes (Followed by pcp)  How often do you need to have someone help you when you read instructions, pamphlets, or other written materials from your doctor or pharmacy?: 1 - Never    Interpreter Needed?: No      Activities of Daily Living    03/22/2022   11:09 AM  In your present state of health, do you have any difficulty performing the following activities:  Hearing? 0  Vision? 0  Difficulty concentrating or making decisions? 0  Walking or climbing stairs? 1  Comment Paces self with activity. Wheelchair in use as needed  Dressing or bathing? 0  Doing errands, shopping? 0  Preparing Food and eating ? N  Using the Toilet? N  In the past six months, have you accidently leaked urine? N  Comment Managed with medication and daily liner  Do you have problems with loss of bowel control? N  Managing your Medications? N  Managing your Finances? N  Housekeeping or managing your Housekeeping? N    Patient Care Team: Sherlene Shams, MD as PCP - General (Internal Medicine)  Indicate any recent Medical Services you may have received from other than [facility] providers in the past year (date may be approximate).     Assessment:   This is a routine wellness examination for [place].  I connected with  [name] on 03/22/22 by a audio enabled telemedicine application and verified that I am speaking with the correct person using two identifiers.  Patient Location: Home  Provider Location: Office/Clinic  I discussed the limitations of evaluation and management by telemedicine. The patient expressed understanding and agreed to proceed.   Hearing/Vision screen Hearing Screening - Comments:: Patient is able to hear conversational tones without difficulty. No issues reported. Vision Screening - Comments:: Followed by [facility]  Wears  corrective  lenses They have regular follow up with the ophthalmologist  Dietary issues and exercise activities discussed: Current Exercise Habits: Home exercise routine, Type of exercise: walking;strength training/weights, Time (Minutes): 60, Frequency (Times/Week): 7, Weekly Exercise (Minutes/Week): 420, Intensity: Mild   Goals Addressed               This Visit's Progress     Patient Stated     I plan to start the GOLO diet (pt-stated)   On track     Portion control       Depression Screen    03/22/2022   11:11 AM 02/20/2022    4:48 PM 01/25/2022   10:18 AM 10/25/2021    9:44 AM 03/14/2021    3:55 PM 08/16/2020   11:02 AM 12/28/2019   11:24 AM  PHQ 2/9 Scores  PHQ - 2 Score 0 0 0 0 0 0 0    Fall Risk    03/22/2022   11:12 AM 02/20/2022    4:48 PM 01/25/2022   10:18 AM 10/25/2021    9:44 AM 05/29/2021    9:56 AM  Fall Risk   Falls in the past year?  1 0 0 0  Number falls in past yr:  1  0   Injury with Fall?  1  0   Risk for fall due to : Impaired balance/gait History of fall(s) No Fall Risks No Fall Risks No Fall Risks  Follow up Falls evaluation completed;Falls prevention discussed Falls evaluation completed Falls evaluation completed Falls evaluation completed Falls evaluation completed    FALL RISK PREVENTION PERTAINING TO THE HOME: Home free of loose throw rugs in walkways, pet beds, electrical cords, etc? Yes  Adequate lighting in your home to reduce risk of falls? Yes   ASSISTIVE DEVICES UTILIZED TO PREVENT FALLS: Life alert? No  Use of a cane, walker or w/c? Yes  Grab bars in the bathroom? Yes  Shower chair or bench in shower? Yes  Elevated toilet seat or a handicapped toilet? Yes   TIMED UP AND GO: Was the test performed? No .   Cognitive Function:    12/23/2017   11:04 AM  MMSE - Mini Mental State Exam  Orientation to time 5  Orientation to Place 5  Registration 3  Attention/ Calculation 5  Recall 2  Language- name 2 objects 2  Language- repeat 1   Language- follow 3 step command 3  Language- read & follow direction 1  Write a sentence 1  Copy design 1  Total score 29        03/22/2022   11:34 AM 12/28/2019   11:34 AM 12/25/2018   10:32 AM  6CIT Screen  What Year? 0 points 0 points 0 points  What month? 0 points 0 points 0 points  What time? 0 points 0 points 0 points  Count back from 20 0 points  0 points  Months in reverse 0 points  0 points  Repeat phrase 0 points  0 points  Total Score 0 points  0 points    Immunizations Immunization History  Administered Date(s) Administered   Fluad Quad(high Dose 65+) 01/01/2019, 12/31/2019, 01/17/2021, 01/25/2022   Influenza, High Dose Seasonal PF 01/23/2015, 12/04/2015, 12/25/2016, 12/23/2017   Moderna Sars-Covid-2 Vaccination 04/07/2019, 05/05/2019, 03/02/2020   Pneumococcal Conjugate-13 04/25/2015   Pneumococcal Polysaccharide-23 02/05/2018   Tdap 01/20/2020   Zoster Recombinat (Shingrix) 05/03/2021, 12/24/2021   Screening Tests Health Maintenance  Topic Date Due   OPHTHALMOLOGY EXAM  05/19/2021  Diabetic kidney evaluation - Urine ACR  04/20/2022   COVID-19 Vaccine (4 - 2023-24 season) 04/07/2022 (Originally 11/23/2021)   HEMOGLOBIN A1C  07/23/2022   FOOT EXAM  10/26/2022   Diabetic kidney evaluation - eGFR measurement  01/23/2023   Medicare Annual Wellness (AWV)  03/23/2023   DTaP/Tdap/Td (2 - Td or Tdap) 01/19/2030   Pneumonia Vaccine 38+ Years old  Completed   INFLUENZA VACCINE  Completed   DEXA SCAN  Completed   Zoster Vaccines- Shingrix  Completed   HPV VACCINES  Aged Out   Health Maintenance Health Maintenance Due  Topic Date Due   OPHTHALMOLOGY EXAM  05/19/2021   Diabetic kidney evaluation - Urine ACR  04/20/2022   Mammogram- completed 03/06/22  Lung Cancer Screening: (Low Dose CT Chest recommended if Age 49-80 years, 30 pack-year currently smoking OR have quit w/in 15years.) does not qualify.   Hepatitis C Screening: does not qualify.  Vision  Screening: Recommended annual ophthalmology exams for early detection of glaucoma and other disorders of the eye.  Dental Screening: Recommended annual dental exams for proper oral hygiene.  Community Resource Referral / Chronic Care Management: CRR required this visit?  No   CCM required this visit?  No      Plan:     I have personally reviewed and noted the following in the patient's chart:   Medical and social history Use of alcohol, tobacco or illicit drugs  Current medications and supplements including opioid prescriptions. Patient is not currently taking opioid prescriptions. Functional ability and status Nutritional status Physical activity Advanced directives List of other physicians Hospitalizations, surgeries, and ER visits in previous 12 months Vitals Screenings to include cognitive, depression, and falls Referrals and appointments  In addition, I have reviewed and discussed with patient certain preventive protocols, quality metrics, and best practice recommendations. A written personalized care plan for preventive services as well as general preventive health recommendations were provided to patient.     Tishie Altmann L Motley, LPN   16/12/9602     I have reviewed the above information and agree with above.   Duncan Dull, MD

## 2022-03-22 NOTE — Patient Instructions (Addendum)
Bonnie Hinton , Thank you for taking time to come for your Medicare Wellness Visit. I appreciate your ongoing commitment to your health goals. Please review the following plan we discussed and let me know if I can assist you in the future.   These are the goals we discussed:  Goals       Patient Stated     I plan to start the GOLO diet (pt-stated)      Portion control        This is a list of the screening recommended for you and due dates:  Health Maintenance  Topic Date Due   Eye exam for diabetics  05/19/2021   Yearly kidney health urinalysis for diabetes  04/20/2022   COVID-19 Vaccine (4 - 2023-24 season) 04/07/2022*   Hemoglobin A1C  07/23/2022   Complete foot exam   10/26/2022   Yearly kidney function blood test for diabetes  01/23/2023   Medicare Annual Wellness Visit  03/23/2023   DTaP/Tdap/Td vaccine (2 - Td or Tdap) 01/19/2030   Pneumonia Vaccine  Completed   Flu Shot  Completed   DEXA scan (bone density measurement)  Completed   Zoster (Shingles) Vaccine  Completed   HPV Vaccine  Aged Out  *Topic was postponed. The date shown is not the original due date.    Advanced directives: End of life planning; Advance aging; Advanced directives discussed.  Copy of current HCPOA/Living Will requested.    Conditions/risks identified: none new  Next appointment: Follow up in one year for your annual wellness visit    Preventive Care 65 Years and Older, Female Preventive care refers to lifestyle choices and visits with your health care provider that can promote health and wellness. What does preventive care include? A yearly physical exam. This is also called an annual well check. Dental exams once or twice a year. Routine eye exams. Ask your health care provider how often you should have your eyes checked. Personal lifestyle choices, including: Daily care of your teeth and gums. Regular physical activity. Eating a healthy diet. Avoiding tobacco and drug use. Limiting  alcohol use. Practicing safe sex. Taking low-dose aspirin every day. Taking vitamin and mineral supplements as recommended by your health care provider. What happens during an annual well check? The services and screenings done by your health care provider during your annual well check will depend on your age, overall health, lifestyle risk factors, and family history of disease. Counseling  Your health care provider may ask you questions about your: Alcohol use. Tobacco use. Drug use. Emotional well-being. Home and relationship well-being. Sexual activity. Eating habits. History of falls. Memory and ability to understand (cognition). Work and work Astronomer. Reproductive health. Screening  You may have the following tests or measurements: Height, weight, and BMI. Blood pressure. Lipid and cholesterol levels. These may be checked every 5 years, or more frequently if you are over 53 years old. Skin check. Lung cancer screening. You may have this screening every year starting at age 51 if you have a 30-pack-year history of smoking and currently smoke or have quit within the past 15 years. Fecal occult blood test (FOBT) of the stool. You may have this test every year starting at age 64. Flexible sigmoidoscopy or colonoscopy. You may have a sigmoidoscopy every 5 years or a colonoscopy every 10 years starting at age 64. Hepatitis C blood test. Hepatitis B blood test. Sexually transmitted disease (STD) testing. Diabetes screening. This is done by checking your blood sugar (glucose) after  you have not eaten for a while (fasting). You may have this done every 1-3 years. Bone density scan. This is done to screen for osteoporosis. You may have this done starting at age 23. Mammogram. This may be done every 1-2 years. Talk to your health care provider about how often you should have regular mammograms. Talk with your health care provider about your test results, treatment options, and if  necessary, the need for more tests. Vaccines  Your health care provider may recommend certain vaccines, such as: Influenza vaccine. This is recommended every year. Tetanus, diphtheria, and acellular pertussis (Tdap, Td) vaccine. You may need a Td booster every 10 years. Zoster vaccine. You may need this after age 51. Pneumococcal 13-valent conjugate (PCV13) vaccine. One dose is recommended after age 25. Pneumococcal polysaccharide (PPSV23) vaccine. One dose is recommended after age 7. Talk to your health care provider about which screenings and vaccines you need and how often you need them. This information is not intended to replace advice given to you by your health care provider. Make sure you discuss any questions you have with your health care provider. Document Released: 04/07/2015 Document Revised: 11/29/2015 Document Reviewed: 01/10/2015 Elsevier Interactive Patient Education  2017 [place] Prevention in the Home Falls can cause injuries. They can happen to people of all ages. There are many things you can do to make your home safe and to help prevent falls. What can I do on the outside of my home? Regularly fix the edges of walkways and driveways and fix any cracks. Remove anything that might make you trip as you walk through a door, such as a raised step or threshold. Trim any bushes or trees on the path to your home. Use bright outdoor lighting. Clear any walking paths of anything that might make someone trip, such as rocks or tools. Regularly check to see if handrails are loose or broken. Make sure that both sides of any steps have handrails. Any raised decks and porches should have guardrails on the edges. Have any leaves, snow, or ice cleared regularly. Use sand or salt on walking paths during winter. Clean up any spills in your garage right away. This includes oil or grease spills. What can I do in the bathroom? Use night lights. Install grab bars by the toilet  and in the tub and shower. Do not use towel bars as grab bars. Use non-skid mats or decals in the tub or shower. If you need to sit down in the shower, use a plastic, non-slip stool. Keep the floor dry. Clean up any water that spills on the floor as soon as it happens. Remove soap buildup in the tub or shower regularly. Attach bath mats securely with double-sided non-slip rug tape. Do not have throw rugs and other things on the floor that can make you trip. What can I do in the bedroom? Use night lights. Make sure that you have a light by your bed that is easy to reach. Do not use any sheets or blankets that are too big for your bed. They should not hang down onto the floor. Have a firm chair that has side arms. You can use this for support while you get dressed. Do not have throw rugs and other things on the floor that can make you trip. What can I do in the kitchen? Clean up any spills right away. Avoid walking on wet floors. Keep items that you use a lot in easy-to-reach places. If you  need to reach something above you, use a strong step stool that has a grab bar. Keep electrical cords out of the way. Do not use floor polish or wax that makes floors slippery. If you must use wax, use non-skid floor wax. Do not have throw rugs and other things on the floor that can make you trip. What can I do with my stairs? Do not leave any items on the stairs. Make sure that there are handrails on both sides of the stairs and use them. Fix handrails that are broken or loose. Make sure that handrails are as long as the stairways. Check any carpeting to make sure that it is firmly attached to the stairs. Fix any carpet that is loose or worn. Avoid having throw rugs at the top or bottom of the stairs. If you do have throw rugs, attach them to the floor with carpet tape. Make sure that you have a light switch at the top of the stairs and the bottom of the stairs. If you do not have them, ask someone to add  them for you. What else can I do to help prevent falls? Wear shoes that: Do not have high heels. Have rubber bottoms. Are comfortable and fit you well. Are closed at the toe. Do not wear sandals. If you use a stepladder: Make sure that it is fully opened. Do not climb a closed stepladder. Make sure that both sides of the stepladder are locked into place. Ask someone to hold it for you, if possible. Clearly mark and make sure that you can see: Any grab bars or handrails. First and last steps. Where the edge of each step is. Use tools that help you move around (mobility aids) if they are needed. These include: Canes. Walkers. Scooters. Crutches. Turn on the lights when you go into a dark area. Replace any light bulbs as soon as they burn out. Set up your furniture so you have a clear path. Avoid moving your furniture around. If any of your floors are uneven, fix them. If there are any pets around you, be aware of where they are. Review your medicines with your doctor. Some medicines can make you feel dizzy. This can increase your chance of falling. Ask your doctor what other things that you can do to help prevent falls. This information is not intended to replace advice given to you by your health care provider. Make sure you discuss any questions you have with your health care provider. Document Released: 01/05/2009 Document Revised: 08/17/2015 Document Reviewed: 04/15/2014 Elsevier Interactive Patient Education  2017 [employer].

## 2022-03-29 ENCOUNTER — Telehealth: Payer: Self-pay | Admitting: Internal Medicine

## 2022-03-29 MED ORDER — AMOXICILLIN-POT CLAVULANATE 875-125 MG PO TABS: 1.0000 | ORAL_TABLET | Freq: Two times a day (BID) | ORAL | 0 refills | Status: DC

## 2022-03-29 MED ORDER — BENZONATATE 200 MG PO CAPS: 200.0000 mg | ORAL_CAPSULE | Freq: Three times a day (TID) | ORAL | 1 refills | Status: DC | PRN

## 2022-03-29 NOTE — Telephone Encounter (Signed)
Pt called staying that she would like to speak to Dr. Derrel Nip because she feels very sick. Per pt she has a very bad cold that its getting to her lungs. I offered her to be triage by the telephone nurse and she refused, she just want to hear from provider.

## 2022-03-29 NOTE — Telephone Encounter (Signed)
Spoke with pt and informed her of the medications that have been sent in. Pt was also advised that if she develops SOBr and chest pain to call 911. Pt gave a verbal understanding.

## 2022-03-29 NOTE — Telephone Encounter (Signed)
Augmentin and tessalon perles sent to [facility]. Please tell patient that if she develops shortness of breath or chest pain she needs to call 911 and be taken to hospital  Needs follow up appt in 7 to 10 days

## 2022-03-29 NOTE — Telephone Encounter (Signed)
Spoke with pt and she stated that she has chest congestion and a productive cough that is keeping her awake at night. Pt stated that she is coughing up brown phlegm. Advised pt that she needs to be seen and she stated that she is unable to go anywhere because she is too weak and afraid to drive. Pt would like to know if you could call something in for her.

## 2022-04-03 ENCOUNTER — Other Ambulatory Visit: Payer: Self-pay | Admitting: Internal Medicine

## 2022-04-04 NOTE — Telephone Encounter (Addendum)
Refilled: 03/05/2022 Last OV: 01/25/2022 Next OV: 04/29/2022

## 2022-04-15 DIAGNOSIS — E1165 Type 2 diabetes mellitus with hyperglycemia: Secondary | ICD-10-CM | POA: Diagnosis not present

## 2022-04-15 DIAGNOSIS — M159 Polyosteoarthritis, unspecified: Secondary | ICD-10-CM | POA: Diagnosis not present

## 2022-04-15 DIAGNOSIS — M0579 Rheumatoid arthritis with rheumatoid factor of multiple sites without organ or systems involvement: Secondary | ICD-10-CM | POA: Diagnosis not present

## 2022-04-15 DIAGNOSIS — Z7960 Long term (current) use of unspecified immunomodulators and immunosuppressants: Secondary | ICD-10-CM | POA: Diagnosis not present

## 2022-04-17 DIAGNOSIS — R200 Anesthesia of skin: Secondary | ICD-10-CM | POA: Diagnosis not present

## 2022-04-25 ENCOUNTER — Other Ambulatory Visit (INDEPENDENT_AMBULATORY_CARE_PROVIDER_SITE_OTHER): Payer: Medicare PPO

## 2022-04-25 DIAGNOSIS — E1169 Type 2 diabetes mellitus with other specified complication: Secondary | ICD-10-CM

## 2022-04-25 DIAGNOSIS — E118 Type 2 diabetes mellitus with unspecified complications: Secondary | ICD-10-CM

## 2022-04-25 DIAGNOSIS — E785 Hyperlipidemia, unspecified: Secondary | ICD-10-CM | POA: Diagnosis not present

## 2022-04-25 DIAGNOSIS — I10 Essential (primary) hypertension: Secondary | ICD-10-CM | POA: Diagnosis not present

## 2022-04-25 LAB — COMPREHENSIVE METABOLIC PANEL
ALT: 15 U/L (ref 0–35)
AST: 20 U/L (ref 0–37)
Albumin: 4.3 g/dL (ref 3.5–5.2)
Alkaline Phosphatase: 70 U/L (ref 39–117)
BUN: 20 mg/dL (ref 6–23)
CO2: 24 mEq/L (ref 19–32)
Calcium: 9.4 mg/dL (ref 8.4–10.5)
Chloride: 99 mEq/L (ref 96–112)
Creatinine, Ser: 0.92 mg/dL (ref 0.40–1.20)
GFR: 58.49 mL/min — ABNORMAL LOW (ref >60.00)
Glucose, Bld: 163 mg/dL — ABNORMAL HIGH (ref 70–99)
Potassium: 4.8 mEq/L (ref 3.5–5.1)
Sodium: 134 mEq/L — ABNORMAL LOW (ref 135–145)
Total Bilirubin: 0.5 mg/dL (ref 0.2–1.2)
Total Protein: 7.4 g/dL (ref 6.0–8.3)

## 2022-04-25 LAB — LDL CHOLESTEROL, DIRECT: Direct LDL: 162 mg/dL

## 2022-04-25 LAB — LIPID PANEL
Cholesterol: 245 mg/dL — ABNORMAL HIGH (ref 0–200)
HDL: 44.2 mg/dL (ref >39.00)
LDL Cholesterol: 161 mg/dL — ABNORMAL HIGH (ref 0–99)
NonHDL: 200.46
Total CHOL/HDL Ratio: 6
Triglycerides: 199 mg/dL — ABNORMAL HIGH (ref 0.0–149.0)
VLDL: 39.8 mg/dL (ref 0.0–40.0)

## 2022-04-25 LAB — HEMOGLOBIN A1C: Hgb A1c MFr Bld: 7.3 % — ABNORMAL HIGH (ref 4.6–6.5)

## 2022-04-29 ENCOUNTER — Encounter: Payer: Self-pay | Admitting: Internal Medicine

## 2022-04-29 ENCOUNTER — Ambulatory Visit: Payer: Medicare PPO | Admitting: Internal Medicine

## 2022-04-29 VITALS — BP 138/80 | HR 131 | Temp 97.5°F | Ht 65.0 in | Wt 300.0 lb

## 2022-04-29 DIAGNOSIS — E1169 Type 2 diabetes mellitus with other specified complication: Secondary | ICD-10-CM | POA: Diagnosis not present

## 2022-04-29 DIAGNOSIS — K915 Postcholecystectomy syndrome: Secondary | ICD-10-CM

## 2022-04-29 DIAGNOSIS — E785 Hyperlipidemia, unspecified: Secondary | ICD-10-CM

## 2022-04-29 DIAGNOSIS — Z993 Dependence on wheelchair: Secondary | ICD-10-CM

## 2022-04-29 DIAGNOSIS — E039 Hypothyroidism, unspecified: Secondary | ICD-10-CM | POA: Diagnosis not present

## 2022-04-29 DIAGNOSIS — I10 Essential (primary) hypertension: Secondary | ICD-10-CM

## 2022-04-29 DIAGNOSIS — Z7409 Other reduced mobility: Secondary | ICD-10-CM

## 2022-04-29 DIAGNOSIS — E1121 Type 2 diabetes mellitus with diabetic nephropathy: Secondary | ICD-10-CM

## 2022-04-29 DIAGNOSIS — Z96651 Presence of right artificial knee joint: Secondary | ICD-10-CM | POA: Diagnosis not present

## 2022-04-29 MED ORDER — MUPIROCIN 2 % EX OINT: TOPICAL_OINTMENT | CUTANEOUS | 0 refills | Status: DC

## 2022-04-29 MED ORDER — FREESTYLE LIBRE 2 SENSOR MISC: 1.0000 | 3 refills | Status: DC

## 2022-04-29 NOTE — Progress Notes (Addendum)
Subjective:  Patient ID: Bonnie Hinton, female    DOB: April 13, 1940  Age: 82 y.o. MRN: [id number]  CC: The primary encounter diagnosis was Essential hypertension, benign. Diagnoses of Hyperlipidemia associated with type 2 diabetes mellitus ([place]), Diabetic nephropathy associated with type 2 diabetes mellitus ([place]), Acquired hypothyroidism, Morbid obesity ([place]), Status post right knee replacement, Post-cholecystectomy syndrome, Mobility impaired, and Wheelchair dependent were also pertinent to this visit.   HPI CHAUNTE ORRICK presents for  Chief Complaint  Patient presents with   Medical Management of Chronic Issues    Follow up on diabetes, hypertension, hyperlipidemia   1) Type 2 DM /morbid obesity/HTN wth statin intolerance.  She has been using Lantus to manage her blood sugars  due to contraindications to metformin and several other drugs. She has changed optometrists and is now going to [facility]   2) Obesity:  gained weight during protracted illness . Treated for sinusitis In  early January.    Not exercising or eating right  because she is working on her taxes. 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  Manuella Ghazi). Also has CTS by [facility]/[place] studies .  She is going to get steroid injections in both wrists.   3) RA/OA seeing [facility]    4) Breast mass, left:  stable by 6 month follow up  continue for total of 2 years   5) Mobility:  patient she has required continued use of a manual wheel chair for over a year  due to multiple orthopedic issues:  bilateral hip DJD, bilateral knee DJD  lumbar spinal stenosis and morbid obesity .  She cannot ambulate around her home safely on a walker or with a cane due to concurrent  arthritis of both shoulders resulting in bilateral arm weakness  .  She cannot prepare meals from a standing position due to bilateral leg weakness  Her current wheelchair is not functioning well, and the seat is ripping at the sides and is close to tearing completely through    Outpatient Medications Prior to Visit  Medication Sig Dispense Refill   acetaminophen (TYLENOL) 325 MG tablet Take 2 tablets (650 mg total) by mouth every 6 (six) hours as needed for mild pain (or Fever >/= 101).     amLODipine (NORVASC) 10 MG tablet TAKE 1 TABLET EVERY DAY 90 tablet 10   celecoxib (CELEBREX) 100 MG capsule TAKE 1 CAPSULE TWICE DAILY 180 capsule 10   Cholecalciferol (VITAMIN D3) 2000 units TABS Take 1 tablet by mouth daily.     cycloSPORINE (RESTASIS) 0.05 % ophthalmic emulsion Place 1 drop into both eyes 2 (two) times daily.     dicyclomine (BENTYL) 20 MG tablet TAKE 1 TABLET FOUR TIMES DAILY BEFORE MEALS AND AT BEDTIME 360 tablet 0   DROPLET PEN NEEDLES 31G X 5 MM MISC USE DAILY 100 each 10   ezetimibe (ZETIA) 10 MG tablet TAKE 1 TABLET EVERY DAY 90 tablet 10   furosemide (LASIX) 20 MG tablet Take 1 tablet (20 mg total) by mouth daily. 90 tablet 3   hydroxychloroquine (PLAQUENIL) 200 MG tablet Take 200 mg by mouth 2 (two) times daily.     LANTUS SOLOSTAR 100 UNIT/ML Solostar Pen INJECT 25 UNITS  INTO THE SKIN DAILY. INCREASE AS DIRECTED BY PHYSICIAN 30 mL 10   levothyroxine (SYNTHROID) 150 MCG tablet Take 1 tablet (150 mcg total) by mouth daily. 90 tablet 3   LUMIGAN 0.01 % SOLN Place 1-2 drops into both eyes at bedtime. 5 mL 0   nystatin cream (MYCOSTATIN) APPLY TO THE AFFECTED AREA(S) TWICE DAILY 90 g 2   pantoprazole (PROTONIX) 40 MG tablet TAKE 1 TABLET EVERY DAY 90 tablet 10   spironolactone (ALDACTONE) 50 MG tablet Take 1 tablet (50 mg total) by mouth daily. 90 tablet 5   tiZANidine (ZANAFLEX) 4 MG tablet TAKE 1 TABLET EVERY 6 HOURS AS NEEDED FOR MUSCLE SPASM(S) 270 tablet 10   tolterodine  (DETROL LA) 4 MG 24 hr capsule TAKE 1 CAPSULE TWICE DAILY 180 capsule 10   trimethoprim-polymyxin b (POLYTRIM) ophthalmic solution PLACE 1 DROP INTO THE LEFT EYE EVERY 6 HOURS. 10 mL 0   ACCU-CHEK GUIDE test strip USE AS INSTRUCTED 300 strip 10   Accu-Chek Softclix Lancets lancets USE AS INSTRUCTED TO CHECK BLOOD SUGAR AT LEAST 3 TIMES DAILY 300 each 10   Alcohol Swabs (B-D SINGLE USE SWABS REGULAR) PADS Use as needed to clean injection site 100 each 3   Blood Glucose Monitoring Suppl (ACCU-CHEK GUIDE) w/Device KIT USE AS DIRECTED 1 kit 1   cetirizine (ZYRTEC) 10 MG tablet TAKE 1 TABLET EVERY DAY 90 tablet 1   Continuous Blood Gluc Sensor (FREESTYLE LIBRE 2 SENSOR) MISC Apply 1 each topically every 14 (fourteen) days.     mupirocin ointment (BACTROBAN) 2 % APPLY TO AFFECTED AREA TWICE A DAY 22 g 0   amoxicillin (AMOXIL) 500 MG capsule TAKE 4 CAPSULES BY MOUTH 1 HOUR PRIOR TO DENTAL PROCEDURE 4 capsule 2   amoxicillin-clavulanate (AUGMENTIN) 875-125 MG tablet Take 1 tablet by mouth 2 (two) times daily. (Patient not taking: Reported on 04/29/2022) 14 tablet 0   benzonatate (TESSALON) 200 MG capsule Take 1 capsule (200 mg total) by mouth 3 (three) times daily as needed for cough. (Patient not taking: Reported on 04/29/2022) 60 capsule 1   No facility-administered medications prior to visit.    Review of Systems;  Patient denies headache, fevers, malaise, unintentional weight loss, skin rash, eye pain, sinus congestion and sinus pain, sore throat, dysphagia,  hemoptysis , cough, dyspnea, wheezing, chest pain, palpitations, orthopnea, edema, abdominal pain, nausea, melena, diarrhea, constipation, flank pain, dysuria, hematuria, urinary  Frequency, nocturia, numbness, tingling, seizures,  Focal weakness, Loss of consciousness,  Tremor, insomnia, depression, anxiety, and suicidal ideation.      Objective:  BP 138/80   Pulse (!) 131   Temp (!) 97.5 F (36.4 C) (Oral)   Ht 5\' 5"  (1.651 m)   Wt 300 lb  (136.1 kg)   SpO2 96%   BMI 49.92 kg/m   BP Readings from Last 3 Encounters:  05/23/22 (!) 143/90  04/29/22 138/80  02/20/22 128/78    Wt Readings from Last 3 Encounters:  04/29/22 300 lb (136.1 kg)  03/22/22 (!) 330 lb (149.7 kg)  02/20/22 (!) 330 lb (149.7 kg)    Physical Exam Vitals reviewed.  Constitutional:      General: She is not in acute distress.    Appearance: Normal appearance. She is obese. She is not ill-appearing, toxic-appearing or diaphoretic.  HENT:     Head: Normocephalic.  Eyes:     General: No scleral icterus.       Right eye: No discharge.  Left eye: No discharge.     Conjunctiva/sclera: Conjunctivae normal.  Cardiovascular:     Rate and Rhythm: Normal rate and regular rhythm.     Heart sounds: Normal heart sounds.  Pulmonary:     Effort: Pulmonary effort is normal. No respiratory distress.     Breath sounds: Normal breath sounds.  Musculoskeletal:     Right shoulder: Decreased range of motion. Decreased strength.     Left shoulder: Decreased range of motion. Decreased strength.     Right knee: Swelling and deformity present. Decreased range of motion.     Left knee: Swelling and deformity present. Decreased range of motion.  Skin:    General: Skin is warm and dry.  Neurological:     General: No focal deficit present.     Mental Status: She is alert and oriented to person, place, and time. Mental status is at baseline.     Motor: Weakness present.     Gait: Gait abnormal.  Psychiatric:        Mood and Affect: Mood normal.        Behavior: Behavior normal.        Thought Content: Thought content normal.        Judgment: Judgment normal.     Lab Results  Component Value Date   HGBA1C 7.3 (H) 04/25/2022   HGBA1C 7.3 (H) 01/22/2022   HGBA1C 7.2 (H) 10/23/2021    Lab Results  Component Value Date   CREATININE 0.92 04/25/2022   CREATININE 0.89 01/22/2022   CREATININE 0.93 10/23/2021    Lab Results  Component Value Date   WBC  8.4 06/10/2018   HGB 14.9 06/10/2018   HCT 45.1 06/10/2018   PLT 306.0 06/10/2018   GLUCOSE 163 (H) 04/25/2022   CHOL 245 (H) 04/25/2022   TRIG 199.0 (H) 04/25/2022   HDL 44.20 04/25/2022   LDLDIRECT 162.0 04/25/2022   LDLCALC 161 (H) 04/25/2022   ALT 15 04/25/2022   AST 20 04/25/2022   NA 134 (L) 04/25/2022   K 4.8 04/25/2022   CL 99 04/25/2022   CREATININE 0.92 04/25/2022   BUN 20 04/25/2022   CO2 24 04/25/2022   TSH 9.69 (H) 01/22/2022   INR 1.37 09/04/2016   HGBA1C 7.3 (H) 04/25/2022   MICROALBUR 14.6 (H) 04/20/2021    MM DIAG BREAST TOMO UNI LEFT  Result Date: 03/06/2022 CLINICAL DATA:  BI-RADS 3 follow-up of 2 adjacent LEFT breast masses favored to reflect intramammary lymph nodes. EXAM: DIGITAL DIAGNOSTIC UNILATERAL LEFT MAMMOGRAM WITH TOMOSYNTHESIS; ULTRASOUND LEFT BREAST LIMITED TECHNIQUE: Left digital diagnostic mammography and breast tomosynthesis was performed.; Targeted ultrasound examination of the left breast was performed. Best images possible per technologist communication. COMPARISON:  Previous exam(s). ACR Breast Density Category a: The breast tissue is almost entirely fatty. FINDINGS: Diagnostic images LEFT breast demonstrate mammographic stability of 3 adjacent oval circumscribed masses in the LEFT upper outer breast at middle to posterior depth. The larger of the masses has been previously characterized as a benign intramammary lymph node. No new suspicious findings are noted in the LEFT breast. Targeted ultrasound was performed the LEFT upper outer breast. At 1 o'clock 8 cm from nipple, there is revisualization of an oval circumscribed mass. It measures 4 x 3 x 5 mm, previously 4 x 3 x 5 mm. Adjacent mass measures 3 x 4 x 2 mm, previously 3 x 4 x 2 mm. They demonstrate suggestion of internal echogenic hila. IMPRESSION: There are 2 stable probably benign masses likely reflecting  benign intramammary lymph nodes. Recommend follow-up diagnostic mammogram (with ultrasound  if deemed necessary) in 6 months. This will establish 1 year of definitive stability from baseline. RECOMMENDATION: Bilateral diagnostic mammogram (with RIGHT and LEFT breast ultrasound if deemed necessary) in 6 months. I have discussed the findings and recommendations with the patient. If applicable, a reminder letter will be sent to the patient regarding the next appointment. BI-RADS CATEGORY  3: Probably benign. Electronically Signed   By: Valentino Saxon M.D.   On: 03/06/2022 10:36  US BREAST LTD UNI LEFT INC AXILLA  Result Date: 03/06/2022 CLINICAL DATA:  BI-RADS 3 follow-up of 2 adjacent LEFT breast masses favored to reflect intramammary lymph nodes. EXAM: DIGITAL DIAGNOSTIC UNILATERAL LEFT MAMMOGRAM WITH TOMOSYNTHESIS; ULTRASOUND LEFT BREAST LIMITED TECHNIQUE: Left digital diagnostic mammography and breast tomosynthesis was performed.; Targeted ultrasound examination of the left breast was performed. Best images possible per technologist communication. COMPARISON:  Previous exam(s). ACR Breast Density Category a: The breast tissue is almost entirely fatty. FINDINGS: Diagnostic images LEFT breast demonstrate mammographic stability of 3 adjacent oval circumscribed masses in the LEFT upper outer breast at middle to posterior depth. The larger of the masses has been previously characterized as a benign intramammary lymph node. No new suspicious findings are noted in the LEFT breast. Targeted ultrasound was performed the LEFT upper outer breast. At 1 o'clock 8 cm from nipple, there is revisualization of an oval circumscribed mass. It measures 4 x 3 x 5 mm, previously 4 x 3 x 5 mm. Adjacent mass measures 3 x 4 x 2 mm, previously 3 x 4 x 2 mm. They demonstrate suggestion of internal echogenic hila. IMPRESSION: There are 2 stable probably benign masses likely reflecting benign intramammary lymph nodes. Recommend follow-up diagnostic mammogram (with ultrasound if deemed necessary) in 6 months. This will  establish 1 year of definitive stability from baseline. RECOMMENDATION: Bilateral diagnostic mammogram (with RIGHT and LEFT breast ultrasound if deemed necessary) in 6 months. I have discussed the findings and recommendations with the patient. If applicable, a reminder letter will be sent to the patient regarding the next appointment. BI-RADS CATEGORY  3: Probably benign. Electronically Signed   By: Valentino Saxon M.D.   On: 03/06/2022 10:36   Assessment & Plan:  .Essential hypertension, benign -     Comprehensive metabolic panel; Future  Hyperlipidemia associated with type 2 diabetes mellitus ([place]) -     Lipid panel; Future -     LDL cholesterol, direct; Future  Diabetic nephropathy associated with type 2 diabetes mellitus ([place]) Assessment & Plan: She has adequate control of diabetes with lantus only.  Given her deconditoning. Obesity, and  limited mobility,  adding [place] or [place] would increase her risk for adverse events.  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  No changes today .   Lab Results  Component Value Date   HGBA1C 7.3 (H) 04/25/2022   Lab Results  Component Value Date   MICROALBUR 14.6 (H) 04/20/2021   MICROALBUR 15.6 (H) 12/31/2019       Orders: -     Comprehensive metabolic panel; Future -     Hemoglobin A1c; Future  Acquired hypothyroidism -     TSH; Future  Morbid obesity ([place]) Assessment & Plan: She has stopped [place] because she was eating too many desserts that were included.  She has also stopped walking  at home because of tax preparation   Orders: -     For home use only DME standard manual wheelchair with seat cushion  Status post right knee replacement Assessment & Plan: Encouargedto resume walking   Orders: -     For home use only DME standard manual wheelchair with seat cushion  Post-cholecystectomy  syndrome Assessment & Plan: Trial of imodium  Once daily in the morning ,  encouraged to keep a food diary    Mobility impaired Assessment & Plan: Secondary to morbid obesity,  severe DJD knees,  and foot deformity.  New wheelchair needed   Orders: -     For home use only DME standard manual wheelchair with seat cushion  Wheelchair dependent Assessment & Plan: Patient 's mobility has deteriorated..  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  She cannot ambulate around her home safely on a walker or with a cane due to concurrent arthritis of both shoulders resulting in bilateral arm weakness  .  She cannot prepare meals from a standing position due to bilateral leg weakness  .  She is currently able to navigate her home independently in a manual wheelchair,  but her current wheelchair is not functioning well, and the seat is ripping at the sides and is at risk  for tearing completely through    Other orders -     FreeStyle Libre 2 Sensor; Apply 1 each topically every 14 (fourteen) days.  Dispense: 6 each; Refill: 3     I provided 30 minutes of face-to-face time during this encounter reviewing patient's last visit with me, patient's  most recent visit with cardiology,  rheumatology,  and neurology,  recent surgical and non surgical procedures, previous  labs and imaging studies, counseling on currently addressed issues,  and post visit ordering to diagnostics and therapeutics .   Follow-up: Return in about 3 months (around 07/29/2022) for follow up diabetes.   Crecencio Mc, MD

## 2022-04-29 NOTE — Patient Instructions (Addendum)
1) You do NOT have to limit your protein . Your kidney function is normal,  so  The Atkins or the Premier Protein shakes are fine to use daily  for breakfast  and will not raise your blood sugars   Veggies Made Great ! Makes a low carb spinach & egg white frittata  that microwaves individually wrapped portions  Start walking again!     Cherries and berries are fine to eat.  PORTION SIZE MATTERS!  FASTING SUGARS SHOULD BE BETWEEN 100 AND 130  2 HR POST PRANDIALS SHOULD BE 160 OR LOWER (2  HOURS AFTER EATING )  If you continue to have  fasting sugars > 130,  or post prandials > 160 then please increase  your dose of the Lantus to 28 units.    2) Check BP once a week and send me 3 readings

## 2022-04-29 NOTE — Progress Notes (Incomplete)
Subjective:  Patient ID: Bonnie Hinton, female    DOB: 16-Dec-1940  Age: 82 y.o. MRN: [id number]  CC: The primary encounter diagnosis was Essential hypertension, benign. Diagnoses of Hyperlipidemia associated with type 2 diabetes mellitus ([place]), Diabetic nephropathy associated with type 2 diabetes mellitus ([place]), and Acquired hypothyroidism were also pertinent to this visit.   HPI Bonnie Hinton presents for  Chief Complaint  Patient presents with  . Medical Management of Chronic Issues    Follow up on diabetes, hypertension, hyperlipidemia   1) Type 2 DM /morbid obesity/HTN wth statin intolerance.  She has been using Lantus to manage her blood sugars  due to contraindications to metformin and several other drugs. She has changed optometrists and is now going to [facility]   2) Obesity:  gained weight during protracted illness . Treated for sinusitis In  early January.    Not exercising or eating right  because she is working on her taxes. 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  Manuella Ghazi). Also has CTS by [facility] studies .  She is going to get steroid injections in both wrists.   3) RA/OA seeing [facility]    4) Breast mass, left:  stable by 6 month follow up  continue for total of 2 years    Outpatient Medications Prior to Visit  Medication Sig Dispense Refill  . ACCU-CHEK GUIDE test strip USE AS INSTRUCTED 300 strip 10  . Accu-Chek Softclix Lancets lancets USE AS INSTRUCTED TO CHECK BLOOD SUGAR AT LEAST 3 TIMES DAILY 300 each 10  . acetaminophen (TYLENOL) 325 MG tablet Take 2 tablets (650 mg total) by mouth every 6 (six) hours as needed for mild pain (or Fever >/= 101).    . Alcohol Swabs (B-D SINGLE USE SWABS  REGULAR) PADS Use as needed to clean injection site 100 each 3  . amLODipine (NORVASC) 10 MG tablet TAKE 1 TABLET EVERY DAY 90 tablet 10  . Blood Glucose Monitoring Suppl (ACCU-CHEK GUIDE) w/Device KIT USE AS DIRECTED 1 kit 1  . celecoxib (CELEBREX) 100 MG capsule TAKE 1 CAPSULE TWICE DAILY 180 capsule 10  . cetirizine (ZYRTEC) 10 MG tablet TAKE 1 TABLET EVERY DAY 90 tablet 1  . Cholecalciferol (VITAMIN D3) 2000 units TABS Take 1 tablet by mouth daily.    . Continuous Blood Gluc Sensor (FREESTYLE LIBRE 2 SENSOR) MISC Apply 1 each topically every 14 (fourteen) days.    . cycloSPORINE (RESTASIS) 0.05 % ophthalmic emulsion Place 1 drop into both eyes 2 (two) times daily.    Marland Kitchen dicyclomine (BENTYL) 20 MG tablet TAKE 1 TABLET FOUR TIMES DAILY BEFORE MEALS AND AT BEDTIME 360 tablet 0  . DROPLET PEN NEEDLES 31G X 5 MM MISC USE DAILY 100 each 10  . ezetimibe (ZETIA) 10 MG tablet TAKE 1 TABLET EVERY DAY 90 tablet 10  . furosemide (LASIX) 20 MG tablet Take 1 tablet (20 mg total) by mouth daily. 90 tablet 3  . hydroxychloroquine (PLAQUENIL) 200 MG tablet Take 200 mg by mouth 2 (two) times daily.    Marland Kitchen LANTUS SOLOSTAR 100 UNIT/ML Solostar Pen INJECT 25 UNITS INTO THE SKIN DAILY. INCREASE AS DIRECTED BY PHYSICIAN 30 mL 10  . levothyroxine (SYNTHROID)  150 MCG tablet Take 1 tablet (150 mcg total) by mouth daily. 90 tablet 3  . LUMIGAN 0.01 % SOLN Place 1-2 drops into both eyes at bedtime. 5 mL 0  . nystatin cream (MYCOSTATIN) APPLY TO THE AFFECTED AREA(S) TWICE DAILY 90 g 2  . pantoprazole (PROTONIX) 40 MG tablet TAKE 1 TABLET EVERY DAY 90 tablet 10  . spironolactone (ALDACTONE) 50 MG tablet Take 1 tablet (50 mg total) by mouth daily. 90 tablet 5  . tiZANidine (ZANAFLEX) 4 MG tablet TAKE 1 TABLET EVERY 6 HOURS AS NEEDED FOR MUSCLE SPASM(S) 270 tablet 10  . tolterodine (DETROL LA) 4 MG 24 hr capsule TAKE 1 CAPSULE TWICE DAILY 180 capsule 10  . trimethoprim-polymyxin b (POLYTRIM) ophthalmic solution PLACE 1 DROP  INTO THE LEFT EYE EVERY 6 HOURS. 10 mL 0  . mupirocin ointment (BACTROBAN) 2 % APPLY TO AFFECTED AREA TWICE A DAY 22 g 0  . amoxicillin (AMOXIL) 500 MG capsule TAKE 4 CAPSULES BY MOUTH 1 HOUR PRIOR TO DENTAL PROCEDURE (Patient not taking: Reported on 04/29/2022) 4 capsule 2  . amoxicillin-clavulanate (AUGMENTIN) 875-125 MG tablet Take 1 tablet by mouth 2 (two) times daily. (Patient not taking: Reported on 04/29/2022) 14 tablet 0  . benzonatate (TESSALON) 200 MG capsule Take 1 capsule (200 mg total) by mouth 3 (three) times daily as needed for cough. (Patient not taking: Reported on 04/29/2022) 60 capsule 1   No facility-administered medications prior to visit.    Review of Systems;  Patient denies headache, fevers, malaise, unintentional weight loss, skin rash, eye pain, sinus congestion and sinus pain, sore throat, dysphagia,  hemoptysis , cough, dyspnea, wheezing, chest pain, palpitations, orthopnea, edema, abdominal pain, nausea, melena, diarrhea, constipation, flank pain, dysuria, hematuria, urinary  Frequency, nocturia, numbness, tingling, seizures,  Focal weakness, Loss of consciousness,  Tremor, insomnia, depression, anxiety, and suicidal ideation.      Objective:  BP 138/80   Pulse (!) 131   Temp (!) 97.5 F (36.4 C) (Oral)   Ht 5\' 5"  (1.651 m)   Wt 300 lb (136.1 kg)   SpO2 96%   BMI 49.92 kg/m   BP Readings from Last 3 Encounters:  04/29/22 138/80  02/20/22 128/78  01/25/22 134/70    Wt Readings from Last 3 Encounters:  04/29/22 300 lb (136.1 kg)  03/22/22 (!) 330 lb (149.7 kg)  02/20/22 (!) 330 lb (149.7 kg)    Physical Exam Vitals reviewed.  Constitutional:      General: She is not in acute distress.    Appearance: Normal appearance. She is normal weight. She is not ill-appearing, toxic-appearing or diaphoretic.  HENT:     Head: Normocephalic.  Eyes:     General: No scleral icterus.       Right eye: No discharge.        Left eye: No discharge.      Conjunctiva/sclera: Conjunctivae normal.  Cardiovascular:     Rate and Rhythm: Normal rate and regular rhythm.     Heart sounds: Normal heart sounds.  Pulmonary:     Effort: Pulmonary effort is normal. No respiratory distress.     Breath sounds: Normal breath sounds.  Musculoskeletal:        General: Normal range of motion.  Skin:    General: Skin is warm and dry.  Neurological:     General: No focal deficit present.     Mental Status: She is alert and oriented to person, place, and time. Mental status is at baseline.  Psychiatric:        Mood and Affect: Mood normal.        Behavior: Behavior normal.        Thought Content: Thought content normal.        Judgment: Judgment normal.     Lab Results  Component Value Date   HGBA1C 7.3 (H) 04/25/2022   HGBA1C 7.3 (H) 01/22/2022   HGBA1C 7.2 (H) 10/23/2021    Lab Results  Component Value Date   CREATININE 0.92 04/25/2022   CREATININE 0.89 01/22/2022   CREATININE 0.93 10/23/2021    Lab Results  Component Value Date   WBC 8.4 06/10/2018   HGB 14.9 06/10/2018   HCT 45.1 06/10/2018   PLT 306.0 06/10/2018   GLUCOSE 163 (H) 04/25/2022   CHOL 245 (H) 04/25/2022   TRIG 199.0 (H) 04/25/2022   HDL 44.20 04/25/2022   LDLDIRECT 162.0 04/25/2022   LDLCALC 161 (H) 04/25/2022   ALT 15 04/25/2022   AST 20 04/25/2022   NA 134 (L) 04/25/2022   K 4.8 04/25/2022   CL 99 04/25/2022   CREATININE 0.92 04/25/2022   BUN 20 04/25/2022   CO2 24 04/25/2022   TSH 9.69 (H) 01/22/2022   INR 1.37 09/04/2016   HGBA1C 7.3 (H) 04/25/2022   MICROALBUR 14.6 (H) 04/20/2021    MM DIAG BREAST TOMO UNI LEFT  Result Date: 03/06/2022 CLINICAL DATA:  BI-RADS 3 follow-up of 2 adjacent LEFT breast masses favored to reflect intramammary lymph nodes. EXAM: DIGITAL DIAGNOSTIC UNILATERAL LEFT MAMMOGRAM WITH TOMOSYNTHESIS; ULTRASOUND LEFT BREAST LIMITED TECHNIQUE: Left digital diagnostic mammography and breast tomosynthesis was performed.; Targeted  ultrasound examination of the left breast was performed. Best images possible per technologist communication. COMPARISON:  Previous exam(s). ACR Breast Density Category a: The breast tissue is almost entirely fatty. FINDINGS: Diagnostic images LEFT breast demonstrate mammographic stability of 3 adjacent oval circumscribed masses in the LEFT upper outer breast at middle to posterior depth. The larger of the masses has been previously characterized as a benign intramammary lymph node. No new suspicious findings are noted in the LEFT breast. Targeted ultrasound was performed the LEFT upper outer breast. At 1 o'clock 8 cm from nipple, there is revisualization of an oval circumscribed mass. It measures 4 x 3 x 5 mm, previously 4 x 3 x 5 mm. Adjacent mass measures 3 x 4 x 2 mm, previously 3 x 4 x 2 mm. They demonstrate suggestion of internal echogenic hila. IMPRESSION: There are 2 stable probably benign masses likely reflecting benign intramammary lymph nodes. Recommend follow-up diagnostic mammogram (with ultrasound if deemed necessary) in 6 months. This will establish 1 year of definitive stability from baseline. RECOMMENDATION: Bilateral diagnostic mammogram (with RIGHT and LEFT breast ultrasound if deemed necessary) in 6 months. I have discussed the findings and recommendations with the patient. If applicable, a reminder letter will be sent to the patient regarding the next appointment. BI-RADS CATEGORY  3: Probably benign. Electronically Signed   By: Valentino Saxon M.D.   On: 03/06/2022 10:36  US BREAST LTD UNI LEFT INC AXILLA  Result Date: 03/06/2022 CLINICAL DATA:  BI-RADS 3 follow-up of 2 adjacent LEFT breast masses favored to reflect intramammary lymph nodes. EXAM: DIGITAL DIAGNOSTIC UNILATERAL LEFT MAMMOGRAM WITH TOMOSYNTHESIS; ULTRASOUND LEFT BREAST LIMITED TECHNIQUE: Left digital diagnostic mammography and breast tomosynthesis was performed.; Targeted ultrasound examination of the left breast was  performed. Best images possible per technologist communication. COMPARISON:  Previous exam(s). ACR Breast Density Category a: The breast tissue is almost  entirely fatty. FINDINGS: Diagnostic images LEFT breast demonstrate mammographic stability of 3 adjacent oval circumscribed masses in the LEFT upper outer breast at middle to posterior depth. The larger of the masses has been previously characterized as a benign intramammary lymph node. No new suspicious findings are noted in the LEFT breast. Targeted ultrasound was performed the LEFT upper outer breast. At 1 o'clock 8 cm from nipple, there is revisualization of an oval circumscribed mass. It measures 4 x 3 x 5 mm, previously 4 x 3 x 5 mm. Adjacent mass measures 3 x 4 x 2 mm, previously 3 x 4 x 2 mm. They demonstrate suggestion of internal echogenic hila. IMPRESSION: There are 2 stable probably benign masses likely reflecting benign intramammary lymph nodes. Recommend follow-up diagnostic mammogram (with ultrasound if deemed necessary) in 6 months. This will establish 1 year of definitive stability from baseline. RECOMMENDATION: Bilateral diagnostic mammogram (with RIGHT and LEFT breast ultrasound if deemed necessary) in 6 months. I have discussed the findings and recommendations with the patient. If applicable, a reminder letter will be sent to the patient regarding the next appointment. BI-RADS CATEGORY  3: Probably benign. Electronically Signed   By: Meda Klinefelter M.D.   On: 03/06/2022 10:36   Assessment & Plan:  .Essential hypertension, benign -     Comprehensive metabolic panel; Future  Hyperlipidemia associated with type 2 diabetes mellitus ([facility]) -     Lipid panel; Future -     LDL cholesterol, direct; Future  Diabetic nephropathy associated with type 2 diabetes mellitus ([facility]) -     Comprehensive metabolic panel; Future -     Hemoglobin A1c; Future  Acquired hypothyroidism -     TSH; Future  Other orders -     Mupirocin; APPLY TO  AFFECTED AREA TWICE A DAY  Dispense: 30 g; Refill: 0     I provided 30 minutes of face-to-face time during this encounter reviewing patient's last visit with me, patient's  most recent visit with cardiology,  nephrology,  and neurology,  recent surgical and non surgical procedures, previous  labs and imaging studies, counseling on currently addressed issues,  and post visit ordering to diagnostics and therapeutics .   Follow-up: No follow-ups on file.   Sherlene Shams, MD

## 2022-04-30 DIAGNOSIS — E538 Deficiency of other specified B group vitamins: Secondary | ICD-10-CM | POA: Diagnosis not present

## 2022-04-30 DIAGNOSIS — G629 Polyneuropathy, unspecified: Secondary | ICD-10-CM | POA: Diagnosis not present

## 2022-04-30 DIAGNOSIS — G5603 Carpal tunnel syndrome, bilateral upper limbs: Secondary | ICD-10-CM | POA: Diagnosis not present

## 2022-04-30 NOTE — Assessment & Plan Note (Signed)
Encouargedto resume walking

## 2022-04-30 NOTE — Assessment & Plan Note (Signed)
She has adequate control of diabetes with lantus only.  Given her deconditoning. Obesity, and  limited mobility,  adding [place] or [place] would increase her risk for adverse events.  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  No changes today .   Lab Results  Component Value Date   HGBA1C 7.3 (H) 04/25/2022   Lab Results  Component Value Date   MICROALBUR 14.6 (H) 04/20/2021   MICROALBUR 15.6 (H) 12/31/2019

## 2022-04-30 NOTE — Assessment & Plan Note (Signed)
Trial of imodium  Once daily in the morning ,  encouraged to keep a food diary

## 2022-04-30 NOTE — Assessment & Plan Note (Signed)
[place] because [place] was eating too many desserts that were included.  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

## 2022-05-06 DIAGNOSIS — G5603 Carpal tunnel syndrome, bilateral upper limbs: Secondary | ICD-10-CM | POA: Diagnosis not present

## 2022-05-08 ENCOUNTER — Other Ambulatory Visit: Payer: Self-pay | Admitting: Internal Medicine

## 2022-05-09 DIAGNOSIS — H26491 Other secondary cataract, right eye: Secondary | ICD-10-CM | POA: Diagnosis not present

## 2022-05-09 DIAGNOSIS — M3501 Sicca syndrome with keratoconjunctivitis: Secondary | ICD-10-CM | POA: Diagnosis not present

## 2022-05-09 DIAGNOSIS — E119 Type 2 diabetes mellitus without complications: Secondary | ICD-10-CM | POA: Diagnosis not present

## 2022-05-09 DIAGNOSIS — H40003 Preglaucoma, unspecified, bilateral: Secondary | ICD-10-CM | POA: Diagnosis not present

## 2022-05-09 DIAGNOSIS — Z0100 Encounter for examination of eyes and vision without abnormal findings: Secondary | ICD-10-CM | POA: Diagnosis not present

## 2022-05-13 DIAGNOSIS — R2681 Unsteadiness on feet: Secondary | ICD-10-CM | POA: Diagnosis not present

## 2022-05-13 DIAGNOSIS — G5603 Carpal tunnel syndrome, bilateral upper limbs: Secondary | ICD-10-CM | POA: Diagnosis not present

## 2022-05-20 ENCOUNTER — Ambulatory Visit: Payer: Medicare PPO | Attending: Internal Medicine

## 2022-05-20 DIAGNOSIS — M6281 Muscle weakness (generalized): Secondary | ICD-10-CM | POA: Diagnosis not present

## 2022-05-20 DIAGNOSIS — R202 Paresthesia of skin: Secondary | ICD-10-CM | POA: Insufficient documentation

## 2022-05-20 NOTE — Therapy (Unsigned)
OUTPATIENT OCCUPATIONAL THERAPY ORTHO EVALUATION  Patient Name: Bonnie Hinton MRN: [id number] DOB:06/09/1940, 82 y.o., female Today's Date: 05/20/2022  PCP: Dr. Deborra Medina REFERRING PROVIDER: Dr. Farrel Gordon END OF SESSION:  OT End of Session - 05/20/22 1737     Visit Number 1    Number of Visits 12    Date for OT Re-Evaluation 07/01/22    Progress Note Due on Visit 10    OT Start Time 0830    OT Stop Time 0934    OT Time Calculation (min) 64 min    Equipment Utilized During Treatment transport chair    Activity Tolerance Patient tolerated treatment well    Behavior During Therapy WFL for tasks assessed/performed             Past Medical History:  Diagnosis Date   Anxiety    Arthritis    Choledocholithiasis    Chronic back pain    stenosis and arthritis   Community acquired pneumonia [id number]   Complication of anesthesia    anxious when she wakes up   GERD (gastroesophageal reflux disease)    takes Pantoprazole daily   Glaucoma    dry angle   History of bronchitis    30+ yrs ago   History of MRSA infection 2004   several yrs ago   Hyperlipidemia    takes Zetia and Crestor daily   Hypertension    take Amlodipine daily   Hypothyroidism    takes Synthroid daily   Joint pain    Joint swelling    Neuropathy    takes Gabapentin daily   Pancreatitis, gallstone    Peripheral edema    takes Furosemide daily as needed   Pneumonia    hx of-30 + yrs ago   Pre-diabetes    takes Victoza daily   Primary localized osteoarthritis of right knee    Subdural hematoma ([place]) 8 yrs ago   hx of   Urinary frequency    Urinary urgency    Past Surgical History:  Procedure Laterality Date   ABDOMINAL HYSTERECTOMY     CHOLECYSTECTOMY N/A 09/09/2016   Procedure: LAPAROSCOPIC CHOLECYSTECTOMY;  Surgeon: Donnie Mesa, MD;  Location: [facility];  Service: General;  Laterality: N/A;   COLONOSCOPY     RADIOLOGY WITH ANESTHESIA N/A 08/22/2015   Procedure: MRI OF  LUMBAR SPINE   (RADIOLOGY WITH ANESTHESIA);  Surgeon: Medication Radiologist, MD;  Location: [facility];  Service: Radiology;  Laterality: N/A;   TOTAL HIP ARTHROPLASTY Right    TOTAL HIP ARTHROPLASTY Left    TOTAL KNEE ARTHROPLASTY Left    TOTAL KNEE ARTHROPLASTY Right 01/01/2016   Procedure: TOTAL KNEE ARTHROPLASTY;  Surgeon: Elsie Saas, MD;  Location: [facility];  Service: Orthopedics;  Laterality: Right;   Patient Active Problem List   Diagnosis Date Noted   Hordeolum externum of left upper eyelid 02/20/2022   Polyarthritis 10/26/2021   Foot deformity, acquired, left 10/25/2021   Rash 10/25/2021   Osteopenia after menopause 10/25/2021   Pitting edema 10/25/2021   Mass of breast, left 08/08/2021   Walker as ambulation aid 07/23/2021   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   Numbness and tingling in right hand 04/25/2021   Mild protein-calorie malnutrition ([place]) 08/16/2020   OA (osteoarthritis) of finger, right 04/05/2020   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   Candidiasis of skin 06/28/2017   IBS (irritable bowel syndrome) 06/28/2017   Screening for colon cancer 12/28/2016   Dizziness 05/24/2016   Anxiety, generalized 02/20/2016   Arthritis  Acquired hypothyroidism    History of CVA (cerebrovascular accident)    Status post bilateral hip replacements    Status post right knee replacement    Degenerative lumbar spinal stenosis    Essential hypertension, benign 07/25/2015   Preoperative clearance 07/25/2015   Hyperlipidemia associated with type 2 diabetes mellitus ([place]) 07/25/2015   Mixed stress and urge urinary incontinence 04/26/2015   Drug-induced hyperkalemia 04/26/2015   Diabetic nephropathy associated with type 2 diabetes mellitus ([place]) 01/24/2015   Morbid obesity ([place]) 01/24/2015   Vitamin D deficiency 01/24/2015   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 Pt accompanied by: self  PERTINENT HISTORY: Per chart: [facility] 1. Bilateral hand paresthesias, carpal tunnel syndrome + generalized sensory motor peripheral polyneuropathy in a patient with severe Vitamin B12 deficiency + arthritis   We will set up Occupational Therapy for hands.  We reviewed patient's neuroimaging report and actual images with patient. X-ray cervical spine 01/29/2022: Severe arthritic changes at C6-7 and moderate a level   MRI C-Spine without contrast 02/11/2022: At C3-4 there is a mild broad-based disc bulge. Mild right facet arthropathy and right uncovertebral degenerative changes. Moderate right foraminal stenosis. At C4-5 there is a mild broad-based disc bulge. Bilateral uncovertebral degenerative changes. Moderate bilateral facet arthropathy. Moderate-severe right foraminal stenosis. Mild-moderate left foraminal stenosis. At C6-7 there is a broad-based disc bulge. Small right paracentral disc protrusion. Left uncovertebral degenerative changes. Severe left foraminal stenosis. No acute osseous injury of the cervical spine.   NCS upper extremity 04/17/2022: This is an abnormal electrodiagnostic study consistent with a 1) generalized severe sensorimotor polyneuropathy. 2) Can't rule out component of superimposed carpal tunnel syndrome.   PRECAUTIONS: None  WEIGHT BEARING RESTRICTIONS: No  PAIN:  Are you having pain? Yes: NPRS scale: 6 at rest, 7-8 at night time/10 Pain location: bilat hands Pain description: stiff Aggravating factors: night time Relieving factors: nothing   FALLS: Has patient fallen in last 6 months? No  LIVING ENVIRONMENT: Lives with: lives alone Lives in: 1 level house  Stairs: none Has following  equipment at home: Gilford Rile - 4 wheeled, Wheelchair (manual), shower chair, Grab bars, and grab bars in shower and by toilet  PLOF: Independent, daughter lives in [place] about 20 min away  PATIENT GOALS: "I'd like to get my hands back to where they're not hurting."   NEXT MD VISIT:   OBJECTIVE:   HAND DOMINANCE: Right  ADLs: Overall ADLs: modified indep/extra time, mostly from wc level Transfers/ambulation related to ADLs: pt reports she mostly uses the wc at home; pt is trying to get back to using her walker and reports that she gets up with the walker every hour to walk a little  Eating: able to cut food  Grooming: indep UB Dressing: pt reports she does not wear a bra because she can't hook it  LB Dressing: wears elastic waist pants to avoid buttons/zippers; pt ties shoes loosely so she can slip them on Toileting: modified indep Bathing: modified indep Tub Shower transfers: modified indep  Equipment:  see above   FUNCTIONAL OUTCOME MEASURES: FOTO: 63; predicted 66  UPPER EXTREMITY ROM:  Active ROM Right eval Left eval  Elbow flexion    Elbow extension    Wrist flexion 77 81  Wrist extension 46 58  Wrist ulnar deviation 52 40  Wrist radial deviation 11 22  Wrist pronation 90 90  Wrist supination 24 42  (Blank rows = not tested)  * Able to make full composite fist each hand and oppose all digits to thumb.   UPPER EXTREMITY MMT:     MMT Right eval Left eval  Elbow flexion    Elbow extension    Wrist flexion 4+ 4+  Wrist extension 4+ 4+  Wrist ulnar deviation    Wrist radial deviation    Wrist pronation    Wrist supination    (Blank rows = not tested)  HAND FUNCTION: Grip strength: Right: 37 lbs; Left: 40 lbs, Lateral pinch: Right: 18 lbs, Left: 9 lbs, 3 point pinch: Right: 21 lbs, Left: 17 lbs, and Tip pinch: Right 12 lbs, Left: 11 lbs  COORDINATION: 9 Hole Peg test: Right: 38 sec; Left: 24 sec  SENSATION: Light touch: Impaired , bilat hands  along median nerve distribution (pt reports worst pain and numbness is in her IF and LF of each hand)  EDEMA: Enlarged joints of the fingers r/t RA  COGNITION: Overall cognitive status: Within functional limits for tasks assessed  OBSERVATIONS: +Tinel's bilaterally, + Phalen's bilaterally  TODAY'S TREATMENT:                                                                                                                              DATE: 05/20/22: Evaluation completed.  Objective measures taken and goals established for poc.  Recommendation made for pt to obtain bilat carpal tunnel wrist braces and wear at night time to start.  Advised on options to obtain with recommended size L to XL if available.   PATIENT EDUCATION: Education details: OT role, goals, poc, carpal tunnel braces Person educated: Patient Education method: Explanation Education comprehension: verbalized understanding and needs further education  HOME EXERCISE PROGRAM: To be initiated in the next 1-2 sessions  GOALS: Goals reviewed with patient? Yes  SHORT TERM GOALS: Target date: 06/10/22 (3 weeks)  Pt will be indep to perform HEP for improving bilat hand flexibility, strength, and coordination. Baseline: Eval: not yet initiated Goal status: INITIAL   2.  Pt will be indep to verbalize and demo 2-3 joint protection strategies and/or pieces of AE to reduce strain in hands during daily tasks.  Baseline: Eval: Educated not yet initiated Goal status: INITIAL     LONG TERM GOALS: Target date: 07/01/22 (6 weeks)   Pt will increase FOTO score to 66 or better to indicate clinically relevant self perceived improvement in performance of daily tasks.  Baseline: Eval: 63; predicted 66 Goal status: INITIAL    2.  Pt will increase bilat grip strength by 5 or more lbs to improve ability to securely hold a grocery bag in either hand. Baseline:  Eval: R 37#, L 40# Goal status: INITIAL   3.  Pt will tolerate manual therapy,  therapeutic modalities, splinting, and/or exercises to decrease pain in bilat hands to a reported 3/10 pain or less with activity or at night time.. Baseline: Eval: 7-8/10 pain in bilat hands with activity or night time Goal status: INITIAL    4.  Pt will improve FMC/dexterity skills in bilat hands to be able to manage anterior clothing fasteners. Baseline: Eval: R 9 hole: 38 sec; L 24 sec; pt avoids clothing with fasteners Goal status: INITIAL  ASSESSMENT:  CLINICAL IMPRESSION: Patient is a 82 y.o. female who was seen today for occupational therapy evaluation for bilat carpal tunnel syndrome with paresthesias in bilat hands.  Clarification provided regarding OT role as pt thought she was present today for therapy on her neck, but pt did agree to OT evaluation and did endorse deficits in her hands which are impacting her ADLs.  Pt also in agreement for OT to request PT referral for cervical issues noted in chart.  Pt reports that one hand is not worse than the other regarding symptoms, but R dominant has is slightly weaker.  Pt reports the pain in her hands is most severe at night time.  Pt states she's tried injections in her hands without improvement in her pain or numbness.  Pt with hx of obesity, severe arthritic changes at C6-7, RA with arthritic deformities in hands, but is able to make a full composite fist and oppose each digit to thumb in bilat hands.  Pt reports that she avoids clothing with fasteners as she can't manage them.  Pt's goal is to reduce pain.  Pt will benefit from skilled OT for instruction in HEP for improving strength, coordination, and flexibility in bilat hands, to provide education on joint protection/cumulative trauma prevention strategies, ADL and AE training, and splinting/modalities as needed for pain management in bilat hands.  Pt in agreement with poc.   PERFORMANCE DEFICITS: in functional skills including ADLs, IADLs, coordination, dexterity, sensation, ROM, strength,  pain, flexibility, Fine motor control, body mechanics, decreased knowledge of precautions, decreased knowledge of use of DME, and UE functional use, and psychosocial skills including environmental adaptation, habits, and routines and behaviors.   IMPAIRMENTS: are limiting patient from ADLs, IADLs, and rest and sleep.   COMORBIDITIES: has co-morbidities such as RA, obesity  that affects occupational performance. Patient will benefit from skilled OT to address above impairments and improve overall function.  MODIFICATION OR ASSISTANCE TO COMPLETE EVALUATION: No modification of tasks or assist necessary to complete an evaluation.  OT OCCUPATIONAL PROFILE AND HISTORY: Problem focused assessment: Including review of records relating to presenting problem.  CLINICAL DECISION MAKING: Moderate - several treatment options, min-mod task modification necessary  REHAB POTENTIAL: Fair ; (lives alone, multiple co morbidities )  EVALUATION COMPLEXITY: Moderate      PLAN:  OT FREQUENCY: 2x/week  OT DURATION: 6 weeks  PLANNED INTERVENTIONS: self care/ADL training, therapeutic exercise, therapeutic activity, manual therapy, passive range of motion, splinting, iontophoresis, paraffin, moist heat, cryotherapy, contrast bath, patient/family education, and DME and/or AE instructions  RECOMMENDED OTHER SERVICES: PT referral to address deficits related to cervical changes, decreased mobility  CONSULTED AND AGREED WITH PLAN OF CARE: Patient  PLAN FOR NEXT SESSION: see plan  Leta Speller, MS, OTR/L  Darleene Cleaver, OT 05/20/2022, 5:41 PM

## 2022-05-21 ENCOUNTER — Ambulatory Visit: Payer: Medicare PPO

## 2022-05-21 DIAGNOSIS — M6281 Muscle weakness (generalized): Secondary | ICD-10-CM

## 2022-05-21 DIAGNOSIS — R202 Paresthesia of skin: Secondary | ICD-10-CM

## 2022-05-21 NOTE — Therapy (Signed)
OUTPATIENT OCCUPATIONAL THERAPY ORTHO TREATMENT  Patient Name: Bonnie Hinton MRN: [id number] DOB:19-May-1940, 82 y.o., female Today's Date: 05/21/2022  PCP: Dr. Deborra Medina REFERRING PROVIDER: Dr. Farrel Gordon END OF SESSION:  OT End of Session - 05/21/22 1530     Visit Number 2    Number of Visits 12    Date for OT Re-Evaluation 07/01/22    Progress Note Due on Visit 10    OT Start Time 1015    OT Stop Time 1100    OT Time Calculation (min) 45 min    Equipment Utilized During Treatment transport chair    Activity Tolerance Patient tolerated treatment well    Behavior During Therapy WFL for tasks assessed/performed             Past Medical History:  Diagnosis Date   Anxiety    Arthritis    Choledocholithiasis    Chronic back pain    stenosis and arthritis   Community acquired pneumonia [id number]   Complication of anesthesia    anxious when she wakes up   GERD (gastroesophageal reflux disease)    takes Pantoprazole daily   Glaucoma    dry angle   History of bronchitis    30+ yrs ago   History of MRSA infection 2004   several yrs ago   Hyperlipidemia    takes Zetia and Crestor daily   Hypertension    take Amlodipine daily   Hypothyroidism    takes Synthroid daily   Joint pain    Joint swelling    Neuropathy    takes Gabapentin daily   Pancreatitis, gallstone    Peripheral edema    takes Furosemide daily as needed   Pneumonia    hx of-30 + yrs ago   Pre-diabetes    takes Victoza daily   Primary localized osteoarthritis of right knee    Subdural hematoma ([place]) 8 yrs ago   hx of   Urinary frequency    Urinary urgency    Past Surgical History:  Procedure Laterality Date   ABDOMINAL HYSTERECTOMY     CHOLECYSTECTOMY N/A 09/09/2016   Procedure: LAPAROSCOPIC CHOLECYSTECTOMY;  Surgeon: Donnie Mesa, MD;  Location: [facility];  Service: General;  Laterality: N/A;   COLONOSCOPY     RADIOLOGY WITH ANESTHESIA N/A 08/22/2015   Procedure: MRI OF  LUMBAR SPINE   (RADIOLOGY WITH ANESTHESIA);  Surgeon: Medication Radiologist, MD;  Location: [facility];  Service: Radiology;  Laterality: N/A;   TOTAL HIP ARTHROPLASTY Right    TOTAL HIP ARTHROPLASTY Left    TOTAL KNEE ARTHROPLASTY Left    TOTAL KNEE ARTHROPLASTY Right 01/01/2016   Procedure: TOTAL KNEE ARTHROPLASTY;  Surgeon: Elsie Saas, MD;  Location: [facility];  Service: Orthopedics;  Laterality: Right;   Patient Active Problem List   Diagnosis Date Noted   Hordeolum externum of left upper eyelid 02/20/2022   Polyarthritis 10/26/2021   Foot deformity, acquired, left 10/25/2021   Rash 10/25/2021   Osteopenia after menopause 10/25/2021   Pitting edema 10/25/2021   Mass of breast, left 08/08/2021   Walker as ambulation aid 07/23/2021   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   Numbness and tingling in right hand 04/25/2021   Mild protein-calorie malnutrition ([place]) 08/16/2020   OA (osteoarthritis) of finger, right 04/05/2020   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   Candidiasis of skin 06/28/2017   IBS (irritable bowel syndrome) 06/28/2017   Screening for colon cancer 12/28/2016   Dizziness 05/24/2016   Anxiety, generalized 02/20/2016   Arthritis  Acquired hypothyroidism    History of CVA (cerebrovascular accident)    Status post bilateral hip replacements    Status post right knee replacement    Degenerative lumbar spinal stenosis    Essential hypertension, benign 07/25/2015   Preoperative clearance 07/25/2015   Hyperlipidemia associated with type 2 diabetes mellitus ([place]) 07/25/2015   Mixed stress and urge urinary incontinence 04/26/2015   Drug-induced hyperkalemia 04/26/2015   Diabetic nephropathy associated with type 2 diabetes mellitus ([place]) 01/24/2015   Morbid obesity ([place]) 01/24/2015   Vitamin D deficiency 01/24/2015   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 Pt accompanied by: self  PERTINENT HISTORY: Per chart: [facility] 1. Bilateral hand paresthesias, carpal tunnel syndrome + generalized sensory motor peripheral polyneuropathy in a patient with severe Vitamin B12 deficiency + arthritis   We will set up Occupational Therapy for hands.  We reviewed patient's neuroimaging report and actual images with patient. X-ray cervical spine 01/29/2022: Severe arthritic changes at C6-7 and moderate a level   MRI C-Spine without contrast 02/11/2022: At C3-4 there is a mild broad-based disc bulge. Mild right facet arthropathy and right uncovertebral degenerative changes. Moderate right foraminal stenosis. At C4-5 there is a mild broad-based disc bulge. Bilateral uncovertebral degenerative changes. Moderate bilateral facet arthropathy. Moderate-severe right foraminal stenosis. Mild-moderate left foraminal stenosis. At C6-7 there is a broad-based disc bulge. Small right paracentral disc protrusion. Left uncovertebral degenerative changes. Severe left foraminal stenosis. No acute osseous injury of the cervical spine.   NCS upper extremity 04/17/2022: This is an abnormal electrodiagnostic study consistent with a 1) generalized severe sensorimotor polyneuropathy. 2) Can't rule out component of superimposed carpal tunnel syndrome.   PRECAUTIONS: None  WEIGHT BEARING RESTRICTIONS: No  PAIN:  Are you having pain? Yes: NPRS scale: 6 at rest, 7-8 at night time/10 Pain location: bilat hands Pain description: stiff Aggravating factors: night time Relieving factors: nothing   FALLS: Has patient fallen in last 6 months? No  LIVING ENVIRONMENT: Lives  with: lives alone Lives in: 1 level house  Stairs: none Has following equipment at home: Gilford Rile - 4 wheeled, Wheelchair (manual), shower chair, Grab bars, and grab bars in shower and by toilet  PLOF: Independent, daughter lives in [place] about 20 min away  PATIENT GOALS: "I'd like to get my hands back to where they're not hurting."   NEXT MD VISIT:   OBJECTIVE:   HAND DOMINANCE: Right  ADLs: Overall ADLs: modified indep/extra time, mostly from wc level Transfers/ambulation related to ADLs: pt reports she mostly uses the wc at home; pt is trying to get back to using her walker and reports that she gets up with the walker every hour to walk a little  Eating: able to cut food  Grooming: indep UB Dressing: pt reports she does not wear a bra because she can't hook it  LB Dressing: wears elastic waist pants to avoid buttons/zippers; pt ties shoes loosely so she can slip them on Toileting: modified indep Bathing: modified indep Tub Shower transfers: modified indep  Equipment:  see above   FUNCTIONAL OUTCOME MEASURES:  FOTO: 63; predicted 66  UPPER EXTREMITY ROM:     Active ROM Right eval Left eval  Elbow flexion    Elbow extension    Wrist flexion 77 81  Wrist extension 46 58  Wrist ulnar deviation 52 40  Wrist radial deviation 11 22  Wrist pronation 90 90  Wrist supination 24 42  (Blank rows = not tested)  * Able to make full composite fist each hand and oppose all digits to thumb.   UPPER EXTREMITY MMT:     MMT Right eval Left eval  Elbow flexion    Elbow extension    Wrist flexion 4+ 4+  Wrist extension 4+ 4+  Wrist ulnar deviation    Wrist radial deviation    Wrist pronation    Wrist supination    (Blank rows = not tested)  HAND FUNCTION: Grip strength: Right: 37 lbs; Left: 40 lbs, Lateral pinch: Right: 18 lbs, Left: 9 lbs, 3 point pinch: Right: 21 lbs, Left: 17 lbs, and Tip pinch: Right 12 lbs, Left: 11 lbs  COORDINATION: 9 Hole Peg test: Right: 38  sec; Left: 24 sec  SENSATION: Light touch: Impaired , bilat hands along median nerve distribution (pt reports worst pain and numbness is in her IF and LF of each hand)  EDEMA: Enlarged joints of the fingers r/t RA  COGNITION: Overall cognitive status: Within functional limits for tasks assessed  OBSERVATIONS: +Tinel's bilaterally, + Phalen's bilaterally  TODAY'S TREATMENT:                                                                                                                              DATE: 05/21/22: Moist heat applied to bilat hands x5 min for muscle relaxation/pain reduction in prep for therapeutic exercises.  Therapeutic Exercise: Issued light resistant (turquoise) theraputty and instructed pt in strengthening and coordination exercises for R/L hands, including gross grasping, lateral/2 point/3 point pinching, digit abd/add, and digging coins out of putty.  Able to return demo with intermittent vc for technique to improve quality of movement.  Encouraged completion 5-10 min, 1-3x per day.  Handout provided for carryover and will continue to review.  Instructed pt in proximal median nerve glide for R/L Ues.  Pt required mod A to formulate correct movements and form.    PATIENT EDUCATION: Education details: advised on splinting schedule when braces are obtained (begin wearing 1-2 at night time, as tolerated during the day) Person educated: Patient Education method: Explanation Education comprehension: verbalized understanding and needs further education  HOME EXERCISE PROGRAM: Turquoise theraputty  GOALS: Goals reviewed with patient? Yes  SHORT TERM GOALS: Target date: 06/10/22 (3 weeks)  Pt will be indep to perform HEP for improving bilat hand flexibility, strength, and coordination. Baseline: Eval: not yet initiated Goal status: INITIAL   2.  Pt will be indep to verbalize and demo 2-3 joint protection strategies and/or pieces of AE to reduce strain in hands during  daily tasks.  Baseline: Eval: Educated not yet initiated Goal status: INITIAL     LONG TERM GOALS: Target date: 07/01/22 (6 weeks)   Pt will increase FOTO score to 66 or better to indicate clinically relevant self perceived improvement in performance of daily tasks.  Baseline: Eval: 63; predicted 66 Goal status: INITIAL    2.  Pt will increase bilat grip strength by 5 or more lbs to improve ability to securely hold a grocery bag in either hand. Baseline: Eval: R 37#, L 40# Goal status: INITIAL   3.  Pt will tolerate manual therapy, therapeutic modalities, splinting, and/or exercises to decrease pain in bilat hands to a reported 3/10 pain or less with activity or at night time.. Baseline: Eval: 7-8/10 pain in bilat hands with activity or night time Goal status: INITIAL    4.  Pt will improve FMC/dexterity skills in bilat hands to be able to manage anterior clothing fasteners. Baseline: Eval: R 9 hole: 38 sec; L 24 sec; pt avoids clothing with fasteners Goal status: INITIAL  ASSESSMENT:  CLINICAL IMPRESSION: Pt tolerated moist heat well this date.  Good tolerance to theraputty exercises using light resistance turquoise putty; mod vc and demos for technique.  Handout given for carryover.  Initiated proximal median nerve glides with pt requiring mod A to facilitate proper form.  Pt limited in elbow extension with shoulder abd on R/L Ues, but benefited from OT passively moving pt through the positions of the proximal median nerve glide in order to achieve end range stretch.  Pt requires further training with median nerve glides before attempting at home on her own, so will continue to review and perform these during tx sessions.  Pt will benefit from continued skilled OT for instruction in HEP progression for improving strength, coordination, and flexibility in bilat hands, to provide education on joint protection/cumulative trauma prevention strategies, ADL and AE training, and  splinting/modalities as needed for pain management in bilat hands.  Pt in agreement with poc.   PERFORMANCE DEFICITS: in functional skills including ADLs, IADLs, coordination, dexterity, sensation, ROM, strength, pain, flexibility, Fine motor control, body mechanics, decreased knowledge of precautions, decreased knowledge of use of DME, and UE functional use, and psychosocial skills including environmental adaptation, habits, and routines and behaviors.   IMPAIRMENTS: are limiting patient from ADLs, IADLs, and rest and sleep.   COMORBIDITIES: has co-morbidities such as RA, obesity  that affects occupational performance. Patient will benefit from skilled OT to address above impairments and improve overall function.  MODIFICATION OR ASSISTANCE TO COMPLETE EVALUATION: No modification of tasks or assist necessary to complete an evaluation.  OT OCCUPATIONAL PROFILE AND HISTORY: Problem focused assessment: Including review of records relating to presenting problem.  CLINICAL DECISION MAKING: Moderate - several treatment options, min-mod task modification necessary  REHAB POTENTIAL: Fair ; (lives alone, multiple co morbidities )  EVALUATION COMPLEXITY: Moderate      PLAN:  OT FREQUENCY: 2x/week  OT DURATION: 6 weeks  PLANNED INTERVENTIONS: self care/ADL training, therapeutic exercise, therapeutic activity, manual therapy, passive range of motion, splinting, iontophoresis, paraffin, moist heat, cryotherapy, contrast bath, patient/family education, and DME and/or AE instructions  RECOMMENDED OTHER SERVICES: PT referral to address deficits related to cervical changes, decreased mobility  CONSULTED AND AGREED WITH PLAN OF CARE: Patient  PLAN FOR NEXT SESSION: see plan  Leta Speller, MS, OTR/L  Darleene Cleaver, OT 05/21/2022, 3:32 PM

## 2022-05-23 ENCOUNTER — Ambulatory Visit: Payer: Medicare PPO

## 2022-05-23 ENCOUNTER — Encounter: Payer: Self-pay | Admitting: Podiatry

## 2022-05-23 ENCOUNTER — Ambulatory Visit: Payer: Medicare PPO | Admitting: Podiatry

## 2022-05-23 VITALS — BP 143/90 | HR 111

## 2022-05-23 DIAGNOSIS — B351 Tinea unguium: Secondary | ICD-10-CM

## 2022-05-23 DIAGNOSIS — E1142 Type 2 diabetes mellitus with diabetic polyneuropathy: Secondary | ICD-10-CM

## 2022-05-23 DIAGNOSIS — M79676 Pain in unspecified toe(s): Secondary | ICD-10-CM | POA: Diagnosis not present

## 2022-05-23 NOTE — Progress Notes (Signed)
This patient returns to my office for at risk foot care.  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  This patient is unable to cut nails herself since the patient cannot reach her  nails.These nails are painful walking and wearing shoes.  This patient presents for at risk foot care today.  General Appearance  Alert, conversant and in no acute stress.  Vascular  Dorsalis pedis and posterior tibial  pulses are not palpable due to swelling  bilaterally.  Capillary return is within normal limits  bilaterally. Temperature is within normal limits  bilaterally.  Neurologic  Senn-Weinstein monofilament wire test within normal limits  bilaterally. Muscle power within normal limits bilaterally.  Nails Thick disfigured discolored nails with subungual debris  from hallux to fifth toes bilaterally. No evidence of bacterial infection or drainage bilaterally.  Orthopedic  No limitations of motion  feet .  No crepitus or effusions noted.  Contracted digits  B/L.  Severe PTTD left foot.   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  Hammer toes 2,3 right foot.  Skin  normotropic skin with no porokeratosis noted bilaterally.  No signs of infections or ulcers noted.     Onychomycosis  Pain in right toes  Pain in left toes  Consent was obtained for treatment procedures.   Mechanical debridement of nails 1-5  bilaterally performed with a nail nipper.  Filed with dremel without incident. No infection or ulcer.     Return office visit 3 months         Told patient to return for periodic foot care and evaluation due to potential at risk complications.   Gardiner Barefoot DPM

## 2022-05-27 ENCOUNTER — Other Ambulatory Visit: Payer: Self-pay | Admitting: Internal Medicine

## 2022-05-27 ENCOUNTER — Ambulatory Visit: Payer: Medicare PPO | Attending: Internal Medicine

## 2022-05-27 DIAGNOSIS — M6281 Muscle weakness (generalized): Secondary | ICD-10-CM | POA: Diagnosis not present

## 2022-05-27 DIAGNOSIS — R202 Paresthesia of skin: Secondary | ICD-10-CM | POA: Insufficient documentation

## 2022-05-27 NOTE — Therapy (Signed)
OUTPATIENT OCCUPATIONAL THERAPY ORTHO TREATMENT  Patient Name: Bonnie Hinton MRN: [id number] DOB:[birth year], 82 y.o., female Today's Date: 05/27/2022  PCP: Dr. Deborra Medina REFERRING PROVIDER: Dr. Farrel Gordon END OF SESSION:  OT End of Session - 05/27/22 0847     Visit Number 3    Number of Visits 12    Date for OT Re-Evaluation 07/01/22    Progress Note Due on Visit 10    OT Start Time 0845    OT Stop Time 0930    OT Time Calculation (min) 45 min    Equipment Utilized During Treatment transport chair    Activity Tolerance Patient tolerated treatment well    Behavior During Therapy WFL for tasks assessed/performed             Past Medical History:  Diagnosis Date   Anxiety    Arthritis    Choledocholithiasis    Chronic back pain    stenosis and arthritis   Community acquired pneumonia [id number]   Complication of anesthesia    anxious when she wakes up   GERD (gastroesophageal reflux disease)    takes Pantoprazole daily   Glaucoma    dry angle   History of bronchitis    30+ yrs ago   History of MRSA infection 2004   several yrs ago   Hyperlipidemia    takes Zetia and Crestor daily   Hypertension    take Amlodipine daily   Hypothyroidism    takes Synthroid daily   Joint pain    Joint swelling    Neuropathy    takes Gabapentin daily   Pancreatitis, gallstone    Peripheral edema    takes Furosemide daily as needed   Pneumonia    hx of-30 + yrs ago   Pre-diabetes    takes Victoza daily   Primary localized osteoarthritis of right knee    Subdural hematoma ([place]) 8 yrs ago   hx of   Urinary frequency    Urinary urgency    Past Surgical History:  Procedure Laterality Date   ABDOMINAL HYSTERECTOMY     CHOLECYSTECTOMY N/A 09/09/2016   Procedure: LAPAROSCOPIC CHOLECYSTECTOMY;  Surgeon: Donnie Mesa, MD;  Location: [facility];  Service: General;  Laterality: N/A;   COLONOSCOPY     RADIOLOGY WITH ANESTHESIA N/A 08/22/2015   Procedure: MRI OF  LUMBAR SPINE   (RADIOLOGY WITH ANESTHESIA);  Surgeon: Medication Radiologist, MD;  Location: [facility];  Service: Radiology;  Laterality: N/A;   TOTAL HIP ARTHROPLASTY Right    TOTAL HIP ARTHROPLASTY Left    TOTAL KNEE ARTHROPLASTY Left    TOTAL KNEE ARTHROPLASTY Right 01/01/2016   Procedure: TOTAL KNEE ARTHROPLASTY;  Surgeon: Elsie Saas, MD;  Location: [facility];  Service: Orthopedics;  Laterality: Right;   Patient Active Problem List   Diagnosis Date Noted   Hordeolum externum of left upper eyelid 02/20/2022   Polyarthritis 10/26/2021   Foot deformity, acquired, left 10/25/2021   Rash 10/25/2021   Osteopenia after menopause 10/25/2021   Pitting edema 10/25/2021   Mass of breast, left 08/08/2021   Walker as ambulation aid 07/23/2021   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   Numbness and tingling in right hand 04/25/2021   Mild protein-calorie malnutrition ([place]) 08/16/2020   OA (osteoarthritis) of finger, right 04/05/2020   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   Candidiasis of skin 06/28/2017   IBS (irritable bowel syndrome) 06/28/2017   Screening for colon cancer 12/28/2016   Dizziness 05/24/2016   Anxiety, generalized 02/20/2016   Arthritis  Acquired hypothyroidism    History of CVA (cerebrovascular accident)    Status post bilateral hip replacements    Status post right knee replacement    Degenerative lumbar spinal stenosis    Essential hypertension, benign 07/25/2015   Preoperative clearance 07/25/2015   Hyperlipidemia associated with type 2 diabetes mellitus ([place]) 07/25/2015   Mixed stress and urge urinary incontinence 04/26/2015   Drug-induced hyperkalemia 04/26/2015   Diabetic nephropathy associated with type 2 diabetes mellitus ([place]) 01/24/2015   Morbid obesity ([place]) 01/24/2015   Vitamin D deficiency 01/24/2015   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  OT encouraged pt to bring her braces in next session for OT to assess fit.  Encouraged pt loosen the velcro in the mean time.  Pt accompanied by: self  PERTINENT HISTORY: Per chart: [facility] 1. Bilateral hand paresthesias, carpal tunnel syndrome + generalized sensory motor peripheral polyneuropathy in a patient with severe Vitamin B12 deficiency + arthritis   We will set up Occupational Therapy for hands.  We reviewed patient's neuroimaging report and actual images with patient. X-ray cervical spine 01/29/2022: Severe arthritic changes at C6-7 and moderate a level   MRI C-Spine without contrast 02/11/2022: At C3-4 there is a mild broad-based disc bulge. Mild right facet arthropathy and right uncovertebral degenerative changes. Moderate right foraminal stenosis. At C4-5 there is a mild broad-based disc bulge. Bilateral uncovertebral degenerative changes. Moderate bilateral facet arthropathy. Moderate-severe right foraminal stenosis. Mild-moderate left foraminal stenosis. At C6-7 there is a broad-based disc bulge. Small right paracentral disc protrusion. Left uncovertebral degenerative changes. Severe left foraminal stenosis. No acute osseous injury of the cervical spine.   NCS upper extremity 04/17/2022: This is an abnormal electrodiagnostic study consistent with a 1) generalized severe sensorimotor polyneuropathy. 2) Can't rule out component of superimposed carpal tunnel syndrome.   PRECAUTIONS: None  WEIGHT BEARING RESTRICTIONS: No  PAIN:  Are you having pain? Yes: NPRS scale: 6 at rest, 7-8 at night time/10 Pain location: bilat hands Pain description: stiff Aggravating factors: night  time Relieving factors: nothing   FALLS: Has patient fallen in last 6 months? No  LIVING ENVIRONMENT: Lives with: lives alone Lives in: 1 level house  Stairs: none Has following equipment at home: Gilford Rile - 4 wheeled, Wheelchair (manual), shower chair, Grab bars, and grab bars in shower and by toilet  PLOF: Independent, daughter lives in [place] about 20 min away  PATIENT GOALS: "I'd like to get my hands back to where they're not hurting."   NEXT MD VISIT:   OBJECTIVE:   HAND DOMINANCE: Right  ADLs: Overall ADLs: modified indep/extra time, mostly from wc level Transfers/ambulation related to ADLs: pt reports she mostly uses the wc at home; pt is trying to get back to using her walker and reports that she gets up with the walker every hour to walk a little  Eating: able to cut food  Grooming: indep UB Dressing: pt reports she does not wear a bra because she can't hook it  LB Dressing: wears elastic waist pants to avoid buttons/zippers; pt ties shoes loosely so she  can slip them on Toileting: modified indep Bathing: modified indep Tub Shower transfers: modified indep  Equipment:  see above   FUNCTIONAL OUTCOME MEASURES: FOTO: 63; predicted 66  UPPER EXTREMITY ROM:     Active ROM Right eval Left eval  Elbow flexion    Elbow extension    Wrist flexion 77 81  Wrist extension 46 58  Wrist ulnar deviation 52 40  Wrist radial deviation 11 22  Wrist pronation 90 90  Wrist supination 24 42  (Blank rows = not tested)  * Able to make full composite fist each hand and oppose all digits to thumb.   UPPER EXTREMITY MMT:     MMT Right eval Left eval  Elbow flexion    Elbow extension    Wrist flexion 4+ 4+  Wrist extension 4+ 4+  Wrist ulnar deviation    Wrist radial deviation    Wrist pronation    Wrist supination    (Blank rows = not tested)  HAND FUNCTION: Grip strength: Right: 37 lbs; Left: 40 lbs, Lateral pinch: Right: 18 lbs, Left: 9 lbs, 3 point pinch:  Right: 21 lbs, Left: 17 lbs, and Tip pinch: Right 12 lbs, Left: 11 lbs  COORDINATION: 9 Hole Peg test: Right: 38 sec; Left: 24 sec  SENSATION: Light touch: Impaired , bilat hands along median nerve distribution (pt reports worst pain and numbness is in her IF and LF of each hand)  EDEMA: Enlarged joints of the fingers r/t RA  COGNITION: Overall cognitive status: Within functional limits for tasks assessed  OBSERVATIONS: +Tinel's bilaterally, + Phalen's bilaterally  TODAY'S TREATMENT:                                                                                                                              DATE: 05/21/22: Moist heat applied to bilat hands x5 min for muscle relaxation/pain reduction in prep for therapeutic exercises.  Self Care: Education provided on joint protection for bilat hands and cumulative trauma prevention, specifically alternating activity with rest, avoiding prolonged tight grasp, working with larger muscles and with wrists in neutral position.  Handouts provided.    Therapeutic Exercise: Reviewed proximal and distal median nerve glides bilaterally.  Pt required mod A to achieve correct positioning with each position of the sequence on both arms.  Pt is limited in her elbow extension and forearm supination bilaterally which limits ability to stretch median nerve without assist.  Instructed pt in active elbow extension exercises with gravity assisted and self passive stretching for wrist flex/ext bilaterally.  Completed hand gripper with moderate resistance (1 green band) for bilat grip strengthening.  Pt completed 3 sets 10 reps each hand, with instruction to rest, alternate hands, and limit reps of gripping at home as to avoid flaring up arthritic hands.  Pt verbalized understanding but needs additional reinforcement.  PATIENT EDUCATION: Education details: HEP Person educated: Patient Education method: Explanation Education comprehension: verbalized  understanding and needs further education  HOME EXERCISE PROGRAM: [place] theraputty  GOALS: Goals reviewed with patient? Yes  SHORT TERM GOALS: Target date: 06/10/22 (3 weeks)  Pt will be indep to perform HEP for improving bilat hand flexibility, strength, and coordination. Baseline: Eval: not yet initiated Goal status: INITIAL   2.  Pt will be indep to verbalize and demo 2-3 joint protection strategies and/or pieces of AE to reduce strain in hands during daily tasks.  Baseline: Eval: Educated not yet initiated Goal status: INITIAL     LONG TERM GOALS: Target date: 07/01/22 (6 weeks)   Pt will increase FOTO score to 66 or better to indicate clinically relevant self perceived improvement in performance of daily tasks.  Baseline: Eval: 63; predicted 66 Goal status: INITIAL    2.  Pt will increase bilat grip strength by 5 or more lbs to improve ability to securely hold a grocery bag in either hand. Baseline: Eval: R 37#, L 40# Goal status: INITIAL   3.  Pt will tolerate manual therapy, therapeutic modalities, splinting, and/or exercises to decrease pain in bilat hands to a reported 3/10 pain or less with activity or at night time.. Baseline: Eval: 7-8/10 pain in bilat hands with activity or night time Goal status: INITIAL    4.  Pt will improve FMC/dexterity skills in bilat hands to be able to manage anterior clothing fasteners. Baseline: Eval: R 9 hole: 38 sec; L 24 sec; pt avoids clothing with fasteners Goal status: INITIAL  ASSESSMENT:  CLINICAL IMPRESSION: Pt tolerated moist heat well this date.  Reviewed median nerve glides, but pt requires mod A to achieve correct positioning with each position of the sequence on both arms as pt is limited in her elbow extension and forearm supination bilaterally which limits ability to stretch median nerve without assist.  Initiated education on joint protection and cumulative trauma prevention.  Handouts issued for reinforcement.  Pt will  benefit from continued skilled OT for instruction in HEP progression for improving strength, coordination, and flexibility in bilat hands, to provide education on joint protection/cumulative trauma prevention strategies, ADL and AE training, and splinting/modalities as needed for pain management in bilat hands.  Pt in agreement with poc.   PERFORMANCE DEFICITS: in functional skills including ADLs, IADLs, coordination, dexterity, sensation, ROM, strength, pain, flexibility, Fine motor control, body mechanics, decreased knowledge of precautions, decreased knowledge of use of DME, and UE functional use, and psychosocial skills including environmental adaptation, habits, and routines and behaviors.   IMPAIRMENTS: are limiting patient from ADLs, IADLs, and rest and sleep.   COMORBIDITIES: has co-morbidities such as RA, obesity  that affects occupational performance. Patient will benefit from skilled OT to address above impairments and improve overall function.  MODIFICATION OR ASSISTANCE TO COMPLETE EVALUATION: No modification of tasks or assist necessary to complete an evaluation.  OT OCCUPATIONAL PROFILE AND HISTORY: Problem focused assessment: Including review of records relating to presenting problem.  CLINICAL DECISION MAKING: Moderate - several treatment options, min-mod task modification necessary  REHAB POTENTIAL: Fair ; (lives alone, multiple co morbidities )  EVALUATION COMPLEXITY: Moderate      PLAN:  OT FREQUENCY: 2x/week  OT DURATION: 6 weeks  PLANNED INTERVENTIONS: self care/ADL training, therapeutic exercise, therapeutic activity, manual therapy, passive range of motion, splinting, iontophoresis, paraffin, moist heat, cryotherapy, contrast bath, patient/family education, and DME and/or AE instructions  RECOMMENDED OTHER SERVICES: PT referral to address deficits related to cervical changes, decreased mobility  CONSULTED AND AGREED WITH PLAN OF CARE: Patient  PLAN FOR NEXT  SESSION: see plan  Leta Speller, MS, OTR/L  Darleene Cleaver, OT 05/27/2022, 2:37 PM

## 2022-05-30 ENCOUNTER — Ambulatory Visit: Payer: Medicare PPO

## 2022-05-30 DIAGNOSIS — R202 Paresthesia of skin: Secondary | ICD-10-CM

## 2022-05-30 DIAGNOSIS — M6281 Muscle weakness (generalized): Secondary | ICD-10-CM

## 2022-06-02 NOTE — Therapy (Signed)
OUTPATIENT OCCUPATIONAL THERAPY ORTHO TREATMENT  Patient Name: Bonnie Hinton MRN: [id number] DOB:1941/03/15, 82 y.o., female Today's Date: 06/02/2022  PCP: Dr. Deborra Medina REFERRING PROVIDER: Dr. Farrel Gordon END OF SESSION:  OT End of Session - 06/02/22 2248     Visit Number 4    Number of Visits 12    Date for OT Re-Evaluation 07/01/22    Progress Note Due on Visit 10    OT Start Time 0845    OT Stop Time 0930    OT Time Calculation (min) 45 min    Equipment Utilized During Treatment transport chair    Activity Tolerance Patient tolerated treatment well    Behavior During Therapy WFL for tasks assessed/performed             Past Medical History:  Diagnosis Date   Anxiety    Arthritis    Choledocholithiasis    Chronic back pain    stenosis and arthritis   Community acquired pneumonia [id number]   Complication of anesthesia    anxious when she wakes up   GERD (gastroesophageal reflux disease)    takes Pantoprazole daily   Glaucoma    dry angle   History of bronchitis    30+ yrs ago   History of MRSA infection 2004   several yrs ago   Hyperlipidemia    takes Zetia and Crestor daily   Hypertension    take Amlodipine daily   Hypothyroidism    takes Synthroid daily   Joint pain    Joint swelling    Neuropathy    takes Gabapentin daily   Pancreatitis, gallstone    Peripheral edema    takes Furosemide daily as needed   Pneumonia    hx of-30 + yrs ago   Pre-diabetes    takes Victoza daily   Primary localized osteoarthritis of right knee    Subdural hematoma ([place]) 8 yrs ago   hx of   Urinary frequency    Urinary urgency    Past Surgical History:  Procedure Laterality Date   ABDOMINAL HYSTERECTOMY     CHOLECYSTECTOMY N/A 09/09/2016   Procedure: LAPAROSCOPIC CHOLECYSTECTOMY;  Surgeon: Donnie Mesa, MD;  Location: [facility];  Service: General;  Laterality: N/A;   COLONOSCOPY     RADIOLOGY WITH ANESTHESIA N/A 08/22/2015   Procedure: MRI OF  LUMBAR SPINE   (RADIOLOGY WITH ANESTHESIA);  Surgeon: Medication Radiologist, MD;  Location: [facility];  Service: Radiology;  Laterality: N/A;   TOTAL HIP ARTHROPLASTY Right    TOTAL HIP ARTHROPLASTY Left    TOTAL KNEE ARTHROPLASTY Left    TOTAL KNEE ARTHROPLASTY Right 01/01/2016   Procedure: TOTAL KNEE ARTHROPLASTY;  Surgeon: Elsie Saas, MD;  Location: [facility];  Service: Orthopedics;  Laterality: Right;   Patient Active Problem List   Diagnosis Date Noted   Hordeolum externum of left upper eyelid 02/20/2022   Polyarthritis 10/26/2021   Foot deformity, acquired, left 10/25/2021   Rash 10/25/2021   Osteopenia after menopause 10/25/2021   Pitting edema 10/25/2021   Mass of breast, left 08/08/2021   Walker as ambulation aid 07/23/2021   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   Numbness and tingling in right hand 04/25/2021   Mild protein-calorie malnutrition ([place]) 08/16/2020   OA (osteoarthritis) of finger, right 04/05/2020   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   Candidiasis of skin 06/28/2017   IBS (irritable bowel syndrome) 06/28/2017   Screening for colon cancer 12/28/2016   Dizziness 05/24/2016   Anxiety, generalized 02/20/2016   Arthritis  Acquired hypothyroidism    History of CVA (cerebrovascular accident)    Status post bilateral hip replacements    Status post right knee replacement    Degenerative lumbar spinal stenosis    Essential hypertension, benign 07/25/2015   Preoperative clearance 07/25/2015   Hyperlipidemia associated with type 2 diabetes mellitus ([place]) 07/25/2015   Mixed stress and urge urinary incontinence 04/26/2015   Drug-induced hyperkalemia 04/26/2015   Diabetic nephropathy associated with type 2 diabetes mellitus ([place]) 01/24/2015   Morbid obesity ([place]) 01/24/2015   Vitamin D deficiency 01/24/2015   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 Pt accompanied by: self  PERTINENT HISTORY: Per chart: [facility] 1. Bilateral hand paresthesias, carpal tunnel syndrome + generalized sensory motor peripheral polyneuropathy in a patient with severe Vitamin B12 deficiency + arthritis   We will set up Occupational Therapy for hands.  We reviewed patient's neuroimaging report and actual images with patient. X-ray cervical spine 01/29/2022: Severe arthritic changes at C6-7 and moderate a level   MRI C-Spine without contrast 02/11/2022: At C3-4 there is a mild broad-based disc bulge. Mild right facet arthropathy and right uncovertebral degenerative changes. Moderate right foraminal stenosis. At C4-5 there is a mild broad-based disc bulge. Bilateral uncovertebral degenerative changes. Moderate bilateral facet arthropathy. Moderate-severe right foraminal stenosis. Mild-moderate left foraminal stenosis. At C6-7 there is a broad-based disc bulge. Small right paracentral disc protrusion. Left uncovertebral degenerative changes. Severe left foraminal stenosis. No acute osseous injury of the cervical spine.   NCS upper extremity 04/17/2022: This is an abnormal electrodiagnostic study consistent with a 1) generalized severe sensorimotor polyneuropathy. 2) Can't rule out component of superimposed carpal tunnel syndrome.   PRECAUTIONS: None  WEIGHT BEARING RESTRICTIONS: No  PAIN:  Are you having pain? Yes: NPRS scale: 6 at rest, 7-8 at night time/10 Pain location: bilat hands Pain description: stiff Aggravating factors: night time Relieving factors: nothing   FALLS: Has patient fallen in last 6 months? No  LIVING ENVIRONMENT: Lives with: lives alone Lives in: 1 level house  Stairs: none Has  following equipment at home: Gilford Rile - 4 wheeled, Wheelchair (manual), shower chair, Grab bars, and grab bars in shower and by toilet  PLOF: Independent, daughter lives in [place] about 20 min away  PATIENT GOALS: "I'd like to get my hands back to where they're not hurting."   NEXT MD VISIT:   OBJECTIVE:   HAND DOMINANCE: Right  ADLs: Overall ADLs: modified indep/extra time, mostly from wc level Transfers/ambulation related to ADLs: pt reports she mostly uses the wc at home; pt is trying to get back to using her walker and reports that she gets up with the walker every hour to walk a little  Eating: able to cut food  Grooming: indep UB Dressing: pt reports she does not wear a bra because she can't hook it  LB Dressing: wears elastic waist pants to avoid buttons/zippers; pt ties shoes loosely so she can slip them on Toileting: modified indep Bathing: modified indep Tub Shower transfers: modified indep  Equipment:  see above   FUNCTIONAL OUTCOME MEASURES: FOTO: 63; predicted 66  UPPER EXTREMITY ROM:  Active ROM Right eval Left eval  Elbow flexion    Elbow extension    Wrist flexion 77 81  Wrist extension 46 58  Wrist ulnar deviation 52 40  Wrist radial deviation 11 22  Wrist pronation 90 90  Wrist supination 24 42  (Blank rows = not tested)  * Able to make full composite fist each hand and oppose all digits to thumb.   UPPER EXTREMITY MMT:     MMT Right eval Left eval  Elbow flexion    Elbow extension    Wrist flexion 4+ 4+  Wrist extension 4+ 4+  Wrist ulnar deviation    Wrist radial deviation    Wrist pronation    Wrist supination    (Blank rows = not tested)  HAND FUNCTION: Grip strength: Right: 37 lbs; Left: 40 lbs, Lateral pinch: Right: 18 lbs, Left: 9 lbs, 3 point pinch: Right: 21 lbs, Left: 17 lbs, and Tip pinch: Right 12 lbs, Left: 11 lbs  COORDINATION: 9 Hole Peg test: Right: 38 sec; Left: 24 sec  SENSATION: Light touch: Impaired , bilat  hands along median nerve distribution (pt reports worst pain and numbness is in her IF and LF of each hand)  EDEMA: Enlarged joints of the fingers r/t RA  COGNITION: Overall cognitive status: Within functional limits for tasks assessed  OBSERVATIONS: +Tinel's bilaterally, + Phalen's bilaterally  TODAY'S TREATMENT:                                                                                                                              Therapeutic Exercise: Reviewed proximal and distal median nerve glides bilaterally.  Pt required mod A to achieve correct positioning with each position of the sequence on both arms.  Pt continues to be limited in her elbow extension and forearm supination bilaterally which limits ability to stretch median nerve without assist.  Reviewed active elbow extension exercises with gravity assisted and self passive stretching for wrist flex/ext bilaterally.  Completed hand gripper with moderate resistance (1 green band) for bilat grip strengthening.  Pt completed 3 sets 10 reps each hand, with instruction to rest, alternate hands, and limit reps of gripping at home as to avoid flaring up arthritic hands.  Pt verbalized understanding but needs additional reinforcement.  Contrast Bath: 15 min Alternating 3 min moist heat then 1 min cold pack x3 rounds, beginning and ending with 3 min moist heat to bilat wristshands, working to increase circulation/decrease pain/decrease swelling in the B carpal tunnel regions.   Orthotic Fit Training: Reviewed appropriate fit for bilat carpal tunnel braces, avoiding pulling velcro too tight to avoid cutting off circulation to hands.  Educated on proper fit; splints should be snug but not contributing to any increased numbness in the hands.  Adjusted splint to secure thumb web space strap to rest on webspace; pt had splint pulled too far distally on the hand.  Replaced the current flexible bars in the splints with  solid metal bars (assortment  of bars came with splints) so that wrists can maintain neutral position with activity.  Encouraged pt wear splints during the day with activity as pt has been reporting significant numbness in bilat hands during the day.  Advised to remove intermittently for hand hygiene, HEP, or necessary ADLs.  Pt is wearing splints at night time.  Pt agreed to recommendations.  PATIENT EDUCATION: Education details: contrast bath Person educated: Patient Education method: Consulting civil engineer, demo, handout issued Education comprehension: verbalized understanding and needs further education  HOME EXERCISE PROGRAM: [place] the[employer]ty  GOALS: Goals reviewed with patient? Yes  SHORT TERM GOALS: Target date: 06/10/22 (3 weeks)  Pt will be indep to perform HEP for improving bilat hand flexibility, strength, and coordination. Baseline: Eval: not yet initiated Goal status: INITIAL   2.  Pt will be indep to verbalize and demo 2-3 joint protection strategies and/or pieces of AE to reduce strain in hands during daily tasks.  Baseline: Eval: Educated not yet initiated Goal status: INITIAL     LONG TERM GOALS: Target date: 07/01/22 (6 weeks)   Pt will increase FOTO score to 66 or better to indicate clinically relevant self perceived improvement in performance of daily tasks.  Baseline: Eval: 63; predicted 66 Goal status: INITIAL    2.  Pt will increase bilat grip strength by 5 or more lbs to improve ability to securely hold a grocery bag in either hand. Baseline: Eval: R 37#, L 40# Goal status: INITIAL   3.  Pt will tolerate manual therapy, therapeutic modalities, splinting, and/or exercises to decrease pain in bilat hands to a reported 3/10 pain or less with activity or at night time.. Baseline: Eval: 7-8/10 pain in bilat hands with activity or night time Goal status: INITIAL    4.  Pt will improve FMC/dexterity skills in bilat hands to be able to manage anterior clothing fasteners. Baseline: Eval: R 9 hole: 38  sec; L 24 sec; pt avoids clothing with fasteners Goal status: INITIAL  ASSESSMENT:  CLINICAL IMPRESSION: Pt tolerated therapeutic exercises with some reported joint/muscle soreness.  Suspect muscle soreness from relatively new stretches since start of OT sessions.  Continue to review median nerve glides, but pt requires mod A to achieve correct positioning with each position of the sequence on both arms as pt is limited in her elbow extension and forearm supination bilaterally which limits ability to stretch median nerve without assist.  Pt tolerated contrast cold pack/moist heat well this date for bilat hands and OT provided handout for pt to perform this at home to manage pain, swelling, and promote circulation in bilat hands.  Adjusted carpal tunnel braces as noted above and encouraged pt start to wear braces during the day with activity d/t reported numbness/pain during the day time hours as well.  Pt is consistent to wear them at night time but agreed to wear them intermittently during the day.  Pt will benefit from continued skilled OT for instruction in HEP progression for improving strength, coordination, and flexibility in bilat hands, to provide education on joint protection/cumulative trauma prevention strategies, ADL and AE training, and splinting/modalities as needed for pain management in bilat hands.  Pt in agreement with poc.   PERFORMANCE DEFICITS: in functional skills including ADLs, IADLs, coordination, dexterity, sensation, ROM, strength, pain, flexibility, Fine motor control, body mechanics, decreased knowledge of precautions, decreased knowledge of use of DME, and UE functional use, and psychosocial skills including environmental adaptation, habits, and routines and behaviors.  IMPAIRMENTS: are limiting patient from ADLs, IADLs, and rest and sleep.   COMORBIDITIES: has co-morbidities such as RA, obesity  that affects occupational performance. Patient will benefit from skilled OT to  address above impairments and improve overall function.  MODIFICATION OR ASSISTANCE TO COMPLETE EVALUATION: No modification of tasks or assist necessary to complete an evaluation.  OT OCCUPATIONAL PROFILE AND HISTORY: Problem focused assessment: Including review of records relating to presenting problem.  CLINICAL DECISION MAKING: Moderate - several treatment options, min-mod task modification necessary  REHAB POTENTIAL: Fair ; (lives alone, multiple co morbidities )  EVALUATION COMPLEXITY: Moderate      PLAN:  OT FREQUENCY: 2x/week  OT DURATION: 6 weeks  PLANNED INTERVENTIONS: self care/ADL training, therapeutic exercise, therapeutic activity, manual therapy, passive range of motion, splinting, iontophoresis, paraffin, moist heat, cryotherapy, contrast bath, patient/family education, and DME and/or AE instructions  RECOMMENDED OTHER SERVICES: PT referral to address deficits related to cervical changes, decreased mobility  CONSULTED AND AGREED WITH PLAN OF CARE: Patient  PLAN FOR NEXT SESSION: see plan  Leta Speller, MS, OTR/L  Darleene Cleaver, OT 06/02/2022, 10:50 PM

## 2022-06-03 ENCOUNTER — Ambulatory Visit: Payer: Medicare PPO

## 2022-06-03 DIAGNOSIS — M6281 Muscle weakness (generalized): Secondary | ICD-10-CM

## 2022-06-03 DIAGNOSIS — R202 Paresthesia of skin: Secondary | ICD-10-CM

## 2022-06-03 NOTE — Therapy (Signed)
OUTPATIENT OCCUPATIONAL THERAPY ORTHO TREATMENT  Patient Name: Bonnie Hinton MRN: [id number] DOB:17-Jun-1940, 82 y.o., female Today's Date: 06/03/2022  PCP: Dr. Deborra Medina REFERRING PROVIDER: Dr. Farrel Gordon END OF SESSION:  OT End of Session - 06/03/22 1454     Visit Number 5    Number of Visits 12    Date for OT Re-Evaluation 07/01/22    Progress Note Due on Visit 10    OT Start Time 0845    OT Stop Time 0930    OT Time Calculation (min) 45 min    Equipment Utilized During Treatment transport chair    Activity Tolerance Patient tolerated treatment well    Behavior During Therapy WFL for tasks assessed/performed             Past Medical History:  Diagnosis Date   Anxiety    Arthritis    Choledocholithiasis    Chronic back pain    stenosis and arthritis   Community acquired pneumonia [id number]   Complication of anesthesia    anxious when she wakes up   GERD (gastroesophageal reflux disease)    takes Pantoprazole daily   Glaucoma    dry angle   History of bronchitis    30+ yrs ago   History of MRSA infection 2004   several yrs ago   Hyperlipidemia    takes Zetia and Crestor daily   Hypertension    take Amlodipine daily   Hypothyroidism    takes Synthroid daily   Joint pain    Joint swelling    Neuropathy    takes Gabapentin daily   Pancreatitis, gallstone    Peripheral edema    takes Furosemide daily as needed   Pneumonia    hx of-30 + yrs ago   Pre-diabetes    takes Victoza daily   Primary localized osteoarthritis of right knee    Subdural hematoma ([place]) 8 yrs ago   hx of   Urinary frequency    Urinary urgency    Past Surgical History:  Procedure Laterality Date   ABDOMINAL HYSTERECTOMY     CHOLECYSTECTOMY N/A 09/09/2016   Procedure: LAPAROSCOPIC CHOLECYSTECTOMY;  Surgeon: Donnie Mesa, MD;  Location: [facility];  Service: General;  Laterality: N/A;   COLONOSCOPY     RADIOLOGY WITH ANESTHESIA N/A 08/22/2015   Procedure: MRI OF  LUMBAR SPINE   (RADIOLOGY WITH ANESTHESIA);  Surgeon: Medication Radiologist, MD;  Location: [facility];  Service: Radiology;  Laterality: N/A;   TOTAL HIP ARTHROPLASTY Right    TOTAL HIP ARTHROPLASTY Left    TOTAL KNEE ARTHROPLASTY Left    TOTAL KNEE ARTHROPLASTY Right 01/01/2016   Procedure: TOTAL KNEE ARTHROPLASTY;  Surgeon: Elsie Saas, MD;  Location: [facility];  Service: Orthopedics;  Laterality: Right;   Patient Active Problem List   Diagnosis Date Noted   Hordeolum externum of left upper eyelid 02/20/2022   Polyarthritis 10/26/2021   Foot deformity, acquired, left 10/25/2021   Rash 10/25/2021   Osteopenia after menopause 10/25/2021   Pitting edema 10/25/2021   Mass of breast, left 08/08/2021   Walker as ambulation aid 07/23/2021   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   Numbness and tingling in right hand 04/25/2021   Mild protein-calorie malnutrition ([place]) 08/16/2020   OA (osteoarthritis) of finger, right 04/05/2020   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   Candidiasis of skin 06/28/2017   IBS (irritable bowel syndrome) 06/28/2017   Screening for colon cancer 12/28/2016   Dizziness 05/24/2016   Anxiety, generalized 02/20/2016   Arthritis  Acquired hypothyroidism    History of CVA (cerebrovascular accident)    Status post bilateral hip replacements    Status post right knee replacement    Degenerative lumbar spinal stenosis    Essential hypertension, benign 07/25/2015   Preoperative clearance 07/25/2015   Hyperlipidemia associated with type 2 diabetes mellitus ([place]) 07/25/2015   Mixed stress and urge urinary incontinence 04/26/2015   Drug-induced hyperkalemia 04/26/2015   Diabetic nephropathy associated with type 2 diabetes mellitus ([place]) 01/24/2015   Morbid obesity ([place]) 01/24/2015   Vitamin D deficiency 01/24/2015   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  Pt states no increase in use of her wrist braces during the day as she finds them limiting to perform her household duties.  Pt accompanied by: self  PERTINENT HISTORY: Per chart: [facility] 1. Bilateral hand paresthesias, carpal tunnel syndrome + generalized sensory motor peripheral polyneuropathy in a patient with severe Vitamin B12 deficiency + arthritis   We will set up Occupational Therapy for hands.  We reviewed patient's neuroimaging report and actual images with patient. X-ray cervical spine 01/29/2022: Severe arthritic changes at C6-7 and moderate a level   MRI C-Spine without contrast 02/11/2022: At C3-4 there is a mild broad-based disc bulge. Mild right facet arthropathy and right uncovertebral degenerative changes. Moderate right foraminal stenosis. At C4-5 there is a mild broad-based disc bulge. Bilateral uncovertebral degenerative changes. Moderate bilateral facet arthropathy. Moderate-severe right foraminal stenosis. Mild-moderate left foraminal stenosis. At C6-7 there is a broad-based disc bulge. Small right paracentral disc protrusion. Left uncovertebral degenerative changes. Severe left foraminal stenosis. No acute osseous injury of the cervical spine.   NCS upper extremity 04/17/2022: This is an abnormal electrodiagnostic study consistent with a 1) generalized severe sensorimotor polyneuropathy. 2) Can't rule out component of superimposed carpal tunnel syndrome.   PRECAUTIONS: None  WEIGHT BEARING RESTRICTIONS: No  PAIN: Pt reports no pain, but just tingling/numbness in the hands.    Eval: Are you having pain? Yes: NPRS scale: 6 at rest, 7-8 at night time/10 Pain location: bilat hands Pain description:  stiff Aggravating factors: night time Relieving factors: nothing   FALLS: Has patient fallen in last 6 months? No  LIVING ENVIRONMENT: Lives with: lives alone Lives in: 1 level house  Stairs: none Has following equipment at home: Gilford Rile - 4 wheeled, Wheelchair (manual), shower chair, Grab bars, and grab bars in shower and by toilet  PLOF: Independent, daughter lives in [place] about 20 min away  PATIENT GOALS: "I'd like to get my hands back to where they're not hurting."   NEXT MD VISIT:   OBJECTIVE:   HAND DOMINANCE: Right  ADLs: Overall ADLs: modified indep/extra time, mostly from wc level Transfers/ambulation related to ADLs: pt reports she mostly uses the wc at home; pt is trying to get back to using her walker and reports that she gets up with the walker every hour to walk a little  Eating: able to cut food  Grooming: indep UB Dressing: pt reports she does not wear a bra because she can't hook it  LB Dressing: wears elastic waist pants to avoid buttons/zippers; pt ties shoes  loosely so she can slip them on Toileting: modified indep Bathing: modified indep Tub Shower transfers: modified indep  Equipment:  see above   FUNCTIONAL OUTCOME MEASURES: FOTO: 63; predicted 66  UPPER EXTREMITY ROM:     Active ROM Right eval Left eval  Elbow flexion    Elbow extension    Wrist flexion 77 81  Wrist extension 46 58  Wrist ulnar deviation 52 40  Wrist radial deviation 11 22  Wrist pronation 90 90  Wrist supination 24 42  (Blank rows = not tested)  * Able to make full composite fist each hand and oppose all digits to thumb.   UPPER EXTREMITY MMT:     MMT Right eval Left eval  Elbow flexion    Elbow extension    Wrist flexion 4+ 4+  Wrist extension 4+ 4+  Wrist ulnar deviation    Wrist radial deviation    Wrist pronation    Wrist supination    (Blank rows = not tested)  HAND FUNCTION: Grip strength: Right: 37 lbs; Left: 40 lbs, Lateral pinch: Right: 18  lbs, Left: 9 lbs, 3 point pinch: Right: 21 lbs, Left: 17 lbs, and Tip pinch: Right 12 lbs, Left: 11 lbs  COORDINATION: 9 Hole Peg test: Right: 38 sec; Left: 24 sec  SENSATION: Light touch: Impaired , bilat hands along median nerve distribution (pt reports worst pain and numbness is in her IF and LF of each hand)  EDEMA: Enlarged joints of the fingers r/t RA  COGNITION: Overall cognitive status: Within functional limits for tasks assessed  OBSERVATIONS: +Tinel's bilaterally, + Phalen's bilaterally  TODAY'S TREATMENT:                                                                                                                              Therapeutic Exercise: Pt brought in her new hand gripper.  OT assisted with set up.  Pt tolerated 1 yellow band and 1 red band 15# total to perform 3 sets 10 reps on each hand.  Encouraged pt use at home but to be mindful of reps to avoid strain/over use.  Recommended 1-3 sets 10 reps in the a.m and may do again in the p.m. if desired.    Self Care: Reinforced recommendation to wear wrist braces during the day with household chores when able.  Encouraged pt don disposable gloves as needed when taking care of her plants.  Reviewed cumulative trauma prevention and joint protection strategies for bilat hands, encouraged built up tools, working with neutral wrists, taking frequent rest breaks, and stretching before/after activities.  Made recommendation to try Voltaren gel for additional pain/sx management and continue with contrast baths.  Pt reports that she is struggling to donn/doff her braces as the velcro is tough.  Pt plans to sew on a tap to ease this process.  OT encouraged pt use a looped tab to enable pulling  with a hooked finger rather than pinching to pull  off the velcro.  Pt receptive.  Pt reports that she's planning to begin painting a room.  OT verbalized concern of worsening symptoms in her hands with this type of a project, but pt feels it must  be done.  OT advised on importance of using built up handles for painting and allowing frequent rest breaks with activity.  Encouraged pt wear her carpal tunnel braces as able to maintain neutral wrists and to wear gloves over braces while painting if able.  Pt receptive.    PATIENT EDUCATION: Education details: contrast bath, splinting schedule, joint protection Person educated: Patient Education method: Explanation, demo, handout issued Education comprehension: verbalized understanding and needs further education  HOME EXERCISE PROGRAM: [place] theraputty  GOALS: Goals reviewed with patient? Yes  SHORT TERM GOALS: Target date: 06/10/22 (3 weeks)  Pt will be indep to perform HEP for improving bilat hand flexibility, strength, and coordination. Baseline: Eval: not yet initiated Goal status: INITIAL   2.  Pt will be indep to verbalize and demo 2-3 joint protection strategies and/or pieces of AE to reduce strain in hands during daily tasks.  Baseline: Eval: Educated not yet initiated Goal status: INITIAL     LONG TERM GOALS: Target date: 07/01/22 (6 weeks)   Pt will increase FOTO score to 66 or better to indicate clinically relevant self perceived improvement in performance of daily tasks.  Baseline: Eval: 63; predicted 66 Goal status: INITIAL    2.  Pt will increase bilat grip strength by 5 or more lbs to improve ability to securely hold a grocery bag in either hand. Baseline: Eval: R 37#, L 40# Goal status: INITIAL   3.  Pt will tolerate manual therapy, therapeutic modalities, splinting, and/or exercises to decrease pain in bilat hands to a reported 3/10 pain or less with activity or at night time.. Baseline: Eval: 7-8/10 pain in bilat hands with activity or night time Goal status: INITIAL    4.  Pt will improve FMC/dexterity skills in bilat hands to be able to manage anterior clothing fasteners. Baseline: Eval: R 9 hole: 38 sec; L 24 sec; pt avoids clothing with  fasteners Goal status: INITIAL  ASSESSMENT:  CLINICAL IMPRESSION: Pt verbalized frustration that her hands are not better by this time, and reports that feel about the same.  Pt reports that she has not tried to increase wearing time of her carpal tunnel braces during the day as instructed by OT, as she states they limit her ability to do her housework.  OT advised that house work can be quite demanding on the wrist and hands and this type of activity is when braces should be worn and OT encouraged pt wear disposable gloves over braces when able.  When braces are doffed, reinforced importance of trying to work with neutral wrist positions when able.  Pt reports that she's planning to begin painting a room.  OT verbalized concern of worsening symptoms in her hands with this type of a project, but pt feels it must be done.  OT advised on importance of using built up handles for painting and allowing frequent rest breaks with activity.  Encouraged pt wear her carpal tunnel braces as able to maintain neutral wrists and to wear gloves over braces while painting if able.  Pt receptive.  Plan to initiate iontophoresis treatments beginning next week so that pt may have 2 dexamethasone treatments in same week and cancelled 2nd OT visit this week so that pt wouldn't have a long gap between her  first and 2nd ionto tx.  Pt will benefit from continued skilled OT for instruction in HEP progression for improving strength, coordination, and flexibility in bilat hands, to provide education on joint protection/cumulative trauma prevention strategies, ADL and AE training, and splinting/modalities as needed for pain management in bilat hands.  Pt in agreement with poc.   PERFORMANCE DEFICITS: in functional skills including ADLs, IADLs, coordination, dexterity, sensation, ROM, strength, pain, flexibility, Fine motor control, body mechanics, decreased knowledge of precautions, decreased knowledge of use of DME, and UE functional  use, and psychosocial skills including environmental adaptation, habits, and routines and behaviors.   IMPAIRMENTS: are limiting patient from ADLs, IADLs, and rest and sleep.   COMORBIDITIES: has co-morbidities such as RA, obesity  that affects occupational performance. Patient will benefit from skilled OT to address above impairments and improve overall function.  MODIFICATION OR ASSISTANCE TO COMPLETE EVALUATION: No modification of tasks or assist necessary to complete an evaluation.  OT OCCUPATIONAL PROFILE AND HISTORY: Problem focused assessment: Including review of records relating to presenting problem.  CLINICAL DECISION MAKING: Moderate - several treatment options, min-mod task modification necessary  REHAB POTENTIAL: Fair ; (lives alone, multiple co morbidities )  EVALUATION COMPLEXITY: Moderate      PLAN:  OT FREQUENCY: 2x/week  OT DURATION: 6 weeks  PLANNED INTERVENTIONS: self care/ADL training, therapeutic exercise, therapeutic activity, manual therapy, passive range of motion, splinting, iontophoresis, paraffin, moist heat, cryotherapy, contrast bath, patient/family education, and DME and/or AE instructions  RECOMMENDED OTHER SERVICES: PT referral to address deficits related to cervical changes, decreased mobility  CONSULTED AND AGREED WITH PLAN OF CARE: Patient  PLAN FOR NEXT SESSION: see plan  Leta Speller, MS, OTR/L  Darleene Cleaver, OT 06/03/2022, 3:02 PM

## 2022-06-06 ENCOUNTER — Ambulatory Visit: Payer: Medicare PPO

## 2022-06-07 DIAGNOSIS — M3501 Sicca syndrome with keratoconjunctivitis: Secondary | ICD-10-CM | POA: Diagnosis not present

## 2022-06-11 ENCOUNTER — Ambulatory Visit: Payer: Medicare PPO

## 2022-06-11 DIAGNOSIS — R202 Paresthesia of skin: Secondary | ICD-10-CM

## 2022-06-11 DIAGNOSIS — M6281 Muscle weakness (generalized): Secondary | ICD-10-CM

## 2022-06-11 NOTE — Therapy (Unsigned)
OUTPATIENT OCCUPATIONAL THERAPY ORTHO TREATMENT  Patient Name: Bonnie Hinton MRN: [id number] DOB:[birth year], 82 y.o., female Today's Date: 06/11/2022  PCP: Dr. Deborra Medina REFERRING PROVIDER: Dr. Farrel Gordon END OF SESSION:  OT End of Session - 06/11/22 1632     Visit Number 6    Number of Visits 12    Date for OT Re-Evaluation 07/01/22    Progress Note Due on Visit 10    OT Start Time 0845    OT Stop Time 0930    OT Time Calculation (min) 45 min    Equipment Utilized During Treatment transport chair    Activity Tolerance Patient tolerated treatment well    Behavior During Therapy WFL for tasks assessed/performed             Past Medical History:  Diagnosis Date   Anxiety    Arthritis    Choledocholithiasis    Chronic back pain    stenosis and arthritis   Community acquired pneumonia [id number]   Complication of anesthesia    anxious when she wakes up   GERD (gastroesophageal reflux disease)    takes Pantoprazole daily   Glaucoma    dry angle   History of bronchitis    30+ yrs ago   History of MRSA infection 2004   several yrs ago   Hyperlipidemia    takes Zetia and Crestor daily   Hypertension    take Amlodipine daily   Hypothyroidism    takes Synthroid daily   Joint pain    Joint swelling    Neuropathy    takes Gabapentin daily   Pancreatitis, gallstone    Peripheral edema    takes Furosemide daily as needed   Pneumonia    hx of-30 + yrs ago   Pre-diabetes    takes Victoza daily   Primary localized osteoarthritis of right knee    Subdural hematoma ([place]) 8 yrs ago   hx of   Urinary frequency    Urinary urgency    Past Surgical History:  Procedure Laterality Date   ABDOMINAL HYSTERECTOMY     CHOLECYSTECTOMY N/A 09/09/2016   Procedure: LAPAROSCOPIC CHOLECYSTECTOMY;  Surgeon: Donnie Mesa, MD;  Location: [place];  Service: General;  Laterality: N/A;   COLONOSCOPY     RADIOLOGY WITH ANESTHESIA N/A 08/22/2015   Procedure: MRI OF  LUMBAR SPINE   (RADIOLOGY WITH ANESTHESIA);  Surgeon: Medication Radiologist, MD;  Location: [place];  Service: Radiology;  Laterality: N/A;   TOTAL HIP ARTHROPLASTY Right    TOTAL HIP ARTHROPLASTY Left    TOTAL KNEE ARTHROPLASTY Left    TOTAL KNEE ARTHROPLASTY Right 01/01/2016   Procedure: TOTAL KNEE ARTHROPLASTY;  Surgeon: Elsie Saas, MD;  Location: [place];  Service: Orthopedics;  Laterality: Right;   Patient Active Problem List   Diagnosis Date Noted   Hordeolum externum of left upper eyelid 02/20/2022   Polyarthritis 10/26/2021   Foot deformity, acquired, left 10/25/2021   Rash 10/25/2021   Osteopenia after menopause 10/25/2021   Pitting edema 10/25/2021   Mass of breast, left 08/08/2021   Walker as ambulation aid 07/23/2021   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   Numbness and tingling in right hand 04/25/2021   Mild protein-calorie malnutrition ([place]) 08/16/2020   OA (osteoarthritis) of finger, right 04/05/2020   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   Candidiasis of skin 06/28/2017   IBS (irritable bowel syndrome) 06/28/2017   Screening for colon cancer 12/28/2016   Dizziness 05/24/2016   Anxiety, generalized 02/20/2016   Arthritis  Acquired hypothyroidism    History of CVA (cerebrovascular accident)    Status post bilateral hip replacements    Status post right knee replacement    Degenerative lumbar spinal stenosis    Essential hypertension, benign 07/25/2015   Preoperative clearance 07/25/2015   Hyperlipidemia associated with type 2 diabetes mellitus ([place]) 07/25/2015   Mixed stress and urge urinary incontinence 04/26/2015   Drug-induced hyperkalemia 04/26/2015   Diabetic nephropathy associated with type 2 diabetes mellitus ([place]) 01/24/2015   Morbid obesity ([place]) 01/24/2015   Vitamin D deficiency 01/24/2015   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  No significant changes in her symptoms despite increased use of her wrist braces. Pt accompanied by: self  PERTINENT HISTORY: Per chart: [facility] 1. Bilateral hand paresthesias, carpal tunnel syndrome + generalized sensory motor peripheral polyneuropathy in a patient with severe Vitamin B12 deficiency + arthritis   We will set up Occupational Therapy for hands.  We reviewed patient's neuroimaging report and actual images with patient. X-ray cervical spine 01/29/2022: Severe arthritic changes at C6-7 and moderate a level   MRI C-Spine without contrast 02/11/2022: At C3-4 there is a mild broad-based disc bulge. Mild right facet arthropathy and right uncovertebral degenerative changes. Moderate right foraminal stenosis. At C4-5 there is a mild broad-based disc bulge. Bilateral uncovertebral degenerative changes. Moderate bilateral facet arthropathy. Moderate-severe right foraminal stenosis. Mild-moderate left foraminal stenosis. At C6-7 there is a broad-based disc bulge. Small right paracentral disc protrusion. Left uncovertebral degenerative changes. Severe left foraminal stenosis. No acute osseous injury of the cervical spine.   NCS upper extremity 04/17/2022: This is an abnormal electrodiagnostic study consistent with a 1) generalized severe sensorimotor polyneuropathy. 2) Can't rule out component of superimposed carpal tunnel syndrome.   PRECAUTIONS: None  WEIGHT BEARING RESTRICTIONS: No  PAIN: Pt reports no pain, but just tingling/numbness in the hands.    Eval: Are you having pain? Yes: NPRS scale: 6 at rest, 7-8 at night time/10 Pain location: bilat hands Pain description: stiff Aggravating factors: night time Relieving factors: nothing   FALLS: Has  patient fallen in last 6 months? No  LIVING ENVIRONMENT: Lives with: lives alone Lives in: 1 level house  Stairs: none Has following equipment at home: Gilford Rile - 4 wheeled, Wheelchair (manual), shower chair, Grab bars, and grab bars in shower and by toilet  PLOF: Independent, daughter lives in [place] about 20 min away  PATIENT GOALS: "I'd like to get my hands back to where they're not hurting."   NEXT MD VISIT:   OBJECTIVE:   HAND DOMINANCE: Right  ADLs: Overall ADLs: modified indep/extra time, mostly from wc level Transfers/ambulation related to ADLs: pt reports she mostly uses the wc at home; pt is trying to get back to using her walker and reports that she gets up with the walker every hour to walk a little  Eating: able to cut food  Grooming: indep UB Dressing: pt reports she does not wear a bra because she can't hook it  LB Dressing: wears elastic waist pants to avoid buttons/zippers; pt ties shoes loosely so she can slip them on Toileting: modified indep Bathing: modified indep Tub Shower transfers: modified  indep  Equipment:  see above   FUNCTIONAL OUTCOME MEASURES: FOTO: 63; predicted 66  UPPER EXTREMITY ROM:     Active ROM Right eval Left eval  Elbow flexion    Elbow extension    Wrist flexion 77 81  Wrist extension 46 58  Wrist ulnar deviation 52 40  Wrist radial deviation 11 22  Wrist pronation 90 90  Wrist supination 24 42  (Blank rows = not tested)  * Able to make full composite fist each hand and oppose all digits to thumb.   UPPER EXTREMITY MMT:     MMT Right eval Left eval  Elbow flexion    Elbow extension    Wrist flexion 4+ 4+  Wrist extension 4+ 4+  Wrist ulnar deviation    Wrist radial deviation    Wrist pronation    Wrist supination    (Blank rows = not tested)  HAND FUNCTION: Grip strength: Right: 37 lbs; Left: 40 lbs, Lateral pinch: Right: 18 lbs, Left: 9 lbs, 3 point pinch: Right: 21 lbs, Left: 17 lbs, and Tip pinch: Right  12 lbs, Left: 11 lbs  COORDINATION: 9 Hole Peg test: Right: 38 sec; Left: 24 sec  SENSATION: Light touch: Impaired , bilat hands along median nerve distribution (pt reports worst pain and numbness is in her IF and LF of each hand)  EDEMA: Enlarged joints of the fingers r/t RA  COGNITION: Overall cognitive status: Within functional limits for tasks assessed  OBSERVATIONS: +Tinel's bilaterally, + Phalen's bilaterally  TODAY'S TREATMENT:        Iontophoresis: tx time: 20 min.  R/L volar palms (carpal tunnel), 1.5 cc Dexamethasone on the R, 1.5 cc Dexamethasone on the L; 40 mA/min, current amplitude 2.0 mA.  Skin check completed following tx.  Good tolerance to tx.                                                                                                                      Therapeutic Exercise: Instructed pt in bilat wrist strengthening exercises, light resistance with a 1 lb dumbbell to complete wrist flex, ext, RD/UD, and forearm pron/sup x2 sets 10 reps each.  Min vc for form and technique.  Handout issued and reviewed with patient.  Encouraged completion at home throughout the week.    PATIENT EDUCATION: Education details: HEP progression with wrist strengthening Person educated: Patient Education method: Explanation, demo, handout issued Education comprehension: verbalized understanding and needs further education  HOME EXERCISE PROGRAM: [place] theraputty  GOALS: Goals reviewed with patient? Yes  SHORT TERM GOALS: Target date: 06/10/22 (3 weeks)  Pt will be indep to perform HEP for improving bilat hand flexibility, strength, and coordination. Baseline: Eval: not yet initiated Goal status: INITIAL   2.  Pt will be indep to verbalize and demo 2-3 joint protection strategies and/or pieces of AE to reduce strain in hands during daily tasks.  Baseline: Eval: Educated not yet initiated Goal status: INITIAL     LONG TERM GOALS: Target date:  07/01/22 (6 weeks)   Pt  will increase FOTO score to 66 or better to indicate clinically relevant self perceived improvement in performance of daily tasks.  Baseline: Eval: 63; predicted 66 Goal status: INITIAL    2.  Pt will increase bilat grip strength by 5 or more lbs to improve ability to securely hold a grocery bag in either hand. Baseline: Eval: R 37#, L 40# Goal status: INITIAL   3.  Pt will tolerate manual therapy, therapeutic modalities, splinting, and/or exercises to decrease pain in bilat hands to a reported 3/10 pain or less with activity or at night time.. Baseline: Eval: 7-8/10 pain in bilat hands with activity or night time Goal status: INITIAL    4.  Pt will improve FMC/dexterity skills in bilat hands to be able to manage anterior clothing fasteners. Baseline: Eval: R 9 hole: 38 sec; L 24 sec; pt avoids clothing with fasteners Goal status: INITIAL  ASSESSMENT:  CLINICAL IMPRESSION: Pt reports no significant changes in her symptoms despite increased use of her wrist braces.  Initiated first iontophoresis tx this date to bilat volar palms over the carpal tunnel.  Skin check performed; good tolerance.  Plan to try at least 3 more iontophoresis treatments for relief of carpal tunnel symptoms.  Initiated light resistance exercises for bilat wrist and forearm strengthening; able to perform with min vc for technique and form.  Handout issued for carry over at home.  Pt will benefit from continued skilled OT for instruction in HEP progression for improving strength, coordination, and flexibility in bilat hands, to provide education on joint protection/cumulative trauma prevention strategies, ADL and AE training, and splinting/modalities as needed for pain management in bilat hands.  Pt in agreement with poc.   PERFORMANCE DEFICITS: in functional skills including ADLs, IADLs, coordination, dexterity, sensation, ROM, strength, pain, flexibility, Fine motor control, body mechanics, decreased knowledge of  precautions, decreased knowledge of use of DME, and UE functional use, and psychosocial skills including environmental adaptation, habits, and routines and behaviors.   IMPAIRMENTS: are limiting patient from ADLs, IADLs, and rest and sleep.   COMORBIDITIES: has co-morbidities such as RA, obesity  that affects occupational performance. Patient will benefit from skilled OT to address above impairments and improve overall function.  MODIFICATION OR ASSISTANCE TO COMPLETE EVALUATION: No modification of tasks or assist necessary to complete an evaluation.  OT OCCUPATIONAL PROFILE AND HISTORY: Problem focused assessment: Including review of records relating to presenting problem.  CLINICAL DECISION MAKING: Moderate - several treatment options, min-mod task modification necessary  REHAB POTENTIAL: Fair ; (lives alone, multiple co morbidities )  EVALUATION COMPLEXITY: Moderate      PLAN:  OT FREQUENCY: 2x/week  OT DURATION: 6 weeks  PLANNED INTERVENTIONS: self care/ADL training, therapeutic exercise, therapeutic activity, manual therapy, passive range of motion, splinting, iontophoresis, paraffin, moist heat, cryotherapy, contrast bath, patient/family education, and DME and/or AE instructions  RECOMMENDED OTHER SERVICES: PT referral to address deficits related to cervical changes, decreased mobility  CONSULTED AND AGREED WITH PLAN OF CARE: Patient  PLAN FOR NEXT SESSION: see plan  Leta Speller, MS, OTR/L  Darleene Cleaver, OT 06/11/2022, 4:35 PM

## 2022-06-13 ENCOUNTER — Ambulatory Visit: Payer: Medicare PPO | Admitting: Occupational Therapy

## 2022-06-13 DIAGNOSIS — M6281 Muscle weakness (generalized): Secondary | ICD-10-CM

## 2022-06-13 DIAGNOSIS — R202 Paresthesia of skin: Secondary | ICD-10-CM | POA: Diagnosis not present

## 2022-06-13 NOTE — Therapy (Signed)
OUTPATIENT OCCUPATIONAL THERAPY ORTHO TREATMENT  Patient Name: Bonnie Hinton MRN: [id number] DOB:Apr 14, 1940, 82 y.o., female Today's Date: 06/13/2022  PCP: Dr. Deborra Medina REFERRING PROVIDER: Dr. Farrel Gordon END OF SESSION:  OT End of Session - 06/13/22 1101     Visit Number 7    Number of Visits 12    Date for OT Re-Evaluation 07/01/22    OT Start Time 0845    OT Stop Time 0930    OT Time Calculation (min) 45 min    Activity Tolerance Patient tolerated treatment well    Behavior During Therapy [facility] for tasks assessed/performed             Past Medical History:  Diagnosis Date   Anxiety    Arthritis    Choledocholithiasis    Chronic back pain    stenosis and arthritis   Community acquired pneumonia [id number]   Complication of anesthesia    anxious when she wakes up   GERD (gastroesophageal reflux disease)    takes Pantoprazole daily   Glaucoma    dry angle   History of bronchitis    30+ yrs ago   History of MRSA infection 2004   several yrs ago   Hyperlipidemia    takes Zetia and Crestor daily   Hypertension    take Amlodipine daily   Hypothyroidism    takes Synthroid daily   Joint pain    Joint swelling    Neuropathy    takes Gabapentin daily   Pancreatitis, gallstone    Peripheral edema    takes Furosemide daily as needed   Pneumonia    hx of-30 + yrs ago   Pre-diabetes    takes Victoza daily   Primary localized osteoarthritis of right knee    Subdural hematoma ([place]) 8 yrs ago   hx of   Urinary frequency    Urinary urgency    Past Surgical History:  Procedure Laterality Date   ABDOMINAL HYSTERECTOMY     CHOLECYSTECTOMY N/A 09/09/2016   Procedure: LAPAROSCOPIC CHOLECYSTECTOMY;  Surgeon: Donnie Mesa, MD;  Location: [place];  Service: General;  Laterality: N/A;   COLONOSCOPY     RADIOLOGY WITH ANESTHESIA N/A 08/22/2015   Procedure: MRI OF LUMBAR SPINE   (RADIOLOGY WITH ANESTHESIA);  Surgeon: Medication Radiologist, MD;  Location:  [place];  Service: Radiology;  Laterality: N/A;   TOTAL HIP ARTHROPLASTY Right    TOTAL HIP ARTHROPLASTY Left    TOTAL KNEE ARTHROPLASTY Left    TOTAL KNEE ARTHROPLASTY Right 01/01/2016   Procedure: TOTAL KNEE ARTHROPLASTY;  Surgeon: Elsie Saas, MD;  Location: [place];  Service: Orthopedics;  Laterality: Right;   Patient Active Problem List   Diagnosis Date Noted   Hordeolum externum of left upper eyelid 02/20/2022   Polyarthritis 10/26/2021   Foot deformity, acquired, left 10/25/2021   Rash 10/25/2021   Osteopenia after menopause 10/25/2021   Pitting edema 10/25/2021   Mass of breast, left 08/08/2021   Walker as ambulation aid 07/23/2021   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   Numbness and tingling in right hand 04/25/2021   Mild protein-calorie malnutrition ([place]) 08/16/2020   OA (osteoarthritis) of finger, right 04/05/2020   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   Candidiasis of skin 06/28/2017   IBS (irritable bowel syndrome) 06/28/2017   Screening for colon cancer 12/28/2016   Dizziness 05/24/2016   Anxiety, generalized 02/20/2016   Arthritis    Acquired hypothyroidism    History of CVA (cerebrovascular accident)    Status post bilateral hip replacements  Status post right knee replacement    Degenerative lumbar spinal stenosis    Essential hypertension, benign 07/25/2015   Preoperative clearance 07/25/2015   Hyperlipidemia associated with type 2 diabetes mellitus ([place]) 07/25/2015   Mixed stress and urge urinary incontinence 04/26/2015   Drug-induced hyperkalemia 04/26/2015   Diabetic nephropathy associated with type 2 diabetes mellitus ([place]) 01/24/2015   Morbid obesity ([place]) 01/24/2015   Vitamin D deficiency 01/24/2015   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  No significant changes in her symptoms despite increased use of her wrist braces. Pt accompanied by: self  PERTINENT HISTORY: Per chart: [facility] 1. Bilateral hand paresthesias, carpal tunnel syndrome + generalized sensory motor peripheral polyneuropathy in a patient with severe Vitamin B12 deficiency + arthritis   We will set up Occupational Therapy for hands.  We reviewed patient's neuroimaging report and actual images with patient. X-ray cervical spine 01/29/2022: Severe arthritic changes at C6-7 and moderate a level   MRI C-Spine without contrast 02/11/2022: At C3-4 there is a mild broad-based disc bulge. Mild right facet arthropathy and right uncovertebral degenerative changes. Moderate right foraminal stenosis. At C4-5 there is a mild broad-based disc bulge. Bilateral uncovertebral degenerative changes. Moderate bilateral facet arthropathy. Moderate-severe right foraminal stenosis. Mild-moderate left foraminal stenosis. At C6-7 there is a broad-based disc bulge. Small right paracentral disc protrusion. Left uncovertebral degenerative changes. Severe left foraminal stenosis. No acute osseous injury of the cervical spine.   NCS upper extremity 04/17/2022: This is an abnormal electrodiagnostic study consistent with a 1) generalized severe sensorimotor polyneuropathy. 2) Can't rule out component of superimposed carpal tunnel syndrome.   PRECAUTIONS: None  WEIGHT BEARING RESTRICTIONS: No  PAIN: Pt reports no pain, but just tingling/numbness in the hands.    Eval: Are you having pain? Yes: NPRS scale: 6 at rest, 7-8 at night time/10 Pain location: bilat hands Pain description: stiff Aggravating factors: night time Relieving factors: nothing   FALLS: Has patient fallen in last 6 months? No  LIVING ENVIRONMENT: Lives with: lives alone Lives in: 1 level house  Stairs: none Has following  equipment at home: Gilford Rile - 4 wheeled, Wheelchair (manual), shower chair, Grab bars, and grab bars in shower and by toilet  PLOF: Independent, daughter lives in [place] about 20 min away  PATIENT GOALS: "I'd like to get my hands back to where they're not hurting."   NEXT MD VISIT:   OBJECTIVE:   HAND DOMINANCE: Right  ADLs: Overall ADLs: modified indep/extra time, mostly from wc level Transfers/ambulation related to ADLs: pt reports she mostly uses the wc at home; pt is trying to get back to using her walker and reports that she gets up with the walker every hour to walk a little  Eating: able to cut food  Grooming: indep UB Dressing: pt reports she does not wear a bra because she can't hook it  LB Dressing: wears elastic waist pants to avoid buttons/zippers; pt ties shoes loosely so she can slip them on Toileting: modified indep Bathing: modified indep Tub Shower transfers: modified indep  Equipment:  see above   FUNCTIONAL OUTCOME MEASURES: FOTO: 63; predicted 66  UPPER EXTREMITY ROM:     Active ROM Right eval Left eval  Elbow  flexion    Elbow extension    Wrist flexion 77 81  Wrist extension 46 58  Wrist ulnar deviation 52 40  Wrist radial deviation 11 22  Wrist pronation 90 90  Wrist supination 24 42  (Blank rows = not tested)  * Able to make full composite fist each hand and oppose all digits to thumb.   UPPER EXTREMITY MMT:     MMT Right eval Left eval  Elbow flexion    Elbow extension    Wrist flexion 4+ 4+  Wrist extension 4+ 4+  Wrist ulnar deviation    Wrist radial deviation    Wrist pronation    Wrist supination    (Blank rows = not tested)  HAND FUNCTION: Grip strength: Right: 37 lbs; Left: 40 lbs, Lateral pinch: Right: 18 lbs, Left: 9 lbs, 3 point pinch: Right: 21 lbs, Left: 17 lbs, and Tip pinch: Right 12 lbs, Left: 11 lbs  COORDINATION: 9 Hole Peg test: Right: 38 sec; Left: 24 sec  SENSATION: Light touch: Impaired , bilat hands  along median nerve distribution (pt reports worst pain and numbness is in her IF and LF of each hand)  EDEMA: Enlarged joints of the fingers r/t RA  COGNITION: Overall cognitive status: Within functional limits for tasks assessed  OBSERVATIONS: +Tinel's bilaterally, + Phalen's bilaterally  TODAY'S TREATMENT:         Iontophoresis: tx time: 20 min.   R/L volar palms (carpal tunnel), 1.5 cc Dexamethasone on the R, 1.5 cc Dexamethasone on the L; 40 mA/min, current amplitude 2.0 mA.  Skin check completed following tx. Good tolerance  to the tx overall. Pt. presented with a mild skin response to the Dexamethasone on the right.                    PATIENT EDUCATION: Education details: HEP progression with wrist strengthening Person educated: Patient Education method: Explanation, demo, handout issued Education comprehension: verbalized understanding and needs further education  HOME EXERCISE PROGRAM: [place] theraputty  GOALS: Goals reviewed with patient? Yes  SHORT TERM GOALS: Target date: 06/10/22 (3 weeks)  Pt will be indep to perform HEP for improving bilat hand flexibility, strength, and coordination. Baseline: Eval: not yet initiated Goal status: INITIAL   2.  Pt will be indep to verbalize and demo 2-3 joint protection strategies and/or pieces of AE to reduce strain in hands during daily tasks.  Baseline: Eval: Educated not yet initiated Goal status: INITIAL     LONG TERM GOALS: Target date: 07/01/22 (6 weeks)   Pt will increase FOTO score to 66 or better to indicate clinically relevant self perceived improvement in performance of daily tasks.  Baseline: Eval: 63; predicted 66 Goal status: INITIAL    2.  Pt will increase bilat grip strength by 5 or more lbs to improve ability to securely hold a grocery bag in either hand. Baseline: Eval: R 37#, L 40# Goal status: INITIAL   3.  Pt will tolerate manual therapy, therapeutic modalities, splinting, and/or exercises to  decrease pain in bilat hands to a reported 3/10 pain or less with activity or at night time.. Baseline: Eval: 7-8/10 pain in bilat hands with activity or night time Goal status: INITIAL    4.  Pt will improve FMC/dexterity skills in bilat hands to be able to manage anterior clothing fasteners. Baseline: Eval: R 9 hole: 38 sec; L 24 sec; pt avoids clothing with fasteners Goal status: INITIAL  ASSESSMENT:  CLINICAL IMPRESSION:  Pt.  reports no significant changes in her symptoms despite increased use of her wrist braces.  Initiated first iontophoresis tx this date to bilat volar carpal tunnel region.  Skin check performed; good tolerance overall, however presented with a mild skin respond at the right wrist. Plan to try at least 2 more iontophoresis treatments for relief of carpal tunnel symptoms. Reviewed work simplification/joint protection strategies at home. Pt. reports that she is using soup cans to perform the strengthening exercises at home. Pt will benefit from continued skilled OT for instruction in HEP progression for improving strength, coordination, and flexibility in bilat hands, to provide education on joint protection/cumulative trauma prevention strategies, ADL and AE training, and splinting/modalities as needed for pain management in bilat hands.  Pt. continues to be in agreement with poc.   PERFORMANCE DEFICITS: in functional skills including ADLs, IADLs, coordination, dexterity, sensation, ROM, strength, pain, flexibility, Fine motor control, body mechanics, decreased knowledge of precautions, decreased knowledge of use of DME, and UE functional use, and psychosocial skills including environmental adaptation, habits, and routines and behaviors.   IMPAIRMENTS: are limiting patient from ADLs, IADLs, and rest and sleep.   COMORBIDITIES: has co-morbidities such as RA, obesity  that affects occupational performance. Patient will benefit from skilled OT to address above impairments and  improve overall function.  MODIFICATION OR ASSISTANCE TO COMPLETE EVALUATION: No modification of tasks or assist necessary to complete an evaluation.  OT OCCUPATIONAL PROFILE AND HISTORY: Problem focused assessment: Including review of records relating to presenting problem.  CLINICAL DECISION MAKING: Moderate - several treatment options, min-mod task modification necessary  REHAB POTENTIAL: Fair ; (lives alone, multiple co morbidities )  EVALUATION COMPLEXITY: Moderate      PLAN:  OT FREQUENCY: 2x/week  OT DURATION: 6 weeks  PLANNED INTERVENTIONS: self care/ADL training, therapeutic exercise, therapeutic activity, manual therapy, passive range of motion, splinting, iontophoresis, paraffin, moist heat, cryotherapy, contrast bath, patient/family education, and DME and/or AE instructions  RECOMMENDED OTHER SERVICES: PT referral to address deficits related to cervical changes, decreased mobility  CONSULTED AND AGREED WITH PLAN OF CARE: Patient  PLAN FOR NEXT SESSION: see plan  Leta Speller, MS, OTR/L  Harrel Carina, OT 06/13/2022, 3:26 PM

## 2022-06-18 ENCOUNTER — Ambulatory Visit: Payer: Medicare PPO

## 2022-06-18 ENCOUNTER — Telehealth: Payer: Self-pay | Admitting: Internal Medicine

## 2022-06-18 DIAGNOSIS — M6281 Muscle weakness (generalized): Secondary | ICD-10-CM | POA: Diagnosis not present

## 2022-06-18 DIAGNOSIS — R202 Paresthesia of skin: Secondary | ICD-10-CM | POA: Diagnosis not present

## 2022-06-18 DIAGNOSIS — M48061 Spinal stenosis, lumbar region without neurogenic claudication: Secondary | ICD-10-CM

## 2022-06-18 NOTE — Therapy (Signed)
OUTPATIENT OCCUPATIONAL THERAPY ORTHO TREATMENT  Patient Name: Bonnie Hinton MRN: [id number] DOB:[birth year], 82 y.o., female Today's Date: 06/18/2022  PCP: Dr. Deborra Medina REFERRING PROVIDER: Dr. Farrel Gordon END OF SESSION:  OT End of Session - 06/18/22 0847     Visit Number 8    Number of Visits 12    Date for OT Re-Evaluation 07/01/22    Progress Note Due on Visit 10    OT Start Time 0845    OT Stop Time 0930    OT Time Calculation (min) 45 min    Equipment Utilized During Treatment transport chair    Activity Tolerance Patient tolerated treatment well    Behavior During Therapy WFL for tasks assessed/performed             Past Medical History:  Diagnosis Date   Anxiety    Arthritis    Choledocholithiasis    Chronic back pain    stenosis and arthritis   Community acquired pneumonia [id number]   Complication of anesthesia    anxious when she wakes up   GERD (gastroesophageal reflux disease)    takes Pantoprazole daily   Glaucoma    dry angle   History of bronchitis    30+ yrs ago   History of MRSA infection 2004   several yrs ago   Hyperlipidemia    takes Zetia and Crestor daily   Hypertension    take Amlodipine daily   Hypothyroidism    takes Synthroid daily   Joint pain    Joint swelling    Neuropathy    takes Gabapentin daily   Pancreatitis, gallstone    Peripheral edema    takes Furosemide daily as needed   Pneumonia    hx of-30 + yrs ago   Pre-diabetes    takes Victoza daily   Primary localized osteoarthritis of right knee    Subdural hematoma ([place]) 8 yrs ago   hx of   Urinary frequency    Urinary urgency    Past Surgical History:  Procedure Laterality Date   ABDOMINAL HYSTERECTOMY     CHOLECYSTECTOMY N/A 09/09/2016   Procedure: LAPAROSCOPIC CHOLECYSTECTOMY;  Surgeon: Donnie Mesa, MD;  Location: [facility];  Service: General;  Laterality: N/A;   COLONOSCOPY     RADIOLOGY WITH ANESTHESIA N/A 08/22/2015   Procedure: MRI OF  LUMBAR SPINE   (RADIOLOGY WITH ANESTHESIA);  Surgeon: Medication Radiologist, MD;  Location: [facility];  Service: Radiology;  Laterality: N/A;   TOTAL HIP ARTHROPLASTY Right    TOTAL HIP ARTHROPLASTY Left    TOTAL KNEE ARTHROPLASTY Left    TOTAL KNEE ARTHROPLASTY Right 01/01/2016   Procedure: TOTAL KNEE ARTHROPLASTY;  Surgeon: Elsie Saas, MD;  Location: [facility];  Service: Orthopedics;  Laterality: Right;   Patient Active Problem List   Diagnosis Date Noted   Hordeolum externum of left upper eyelid 02/20/2022   Polyarthritis 10/26/2021   Foot deformity, acquired, left 10/25/2021   Rash 10/25/2021   Osteopenia after menopause 10/25/2021   Pitting edema 10/25/2021   Mass of breast, left 08/08/2021   Walker as ambulation aid 07/23/2021   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   Numbness and tingling in right hand 04/25/2021   Mild protein-calorie malnutrition ([place]) 08/16/2020   OA (osteoarthritis) of finger, right 04/05/2020   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   Candidiasis of skin 06/28/2017   IBS (irritable bowel syndrome) 06/28/2017   Screening for colon cancer 12/28/2016   Dizziness 05/24/2016   Anxiety, generalized 02/20/2016   Arthritis  Acquired hypothyroidism    History of CVA (cerebrovascular accident)    Status post bilateral hip replacements    Status post right knee replacement    Degenerative lumbar spinal stenosis    Essential hypertension, benign 07/25/2015   Preoperative clearance 07/25/2015   Hyperlipidemia associated with type 2 diabetes mellitus ([place]) 07/25/2015   Mixed stress and urge urinary incontinence 04/26/2015   Drug-induced hyperkalemia 04/26/2015   Diabetic nephropathy associated with type 2 diabetes mellitus ([place]) 01/24/2015   Morbid obesity ([place]) 01/24/2015   Vitamin D deficiency 01/24/2015   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  Pt accompanied by: self  PERTINENT HISTORY: Per chart: [facility] 1. Bilateral hand paresthesias, carpal tunnel syndrome + generalized sensory motor peripheral polyneuropathy in a patient with severe Vitamin B12 deficiency + arthritis   We will set up Occupational Therapy for hands.  We reviewed patient's neuroimaging report and actual images with patient. X-ray cervical spine 01/29/2022: Severe arthritic changes at C6-7 and moderate a level   MRI C-Spine without contrast 02/11/2022: At C3-4 there is a mild broad-based disc bulge. Mild right facet arthropathy and right uncovertebral degenerative changes. Moderate right foraminal stenosis. At C4-5 there is a mild broad-based disc bulge. Bilateral uncovertebral degenerative changes. Moderate bilateral facet arthropathy. Moderate-severe right foraminal stenosis. Mild-moderate left foraminal stenosis. At C6-7 there is a broad-based disc bulge. Small right paracentral disc protrusion. Left uncovertebral degenerative changes. Severe left foraminal stenosis. No acute osseous injury of the cervical spine.   NCS upper extremity 04/17/2022: This is an abnormal electrodiagnostic study consistent with a 1) generalized severe sensorimotor polyneuropathy. 2) Can't rule out component of superimposed carpal tunnel syndrome.   PRECAUTIONS: None  WEIGHT BEARING RESTRICTIONS: No  PAIN: Pt reports no pain, but just tingling/numbness in the hands.    Eval: Are you having pain? Yes: NPRS scale: 6 at rest, 7-8 at night time/10 Pain location: bilat hands Pain description:  stiff Aggravating factors: night time Relieving factors: nothing   FALLS: Has patient fallen in last 6 months? No  LIVING ENVIRONMENT: Lives with: lives alone Lives in: 1 level house  Stairs: none Has following equipment at home: Gilford Rile - 4 wheeled, Wheelchair (manual), shower chair, Grab bars, and grab bars in shower and by toilet  PLOF: Independent, daughter lives in [place] about 20 min away  PATIENT GOALS: "I'd like to get my hands back to where they're not hurting."   NEXT MD VISIT:   OBJECTIVE:   HAND DOMINANCE: Right  ADLs: Overall ADLs: modified indep/extra time, mostly from wc level Transfers/ambulation related to ADLs: pt reports she mostly uses the wc at home; pt is trying to get back to using her walker and reports that she gets up with the walker every hour to walk a little  Eating: able to cut food  Grooming: indep UB Dressing: pt reports she does not wear a bra because she can't hook it  LB Dressing: wears elastic waist pants to avoid buttons/zippers; pt ties shoes loosely so she can slip them on  Toileting: modified indep Bathing: modified indep Tub Shower transfers: modified indep  Equipment:  see above   FUNCTIONAL OUTCOME MEASURES: FOTO: 63; predicted 66  UPPER EXTREMITY ROM:     Active ROM Right eval Left eval  Elbow flexion    Elbow extension    Wrist flexion 77 81  Wrist extension 46 58  Wrist ulnar deviation 52 40  Wrist radial deviation 11 22  Wrist pronation 90 90  Wrist supination 24 42  (Blank rows = not tested)  * Able to make full composite fist each hand and oppose all digits to thumb.   UPPER EXTREMITY MMT:     MMT Right eval Left eval  Elbow flexion    Elbow extension    Wrist flexion 4+ 4+  Wrist extension 4+ 4+  Wrist ulnar deviation    Wrist radial deviation    Wrist pronation    Wrist supination    (Blank rows = not tested)  HAND FUNCTION: Grip strength: Right: 37 lbs; Left: 40 lbs, Lateral pinch: Right: 18  lbs, Left: 9 lbs, 3 point pinch: Right: 21 lbs, Left: 17 lbs, and Tip pinch: Right 12 lbs, Left: 11 lbs  COORDINATION: 9 Hole Peg test: Right: 38 sec; Left: 24 sec  SENSATION: Light touch: Impaired , bilat hands along median nerve distribution (pt reports worst pain and numbness is in her IF and LF of each hand)  EDEMA: Enlarged joints of the fingers r/t RA  COGNITION: Overall cognitive status: Within functional limits for tasks assessed  OBSERVATIONS: +Tinel's bilaterally, + Phalen's bilaterally  TODAY'S TREATMENT:    Iontophoresis: tx time: 20 min. R/L volar palms (carpal tunnel), 1.5 cc Dexamethasone on the R, 1.5 cc Dexamethasone on the L; 40 mA/min, current amplitude 2.0 mA.  Skin check completed following tx. Good tolerance  to the tx overall.   Therapeutic Exercise: Performed passive stretching R/L for wrist flex/ext, forearm sup, elbow extension, thenar eminence in each hand, working to increase flexibility in BUEs to better perform proximal and distal medium nerve stretches.  Assisted pt through passive proximal and distal median nerve glides R/L; passive assist needed to maximize form and ROM in each step of the sequence.  PATIENT EDUCATION: Education details: Review of joint protection and cumulative trauma prevention strategies. Person educated: Patient Education method: Explanation, vc Education comprehension: verbalized understanding and needs further education  HOME EXERCISE PROGRAM: [place] theraputty, median nerve glides  GOALS: Goals reviewed with patient? Yes  SHORT TERM GOALS: Target date: 06/10/22 (3 weeks)  Pt will be indep to perform HEP for improving bilat hand flexibility, strength, and coordination. Baseline: Eval: not yet initiated Goal status: INITIAL   2.  Pt will be indep to verbalize and demo 2-3 joint protection strategies and/or pieces of AE to reduce strain in hands during daily tasks.  Baseline: Eval: Educated not yet initiated Goal  status: INITIAL     LONG TERM GOALS: Target date: 07/01/22 (6 weeks)   Pt will increase FOTO score to 66 or better to indicate clinically relevant self perceived improvement in performance of daily tasks.  Baseline: Eval: 63; predicted 66 Goal status: INITIAL    2.  Pt will increase bilat grip strength by 5 or more lbs to improve ability to securely hold a grocery bag in either hand. Baseline: Eval: R 37#, L 40# Goal status: INITIAL   3.  Pt will tolerate manual therapy, therapeutic modalities, splinting, and/or exercises to decrease pain in bilat hands to a reported 3/10 pain  or less with activity or at night time.. Baseline: Eval: 7-8/10 pain in bilat hands with activity or night time Goal status: INITIAL    4.  Pt will improve FMC/dexterity skills in bilat hands to be able to manage anterior clothing fasteners. Baseline: Eval: R 9 hole: 38 sec; L 24 sec; pt avoids clothing with fasteners Goal status: INITIAL  ASSESSMENT:  CLINICAL IMPRESSION: Pt reports that she's been wearing her carpal tunnel braces consistently during the day, working on her exercises, and icing occasionally but seeing no significant change in her paresthesias.  Pt completed 3rd iontophoresis tx this date to bilat volar carpal tunnel region.  Skin check performed; good tolerance overall. Plan to try at least 1 more iontophoresis treatment for relief of carpal tunnel symptoms.  Reviewed work simplification/joint protection strategies at home and pt feels like she is following strategies well. Anticipate d/c next session if iontophoresis treatments have not proved to be effective for symptom management.  Will plan to request referral to PT to eval and tx for cervical issues.  Pt in agreement with plan.  PERFORMANCE DEFICITS: in functional skills including ADLs, IADLs, coordination, dexterity, sensation, ROM, strength, pain, flexibility, Fine motor control, body mechanics, decreased knowledge of precautions, decreased  knowledge of use of DME, and UE functional use, and psychosocial skills including environmental adaptation, habits, and routines and behaviors.   IMPAIRMENTS: are limiting patient from ADLs, IADLs, and rest and sleep.   COMORBIDITIES: has co-morbidities such as RA, obesity  that affects occupational performance. Patient will benefit from skilled OT to address above impairments and improve overall function.  MODIFICATION OR ASSISTANCE TO COMPLETE EVALUATION: No modification of tasks or assist necessary to complete an evaluation.  OT OCCUPATIONAL PROFILE AND HISTORY: Problem focused assessment: Including review of records relating to presenting problem.  CLINICAL DECISION MAKING: Moderate - several treatment options, min-mod task modification necessary  REHAB POTENTIAL: Fair ; (lives alone, multiple co morbidities )  EVALUATION COMPLEXITY: Moderate      PLAN:  OT FREQUENCY: 2x/week  OT DURATION: 6 weeks  PLANNED INTERVENTIONS: self care/ADL training, therapeutic exercise, therapeutic activity, manual therapy, passive range of motion, splinting, iontophoresis, paraffin, moist heat, cryotherapy, contrast bath, patient/family education, and DME and/or AE instructions  RECOMMENDED OTHER SERVICES: PT referral to address deficits related to cervical changes, decreased mobility  CONSULTED AND AGREED WITH PLAN OF CARE: Patient  PLAN FOR NEXT SESSION: see plan  Leta Speller, MS, OTR/L  Darleene Cleaver, OT 06/18/2022, 8:50 AM

## 2022-06-18 NOTE — Telephone Encounter (Signed)
Yes I can sign

## 2022-06-18 NOTE — Telephone Encounter (Signed)
Spoke with pt to let her know that order has been signed. Pt stated that her daughter will come by to pick it up tomorrow.

## 2022-06-18 NOTE — Telephone Encounter (Signed)
Faxed to number provided below.

## 2022-06-18 NOTE — Telephone Encounter (Signed)
Pt called leaving a fax number to [employer] [phone]

## 2022-06-18 NOTE — Telephone Encounter (Signed)
Pt stated that her wheelchair is broken and unable to sit in it at all. Pt is wanting to see if she can get this DME faxed over to medical supply store today so her daughter can pick up tomorrow when she comes into town. Would you be able to sign off on the DME order I have pended.

## 2022-06-18 NOTE — Telephone Encounter (Signed)
Bonnie Hinton from [employer] called stating pt need a new wheelchair because the seat is detaching from the wheel chair. [employer] need a durable medical equipment authorization from the provider

## 2022-06-19 DIAGNOSIS — Z7409 Other reduced mobility: Secondary | ICD-10-CM | POA: Insufficient documentation

## 2022-06-19 NOTE — Telephone Encounter (Signed)
[employer] called in staying that they will fax back the wheelchair request due to the progress note not been filled by provider.

## 2022-06-19 NOTE — Telephone Encounter (Signed)
Re-faxed with office note

## 2022-06-19 NOTE — Telephone Encounter (Signed)
Last in office note needs to be amended to state the need for patient needing a wheelchair.

## 2022-06-19 NOTE — Addendum Note (Signed)
Addended by: Crecencio Mc on: 06/19/2022 01:00 PM   Modules accepted: Orders

## 2022-06-19 NOTE — Assessment & Plan Note (Signed)
Secondary to morbid obesity,  severe DJD knees,  and foot deformity.  New wheelchair needed

## 2022-06-20 ENCOUNTER — Ambulatory Visit: Payer: Medicare PPO

## 2022-06-20 DIAGNOSIS — R202 Paresthesia of skin: Secondary | ICD-10-CM | POA: Diagnosis not present

## 2022-06-20 DIAGNOSIS — M6281 Muscle weakness (generalized): Secondary | ICD-10-CM

## 2022-06-20 NOTE — Therapy (Signed)
OUTPATIENT OCCUPATIONAL THERAPY ORTHO TREATMENT  Patient Name: Bonnie Hinton MRN: [id number] DOB:06-11-1940, 82 y.o., female Today's Date: 06/20/2022  PCP: Dr. Deborra Medina REFERRING PROVIDER: Dr. Farrel Gordon END OF SESSION:  OT End of Session - 06/20/22 1541     Visit Number 9    Number of Visits 12    Date for OT Re-Evaluation 07/01/22    Progress Note Due on Visit 10    OT Start Time 0830    OT Stop Time 0920    OT Time Calculation (min) 50 min    Equipment Utilized During Treatment transport chair    Activity Tolerance Patient tolerated treatment well    Behavior During Therapy WFL for tasks assessed/performed             Past Medical History:  Diagnosis Date   Anxiety    Arthritis    Choledocholithiasis    Chronic back pain    stenosis and arthritis   Community acquired pneumonia [id number]   Complication of anesthesia    anxious when she wakes up   GERD (gastroesophageal reflux disease)    takes Pantoprazole daily   Glaucoma    dry angle   History of bronchitis    30+ yrs ago   History of MRSA infection 2004   several yrs ago   Hyperlipidemia    takes Zetia and Crestor daily   Hypertension    take Amlodipine daily   Hypothyroidism    takes Synthroid daily   Joint pain    Joint swelling    Neuropathy    takes Gabapentin daily   Pancreatitis, gallstone    Peripheral edema    takes Furosemide daily as needed   Pneumonia    hx of-30 + yrs ago   Pre-diabetes    takes Victoza daily   Primary localized osteoarthritis of right knee    Subdural hematoma ([place]) 8 yrs ago   hx of   Urinary frequency    Urinary urgency    Past Surgical History:  Procedure Laterality Date   ABDOMINAL HYSTERECTOMY     CHOLECYSTECTOMY N/A 09/09/2016   Procedure: LAPAROSCOPIC CHOLECYSTECTOMY;  Surgeon: Donnie Mesa, MD;  Location: [facility];  Service: General;  Laterality: N/A;   COLONOSCOPY     RADIOLOGY WITH ANESTHESIA N/A 08/22/2015   Procedure: MRI OF  LUMBAR SPINE   (RADIOLOGY WITH ANESTHESIA);  Surgeon: Medication Radiologist, MD;  Location: [facility];  Service: Radiology;  Laterality: N/A;   TOTAL HIP ARTHROPLASTY Right    TOTAL HIP ARTHROPLASTY Left    TOTAL KNEE ARTHROPLASTY Left    TOTAL KNEE ARTHROPLASTY Right 01/01/2016   Procedure: TOTAL KNEE ARTHROPLASTY;  Surgeon: Elsie Saas, MD;  Location: [facility];  Service: Orthopedics;  Laterality: Right;   Patient Active Problem List   Diagnosis Date Noted   Mobility impaired 06/19/2022   Hordeolum externum of left upper eyelid 02/20/2022   Polyarthritis 10/26/2021   Foot deformity, acquired, left 10/25/2021   Rash 10/25/2021   Osteopenia after menopause 10/25/2021   Pitting edema 10/25/2021   Mass of breast, left 08/08/2021   Walker as ambulation aid 07/23/2021   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   Numbness and tingling in right hand 04/25/2021   Mild protein-calorie malnutrition ([place]) 08/16/2020   OA (osteoarthritis) of finger, right 04/05/2020   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   Candidiasis of skin 06/28/2017   IBS (irritable bowel syndrome) 06/28/2017   Screening for colon cancer 12/28/2016   Dizziness 05/24/2016   Anxiety, generalized 02/20/2016  Arthritis    Acquired hypothyroidism    History of CVA (cerebrovascular accident)    Status post bilateral hip replacements    Status post right knee replacement    Degenerative lumbar spinal stenosis    Essential hypertension, benign 07/25/2015   Preoperative clearance 07/25/2015   Hyperlipidemia associated with type 2 diabetes mellitus ([place]) 07/25/2015   Mixed stress and urge urinary incontinence 04/26/2015   Drug-induced hyperkalemia 04/26/2015   Diabetic nephropathy associated with type 2 diabetes mellitus ([place]) 01/24/2015   Morbid obesity ([place]) 01/24/2015   Vitamin D deficiency 01/24/2015   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  Pt indicated improvements along the ulnar nerve distribution in each hand, not necessarily the median nerve distribution.   Pt accompanied by: self  PERTINENT HISTORY: Per chart: [facility] 1. Bilateral hand paresthesias, carpal tunnel syndrome + generalized sensory motor peripheral polyneuropathy in a patient with severe Vitamin B12 deficiency + arthritis   We will set up Occupational Therapy for hands.  We reviewed patient's neuroimaging report and actual images with patient. X-ray cervical spine 01/29/2022: Severe arthritic changes at C6-7 and moderate a level   MRI C-Spine without contrast 02/11/2022: At C3-4 there is a mild broad-based disc bulge. Mild right facet arthropathy and right uncovertebral degenerative changes. Moderate right foraminal stenosis. At C4-5 there is a mild broad-based disc bulge. Bilateral uncovertebral degenerative changes. Moderate bilateral facet arthropathy. Moderate-severe right foraminal stenosis. Mild-moderate left foraminal stenosis. At C6-7 there is a broad-based disc bulge. Small right paracentral disc protrusion. Left uncovertebral degenerative changes. Severe left foraminal stenosis. No acute osseous injury of the cervical spine.   NCS upper extremity 04/17/2022: This is an abnormal electrodiagnostic study consistent with a 1) generalized severe sensorimotor polyneuropathy. 2) Can't rule out component of superimposed carpal tunnel syndrome.   PRECAUTIONS: None  WEIGHT BEARING RESTRICTIONS: No  PAIN: Pt reports no pain, but just tingling/numbness in the hands.    Eval: Are you having pain? Yes: NPRS scale: 6 at rest, 7-8 at night time/10 Pain  location: bilat hands Pain description: stiff Aggravating factors: night time Relieving factors: nothing   FALLS: Has patient fallen in last 6 months? No  LIVING ENVIRONMENT: Lives with: lives alone Lives in: 1 level house  Stairs: none Has following equipment at home: Gilford Rile - 4 wheeled, Wheelchair (manual), shower chair, Grab bars, and grab bars in shower and by toilet  PLOF: Independent, daughter lives in [place] about 20 min away  PATIENT GOALS: "I'd like to get my hands back to where they're not hurting."   NEXT MD VISIT:   OBJECTIVE:   HAND DOMINANCE: Right  ADLs: Overall ADLs: modified indep/extra time, mostly from wc level Transfers/ambulation related to ADLs: pt reports she mostly uses the wc at home; pt is trying to get back to using her walker and reports that she gets up with the walker every hour to walk a little  Eating: able to cut food  Grooming: indep UB Dressing: pt reports she does not wear a bra because she can't hook it  LB Dressing: wears elastic waist pants to avoid buttons/zippers; pt  ties shoes loosely so she can slip them on Toileting: modified indep Bathing: modified indep Tub Shower transfers: modified indep  Equipment:  see above   FUNCTIONAL OUTCOME MEASURES: FOTO: 63; predicted 66  UPPER EXTREMITY ROM:     Active ROM Right eval Left eval  Elbow flexion    Elbow extension    Wrist flexion 77 81  Wrist extension 46 58  Wrist ulnar deviation 52 40  Wrist radial deviation 11 22  Wrist pronation 90 90  Wrist supination 24 42  (Blank rows = not tested)  * Able to make full composite fist each hand and oppose all digits to thumb.   UPPER EXTREMITY MMT:     MMT Right eval Left eval  Elbow flexion    Elbow extension    Wrist flexion 4+ 4+  Wrist extension 4+ 4+  Wrist ulnar deviation    Wrist radial deviation    Wrist pronation    Wrist supination    (Blank rows = not tested)  HAND FUNCTION: Grip strength: Right: 37  lbs; Left: 40 lbs, Lateral pinch: Right: 18 lbs, Left: 9 lbs, 3 point pinch: Right: 21 lbs, Left: 17 lbs, and Tip pinch: Right 12 lbs, Left: 11 lbs  COORDINATION: 9 Hole Peg test: Right: 38 sec; Left: 24 sec  SENSATION: Light touch: Impaired , bilat hands along median nerve distribution (pt reports worst pain and numbness is in her IF and LF of each hand)  EDEMA: Enlarged joints of the fingers r/t RA  COGNITION: Overall cognitive status: Within functional limits for tasks assessed  OBSERVATIONS: +Tinel's bilaterally, + Phalen's bilaterally  TODAY'S TREATMENT:    Iontophoresis: tx time: 20 min. R/L volar palms (carpal tunnel), 1.5 cc Dexamethasone on the R, 1.5 cc Dexamethasone on the L; 40 mA/min, current amplitude 2.0 mA.  Skin check completed following tx. Good tolerance to the tx overall.   Manual Therapy: Performed soft tissue massage on the volar surfaces of each palm and carpal tunnel region, PIPs of each digit in each hand (arthritic), volar and dorsal elbows bilaterally, and biceps/triceps insertion of each arm, working to increase circulation for pain reduction, decrease edema, and increase tissue mobility for better range and tolerance to daily tasks.   Therapeutic Exercise: Performed passive stretching R/L for wrist flex/ext, forearm sup, elbow extension, thenar eminence in each hand, working to increase flexibility in BUEs to better perform proximal and distal medium nerve stretches.  Assisted pt through passive proximal and distal median nerve glides R/L; passive assist needed to maximize form and ROM in each step of the sequence.  PATIENT EDUCATION: Education details: continue to wear carpal tunnel braces with activity to stabilize wrists Person educated: Patient Education method: Explanation, vc Education comprehension: verbalized understanding and needs further education  HOME EXERCISE PROGRAM: [place] theraputty, median nerve glides  GOALS: Goals reviewed with  patient? Yes  SHORT TERM GOALS: Target date: 06/10/22 (3 weeks)  Pt will be indep to perform HEP for improving bilat hand flexibility, strength, and coordination. Baseline: Eval: not yet initiated Goal status: INITIAL   2.  Pt will be indep to verbalize and demo 2-3 joint protection strategies and/or pieces of AE to reduce strain in hands during daily tasks.  Baseline: Eval: Educated not yet initiated Goal status: INITIAL     LONG TERM GOALS: Target date: 07/01/22 (6 weeks)   Pt will increase FOTO score to 66 or better to indicate clinically relevant self perceived improvement in performance of daily tasks.  Baseline:  Eval: 63; predicted 66 Goal status: INITIAL    2.  Pt will increase bilat grip strength by 5 or more lbs to improve ability to securely hold a grocery bag in either hand. Baseline: Eval: R 37#, L 40# Goal status: INITIAL   3.  Pt will tolerate manual therapy, therapeutic modalities, splinting, and/or exercises to decrease pain in bilat hands to a reported 3/10 pain or less with activity or at night time.. Baseline: Eval: 7-8/10 pain in bilat hands with activity or night time Goal status: INITIAL    4.  Pt will improve FMC/dexterity skills in bilat hands to be able to manage anterior clothing fasteners. Baseline: Eval: R 9 hole: 38 sec; L 24 sec; pt avoids clothing with fasteners Goal status: INITIAL  ASSESSMENT:  CLINICAL IMPRESSION: Pt reports that last night she realized that her numbness was better in her hands.  Pt indicated improvements  (less tingling) along the ulnar nerve distribution in each hand, not necessarily the median nerve distribution.  Pt reports that she tried to wear her carpal tunnel braces more than normal yesterday, and she feels like the stretching during OT sessions is helping to improve her symptoms.  Pt completed 4th iontophoresis tx this date to bilat volar carpal tunnel region.  Skin check performed; good tolerance overall.  Had planned to d/c  this session if no improvement in symptoms, but pt reports that she noted improvement in her hands last night and would like to continue to work towards goals in [place].  Pt tolerated all therapeutic exercises and manual therapy well this date.  Will continue per plan to work towards improved pain and function in the hands for daily tasks.   PERFORMANCE DEFICITS: in functional skills including ADLs, IADLs, coordination, dexterity, sensation, ROM, strength, pain, flexibility, Fine motor control, body mechanics, decreased knowledge of precautions, decreased knowledge of use of DME, and UE functional use, and psychosocial skills including environmental adaptation, habits, and routines and behaviors.   IMPAIRMENTS: are limiting patient from ADLs, IADLs, and rest and sleep.   COMORBIDITIES: has co-morbidities such as RA, obesity  that affects occupational performance. Patient will benefit from skilled OT to address above impairments and improve overall function.  MODIFICATION OR ASSISTANCE TO COMPLETE EVALUATION: No modification of tasks or assist necessary to complete an evaluation.  OT OCCUPATIONAL PROFILE AND HISTORY: Problem focused assessment: Including review of records relating to presenting problem.  CLINICAL DECISION MAKING: Moderate - several treatment options, min-mod task modification necessary  REHAB POTENTIAL: Fair ; (lives alone, multiple co morbidities )  EVALUATION COMPLEXITY: Moderate      PLAN:  OT FREQUENCY: 2x/week  OT DURATION: 6 weeks  PLANNED INTERVENTIONS: self care/ADL training, therapeutic exercise, therapeutic activity, manual therapy, passive range of motion, splinting, iontophoresis, paraffin, moist heat, cryotherapy, contrast bath, patient/family education, and DME and/or AE instructions  RECOMMENDED OTHER SERVICES: PT referral to address deficits related to cervical changes, decreased mobility  CONSULTED AND AGREED WITH PLAN OF CARE: Patient  PLAN FOR NEXT  SESSION: see plan  Leta Speller, MS, OTR/L  Darleene Cleaver, OT 06/20/2022, 3:44 PM

## 2022-06-24 NOTE — Telephone Encounter (Signed)
Patient called and stated that she really needs her wheelchair. Patient is not sure how much longer her wheelchair seat will hold together.

## 2022-06-25 ENCOUNTER — Ambulatory Visit: Payer: Medicare PPO | Attending: Internal Medicine

## 2022-06-25 DIAGNOSIS — M25642 Stiffness of left hand, not elsewhere classified: Secondary | ICD-10-CM | POA: Insufficient documentation

## 2022-06-25 DIAGNOSIS — R202 Paresthesia of skin: Secondary | ICD-10-CM | POA: Diagnosis not present

## 2022-06-25 DIAGNOSIS — M25641 Stiffness of right hand, not elsewhere classified: Secondary | ICD-10-CM | POA: Insufficient documentation

## 2022-06-25 DIAGNOSIS — M6281 Muscle weakness (generalized): Secondary | ICD-10-CM | POA: Insufficient documentation

## 2022-06-25 NOTE — Therapy (Signed)
OUTPATIENT OCCUPATIONAL THERAPY ORTHO PROGRESS AND DISCHARGE NOTE Reporting period beginning 05/20/22-06/25/22  Patient Name: Bonnie Hinton MRN: [id number] DOB:10/15/1940, 82 y.o., female Today's Date: 06/25/2022  PCP: Dr. Deborra Medina REFERRING PROVIDER: Dr. Farrel Gordon END OF SESSION:  OT End of Session - 06/25/22 0849     Visit Number 10    Number of Visits 12    Date for OT Re-Evaluation 07/01/22    Authorization Time Period Reporting period beginning 05/20/22-06/25/22    Progress Note Due on Visit 10    OT Start Time 0845    OT Stop Time 0930    OT Time Calculation (min) 45 min    Equipment Utilized During Treatment transport chair    Activity Tolerance Patient tolerated treatment well    Behavior During Therapy WFL for tasks assessed/performed             Past Medical History:  Diagnosis Date   Anxiety    Arthritis    Choledocholithiasis    Chronic back pain    stenosis and arthritis   Community acquired pneumonia [id number]   Complication of anesthesia    anxious when she wakes up   GERD (gastroesophageal reflux disease)    takes Pantoprazole daily   Glaucoma    dry angle   History of bronchitis    30+ yrs ago   History of MRSA infection 2004   several yrs ago   Hyperlipidemia    takes Zetia and Crestor daily   Hypertension    take Amlodipine daily   Hypothyroidism    takes Synthroid daily   Joint pain    Joint swelling    Neuropathy    takes Gabapentin daily   Pancreatitis, gallstone    Peripheral edema    takes Furosemide daily as needed   Pneumonia    hx of-30 + yrs ago   Pre-diabetes    takes Victoza daily   Primary localized osteoarthritis of right knee    Subdural hematoma ([place]) 8 yrs ago   hx of   Urinary frequency    Urinary urgency    Past Surgical History:  Procedure Laterality Date   ABDOMINAL HYSTERECTOMY     CHOLECYSTECTOMY N/A 09/09/2016   Procedure: LAPAROSCOPIC CHOLECYSTECTOMY;  Surgeon: Donnie Mesa, MD;   Location: [facility];  Service: General;  Laterality: N/A;   COLONOSCOPY     RADIOLOGY WITH ANESTHESIA N/A 08/22/2015   Procedure: MRI OF LUMBAR SPINE   (RADIOLOGY WITH ANESTHESIA);  Surgeon: Medication Radiologist, MD;  Location: [facility];  Service: Radiology;  Laterality: N/A;   TOTAL HIP ARTHROPLASTY Right    TOTAL HIP ARTHROPLASTY Left    TOTAL KNEE ARTHROPLASTY Left    TOTAL KNEE ARTHROPLASTY Right 01/01/2016   Procedure: TOTAL KNEE ARTHROPLASTY;  Surgeon: Elsie Saas, MD;  Location: [facility];  Service: Orthopedics;  Laterality: Right;   Patient Active Problem List   Diagnosis Date Noted   Mobility impaired 06/19/2022   Hordeolum externum of left upper eyelid 02/20/2022   Polyarthritis 10/26/2021   Foot deformity, acquired, left 10/25/2021   Rash 10/25/2021   Osteopenia after menopause 10/25/2021   Pitting edema 10/25/2021   Mass of breast, left 08/08/2021   Walker as ambulation aid 07/23/2021   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   Numbness and tingling in right hand 04/25/2021   Mild protein-calorie malnutrition 08/16/2020   OA (osteoarthritis) of finger, right 04/05/2020   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   Candidiasis of skin 06/28/2017   IBS (irritable bowel syndrome) 06/28/2017  Screening for colon cancer 12/28/2016   Dizziness 05/24/2016   Anxiety, generalized 02/20/2016   Arthritis    Acquired hypothyroidism    History of CVA (cerebrovascular accident)    Status post bilateral hip replacements    Status post right knee replacement    Degenerative lumbar spinal stenosis    Essential hypertension, benign 07/25/2015   Preoperative clearance 07/25/2015   Hyperlipidemia associated with type 2 diabetes mellitus 07/25/2015   Mixed stress and urge urinary incontinence 04/26/2015   Drug-induced hyperkalemia 04/26/2015   Diabetic nephropathy associated with type 2 diabetes mellitus 01/24/2015   Morbid obesity 01/24/2015   Vitamin D deficiency 01/24/2015   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 Pt accompanied by: self  PERTINENT HISTORY: Per chart: [facility] 1. Bilateral hand paresthesias, carpal tunnel syndrome + generalized sensory motor peripheral polyneuropathy in a patient with severe Vitamin B12 deficiency + arthritis   We will set up Occupational Therapy for hands.  We reviewed patient's neuroimaging report and actual images with patient. X-ray cervical spine 01/29/2022: Severe arthritic changes at C6-7 and moderate a level   MRI C-Spine without contrast 02/11/2022: At C3-4 there is a mild broad-based disc bulge. Mild right facet arthropathy and right uncovertebral degenerative changes. Moderate right foraminal stenosis. At C4-5 there is a mild broad-based disc bulge. Bilateral uncovertebral degenerative changes. Moderate bilateral facet arthropathy. Moderate-severe right foraminal stenosis. Mild-moderate left foraminal stenosis. At C6-7 there is a broad-based disc bulge. Small right paracentral disc protrusion. Left uncovertebral degenerative changes. Severe left foraminal stenosis. No acute osseous injury of the cervical spine.   NCS upper extremity 04/17/2022: This is an abnormal electrodiagnostic study consistent with a 1) generalized severe sensorimotor polyneuropathy. 2) Can't rule out component of superimposed carpal tunnel syndrome.   PRECAUTIONS: None  WEIGHT BEARING RESTRICTIONS: No  PAIN: 06/25/22: 8/10 pain in R IF and LF, 5/10 pain in L IF and LF, achy  Eval: Are you having pain? Yes: NPRS scale: 6 at rest, 7-8 at night time/10 Pain location: bilat hands Pain  description: stiff Aggravating factors: night time Relieving factors: nothing   FALLS: Has patient fallen in last 6 months? No  LIVING ENVIRONMENT: Lives with: lives alone Lives in: 1 level house  Stairs: none Has following equipment at home: Gilford Rile - 4 wheeled, Wheelchair (manual), shower chair, Grab bars, and grab bars in shower and by toilet  PLOF: Independent, daughter lives in [place] about 20 min away  PATIENT GOALS: "I'd like to get my hands back to where they're not hurting."   NEXT MD VISIT:   OBJECTIVE:   HAND DOMINANCE: Right  ADLs: Overall ADLs: modified indep/extra time, mostly from wc level Transfers/ambulation related to ADLs: pt reports she mostly uses the wc at home; pt is trying to get back to using her walker and reports that she gets up with the walker every hour to walk a little  Eating: able to cut food  Grooming: indep UB Dressing: pt reports she does not wear a bra because she can't hook it  LB Dressing: wears elastic waist pants to avoid buttons/zippers; pt ties shoes  loosely so she can slip them on Toileting: modified indep Bathing: modified indep Tub Shower transfers: modified indep  Equipment:  see above   FUNCTIONAL OUTCOME MEASURES: FOTO: 63; predicted 66 06/25/22: 50  UPPER EXTREMITY ROM:     Active ROM Right eval Right 06/25/22 Left eval Left 06/25/22  Elbow flexion      Elbow extension      Wrist flexion 77 78 81 83  Wrist extension 46 43 58 60  Wrist ulnar deviation 52 49 40 41  Wrist radial deviation 11 17 22 22   Wrist pronation 90 90 90 90  Wrist supination 24 27 42 40  (Blank rows = not tested)  * Able to make full composite fist each hand and oppose all digits to thumb.   UPPER EXTREMITY MMT:     MMT Right eval Right 06/25/22 Left eval Left 06/25/22  Elbow flexion      Elbow extension      Wrist flexion 4+ 4+ 4+ 4+  Wrist extension 4+ 4+ 4+ 4+  Wrist ulnar deviation      Wrist radial deviation      Wrist pronation       Wrist supination      (Blank rows = not tested)  HAND FUNCTION: Grip strength: Right: 37 lbs; Left: 40 lbs, Lateral pinch: Right: 18 lbs, Left: 9 lbs, 3 point pinch: Right: 21 lbs, Left: 17 lbs, and Tip pinch: Right 12 lbs, Left: 11 lbs 06/25/22: Grip strength: Right: 35 lbs; Left: 31 lbs, Lateral pinch: Right: 15 lbs, Left: 10 lbs, 3 point pinch: Right: 17 lbs, Left: 17 lbs, and Tip pinch: 11 Right  lbs, Left: 10  lbs  COORDINATION: 9 Hole Peg test: Right: 38 sec; Left: 24 sec 06/25/22: 9 Hole Peg test: Right: 33 sec; Left: 28 sec  SENSATION: Light touch: Impaired , bilat hands along median nerve distribution (pt reports worst pain and numbness is in her IF and LF of each hand)  EDEMA: Enlarged joints of the fingers r/t RA  COGNITION: Overall cognitive status: Within functional limits for tasks assessed  OBSERVATIONS: +Tinel's bilaterally, + Phalen's bilaterally  TODAY'S TREATMENT:    Iontophoresis: tx time: 20 min. R/L volar palms (carpal tunnel), 1.5 cc Dexamethasone on the R, 1.5 cc Dexamethasone on the L; 40 mA/min, current amplitude 2.0 mA.  Skin check completed following tx. Good tolerance to the tx overall.   Therapeutic Exercise: Objective measures taken and goals updated for progress/discharge note.  HEP reviewed.  PATIENT EDUCATION: Education details: continue to wear carpal tunnel braces with activity to stabilize wrists Person educated: Patient Education method: Explanation, vc Education comprehension: verbalized understanding and needs further education  HOME EXERCISE PROGRAM: [place] theraputty, median nerve glides  GOALS: Goals reviewed with patient? Yes  SHORT TERM GOALS: Target date: 06/10/22 (3 weeks)  Pt will be indep to perform HEP for improving bilat hand flexibility, strength, and coordination. Baseline: Eval: not yet initiated; 06/25/22: Pt reports she uses her putty 3x daily.  Pt has handouts for median nerve stretches and she performs them as best  she can within her available ROM.  Pt is indep with wrist strengthening exercises with light weight dumbbells. Goal status: achieved  2.  Pt will be indep to verbalize and demo 2-3 joint protection strategies and/or pieces of AE to reduce strain in hands during daily tasks.  Baseline: Eval: Educated not yet initiated; 06/25/22: indep with joint protection strategies. Goal status: achieved     LONG TERM GOALS:  Target date: 07/01/22 (6 weeks)   Pt will increase FOTO score to 66 or better to indicate clinically relevant self perceived improvement in performance of daily tasks.  Baseline: Eval: 63; predicted 66; 06/25/22: 50 Goal status: not met/declined    2.  Pt will increase bilat grip strength by 5 or more lbs to improve ability to securely hold a grocery bag in either hand. Baseline: Eval: R 37#, L 40#; 06/25/22: R 35#, L 31# Goal status: Not met/declined   3.  Pt will tolerate manual therapy, therapeutic modalities, splinting, and/or exercises to decrease pain in bilat hands to a reported 3/10 pain or less with activity or at night time.. Baseline: Eval: 7-8/10 pain in bilat hands with activity or night time; 06/25/22: Pt reports 8/10 pain in R hand IF and LF, and 5/10 pain in L IF and LF.  Pt reports no significant change in the tingling/numbness in either hand despite modalities, therapeutic exercises, and wearing carpal tunnel braces at home. Goal status: Not met    4.  Pt will improve FMC/dexterity skills in bilat hands to be able to manage anterior clothing fasteners. Baseline: Eval: R 9 hole: 38 sec; L 24 sec; pt avoids clothing with fasteners; 06/25/22: R 33 sec, L 28 sec (no significant change in management of clothing fasteners. Goal status: Not met  ASSESSMENT: CLINICAL IMPRESSION: Pt seen for 10th visit progress note and 5th treatment of iontophoresis.  Pt tolerated iontophoresis well this date.  After 10 OT sessions, pt's bilat hand strength has not improved, despite consistent  completion of her exercises, and R grip has actually decreased by 2 lbs, and decreased by 9 lbs on the L hand.   Despite report of slight improvement in pt's numbness last session after increasing use of her carpal tunnel braces, overall no significant improvement, and pt still reports 8/10 pain in R 2nd and 3rd digits, and 5/10 pain in the L hand 2nd and 3rd digits, with ongoing numbness in bilat hands.  Pt is indep in her HEP and joint protection strategies.  Planning OT d/c at this time d/t insufficient/no gains towards OT goals.  Recommending PT referral to address cervical spinal degenerative changes possibly affecting BUE function.  Pt disappointed but in agreement with plan.   PERFORMANCE DEFICITS: in functional skills including ADLs, IADLs, coordination, dexterity, sensation, ROM, strength, pain, flexibility, Fine motor control, body mechanics, decreased knowledge of precautions, decreased knowledge of use of DME, and UE functional use, and psychosocial skills including environmental adaptation, habits, and routines and behaviors.   IMPAIRMENTS: are limiting patient from ADLs, IADLs, and rest and sleep.   COMORBIDITIES: has co-morbidities such as RA, obesity  that affects occupational performance. Patient will benefit from skilled OT to address above impairments and improve overall function.  MODIFICATION OR ASSISTANCE TO COMPLETE EVALUATION: No modification of tasks or assist necessary to complete an evaluation.  OT OCCUPATIONAL PROFILE AND HISTORY: Problem focused assessment: Including review of records relating to presenting problem.  CLINICAL DECISION MAKING: Moderate - several treatment options, min-mod task modification necessary  REHAB POTENTIAL: Fair ; (lives alone, multiple co morbidities )  EVALUATION COMPLEXITY: Moderate      PLAN:  OT FREQUENCY: 2x/week  OT DURATION: 6 weeks  PLANNED INTERVENTIONS: self care/ADL training, therapeutic exercise, therapeutic activity,  manual therapy, passive range of motion, splinting, iontophoresis, paraffin, moist heat, cryotherapy, contrast bath, patient/family education, and DME and/or AE instructions  RECOMMENDED OTHER SERVICES: PT referral to address deficits related to cervical changes, decreased  mobility  CONSULTED AND AGREED WITH PLAN OF CARE: Patient  PLAN FOR NEXT SESSION: N/A; d/c this visit  Leta Speller, MS, OTR/L  Darleene Cleaver, OT 06/25/2022, 8:52 AM

## 2022-06-26 NOTE — Telephone Encounter (Signed)
chasity from [facility] called stating they need a narrative status included in the clinical notes that is on the order [phone]

## 2022-06-26 NOTE — Telephone Encounter (Signed)
I have placed to form with the format in the red folder.

## 2022-06-27 ENCOUNTER — Ambulatory Visit: Payer: Medicare PPO

## 2022-06-27 NOTE — Telephone Encounter (Signed)
Faxed on 06/27/22 and received ok that they received

## 2022-06-27 NOTE — Assessment & Plan Note (Addendum)
Patient 's mobility has deteriorated..  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  She cannot ambulate around her home safely on a walker or with a cane due to concurrent arthritis of both shoulders resulting in bilateral arm weakness  .  She cannot prepare meals from a standing position due to bilateral leg weakness  .  She is currently able to navigate her home independently in a manual wheelchair,  but her current wheelchair is not functioning well, and the seat is ripping at the sides and is at risk  for tearing completely through

## 2022-07-01 ENCOUNTER — Telehealth: Payer: Self-pay

## 2022-07-01 ENCOUNTER — Ambulatory Visit: Payer: Medicare PPO

## 2022-07-01 DIAGNOSIS — M25641 Stiffness of right hand, not elsewhere classified: Secondary | ICD-10-CM

## 2022-07-01 DIAGNOSIS — M25642 Stiffness of left hand, not elsewhere classified: Secondary | ICD-10-CM | POA: Diagnosis not present

## 2022-07-01 DIAGNOSIS — M6281 Muscle weakness (generalized): Secondary | ICD-10-CM | POA: Diagnosis not present

## 2022-07-01 DIAGNOSIS — R202 Paresthesia of skin: Secondary | ICD-10-CM | POA: Diagnosis not present

## 2022-07-01 NOTE — Telephone Encounter (Signed)
Zoe called from [facility] to follow-up on a fax they sent regarding new wheelchair for patient.  I let Zoe know that we did receive the fax but it is addressed to Dr. Dale [place].  Zoe is going to fax a new form addressed to Dr. Duncan Dull.

## 2022-07-01 NOTE — Therapy (Signed)
[facility] [address] [place], [place], [address] Phone: [phone]   Fax:  [phone]  Outpatient Physical Therapy Evaluation  Patient Details  Name: Bonnie Hinton MRN: [id number] Date of Birth: June 27, 1940 Referring Provider (PT): Cristopher PeruHemang Shah   Encounter Date: 07/01/2022   PT End of Session - 07/01/22 1014     Visit Number 1    Number of Visits 1   evaluation only   Authorization Type [employer]    Authorization Time Period 07/01/22-07/31/22    PT Start Time 0845    PT Stop Time 0930    PT Time Calculation (min) 45 min    Activity Tolerance Patient tolerated treatment well;No increased pain    Behavior During Therapy WFL for tasks assessed/performed             Past Medical History:  Diagnosis Date   Anxiety    Arthritis    Choledocholithiasis    Chronic back pain    stenosis and arthritis   Community acquired pneumonia 05/29/2021   Complication of anesthesia    anxious when she wakes up   GERD (gastroesophageal reflux disease)    takes Pantoprazole daily   Glaucoma    dry angle   History of bronchitis    30+ yrs ago   History of MRSA infection 2004   several yrs ago   Hyperlipidemia    takes Zetia and Crestor daily   Hypertension    take Amlodipine daily   Hypothyroidism    takes Synthroid daily   Joint pain    Joint swelling    Neuropathy    takes Gabapentin daily   Pancreatitis, gallstone    Peripheral edema    takes Furosemide daily as needed   Pneumonia    hx of-30 + yrs ago   Pre-diabetes    takes Victoza daily   Primary localized osteoarthritis of right knee    Subdural hematoma (HCC) 8 yrs ago   hx of   Urinary frequency    Urinary urgency     Past Surgical History:  Procedure Laterality Date   ABDOMINAL HYSTERECTOMY     CHOLECYSTECTOMY N/A 09/09/2016   Procedure: LAPAROSCOPIC CHOLECYSTECTOMY;  Surgeon: Manus Ruddsuei, Matthew, MD;  Location: [facility] OR;  Service: General;   Laterality: N/A;   COLONOSCOPY     RADIOLOGY WITH ANESTHESIA N/A 08/22/2015   Procedure: MRI OF LUMBAR SPINE   (RADIOLOGY WITH ANESTHESIA);  Surgeon: Medication Radiologist, MD;  Location: [facility] OR;  Service: Radiology;  Laterality: N/A;   TOTAL HIP ARTHROPLASTY Right    TOTAL HIP ARTHROPLASTY Left    TOTAL KNEE ARTHROPLASTY Left    TOTAL KNEE ARTHROPLASTY Right 01/01/2016   Procedure: TOTAL KNEE ARTHROPLASTY;  Surgeon: Salvatore Marvelobert Wainer, MD;  [facility];  Service: Orthopedics;  Laterality: Right;    There were no vitals filed for this visit.    Subjective Assessment - 07/01/22 1030     Subjective Pt stiff having stiffness, tingling, and pain in both hands that interferes with use of hands with walker, wants to see if PT for her neck will help.    Pertinent History Bonnie Hinton is an 81yoF who presents to OPPT for evalaution of ongoing stiffness, paresthesia, and pain in bilat hands that began ~2 years ago. Pt is a limited historian. 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 Pt recent finished 10 visits with OPOT here for this same issue. Between all previous treatments, pt has had a big improvement in paresthesia/tingling issue, now only an intermittent phenomeneon, typical end of the day a few days a week, however pt remain most distressed by ongoing stiffness in digits 2 & 3 bilaterally. She denies any symptomology in her 1st digits bilat. Pt is unable to use a walker for transfers or short distance AMB in her home due to pain in hands when using it- she has been using WC as primary mobility means for ~8 years due to knee DJD issues.    Currently in Pain? No/denies   stiffness only               [facility] PT Assessment - 07/01/22 0001       Assessment   Medical Diagnosis bilat hand pain    Referring Provider (PT) Hemang Sherryll Burger    Onset  Date/Surgical Date --   ~18 months ago   Hand Dominance Right    Prior Therapy Occupational Therapy      Precautions   Precautions Fall      Restrictions   Weight Bearing Restrictions No      Balance Screen   Has the patient fallen in the past 6 months No    Has the patient had a decrease in activity level because of a fear of falling?  No    Is the patient reluctant to leave their home because of a fear of falling?  No      Home Nurse, mental health Private residence    Living Arrangements Alone    Available Help at Discharge Family   DTR lives in [place]   Type of Home House    Home Access Level entry    Home Layout One level    Home Equipment Shower seat - built in;Shower seat;Wheelchair - [employer] - 2 wheels   sleeps in a regular, non electric bed     Prior Function   Level of Independence Independent with basic ADLs   fingers not interfering with ADL performance   Vocation Retired            Higher education careers adviser ROM Assessment:  Rt hand ROM:  -Digit 1 CMC extension: 30 degrees -Flexion MCP 2: 80 degrees  -Flexion MCP 3: 80 degrees  -Flexion MCP 4: 90 degrees  -Flexion MCP 5: 110 degrees    Lt hand ROM:  -Digit 1 CMC extension: 25 degrees -Flexion MCP 2: 80 degrees  -Flexion MCP 3: 90 degrees  -Flexion MCP 4: 115 degrees  -Flexion MCP 5: 110 degrees   *noted swan neck phenomenon in 8 smaller digits- pt reports this has been the norm since childhood.   Objective measurements completed on examination: See above findings.       Plan - 07/01/22 1015     Clinical Impression Statement Pt here for assessment of ongoing bilateral hand pain, stiffness, paresthesia- pt recently did OT for hands for 'carpal tunnel' issue and wanted to see if physical therapy could determine any role of cervical spine etioloy with her chief complant. Author explained this is outside of the scope of PT to formally diagnose a cervical spine etiology, particularly when very  thorough assessment has already been done by neurology and rheumatology recently for this issue. Both specialist notes reviewed. Discssed symptomology with patient in detail, as well as recent OT course, and goals of care. ROM assessment of hands  performed today. Pt's CC of stiffness in bilats 2 & 3 correlates well with both physical appearance of arthropathy, as well as prior imaging studies. Per pt report, sounds as though recen tinjection therapies and OT treatment have been useful in decreasing intensity and frequency of paresthesia. ROM shows significant ROM restriction in 1st CMCJ bilat and digits 2 & 3 MCP joints bilat. Author recommended continued home [employer] as established with OT recently as well as continued recommendations and treatment from neurology and rheumatology. At this point I do not think that physical therapy services has anything additiional or unique to contribute to management of this problem. Recommended possibly seeing our OT hand therapist specialist in the future should she feel that additional OT services be helpful. Will plan on evalaution only here for this problem and defer continued treatment to referring provider.  *Of note: Thereasa Parkin is concerned about pt's knowledge of her medical conditions and ability to manage these. She asks many questions about RA that indicate very minimal understanding of the disorder, Thereasa Parkin redirects these to her rheumatologist who would be better poised to provide answers to these.     Personal Factors and Comorbidities Time since onset of injury/illness/exacerbation;Past/Current Experience    Examination-Activity Limitations Locomotion Level;Transfers    Examination-Participation Restrictions Meal Prep    Stability/Clinical Decision Making Evolving/Moderate complexity    Clinical Decision Making High    Rehab Potential Poor    PT Frequency One time visit    PT Duration 4 weeks    PT Treatment/Interventions ADLs/Self Care Home  Management    PT Home Exercise Plan No updates- continue with prior OT HEP    Consulted and Agree with Plan of Care Patient             Patient will benefit from skilled therapeutic intervention in order to improve the following deficits and impairments:  Hypomobility, Decreased knowledge of precautions, Decreased activity tolerance  Visit Diagnosis: Stiffness of hand joint, left  Stiffness of hand joint, right     Problem List Patient Active Problem List   Diagnosis Date Noted   Mobility impaired 06/19/2022   Hordeolum externum of left upper eyelid 02/20/2022   Polyarthritis 10/26/2021   Foot deformity, acquired, left 10/25/2021   Rash 10/25/2021   Osteopenia after menopause 10/25/2021   Pitting edema 10/25/2021   Mass of breast, left 08/08/2021   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   Numbness and tingling in right hand 04/25/2021   Mild protein-calorie malnutrition 08/16/2020   OA (osteoarthritis) of finger, right 04/05/2020   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   Candidiasis of skin 06/28/2017   IBS (irritable bowel syndrome) 06/28/2017   Screening for colon cancer 12/28/2016   Dizziness 05/24/2016   Anxiety, generalized 02/20/2016   Arthritis    Acquired hypothyroidism    History of CVA (cerebrovascular accident)    Status post bilateral hip replacements    Status post right knee replacement    Degenerative lumbar spinal stenosis    Essential hypertension, benign 07/25/2015   Preoperative clearance 07/25/2015   Hyperlipidemia associated with type 2 diabetes mellitus 07/25/2015   Mixed stress and urge urinary incontinence 04/26/2015   Drug-induced hyperkalemia 04/26/2015   Diabetic nephropathy associated with type 2 diabetes mellitus 01/24/2015   Morbid obesity 01/24/2015   Vitamin D deficiency 01/24/2015   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 [place], [place], [address] Phone: [phone]   Fax:  [phone]  Name: Bonnie Hinton MRN: [id number] Date of Birth: 1941-03-04

## 2022-07-01 NOTE — Telephone Encounter (Signed)
faxed

## 2022-07-01 NOTE — Telephone Encounter (Signed)
Form has been placed in quick sign folder.  

## 2022-07-02 ENCOUNTER — Ambulatory Visit: Payer: Medicare PPO

## 2022-07-04 ENCOUNTER — Ambulatory Visit: Payer: Medicare PPO

## 2022-07-04 DIAGNOSIS — M48062 Spinal stenosis, lumbar region with neurogenic claudication: Secondary | ICD-10-CM | POA: Diagnosis not present

## 2022-07-14 DIAGNOSIS — E1165 Type 2 diabetes mellitus with hyperglycemia: Secondary | ICD-10-CM | POA: Diagnosis not present

## 2022-07-17 ENCOUNTER — Encounter: Payer: Medicare PPO | Admitting: Occupational Therapy

## 2022-07-24 ENCOUNTER — Other Ambulatory Visit: Payer: Self-pay | Admitting: Internal Medicine

## 2022-07-24 DIAGNOSIS — N3941 Urge incontinence: Secondary | ICD-10-CM

## 2022-07-26 ENCOUNTER — Other Ambulatory Visit: Payer: Self-pay

## 2022-07-26 ENCOUNTER — Other Ambulatory Visit: Payer: Medicare PPO

## 2022-07-26 MED ORDER — TIZANIDINE HCL 4 MG PO TABS: ORAL_TABLET | ORAL | 10 refills | Status: DC

## 2022-07-31 ENCOUNTER — Telehealth: Payer: Self-pay

## 2022-07-31 ENCOUNTER — Ambulatory Visit: Payer: Medicare Other | Attending: Internal Medicine

## 2022-07-31 ENCOUNTER — Ambulatory Visit: Payer: Medicare Other | Admitting: Internal Medicine

## 2022-07-31 ENCOUNTER — Encounter: Payer: Self-pay | Admitting: Internal Medicine

## 2022-07-31 VITALS — BP 138/74 | HR 110 | Ht 65.0 in | Wt 300.0 lb

## 2022-07-31 DIAGNOSIS — E1169 Type 2 diabetes mellitus with other specified complication: Secondary | ICD-10-CM

## 2022-07-31 DIAGNOSIS — I499 Cardiac arrhythmia, unspecified: Secondary | ICD-10-CM

## 2022-07-31 DIAGNOSIS — Z794 Long term (current) use of insulin: Secondary | ICD-10-CM

## 2022-07-31 DIAGNOSIS — E119 Type 2 diabetes mellitus without complications: Secondary | ICD-10-CM

## 2022-07-31 DIAGNOSIS — E118 Type 2 diabetes mellitus with unspecified complications: Secondary | ICD-10-CM | POA: Diagnosis not present

## 2022-07-31 DIAGNOSIS — N3941 Urge incontinence: Secondary | ICD-10-CM

## 2022-07-31 DIAGNOSIS — I4891 Unspecified atrial fibrillation: Secondary | ICD-10-CM

## 2022-07-31 DIAGNOSIS — E785 Hyperlipidemia, unspecified: Secondary | ICD-10-CM

## 2022-07-31 DIAGNOSIS — R0683 Snoring: Secondary | ICD-10-CM

## 2022-07-31 DIAGNOSIS — E1121 Type 2 diabetes mellitus with diabetic nephropathy: Secondary | ICD-10-CM | POA: Diagnosis not present

## 2022-07-31 DIAGNOSIS — I10 Essential (primary) hypertension: Secondary | ICD-10-CM

## 2022-07-31 DIAGNOSIS — E039 Hypothyroidism, unspecified: Secondary | ICD-10-CM | POA: Diagnosis not present

## 2022-07-31 LAB — LIPID PANEL
Cholesterol: 195 mg/dL (ref 0–200)
HDL: 40.9 mg/dL
LDL Cholesterol: 118 mg/dL — ABNORMAL HIGH (ref 0–99)
NonHDL: 154.08
Total CHOL/HDL Ratio: 5
Triglycerides: 181 mg/dL — ABNORMAL HIGH (ref 0.0–149.0)
VLDL: 36.2 mg/dL (ref 0.0–40.0)

## 2022-07-31 LAB — LDL CHOLESTEROL, DIRECT: Direct LDL: 128 mg/dL

## 2022-07-31 LAB — POCT GLYCOSYLATED HEMOGLOBIN (HGB A1C): Hemoglobin A1C: 6.9 % — AB (ref 4.0–5.6)

## 2022-07-31 LAB — COMPREHENSIVE METABOLIC PANEL WITH GFR
ALT: 13 U/L (ref 0–35)
AST: 17 U/L (ref 0–37)
Albumin: 4 g/dL (ref 3.5–5.2)
Alkaline Phosphatase: 59 U/L (ref 39–117)
BUN: 16 mg/dL (ref 6–23)
CO2: 26 meq/L (ref 19–32)
Calcium: 9.3 mg/dL (ref 8.4–10.5)
Chloride: 100 meq/L (ref 96–112)
Creatinine, Ser: 0.78 mg/dL (ref 0.40–1.20)
GFR: 71.17 mL/min
Glucose, Bld: 137 mg/dL — ABNORMAL HIGH (ref 70–99)
Potassium: 4.4 meq/L (ref 3.5–5.1)
Sodium: 135 meq/L (ref 135–145)
Total Bilirubin: 0.6 mg/dL (ref 0.2–1.2)
Total Protein: 7.4 g/dL (ref 6.0–8.3)

## 2022-07-31 LAB — MICROALBUMIN / CREATININE URINE RATIO
Creatinine,U: 153.3 mg/dL
Microalb Creat Ratio: 1.2 mg/g (ref 0.0–30.0)
Microalb, Ur: 1.9 mg/dL (ref 0.0–1.9)

## 2022-07-31 LAB — TSH: TSH: 1.53 u[IU]/mL (ref 0.35–5.50)

## 2022-07-31 MED ORDER — METOPROLOL TARTRATE 50 MG PO TABS: 50.0000 mg | ORAL_TABLET | Freq: Two times a day (BID) | ORAL | 1 refills | Status: DC

## 2022-07-31 MED ORDER — FUROSEMIDE 20 MG PO TABS
20.0000 mg | ORAL_TABLET | Freq: Every day | ORAL | 3 refills | Status: DC
Start: 2022-07-31 — End: 2023-06-02

## 2022-07-31 MED ORDER — TOLTERODINE TARTRATE ER 4 MG PO CP24: 4.0000 mg | ORAL_CAPSULE | Freq: Two times a day (BID) | ORAL | 1 refills | Status: DC

## 2022-07-31 MED ORDER — EZETIMIBE 10 MG PO TABS: 10.0000 mg | ORAL_TABLET | Freq: Every day | ORAL | 1 refills | Status: DC

## 2022-07-31 MED ORDER — AMLODIPINE BESYLATE 10 MG PO TABS: 10.0000 mg | ORAL_TABLET | Freq: Every day | ORAL | 1 refills | Status: DC

## 2022-07-31 MED ORDER — CETIRIZINE HCL 10 MG PO TABS: 10.0000 mg | ORAL_TABLET | Freq: Every day | ORAL | 3 refills | Status: DC

## 2022-07-31 MED ORDER — DICYCLOMINE HCL 20 MG PO TABS: ORAL_TABLET | ORAL | 0 refills | Status: DC

## 2022-07-31 MED ORDER — LEVOTHYROXINE SODIUM 150 MCG PO TABS: 150.0000 ug | ORAL_TABLET | Freq: Every day | ORAL | 3 refills | Status: DC

## 2022-07-31 MED ORDER — TIZANIDINE HCL 4 MG PO TABS: ORAL_TABLET | ORAL | 3 refills | Status: DC

## 2022-07-31 MED ORDER — SPIRONOLACTONE 50 MG PO TABS: 50.0000 mg | ORAL_TABLET | Freq: Every day | ORAL | 3 refills | Status: DC

## 2022-07-31 MED ORDER — METOPROLOL TARTRATE 50 MG PO TABS: 50.0000 mg | ORAL_TABLET | Freq: Two times a day (BID) | ORAL | 2 refills | Status: DC

## 2022-07-31 MED ORDER — LANTUS SOLOSTAR 100 UNIT/ML ~~LOC~~ SOPN
25.0000 [IU] | PEN_INJECTOR | Freq: Every day | SUBCUTANEOUS | 10 refills | Status: DC
Start: 2022-07-31 — End: 2022-08-05

## 2022-07-31 MED ORDER — RIVAROXABAN 20 MG PO TABS: 20.0000 mg | ORAL_TABLET | Freq: Every day | ORAL | 3 refills | Status: DC

## 2022-07-31 MED ORDER — PANTOPRAZOLE SODIUM 40 MG PO TBEC: 40.0000 mg | DELAYED_RELEASE_TABLET | Freq: Every day | ORAL | 1 refills | Status: DC

## 2022-07-31 NOTE — Assessment & Plan Note (Addendum)
I have ordered and reviewed a 12 lead EKG and find that  she is in atrial fibrillation   NEW DIAGNOSIS  .  ASYMPTOMATIC.  STARTING METOPROLOL 50 MG BID AND XARELTO 20 MG DAILY .  REFERRING TO CARDIOLOGY FOR COMPREHENSIVE EVALUATION.  WILL NEED HOME  SLEEP STUDY GIVEN HER MORBID OBESITY.  WILL NEED TO STOP CELEBREX,

## 2022-07-31 NOTE — Telephone Encounter (Signed)
Pt is aware of message below and gave a verbal understanding. Pt stated that she doesn't take the celebrex during the warm months anyway. Also all pt's medication have been sent to her new mail order pharmacy. Pt is aware.

## 2022-07-31 NOTE — Addendum Note (Signed)
Addended by: Sandy Salaam on: 07/31/2022 04:30 PM   Modules accepted: Orders

## 2022-07-31 NOTE — Patient Instructions (Addendum)
You are in an irregular heart rhyth called atrial fibrillation.  This requires medication,  anticoagulation and cardiology follow up  Please start taking metoprolol  twice daily.  To slow your heart rate down.  Cardiology referral is in process.  The anticoagulation will be decided once I see what your insurance will pay for     Your diabetes is under good control   Your A1c has been reduced to 6.9  Fasting sugars should be between 80 and 130   You need to check your sugars 2 hours after a meal (it should be < 160)   Ok to continue 28 units lantus as  long as your fasting sugars are above 80

## 2022-07-31 NOTE — Telephone Encounter (Signed)
URGENT CARDIOLOGY REFERRAL FOR NEW ONSET ATRIAL FIBRILLATION ,  THANK YOU!  7 mins You were added by Sherlene Shams, MD. 1 min RY TT Sherlene Shams, MD ADDITIONAL ORDERS I HAVE MADE ON MS [place]I:   XARELTO FOR ANTICOAGULATION,  ONE TABLET DAILY.  NEEDS TO STOP USING CELEBREX BECAUSE OF INCREASED RISK OF GI BLEEDING  .  HOME SLEEP STUDY AND ZIO MONITOR ORDERED

## 2022-07-31 NOTE — Progress Notes (Signed)
Subjective:  Patient ID: Bonnie Hinton, female    DOB: Sep 16, 1940  Age: 82 y.o. MRN: [id number]  CC: The primary encounter diagnosis was Insulin-requiring or dependent type II diabetes mellitus (HCC). Diagnoses of Acquired hypothyroidism, Diabetic nephropathy associated with type 2 diabetes mellitus (HCC), Essential hypertension, benign, Hyperlipidemia associated with type 2 diabetes mellitus (HCC), Diabetes mellitus with complication (HCC), Irregular heart beat, Atrial fibrillation, unspecified type (HCC), Morbid obesity (HCC), and Snoring were also pertinent to this visit.   HPI Bonnie Hinton presents for  Chief Complaint  Patient presents with   Medical Management of Chronic Issues   1) MORBID OBESITY:  no weight change since February.  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 "Too many things  happening."   Using  a freestyle Libre 2 for glycemic monitoring but did not bring her sensor with her today. Sugars have been "pretty good."  But has been indulging in cakes with jelly.   BS have been   under 130 fasting  most of the time and < 150 post prandially.  Denies any recent hypoglyemic events.  Taking   medications as directed. Following a carbohydrate modified diet 6 days per week. Denies numbness, burning and tingling of extremities. Appetite is good.    3) HTN:  taking amlodipine and sprironolactone daily  does not check at home    Outpatient Medications Prior to Visit  Medication Sig Dispense Refill   acetaminophen (TYLENOL) 325 MG tablet Take 2 tablets (650 mg total) by mouth every 6 (six) hours as needed for mild pain (or Fever >/= 101).     amLODipine (NORVASC) 10 MG tablet TAKE 1 TABLET EVERY DAY 90 tablet 10   amoxicillin (AMOXIL) 500 MG capsule TAKE 4 CAPSULES  BY MOUTH 1 HOUR PRIOR TO DENTAL PROCEDURE 4 capsule 2   cetirizine (ZYRTEC) 10 MG tablet TAKE 1 TABLET EVERY DAY 90 tablet 3   Cholecalciferol (VITAMIN D3) 2000 units TABS Take 1 tablet by mouth daily.     Continuous Blood Gluc Sensor (FREESTYLE LIBRE 2 SENSOR) MISC Apply 1 each topically every 14 (fourteen) days. 6 each 3   cyanocobalamin (VITAMIN B12) 1000 MCG/ML injection Inject 1,000 mcg into the muscle every 30 (thirty) days.     cycloSPORINE (RESTASIS) 0.05 % ophthalmic emulsion Place 1 drop into both eyes 2 (two) times daily.     dicyclomine (BENTYL) 20 MG tablet TAKE 1 TABLET FOUR TIMES DAILY BEFORE MEALS AND AT BEDTIME 360 tablet 0   DROPLET PEN NEEDLES 31G X 5 MM MISC USE DAILY 100 each 10   ezetimibe (ZETIA) 10 MG tablet TAKE 1 TABLET EVERY DAY 90 tablet 10   folic acid (FOLVITE) 1 MG tablet Take 1 mg by mouth daily.     furosemide (LASIX) 20 MG tablet TAKE 1 TABLET EVERY DAY 90 tablet 3   hydroxychloroquine (PLAQUENIL) 200 MG tablet Take 200 mg by mouth 2 (two) times daily.     LANTUS SOLOSTAR 100 UNIT/ML Solostar Pen INJECT 25 UNITS INTO THE SKIN DAILY. INCREASE AS DIRECTED BY PHYSICIAN 30 mL 10   levothyroxine (SYNTHROID) 150 MCG tablet Take 1 tablet (150 mcg total) by mouth daily. 90 tablet 3   LUMIGAN 0.01 % SOLN Place 1-2 drops into both eyes  at bedtime. 5 mL 0   methotrexate (RHEUMATREX) 2.5 MG tablet Take 5 tablets by mouth once a week.     mupirocin ointment (BACTROBAN) 2 % APPLY TO AFFECTED AREA TWICE A DAY 22 g 11   nystatin cream (MYCOSTATIN) APPLY TO THE AFFECTED AREA(S) TWICE DAILY 90 g 2   pantoprazole (PROTONIX) 40 MG tablet TAKE 1 TABLET EVERY DAY 90 tablet 10   spironolactone (ALDACTONE) 50 MG tablet TAKE 1 TABLET EVERY DAY 90 tablet 3   tiZANidine (ZANAFLEX) 4 MG tablet TAKE 1 TABLET EVERY 6 HOURS AS NEEDED FOR MUSCLE SPASM(S) 270 tablet 10   tolterodine (DETROL LA) 4 MG 24 hr capsule TAKE 1 CAPSULE TWICE DAILY 180 capsule 10   trimethoprim-polymyxin b (POLYTRIM)  ophthalmic solution PLACE 1 DROP INTO THE LEFT EYE EVERY 6 HOURS. 10 mL 0   celecoxib (CELEBREX) 100 MG capsule TAKE 1 CAPSULE TWICE DAILY 180 capsule 10   No facility-administered medications prior to visit.    Review of Systems;  Patient denies headache, fevers, malaise, unintentional weight loss, skin rash, eye pain, sinus congestion and sinus pain, sore throat, dysphagia,  hemoptysis , cough, dyspnea, wheezing, chest pain, palpitations, orthopnea, edema, abdominal pain, nausea, melena, diarrhea, constipation, flank pain, dysuria, hematuria, urinary  Frequency, nocturia, numbness, tingling, seizures,  Focal weakness, Loss of consciousness,  Tremor, insomnia, depression, anxiety, and suicidal ideation.      Objective:  BP 138/74   Pulse (!) 110   Ht 5\' 5"  (1.651 m)   Wt 300 lb (136.1 kg)   SpO2 96%   BMI 49.92 kg/m   BP Readings from Last 3 Encounters:  07/31/22 138/74  05/23/22 (!) 143/90  04/29/22 138/80    Wt Readings from Last 3 Encounters:  07/31/22 300 lb (136.1 kg)  04/29/22 300 lb (136.1 kg)  03/22/22 (!) 330 lb (149.7 kg)    Physical Exam Vitals reviewed.  Constitutional:      General: She is not in acute distress.    Appearance: Normal appearance. She is obese. She is not ill-appearing, toxic-appearing or diaphoretic.  HENT:     Head: Normocephalic.  Eyes:     General: No scleral icterus.       Right eye: No discharge.        Left eye: No discharge.     Conjunctiva/sclera: Conjunctivae normal.  Cardiovascular:     Rate and Rhythm: Tachycardia present. Rhythm irregular.     Heart sounds: Normal heart sounds.  Pulmonary:     Effort: Pulmonary effort is normal. No respiratory distress.     Breath sounds: Normal breath sounds.  Musculoskeletal:        General: Normal range of motion.  Skin:    General: Skin is warm and dry.  Neurological:     General: No focal deficit present.     Mental Status: She is alert and oriented to person, place, and time.  Mental status is at baseline.  Psychiatric:        Mood and Affect: Mood normal.        Behavior: Behavior normal.        Thought Content: Thought content normal.        Judgment: Judgment normal.    Lab Results  Component Value Date   HGBA1C 6.9 (A) 07/31/2022   HGBA1C 7.3 (H) 04/25/2022   HGBA1C 7.3 (H) 01/22/2022    Lab Results  Component Value Date   CREATININE 0.92 04/25/2022   CREATININE 0.89 01/22/2022   CREATININE  0.93 10/23/2021    Lab Results  Component Value Date   WBC 8.4 06/10/2018   HGB 14.9 06/10/2018   HCT 45.1 06/10/2018   PLT 306.0 06/10/2018   GLUCOSE 163 (H) 04/25/2022   CHOL 245 (H) 04/25/2022   TRIG 199.0 (H) 04/25/2022   HDL 44.20 04/25/2022   LDLDIRECT 162.0 04/25/2022   LDLCALC 161 (H) 04/25/2022   ALT 15 04/25/2022   AST 20 04/25/2022   NA 134 (L) 04/25/2022   K 4.8 04/25/2022   CL 99 04/25/2022   CREATININE 0.92 04/25/2022   BUN 20 04/25/2022   CO2 24 04/25/2022   TSH 9.69 (H) 01/22/2022   INR 1.37 09/04/2016   HGBA1C 6.9 (A) 07/31/2022   MICROALBUR 14.6 (H) 04/20/2021    MM DIAG BREAST TOMO UNI LEFT  Result Date: 03/06/2022 CLINICAL DATA:  BI-RADS 3 follow-up of 2 adjacent LEFT breast masses favored to reflect intramammary lymph nodes. EXAM: DIGITAL DIAGNOSTIC UNILATERAL LEFT MAMMOGRAM WITH TOMOSYNTHESIS; ULTRASOUND LEFT BREAST LIMITED TECHNIQUE: Left digital diagnostic mammography and breast tomosynthesis was performed.; Targeted ultrasound examination of the left breast was performed. Best images possible per technologist communication. COMPARISON:  Previous exam(s). ACR Breast Density Category a: The breast tissue is almost entirely fatty. FINDINGS: Diagnostic images LEFT breast demonstrate mammographic stability of 3 adjacent oval circumscribed masses in the LEFT upper outer breast at middle to posterior depth. The larger of the masses has been previously characterized as a benign intramammary lymph node. No new suspicious findings  are noted in the LEFT breast. Targeted ultrasound was performed the LEFT upper outer breast. At 1 o'clock 8 cm from nipple, there is revisualization of an oval circumscribed mass. It measures 4 x 3 x 5 mm, previously 4 x 3 x 5 mm. Adjacent mass measures 3 x 4 x 2 mm, previously 3 x 4 x 2 mm. They demonstrate suggestion of internal echogenic hila. IMPRESSION: There are 2 stable probably benign masses likely reflecting benign intramammary lymph nodes. Recommend follow-up diagnostic mammogram (with ultrasound if deemed necessary) in 6 months. This will establish 1 year of definitive stability from baseline. RECOMMENDATION: Bilateral diagnostic mammogram (with RIGHT and LEFT breast ultrasound if deemed necessary) in 6 months. I have discussed the findings and recommendations with the patient. If applicable, a reminder letter will be sent to the patient regarding the next appointment. BI-RADS CATEGORY  3: Probably benign. Electronically Signed   By: Meda Klinefelter M.D.   On: 03/06/2022 10:36  US BREAST LTD UNI LEFT INC AXILLA  Result Date: 03/06/2022 CLINICAL DATA:  BI-RADS 3 follow-up of 2 adjacent LEFT breast masses favored to reflect intramammary lymph nodes. EXAM: DIGITAL DIAGNOSTIC UNILATERAL LEFT MAMMOGRAM WITH TOMOSYNTHESIS; ULTRASOUND LEFT BREAST LIMITED TECHNIQUE: Left digital diagnostic mammography and breast tomosynthesis was performed.; Targeted ultrasound examination of the left breast was performed. Best images possible per technologist communication. COMPARISON:  Previous exam(s). ACR Breast Density Category a: The breast tissue is almost entirely fatty. FINDINGS: Diagnostic images LEFT breast demonstrate mammographic stability of 3 adjacent oval circumscribed masses in the LEFT upper outer breast at middle to posterior depth. The larger of the masses has been previously characterized as a benign intramammary lymph node. No new suspicious findings are noted in the LEFT breast. Targeted ultrasound  was performed the LEFT upper outer breast. At 1 o'clock 8 cm from nipple, there is revisualization of an oval circumscribed mass. It measures 4 x 3 x 5 mm, previously 4 x 3 x 5 mm. Adjacent mass measures 3  x 4 x 2 mm, previously 3 x 4 x 2 mm. They demonstrate suggestion of internal echogenic hila. IMPRESSION: There are 2 stable probably benign masses likely reflecting benign intramammary lymph nodes. Recommend follow-up diagnostic mammogram (with ultrasound if deemed necessary) in 6 months. This will establish 1 year of definitive stability from baseline. RECOMMENDATION: Bilateral diagnostic mammogram (with RIGHT and LEFT breast ultrasound if deemed necessary) in 6 months. I have discussed the findings and recommendations with the patient. If applicable, a reminder letter will be sent to the patient regarding the next appointment. BI-RADS CATEGORY  3: Probably benign. Electronically Signed   By: Meda Klinefelter M.D.   On: 03/06/2022 10:36   Assessment & Plan:  .Insulin-requiring or dependent type II diabetes mellitus (HCC)  Acquired hypothyroidism -     TSH  Diabetic nephropathy associated with type 2 diabetes mellitus (HCC) -     Comprehensive metabolic panel  Essential hypertension, benign -     Comprehensive metabolic panel  Hyperlipidemia associated with type 2 diabetes mellitus (HCC) -     LDL cholesterol, direct -     Lipid panel -     Microalbumin / creatinine urine ratio  Diabetes mellitus with complication (HCC) -     Microalbumin / creatinine urine ratio -     POCT glycosylated hemoglobin (Hb A1C)  Irregular heart beat -     EKG 12-Lead  Atrial fibrillation, unspecified type [facility]) Assessment & Plan: I have ordered and reviewed a 12 lead EKG and find that  she is in atrial fibrillation   NEW DIAGNOSIS  .  ASYMPTOMATIC.  STARTING METOPROLOL 50 MG BID AND XARELTO 20 MG DAILY .  REFERRING TO CARDIOLOGY FOR COMPREHENSIVE EVALUATION.  WILL NEED HOME  SLEEP STUDY GIVEN HER MORBID  OBESITY.  WILL NEED TO STOP CELEBREX,    Orders: -     Ambulatory referral to Cardiology -     PSG SLEEP STUDY; Future -     LONG TERM MONITOR (3-14 DAYS); Future  Morbid obesity (HCC) -     PSG SLEEP STUDY; Future  Snoring -     PSG SLEEP STUDY; Future  Other orders -     Metoprolol Tartrate; Take 1 tablet (50 mg total) by mouth 2 (two) times daily.  Dispense: 60 tablet; Refill: 2 -     Rivaroxaban; Take 1 tablet (20 mg total) by mouth daily with supper.  Dispense: 30 tablet; Refill: 3     IN ADDITION TO time spend reading her EKG,  I provided 30 minutes of face-to-face time during this encounter reviewing patient's last visit with me, patient's  most recent visit with cardiology,  nephrology,  and neurology,  recent surgical and non surgical procedures, previous  labs and imaging studies, counseling on currently addressed issues,  and post visit ordering to diagnostics and therapeutics .   Follow-up: No follow-ups on file.   Sherlene Shams, MD

## 2022-08-02 ENCOUNTER — Other Ambulatory Visit: Payer: Self-pay | Admitting: Internal Medicine

## 2022-08-02 DIAGNOSIS — E118 Type 2 diabetes mellitus with unspecified complications: Secondary | ICD-10-CM

## 2022-08-03 DIAGNOSIS — I4891 Unspecified atrial fibrillation: Secondary | ICD-10-CM | POA: Diagnosis not present

## 2022-08-05 ENCOUNTER — Telehealth: Payer: Self-pay

## 2022-08-05 ENCOUNTER — Other Ambulatory Visit: Payer: Self-pay | Admitting: Internal Medicine

## 2022-08-05 DIAGNOSIS — E118 Type 2 diabetes mellitus with unspecified complications: Secondary | ICD-10-CM

## 2022-08-05 MED ORDER — LANTUS SOLOSTAR 100 UNIT/ML ~~LOC~~ SOPN
25.0000 [IU] | PEN_INJECTOR | Freq: Every day | SUBCUTANEOUS | 10 refills | Status: AC
Start: 2022-08-05 — End: ?

## 2022-08-05 NOTE — Telephone Encounter (Signed)
noted 

## 2022-08-05 NOTE — Telephone Encounter (Signed)
Enna called from [facility] to state they need clarification on prescription for patient's  insulin glargine (LANTUS SOLOSTAR) 100 UNIT/ML Solostar Pen.

## 2022-08-15 ENCOUNTER — Telehealth: Payer: Self-pay

## 2022-08-15 NOTE — Telephone Encounter (Signed)
Recent RX was sent to [facility] and not [facility].   Spoke with [employer] rep and updated sig. They do not need a new rx at this time. Confirmed patient has available refills. Nothing further needed at this time.

## 2022-08-15 NOTE — Telephone Encounter (Signed)
Enna called from [facility] for clarification on prescription we sent for patient's  insulin glargine (LANTUS SOLOSTAR) 100 UNIT/ML Solostar Pen.  Champ Mungo states she needs the max daily dosing for the Lantus.  Champ Mungo states she would like for [place] to put a rush on this because they have been trying to reach [place] since 08/02/2022 and patient has been waiting for this medication.

## 2022-08-23 ENCOUNTER — Other Ambulatory Visit: Payer: Self-pay | Admitting: Internal Medicine

## 2022-08-26 ENCOUNTER — Other Ambulatory Visit: Payer: Self-pay | Admitting: Internal Medicine

## 2022-08-26 DIAGNOSIS — I4819 Other persistent atrial fibrillation: Secondary | ICD-10-CM

## 2022-08-27 ENCOUNTER — Telehealth: Payer: Self-pay

## 2022-08-27 ENCOUNTER — Other Ambulatory Visit: Payer: Self-pay | Admitting: Internal Medicine

## 2022-08-27 DIAGNOSIS — N63 Unspecified lump in unspecified breast: Secondary | ICD-10-CM

## 2022-08-27 DIAGNOSIS — R928 Other abnormal and inconclusive findings on diagnostic imaging of breast: Secondary | ICD-10-CM

## 2022-08-27 NOTE — Telephone Encounter (Signed)
-----   Message from Sherlene Shams, MD sent at 08/26/2022  9:40 PM EDT ----- ZIO MONITOR confirms atrial fibrillation is persistent.  Continue xarelto.  Has she been contacted with a cardiology appt yet/ I have ordered an ECHO  to help expedite the evaluation

## 2022-08-27 NOTE — Telephone Encounter (Signed)
LMTCB in regards to lab results.  

## 2022-08-27 NOTE — Telephone Encounter (Signed)
Patient returned office phone call and note was read. Cardiology has called her for an appointment. The issue is she can not go there. She would have to walk from the parking lot to get a wheel chair and she can not do that. This is the same issue for her ECHO, too.  Patient has some questions, please call her.

## 2022-08-28 NOTE — Telephone Encounter (Signed)
Spoke with pt and she stated that she is not able to go to the cardiologist until she has someone that can take her. Her daughter is not able to take her for the next two weeks. Pt called cardiology and rescheduled for 10/10/2022. Pt does have an appt with you scheduled for Monday.

## 2022-08-29 ENCOUNTER — Ambulatory Visit: Payer: Medicare PPO | Admitting: Cardiology

## 2022-08-29 ENCOUNTER — Ambulatory Visit: Payer: Medicare PPO | Admitting: Podiatry

## 2022-09-02 ENCOUNTER — Ambulatory Visit: Payer: Medicare Other | Admitting: Internal Medicine

## 2022-09-02 ENCOUNTER — Encounter: Payer: Self-pay | Admitting: Internal Medicine

## 2022-09-02 VITALS — BP 120/82 | HR 110 | Temp 97.7°F | Ht 65.0 in | Wt 300.0 lb

## 2022-09-02 DIAGNOSIS — Z7409 Other reduced mobility: Secondary | ICD-10-CM

## 2022-09-02 DIAGNOSIS — Z96643 Presence of artificial hip joint, bilateral: Secondary | ICD-10-CM

## 2022-09-02 DIAGNOSIS — Z96651 Presence of right artificial knee joint: Secondary | ICD-10-CM | POA: Diagnosis not present

## 2022-09-02 DIAGNOSIS — I4819 Other persistent atrial fibrillation: Secondary | ICD-10-CM | POA: Diagnosis not present

## 2022-09-02 DIAGNOSIS — N3946 Mixed incontinence: Secondary | ICD-10-CM

## 2022-09-02 DIAGNOSIS — G4733 Obstructive sleep apnea (adult) (pediatric): Secondary | ICD-10-CM | POA: Insufficient documentation

## 2022-09-02 MED ORDER — TROSPIUM CHLORIDE 20 MG PO TABS: 20.0000 mg | ORAL_TABLET | Freq: Two times a day (BID) | ORAL | 2 refills | Status: DC

## 2022-09-02 MED ORDER — RIVAROXABAN 20 MG PO TABS: 20.0000 mg | ORAL_TABLET | Freq: Every day | ORAL | 1 refills | Status: DC

## 2022-09-02 NOTE — Assessment & Plan Note (Signed)
Multifactorial , including DJD with bilateral hip replacements, one knee replace,  autoimmune arthritis  and morbid obesity .   Bonnie Hinton  She cannot walk from the parking lot to the office.   Home PT ordered

## 2022-09-02 NOTE — Assessment & Plan Note (Signed)
Sleep study done  (home):  AHI fo 10 trial of CPAP offered and accepted.

## 2022-09-02 NOTE — Progress Notes (Signed)
Subjective:  Patient ID: Bonnie Hinton, female    DOB: July 19, 1940  Age: 82 y.o. MRN: [id number]  CC: The primary encounter diagnosis was Persistent atrial fibrillation (HCC). Diagnoses of Mixed stress and urge urinary incontinence, Mobility impaired, Status post right knee replacement, Status post bilateral hip replacements, and OSA (obstructive sleep apnea) were also pertinent to this visit.   HPI Bonnie Hinton presents for  Chief Complaint  Patient presents with   discuss sleep study results    Patient was diagnosed with asymptomatic atrial fibrillation at last visit. In early May.    Started on metoprolol and Xarelto and referred for sleep study , ZIO monitor ordered and worn for 14 days,  and referred to  cardiology   She has had the sleep study and the ZIO monitor,  but has not seen cardiology yet  because her daughter could not provide transportation  (she has been unable to walk despite having knee replacement > 1 year ago due to leg weakness,  morbid obesity  deconditioning  1) Chronic atrial fibrillation :  100% of hte time per ZIO monitor. Camie Patience xarelto , has been on it since diagnosis on May 8.  ZIO monitor notes average rate of 86 bpm.  Has not had the ECHO or the cardiology evaluation yet.    2) OSA:  sleep study was done over 2 nights  May 16/17.  AHI ws 10 and 7.6 with no desaturations <90% .  PFTS suggestive restrictive disease secondary to morbid obesity,  unable to walk fo rhte 6 mintue walk test.  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 She is ambivalent about wearing CPAP.  She has crhonic low back pain and spends the 2 'nd half in the recliner that is next to the bed   3) Morbid obesity: she got so anxious about her health that she went off her diet.  Her brother had OSa AND died of tongue CA ,  her father  and uncle also had  had CA.    4) Limited mobility/deconditioning :  she has cancelled PT/OT   for hands.  Wants home PT for strength of legs    Outpatient Medications Prior to Visit  Medication Sig Dispense Refill   acetaminophen (TYLENOL) 325 MG tablet Take 2 tablets (650 mg total) by mouth every 6 (six) hours as needed for mild pain (or Fever >/= 101).     amLODipine (NORVASC) 10 MG tablet Take 1 tablet (10 mg total) by mouth daily. 90 tablet 1   amoxicillin (AMOXIL) 500 MG capsule TAKE 4 CAPSULES BY MOUTH 1 HOUR PRIOR TO DENTAL PROCEDURE 4 capsule 2   cetirizine (ZYRTEC) 10 MG tablet Take 1 tablet (10 mg total) by mouth daily. 90 tablet 3   Cholecalciferol (VITAMIN D3) 2000 units TABS Take 1 tablet by mouth daily.     Continuous Blood Gluc Sensor (FREESTYLE LIBRE 2 SENSOR) MISC Apply 1 each topically every 14 (fourteen) days. 6 each 3   cyanocobalamin (VITAMIN B12) 1000 MCG/ML injection Inject 1,000 mcg into the muscle every 30 (thirty) days.     cycloSPORINE (RESTASIS) 0.05 % ophthalmic emulsion Place 1 drop into both eyes 2 (two) times daily.     dicyclomine (BENTYL) 20 MG tablet TAKE 1 TABLET FOUR TIMES DAILY BEFORE MEALS AND AT BEDTIME  360 tablet 0   DROPLET PEN NEEDLES 31G X 5 MM MISC USE DAILY 100 each 10   ezetimibe (ZETIA) 10 MG tablet Take 1 tablet (10 mg total) by mouth daily. 90 tablet 1   folic acid (FOLVITE) 1 MG tablet Take 1 mg by mouth daily.     furosemide (LASIX) 20 MG tablet Take 1 tablet (20 mg total) by mouth daily. 90 tablet 3   hydroxychloroquine (PLAQUENIL) 200 MG tablet Take 200 mg by mouth 2 (two) times daily.     insulin glargine (LANTUS SOLOSTAR) 100 UNIT/ML Solostar Pen Inject 25 Units into the skin daily. 30 mL 10   levothyroxine (SYNTHROID) 150 MCG tablet Take 1 tablet (150 mcg total) by mouth daily. 90 tablet 3   methotrexate (RHEUMATREX) 2.5 MG tablet Take 5 tablets by mouth once a week.     metoprolol tartrate (LOPRESSOR) 50 MG tablet Take 1 tablet (50 mg  total) by mouth 2 (two) times daily. 180 tablet 1   mupirocin ointment (BACTROBAN) 2 % APPLY TO AFFECTED AREA TWICE A DAY 22 g 11   nystatin cream (MYCOSTATIN) APPLY TO THE AFFECTED AREA(S) TWICE DAILY 90 g 2   pantoprazole (PROTONIX) 40 MG tablet Take 1 tablet (40 mg total) by mouth daily. 90 tablet 1   spironolactone (ALDACTONE) 50 MG tablet Take 1 tablet (50 mg total) by mouth daily. 90 tablet 3   tiZANidine (ZANAFLEX) 4 MG tablet TAKE 1 TABLET EVERY 6 HOURS AS NEEDED FOR MUSCLE SPASM(S) 270 tablet 3   rivaroxaban (XARELTO) 20 MG TABS tablet Take 1 tablet (20 mg total) by mouth daily with supper. 30 tablet 3   tolterodine (DETROL LA) 4 MG 24 hr capsule Take 1 capsule (4 mg total) by mouth 2 (two) times daily. 180 capsule 1   LUMIGAN 0.01 % SOLN Place 1-2 drops into both eyes at bedtime. (Patient not taking: Reported on 09/02/2022) 5 mL 0   trimethoprim-polymyxin b (POLYTRIM) ophthalmic solution PLACE 1 DROP INTO THE LEFT EYE EVERY 6 HOURS. (Patient not taking: Reported on 09/02/2022) 10 mL 0   No facility-administered medications prior to visit.    Review of Systems;  Patient denies headache, fevers, malaise, unintentional weight loss, skin rash, eye pain, sinus congestion and sinus pain, sore throat, dysphagia,  hemoptysis , cough, dyspnea, wheezing, chest pain, palpitations, orthopnea, edema, abdominal pain, nausea, melena, diarrhea, constipation, flank pain, dysuria, hematuria, urinary  Frequency, nocturia, numbness, tingling, seizures,  Focal weakness, Loss of consciousness,  Tremor, insomnia, depression, anxiety, and suicidal ideation.      Objective:  BP 120/82   Pulse (!) 110   Temp 97.7 F (36.5 C) (Oral)   Ht 5\' 5"  (1.651 m)   Wt 300 lb (136.1 kg)   SpO2 96%   BMI 49.92 kg/m   BP Readings from Last 3 Encounters:  09/02/22 120/82  07/31/22 138/74  05/23/22 (!) 143/90    Wt Readings from Last 3 Encounters:  09/02/22 300 lb (136.1 kg)  07/31/22 300 lb (136.1 kg)   04/29/22 300 lb (136.1 kg)    Physical Exam Vitals reviewed.  Constitutional:      General: She is not in acute distress.    Appearance: Normal appearance. She is morbidly obese. She is not ill-appearing, toxic-appearing or diaphoretic.     Comments: Seated in a wheelchair   HENT:     Head: Normocephalic.  Eyes:     General: No scleral icterus.       Right eye: No  discharge.        Left eye: No discharge.     Conjunctiva/sclera: Conjunctivae normal.  Cardiovascular:     Rate and Rhythm: Normal rate. Rhythm irregular.     Heart sounds: Normal heart sounds.  Pulmonary:     Effort: Pulmonary effort is normal. No respiratory distress.     Breath sounds: Normal breath sounds.  Musculoskeletal:        General: Normal range of motion.  Skin:    General: Skin is warm and dry.  Neurological:     General: No focal deficit present.     Mental Status: She is alert and oriented to person, place, and time. Mental status is at baseline.  Psychiatric:        Mood and Affect: Mood normal.        Behavior: Behavior normal.        Thought Content: Thought content normal.        Judgment: Judgment normal.    Lab Results  Component Value Date   HGBA1C 6.9 (A) 07/31/2022   HGBA1C 7.3 (H) 04/25/2022   HGBA1C 7.3 (H) 01/22/2022    Lab Results  Component Value Date   CREATININE 0.78 07/31/2022   CREATININE 0.92 04/25/2022   CREATININE 0.89 01/22/2022    Lab Results  Component Value Date   WBC 8.4 06/10/2018   HGB 14.9 06/10/2018   HCT 45.1 06/10/2018   PLT 306.0 06/10/2018   GLUCOSE 137 (H) 07/31/2022   CHOL 195 07/31/2022   TRIG 181.0 (H) 07/31/2022   HDL 40.90 07/31/2022   LDLDIRECT 128.0 07/31/2022   LDLCALC 118 (H) 07/31/2022   ALT 13 07/31/2022   AST 17 07/31/2022   NA 135 07/31/2022   K 4.4 07/31/2022   CL 100 07/31/2022   CREATININE 0.78 07/31/2022   BUN 16 07/31/2022   CO2 26 07/31/2022   TSH 1.53 07/31/2022   INR 1.37 09/04/2016   HGBA1C 6.9 (A) 07/31/2022    MICROALBUR 1.9 07/31/2022    MM DIAG BREAST TOMO UNI LEFT  Result Date: 03/06/2022 CLINICAL DATA:  BI-RADS 3 follow-up of 2 adjacent LEFT breast masses favored to reflect intramammary lymph nodes. EXAM: DIGITAL DIAGNOSTIC UNILATERAL LEFT MAMMOGRAM WITH TOMOSYNTHESIS; ULTRASOUND LEFT BREAST LIMITED TECHNIQUE: Left digital diagnostic mammography and breast tomosynthesis was performed.; Targeted ultrasound examination of the left breast was performed. Best images possible per technologist communication. COMPARISON:  Previous exam(s). ACR Breast Density Category a: The breast tissue is almost entirely fatty. FINDINGS: Diagnostic images LEFT breast demonstrate mammographic stability of 3 adjacent oval circumscribed masses in the LEFT upper outer breast at middle to posterior depth. The larger of the masses has been previously characterized as a benign intramammary lymph node. No new suspicious findings are noted in the LEFT breast. Targeted ultrasound was performed the LEFT upper outer breast. At 1 o'clock 8 cm from nipple, there is revisualization of an oval circumscribed mass. It measures 4 x 3 x 5 mm, previously 4 x 3 x 5 mm. Adjacent mass measures 3 x 4 x 2 mm, previously 3 x 4 x 2 mm. They demonstrate suggestion of internal echogenic hila. IMPRESSION: There are 2 stable probably benign masses likely reflecting benign intramammary lymph nodes. Recommend follow-up diagnostic mammogram (with ultrasound if deemed necessary) in 6 months. This will establish 1 year of definitive stability from baseline. RECOMMENDATION: Bilateral diagnostic mammogram (with RIGHT and LEFT breast ultrasound if deemed necessary) in 6 months. I have discussed the findings and recommendations with the patient.  If applicable, a reminder letter will be sent to the patient regarding the next appointment. BI-RADS CATEGORY  3: Probably benign. Electronically Signed   By: Meda Klinefelter M.D.   On: 03/06/2022 10:36  US BREAST LTD UNI LEFT  INC AXILLA  Result Date: 03/06/2022 CLINICAL DATA:  BI-RADS 3 follow-up of 2 adjacent LEFT breast masses favored to reflect intramammary lymph nodes. EXAM: DIGITAL DIAGNOSTIC UNILATERAL LEFT MAMMOGRAM WITH TOMOSYNTHESIS; ULTRASOUND LEFT BREAST LIMITED TECHNIQUE: Left digital diagnostic mammography and breast tomosynthesis was performed.; Targeted ultrasound examination of the left breast was performed. Best images possible per technologist communication. COMPARISON:  Previous exam(s). ACR Breast Density Category a: The breast tissue is almost entirely fatty. FINDINGS: Diagnostic images LEFT breast demonstrate mammographic stability of 3 adjacent oval circumscribed masses in the LEFT upper outer breast at middle to posterior depth. The larger of the masses has been previously characterized as a benign intramammary lymph node. No new suspicious findings are noted in the LEFT breast. Targeted ultrasound was performed the LEFT upper outer breast. At 1 o'clock 8 cm from nipple, there is revisualization of an oval circumscribed mass. It measures 4 x 3 x 5 mm, previously 4 x 3 x 5 mm. Adjacent mass measures 3 x 4 x 2 mm, previously 3 x 4 x 2 mm. They demonstrate suggestion of internal echogenic hila. IMPRESSION: There are 2 stable probably benign masses likely reflecting benign intramammary lymph nodes. Recommend follow-up diagnostic mammogram (with ultrasound if deemed necessary) in 6 months. This will establish 1 year of definitive stability from baseline. RECOMMENDATION: Bilateral diagnostic mammogram (with RIGHT and LEFT breast ultrasound if deemed necessary) in 6 months. I have discussed the findings and recommendations with the patient. If applicable, a reminder letter will be sent to the patient regarding the next appointment. BI-RADS CATEGORY  3: Probably benign. Electronically Signed   By: Meda Klinefelter M.D.   On: 03/06/2022 10:36   Assessment & Plan:  .Persistent atrial fibrillation (HCC) Assessment  & Plan: Continue metoprolol and xarelto.  She will be anticoagulated for > 6 weeks by th etime she sees cardiology.  ECHO ordered.  Sleep study done  trial of CPAP offered and accepted.    Orders: -     ECHOCARDIOGRAM COMPLETE; Future  Mixed stress and urge urinary incontinence Assessment & Plan: Managed with Detrol LA 4 mg bid. Needs generic per insurance Detrol LA    Mobility impaired Assessment & Plan: Multifactorial , including DJD with bilateral hip replacements, one knee replace,  autoimmune arthritis  and morbid obesity .   Marland Kitchen  She cannot walk from the parking lot to the office.   Home PT ordered  Orders: -     Home Health -     Face-to-face encounter (required for Medicare/Medicaid patients)  Status post right knee replacement -     Home Health  Status post bilateral hip replacements -     Home Health  OSA (obstructive sleep apnea) Assessment & Plan:  Sleep study done  (home):  AHI fo 10 trial of CPAP offered and accepted.     Other orders -     Trospium Chloride; Take 1 tablet (20 mg total) by mouth 2 (two) times daily.  Dispense: 180 tablet; Refill: 2 -     Rivaroxaban; Take 1 tablet (20 mg total) by mouth daily with supper.  Dispense: 90 tablet; Refill: 1     I provided  a total of 55  minutes of face-to-face time during this encounter reviewing  patient's last visit with me, patient's ZIO monitor report,  sleep study.  previous  labs and imaging studies, counseling on currently addressed issues,  and post visit ordering to diagnostics and therapeutics .   Follow-up: No follow-ups on file.   Sherlene Shams, MD

## 2022-09-02 NOTE — Assessment & Plan Note (Addendum)
Managed with Detrol LA 4 mg bid. Needs generic per insurance Detrol LA

## 2022-09-02 NOTE — Patient Instructions (Addendum)
   I have ordered your ECHO Cardiogram to be done at [facility])   PRIOR to your visit with Dr Myriam Forehand on July 18  Continue xarelto and metoprolol .    We are changing  Detrol LA to Reunion (trospium is the generic form ) because insurance will not allow you to have #60/month   Home health physical therapy will be ordered   You have mild sleep apnea based on the home study  so CPAP will be ordered for you to use at night

## 2022-09-02 NOTE — Assessment & Plan Note (Signed)
Continue metoprolol and xarelto.  She will be anticoagulated for > 6 weeks by th etime she sees cardiology.  ECHO ordered.  Sleep study done  trial of CPAP offered and accepted.

## 2022-09-03 ENCOUNTER — Other Ambulatory Visit (HOSPITAL_COMMUNITY): Payer: Self-pay

## 2022-09-05 ENCOUNTER — Encounter: Payer: Medicare PPO | Admitting: Physical Therapy

## 2022-09-09 ENCOUNTER — Telehealth: Payer: Self-pay

## 2022-09-09 NOTE — Telephone Encounter (Signed)
Cliemy called from [employer] to request documentation for a wheelchair for patient.  Cliemy states she will fax the request to [place].  I gave her our fax number.

## 2022-09-09 NOTE — Telephone Encounter (Signed)
Placed in quick sign folder.  

## 2022-09-16 ENCOUNTER — Ambulatory Visit: Payer: Self-pay | Admitting: Podiatry

## 2022-09-16 NOTE — Telephone Encounter (Signed)
[employer] called back. They stated they need the most recent OV notes and certificate of medical necessity. They will also fax over additional form for provider to sign.

## 2022-09-19 ENCOUNTER — Ambulatory Visit: Payer: Medicare Other | Admitting: Podiatry

## 2022-09-19 ENCOUNTER — Encounter: Payer: Self-pay | Admitting: Podiatry

## 2022-09-19 VITALS — BP 132/81 | HR 78

## 2022-09-19 DIAGNOSIS — E1142 Type 2 diabetes mellitus with diabetic polyneuropathy: Secondary | ICD-10-CM | POA: Diagnosis not present

## 2022-09-19 DIAGNOSIS — M79676 Pain in unspecified toe(s): Secondary | ICD-10-CM | POA: Diagnosis not present

## 2022-09-19 DIAGNOSIS — B351 Tinea unguium: Secondary | ICD-10-CM | POA: Diagnosis not present

## 2022-09-19 NOTE — Telephone Encounter (Signed)
faxed

## 2022-09-21 ENCOUNTER — Other Ambulatory Visit: Payer: Self-pay | Admitting: Internal Medicine

## 2022-09-23 NOTE — Progress Notes (Signed)
  Subjective:  Patient ID: Bonnie Hinton, female    DOB: May 30, 1940,  MRN: [id number]  Bonnie Hinton presents to clinic today for at risk foot care with history of diabetic neuropathy and painful thick toenails that are difficult to trim. Pain interferes with ambulation. Aggravating factors include wearing enclosed shoe gear. Pain is relieved with periodic professional debridement.  Chief Complaint  Patient presents with   Diabetes    "Cut my nails." Dr. Darrick Huntsman - 07/31/2022, glucose - 137 mg/dl - yesterday   New problem(s): None.   PCP is Sherlene Shams, MD.  Allergies  Allergen Reactions   Erythromycin Rash   Fentanyl Other (See Comments)    Duragesic-25 Duragesic-25   Iodinated Contrast Media Swelling and Rash   Atorvastatin     "stomach problems"   Metformin And Related Other (See Comments)    Diarrhea even w/ XR metformin   Victoza [Liraglutide]     Suspected to be the cause of gallstone pancreatitis June 2018   Latex Itching and Rash   Lisinopril Other (See Comments)    Hyperkalemia    Other Rash    Paper tape, possibly adhesive tape PAPER TAPE     Review of Systems: Negative except as noted in the HPI.  Objective: No changes noted in today's physical examination. Vitals:   09/19/22 0955  BP: 132/81  Pulse: 78   Bonnie Hinton is a pleasant 82 y.o. female morbidly obese in NAD. AAO x 3.  Vascular Examination: CFT <4 seconds b/l. DP/PT pulses diminished b/l due to swelling. Digital hair absent. Skin temperature gradient warm to cool b/l. No ischemia or gangrene. No cyanosis or clubbing noted b/l.    Neurological Examination: Sensation grossly intact b/l with 10 gram monofilament. Vibratory sensation intact b/l.   Dermatological Examination: Pedal skin thin, shiny and atrophic b/l. No open wounds. No interdigital macerations.   Toenails 1-5 b/l thick, discolored, elongated with subungual debris and pain on dorsal palpation.   No  hyperkeratotic nor porokeratotic lesions present on today's visit.  Musculoskeletal Examination: Muscle strength 5/5 to b/l LE.  Hallux hammertoe left foot. Hammertoe(s) noted to the bilateral 2nd toes and bilateral 3rd toes. Pes planovalgus deformity noted left foot.  Radiographs: None  Last A1c:      Latest Ref Rng & Units 07/31/2022   11:25 AM 04/25/2022   10:43 AM 01/22/2022    9:00 AM 10/23/2021    8:43 AM  Hemoglobin A1C  Hemoglobin-A1c 4.0 - 5.6 % 6.9  7.3  7.3  7.2    Assessment/Plan: 1. Pain due to onychomycosis of toenail   2. Diabetic polyneuropathy associated with type 2 diabetes mellitus (HCC)     Patient was evaluated and treated. All patient's and/or POA's questions/concerns addressed on today's visit. Toenails 1-5 debrided in length and girth without incident. Continue soft, supportive shoe gear daily. Report any pedal injuries to medical professional. Call office if there are any questions/concerns. -Patient/POA to call should there be question/concern in the interim.   Return in about 3 months (around 12/20/2022).  Freddie Breech, DPM

## 2022-09-27 ENCOUNTER — Ambulatory Visit
Admission: RE | Admit: 2022-09-27 | Discharge: 2022-09-27 | Disposition: A | Discharge status: Home / Self Care | Payer: Medicare Other | Source: Ambulatory Visit | Attending: Internal Medicine | Admitting: Internal Medicine

## 2022-09-27 DIAGNOSIS — R928 Other abnormal and inconclusive findings on diagnostic imaging of breast: Secondary | ICD-10-CM | POA: Insufficient documentation

## 2022-09-27 DIAGNOSIS — N63 Unspecified lump in unspecified breast: Secondary | ICD-10-CM

## 2022-10-07 ENCOUNTER — Ambulatory Visit
Admission: RE | Admit: 2022-10-07 | Discharge: 2022-10-07 | Disposition: A | Discharge status: Home / Self Care | Payer: Medicare Other | Source: Ambulatory Visit | Attending: Internal Medicine | Admitting: Internal Medicine

## 2022-10-07 DIAGNOSIS — I358 Other nonrheumatic aortic valve disorders: Secondary | ICD-10-CM | POA: Insufficient documentation

## 2022-10-07 DIAGNOSIS — I3481 Nonrheumatic mitral (valve) annulus calcification: Secondary | ICD-10-CM | POA: Insufficient documentation

## 2022-10-07 DIAGNOSIS — I4819 Other persistent atrial fibrillation: Secondary | ICD-10-CM | POA: Insufficient documentation

## 2022-10-07 LAB — ECHOCARDIOGRAM COMPLETE
AR max vel: 2.51 cm2
AV Area VTI: 2.49 cm2
AV Area mean vel: 2.53 cm2
AV Mean grad: 4 mmHg
AV Peak grad: 6.8 mmHg
Ao pk vel: 1.3 m/s
Area-P 1/2: 2.71 cm2
MV VTI: 2.74 cm2
S' Lateral: 3.2 cm

## 2022-10-07 NOTE — Progress Notes (Signed)
*  PRELIMINARY RESULTS* Echocardiogram 2D Echocardiogram has been performed.  Carolyne Fiscal 10/07/2022, 11:36 AM

## 2022-10-10 ENCOUNTER — Encounter: Payer: Self-pay | Admitting: Cardiology

## 2022-10-10 ENCOUNTER — Ambulatory Visit: Payer: Medicare Other | Attending: Cardiology | Admitting: Cardiology

## 2022-10-10 VITALS — BP 116/68 | HR 63 | Ht 65.0 in | Wt 306.0 lb

## 2022-10-10 DIAGNOSIS — E782 Mixed hyperlipidemia: Secondary | ICD-10-CM | POA: Diagnosis not present

## 2022-10-10 DIAGNOSIS — I10 Essential (primary) hypertension: Secondary | ICD-10-CM

## 2022-10-10 DIAGNOSIS — I480 Paroxysmal atrial fibrillation: Secondary | ICD-10-CM

## 2022-10-10 NOTE — Progress Notes (Signed)
Cardiology Office Note:    Date:  10/10/2022   ID:  RESHONDA KOERBER, DOB Nov 05, 1940, MRN [id number]  PCP:  Sherlene Shams, MD   [facility] Providers Cardiologist:  Debbe Odea, MD     Referring MD: Sherlene Shams, MD   Chief Complaint  Patient presents with   New Patient (Initial Visit)    Referred for a-fib evaluation.  Cardiac history with last visit in 2017 with Almond Lint, MD.  Patient had echo on 10/07/22.      History of Present Illness:    Bonnie Hinton is a 82 y.o. female with a hx of paroxysmal A-fib, morbid obesity, OSA on CPAP, hypertension, who presents due to atrial fibrillation.  She was diagnosed with atrial fibrillation by her primary care provider during a routine visit in May 2024.  She denies palpitations.  She was started on metoprolol and Xarelto.  Due to being morbid obese, sleep study was performed and patient diagnosed with sleep apnea.  Cardiac monitor also placed to document A-fib burden.  Monitor did reveal persistent A-fib throughout monitor duration.  She states using CPAP mask consistently over the past 3 to 4 weeks.  Is working on losing weight, will not want any additional medications.  Did not tolerate Lipitor in the past  Echocardiogram 7/24 EF 55 to 60%, impaired relaxation. Cardiac monitor 6/24 showed persistent A-fib.  Past Medical History:  Diagnosis Date   Anxiety    Arthritis    Choledocholithiasis    Chronic back pain    stenosis and arthritis   Community acquired pneumonia 05/29/2021   Complication of anesthesia    anxious when she wakes up   GERD (gastroesophageal reflux disease)    takes Pantoprazole daily   Glaucoma    dry angle   History of bronchitis    30+ yrs ago   History of MRSA infection 2004   several yrs ago   Hyperlipidemia    takes Zetia and Crestor daily   Hypertension    take Amlodipine daily   Hypothyroidism    takes Synthroid daily   Joint pain    Joint swelling    Neuropathy     takes Gabapentin daily   Pancreatitis, gallstone    Peripheral edema    takes Furosemide daily as needed   Pneumonia    hx of-30 + yrs ago   Pre-diabetes    takes Victoza daily   Primary localized osteoarthritis of right knee    Subdural hematoma (HCC) 8 yrs ago   hx of   Urinary frequency    Urinary urgency     Past Surgical History:  Procedure Laterality Date   ABDOMINAL HYSTERECTOMY     CHOLECYSTECTOMY N/A 09/09/2016   Procedure: LAPAROSCOPIC CHOLECYSTECTOMY;  Surgeon: Manus Rudd, MD;  Location: [facility] OR;  Service: General;  Laterality: N/A;   COLONOSCOPY     RADIOLOGY WITH ANESTHESIA N/A 08/22/2015   Procedure: MRI OF LUMBAR SPINE   (RADIOLOGY WITH ANESTHESIA);  Surgeon: Medication Radiologist, MD;  Location: [facility] OR;  Service: Radiology;  Laterality: N/A;   TOTAL HIP ARTHROPLASTY Right    TOTAL HIP ARTHROPLASTY Left    TOTAL KNEE ARTHROPLASTY Left    TOTAL KNEE ARTHROPLASTY Right 01/01/2016   Procedure: TOTAL KNEE ARTHROPLASTY;  Surgeon: Salvatore Marvel, MD;  Location: [facility] OR;  Service: Orthopedics;  Laterality: Right;    Current Medications: Current Meds  Medication Sig   acetaminophen (TYLENOL) 325 MG tablet Take 2 tablets (650 mg total)  by mouth every 6 (six) hours as needed for mild pain (or Fever >/= 101).   amLODipine (NORVASC) 10 MG tablet Take 1 tablet (10 mg total) by mouth daily.   amoxicillin (AMOXIL) 500 MG capsule TAKE 4 CAPSULES BY MOUTH 1 HOUR PRIOR TO DENTAL PROCEDURE   cetirizine (ZYRTEC) 10 MG tablet Take 1 tablet (10 mg total) by mouth daily.   Cholecalciferol (VITAMIN D3) 2000 units TABS Take 1 tablet by mouth daily.   Continuous Blood Gluc Sensor (FREESTYLE LIBRE 2 SENSOR) MISC Apply 1 each topically every 14 (fourteen) days.   cyanocobalamin (VITAMIN B12) 1000 MCG/ML injection Inject 1,000 mcg into the muscle every 30 (thirty) days.   cycloSPORINE (RESTASIS) 0.05 % ophthalmic emulsion Place 1 drop into both eyes 2 (two) times daily.   dicyclomine  (BENTYL) 20 MG tablet TAKE 1 TABLET BY MOUTH FOUR TIMES DAILY BEFORE MEALS AND AT BEDTIME   DROPLET PEN NEEDLES 31G X 5 MM MISC USE DAILY   ezetimibe (ZETIA) 10 MG tablet Take 1 tablet (10 mg total) by mouth daily.   folic acid (FOLVITE) 1 MG tablet Take 1 mg by mouth daily.   furosemide (LASIX) 20 MG tablet Take 1 tablet (20 mg total) by mouth daily.   hydroxychloroquine (PLAQUENIL) 200 MG tablet Take 200 mg by mouth 2 (two) times daily.   insulin glargine (LANTUS SOLOSTAR) 100 UNIT/ML Solostar Pen Inject 25 Units into the skin daily.   levothyroxine (SYNTHROID) 150 MCG tablet Take 1 tablet (150 mcg total) by mouth daily.   methotrexate (RHEUMATREX) 2.5 MG tablet Take 5 tablets by mouth once a week.   metoprolol tartrate (LOPRESSOR) 50 MG tablet Take 1 tablet (50 mg total) by mouth 2 (two) times daily.   mupirocin ointment (BACTROBAN) 2 % APPLY TO AFFECTED AREA TWICE A DAY   nystatin cream (MYCOSTATIN) APPLY TO THE AFFECTED AREA(S) TWICE DAILY   pantoprazole (PROTONIX) 40 MG tablet Take 1 tablet (40 mg total) by mouth daily.   rivaroxaban (XARELTO) 20 MG TABS tablet Take 1 tablet (20 mg total) by mouth daily with supper.   spironolactone (ALDACTONE) 50 MG tablet Take 1 tablet (50 mg total) by mouth daily.   tiZANidine (ZANAFLEX) 4 MG tablet TAKE 1 TABLET EVERY 6 HOURS AS NEEDED FOR MUSCLE SPASM(S)   trospium (SANCTURA) 20 MG tablet Take 1 tablet (20 mg total) by mouth 2 (two) times daily.     Allergies:   Erythromycin, Fentanyl, Iodinated contrast media, Atorvastatin, Metformin and related, Victoza [liraglutide], Latex, Lisinopril, and Other   Social History   Socioeconomic History   Marital status: Widowed    Spouse name: Not on file   Number of children: Not on file   Years of education: Not on file   Highest education level: Not on file  Occupational History   Not on file  Tobacco Use   Smoking status: Never   Smokeless tobacco: Never  Vaping Use   Vaping status: Never Used   Substance and Sexual Activity   Alcohol use: No    Alcohol/week: 0.0 standard drinks of alcohol   Drug use: No   Sexual activity: Not on file  Other Topics Concern   Not on file  Social History Narrative   Not on file   Social Determinants of Health   Financial Resource Strain: Low Risk  (03/22/2022)   Overall Financial Resource Strain (CARDIA)    Difficulty of Paying Living Expenses: Not hard at all  Food Insecurity: No Food Insecurity (03/22/2022)  Hunger Vital Sign    Worried About Running Out of Food in the Last Year: Never true    Ran Out of Food in the Last Year: Never true  Transportation Needs: No Transportation Needs (03/22/2022)   PRAPARE - Administrator, Civil Service (Medical): No    Lack of Transportation (Non-Medical): No  Physical Activity: Unknown (03/22/2022)   Exercise Vital Sign    Days of Exercise per Week: 7 days    Minutes of Exercise per Session: Not on file  Stress: No Stress Concern Present (03/22/2022)   [employer] of Occupational Health - Occupational Stress Questionnaire    Feeling of Stress : Not at all  Social Connections: Unknown (03/22/2022)   Social Connection and Isolation Panel [NHANES]    Frequency of Communication with Friends and Family: More than three times a week    Frequency of Social Gatherings with Friends and Family: More than three times a week    Attends Religious Services: Not on Marketing executive or Organizations: Not on file    Attends Banker Meetings: Never    Marital Status: Not on file     Family History: The patient's family history includes Cancer in her brother and father; Diabetes in her maternal aunt. There is no history of Breast cancer.  ROS:   Please see the history of present illness.     All other systems reviewed and are negative.  EKGs/Labs/Other Studies Reviewed:    The following studies were reviewed today:  EKG Interpretation Date/Time:  Thursday  October 10 2022 10:56:22 EDT Ventricular Rate:  63 PR Interval:  178 QRS Duration:  80 QT Interval:  392 QTC Calculation: 401 R Axis:   -12  Text Interpretation: Normal sinus rhythm Normal ECG Confirmed by Debbe Odea ([id number]) on 10/10/2022 11:07:10 AM    Recent Labs: 07/31/2022: ALT 13; BUN 16; Creatinine, Ser 0.78; Potassium 4.4; Sodium 135; TSH 1.53  Recent Lipid Panel    Component Value Date/Time   CHOL 195 07/31/2022 1105   TRIG 181.0 (H) 07/31/2022 1105   HDL 40.90 07/31/2022 1105   CHOLHDL 5 07/31/2022 1105   VLDL 36.2 07/31/2022 1105   LDLCALC 118 (H) 07/31/2022 1105   LDLDIRECT 128.0 07/31/2022 1105     Risk Assessment/Calculations:             Physical Exam:    VS:  BP 116/68 (BP Location: Left Arm, Patient Position: Sitting, Cuff Size: Large)   Pulse 63   Ht 5\' 5"  (1.651 m)   Wt (!) 306 lb (138.8 kg)   SpO2 96%   BMI 50.92 kg/m     Wt Readings from Last 3 Encounters:  10/10/22 (!) 306 lb (138.8 kg)  09/02/22 300 lb (136.1 kg)  07/31/22 300 lb (136.1 kg)     GEN:  Well nourished, well developed in no acute distress HEENT: Normal NECK: No JVD; No carotid bruits CARDIAC: RRR, no murmurs, rubs, gallops RESPIRATORY:  Clear to auscultation without rales, wheezing or rhonchi  ABDOMEN: Soft, non-tender, distended MUSCULOSKELETAL: Lymph edema; No deformity  SKIN: Warm and dry NEUROLOGIC:  Alert and oriented x 3 PSYCHIATRIC:  Normal affect   ASSESSMENT:    1. Paroxysmal atrial fibrillation (HCC)   2. Primary hypertension   3. Morbid obesity (HCC)   4. Mixed hyperlipidemia    PLAN:    In order of problems listed above:  Paroxysmal atrial fibrillation, EKG today showing sinus rhythm.  Combination of beta-blocker and CPAP compliance over the past month possibly helping with rhythm control.  Morbid obesity, BMI 50, likely not candidate for rhythm control via ablation or AAD.  Patient clinically asymptomatic.  Continue metoprolol 50 mg twice daily,  Xarelto 20 mg daily.  Echo with normal EF 55 to 60%. Hypertension, BP controlled.  Continue Lopressor, Norvasc, Aldactone. Morbid obesity, low-calorie diet, weight loss advised.  Ozempic suggested, patient declined any new medications. Hyperlipidemia, not tolerant to Lipitor.  Continue Zetia.  Weight loss as above.  Follow-up in 6 months.      Medication Adjustments/Labs and Tests Ordered: Current medicines are reviewed at length with the patient today.  Concerns regarding medicines are outlined above.  Orders Placed This Encounter  Procedures   EKG 12-Lead   No orders of the defined types were placed in this encounter.   Patient Instructions  Medication Instructions:   Your physician recommends that you continue on your current medications as directed. Please refer to the Current Medication list given to you today.  *If you need a refill on your cardiac medications before your next appointment, please call your pharmacy*   Lab Work:  None Ordered  If you have labs (blood work) drawn today and your tests are completely normal, you will receive your results only by: MyChart Message (if you have MyChart) OR A paper copy in the mail If you have any lab test that is abnormal or we need to change your treatment, we will call you to review the results.   Testing/Procedures:  None Ordered   Follow-Up: At [facility], you and your health needs are our priority.  As part of our continuing mission to provide you with exceptional heart care, we have created designated Provider Care Teams.  These Care Teams include your primary Cardiologist (physician) and Advanced Practice Providers (APPs -  Physician Assistants and Nurse Practitioners) who all work together to provide you with the care you need, when you need it.  We recommend signing up for the patient portal called "MyChart".  Sign up information is provided on this After Visit Summary.  MyChart is used to connect with  patients for Virtual Visits (Telemedicine).  Patients are able to view lab/test results, encounter notes, upcoming appointments, etc.  Non-urgent messages can be sent to your provider as well.   To learn more about what you can do with MyChart, go to [email or web address].    Your next appointment:   6 month(s)  Provider:   You may see Debbe Odea, MD or one of the following Advanced Practice Providers on your designated Care Team:   Nicolasa Ducking, NP Eula Listen, PA-C Cadence Fransico Michael, PA-C Charlsie Quest, NP   Signed, Debbe Odea, MD  10/10/2022 12:12 PM    [facility]

## 2022-10-10 NOTE — Patient Instructions (Signed)

## 2022-10-21 ENCOUNTER — Telehealth: Payer: Self-pay | Admitting: Internal Medicine

## 2022-10-21 DIAGNOSIS — E039 Hypothyroidism, unspecified: Secondary | ICD-10-CM

## 2022-10-21 DIAGNOSIS — Z7960 Long term (current) use of unspecified immunomodulators and immunosuppressants: Secondary | ICD-10-CM

## 2022-10-21 DIAGNOSIS — T50905A Adverse effect of unspecified drugs, medicaments and biological substances, initial encounter: Secondary | ICD-10-CM

## 2022-10-21 DIAGNOSIS — E559 Vitamin D deficiency, unspecified: Secondary | ICD-10-CM

## 2022-10-21 DIAGNOSIS — E1169 Type 2 diabetes mellitus with other specified complication: Secondary | ICD-10-CM

## 2022-10-21 NOTE — Telephone Encounter (Signed)
Patient need lab orders.

## 2022-10-28 ENCOUNTER — Telehealth: Payer: Self-pay | Admitting: Internal Medicine

## 2022-10-28 ENCOUNTER — Other Ambulatory Visit (INDEPENDENT_AMBULATORY_CARE_PROVIDER_SITE_OTHER): Payer: Medicare Other

## 2022-10-28 DIAGNOSIS — E1169 Type 2 diabetes mellitus with other specified complication: Secondary | ICD-10-CM | POA: Diagnosis not present

## 2022-10-28 DIAGNOSIS — E785 Hyperlipidemia, unspecified: Secondary | ICD-10-CM | POA: Diagnosis not present

## 2022-10-28 DIAGNOSIS — Z794 Long term (current) use of insulin: Secondary | ICD-10-CM | POA: Diagnosis not present

## 2022-10-28 DIAGNOSIS — E039 Hypothyroidism, unspecified: Secondary | ICD-10-CM

## 2022-10-28 DIAGNOSIS — Z7960 Long term (current) use of unspecified immunomodulators and immunosuppressants: Secondary | ICD-10-CM | POA: Diagnosis not present

## 2022-10-28 DIAGNOSIS — E559 Vitamin D deficiency, unspecified: Secondary | ICD-10-CM

## 2022-10-28 LAB — COMPREHENSIVE METABOLIC PANEL
ALT: 11 U/L (ref 0–35)
AST: 16 U/L (ref 0–37)
Albumin: 4.3 g/dL (ref 3.5–5.2)
Alkaline Phosphatase: 76 U/L (ref 39–117)
BUN: 16 mg/dL (ref 6–23)
CO2: 24 mEq/L (ref 19–32)
Calcium: 9.3 mg/dL (ref 8.4–10.5)
Chloride: 102 mEq/L (ref 96–112)
Creatinine, Ser: 0.95 mg/dL (ref 0.40–1.20)
GFR: 56.08 mL/min — ABNORMAL LOW (ref >60.00)
Glucose, Bld: 148 mg/dL — ABNORMAL HIGH (ref 70–99)
Potassium: 4.4 mEq/L (ref 3.5–5.1)
Sodium: 137 mEq/L (ref 135–145)
Total Bilirubin: 0.4 mg/dL (ref 0.2–1.2)
Total Protein: 7.8 g/dL (ref 6.0–8.3)

## 2022-10-28 LAB — CBC WITH DIFFERENTIAL/PLATELET
Basophils Absolute: 0.1 10*3/uL (ref 0.0–0.1)
Basophils Relative: 1.1 % (ref 0.0–3.0)
Eosinophils Absolute: 0.3 10*3/uL (ref 0.0–0.7)
Eosinophils Relative: 3.3 % (ref 0.0–5.0)
HCT: 44 % (ref 36.0–46.0)
Hemoglobin: 14.2 g/dL (ref 12.0–15.0)
Lymphocytes Relative: 14.1 % (ref 12.0–46.0)
Lymphs Abs: 1.1 10*3/uL (ref 0.7–4.0)
MCHC: 32.3 g/dL (ref 30.0–36.0)
MCV: 96 fl (ref 78.0–100.0)
Monocytes Absolute: 0.6 10*3/uL (ref 0.1–1.0)
Monocytes Relative: 7.3 % (ref 3.0–12.0)
Neutro Abs: 5.7 10*3/uL (ref 1.4–7.7)
Neutrophils Relative %: 74.2 % (ref 43.0–77.0)
Platelets: 293 10*3/uL (ref 150.0–400.0)
RBC: 4.59 Mil/uL (ref 3.87–5.11)
RDW: 14.7 % (ref 11.5–15.5)
WBC: 7.7 10*3/uL (ref 4.0–10.5)

## 2022-10-28 LAB — LIPID PANEL
Cholesterol: 227 mg/dL — ABNORMAL HIGH (ref 0–200)
HDL: 48.3 mg/dL (ref >39.00)
LDL Cholesterol: 142 mg/dL — ABNORMAL HIGH (ref 0–99)
NonHDL: 179.17
Total CHOL/HDL Ratio: 5
Triglycerides: 187 mg/dL — ABNORMAL HIGH (ref 0.0–149.0)
VLDL: 37.4 mg/dL (ref 0.0–40.0)

## 2022-10-28 LAB — VITAMIN D 25 HYDROXY (VIT D DEFICIENCY, FRACTURES): VITD: 37.51 ng/mL (ref 30.00–100.00)

## 2022-10-28 LAB — TSH: TSH: 8.63 u[IU]/mL — ABNORMAL HIGH (ref 0.35–5.50)

## 2022-10-28 LAB — HEMOGLOBIN A1C: Hgb A1c MFr Bld: 7.4 % — ABNORMAL HIGH (ref 4.6–6.5)

## 2022-10-28 MED ORDER — FREESTYLE LIBRE 2 SENSOR MISC: 1.0000 | 3 refills | Status: DC

## 2022-10-28 NOTE — Telephone Encounter (Signed)
refilled 

## 2022-10-28 NOTE — Telephone Encounter (Signed)
.  [facility] fax number is [phone], Continuous Blood Gluc Sensor (FREESTYLE LIBRE 2 SENSOR) MISC  Patient is having her Malachy Moan filled there, per Jeralyn Bennett.

## 2022-10-30 ENCOUNTER — Telehealth: Payer: Self-pay

## 2022-10-30 NOTE — Telephone Encounter (Signed)
Patient called to reschedule her appointment today.  I rescheduled her appointment.  Patient states she would like to be sure we have only [facility] in [place], [place] (Mail order) as her only pharmacy.  Patient states she had to change her pharmacy when her insurance changed back in May of this year.

## 2022-10-31 ENCOUNTER — Ambulatory Visit: Payer: Medicare PPO | Admitting: Internal Medicine

## 2022-10-31 NOTE — Telephone Encounter (Signed)
Pharmacy is in the chart

## 2022-11-15 ENCOUNTER — Other Ambulatory Visit: Payer: Self-pay | Admitting: Internal Medicine

## 2022-12-15 ENCOUNTER — Other Ambulatory Visit: Payer: Self-pay | Admitting: Internal Medicine

## 2022-12-18 ENCOUNTER — Other Ambulatory Visit: Payer: Self-pay | Admitting: Internal Medicine

## 2022-12-23 ENCOUNTER — Telehealth: Payer: Self-pay | Admitting: Internal Medicine

## 2022-12-23 DIAGNOSIS — E039 Hypothyroidism, unspecified: Secondary | ICD-10-CM

## 2022-12-23 NOTE — Telephone Encounter (Signed)
Patient need lab orders.

## 2022-12-23 NOTE — Assessment & Plan Note (Signed)
DOSE INCREASED TO 150 MCG.  REPEAT TSH IS DUE   Lab Results  Component Value Date   TSH 8.63 (H) 10/28/2022

## 2022-12-24 ENCOUNTER — Other Ambulatory Visit: Payer: Self-pay | Admitting: Internal Medicine

## 2022-12-27 ENCOUNTER — Encounter: Payer: Self-pay | Admitting: Podiatry

## 2022-12-27 ENCOUNTER — Ambulatory Visit: Payer: Medicare Other | Admitting: Podiatry

## 2022-12-27 DIAGNOSIS — M79676 Pain in unspecified toe(s): Secondary | ICD-10-CM | POA: Diagnosis not present

## 2022-12-27 DIAGNOSIS — M2170 Unequal limb length (acquired), unspecified site: Secondary | ICD-10-CM | POA: Diagnosis not present

## 2022-12-27 DIAGNOSIS — E1142 Type 2 diabetes mellitus with diabetic polyneuropathy: Secondary | ICD-10-CM

## 2022-12-27 DIAGNOSIS — M216X2 Other acquired deformities of left foot: Secondary | ICD-10-CM | POA: Diagnosis not present

## 2022-12-27 DIAGNOSIS — E119 Type 2 diabetes mellitus without complications: Secondary | ICD-10-CM

## 2022-12-27 DIAGNOSIS — B351 Tinea unguium: Secondary | ICD-10-CM

## 2022-12-27 NOTE — Progress Notes (Unsigned)
ANNUAL DIABETIC FOOT EXAM  Subjective: Bonnie Hinton presents today annual diabetic foot exam.  No chief complaint on file.  Patient confirms h/o diabetes.  Patient denies any h/o foot wounds.  Patient has h/o foot/leg ulcer of {jgPodToeLocator:23637}.  Patient has h/o amputation(s): {jgamp:23617}.  Patient denies any numbness, tingling, burning, or pins/needle sensation in feet.  Patient endorses symptoms of foot numbness.   Patient endorses symptoms of foot tingling.  Patient endorses symptoms of burning in feet.  Patient endorses symptoms of pins/needles sensation in feet.  Patient has been diagnosed with neuropathy.  Risk factors: {jgriskfactors:24044}.  Bonnie Shams, MD is patient's PCP.  Past Medical History:  Diagnosis Date   Anxiety    Arthritis    Choledocholithiasis    Chronic back pain    stenosis and arthritis   Community acquired pneumonia 05/29/2021   Complication of anesthesia    anxious when she wakes up   GERD (gastroesophageal reflux disease)    takes Pantoprazole daily   Glaucoma    dry angle   History of bronchitis    30+ yrs ago   History of MRSA infection 2004   several yrs ago   Hyperlipidemia    takes Zetia and Crestor daily   Hypertension    take Amlodipine daily   Hypothyroidism    takes Synthroid daily   Joint pain    Joint swelling    Neuropathy    takes Gabapentin daily   Pancreatitis, gallstone    Peripheral edema    takes Furosemide daily as needed   Pneumonia    hx of-30 + yrs ago   Pre-diabetes    takes Victoza daily   Primary localized osteoarthritis of right knee    Subdural hematoma (HCC) 8 yrs ago   hx of   Urinary frequency    Urinary urgency    Patient Active Problem List   Diagnosis Date Noted   OSA (obstructive sleep apnea) 09/02/2022   Atrial fibrillation (HCC) 07/31/2022   Mobility impaired 06/19/2022   Hordeolum externum of left upper eyelid 02/20/2022   Generalized osteoarthritis of hand  12/17/2021   Long-term use of immunosuppressant medication 12/17/2021   Lumbar degenerative disc disease 12/17/2021   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/2021   Polyarthritis 10/26/2021   Foot deformity, acquired, left 10/25/2021   Rash 10/25/2021   Osteopenia after menopause 10/25/2021   Pitting edema 10/25/2021   Mass of breast, left 08/08/2021   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   Walker as ambulation aid 07/23/2021   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   Numbness and tingling in right hand 04/25/2021   Mild protein-calorie malnutrition (HCC) 08/16/2020   OA (osteoarthritis) of finger, right 04/05/2020   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   Candidiasis of skin 06/28/2017   IBS (irritable bowel syndrome) 06/28/2017   Screening for colon cancer 12/28/2016   Dizziness 05/24/2016   Anxiety, generalized 02/20/2016   Arthritis    Acquired hypothyroidism    History of CVA (cerebrovascular accident)    Status post bilateral hip replacements    Status post right knee replacement    Degenerative lumbar spinal stenosis    Essential hypertension, benign 07/25/2015   Preoperative clearance 07/25/2015   Hyperlipidemia associated with type 2 diabetes mellitus (HCC) 07/25/2015   Mixed stress and urge urinary incontinence 04/26/2015   Drug-induced hyperkalemia 04/26/2015   Diabetic nephropathy associated with type 2 diabetes mellitus (HCC) 01/24/2015   Morbid obesity (HCC) 01/24/2015   Vitamin D deficiency 01/24/2015  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, right 09/23/2014   Presence of right artificial knee joint 09/23/2014   Past Surgical History:  Procedure Laterality Date   ABDOMINAL HYSTERECTOMY     CHOLECYSTECTOMY N/A 09/09/2016   Procedure: LAPAROSCOPIC CHOLECYSTECTOMY;  Surgeon: Manus Rudd, MD;  Location: [facility] OR;  Service: General;  Laterality: N/A;   COLONOSCOPY     RADIOLOGY WITH ANESTHESIA N/A 08/22/2015    Procedure: MRI OF LUMBAR SPINE   (RADIOLOGY WITH ANESTHESIA);  Surgeon: Medication Radiologist, MD;  Location: [facility] OR;  Service: Radiology;  Laterality: N/A;   TOTAL HIP ARTHROPLASTY Right    TOTAL HIP ARTHROPLASTY Left    TOTAL KNEE ARTHROPLASTY Left    TOTAL KNEE ARTHROPLASTY Right 01/01/2016   Procedure: TOTAL KNEE ARTHROPLASTY;  Surgeon: Salvatore Marvel, MD;  Location: [facility] OR;  Service: Orthopedics;  Laterality: Right;   Current Outpatient Medications on File Prior to Visit  Medication Sig Dispense Refill   acetaminophen (TYLENOL) 325 MG tablet Take 2 tablets (650 mg total) by mouth every 6 (six) hours as needed for mild pain (or Fever >/= 101).     amLODipine (NORVASC) 10 MG tablet TAKE ONE TABLET BY MOUTH EVERY DAY 90 tablet 1   amoxicillin (AMOXIL) 500 MG capsule TAKE 4 CAPSULES BY MOUTH 1 HOUR PRIOR TO DENTAL PROCEDURE 4 capsule 2   benzonatate (TESSALON) 200 MG capsule TAKE 1 CAPSULE (200 MG TOTAL) BY MOUTH 3 (THREE) TIMES DAILY AS NEEDED FOR COUGH. 60 capsule 0   cetirizine (ZYRTEC) 10 MG tablet Take 1 tablet (10 mg total) by mouth daily. 90 tablet 3   Cholecalciferol (VITAMIN D3) 2000 units TABS Take 1 tablet by mouth daily.     Continuous Glucose Sensor (FREESTYLE LIBRE 2 SENSOR) MISC Apply 1 each topically every 14 (fourteen) days. 6 each 3   cyanocobalamin (VITAMIN B12) 1000 MCG/ML injection Inject 1,000 mcg into the muscle every 30 (thirty) days.     cycloSPORINE (RESTASIS) 0.05 % ophthalmic emulsion Place 1 drop into both eyes 2 (two) times daily.     dicyclomine (BENTYL) 20 MG tablet TAKE 1 TABLET BY MOUTH FOUR TIMES DAILY BEFORE MEALS AND AT BEDTIME 360 tablet 2   DROPLET PEN NEEDLES 31G X 5 MM MISC USE DAILY 100 each 10   ezetimibe (ZETIA) 10 MG tablet TAKE ONE TABLET BY MOUTH EVERY DAY 90 tablet 1   folic acid (FOLVITE) 1 MG tablet Take 1 mg by mouth daily.     furosemide (LASIX) 20 MG tablet Take 1 tablet (20 mg total) by mouth daily. 90 tablet 3   hydroxychloroquine  (PLAQUENIL) 200 MG tablet Take 200 mg by mouth 2 (two) times daily.     insulin glargine (LANTUS SOLOSTAR) 100 UNIT/ML Solostar Pen Inject 25 Units into the skin daily. 30 mL 10   levothyroxine (SYNTHROID) 150 MCG tablet Take 1 tablet (150 mcg total) by mouth daily. 90 tablet 3   methotrexate (RHEUMATREX) 2.5 MG tablet Take 5 tablets by mouth once a week.     metoprolol tartrate (LOPRESSOR) 50 MG tablet TAKE 1 TABLET BY MOUTH TWICE A DAY 180 tablet 1   mupirocin ointment (BACTROBAN) 2 % APPLY TO AFFECTED AREA TWICE A DAY 22 g 11   nystatin cream (MYCOSTATIN) APPLY TO THE AFFECTED AREA(S) TWICE DAILY 90 g 2   pantoprazole (PROTONIX) 40 MG tablet TAKE ONE TABLET BY MOUTH EVERY DAY 90 tablet 1   rivaroxaban (XARELTO) 20 MG TABS tablet Take 1 tablet (20 mg total)  by mouth daily with supper. 90 tablet 1   spironolactone (ALDACTONE) 50 MG tablet Take 1 tablet (50 mg total) by mouth daily. 90 tablet 3   tiZANidine (ZANAFLEX) 4 MG tablet TAKE 1 TABLET EVERY 6 HOURS AS NEEDED FOR MUSCLE SPASM(S) 270 tablet 3   trospium (SANCTURA) 20 MG tablet Take 1 tablet (20 mg total) by mouth 2 (two) times daily. 180 tablet 2   No current facility-administered medications on file prior to visit.    Allergies  Allergen Reactions   Erythromycin Rash   Fentanyl Other (See Comments)    Duragesic-25 Duragesic-25   Iodinated Contrast Media Swelling and Rash   Atorvastatin     "stomach problems"   Metformin And Related Other (See Comments)    Diarrhea even w/ XR metformin   Victoza [Liraglutide]     Suspected to be the cause of gallstone pancreatitis June 2018   Latex Itching and Rash   Lisinopril Other (See Comments)    Hyperkalemia    Other Rash    Paper tape, possibly adhesive tape PAPER TAPE    Social History   Occupational History   Not on file  Tobacco Use   Smoking status: Never   Smokeless tobacco: Never  Vaping Use   Vaping status: Never Used  Substance and Sexual Activity   Alcohol use: No     Alcohol/week: 0.0 standard drinks of alcohol   Drug use: No   Sexual activity: Not on file   Family History  Problem Relation Age of Onset   Cancer Father    Diabetes Maternal Aunt    Cancer Brother    Breast cancer Neg Hx    Immunization History  Administered Date(s) Administered   Fluad Quad(high Dose 65+) 01/01/2019, 12/31/2019, 01/17/2021, 01/25/2022   Influenza, High Dose Seasonal PF 01/23/2015, 12/04/2015, 12/25/2016, 12/23/2017   Moderna Sars-Covid-2 Vaccination 04/07/2019, 05/05/2019, 03/02/2020   Pneumococcal Conjugate-13 04/25/2015   Pneumococcal Polysaccharide-23 02/05/2018   Tdap 01/20/2020   Zoster Recombinant(Shingrix) 05/03/2021, 12/24/2021     Review of Systems: Negative except as noted in the HPI.   Objective: There were no vitals filed for this visit.  LANISE MERGEN is a pleasant 82 y.o. female in NAD. AAO X 3.  Lab Results  Component Value Date   HGBA1C 7.4 (H) 10/28/2022   No results found. ADA Risk Categorization: Low Risk :  Patient has all of the following: Intact protective sensation No prior foot ulcer  No severe deformity Pedal pulses present  High Risk  Patient has one or more of the following: Loss of protective sensation Absent pedal pulses Severe Foot deformity History of foot ulcer  Assessment: No diagnosis found.   Plan: No orders of the defined types were placed in this encounter.   No orders of the defined types were placed in this encounter.   None  {jgplan:23602::"-Patient/POA to call should there be question/concern in the interim."} Return in about 3 months (around 03/29/2023).  Freddie Breech, DPM

## 2022-12-30 ENCOUNTER — Other Ambulatory Visit (INDEPENDENT_AMBULATORY_CARE_PROVIDER_SITE_OTHER): Payer: Medicare Other

## 2022-12-30 DIAGNOSIS — E039 Hypothyroidism, unspecified: Secondary | ICD-10-CM | POA: Diagnosis not present

## 2022-12-30 LAB — TSH: TSH: 5.6 u[IU]/mL — ABNORMAL HIGH (ref 0.35–5.50)

## 2022-12-31 ENCOUNTER — Telehealth: Payer: Self-pay

## 2022-12-31 NOTE — Telephone Encounter (Signed)
noted 

## 2022-12-31 NOTE — Telephone Encounter (Signed)
Patient states she is returning our call.  I read Dr. Helene Kelp Tullo's message to patient.

## 2022-12-31 NOTE — Assessment & Plan Note (Signed)
Your thyroid is underactive on your current levothyroxine dose; of 150 mcg daily.  Please double your dose on Sundays  ONLY,  and continue 1 tablet daily all other days.    Lab Results  Component Value Date   TSH 5.60 (H) 12/30/2022

## 2022-12-31 NOTE — Telephone Encounter (Signed)
LMTCB in regards to lab results.  

## 2022-12-31 NOTE — Telephone Encounter (Signed)
-----   Message from Sherlene Shams sent at 12/31/2022  8:16 AM EDT ----- Your thyroid is underactive on your current levothyroxine dose; of 150 mcg daily.  Please double your dose on Sundays  ONLY,  and continue 1 tablet daily all other days.

## 2023-01-01 ENCOUNTER — Encounter: Payer: Self-pay | Admitting: Internal Medicine

## 2023-01-01 ENCOUNTER — Ambulatory Visit: Payer: Medicare Other | Admitting: Internal Medicine

## 2023-01-01 ENCOUNTER — Other Ambulatory Visit: Payer: Self-pay | Admitting: Family Medicine

## 2023-01-01 VITALS — BP 140/68 | HR 80 | Ht 65.0 in | Wt 313.5 lb

## 2023-01-01 DIAGNOSIS — T45515S Adverse effect of anticoagulants, sequela: Secondary | ICD-10-CM | POA: Diagnosis not present

## 2023-01-01 DIAGNOSIS — Z993 Dependence on wheelchair: Secondary | ICD-10-CM

## 2023-01-01 DIAGNOSIS — E1169 Type 2 diabetes mellitus with other specified complication: Secondary | ICD-10-CM | POA: Diagnosis not present

## 2023-01-01 DIAGNOSIS — G4733 Obstructive sleep apnea (adult) (pediatric): Secondary | ICD-10-CM

## 2023-01-01 DIAGNOSIS — Z23 Encounter for immunization: Secondary | ICD-10-CM | POA: Diagnosis not present

## 2023-01-01 DIAGNOSIS — H00014 Hordeolum externum left upper eyelid: Secondary | ICD-10-CM

## 2023-01-01 DIAGNOSIS — G629 Polyneuropathy, unspecified: Secondary | ICD-10-CM

## 2023-01-01 DIAGNOSIS — E785 Hyperlipidemia, unspecified: Secondary | ICD-10-CM | POA: Diagnosis not present

## 2023-01-01 DIAGNOSIS — E538 Deficiency of other specified B group vitamins: Secondary | ICD-10-CM

## 2023-01-01 DIAGNOSIS — E039 Hypothyroidism, unspecified: Secondary | ICD-10-CM

## 2023-01-01 LAB — COMPREHENSIVE METABOLIC PANEL
ALT: 12 U/L (ref 0–35)
AST: 15 U/L (ref 0–37)
Albumin: 4.1 g/dL (ref 3.5–5.2)
Alkaline Phosphatase: 76 U/L (ref 39–117)
BUN: 17 mg/dL (ref 6–23)
CO2: 27 meq/L (ref 19–32)
Calcium: 9.7 mg/dL (ref 8.4–10.5)
Chloride: 101 meq/L (ref 96–112)
Creatinine, Ser: 0.88 mg/dL (ref 0.40–1.20)
GFR: 61.39 mL/min (ref >60.00)
Glucose, Bld: 169 mg/dL — ABNORMAL HIGH (ref 70–99)
Potassium: 4.2 meq/L (ref 3.5–5.1)
Sodium: 137 meq/L (ref 135–145)
Total Bilirubin: 0.5 mg/dL (ref 0.2–1.2)
Total Protein: 7.3 g/dL (ref 6.0–8.3)

## 2023-01-01 LAB — CBC WITH DIFFERENTIAL/PLATELET
Basophils Absolute: 0.1 10*3/uL (ref 0.0–0.1)
Basophils Relative: 1 % (ref 0.0–3.0)
Eosinophils Absolute: 0.2 10*3/uL (ref 0.0–0.7)
Eosinophils Relative: 2.5 % (ref 0.0–5.0)
HCT: 43.2 % (ref 36.0–46.0)
Hemoglobin: 14.1 g/dL (ref 12.0–15.0)
Lymphocytes Relative: 19.2 % (ref 12.0–46.0)
Lymphs Abs: 1.5 10*3/uL (ref 0.7–4.0)
MCHC: 32.7 g/dL (ref 30.0–36.0)
MCV: 95.6 fL (ref 78.0–100.0)
Monocytes Absolute: 0.3 10*3/uL (ref 0.1–1.0)
Monocytes Relative: 4.1 % (ref 3.0–12.0)
Neutro Abs: 5.6 10*3/uL (ref 1.4–7.7)
Neutrophils Relative %: 73.2 % (ref 43.0–77.0)
Platelets: 347 10*3/uL (ref 150.0–400.0)
RBC: 4.51 Mil/uL (ref 3.87–5.11)
RDW: 14 % (ref 11.5–15.5)
WBC: 7.7 10*3/uL (ref 4.0–10.5)

## 2023-01-01 LAB — B12 AND FOLATE PANEL
Folate: >24.2 ng/mL (ref >5.9)
Vitamin B-12: >1501 pg/mL — ABNORMAL HIGH (ref 211–911)

## 2023-01-01 NOTE — Assessment & Plan Note (Signed)
She is Statin intolerant.  Tolerating Zetia.  she has no known history of heart disease .  Lab Results  Component Value Date   CHOL 227 (H) 10/28/2022   HDL 48.30 10/28/2022   LDLCALC 142 (H) 10/28/2022   LDLDIRECT 128.0 07/31/2022   TRIG 187.0 (H) 10/28/2022   CHOLHDL 5 10/28/2022

## 2023-01-01 NOTE — Assessment & Plan Note (Signed)
Your thyroid is underactive on your current levothyroxine dose; of 150 mcg daily.  Please double your dose on Sundays  ONLY,  and continue 1 tablet daily all other days.    Lab Results  Component Value Date   TSH 5.60 (H) 12/30/2022

## 2023-01-01 NOTE — Progress Notes (Addendum)
Subjective:  Patient ID: Bonnie Hinton, female    DOB: Apr 18, 1940  Age: 82 y.o. MRN: [id number]  CC: The primary encounter diagnosis was Hyperlipidemia associated with type 2 diabetes mellitus (HCC). Diagnoses of Anticoagulant adverse reaction, sequela, Neuropathy, B12 deficiency, Acquired hypothyroidism, OSA (obstructive sleep apnea), Need for influenza vaccination, and Wheelchair dependent were also pertinent to this visit.   HPI Bonnie Hinton presents for  Chief Complaint  Patient presents with   Medical Management of Chronic Issues    3 month follow up    .1) itching :  states it is from taking folic acid prescribed by Dr Sherryll Burger 's PA at her last visit.  for unclear reasons.  Has stopped taking it and itching has resolved  was also attributing itching to the acid in catsup  2)  severe b12 deficiency :  she has been taking 2 chewables daily  after several weekly injections . No folate level   3) type 2 DM with obesity .  Reviewed family history :  both parents had CANCER but no thyroid cancer . She has a history of gallstone pancreatitis   4)  OSA:  mild by recent sleep study .  She is apprehensive to use CPAP but using it.( bc brother had tongue CA and OSA /on CPAP ) has been using it differently due to chronic  back pain with supine position,  sleeps in a lift chair  and uses CPAP only when in the lift chair.  Averages 5 to 6 hours of use.  5) HTN:  has stopped checking her BP  because she felt overwhelmed by all of her medical needs.   6) Mobility;  patient has been unable to ambulate since her knee replacement surgery several years ago.  She requires use of manual wheelchair to provide mobility due to chronic leg pain and leg weakness due to OA of hips and knees and lumbar spinal stenosis .  She can transfer from wheelchair to bed and to commode but otherwise relies on the use of a manual wheelchair to performs her ADLS and provide safe mobility.    Outpatient Medications  Prior to Visit  Medication Sig Dispense Refill   acetaminophen (TYLENOL) 325 MG tablet Take 2 tablets (650 mg total) by mouth every 6 (six) hours as needed for mild pain (or Fever >/= 101).     amLODipine (NORVASC) 10 MG tablet TAKE ONE TABLET BY MOUTH EVERY DAY 90 tablet 1   amoxicillin (AMOXIL) 500 MG capsule TAKE 4 CAPSULES BY MOUTH 1 HOUR PRIOR TO DENTAL PROCEDURE 4 capsule 2   cetirizine (ZYRTEC) 10 MG tablet Take 1 tablet (10 mg total) by mouth daily. 90 tablet 3   Cholecalciferol (VITAMIN D3) 2000 units TABS Take 1 tablet by mouth daily.     cycloSPORINE (RESTASIS) 0.05 % ophthalmic emulsion Place 1 drop into both eyes 2 (two) times daily.     dicyclomine (BENTYL) 20 MG tablet TAKE 1 TABLET BY MOUTH FOUR TIMES DAILY BEFORE MEALS AND AT BEDTIME 360 tablet 2   DROPLET PEN NEEDLES 31G X 5 MM MISC USE DAILY 100 each 10   ezetimibe (ZETIA) 10 MG tablet TAKE ONE TABLET BY MOUTH EVERY DAY 90 tablet 1   folic acid (FOLVITE) 1 MG tablet Take 1 mg by mouth daily.     furosemide (LASIX) 20 MG tablet Take 1 tablet (20 mg total) by mouth daily. 90 tablet 3   hydroxychloroquine (PLAQUENIL) 200 MG tablet Take 200 mg by  mouth 2 (two) times daily.     insulin glargine (LANTUS SOLOSTAR) 100 UNIT/ML Solostar Pen Inject 25 Units into the skin daily. 30 mL 10   levothyroxine (SYNTHROID) 150 MCG tablet Take 1 tablet (150 mcg total) by mouth daily. 90 tablet 3   methotrexate (RHEUMATREX) 2.5 MG tablet Take 5 tablets by mouth once a week.     mupirocin ointment (BACTROBAN) 2 % APPLY TO AFFECTED AREA TWICE A DAY 22 g 11   nystatin cream (MYCOSTATIN) APPLY TO THE AFFECTED AREA(S) TWICE DAILY 90 g 2   pantoprazole (PROTONIX) 40 MG tablet TAKE ONE TABLET BY MOUTH EVERY DAY 90 tablet 1   spironolactone (ALDACTONE) 50 MG tablet Take 1 tablet (50 mg total) by mouth daily. 90 tablet 3   trospium (SANCTURA) 20 MG tablet Take 1 tablet (20 mg total) by mouth 2 (two) times daily. 180 tablet 2   benzonatate (TESSALON) 200  MG capsule TAKE 1 CAPSULE (200 MG TOTAL) BY MOUTH 3 (THREE) TIMES DAILY AS NEEDED FOR COUGH. 60 capsule 0   cyanocobalamin (VITAMIN B12) 1000 MCG/ML injection Inject 1,000 mcg into the muscle every 30 (thirty) days.     metoprolol tartrate (LOPRESSOR) 50 MG tablet TAKE 1 TABLET BY MOUTH TWICE A DAY 180 tablet 1   rivaroxaban (XARELTO) 20 MG TABS tablet Take 1 tablet (20 mg total) by mouth daily with supper. 90 tablet 1   tiZANidine (ZANAFLEX) 4 MG tablet TAKE 1 TABLET EVERY 6 HOURS AS NEEDED FOR MUSCLE SPASM(S) 270 tablet 3   Continuous Glucose Sensor (FREESTYLE LIBRE 2 SENSOR) MISC Apply 1 each topically every 14 (fourteen) days. (Patient not taking: Reported on 01/01/2023) 6 each 3   No facility-administered medications prior to visit.    Review of Systems;  Patient denies headache, fevers, malaise, unintentional weight loss, skin rash, eye pain, sinus congestion and sinus pain, sore throat, dysphagia,  hemoptysis , cough, dyspnea, wheezing, chest pain, palpitations, orthopnea, edema, abdominal pain, nausea, melena, diarrhea, constipation, flank pain, dysuria, hematuria, urinary  Frequency, nocturia, numbness, tingling, seizures,  Focal weakness, Loss of consciousness,  Tremor, insomnia, depression, anxiety, and suicidal ideation.      Objective:  BP (!) 140/68   Pulse 80   Ht 5\' 5"  (1.651 m)   Wt (!) 313 lb 8 oz (142.2 kg)   SpO2 93%   BMI 52.17 kg/m   BP Readings from Last 3 Encounters:  01/01/23 (!) 140/68  10/10/22 116/68  09/19/22 132/81    Wt Readings from Last 3 Encounters:  01/01/23 (!) 313 lb 8 oz (142.2 kg)  10/10/22 (!) 306 lb (138.8 kg)  09/02/22 300 lb (136.1 kg)    Physical Exam Vitals reviewed.  Constitutional:      General: She is not in acute distress.    Appearance: Normal appearance. She is obese. She is toxic-appearing. She is not ill-appearing or diaphoretic.     Interventions: Face mask in place.     Comments: Using a manual wheelchair  HENT:      Head: Normocephalic.  Eyes:     General: No scleral icterus.       Right eye: No discharge.        Left eye: No discharge.     Conjunctiva/sclera: Conjunctivae normal.  Cardiovascular:     Rate and Rhythm: Normal rate and regular rhythm.     Heart sounds: Normal heart sounds.  Pulmonary:     Effort: Pulmonary effort is normal. No respiratory distress.  Breath sounds: Normal breath sounds.  Musculoskeletal:        General: Normal range of motion.  Skin:    General: Skin is warm and dry.  Neurological:     General: No focal deficit present.     Mental Status: She is alert and oriented to person, place, and time. Mental status is at baseline.  Psychiatric:        Mood and Affect: Mood normal.        Behavior: Behavior normal.        Thought Content: Thought content normal.        Judgment: Judgment normal.    Lab Results  Component Value Date   HGBA1C 7.4 (H) 10/28/2022   HGBA1C 6.9 (A) 07/31/2022   HGBA1C 7.3 (H) 04/25/2022    Lab Results  Component Value Date   CREATININE 0.88 01/01/2023   CREATININE 0.95 10/28/2022   CREATININE 0.78 07/31/2022    Lab Results  Component Value Date   WBC 7.7 01/01/2023   HGB 14.1 01/01/2023   HCT 43.2 01/01/2023   PLT 347.0 01/01/2023   GLUCOSE 169 (H) 01/01/2023   CHOL 227 (H) 10/28/2022   TRIG 187.0 (H) 10/28/2022   HDL 48.30 10/28/2022   LDLDIRECT 128.0 07/31/2022   LDLCALC 142 (H) 10/28/2022   ALT 12 01/01/2023   AST 15 01/01/2023   NA 137 01/01/2023   K 4.2 01/01/2023   CL 101 01/01/2023   CREATININE 0.88 01/01/2023   BUN 17 01/01/2023   CO2 27 01/01/2023   TSH 5.60 (H) 12/30/2022   INR 1.37 09/04/2016   HGBA1C 7.4 (H) 10/28/2022   MICROALBUR 1.9 07/31/2022    ECHOCARDIOGRAM COMPLETE  Result Date: 10/07/2022    ECHOCARDIOGRAM REPORT   Patient Name:   MAURISSA SICHER Date of Exam: 10/07/2022 Medical Rec #:  [id number]           Height:       65.0 in Accession #:    [id number]          Weight:       300.0 lb  Date of Birth:  [birth year]          BSA:          2.350 m Patient Age:    81 years            BP:           132/81 mmHg Patient Gender: F                   HR:           68 bpm. Exam Location:  [facility] Procedure: 2D Echo, Cardiac Doppler and Color Doppler Indications:     Atrial Fibrillation  History:         Patient has prior history of Echocardiogram examinations, most                  recent 07/14/2015. Stroke, Arrythmias:Atrial Fibrillation,                  Signs/Symptoms:Dizziness/Lightheadedness and Edema; Risk                  Factors:Hypertension, Diabetes, Dyslipidemia and Sleep Apnea.  Sonographer:     Mikki Harbor Referring Phys:  [id number] Sherlene Shams Diagnosing Phys: Lorine Bears MD  Sonographer Comments: Technically difficult study due to poor echo windows and patient is obese. IMPRESSIONS  1. Left ventricular ejection fraction, by estimation, is 55  to 60%. The left ventricle has normal function. The left ventricle has no regional wall motion abnormalities. Left ventricular diastolic parameters are consistent with Grade I diastolic dysfunction (impaired relaxation).  2. Right ventricular systolic function is normal. The right ventricular size is normal. There is normal pulmonary artery systolic pressure.  3. Left atrial size was mildly dilated.  4. The mitral valve is normal in structure. No evidence of mitral valve regurgitation. No evidence of mitral stenosis. Moderate mitral annular calcification.  5. The aortic valve is calcified. Aortic valve regurgitation is not visualized. Aortic valve sclerosis/calcification is present, without any evidence of aortic stenosis.  6. The inferior vena cava is normal in size with greater than 50% respiratory variability, suggesting right atrial pressure of 3 mmHg. FINDINGS  Left Ventricle: Left ventricular ejection fraction, by estimation, is 55 to 60%. The left ventricle has normal function. The left ventricle has no regional wall motion abnormalities. The left  ventricular internal cavity size was normal in size. There is  no left ventricular hypertrophy. Left ventricular diastolic parameters are consistent with Grade I diastolic dysfunction (impaired relaxation). Right Ventricle: The right ventricular size is normal. No increase in right ventricular wall thickness. Right ventricular systolic function is normal. There is normal pulmonary artery systolic pressure. The tricuspid regurgitant velocity is 2.07 m/s, and  with an assumed right atrial pressure of 3 mmHg, the estimated right ventricular systolic pressure is 20.1 mmHg. Left Atrium: Left atrial size was mildly dilated. Right Atrium: Right atrial size was normal in size. Pericardium: There is no evidence of pericardial effusion. Mitral Valve: The mitral valve is normal in structure. Moderate mitral annular calcification. No evidence of mitral valve regurgitation. No evidence of mitral valve stenosis. MV peak gradient, 3.8 mmHg. The mean mitral valve gradient is 2.0 mmHg. Tricuspid Valve: The tricuspid valve is normal in structure. Tricuspid valve regurgitation is trivial. No evidence of tricuspid stenosis. Aortic Valve: The aortic valve is calcified. Aortic valve regurgitation is not visualized. Aortic valve sclerosis/calcification is present, without any evidence of aortic stenosis. Aortic valve mean gradient measures 4.0 mmHg. Aortic valve peak gradient measures 6.8 mmHg. Aortic valve area, by VTI measures 2.49 cm. Pulmonic Valve: The pulmonic valve was normal in structure. Pulmonic valve regurgitation is not visualized. No evidence of pulmonic stenosis. Aorta: The aortic root is normal in size and structure. Venous: The inferior vena cava is normal in size with greater than 50% respiratory variability, suggesting right atrial pressure of 3 mmHg. IAS/Shunts: No atrial level shunt detected by color flow Doppler.  LEFT VENTRICLE PLAX 2D LVIDd:         5.00 cm   Diastology LVIDs:         3.20 cm   LV e' medial:     9.14 cm/s LV PW:         1.00 cm   LV E/e' medial:  8.2 LV IVS:        1.10 cm   LV e' lateral:   8.16 cm/s LVOT diam:     2.00 cm   LV E/e' lateral: 9.2 LV SV:         79 LV SV Index:   34 LVOT Area:     3.14 cm  RIGHT VENTRICLE RV Basal diam:  3.80 cm RV Mid diam:    3.20 cm RV S prime:     12.70 cm/s TAPSE (M-mode): 2.4 cm LEFT ATRIUM             Index  RIGHT ATRIUM           Index LA diam:        4.00 cm 1.70 cm/m   RA Area:     15.50 cm LA Vol (A2C):   55.9 ml 23.79 ml/m  RA Volume:   41.60 ml  17.70 ml/m LA Vol (A4C):   65.6 ml 27.91 ml/m LA Biplane Vol: 60.6 ml 25.79 ml/m  AORTIC VALVE                    PULMONIC VALVE AV Area (Vmax):    2.51 cm     PV Vmax:       0.72 m/s AV Area (Vmean):   2.53 cm     PV Peak grad:  2.1 mmHg AV Area (VTI):     2.49 cm AV Vmax:           130.00 cm/s AV Vmean:          85.800 cm/s AV VTI:            0.317 m AV Peak Grad:      6.8 mmHg AV Mean Grad:      4.0 mmHg LVOT Vmax:         104.00 cm/s LVOT Vmean:        69.000 cm/s LVOT VTI:          0.251 m LVOT/AV VTI ratio: 0.79  AORTA Ao Root diam: 3.10 cm MITRAL VALVE               TRICUSPID VALVE MV Area (PHT): 2.71 cm    TR Peak grad:   17.1 mmHg MV Area VTI:   2.74 cm    TR Vmax:        207.00 cm/s MV Peak grad:  3.8 mmHg MV Mean grad:  2.0 mmHg    SHUNTS MV Vmax:       0.98 m/s    Systemic VTI:  0.25 m MV Vmean:      59.1 cm/s   Systemic Diam: 2.00 cm MV Decel Time: 280 msec MV E velocity: 74.90 cm/s MV A velocity: 98.50 cm/s MV E/A ratio:  0.76 Lorine Bears MD Electronically signed by Lorine Bears MD Signature Date/Time: 10/07/2022/3:33:42 PM    Final     Assessment & Plan:  .Hyperlipidemia associated with type 2 diabetes mellitus (HCC) Assessment & Plan: She is Statin intolerant.  Tolerating Zetia.  she has no known history of heart disease .  Lab Results  Component Value Date   CHOL 227 (H) 10/28/2022   HDL 48.30 10/28/2022   LDLCALC 142 (H) 10/28/2022   LDLDIRECT 128.0 07/31/2022   TRIG  187.0 (H) 10/28/2022   CHOLHDL 5 10/28/2022     Orders: -     Comprehensive metabolic panel  Anticoagulant adverse reaction, sequela -     CBC with Differential/Platelet  Neuropathy  B12 deficiency -     B12 and Folate Panel -     Intrinsic Factor Antibodies  Acquired hypothyroidism Assessment & Plan: Your thyroid is underactive on your current levothyroxine dose; of 150 mcg daily.  Please double your dose on Sundays  ONLY,  and continue 1 tablet daily all other days.    Lab Results  Component Value Date   TSH 5.60 (H) 12/30/2022      OSA (obstructive sleep apnea)  Need for influenza vaccination -     Flu Vaccine Trivalent High Dose (Fluad)  Wheelchair dependent Assessment & Plan:  Patient 's mobility has deteriorated..  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  She cannot ambulate around her home safely on a walker or with a cane due to concurrent arthritis of both shoulders resulting in bilateral arm weakness  .  She cannot prepare meals from a standing position due to bilateral leg weakness  .  She is currently able to navigate her home independently in a manual wheelchair,  b   Other orders -     Cyanocobalamin; Inject 1 mL (1,000 mcg total) into the muscle every 30 (thirty) days.  Dispense: 3 mL; Refill: 4     I provided 30 minutes of face-to-face time during this encounter reviewing patient's last visit with me, patient's  most recent visit with cardiology,  nephrology,  and neurology,  recent surgical and non surgical procedures, previous  labs and imaging studies, counseling on currently addressed issues,  and post visit ordering to diagnostics and therapeutics .   Follow-up: No follow-ups on file.   Sherlene Shams, MD

## 2023-01-01 NOTE — Patient Instructions (Addendum)
You are doing fine with the CPAP!  Do not worry if you have a bad night!  Your heart is fine.    Your biggest problem is your inability to lose weight  The medications we discussed are contraindicated because you had gallstone pancreatitis when you were taking Victoza and the medications are in the same class  You will have to lose weight the old fashioned way: reducing your portion size and being more active   I will see you in 3 months

## 2023-01-04 LAB — INTRINSIC FACTOR ANTIBODIES: Intrinsic Factor: POSITIVE — AB

## 2023-01-06 MED ORDER — CYANOCOBALAMIN 1000 MCG/ML IJ SOLN: 1000.0000 ug | INTRAMUSCULAR | 4 refills | Status: DC

## 2023-01-06 NOTE — Addendum Note (Signed)
Addended by: Sherlene Shams on: 01/06/2023 07:19 PM   Modules accepted: Orders

## 2023-01-15 ENCOUNTER — Other Ambulatory Visit: Payer: Self-pay | Admitting: Internal Medicine

## 2023-01-27 ENCOUNTER — Other Ambulatory Visit: Payer: Self-pay | Admitting: Internal Medicine

## 2023-01-29 ENCOUNTER — Telehealth: Payer: Self-pay | Admitting: Internal Medicine

## 2023-01-29 MED ORDER — RIVAROXABAN 20 MG PO TABS: ORAL_TABLET | ORAL | 1 refills | Status: DC

## 2023-01-29 NOTE — Telephone Encounter (Signed)
Spoke with pt to let her know that we have sent the refill to [facility] [address] and that they do have it in stock.

## 2023-01-29 NOTE — Telephone Encounter (Signed)
Pt called stating the Carlena Hurl is on back order from the company she gets it from so she want to see if [facility] [address] has it if so she would like a refill

## 2023-02-18 ENCOUNTER — Other Ambulatory Visit: Payer: Self-pay | Admitting: Internal Medicine

## 2023-03-04 ENCOUNTER — Telehealth: Payer: Self-pay

## 2023-03-04 NOTE — Telephone Encounter (Signed)
Received a fax from [facility] supply stating that they are needing the face to face note from 01/01/2023 discussing the continuous usage of her wheelchair. Also the forms to go with the fac to face note have been placed in the quick sign folder.

## 2023-03-04 NOTE — Assessment & Plan Note (Signed)
Patient 's mobility has deteriorated..  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  She cannot ambulate around her home safely on a walker or with a cane due to concurrent arthritis of both shoulders resulting in bilateral arm weakness  .  She cannot prepare meals from a standing position due to bilateral leg weakness  .  She is currently able to navigate her home independently in a manual wheelchair,  b

## 2023-03-06 NOTE — Telephone Encounter (Signed)
Faxed

## 2023-03-13 ENCOUNTER — Other Ambulatory Visit: Payer: Self-pay | Admitting: Internal Medicine

## 2023-03-26 HISTORY — PX: OTHER SURGICAL HISTORY: SHX169

## 2023-03-27 ENCOUNTER — Telehealth: Payer: Self-pay

## 2023-03-27 NOTE — Telephone Encounter (Signed)
 Office notes have been faxed.

## 2023-03-27 NOTE — Telephone Encounter (Signed)
 Copied from CRM [id number]. Topic: General - Other >> Mar 27, 2023 10:50 AM [facility] ORN wrote: Reason for CRM: Jenkins with  [employer]- requesting callback from  Dr. Verneita Kettering- foir chart notes timeframe 08/24/22 and onwards  Callback # [phone]

## 2023-04-03 ENCOUNTER — Ambulatory Visit: Payer: Medicare Other | Admitting: Podiatry

## 2023-04-04 ENCOUNTER — Ambulatory Visit: Payer: Medicare Other | Admitting: Internal Medicine

## 2023-04-16 ENCOUNTER — Other Ambulatory Visit: Payer: Self-pay | Admitting: Internal Medicine

## 2023-04-24 ENCOUNTER — Ambulatory Visit: Payer: Medicare Other | Admitting: Internal Medicine

## 2023-04-24 ENCOUNTER — Encounter: Payer: Self-pay | Admitting: Internal Medicine

## 2023-04-24 VITALS — BP 132/68 | HR 80 | Ht 65.0 in | Wt 313.5 lb

## 2023-04-24 DIAGNOSIS — E039 Hypothyroidism, unspecified: Secondary | ICD-10-CM | POA: Diagnosis not present

## 2023-04-24 DIAGNOSIS — R200 Anesthesia of skin: Secondary | ICD-10-CM

## 2023-04-24 DIAGNOSIS — E1169 Type 2 diabetes mellitus with other specified complication: Secondary | ICD-10-CM

## 2023-04-24 DIAGNOSIS — R197 Diarrhea, unspecified: Secondary | ICD-10-CM

## 2023-04-24 DIAGNOSIS — Z79899 Other long term (current) drug therapy: Secondary | ICD-10-CM | POA: Diagnosis not present

## 2023-04-24 DIAGNOSIS — M0579 Rheumatoid arthritis with rheumatoid factor of multiple sites without organ or systems involvement: Secondary | ICD-10-CM

## 2023-04-24 DIAGNOSIS — R202 Paresthesia of skin: Secondary | ICD-10-CM

## 2023-04-24 DIAGNOSIS — E785 Hyperlipidemia, unspecified: Secondary | ICD-10-CM | POA: Diagnosis not present

## 2023-04-24 DIAGNOSIS — I10 Essential (primary) hypertension: Secondary | ICD-10-CM | POA: Diagnosis not present

## 2023-04-24 DIAGNOSIS — M130 Polyarthritis, unspecified: Secondary | ICD-10-CM

## 2023-04-24 DIAGNOSIS — Z794 Long term (current) use of insulin: Secondary | ICD-10-CM

## 2023-04-24 DIAGNOSIS — Z7960 Long term (current) use of unspecified immunomodulators and immunosuppressants: Secondary | ICD-10-CM

## 2023-04-24 LAB — COMPREHENSIVE METABOLIC PANEL
ALT: 10 U/L (ref 0–35)
AST: 17 U/L (ref 0–37)
Albumin: 4.2 g/dL (ref 3.5–5.2)
Alkaline Phosphatase: 72 U/L (ref 39–117)
BUN: 15 mg/dL (ref 6–23)
CO2: 28 meq/L (ref 19–32)
Calcium: 9.6 mg/dL (ref 8.4–10.5)
Chloride: 99 meq/L (ref 96–112)
Creatinine, Ser: 0.96 mg/dL (ref 0.40–1.20)
GFR: 55.19 mL/min — ABNORMAL LOW (ref >60.00)
Glucose, Bld: 149 mg/dL — ABNORMAL HIGH (ref 70–99)
Potassium: 4 meq/L (ref 3.5–5.1)
Sodium: 138 meq/L (ref 135–145)
Total Bilirubin: 0.4 mg/dL (ref 0.2–1.2)
Total Protein: 7.8 g/dL (ref 6.0–8.3)

## 2023-04-24 LAB — POCT GLYCOSYLATED HEMOGLOBIN (HGB A1C): Hemoglobin A1C: 8.2 % — AB (ref 4.0–5.6)

## 2023-04-24 LAB — CBC WITH DIFFERENTIAL/PLATELET
Basophils Absolute: 0 10*3/uL (ref 0.0–0.1)
Basophils Relative: 0.6 % (ref 0.0–3.0)
Eosinophils Absolute: 0.5 10*3/uL (ref 0.0–0.7)
Eosinophils Relative: 6.1 % — ABNORMAL HIGH (ref 0.0–5.0)
HCT: 42.8 % (ref 36.0–46.0)
Hemoglobin: 14 g/dL (ref 12.0–15.0)
Lymphocytes Relative: 13.8 % (ref 12.0–46.0)
Lymphs Abs: 1.2 10*3/uL (ref 0.7–4.0)
MCHC: 32.6 g/dL (ref 30.0–36.0)
MCV: 92.4 fL (ref 78.0–100.0)
Monocytes Absolute: 0.5 10*3/uL (ref 0.1–1.0)
Monocytes Relative: 5.5 % (ref 3.0–12.0)
Neutro Abs: 6.3 10*3/uL (ref 1.4–7.7)
Neutrophils Relative %: 74 % (ref 43.0–77.0)
Platelets: 326 10*3/uL (ref 150.0–400.0)
RBC: 4.64 Mil/uL (ref 3.87–5.11)
RDW: 13.8 % (ref 11.5–15.5)
WBC: 8.4 10*3/uL (ref 4.0–10.5)

## 2023-04-24 LAB — LIPID PANEL
Cholesterol: 197 mg/dL (ref 0–200)
HDL: 44.9 mg/dL (ref >39.00)
LDL Cholesterol: 124 mg/dL — ABNORMAL HIGH (ref 0–99)
NonHDL: 152.07
Total CHOL/HDL Ratio: 4
Triglycerides: 140 mg/dL (ref 0.0–149.0)
VLDL: 28 mg/dL (ref 0.0–40.0)

## 2023-04-24 LAB — LDL CHOLESTEROL, DIRECT: Direct LDL: 139 mg/dL

## 2023-04-24 LAB — TSH: TSH: 3.24 u[IU]/mL (ref 0.35–5.50)

## 2023-04-24 LAB — C-REACTIVE PROTEIN: CRP: 1.4 mg/dL (ref 0.5–20.0)

## 2023-04-24 LAB — SEDIMENTATION RATE: Sed Rate: 82 mm/h — ABNORMAL HIGH (ref 0–30)

## 2023-04-24 LAB — HEMOGLOBIN A1C: Hgb A1c MFr Bld: 8.6 % — ABNORMAL HIGH (ref 4.6–6.5)

## 2023-04-24 MED ORDER — AMLODIPINE BESYLATE 10 MG PO TABS: 10.0000 mg | ORAL_TABLET | Freq: Every day | ORAL | 1 refills | Status: DC

## 2023-04-24 MED ORDER — PANTOPRAZOLE SODIUM 40 MG PO TBEC: 40.0000 mg | DELAYED_RELEASE_TABLET | Freq: Every day | ORAL | 1 refills | Status: DC

## 2023-04-24 MED ORDER — EZETIMIBE 10 MG PO TABS: 10.0000 mg | ORAL_TABLET | Freq: Every day | ORAL | 1 refills | Status: DC

## 2023-04-24 NOTE — Assessment & Plan Note (Signed)
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 She is deferring referral to hand surgeon

## 2023-04-24 NOTE — Progress Notes (Addendum)
Subjective:  Patient ID: Bonnie Hinton, female    DOB: 1940-10-14  Age: 83 y.o. MRN: [id number]  CC: The primary encounter diagnosis was Essential hypertension, benign. Diagnoses of Hyperlipidemia associated with type 2 diabetes mellitus (HCC), Polyarthritis, Rheumatoid arthritis involving multiple sites with positive rheumatoid factor (HCC), Acquired hypothyroidism, Long-term use of high-risk medication, Numbness and tingling in right hand, Long-term use of immunosuppressant medication, and Diarrhea in adult patient were also pertinent to this visit.   HPI Bonnie Hinton presents for  Chief Complaint  Patient presents with   Medical Management of Chronic Issues    3 month follow up    1) bilateral hand pain .  Frustrated due to continuous pain,  limited use of hands .  Multifactorial Has CTS , b12 neuropathy,  and RA  but has not responded to injections by Sherryll Burger . She has been prescribed methotrexate and placquenil and has stopped taking methotrexate prescribed by Dr Allena Katz at [facility].   Has not had an appt in over a year n Hands numb   index finger on right painful to flex .  Has not had the compounded cream pres crobed by neurology filled  due to cost.  Using otc voltaren gel .   Using wheelchair,  looking to get an upright walker . Using the splints at night   2) type 2 DM Morbid obesity:  HAS BEEN skipping breakfast "to lose weight"    wearing CG monitor ,  did not bring her sensor,  notes that this morning her BS jumped from 80 to 140 without any meal     3) bilateral DJD hips and knees:  remains wheelchair/walker dependent :  getting PT once a week and OT once week  ,  doing the exercises three times daily.    4) IBS?  diarrhea:    loose stools occurring almost every morning  before eating.  Stools are not watery but loose.  usiing bentyl at max amount without any appreciated effect.   5) Type 2 DM:     Outpatient Medications Prior to Visit  Medication Sig Dispense  Refill   acetaminophen (TYLENOL) 325 MG tablet Take 2 tablets (650 mg total) by mouth every 6 (six) hours as needed for mild pain (or Fever >/= 101).     amoxicillin (AMOXIL) 500 MG capsule TAKE 4 CAPSULES BY MOUTH 1 HOUR PRIOR TO DENTAL PROCEDURE 4 capsule 2   benzonatate (TESSALON) 200 MG capsule TAKE 1 CAPSULE (200 MG TOTAL) BY MOUTH 3 (THREE) TIMES DAILY AS NEEDED FOR COUGH. 60 capsule 0   cetirizine (ZYRTEC) 10 MG tablet Take 1 tablet (10 mg total) by mouth daily. 90 tablet 3   Cholecalciferol (VITAMIN D3) 2000 units TABS Take 1 tablet by mouth daily.     cyanocobalamin (VITAMIN B12) 1000 MCG/ML injection Inject 1 mL (1,000 mcg total) into the muscle every 30 (thirty) days. 3 mL 4   cycloSPORINE (RESTASIS) 0.05 % ophthalmic emulsion Place 1 drop into both eyes 2 (two) times daily.     dicyclomine (BENTYL) 20 MG tablet TAKE 1 TABLET BY MOUTH FOUR TIMES DAILY BEFORE MEALS AND AT BEDTIME 360 tablet 2   DROPLET PEN NEEDLES 31G X 5 MM MISC USE DAILY 100 each 10   folic acid (FOLVITE) 1 MG tablet Take 1 mg by mouth daily.     furosemide (LASIX) 20 MG tablet Take 1 tablet (20 mg total) by mouth daily. 90 tablet 3   hydroxychloroquine (PLAQUENIL) 200  MG tablet Take 200 mg by mouth 2 (two) times daily.     insulin glargine (LANTUS SOLOSTAR) 100 UNIT/ML Solostar Pen Inject 25 Units into the skin daily. 30 mL 10   levothyroxine (SYNTHROID) 150 MCG tablet Take 1 tablet (150 mcg total) by mouth daily. 90 tablet 3   methotrexate (RHEUMATREX) 2.5 MG tablet Take 5 tablets by mouth once a week.     metoprolol tartrate (LOPRESSOR) 50 MG tablet TAKE ONE TABLET BY MOUTH TWICE A DAY 180 tablet 1   mupirocin ointment (BACTROBAN) 2 % APPLY TO AFFECTED AREA TWICE A DAY 22 g 11   nystatin cream (MYCOSTATIN) APPLY TO THE AFFECTED AREA(S) TWICE DAILY 90 g 2   rivaroxaban (XARELTO) 20 MG TABS tablet TAKE ONE TABLET BY MOUTH EVERY DAY WITH SUPPER 90 tablet 1   spironolactone (ALDACTONE) 50 MG tablet Take 1 tablet (50 mg  total) by mouth daily. 90 tablet 3   tiZANidine (ZANAFLEX) 4 MG tablet TAKE 1 TABLET BY MOUTH EVERY 6 HOURS AS NEEDED FOR MUSCLE SPASMS 270 tablet 3   trospium (SANCTURA) 20 MG tablet TAKE ONE TABLET BY MOUTH TWICE A DAY 180 tablet 2   amLODipine (NORVASC) 10 MG tablet TAKE ONE TABLET BY MOUTH EVERY DAY 90 tablet 1   ezetimibe (ZETIA) 10 MG tablet TAKE ONE TABLET BY MOUTH EVERY DAY 90 tablet 1   pantoprazole (PROTONIX) 40 MG tablet TAKE ONE TABLET BY MOUTH EVERY DAY 90 tablet 1   No facility-administered medications prior to visit.    Review of Systems;  Patient denies headache, fevers, malaise, unintentional weight loss, skin rash, eye pain, sinus congestion and sinus pain, sore throat, dysphagia,  hemoptysis , cough, dyspnea, wheezing, chest pain, palpitations, orthopnea, edema, abdominal pain, nausea, melena, diarrhea, constipation, flank pain, dysuria, hematuria, urinary  Frequency, nocturia, numbness, tingling, seizures,  Focal weakness, Loss of consciousness,  Tremor, insomnia, depression, anxiety, and suicidal ideation.      Objective:  BP 132/68   Pulse 80   Ht 5\' 5"  (1.651 m)   Wt (!) 313 lb 8 oz (142.2 kg)   SpO2 98%   BMI 52.17 kg/m   BP Readings from Last 3 Encounters:  04/24/23 132/68  01/01/23 (!) 140/68  10/10/22 116/68    Wt Readings from Last 3 Encounters:  04/24/23 (!) 313 lb 8 oz (142.2 kg)  01/01/23 (!) 313 lb 8 oz (142.2 kg)  10/10/22 (!) 306 lb (138.8 kg)    Physical Exam Vitals reviewed.  Constitutional:      General: She is not in acute distress.    Appearance: Normal appearance. She is obese. She is not ill-appearing, toxic-appearing or diaphoretic.  HENT:     Head: Normocephalic.  Eyes:     General: No scleral icterus.       Right eye: No discharge.        Left eye: No discharge.     Conjunctiva/sclera: Conjunctivae normal.  Cardiovascular:     Rate and Rhythm: Normal rate and regular rhythm.     Heart sounds: Normal heart sounds.   Pulmonary:     Effort: Pulmonary effort is normal. No respiratory distress.     Breath sounds: Normal breath sounds.  Musculoskeletal:        General: Normal range of motion.  Skin:    General: Skin is warm and dry.  Neurological:     General: No focal deficit present.     Mental Status: She is alert and oriented to person, place,  and time. Mental status is at baseline.  Psychiatric:        Mood and Affect: Mood normal.        Behavior: Behavior normal.        Thought Content: Thought content normal.        Judgment: Judgment normal.    Lab Results  Component Value Date   HGBA1C 8.6 (H) 04/24/2023   HGBA1C 8.2 (A) 04/24/2023   HGBA1C 7.4 (H) 10/28/2022    Lab Results  Component Value Date   CREATININE 0.96 04/24/2023   CREATININE 0.88 01/01/2023   CREATININE 0.95 10/28/2022    Lab Results  Component Value Date   WBC 8.4 04/24/2023   HGB 14.0 04/24/2023   HCT 42.8 04/24/2023   PLT 326.0 04/24/2023   GLUCOSE 149 (H) 04/24/2023   CHOL 197 04/24/2023   TRIG 140.0 04/24/2023   HDL 44.90 04/24/2023   LDLDIRECT 139.0 04/24/2023   LDLCALC 124 (H) 04/24/2023   ALT 10 04/24/2023   AST 17 04/24/2023   NA 138 04/24/2023   K 4.0 04/24/2023   CL 99 04/24/2023   CREATININE 0.96 04/24/2023   BUN 15 04/24/2023   CO2 28 04/24/2023   TSH 3.24 04/24/2023   INR 1.37 09/04/2016   HGBA1C 8.6 (H) 04/24/2023   MICROALBUR 1.9 07/31/2022    ECHOCARDIOGRAM COMPLETE Result Date: 10/07/2022    ECHOCARDIOGRAM REPORT   Patient Name:   ITZELLE GAINS Date of Exam: 10/07/2022 Medical Rec #:  [id number]           Height:       65.0 in Accession #:    [id number]          Weight:       300.0 lb Date of Birth:  [birth year]          BSA:          2.350 m Patient Age:    81 years            BP:           132/81 mmHg Patient Gender: F                   HR:           68 bpm. Exam Location:  [facility] Procedure: 2D Echo, Cardiac Doppler and Color Doppler Indications:     Atrial Fibrillation  History:          Patient has prior history of Echocardiogram examinations, most                  recent 07/14/2015. Stroke, Arrythmias:Atrial Fibrillation,                  Signs/Symptoms:Dizziness/Lightheadedness and Edema; Risk                  Factors:Hypertension, Diabetes, Dyslipidemia and Sleep Apnea.  Sonographer:     Mikki Harbor Referring Phys:  [id number] Sherlene Shams Diagnosing Phys: Lorine Bears MD  Sonographer Comments: Technically difficult study due to poor echo windows and patient is obese. IMPRESSIONS  1. Left ventricular ejection fraction, by estimation, is 55 to 60%. The left ventricle has normal function. The left ventricle has no regional wall motion abnormalities. Left ventricular diastolic parameters are consistent with Grade I diastolic dysfunction (impaired relaxation).  2. Right ventricular systolic function is normal. The right ventricular size is normal. There is normal pulmonary artery systolic pressure.  3. Left atrial size was mildly dilated.  4. The mitral valve is normal in structure. No evidence of mitral valve regurgitation. No evidence of mitral stenosis. Moderate mitral annular calcification.  5. The aortic valve is calcified. Aortic valve regurgitation is not visualized. Aortic valve sclerosis/calcification is present, without any evidence of aortic stenosis.  6. The inferior vena cava is normal in size with greater than 50% respiratory variability, suggesting right atrial pressure of 3 mmHg. FINDINGS  Left Ventricle: Left ventricular ejection fraction, by estimation, is 55 to 60%. The left ventricle has normal function. The left ventricle has no regional wall motion abnormalities. The left ventricular internal cavity size was normal in size. There is  no left ventricular hypertrophy. Left ventricular diastolic parameters are consistent with Grade I diastolic dysfunction (impaired relaxation). Right Ventricle: The right ventricular size is normal. No increase in right ventricular wall  thickness. Right ventricular systolic function is normal. There is normal pulmonary artery systolic pressure. The tricuspid regurgitant velocity is 2.07 m/s, and  with an assumed right atrial pressure of 3 mmHg, the estimated right ventricular systolic pressure is 20.1 mmHg. Left Atrium: Left atrial size was mildly dilated. Right Atrium: Right atrial size was normal in size. Pericardium: There is no evidence of pericardial effusion. Mitral Valve: The mitral valve is normal in structure. Moderate mitral annular calcification. No evidence of mitral valve regurgitation. No evidence of mitral valve stenosis. MV peak gradient, 3.8 mmHg. The mean mitral valve gradient is 2.0 mmHg. Tricuspid Valve: The tricuspid valve is normal in structure. Tricuspid valve regurgitation is trivial. No evidence of tricuspid stenosis. Aortic Valve: The aortic valve is calcified. Aortic valve regurgitation is not visualized. Aortic valve sclerosis/calcification is present, without any evidence of aortic stenosis. Aortic valve mean gradient measures 4.0 mmHg. Aortic valve peak gradient measures 6.8 mmHg. Aortic valve area, by VTI measures 2.49 cm. Pulmonic Valve: The pulmonic valve was normal in structure. Pulmonic valve regurgitation is not visualized. No evidence of pulmonic stenosis. Aorta: The aortic root is normal in size and structure. Venous: The inferior vena cava is normal in size with greater than 50% respiratory variability, suggesting right atrial pressure of 3 mmHg. IAS/Shunts: No atrial level shunt detected by color flow Doppler.  LEFT VENTRICLE PLAX 2D LVIDd:         5.00 cm   Diastology LVIDs:         3.20 cm   LV e' medial:    9.14 cm/s LV PW:         1.00 cm   LV E/e' medial:  8.2 LV IVS:        1.10 cm   LV e' lateral:   8.16 cm/s LVOT diam:     2.00 cm   LV E/e' lateral: 9.2 LV SV:         79 LV SV Index:   34 LVOT Area:     3.14 cm  RIGHT VENTRICLE RV Basal diam:  3.80 cm RV Mid diam:    3.20 cm RV S prime:     12.70  cm/s TAPSE (M-mode): 2.4 cm LEFT ATRIUM             Index        RIGHT ATRIUM           Index LA diam:        4.00 cm 1.70 cm/m   RA Area:     15.50 cm LA Vol (A2C):   55.9 ml 23.79 ml/m  RA Volume:   41.60 ml  17.70  ml/m LA Vol (A4C):   65.6 ml 27.91 ml/m LA Biplane Vol: 60.6 ml 25.79 ml/m  AORTIC VALVE                    PULMONIC VALVE AV Area (Vmax):    2.51 cm     PV Vmax:       0.72 m/s AV Area (Vmean):   2.53 cm     PV Peak grad:  2.1 mmHg AV Area (VTI):     2.49 cm AV Vmax:           130.00 cm/s AV Vmean:          85.800 cm/s AV VTI:            0.317 m AV Peak Grad:      6.8 mmHg AV Mean Grad:      4.0 mmHg LVOT Vmax:         104.00 cm/s LVOT Vmean:        69.000 cm/s LVOT VTI:          0.251 m LVOT/AV VTI ratio: 0.79  AORTA Ao Root diam: 3.10 cm MITRAL VALVE               TRICUSPID VALVE MV Area (PHT): 2.71 cm    TR Peak grad:   17.1 mmHg MV Area VTI:   2.74 cm    TR Vmax:        207.00 cm/s MV Peak grad:  3.8 mmHg MV Mean grad:  2.0 mmHg    SHUNTS MV Vmax:       0.98 m/s    Systemic VTI:  0.25 m MV Vmean:      59.1 cm/s   Systemic Diam: 2.00 cm MV Decel Time: 280 msec MV E velocity: 74.90 cm/s MV A velocity: 98.50 cm/s MV E/A ratio:  0.76 Lorine Bears MD Electronically signed by Lorine Bears MD Signature Date/Time: 10/07/2022/3:33:42 PM    Final     Assessment & Plan:  .Essential hypertension, benign Assessment & Plan: Now at  Goal on maximal dose of amlodipine 50 mg spironolactone metoprolol and 20 mg furosemide. ACE/ARB are C/i due to history of hyperkalemia/ with prior trial of lisinopril.   Orders: -     Comprehensive metabolic panel  Hyperlipidemia associated with type 2 diabetes mellitus (HCC) Assessment & Plan: Loss of control noted  by current A1c on 25 units of lantus and will need mealtime insulin added; unfortunately she has not brought her CBG reader with her .  Will advise her to increase dose to 30 units and, return in one month   Lab Results  Component Value Date    HGBA1C 8.6 (H) 04/24/2023   Lab Results  Component Value Date   MICROALBUR 1.9 07/31/2022   MICROALBUR 14.6 (H) 04/20/2021       Orders: -     Hemoglobin A1c -     Comprehensive metabolic panel -     Lipid panel -     LDL cholesterol, direct -     POCT glycosylated hemoglobin (Hb A1C)  Polyarthritis -     Sedimentation rate -     C-reactive protein  Rheumatoid arthritis involving multiple sites with positive rheumatoid factor (HCC) Assessment & Plan: Unclear if she is still taking methotrexate and plaquenil since she has been lost to follow up with Allena Katz .  ESR is elevated  , CRP is normal.   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 She is deferring referral to hand surgeon   Long-term use of immunosuppressant medication Assessment & Plan: She has been taking methotrexate and placquenil with no rheumatology follow up since Jan 2024  Lab Results  Component Value Date   WBC 8.4 04/24/2023   HGB 14.0 04/24/2023   HCT 42.8 04/24/2023   MCV 92.4 04/24/2023   PLT 326.0 04/24/2023   Lab Results  Component Value Date   ALT 10 04/24/2023   AST 17 04/24/2023   ALKPHOS 72 04/24/2023   BILITOT 0.4 04/24/2023      Diarrhea in adult patient Assessment & Plan: Present since 2018,  not imporved with dicyclomine.  DIAGNOSIS of IBS is in  QUESTION given failure to resolve after 3 years.  Given her uncontrolled diabetes may have gastroparesis , SB overgrowth.    Given her history of gallstone pancreatitis,  May be due to pancreatic insufficiency.  Gastric emptying study and GI referral made Will give empiric trial of Imodium     Other orders -     amLODIPine Besylate; Take 1 tablet (10 mg total) by mouth daily.  Dispense: 90 tablet; Refill: 1 -     Ezetimibe; Take 1 tablet (10 mg total) by mouth daily.  Dispense: 90 tablet; Refill: 1 -     Pantoprazole Sodium; Take 1 tablet (40 mg total) by mouth daily.  Dispense: 90 tablet; Refill: 1     I provided 48 minutes of face-to-face time during this encounter reviewing patient's last visit with me, patient's  most recent visit with cardiology,  nephrology, and rheumatology currently addressed issues,  and post visit ordering to diagnostics and therapeutics .   Follow-up: Return in about 1 month (around 05/23/2023) for follow up diabetes.   Sherlene Shams, MD

## 2023-04-24 NOTE — Assessment & Plan Note (Signed)
Present since 2018,  not imporved with dicyclomine.  DIAGNOSIS of IBS is in  QUESTION given failure to resolve after 3 years.  Given her uncontrolled diabetes may have gastroparesis , SB overgrowth.    Given her history of gallstone pancreatitis,  May be due to pancreatic insufficiency.  Gastric emptying study and GI referral made Will give empiric trial of Imodium

## 2023-04-24 NOTE — Assessment & Plan Note (Addendum)
Loss of control noted  by current A1c on 25 units of lantus and will need mealtime insulin added; unfortunately she has not brought her CBG reader with her .  Will advise her to increase dose to 30 units and, return in one month   Lab Results  Component Value Date   HGBA1C 8.6 (H) 04/24/2023   Lab Results  Component Value Date   MICROALBUR 1.9 07/31/2022   MICROALBUR 14.6 (H) 04/20/2021

## 2023-04-24 NOTE — Assessment & Plan Note (Signed)
She has been taking methotrexate and placquenil with no rheumatology follow up since Jan 2024  Lab Results  Component Value Date   WBC 8.4 04/24/2023   HGB 14.0 04/24/2023   HCT 42.8 04/24/2023   MCV 92.4 04/24/2023   PLT 326.0 04/24/2023   Lab Results  Component Value Date   ALT 10 04/24/2023   AST 17 04/24/2023   ALKPHOS 72 04/24/2023   BILITOT 0.4 04/24/2023

## 2023-04-24 NOTE — Assessment & Plan Note (Signed)
Now at  Goal on maximal dose of amlodipine 50 mg spironolactone metoprolol and 20 mg furosemide. ACE/ARB are C/i due to history of hyperkalemia/ with prior trial of lisinopril.

## 2023-04-24 NOTE — Patient Instructions (Signed)
Your diabetes is uncontrolled  based on your A1c of 8.2 today  You will need to return for another visit with your SENSOR so I can review your sugars  I am making a referral to GI to evaluate your diarrhea  We will check with Dr Allena Katz to see if he is refilling your rheumatoid arthritis medications  Continue using the diclofenac arthritis gel on your hands

## 2023-04-24 NOTE — Assessment & Plan Note (Addendum)
Unclear if she is still taking methotrexate and plaquenil since she has been lost to follow up with Bonnie Hinton .  ESR is elevated  , CRP is normal.   Lab Results  Component Value Date   ESRSEDRATE 82 (H) 04/24/2023   Lab Results  Component Value Date   CRP 1.4 04/24/2023

## 2023-04-25 ENCOUNTER — Ambulatory Visit: Payer: Medicare Other | Admitting: Podiatry

## 2023-04-30 NOTE — Progress Notes (Signed)
 Cardiology Clinic Note   Date: 05/02/2023 ID: Bonnie Hinton, Blecher 1940-10-23, MRN [id number]  Primary Cardiologist:  Redell Cave, MD  Chief Complaint   AMENDA DUCLOS is a 83 y.o. female who presents to the clinic today for routine follow up.   Patient Profile   Bonnie Hinton is followed by Dr. Cave for the history outlined below.      Past medical history significant for: PAF. Onset May 2024.  14-day ZIO 08/26/2022: HR 36 to 150 bpm, average 86 bpm.  100% A-fib.  38 pauses, longest 3.9 seconds.  Rare ventricular ectopy. Echo 10/07/2022: EF 55 to 60%.  No RWMA.  Grade I DD.  Normal RV size/function.  Normal PA pressure.  Mild LAE.  Moderate MAC.  Aortic valve sclerosis/calcification without stenosis. Hypertension. Renal artery ultrasound 06/13/2021: Normal size kidneys bilaterally.  No evidence of renal artery stenosis bilaterally. Limited view of kidneys due to body habitus.  Only the distal artery flow is seen with minimal confidence but shows no significant stenosis. Hyperlipidemia. Lipid panel 04/24/2023: LDL 139, HDL 45, TG 140, total 197. OSA. CPAP. T2DM. CVA.  In summary, patient was seen by Dr. Monette in March 2017 for preoperative risk assessment prior to knee replacement. Patient had no cardiac complaints but reported being mostly sedentary secondary to mobility issues. She underwent echo and nuclear stress test for risk stratification. Echo showed EF 55-60%, no RWMA, normal diastolic parameters. Nuclear stress test was a low risk study with no significant ischemia.   Patient was first evaluated by Dr. Cave on 10/10/2022 for afib at the request of Dr. Marylynn. Patient was diagnosed with afib at routine visit with PCP in May 2024. Patient was started on metoprolol  and Xarelto . Patient denied palpitations. Sleep study demonstrated OSA and patient started CPAP. Zio monitor and echo ordered by PCP and performed prior to office visit.  14 day Zio  showed 100% afib burden with 38 pauses as detailed above. Echo showed normal LV/RV function as detailed above. Patient was in sinus rhythm at the time of her visit. It was felt that BB and CPAP were contributing to rhythm control.      History of Present Illness    Today, patient is here alone. Patient denies shortness of breath, dyspnea on exertion, orthopnea or PND. She sleeps in her bed part of the time and in a chair at other times related to comfort not difficulty breathing. She has chronic lower extremity edema that progresses throughout the day. No chest pain, pressure, or tightness. No palpitations. She reports she had no cardiac awareness of being in afib in May. She has not been using her CPAP secondary to developing an eye infection that was attributed to the air blowing into my eye from the CPAP. The infection has cleared and she is cleaning her CPAP equipment so she can resume use. She is mostly sedentary and utilizes a wheelchair. She is trying to get into physical therapy to get stronger. She does some seated exercises at home but is inconsistent.     ROS: All other systems reviewed and are otherwise negative except as noted in History of Present Illness.  EKGs/Labs Reviewed    EKG Interpretation Date/Time:  Friday May 02 2023 09:23:10 EST Ventricular Rate:  87 PR Interval:  174 QRS Duration:  84 QT Interval:  370 QTC Calculation: 445 R Axis:   -9  Text Interpretation: Normal sinus rhythm Nonspecific ST abnormality When compared with ECG of 10-Oct-2022 10:56, No significant  change was found Confirmed by Loistine Sober [id number]) on 05/02/2023 9:26:24 AM   04/24/2023: ALT 10; AST 17; BUN 15; Creatinine, Ser 0.96; Potassium 4.0; Sodium 138   04/24/2023: Hemoglobin 14.0; WBC 8.4   04/24/2023: TSH 3.24    Risk Assessment/Calculations     CHA2DS2-VASc Score = 7   This indicates a 11.2% annual risk of stroke. The patient's score is based upon: CHF History: 0 HTN  History: 1 Diabetes History: 1 Stroke History: 2 Vascular Disease History: 0 Age Score: 2 Gender Score: 1             Physical Exam    VS:  BP 122/70 (BP Location: Left Wrist, Patient Position: Sitting, Cuff Size: Normal)   Pulse 87   Ht 5' 5 (1.651 m)   Wt (!) 317 lb (143.8 kg) Comment: home weight today per patient  SpO2 94%   BMI 52.75 kg/m  , BMI Body mass index is 52.75 kg/m.  GEN: Well nourished, well developed, in no acute distress. Neck: No JVD or carotid bruits. Cardiac:  RRR. No murmurs. No rubs or gallops.   Respiratory:  Respirations regular and unlabored. Clear to auscultation without rales, wheezing or rhonchi. GI: Soft, nontender, nondistended. Extremities: Radials/DP/PT 2+ and equal bilaterally. No clubbing or cyanosis. Moderate, nonpitting lower extremity edema consistent with lymphedema.  Skin: Warm and dry, no rash. Neuro: Strength intact.  Assessment & Plan   PAF Onset May 2024. 14 day Zio June 2024 showed 100% afib with 38 pauses, longest 3.9 seconds. Patient was in sinus rhythm in July attributed to BB and use of CAP. Patient denies palpitations. She does not recall having cardiac awareness of afib in May. Denies spontaneous bleeding concerns. EKG shows sinus rhythm today.  -Continue metoprolol  and Xarelto .   Hypertension Renal artery US  March 2023 had limited view of kidneys due to body habitus and only distal artery flow was seen with minimal confidence but showed no significant stenosis. BP today 122/70. No report of headaches or dizziness.  -Continue amlodipine , metoprolol , spironolactone .  Hyperlipidemia/statin intolerance LDL January 2025 139, not at goal. Patient reports atorvastatin  and rosuvastatin  have caused stomach upset.  -Continue Zetia . -Followed by PCP.  OSA Patient has not been using her CPAP secondary to developing an eye infection attributed to the air blowing into my eye from the CPAP. The infection has resolved and she is  cleaning her CPAP equipment so she can resume using it tonight. Discussed untreated OSA as a trigger for afib and the importance of consistent CPAP use. Patient verbalized understanding.  -Resume use of CPAP as soon as able and stay consistent with use.    Disposition: Return in 6 months or sooner as needed.          Signed, Sober HERO. Collyns Mcquigg, DNP, NP-C

## 2023-05-02 ENCOUNTER — Ambulatory Visit: Payer: Medicare Other | Attending: Student | Admitting: Student

## 2023-05-02 ENCOUNTER — Encounter: Payer: Self-pay | Admitting: Student

## 2023-05-02 VITALS — BP 122/70 | HR 87 | Ht 65.0 in | Wt 317.0 lb

## 2023-05-02 DIAGNOSIS — I480 Paroxysmal atrial fibrillation: Secondary | ICD-10-CM

## 2023-05-02 DIAGNOSIS — I10 Essential (primary) hypertension: Secondary | ICD-10-CM

## 2023-05-02 DIAGNOSIS — G4733 Obstructive sleep apnea (adult) (pediatric): Secondary | ICD-10-CM

## 2023-05-02 DIAGNOSIS — E785 Hyperlipidemia, unspecified: Secondary | ICD-10-CM | POA: Diagnosis not present

## 2023-05-02 DIAGNOSIS — Z789 Other specified health status: Secondary | ICD-10-CM

## 2023-05-02 NOTE — Patient Instructions (Signed)
 Medication Instructions:  Your Physician recommend you continue on your current medication as directed.    *If you need a refill on your cardiac medications before your next appointment, please call your pharmacy*   Lab Work: None ordered at this time  If you have labs (blood work) drawn today and your tests are completely normal, you will receive your results only by: MyChart Message (if you have MyChart) OR A paper copy in the mail If you have any lab test that is abnormal or we need to change your treatment, we will call you to review the results.   Testing/Procedures: None ordered at this time    Follow-Up: At [facility], you and your health needs are our priority.  As part of our continuing mission to provide you with exceptional heart care, we have created designated Provider Care Teams.  These Care Teams include your primary Cardiologist (physician) and Advanced Practice Providers (APPs -  Physician Assistants and Nurse Practitioners) who all work together to provide you with the care you need, when you need it.  We recommend signing up for the patient portal called "MyChart".  Sign up information is provided on this After Visit Summary.  MyChart is used to connect with patients for Virtual Visits (Telemedicine).  Patients are able to view lab/test results, encounter notes, upcoming appointments, etc.  Non-urgent messages can be sent to your provider as well.   To learn more about what you can do with MyChart, go to [email or web address].    Your next appointment:   6 month(s)  Provider:   You may see Debbe Odea, MD or one of the following Advanced Practice Providers on your designated Care Team:   Nicolasa Ducking, NP Eula Listen, PA-C Cadence Fransico Michael, PA-C Charlsie Quest, NP Carlos Levering, NP

## 2023-05-19 ENCOUNTER — Ambulatory Visit: Payer: Medicare Other | Admitting: Internal Medicine

## 2023-05-19 ENCOUNTER — Other Ambulatory Visit: Payer: Self-pay | Admitting: Internal Medicine

## 2023-05-19 ENCOUNTER — Encounter: Payer: Self-pay | Admitting: Internal Medicine

## 2023-05-19 VITALS — BP 130/68 | HR 76 | Ht 65.0 in | Wt 317.0 lb

## 2023-05-19 DIAGNOSIS — Z993 Dependence on wheelchair: Secondary | ICD-10-CM

## 2023-05-19 DIAGNOSIS — Z794 Long term (current) use of insulin: Secondary | ICD-10-CM

## 2023-05-19 DIAGNOSIS — E1165 Type 2 diabetes mellitus with hyperglycemia: Secondary | ICD-10-CM

## 2023-05-19 NOTE — Progress Notes (Unsigned)
 Subjective:  Patient ID: Bonnie Hinton, female    DOB: July 20, 1940  Age: 83 y.o. MRN: [id number]  CC: There were no encounter diagnoses.   HPI Bonnie Hinton presents for  Chief Complaint  Patient presents with   Medical Management of Chronic Issues    1 month follow up on diabetes   UNCONTROLLED TYPE 2 DIABETES.  I have downloaded and reviewed the data from patient's continuous blood glucose monitor. Patient's  sugars have been  IN RANGE  60   % OF THE TIME,   BELOW RANGE  0 % of the time.  And ABOVE RANGE 40% OF THE TIME .  Medications currently taking : 30 units of lantus daily (increased one month ago from 25 ) in the evening    has past intolerances to Victoza  (pancreatitis) and metformin   She skips meals  if her sugar is >150.  She doesn't  grocery shop without her daughter.  She has ordered low GI meals from a company  that she is starting tomorrow (she cannot cook)   and she is planning to eat fish and follow the [employer] for [place].    SHE IS LIMITED IN MOBILITY DUE TO BILATERAL WEAKNESS OF LEGS.  Lab Results  Component Value Date   HGBA1C 8.6 (H) 04/24/2023      Outpatient Medications Prior to Visit  Medication Sig Dispense Refill   acetaminophen (TYLENOL) 325 MG tablet Take 2 tablets (650 mg total) by mouth every 6 (six) hours as needed for mild pain (or Fever >/= 101).     amLODipine (NORVASC) 10 MG tablet Take 1 tablet (10 mg total) by mouth daily. 90 tablet 1   cetirizine (ZYRTEC) 10 MG tablet Take 1 tablet (10 mg total) by mouth daily. 90 tablet 3   Cholecalciferol (VITAMIN D3) 2000 units TABS Take 1 tablet by mouth daily.     Continuous Glucose Sensor (FREESTYLE LIBRE 2 SENSOR) MISC      cyanocobalamin (VITAMIN B12) 1000 MCG/ML injection Inject 1 mL (1,000 mcg total) into the muscle every 30 (thirty) days. 3 mL 4   cycloSPORINE (RESTASIS) 0.05 % ophthalmic emulsion Place 1 drop into both eyes 2 (two) times daily.     dicyclomine (BENTYL) 20 MG tablet  TAKE 1 TABLET BY MOUTH FOUR TIMES DAILY BEFORE MEALS AND AT BEDTIME 360 tablet 2   ezetimibe (ZETIA) 10 MG tablet Take 1 tablet (10 mg total) by mouth daily. 90 tablet 1   furosemide (LASIX) 20 MG tablet Take 1 tablet (20 mg total) by mouth daily. 90 tablet 3   hydroxychloroquine (PLAQUENIL) 200 MG tablet Take 200 mg by mouth 2 (two) times daily.     insulin glargine (LANTUS SOLOSTAR) 100 UNIT/ML Solostar Pen Inject 25 Units into the skin daily. 30 mL 10   levothyroxine (SYNTHROID) 150 MCG tablet Take 1 tablet (150 mcg total) by mouth daily. 90 tablet 3   methotrexate (RHEUMATREX) 2.5 MG tablet Take 5 tablets by mouth once a week.     metoprolol tartrate (LOPRESSOR) 50 MG tablet TAKE ONE TABLET BY MOUTH TWICE A DAY 180 tablet 1   mupirocin ointment (BACTROBAN) 2 % APPLY TO AFFECTED AREA TWICE A DAY 22 g 11   nystatin cream (MYCOSTATIN) APPLY TO THE AFFECTED AREA(S) TWICE DAILY 90 g 2   pantoprazole (PROTONIX) 40 MG tablet Take 1 tablet (40 mg total) by mouth daily. 90 tablet 1   rivaroxaban (XARELTO) 20 MG TABS tablet TAKE ONE TABLET  BY MOUTH EVERY DAY WITH SUPPER 90 tablet 1   spironolactone (ALDACTONE) 50 MG tablet Take 1 tablet (50 mg total) by mouth daily. 90 tablet 3   tiZANidine (ZANAFLEX) 4 MG tablet TAKE 1 TABLET BY MOUTH EVERY 6 HOURS AS NEEDED FOR MUSCLE SPASMS 270 tablet 3   trospium (SANCTURA) 20 MG tablet TAKE ONE TABLET BY MOUTH TWICE A DAY 180 tablet 2   amoxicillin (AMOXIL) 500 MG capsule TAKE 4 CAPSULES BY MOUTH 1 HOUR PRIOR TO DENTAL PROCEDURE (Patient not taking: Reported on 05/19/2023) 4 capsule 2   benzonatate (TESSALON) 200 MG capsule TAKE 1 CAPSULE (200 MG TOTAL) BY MOUTH 3 (THREE) TIMES DAILY AS NEEDED FOR COUGH. (Patient not taking: Reported on 05/19/2023) 60 capsule 0   folic acid (FOLVITE) 1 MG tablet Take 1 mg by mouth daily. (Patient not taking: Reported on 05/19/2023)     DROPLET PEN NEEDLES 31G X 5 MM MISC USE DAILY (Patient not taking: Reported on 05/02/2023) 100 each 10    No facility-administered medications prior to visit.    Review of Systems;  Patient denies headache, fevers, malaise, unintentional weight loss, skin rash, eye pain, sinus congestion and sinus pain, sore throat, dysphagia,  hemoptysis , cough, dyspnea, wheezing, chest pain, palpitations, orthopnea, edema, abdominal pain, nausea, melena, diarrhea, constipation, flank pain, dysuria, hematuria, urinary  Frequency, nocturia, numbness, tingling, seizures,  Focal weakness, Loss of consciousness,  Tremor, insomnia, depression, anxiety, and suicidal ideation.      Objective:  BP 130/68   Pulse 76   Ht 5\' 5"  (1.651 m)   Wt (!) 317 lb (143.8 kg)   SpO2 97%   BMI 52.75 kg/m   BP Readings from Last 3 Encounters:  05/19/23 130/68  05/02/23 122/70  04/24/23 132/68    Wt Readings from Last 3 Encounters:  05/19/23 (!) 317 lb (143.8 kg)  05/02/23 (!) 317 lb (143.8 kg)  04/24/23 (!) 313 lb 8 oz (142.2 kg)    Physical Exam  Lab Results  Component Value Date   HGBA1C 8.6 (H) 04/24/2023   HGBA1C 8.2 (A) 04/24/2023   HGBA1C 7.4 (H) 10/28/2022    Lab Results  Component Value Date   CREATININE 0.96 04/24/2023   CREATININE 0.88 01/01/2023   CREATININE 0.95 10/28/2022    Lab Results  Component Value Date   WBC 8.4 04/24/2023   HGB 14.0 04/24/2023   HCT 42.8 04/24/2023   PLT 326.0 04/24/2023   GLUCOSE 149 (H) 04/24/2023   CHOL 197 04/24/2023   TRIG 140.0 04/24/2023   HDL 44.90 04/24/2023   LDLDIRECT 139.0 04/24/2023   LDLCALC 124 (H) 04/24/2023   ALT 10 04/24/2023   AST 17 04/24/2023   NA 138 04/24/2023   K 4.0 04/24/2023   CL 99 04/24/2023   CREATININE 0.96 04/24/2023   BUN 15 04/24/2023   CO2 28 04/24/2023   TSH 3.24 04/24/2023   INR 1.37 09/04/2016   HGBA1C 8.6 (H) 04/24/2023   MICROALBUR 1.9 07/31/2022    ECHOCARDIOGRAM COMPLETE Result Date: 10/07/2022    ECHOCARDIOGRAM REPORT   Patient Name:   Bonnie Hinton Date of Exam: 10/07/2022 Medical Rec #:  [id number]            Height:       65.0 in Accession #:    [id number]          Weight:       300.0 lb Date of Birth:  12-Feb-1941  BSA:          2.350 m Patient Age:    81 years            BP:           132/81 mmHg Patient Gender: F                   HR:           68 bpm. Exam Location:  [facility] Procedure: 2D Echo, Cardiac Doppler and Color Doppler Indications:     Atrial Fibrillation  History:         Patient has prior history of Echocardiogram examinations, most                  recent 07/14/2015. Stroke, Arrythmias:Atrial Fibrillation,                  Signs/Symptoms:Dizziness/Lightheadedness and Edema; Risk                  Factors:Hypertension, Diabetes, Dyslipidemia and Sleep Apnea.  Sonographer:     Mikki Harbor Referring Phys:  [id number] Sherlene Shams Diagnosing Phys: Lorine Bears MD  Sonographer Comments: Technically difficult study due to poor echo windows and patient is obese. IMPRESSIONS  1. Left ventricular ejection fraction, by estimation, is 55 to 60%. The left ventricle has normal function. The left ventricle has no regional wall motion abnormalities. Left ventricular diastolic parameters are consistent with Grade I diastolic dysfunction (impaired relaxation).  2. Right ventricular systolic function is normal. The right ventricular size is normal. There is normal pulmonary artery systolic pressure.  3. Left atrial size was mildly dilated.  4. The mitral valve is normal in structure. No evidence of mitral valve regurgitation. No evidence of mitral stenosis. Moderate mitral annular calcification.  5. The aortic valve is calcified. Aortic valve regurgitation is not visualized. Aortic valve sclerosis/calcification is present, without any evidence of aortic stenosis.  6. The inferior vena cava is normal in size with greater than 50% respiratory variability, suggesting right atrial pressure of 3 mmHg. FINDINGS  Left Ventricle: Left ventricular ejection fraction, by estimation, is 55 to 60%. The left ventricle  has normal function. The left ventricle has no regional wall motion abnormalities. The left ventricular internal cavity size was normal in size. There is  no left ventricular hypertrophy. Left ventricular diastolic parameters are consistent with Grade I diastolic dysfunction (impaired relaxation). Right Ventricle: The right ventricular size is normal. No increase in right ventricular wall thickness. Right ventricular systolic function is normal. There is normal pulmonary artery systolic pressure. The tricuspid regurgitant velocity is 2.07 m/s, and  with an assumed right atrial pressure of 3 mmHg, the estimated right ventricular systolic pressure is 20.1 mmHg. Left Atrium: Left atrial size was mildly dilated. Right Atrium: Right atrial size was normal in size. Pericardium: There is no evidence of pericardial effusion. Mitral Valve: The mitral valve is normal in structure. Moderate mitral annular calcification. No evidence of mitral valve regurgitation. No evidence of mitral valve stenosis. MV peak gradient, 3.8 mmHg. The mean mitral valve gradient is 2.0 mmHg. Tricuspid Valve: The tricuspid valve is normal in structure. Tricuspid valve regurgitation is trivial. No evidence of tricuspid stenosis. Aortic Valve: The aortic valve is calcified. Aortic valve regurgitation is not visualized. Aortic valve sclerosis/calcification is present, without any evidence of aortic stenosis. Aortic valve mean gradient measures 4.0 mmHg. Aortic valve peak gradient measures 6.8 mmHg. Aortic valve area, by VTI measures 2.49 cm. Pulmonic  Valve: The pulmonic valve was normal in structure. Pulmonic valve regurgitation is not visualized. No evidence of pulmonic stenosis. Aorta: The aortic root is normal in size and structure. Venous: The inferior vena cava is normal in size with greater than 50% respiratory variability, suggesting right atrial pressure of 3 mmHg. IAS/Shunts: No atrial level shunt detected by color flow Doppler.  LEFT  VENTRICLE PLAX 2D LVIDd:         5.00 cm   Diastology LVIDs:         3.20 cm   LV e' medial:    9.14 cm/s LV PW:         1.00 cm   LV E/e' medial:  8.2 LV IVS:        1.10 cm   LV e' lateral:   8.16 cm/s LVOT diam:     2.00 cm   LV E/e' lateral: 9.2 LV SV:         79 LV SV Index:   34 LVOT Area:     3.14 cm  RIGHT VENTRICLE RV Basal diam:  3.80 cm RV Mid diam:    3.20 cm RV S prime:     12.70 cm/s TAPSE (M-mode): 2.4 cm LEFT ATRIUM             Index        RIGHT ATRIUM           Index LA diam:        4.00 cm 1.70 cm/m   RA Area:     15.50 cm LA Vol (A2C):   55.9 ml 23.79 ml/m  RA Volume:   41.60 ml  17.70 ml/m LA Vol (A4C):   65.6 ml 27.91 ml/m LA Biplane Vol: 60.6 ml 25.79 ml/m  AORTIC VALVE                    PULMONIC VALVE AV Area (Vmax):    2.51 cm     PV Vmax:       0.72 m/s AV Area (Vmean):   2.53 cm     PV Peak grad:  2.1 mmHg AV Area (VTI):     2.49 cm AV Vmax:           130.00 cm/s AV Vmean:          85.800 cm/s AV VTI:            0.317 m AV Peak Grad:      6.8 mmHg AV Mean Grad:      4.0 mmHg LVOT Vmax:         104.00 cm/s LVOT Vmean:        69.000 cm/s LVOT VTI:          0.251 m LVOT/AV VTI ratio: 0.79  AORTA Ao Root diam: 3.10 cm MITRAL VALVE               TRICUSPID VALVE MV Area (PHT): 2.71 cm    TR Peak grad:   17.1 mmHg MV Area VTI:   2.74 cm    TR Vmax:        207.00 cm/s MV Peak grad:  3.8 mmHg MV Mean grad:  2.0 mmHg    SHUNTS MV Vmax:       0.98 m/s    Systemic VTI:  0.25 m MV Vmean:      59.1 cm/s   Systemic Diam: 2.00 cm MV Decel Time: 280 msec MV E velocity: 74.90 cm/s MV A velocity:  98.50 cm/s MV E/A ratio:  0.76 Lorine Bears MD Electronically signed by Lorine Bears MD Signature Date/Time: 10/07/2022/3:33:42 PM    Final     Assessment & Plan:  .There are no diagnoses linked to this encounter.   I spent 34 minutes on the day of this face to face encounter reviewing patient's  most recent visit with cardiology,  nephrology,  and neurology,  prior relevant surgical and non  surgical procedures, recent  labs and imaging studies, counseling on weight management,  reviewing the assessment and plan with patient, and post visit ordering and reviewing of  diagnostics and therapeutics with patient  .   Follow-up: No follow-ups on file.   Sherlene Shams, MD

## 2023-05-19 NOTE — Patient Instructions (Addendum)
 Your blood sugars are likely to chang over the next 2 weeks because of the meals you have ordered,  so lets' get together again in 2 weeks to review blood sugars.  Continue 30 units of Lantus daily    Don't skip  meals.  If you are not hungry , YOU SHOULD DRINK A LOW CARB PROTEIN SHAKE  (SEE BELOW) :  Low carb Breakfast options: All of these are available at [employer], [employer],  [employer], and [employer] a And taste good   Premier Protein (best tasting)  Atkins Advantage Muscle Milk EAS AdvantEdge   Here are some other breakfast options that are low carb:  Vilma Meckel "D'Light" frozen entrees ( SEE BELOW):    Frittata  : muffin shaped quiches that contain eggs + cheese + veggies+ meat (no bread) Egg'wich :  [place] sausage patty served on a biscuit made of frittat (no bread)   Both can be microwaved in 2 minutes   Gerre Peb[employer]: makes an entree called "Just Crack n Egg;" yogurt sized cup ith diced ham, cheese and potatoes: add one egg and microwave for 2 minutes   For lunch:   [employer]  MAKES bagged salads:  Try  the Asian, the SouthWestern,  And the Caesar salads, complete with dressings, nuts and croutons .  Found  in the bagged salad section   Just add a protein (tuna,  Chicken etc ) and omit the won ton [employer] can also try the Healthy Choice "Steamer "  and "LowCarb Power Bowls"     AVOID BANANAS ,  GRAPES, AND  PINEAPPLE

## 2023-05-20 DIAGNOSIS — E1165 Type 2 diabetes mellitus with hyperglycemia: Secondary | ICD-10-CM | POA: Insufficient documentation

## 2023-05-20 NOTE — Assessment & Plan Note (Signed)
 I have downloaded and reviewed the data from patient's continuous blood glucose monitor. Patient's  sugars have been  IN RANGE   60  % OF THE TIME,   BELOW RANGE    % of the time.  And ABOVE RANGE 40   % OF THE TIME .  Medication changes were made based on this review as follows: no change today given her plans to start a diet .using prepared meals for diabetics.  Counselling given on dietary restrictions regarding fruits and  starches.   Return in 2 weeks for review of CBG

## 2023-05-20 NOTE — Assessment & Plan Note (Signed)
 Patient 's mobility has deteriorated.despite continued PT> .  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  She cannot ambulate around her home safely on a walker or with a cane due to concurrent arthritis of both shoulders resulting in bilateral arm weakness  .  She cannot prepare meals from a standing position due to bilateral leg weakness  .  She is currently able to navigate her home independently in a manual wheelchair,  b

## 2023-06-02 ENCOUNTER — Other Ambulatory Visit: Payer: Self-pay | Admitting: Internal Medicine

## 2023-06-02 DIAGNOSIS — N3941 Urge incontinence: Secondary | ICD-10-CM

## 2023-06-03 ENCOUNTER — Other Ambulatory Visit: Payer: Self-pay | Admitting: Internal Medicine

## 2023-06-27 ENCOUNTER — Ambulatory Visit: Payer: Self-pay

## 2023-06-27 ENCOUNTER — Other Ambulatory Visit: Payer: Self-pay | Admitting: Internal Medicine

## 2023-06-27 ENCOUNTER — Ambulatory Visit: Payer: Medicare Other | Admitting: Podiatry

## 2023-06-27 NOTE — Telephone Encounter (Signed)
 Spoke with patient to make her aware of Dr Melina Schools recommendations. Patient insisted that she been seen by Dr Darrick Huntsman. I advised patient provider does not have any availability any time soon. I informed patient their is a new MD here in the office and she would be OK to see her, as she can communicate any finding or diagnoses with Dr Darrick Huntsman during or after appointment. Patient agreed and is scheduled for Monday with Dr Charlann Lange at 10:30 AM. Bonnie Hinton

## 2023-06-27 NOTE — Telephone Encounter (Signed)
 Copied from CRM [id number]. Topic: Clinical - Red Word Triage >> Jun 27, 2023 11:51 AM Cammy Copa D wrote: Red Word that prompted transfer to Nurse Triage: PT has bright red rash above ankles both legs, PT had shots for carpal tunnel feb/march. PT thinks this is a reaction to the shots possibly.  Chief Complaint: Rash Symptoms: Redness Frequency: Since Monday Pertinent Negatives: Patient denies fever Disposition: [] ED /[] Urgent Care (no appt availability in office) / [x] Appointment(In office/virtual)/ []  [place] [facility]/ [] Home Care/ [x] Refused Recommended Disposition /[] [place] [facility]/ []  Follow-up with PCP Additional Notes: Patient called in to report a bright red rash on her bilateral lower legs. Patient stated she has been applying Bactroban and the rash is now pink. Patient denied pain, itching and fever. Patient denies spotting and streaking. Patient stated this has happened to her before, but it has been years. Patient stated she has been receiving injections for carpel tunnel and she is wondering if rash is related to injections. Patient did mention that she has a wound on her left leg, but it is healing up. Patient stated that she may need surgery for carpel tunnel and is worried that she won't be cleared for surgery due to rash. Advised appointment. Patient declined due to no availability with Dr. Darrick Huntsman. Patient is requesting a call back and advice on how to proceed from Dr. Darrick Huntsman. Routing to office for their discretion.   Reason for Disposition  [1] Looks infected (spreading redness, pus) AND [2] large red area (> 2 in. or 5 cm)    Patient mentioned wound on left leg.  Answer Assessment - Initial Assessment Questions 1. APPEARANCE of RASH: "Describe the rash."      Bright red, now pink after using mupirocin 2. LOCATION: "Where is the rash located?"      Both legs, above ankle  3. NUMBER: "How many spots are there?"      States skin is just "colored" (denies streaks, spots  and patches) 4. SIZE: "How big are the spots?" (Inches, centimeters or compare to size of a coin)      Denies 5. ONSET: "When did the rash start?"      Monday of this week, received shots for carpal tunnel on 05/12/23 and 05/27/23 6. ITCHING: "Does the rash itch?" If Yes, ask: "How bad is the itch?"  (Scale 0-10; or none, mild, moderate, severe)     Denies 7. PAIN: "Does the rash hurt?" If Yes, ask: "How bad is the pain?"  (Scale 0-10; or none, mild, moderate, severe)    - NONE (0): no pain    - MILD (1-3): doesn't interfere with normal activities     - MODERATE (4-7): interferes with normal activities or awakens from sleep     - SEVERE (8-10): excruciating pain, unable to do any normal activities     Denies 8. OTHER SYMPTOMS: "Do you have any other symptoms?" (e.g., fever)     Open wound on left leg that has closed, denies fever  Protocols used: Rash or Redness - Localized-A-AH

## 2023-06-30 ENCOUNTER — Ambulatory Visit

## 2023-06-30 VITALS — BP 118/78 | HR 72 | Temp 98.2°F | Ht 65.0 in | Wt 317.0 lb

## 2023-06-30 DIAGNOSIS — L03115 Cellulitis of right lower limb: Secondary | ICD-10-CM | POA: Diagnosis not present

## 2023-06-30 DIAGNOSIS — L03116 Cellulitis of left lower limb: Secondary | ICD-10-CM

## 2023-06-30 MED ORDER — DOXYCYCLINE HYCLATE 100 MG PO TABS: 100.0000 mg | ORAL_TABLET | Freq: Two times a day (BID) | ORAL | 0 refills | Status: AC

## 2023-06-30 MED ORDER — BACID PO TABS: 2.0000 | ORAL_TABLET | Freq: Every day | ORAL | Status: AC

## 2023-06-30 NOTE — Assessment & Plan Note (Addendum)
 D/D discussed with the patient. Recommend starting treatment with doxycycline 100 mg twice a day for 10 days for MRSA coverage.  Antibiotic side effect including GI upset, pill esophagitis, infection was discussed with the patient.  Counseled on taking probiotic daily to help reduce s/e. Also recommend against using ointment, cream, lotion on the skin till cellulitis is healed. Recommend on raising legs and f/u if symptoms does not improve with antibiotics or acutely worsens.

## 2023-06-30 NOTE — Progress Notes (Signed)
   Acute Office Visit  Subjective:    Patient ID: Bonnie Hinton, female    DOB: [birth year], 83 y.o.   MRN: [id number]  Chief Complaint  Patient presents with   Acute Visit    Rash on lower extremities x 1 week  Painful & Itchy     HPI Established patient to the practice, ( PCP Dr. Darrick Huntsman) presenting for evaluation of b/l lower leg redness, swelling, pain that started on 06/23/23. Patient reports initially she had lower leg swelling, redness, pruritus which progressed to seeping wound on left lower leg.  Patient reports since symptom onset she has noticed improvement in redness of both legs but continues to have pain, pruritus, open wound limited to left lower extremity.  Patient has a history of bilateral lower leg edema for which she is taking furosemide 20 mg daily.    She has tried mupirocin ointment, nystatin cream without significant improvement in her symptoms.  Patient denies insect bite, change in garments, personal hygiene products.  Patient denies fever, chills, nausea, vomiting, shortness of breath.  Medical history is significant for type 2 diabetes with most recent hemoglobin A1c from 04/24/2023 at 8.6%.  ROS As per HPI    Objective:    BP 118/78   Pulse 72   Temp 98.2 F (36.8 C)   Ht 5\' 5"  (1.651 m)   Wt (!) 317 lb (143.8 kg)   SpO2 97%   BMI 52.75 kg/m    Physical Exam Constitutional:      Appearance: She is obese.     Comments: In wheelchair  HENT:     Head: Normocephalic and atraumatic.  Cardiovascular:     Rate and Rhythm: Normal rate.     Pulses: Normal pulses.          Dorsalis pedis pulses are 2+ on the right side and 2+ on the left side.       Posterior tibial pulses are 2+ on the right side and 2+ on the left side.  Pulmonary:     Effort: Pulmonary effort is normal.     Breath sounds: No wheezing or rales.  Musculoskeletal:     Cervical back: Neck supple.     Right lower leg: Edema (pitting 2+) present.     Left lower leg: Edema (pitting  2+) present.  Skin:    Comments: Bilateral lower legs: Erythematous, taut skin of lower extremities extending from ankle to calf that is warm to touch with 2+ pitting edema. Left lower leg with multiple small, blistering wound with serosanguinous discharge noted.    Neurological:     Mental Status: She is alert and oriented to person, place, and time.     No results found for any visits on 06/30/23.     Assessment & Plan:  Pleasant 83 year old female with medical h/o significant for wheel chair dependent, lower leg edema, type II DM on insulin, obesity among others chronic medical conditions presenting for evaluation of b/l lower extremity erythema, edema, wound. D/D includes cellulitis, venous stasis dermatitis    Return if symptoms worsen or fail to improve.  Skip Estimable, MD

## 2023-06-30 NOTE — Patient Instructions (Addendum)
 1) Start taking antibiotic, 1 tablet, twice a day for 10 days. Please take this with glass of water and do not lay down for 30 minutes after taking this antibiotic to help reduce side effects. Take one probiotic daily in the afternoon to help with side effects related to antibiotics.   2) If the swelling, redness does not fully go away in two weeks or if it gets worse despite taking antibiotics please seek medical care right away.

## 2023-08-08 ENCOUNTER — Other Ambulatory Visit: Payer: Self-pay | Admitting: Internal Medicine

## 2023-08-08 NOTE — Telephone Encounter (Signed)
 Historical provider is it okay to send in refill?

## 2023-08-21 ENCOUNTER — Other Ambulatory Visit: Payer: Self-pay | Admitting: Internal Medicine

## 2023-08-21 DIAGNOSIS — Z1231 Encounter for screening mammogram for malignant neoplasm of breast: Secondary | ICD-10-CM

## 2023-08-21 DIAGNOSIS — N63 Unspecified lump in unspecified breast: Secondary | ICD-10-CM

## 2023-08-26 ENCOUNTER — Ambulatory Visit: Payer: Self-pay

## 2023-08-26 NOTE — Telephone Encounter (Signed)
 Copied from CRM [id number]. Topic: General - Other >> Aug 26, 2023  3:50 PM Odilia Bennett D wrote: Reason for CRM: kate from [facility] called stating there is some weeping that came back on the patient left leg and the patient feels like the cellulitis is coming back. The patient can not drive to an appointment because she had carpel tunnel surgery. Polly Brink states that it is a small area that is draining and wet  CB [phone]   Chief Complaint: Redness to leg Symptoms: Redness to left leg with weeping, some swelling, and warmth  Frequency: Constant  Pertinent Negatives: Patient denies any pain, itchiness, or fever  Disposition: [] ED /[] Urgent Care (no appt availability in office) / [x] Appointment(In office/virtual)/ []  [place] [facility]/ [] Home Care/ [x] Refused Recommended Disposition /[] [place] [facility]/ []  Follow-up with PCP Additional Notes: Polly Brink from [facility] called with the patient to report that during her visit she noticed that the patient's left leg was red and weeping. She states it also appears to be more swollen and is warmer than her right leg. Patient was recently treated for cellulitis and they are concerned it is coming back. Patient unable to come in for an appointment due to not being able to drive after recent carpal tunnel surgery. Please advise.     Reason for Disposition  [1] Looks infected (spreading redness, pus) AND [2] no fever  Answer Assessment - Initial Assessment Questions 1. APPEARANCE of RASH: "Describe the rash."      Redness with some weeping  2. LOCATION: "Where is the rash located?"      Left leg  3. NUMBER: "How many spots are there?"      1 4. SIZE: "How big are the spots?" (Inches, centimeters or compare to size of a coin)      Anterior shin is red 5. ONSET: "When did the rash start?"      Unsure 6. ITCHING: "Does the rash itch?" If Yes, ask: "How bad is the itch?"  (Scale 0-10; or none, mild, moderate, severe)     No 7. PAIN: "Does the rash  hurt?" If Yes, ask: "How bad is the pain?"  (Scale 0-10; or none, mild, moderate, severe)    - NONE (0): no pain    - MILD (1-3): doesn't interfere with normal activities     - MODERATE (4-7): interferes with normal activities or awakens from sleep     - SEVERE (8-10): excruciating pain, unable to do any normal activities     No 8. OTHER SYMPTOMS: "Do you have any other symptoms?" (e.g., fever)     No  Protocols used: Rash or Redness - Localized-A-AH

## 2023-08-27 NOTE — Telephone Encounter (Signed)
 Spoke with pt and she stated that she is not able to drive due to having her second hand surgery last Thursday. Pt also stated that she does not have the capability to do a virtual visit but can do a telephone visit. Pt is needing to be seen for cellulitis of the left leg coming back. Pt was seen in April by Dr. Casimir Cleaver and was prescribed doxycyline. Pt's home health nurse came out the other day and noticed the left leg weeping, some swelling, warmth and redness. Can something be called in or would you like for me to schedule pt for a telephone visit with one of our providers.

## 2023-09-05 ENCOUNTER — Encounter: Payer: Self-pay | Admitting: Internal Medicine

## 2023-09-05 ENCOUNTER — Telehealth: Payer: Self-pay

## 2023-09-05 ENCOUNTER — Telehealth: Payer: Self-pay | Admitting: Internal Medicine

## 2023-09-05 DIAGNOSIS — L03115 Cellulitis of right lower limb: Secondary | ICD-10-CM

## 2023-09-05 MED ORDER — CEPHALEXIN 500 MG PO CAPS: 500.0000 mg | ORAL_CAPSULE | Freq: Four times a day (QID) | ORAL | 0 refills | Status: DC

## 2023-09-05 NOTE — Telephone Encounter (Signed)
 Spoke with pt and she does not have mychart so there is no way for her to take pictures and send them to us . Pt stated that she could come to the office but would not be able to get out of the car because she is unable to bear weight on her wrist still. Pt would like to know if Dr. Madelon Scheuermann would be able to come out the parking lot at her leg. Pt stated that her leg is not worse but is still weeping some.

## 2023-09-05 NOTE — Telephone Encounter (Signed)
 Copied from CRM [id number]. Topic: General - Call Back - No Documentation >> Sep 05, 2023  3:44 PM Marlan Silva wrote: Reason for CRM: Patient is calling in to see if is at Mariah Shines at the front desk, and if she is available to speak. Please give patient a call back at [phone]. Patient states that Nurse Camilo Cella told her to call back after trying to send a picture of her leg, she has no access to my chart and wants to know how she should proceed.

## 2023-09-05 NOTE — Telephone Encounter (Signed)
 Please see previous message with the original message

## 2023-09-05 NOTE — Assessment & Plan Note (Signed)
 Recurrent.  Sending [facility]in  [facility] to take [facility]

## 2023-09-05 NOTE — Telephone Encounter (Signed)
 Spoke with pt and she stated that the physical therapist set her mychart up so he could send a picture of her left leg to you. Pt stated that she does not know how to use the mychart so she will not be able to read a message if sent back to her. I advised pt that she would just have to call the pharmacy this evening or tomorrow morning to see if an antibiotic was sent in. Pt gave a verbal understanding. Pictures are in her chart in a mychart message.

## 2023-09-05 NOTE — Telephone Encounter (Signed)
 Pt is aware and gave a verbal understanding.

## 2023-09-06 ENCOUNTER — Other Ambulatory Visit: Payer: Self-pay | Admitting: Internal Medicine

## 2023-09-06 DIAGNOSIS — E118 Type 2 diabetes mellitus with unspecified complications: Secondary | ICD-10-CM

## 2023-09-10 ENCOUNTER — Other Ambulatory Visit: Payer: Self-pay | Admitting: Internal Medicine

## 2023-09-11 NOTE — Telephone Encounter (Signed)
 Copied from CRM [id number]. Topic: Clinical - Medical Advice >> Sep 11, 2023  4:45 PM [place] J wrote: Reason for CRM: Patient was prescribed Cehalexin for 7 days for her cellulitis but her cellulitis has not cleared up and is wondering if she needs more sent in or if a different course of action needs to be taken.

## 2023-09-29 ENCOUNTER — Ambulatory Visit: Payer: Self-pay | Admitting: *Deleted

## 2023-09-29 NOTE — Telephone Encounter (Signed)
 Copied from CRM [id number]. Topic: Clinical - Red Word Triage >> Sep 29, 2023  8:11 AM Franky GRADE wrote: Red Word that prompted transfer to Nurse Triage: Patient was seen on 06/30/2023 for cellulitis on her legs, she was given antibiotics; however, symptoms have not improved but gotten a bit worse, turning red and discharge.  Reason for Disposition  [1] SEVERE pain (e.g., excruciating, unable to do any normal activities) AND [2] not improved after 2 hours of pain medicine    Cellulitis on both legs.  Antibiotics not helping.  Answer Assessment - Initial Assessment Questions 1. ONSET: When did the pain start?      I have cellulitis in both legs.  It's beat red and weeping pus like fluid brown colored.   I'm hurting.   Seen on 06/30/2023 for cellulitis.   Antibiotics have not helped. It feels like my legs are cold but they are warm to the touch.    2. LOCATION: Where is the pain located?      Both legs. 3. PAIN: How bad is the pain?    (Scale 1-10; or mild, moderate, severe)   -  MILD (1-3): doesn't interfere with normal activities    -  MODERATE (4-7): interferes with normal activities (e.g., work or school) or awakens from sleep, limping    -  SEVERE (8-10): excruciating pain, unable to do any normal activities, unable to walk     Moderate 4. WORK OR EXERCISE: Has there been any recent work or exercise that involved this part of the body?      I have cellulitis in both legs.   The antibiotics have not helped. 5. CAUSE: What do you think is causing the leg pain?     Cellulitis 6. OTHER SYMPTOMS: Do you have any other symptoms? (e.g., chest pain, back pain, breathing difficulty, swelling, rash, fever, numbness, weakness)     Weeping brown fluid. 7. PREGNANCY: Is there any chance you are pregnant? When was your last menstrual period?     N/A  Protocols used: Leg Pain-A-AH FYI Only or Action Required?: Action required by provider: medication refill request.  Patient was last seen  in primary care on 06/30/2023 by Bair, Kalpana, MD. Called Nurse Triage reporting Leg Pain. Symptoms began several weeks ago. Interventions attempted: Prescription medications: Nystatin   [facility] [address]   She is out.   Appt made for 7/8 with Dr. Abbey.. Symptoms are: rapidly worsening.  Triage Disposition: See Physician Within 24 Hours  Patient/caregiver understands and will follow disposition?: Yes

## 2023-09-30 ENCOUNTER — Ambulatory Visit

## 2023-09-30 ENCOUNTER — Other Ambulatory Visit: Payer: Self-pay | Admitting: Internal Medicine

## 2023-09-30 VITALS — BP 120/60 | HR 74

## 2023-09-30 DIAGNOSIS — L03116 Cellulitis of left lower limb: Secondary | ICD-10-CM | POA: Diagnosis not present

## 2023-09-30 DIAGNOSIS — M21962 Unspecified acquired deformity of left lower leg: Secondary | ICD-10-CM

## 2023-09-30 DIAGNOSIS — M48061 Spinal stenosis, lumbar region without neurogenic claudication: Secondary | ICD-10-CM

## 2023-09-30 DIAGNOSIS — L03115 Cellulitis of right lower limb: Secondary | ICD-10-CM | POA: Diagnosis not present

## 2023-09-30 DIAGNOSIS — M130 Polyarthritis, unspecified: Secondary | ICD-10-CM

## 2023-09-30 DIAGNOSIS — Z7409 Other reduced mobility: Secondary | ICD-10-CM

## 2023-09-30 MED ORDER — DOXYCYCLINE HYCLATE 100 MG PO TABS: 100.0000 mg | ORAL_TABLET | Freq: Two times a day (BID) | ORAL | 0 refills | Status: DC

## 2023-09-30 NOTE — Progress Notes (Signed)
 Acute Office Visit  Subjective:    Patient ID: Bonnie Hinton, female    DOB: 1940/11/21, 83 y.o.   MRN: [id number]  Chief Complaint  Patient presents with   Recurrent Skin Infections    HPI Discussed the use of AI scribe software for clinical note transcription with the patient, who gave verbal consent to proceed. History of Present Illness Patient is in today for following acute concerns:   Bonnie Hinton is an 83 year old female with uncontrolled diabetes, recurrent b/l lower leg cellulitis who presents with worsening weeping wounds on her lower legs.  Over the past week, she has experienced worsening weeping wounds on her lower legs. Initially treated in April with a 10-day course of doxycycline , the condition did not fully resolve. A subsequent 7-day course of antibiotics was prescribed by her PCP on 09/05/23 (7 days course of Cephalexin ), but the condition persisted. The wounds have become red and are weeping, with open sores developing. She has been using cocoa butter and microstatin cream, but ran out of the latter. The wounds weep overnight, leaving a brown residue.  She has a history of uncontrolled diabetes, with her last A1c recorded at 8.6% in January. She uses a wheelchair at home and requires assistance with mobility when outside.   No fevers, chills, nausea, vomiting, or diarrhea. She wears compression stockings daily but has been unable to use them due to the current infection.    ROS As per HPI    Objective:    BP 120/60 (BP Location: Right Wrist, Patient Position: Sitting, Cuff Size: Large)   Pulse 74   SpO2 96%    Physical Exam Constitutional:      Appearance: She is obese.     Comments: In wheelchair   HENT:     Head: Normocephalic and atraumatic.  Cardiovascular:     Rate and Rhythm: Normal rate.  Pulmonary:     Effort: Pulmonary effort is normal.     Breath sounds: Normal breath sounds. No stridor. No rales.  Abdominal:     General:  Abdomen is protuberant.     Tenderness: There is no abdominal tenderness.  Musculoskeletal:     Cervical back: Neck supple.     Right lower leg: 3+ Pitting Edema present.     Left lower leg: 3+ Pitting Edema present.     Comments: Moderate b/l lower legs pitting edema with warm to touch, hyperpigmentation, erythema, lichenification, clear to yellow seeping discharge noted b/l. No odor. Dorsalis pedis 2+ b/l.   Neurological:     Mental Status: She is alert.     No results found for any visits on 09/30/23.     Assessment & Plan:  Assessment and Plan  Bilateral lower leg cellulitis Assessment & Plan: Bilateral lower extremity erythema, edema, and wound in patient with uncontrolled type II DM increasing risk of delayed wound healing and increase infection risk. Start doxycycline  100 mg twice daily for 10 days. Antibiotic s/e discussed, recommend taking daily probiotic for 14 days to reduce s/e. Refer to wound care clinic for evaluation and management. Schedule follow-up with PCP Dr. Marylynn within 7-10 days. Instruct to elevate legs regularly to reduce swelling. Advise to monitor for signs of systemic infection and seek immediate medical attention if she occurs. Discuss potential need for vascular evaluation, including ultrasound of leg veins, recommend f/u with PCP for this.    Orders: -     AMB referral to wound care center -  Doxycycline  Hyclate; Take 1 tablet (100 mg total) by mouth 2 (two) times daily for 10 days.  Dispense: 20 tablet; Refill: 0  I spent 40 minutes on the day of this face-to-face encounter reviewing the patient's medical history,  ongoing concerns, and reviewing the assessment and plan with the patient in detail.    Return for 1-2 weeks with PCP for lower leg cellulitis, uncontrolled diabetes .  Luke Shade, MD

## 2023-09-30 NOTE — Patient Instructions (Addendum)
 Elevate your legs above heart level for 30 minutes, 3-4 times a day, to help reduce swelling. Regular exercise to help reduce swelling.   Please take Doxycycline  100 mg twice a day for 10 days. Please take probiotic  for 14 days, take it 2 hours before or 2 hours after the antibiotic. Please take your antibiotic with tall glass of water and do not lay down for 30 min after you take antibiotic. I am also putting urgent referral to wound care clinic. If you do not hear from us  to schedule an appointment with wound clinic within this week please let us  know.   You should really follow up with Dr. Marylynn in 7-10 days to follow up on wound, diabetes.

## 2023-09-30 NOTE — Assessment & Plan Note (Addendum)
 Bilateral lower extremity erythema, edema, and wound in patient with uncontrolled type II DM increasing risk of delayed wound healing and increase infection risk. Start doxycycline  100 mg twice daily for 10 days. Antibiotic s/e discussed, recommend taking daily probiotic for 14 days to reduce s/e. Refer to wound care clinic for evaluation and management. Schedule follow-up with PCP Dr. Marylynn within 7-10 days. Instruct to elevate legs regularly to reduce swelling. Advise to monitor for signs of systemic infection and seek immediate medical attention if she occurs. Discuss potential need for vascular evaluation, including ultrasound of leg veins, recommend f/u with PCP for this.

## 2023-10-02 ENCOUNTER — Telehealth: Payer: Self-pay

## 2023-10-02 NOTE — Progress Notes (Signed)
   Telephone encounter was:  Unsuccessful.  10/02/2023 Name: Bonnie Hinton MRN: [id number] DOB: [birth year]  Unsuccessful outbound call made today to assist with:  Transportation Needs   Outreach Attempt:  1st Attempt  No answer and unable to leave a message    Jon Colt [facility]  [facility] Guide, Phone: [phone] Fax: [phone] Website: [email or web address]

## 2023-10-03 ENCOUNTER — Telehealth: Payer: Self-pay | Admitting: Internal Medicine

## 2023-10-03 NOTE — Telephone Encounter (Signed)
 Late Documentation called patient on 7/9 and discussed referral to Case management to discuss transportation opportunities for patient to get safely in and out of building for provider visit . Patient at first declined and I advised patient that it would be until her legs become stronger and able to safely to transport to wheelchair or safe to use walker. Patient agreed to talk to Case manager.

## 2023-10-06 ENCOUNTER — Telehealth: Payer: Self-pay

## 2023-10-06 NOTE — Progress Notes (Signed)
   Telephone encounter was:  Unsuccessful.  10/06/2023 Name: SHALANE FLORENDO MRN: [id number] DOB: 06/10/1940  Unsuccessful outbound call made today to assist with:  Transportation Needs   Outreach Attempt:  2nd Attempt  No answer and unable to leave a message    Jon Colt [facility]  [facility] Guide, Phone: [phone] Fax: [phone] Website: [email or web address]

## 2023-10-06 NOTE — Telephone Encounter (Signed)
 Copied from CRM [id number]. Topic: Clinical - Medical Advice >> Oct 06, 2023  3:56 PM Deaijah H wrote: Reason for CRM: Mallie Pizza PT w/ [facility] called in to advise Dr. Marylynn patient was put on antibiotics been on for an week and is not resolving the issue. Please call [phone]

## 2023-10-07 ENCOUNTER — Observation Stay
Admission: EM | Admit: 2023-10-07 | Discharge: 2023-10-10 | Disposition: A | Discharge status: Home / Self Care | Attending: Internal Medicine | Admitting: Internal Medicine

## 2023-10-07 ENCOUNTER — Other Ambulatory Visit: Payer: Self-pay

## 2023-10-07 ENCOUNTER — Telehealth: Payer: Self-pay

## 2023-10-07 DIAGNOSIS — I872 Venous insufficiency (chronic) (peripheral): Secondary | ICD-10-CM | POA: Diagnosis not present

## 2023-10-07 DIAGNOSIS — I4891 Unspecified atrial fibrillation: Secondary | ICD-10-CM | POA: Diagnosis present

## 2023-10-07 DIAGNOSIS — L0390 Cellulitis, unspecified: Secondary | ICD-10-CM | POA: Diagnosis present

## 2023-10-07 DIAGNOSIS — E785 Hyperlipidemia, unspecified: Secondary | ICD-10-CM | POA: Diagnosis not present

## 2023-10-07 DIAGNOSIS — M0579 Rheumatoid arthritis with rheumatoid factor of multiple sites without organ or systems involvement: Secondary | ICD-10-CM | POA: Insufficient documentation

## 2023-10-07 DIAGNOSIS — M069 Rheumatoid arthritis, unspecified: Secondary | ICD-10-CM | POA: Insufficient documentation

## 2023-10-07 DIAGNOSIS — L03116 Cellulitis of left lower limb: Secondary | ICD-10-CM | POA: Insufficient documentation

## 2023-10-07 DIAGNOSIS — Z91040 Latex allergy status: Secondary | ICD-10-CM | POA: Diagnosis not present

## 2023-10-07 DIAGNOSIS — L03115 Cellulitis of right lower limb: Secondary | ICD-10-CM | POA: Diagnosis not present

## 2023-10-07 DIAGNOSIS — E039 Hypothyroidism, unspecified: Secondary | ICD-10-CM | POA: Insufficient documentation

## 2023-10-07 DIAGNOSIS — I480 Paroxysmal atrial fibrillation: Secondary | ICD-10-CM | POA: Insufficient documentation

## 2023-10-07 DIAGNOSIS — E1121 Type 2 diabetes mellitus with diabetic nephropathy: Secondary | ICD-10-CM | POA: Diagnosis not present

## 2023-10-07 DIAGNOSIS — E1169 Type 2 diabetes mellitus with other specified complication: Secondary | ICD-10-CM | POA: Diagnosis present

## 2023-10-07 DIAGNOSIS — L03119 Cellulitis of unspecified part of limb: Principal | ICD-10-CM

## 2023-10-07 DIAGNOSIS — I10 Essential (primary) hypertension: Secondary | ICD-10-CM | POA: Diagnosis not present

## 2023-10-07 DIAGNOSIS — Z6841 Body Mass Index (BMI) 40.0 and over, adult: Secondary | ICD-10-CM | POA: Insufficient documentation

## 2023-10-07 DIAGNOSIS — E1165 Type 2 diabetes mellitus with hyperglycemia: Secondary | ICD-10-CM | POA: Insufficient documentation

## 2023-10-07 DIAGNOSIS — I890 Lymphedema, not elsewhere classified: Secondary | ICD-10-CM | POA: Insufficient documentation

## 2023-10-07 DIAGNOSIS — G4733 Obstructive sleep apnea (adult) (pediatric): Secondary | ICD-10-CM | POA: Insufficient documentation

## 2023-10-07 LAB — CBC
HCT: 41 % (ref 36.0–46.0)
Hemoglobin: 13.5 g/dL (ref 12.0–15.0)
MCH: 31.3 pg (ref 26.0–34.0)
MCHC: 32.9 g/dL (ref 30.0–36.0)
MCV: 94.9 fL (ref 80.0–100.0)
Platelets: 385 K/uL (ref 150–400)
RBC: 4.32 MIL/uL (ref 3.87–5.11)
RDW: 14.5 % (ref 11.5–15.5)
WBC: 10.9 K/uL — ABNORMAL HIGH (ref 4.0–10.5)
nRBC: 0 % (ref 0.0–0.2)

## 2023-10-07 LAB — COMPREHENSIVE METABOLIC PANEL WITH GFR
ALT: 12 U/L (ref 0–44)
AST: 18 U/L (ref 15–41)
Albumin: 3.5 g/dL (ref 3.5–5.0)
Alkaline Phosphatase: 73 U/L (ref 38–126)
Anion gap: 13 (ref 5–15)
BUN: 19 mg/dL (ref 8–23)
CO2: 26 mmol/L (ref 22–32)
Calcium: 8.9 mg/dL (ref 8.9–10.3)
Chloride: 99 mmol/L (ref 98–111)
Creatinine, Ser: 0.9 mg/dL (ref 0.44–1.00)
GFR, Estimated: >60 mL/min (ref >60)
Glucose, Bld: 172 mg/dL — ABNORMAL HIGH (ref 70–99)
Potassium: 3.5 mmol/L (ref 3.5–5.1)
Sodium: 138 mmol/L (ref 135–145)
Total Bilirubin: 0.7 mg/dL (ref 0.0–1.2)
Total Protein: 7.2 g/dL (ref 6.5–8.1)

## 2023-10-07 LAB — BASIC METABOLIC PANEL WITH GFR
Anion gap: 14 (ref 5–15)
BUN: 19 mg/dL (ref 8–23)
CO2: 23 mmol/L (ref 22–32)
Calcium: 9.2 mg/dL (ref 8.9–10.3)
Chloride: 98 mmol/L (ref 98–111)
Creatinine, Ser: 1.04 mg/dL — ABNORMAL HIGH (ref 0.44–1.00)
GFR, Estimated: 54 mL/min — ABNORMAL LOW (ref >60)
Glucose, Bld: 219 mg/dL — ABNORMAL HIGH (ref 70–99)
Potassium: 4.3 mmol/L (ref 3.5–5.1)
Sodium: 135 mmol/L (ref 135–145)

## 2023-10-07 LAB — LACTIC ACID, PLASMA: Lactic Acid, Venous: 1.9 mmol/L (ref 0.5–1.9)

## 2023-10-07 LAB — PROTIME-INR
INR: 1.6 — ABNORMAL HIGH (ref 0.8–1.2)
Prothrombin Time: 19.5 s — ABNORMAL HIGH (ref 11.4–15.2)

## 2023-10-07 MED ORDER — VANCOMYCIN HCL IN DEXTROSE 1-5 GM/200ML-% IV SOLN
1000.0000 mg | Freq: Once | INTRAVENOUS | Status: AC
  Administered 2023-10-07: 1000 mg via INTRAVENOUS
  Filled 2023-10-07: qty 200

## 2023-10-07 MED ORDER — ACETAMINOPHEN 500 MG PO TABS
ORAL_TABLET | ORAL | Status: AC
  Filled 2023-10-07: qty 2

## 2023-10-07 MED ORDER — LACTATED RINGERS IV SOLN: INTRAVENOUS | Status: DC

## 2023-10-07 MED ORDER — LACTATED RINGERS IV BOLUS (SEPSIS)
1000.0000 mL | Freq: Once | INTRAVENOUS | Status: AC
  Administered 2023-10-07: 1000 mL via INTRAVENOUS

## 2023-10-07 MED ORDER — ACETAMINOPHEN 500 MG PO TABS
1000.0000 mg | ORAL_TABLET | Freq: Once | ORAL | Status: AC
  Administered 2023-10-07: 1000 mg via ORAL

## 2023-10-07 MED ORDER — SODIUM CHLORIDE 0.9 % IV SOLN
2.0000 g | Freq: Once | INTRAVENOUS | Status: AC
  Administered 2023-10-07: 2 g via INTRAVENOUS
  Filled 2023-10-07: qty 20

## 2023-10-07 NOTE — Telephone Encounter (Signed)
 LMTCB

## 2023-10-07 NOTE — Telephone Encounter (Unsigned)
 Copied from CRM [id number]. Topic: General - Other >> Oct 07, 2023  2:18 PM Berneda FALCON wrote: Reason for CRM: Mallie the PT from [facility] was returning phone call to Harlene and when reading the notes to the PT, Mallie states that the patient is very upset because she did not need help from the staff, just that she needed a wheelchair, and this is all she was asking. Call was transferred to office manager to address the concern/complaint.

## 2023-10-07 NOTE — H&P (Incomplete)
 History and Physical    Patient: Bonnie Hinton FMW:[id number] DOB: 05/03/1940 DOA: 10/07/2023 DOS: the patient was seen and examined on 10/07/2023 PCP: Marylynn Verneita CROME, MD  Patient coming from: Home  Chief Complaint:  Chief Complaint  Patient presents with  . cellulitis   HPI: Bonnie Hinton is a 83 y.o. female with medical history significant of lymphedema, T2DM,  hypothyroidism, afib on Xarelto  Presenting for evaluation of worsening bilateral lower extremity edema, erythema, and weeping for 2-3 months. She has completed 3 rounds of antibitoics without improvement.  She is being followed once a month by home health nurse who has become increasingly concerned with her legs.  Today her nurse called the overseeing physician  who recommended patient come to the hospital for IV antibiotics.  Patient denies any systemic symptoms, chest pain, shortness of breath.  She reports compliance with her diuretics and Xarelto .  In the ED she was hemodynamically stable, afebrile with WBC 10.9, non-tachycardic.  She was started on empiric antibiotics and fluids and consulted hospitalist for admission.     Review of Systems: Review of Systems  Constitutional:  Negative for chills, fever and weight loss.  Respiratory:  Negative for cough and shortness of breath.   Cardiovascular:  Positive for leg swelling. Negative for chest pain, palpitations, orthopnea, claudication and PND.  Gastrointestinal:  Negative for abdominal pain, blood in stool, constipation, diarrhea, heartburn, nausea and vomiting.  Genitourinary:  Negative for dysuria, frequency and hematuria.  Musculoskeletal:  Negative for falls and joint pain.  Skin:  Positive for itching and rash.  Neurological:  Negative for tingling, focal weakness, seizures and weakness.  Psychiatric/Behavioral:  Negative for substance abuse and suicidal ideas.     Past Medical History:  Diagnosis Date  . Anxiety   . Arthritis   .  Choledocholithiasis   . Chronic back pain    stenosis and arthritis  . Community acquired pneumonia 05/29/2021  . Complication of anesthesia    anxious when she wakes up  . GERD (gastroesophageal reflux disease)    takes Pantoprazole  daily  . Glaucoma    dry angle  . History of bronchitis    30+ yrs ago  . History of MRSA infection 2004   several yrs ago  . Hyperlipidemia    takes Zetia  and Crestor  daily  . Hypertension    take Amlodipine  daily  . Hypothyroidism    takes Synthroid  daily  . Joint pain   . Joint swelling   . Neuropathy    takes Gabapentin  daily  . Pancreatitis, gallstone   . Peripheral edema    takes Furosemide  daily as needed  . Pneumonia    hx of-30 + yrs ago  . Pre-diabetes    takes Victoza  daily  . Primary localized osteoarthritis of right knee   . Subdural hematoma (HCC) 8 yrs ago   hx of  . Urinary frequency   . Urinary urgency    Past Surgical History:  Procedure Laterality Date  . ABDOMINAL HYSTERECTOMY    . CHOLECYSTECTOMY N/A 09/09/2016   Procedure: LAPAROSCOPIC CHOLECYSTECTOMY;  Surgeon: Belinda Cough, MD;  Location: [facility] OR;  Service: General;  Laterality: N/A;  . COLONOSCOPY    . RADIOLOGY WITH ANESTHESIA N/A 08/22/2015   Procedure: MRI OF LUMBAR SPINE   (RADIOLOGY WITH ANESTHESIA);  Surgeon: Medication Radiologist, MD;  Location: [facility] OR;  Service: Radiology;  Laterality: N/A;  . TOTAL HIP ARTHROPLASTY Right   . TOTAL HIP ARTHROPLASTY Left   . TOTAL KNEE ARTHROPLASTY  Left   . TOTAL KNEE ARTHROPLASTY Right 01/01/2016   Procedure: TOTAL KNEE ARTHROPLASTY;  Surgeon: Lamar Millman, MD;  Location: [facility] OR;  Service: Orthopedics;  Laterality: Right;   Social History:  reports that she has never smoked. She has never used smokeless tobacco. She reports that she does not drink alcohol and does not use drugs.  Allergies  Allergen Reactions  . Erythromycin Rash  . Fentanyl  Other (See Comments)    Duragesic -25 Duragesic -25  . Iodinated Contrast Media  Swelling and Rash  . Atorvastatin      stomach problems  . Metformin  And Related Other (See Comments)    Diarrhea even w/ XR metformin   . Victoza  [Liraglutide ]     Suspected to be the cause of gallstone pancreatitis June 2018  . Latex Itching and Rash  . Lisinopril Other (See Comments)    Hyperkalemia   . Other Rash    Paper tape, possibly adhesive tape PAPER TAPE     Family History  Problem Relation Age of Onset  . Cancer Father   . Diabetes Maternal Aunt   . Cancer Brother   . Breast cancer Neg Hx     Prior to Admission medications   Medication Sig Start Date End Date Taking? Authorizing Provider  acetaminophen  (TYLENOL ) 325 MG tablet Take 2 tablets (650 mg total) by mouth every 6 (six) hours as needed for mild pain (or Fever >/= 101). 01/04/16   Shepperson, Kirstin, PA-C  amLODipine  (NORVASC ) 10 MG tablet Take 1 tablet (10 mg total) by mouth daily. 04/24/23   Marylynn Verneita CROME, MD  cephALEXin  (KEFLEX ) 500 MG capsule Take 1 capsule (500 mg total) by mouth 4 (four) times daily. 09/05/23   Marylynn Verneita CROME, MD  cetirizine  (ZYRTEC ) 10 MG tablet Take 1 tablet (10 mg total) by mouth daily. Patient not taking: Reported on 09/30/2023 07/31/22   Tullo, Teresa L, MD  Cholecalciferol (VITAMIN D3) 2000 units TABS Take 1 tablet by mouth daily.    [provider]  Continuous Glucose Sensor (FREESTYLE LIBRE 2 SENSOR) MISC USE AS DIRECTED. CHANGE SENSOR EVERY 14 DAYS 08/08/23   Marylynn Verneita CROME, MD  cyanocobalamin  (VITAMIN B12) 1000 MCG/ML injection Inject 1 mL (1,000 mcg total) into the muscle every 30 (thirty) days. 01/06/23   Marylynn Verneita CROME, MD  cycloSPORINE  (RESTASIS ) 0.05 % ophthalmic emulsion Place 1 drop into both eyes 2 (two) times daily.    [provider]  dicyclomine  (BENTYL ) 20 MG tablet TAKE ONE TABLET BY MOUTH FOUR TIMES A DAY, BEFORE MEALS AND AT BEDTIME 05/19/23   Marylynn Verneita CROME, MD  doxycycline  (VIBRA -TABS) 100 MG tablet Take 1 tablet (100 mg total) by mouth 2 (two)  times daily for 10 days. 09/30/23 10/10/23  Bair, Kalpana, MD  ezetimibe  (ZETIA ) 10 MG tablet Take 1 tablet (10 mg total) by mouth daily. 04/24/23   Marylynn Verneita CROME, MD  folic acid  (FOLVITE ) 1 MG tablet Take 1 mg by mouth daily.    [provider]  furosemide  (LASIX ) 20 MG tablet TAKE ONE TABLET BY MOUTH EVERY DAY 06/02/23   Marylynn Verneita CROME, MD  hydroxychloroquine  (PLAQUENIL ) 200 MG tablet Take 200 mg by mouth 2 (two) times daily.    [provider]  LANTUS  SOLOSTAR 100 UNIT/ML Solostar Pen INJECT 25 UNITS UNDER THE SKIN DAILY AS DIRECTED *INCREASE AS DIRECTED BY PHYSICIAN* 09/08/23   Marylynn Verneita CROME, MD  methotrexate  (RHEUMATREX) 2.5 MG tablet Take 5 tablets by mouth once a week.    [provider]  metoprolol  tartrate (LOPRESSOR ) 50 MG tablet TAKE ONE TABLET BY MOUTH TWICE A DAY 09/10/23   Marylynn Verneita CROME, MD  mupirocin  ointment (BACTROBAN ) 2 % APPLY TO AFFECTED AREA TWICE A DAY 11/18/22   Marylynn Verneita CROME, MD  nystatin  cream (MYCOSTATIN ) APPLY TO THE AFFECTED AREA(S) TWICE DAILY 11/07/21   Marylynn Verneita CROME, MD  pantoprazole  (PROTONIX ) 40 MG tablet Take 1 tablet (40 mg total) by mouth daily. 04/24/23   Marylynn Verneita CROME, MD  spironolactone  (ALDACTONE ) 50 MG tablet TAKE ONE TABLET BY MOUTH EVERY DAY 06/03/23   Marylynn Verneita CROME, MD  SYNTHROID  150 MCG tablet TAKE ONE TABLET BY MOUTH EVERY DAY 06/02/23   Marylynn Verneita CROME, MD  tiZANidine  (ZANAFLEX ) 4 MG tablet TAKE 1 TABLET BY MOUTH EVERY 6 HOURS AS NEEDED FOR MUSCLE SPASMS 03/13/23   Marylynn Verneita CROME, MD  trospium  (SANCTURA ) 20 MG tablet TAKE ONE TABLET BY MOUTH TWICE A DAY 04/16/23   Marylynn Verneita CROME, MD  XARELTO  20 MG TABS tablet TAKE ONE TABLET BY MOUTH EVERY DAY WITH SUPPER 06/29/23   Marylynn Verneita CROME, MD    Physical Exam: Vitals:   10/07/23 1755 10/07/23 1757  BP:  (!) 157/69  Pulse:  93  Resp:  18  Temp:  98.1 F (36.7 C)  SpO2:  95%  Weight: (!) 149.7 kg   Height: 5' 8.5 (1.74 m)    Physical Exam Vitals reviewed.   Constitutional:      General: She is not in acute distress.    Appearance: She is obese. She is not ill-appearing, toxic-appearing or diaphoretic.  HENT:     Mouth/Throat:     Mouth: Mucous membranes are moist.  Eyes:     Extraocular Movements: Extraocular movements intact.     Pupils: Pupils are equal, round, and reactive to light.  Cardiovascular:     Rate and Rhythm: Normal rate and regular rhythm.  Pulmonary:     Effort: Pulmonary effort is normal. No respiratory distress.     Breath sounds: Normal breath sounds. No wheezing or rales.  Abdominal:     General: Abdomen is flat. There is no distension.     Palpations: Abdomen is soft.     Tenderness: There is no abdominal tenderness.  Musculoskeletal:        General: Normal range of motion.     Cervical back: Normal range of motion and neck supple.     Right lower leg: Edema present.     Left lower leg: Edema present.  Skin:    General: Skin is warm and dry.  Neurological:     Mental Status: She is alert.     Data Reviewed: {Tip this will not be part of the note when signed- Document your independent interpretation of telemetry tracing, EKG, lab, Radiology test or any other diagnostic tests. Add any new diagnostic test ordered today. (Optional):26781} {Results:26384}  Assessment and Plan: No notes have been filed under this hospital service. Service: Hospitalist  83 y.o. female with medical history significant of lymphedema, T2DM,  hypothyroidism, afib on Xarelto  with worsening lower extremity edema and erythema which has not responded to outpatient antibiotics, now limiting patient's mobility  B/l LE edema - will empirically treat with IV abx for now. Not septic.  Suspicion higher for worsening Lymphedema vs venous stasis dermatitis/venous insufficiency   at this time.  Echo from 1 year ago unremarkable. - IV diuresis  -   T2DM  - SSI   Afib -  cont bb and xarelto    Hypothyroidism  - cont LT4    Xarelto  Consistent carb diet  No IVF    Advance Care Planning:   Code Status: Prior Discussed with patient at time of admission   Consults: ***  Family Communication: ***  Severity of Illness: {Observation/Inpatient:21159}  Author: Daved JAYSON Pump, DO 10/07/2023 10:40 PM  For on call review [email or web address].

## 2023-10-07 NOTE — ED Provider Notes (Signed)
 [facility] Provider Note   Event Date/Time   First MD Initiated Contact with Patient 10/07/23 2040     (approximate) History  cellulitis  HPI Bonnie Hinton is a 83 y.o. female with past medical history of morbid obesity, type 2 diabetes, CVA, hyperlipidemia, and lymphedema bilateral lower extremities who presents complaining of continued erythema, swelling, and significant pain to bilateral anterior legs.  Patient states that she has been on 3 different antibiotic regimens since April that have not improved the symptoms.  Patient now endorses subjective fever with chills.  Patient also endorses spreading redness up her legs over the last few weeks despite being on doxycycline  in the outpatient setting ROS: Patient currently denies any vision changes, tinnitus, difficulty speaking, facial droop, sore throat, chest pain, shortness of breath, abdominal pain, nausea/vomiting/diarrhea, dysuria, or weakness/numbness/paresthesias in any extremity   Physical Exam  Triage Vital Signs: ED Triage Vitals  Encounter Vitals Group     BP 10/07/23 1757 (!) 157/69     Girls Systolic BP Percentile --      Girls Diastolic BP Percentile --      Boys Systolic BP Percentile --      Boys Diastolic BP Percentile --      Pulse Rate 10/07/23 1757 93     Resp 10/07/23 1757 18     Temp 10/07/23 1757 98.1 F (36.7 C)     Temp src --      SpO2 10/07/23 1757 95 %     Weight 10/07/23 1755 (!) 330 lb (149.7 kg)     Height 10/07/23 1755 5' 8.5 (1.74 m)     Head Circumference --      Peak Flow --      Pain Score 10/07/23 1755 0     Pain Loc --      Pain Education --      Exclude from Growth Chart --    Most recent vital signs: Vitals:   10/07/23 1757  BP: (!) 157/69  Pulse: 93  Resp: 18  Temp: 98.1 F (36.7 C)  SpO2: 95%   General: Awake, oriented x4. CV:  Good peripheral perfusion. Resp:  Normal effort. Abd:  No distention. Other:  Elderly morbidly obese Caucasian  female resting comfortably in no acute distress.  Lymphedema to bilateral lower extremities with anterior erythema sparing the backside of the legs from ankle to knee ED Results / Procedures / Treatments  Labs (all labs ordered are listed, but only abnormal results are displayed) Labs Reviewed  CBC - Abnormal; Notable for the following components:      Result Value   WBC 10.9 (*)    All other components within normal limits  BASIC METABOLIC PANEL WITH GFR - Abnormal; Notable for the following components:   Glucose, Bld 219 (*)    Creatinine, Ser 1.04 (*)    GFR, Estimated 54 (*)    All other components within normal limits  CULTURE, BLOOD (ROUTINE X 2)  CULTURE, BLOOD (ROUTINE X 2)  LACTIC ACID, PLASMA  COMPREHENSIVE METABOLIC PANEL WITH GFR  PROTIME-INR   EKG ED ECG REPORT I, Artist MARLA Kerns, the attending physician, personally viewed and interpreted this ECG. Date: 10/07/2023 EKG Time: 2242 Rate: 96 Rhythm: normal sinus rhythm QRS Axis: normal Intervals: normal ST/T Wave abnormalities: normal Narrative Interpretation: no evidence of acute ischemia PROCEDURES: Critical Care performed: No Procedures MEDICATIONS ORDERED IN ED: Medications  lactated ringers  infusion (has no administration in time range)  lactated ringers   bolus 1,000 mL (has no administration in time range)  cefTRIAXone  (ROCEPHIN ) 2 g in sodium chloride  0.9 % 100 mL IVPB (has no administration in time range)  vancomycin  (VANCOCIN ) IVPB 1000 mg/200 mL premix (has no administration in time range)  acetaminophen  (TYLENOL ) 500 MG tablet (has no administration in time range)  acetaminophen  (TYLENOL ) tablet 1,000 mg (1,000 mg Oral Given 10/07/23 2223)   IMPRESSION / MDM / ASSESSMENT AND PLAN / ED COURSE  I reviewed the triage vital signs and the nursing notes.                             The patient is on the cardiac monitor to evaluate for evidence of arrhythmia and/or significant heart rate changes. Patient's  presentation is most consistent with acute presentation with potential threat to life or bodily function. The Pt presents with bilateral lower extremity erythema highly concerning for sepsis (suspected skin and soft tissue source). At this time, the Pt is satting well on room air, normotensive, and appears HDS.  Will start empiric antibiotics and fluids.  Due to normotension, will administer fluids gradually with frequent reassessment. Have low suspicion for a GI, skin/soft tissue, or CNS source at this time, but will reconsider if initial workup is unremarkable.  - CBC, BMP, LFTs - UA - BCx x2, Lactate - EKG - Empiric Abx: Rocephin , vancomycin  - Fluids: 1 L LR  Consults: Hospitalist, agrees with plan for admission for failure of outpatient antibiotics  Dispo: Admit to medicine   FINAL CLINICAL IMPRESSION(S) / ED DIAGNOSES   Final diagnoses:  Cellulitis of lower extremity, unspecified laterality   Rx / DC Orders   ED Discharge Orders     None      Note:  This document was prepared using Dragon voice recognition software and may include unintentional dictation errors.   Leiloni Smithers K, MD 10/07/23 [phone]

## 2023-10-07 NOTE — H&P (Signed)
 History and Physical    Patient: Bonnie Hinton FMW:[id number] DOB: 02-03-1941 DOA: 10/07/2023 DOS: the patient was seen and examined on 10/07/2023 PCP: Marylynn Verneita CROME, MD  Patient coming from: {Point_of_Origin:[address]}  Chief Complaint:  Chief Complaint  Patient presents with   cellulitis   HPI: Bonnie Hinton is a 83 y.o. female with medical history significant of lymphedema, T2DM,  hypothyroidism, afib Presenting for evaluation of worsening bilateral lower extremity edema and erythema since for 2-3 months. She has completed 3 rounds of antibitoics without relief.     In the ED she was hemodynamically stable, afebrile with WBC 10.9. She was started  Review of Systems: {ROS_Text:26778} Past Medical History:  Diagnosis Date   Anxiety    Arthritis    Choledocholithiasis    Chronic back pain    stenosis and arthritis   Community acquired pneumonia 05/29/2021   Complication of anesthesia    anxious when she wakes up   GERD (gastroesophageal reflux disease)    takes Pantoprazole  daily   Glaucoma    dry angle   History of bronchitis    30+ yrs ago   History of MRSA infection 2004   several yrs ago   Hyperlipidemia    takes Zetia  and Crestor  daily   Hypertension    take Amlodipine  daily   Hypothyroidism    takes Synthroid  daily   Joint pain    Joint swelling    Neuropathy    takes Gabapentin  daily   Pancreatitis, gallstone    Peripheral edema    takes Furosemide  daily as needed   Pneumonia    hx of-30 + yrs ago   Pre-diabetes    takes Victoza  daily   Primary localized osteoarthritis of right knee    Subdural hematoma (HCC) 8 yrs ago   hx of   Urinary frequency    Urinary urgency    Past Surgical History:  Procedure Laterality Date   ABDOMINAL HYSTERECTOMY     CHOLECYSTECTOMY N/A 09/09/2016   Procedure: LAPAROSCOPIC CHOLECYSTECTOMY;  Surgeon: Belinda Cough, MD;  Location: [facility] OR;  Service: General;  Laterality: N/A;   COLONOSCOPY     RADIOLOGY WITH  ANESTHESIA N/A 08/22/2015   Procedure: MRI OF LUMBAR SPINE   (RADIOLOGY WITH ANESTHESIA);  Surgeon: Medication Radiologist, MD;  Location: [facility] OR;  Service: Radiology;  Laterality: N/A;   TOTAL HIP ARTHROPLASTY Right    TOTAL HIP ARTHROPLASTY Left    TOTAL KNEE ARTHROPLASTY Left    TOTAL KNEE ARTHROPLASTY Right 01/01/2016   Procedure: TOTAL KNEE ARTHROPLASTY;  Surgeon: Lamar Millman, MD;  Location: [facility] OR;  Service: Orthopedics;  Laterality: Right;   Social History:  reports that she has never smoked. She has never used smokeless tobacco. She reports that she does not drink alcohol and does not use drugs.  Allergies  Allergen Reactions   Erythromycin Rash   Fentanyl  Other (See Comments)    Duragesic -25 Duragesic -25   Iodinated Contrast Media Swelling and Rash   Atorvastatin      stomach problems   Metformin  And Related Other (See Comments)    Diarrhea even w/ XR metformin    Victoza  [Liraglutide ]     Suspected to be the cause of gallstone pancreatitis June 2018   Latex Itching and Rash   Lisinopril Other (See Comments)    Hyperkalemia    Other Rash    Paper tape, possibly adhesive tape PAPER TAPE     Family History  Problem Relation Age of Onset   Cancer Father  Diabetes Maternal Aunt    Cancer Brother    Breast cancer Neg Hx     Prior to Admission medications   Medication Sig Start Date End Date Taking? Authorizing Provider  acetaminophen  (TYLENOL ) 325 MG tablet Take 2 tablets (650 mg total) by mouth every 6 (six) hours as needed for mild pain (or Fever >/= 101). 01/04/16   Shepperson, Kirstin, PA-C  amLODipine  (NORVASC ) 10 MG tablet Take 1 tablet (10 mg total) by mouth daily. 04/24/23   Marylynn Verneita CROME, MD  cephALEXin  (KEFLEX ) 500 MG capsule Take 1 capsule (500 mg total) by mouth 4 (four) times daily. 09/05/23   Marylynn Verneita CROME, MD  cetirizine  (ZYRTEC ) 10 MG tablet Take 1 tablet (10 mg total) by mouth daily. Patient not taking: Reported on 09/30/2023 07/31/22   Tullo, Teresa  L, MD  Cholecalciferol (VITAMIN D3) 2000 units TABS Take 1 tablet by mouth daily.    [provider]  Continuous Glucose Sensor (FREESTYLE LIBRE 2 SENSOR) MISC USE AS DIRECTED. CHANGE SENSOR EVERY 14 DAYS 08/08/23   Marylynn Verneita CROME, MD  cyanocobalamin  (VITAMIN B12) 1000 MCG/ML injection Inject 1 mL (1,000 mcg total) into the muscle every 30 (thirty) days. 01/06/23   Marylynn Verneita CROME, MD  cycloSPORINE  (RESTASIS ) 0.05 % ophthalmic emulsion Place 1 drop into both eyes 2 (two) times daily.    [provider]  dicyclomine  (BENTYL ) 20 MG tablet TAKE ONE TABLET BY MOUTH FOUR TIMES A DAY, BEFORE MEALS AND AT BEDTIME 05/19/23   Marylynn Verneita CROME, MD  doxycycline  (VIBRA -TABS) 100 MG tablet Take 1 tablet (100 mg total) by mouth 2 (two) times daily for 10 days. 09/30/23 10/10/23  Bair, Kalpana, MD  ezetimibe  (ZETIA ) 10 MG tablet Take 1 tablet (10 mg total) by mouth daily. 04/24/23   Marylynn Verneita CROME, MD  folic acid  (FOLVITE ) 1 MG tablet Take 1 mg by mouth daily.    [provider]  furosemide  (LASIX ) 20 MG tablet TAKE ONE TABLET BY MOUTH EVERY DAY 06/02/23   Marylynn Verneita CROME, MD  hydroxychloroquine  (PLAQUENIL ) 200 MG tablet Take 200 mg by mouth 2 (two) times daily.    [provider]  LANTUS  SOLOSTAR 100 UNIT/ML Solostar Pen INJECT 25 UNITS UNDER THE SKIN DAILY AS DIRECTED *INCREASE AS DIRECTED BY PHYSICIAN* 09/08/23   Marylynn Verneita CROME, MD  methotrexate  (RHEUMATREX) 2.5 MG tablet Take 5 tablets by mouth once a week.    [provider]  metoprolol  tartrate (LOPRESSOR ) 50 MG tablet TAKE ONE TABLET BY MOUTH TWICE A DAY 09/10/23   Marylynn Verneita CROME, MD  mupirocin  ointment (BACTROBAN ) 2 % APPLY TO AFFECTED AREA TWICE A DAY 11/18/22   Marylynn Verneita CROME, MD  nystatin  cream (MYCOSTATIN ) APPLY TO THE AFFECTED AREA(S) TWICE DAILY 11/07/21   Marylynn Verneita CROME, MD  pantoprazole  (PROTONIX ) 40 MG tablet Take 1 tablet (40 mg total) by mouth daily. 04/24/23   Marylynn Verneita CROME, MD  spironolactone  (ALDACTONE )  50 MG tablet TAKE ONE TABLET BY MOUTH EVERY DAY 06/03/23   Tullo, Teresa L, MD  SYNTHROID  150 MCG tablet TAKE ONE TABLET BY MOUTH EVERY DAY 06/02/23   Marylynn Verneita CROME, MD  tiZANidine  (ZANAFLEX ) 4 MG tablet TAKE 1 TABLET BY MOUTH EVERY 6 HOURS AS NEEDED FOR MUSCLE SPASMS 03/13/23   Marylynn Verneita CROME, MD  trospium  (SANCTURA ) 20 MG tablet TAKE ONE TABLET BY MOUTH TWICE A DAY 04/16/23   Marylynn Verneita CROME, MD  XARELTO  20 MG TABS tablet TAKE ONE TABLET BY MOUTH  EVERY DAY WITH SUPPER 06/29/23   Marylynn Verneita CROME, MD    Physical Exam: Vitals:   10/07/23 1755 10/07/23 1757  BP:  (!) 157/69  Pulse:  93  Resp:  18  Temp:  98.1 F (36.7 C)  SpO2:  95%  Weight: (!) 149.7 kg   Height: 5' 8.5 (1.74 m)    *** Data Reviewed: {Tip this will not be part of the note when signed- Document your independent interpretation of telemetry tracing, EKG, lab, Radiology test or any other diagnostic tests. Add any new diagnostic test ordered today. (Optional):26781} {Results:26384}  Assessment and Plan: No notes have been filed under this hospital service. Service: Hospitalist  B/l LE cellulitis - will empirically treat with IV abx for now. Not septic. Ddx includes worsening Lymphedema, venous insufficiency etc  - IV diuresis  - b/l LE doppler    T2DM  - SSI   Afib - cont bb and xarelto    Hypothyroidism  - cont LT4   Xarelto  Consistent carb diet  No IVF    Advance Care Planning:   Code Status: Prior Discussed with patient at time of admission   Consults: ***  Family Communication: ***  Severity of Illness: {Observation/Inpatient:21159}  Author: Daved JAYSON Pump, DO 10/07/2023 10:40 PM  For on call review [email or web address].

## 2023-10-07 NOTE — Consult Note (Incomplete)
 CODE SEPSIS - PHARMACY COMMUNICATION  **Broad-spectrum antimicrobials should be administered within one hour of sepsis diagnosis**  Time Code Sepsis call or page was received: 2118  Antibiotics ordered: Ceftriaxone , Vancomycin   Time of first antibiotic administration: ***  Additional action taken by pharmacy: ***  If necessary, name of provider/nurse contacted: ***    Will M. Lenon, PharmD Clinical Pharmacist 10/07/2023 9:30 PM

## 2023-10-07 NOTE — Progress Notes (Signed)
   Telephone encounter was:  Unsuccessful.  10/07/2023 Name: Bonnie Hinton MRN: [id number] DOB: Apr 27, 1940  Unsuccessful outbound call made today to assist with:  Transportation Needs   Outreach Attempt:  3rd Attempt.  Referral closed unable to contact patient.  A HIPAA compliant voice message was left requesting a return call.  Instructed patient to call back   [facility] Guide, Phone: [phone] Fax: [phone] Website: [email or web address]

## 2023-10-07 NOTE — Telephone Encounter (Signed)
 Spoke with Bonnie Hinton from [facility] advised we would call patient to discuss cellulitis and need to go to the hospital. Called patient and advised that Dr. Marylynn had seen pictures of legs taken  today and sent to office by secure email, and that provider advised if legs no better on doxycycline  that patient need to go to hospital to be evaluated and treated with possible hospitalization and I V antibiotics. Patient stated she did not want transportation, due to losing her independence I advised we only wanted to help until patients legs healed and feeling better patient still declined. I advised patient again with CMA in room as witness after asking patient okay to have on speaker phone patient stated okay. I advised that if legs continue to get worse infection could spread to bloodstream and cause sepsis that patient needs to be evaluated and treated to prevent this from happening. Patient stated she cannot go due to could not leave her house . I advised again that patient needs to go because this could be very serious and patient staed she would need to call daughter in [place] and ask if I could call back at 4 PM.

## 2023-10-07 NOTE — Telephone Encounter (Signed)
 LMTCB with Bonnie Hinton please relay message to her when she returns the call.

## 2023-10-07 NOTE — Sepsis Progress Note (Signed)
 Elink monitoring for the code sepsis protocol.

## 2023-10-07 NOTE — ED Triage Notes (Signed)
 Pt comes with c/o cellulitis to bilateral legs. Pt states this started back in May and has progressively got worse. Pt has been on 3 rounds of antibiotics. Pt states currently on last round of meds with no improvement.  Pt has severe pitting edema, redness swelling and warmth to legs.

## 2023-10-07 NOTE — Telephone Encounter (Signed)
 Return ed call to patient at 4:30 patient agreed for me to call  EMS to transport to ED. Called EMS and advised patient has bilateral cellulitis worsening on Doxycycline  100 mg twice daily prescribed 7 days ago , and that provider has stated patient needs ED evaluation and that patient is wheelchair bound at this time due to cellulitis. [facility] if needs to call patient to please wait for answering machine and identify themselves. Also advised of cat in home.

## 2023-10-07 NOTE — Telephone Encounter (Signed)
 Per Mallie Pizza from [facility] stated that pt's legs are not getting any better after being on the doxycyline for about a week. Pt will complete the doxycycline  on Thursday. Mallie stated that both legs are still swollen, red all the way put to the knees, draining a lot, painful and itchy. She stated that she is worried about the pt because the legs are so painful they are weak and she is having trouble getting around. Mallie is going to email pictures of her legs. Once received will route to Dr. Tullo for review.

## 2023-10-07 NOTE — Progress Notes (Signed)
 CODE SEPSIS - PHARMACY COMMUNICATION  **Broad Spectrum Antibiotics should be administered within 1 hour of Sepsis diagnosis**  Time Code Sepsis Called/Page Received: 7/15 @ 2118   Antibiotics Ordered: Vanc, Cefriaxone   Time of 1st antibiotic administration: Ceftriaxone  2 gm IV X 1 on 7/15 @ 2245  Additional action taken by pharmacy: RPh messaged RN , pt have difficulty with IV access   If necessary, Name of Provider/Nurse Contacted:     Edmon Magid D ,PharmD Clinical Pharmacist  10/07/2023  11:14 PM

## 2023-10-07 NOTE — Telephone Encounter (Signed)
 Return call to patient as promised to see if patient will go to ED as discussed earlier as provider recommended.patient has spoken with daughter and has agreed to go to ED as discussed patient ask that I wait until 4:30 to call EMS to get patient to the hospital. I advised I would call back to see if ready and call EMS patient ask could I wait longer if needed advised patient that I  would do all I can to comply with patient needs.Patient stated she is wiling to go daughter has advised same to go to ED.

## 2023-10-08 DIAGNOSIS — I890 Lymphedema, not elsewhere classified: Secondary | ICD-10-CM

## 2023-10-08 LAB — CBG MONITORING, ED
Glucose-Capillary: 181 mg/dL — ABNORMAL HIGH (ref 70–99)
Glucose-Capillary: 208 mg/dL — ABNORMAL HIGH (ref 70–99)
Glucose-Capillary: 218 mg/dL — ABNORMAL HIGH (ref 70–99)

## 2023-10-08 LAB — BASIC METABOLIC PANEL WITH GFR
Anion gap: 11 (ref 5–15)
BUN: 15 mg/dL (ref 8–23)
CO2: 25 mmol/L (ref 22–32)
Calcium: 8.4 mg/dL — ABNORMAL LOW (ref 8.9–10.3)
Chloride: 102 mmol/L (ref 98–111)
Creatinine, Ser: 0.78 mg/dL (ref 0.44–1.00)
GFR, Estimated: >60 mL/min (ref >60)
Glucose, Bld: 174 mg/dL — ABNORMAL HIGH (ref 70–99)
Potassium: 3.3 mmol/L — ABNORMAL LOW (ref 3.5–5.1)
Sodium: 138 mmol/L (ref 135–145)

## 2023-10-08 LAB — GLUCOSE, CAPILLARY: Glucose-Capillary: 193 mg/dL — ABNORMAL HIGH (ref 70–99)

## 2023-10-08 LAB — CBC
HCT: 36.4 % (ref 36.0–46.0)
Hemoglobin: 11.7 g/dL — ABNORMAL LOW (ref 12.0–15.0)
MCH: 30.3 pg (ref 26.0–34.0)
MCHC: 32.1 g/dL (ref 30.0–36.0)
MCV: 94.3 fL (ref 80.0–100.0)
Platelets: 349 K/uL (ref 150–400)
RBC: 3.86 MIL/uL — ABNORMAL LOW (ref 3.87–5.11)
RDW: 14.4 % (ref 11.5–15.5)
WBC: 8.4 K/uL (ref 4.0–10.5)
nRBC: 0 % (ref 0.0–0.2)

## 2023-10-08 LAB — C-REACTIVE PROTEIN: CRP: 2.4 mg/dL — ABNORMAL HIGH (ref <1.0)

## 2023-10-08 LAB — PROCALCITONIN: Procalcitonin: <0.1 ng/mL

## 2023-10-08 MED ORDER — ONDANSETRON HCL 4 MG PO TABS: 4.0000 mg | ORAL_TABLET | Freq: Four times a day (QID) | ORAL | Status: DC | PRN

## 2023-10-08 MED ORDER — ACETAMINOPHEN 325 MG PO TABS
650.0000 mg | ORAL_TABLET | Freq: Four times a day (QID) | ORAL | Status: DC | PRN
  Administered 2023-10-08 – 2023-10-10 (×7): 650 mg via ORAL
  Filled 2023-10-08 (×7): qty 2

## 2023-10-08 MED ORDER — ACETAMINOPHEN 650 MG RE SUPP: 650.0000 mg | Freq: Four times a day (QID) | RECTAL | Status: DC | PRN

## 2023-10-08 MED ORDER — NYSTATIN 100000 UNIT/GM EX POWD
Freq: Two times a day (BID) | CUTANEOUS | Status: DC
  Filled 2023-10-08 (×2): qty 15

## 2023-10-08 MED ORDER — CYCLOSPORINE 0.05 % OP EMUL
1.0000 [drp] | Freq: Two times a day (BID) | OPHTHALMIC | Status: DC
  Administered 2023-10-08 – 2023-10-10 (×4): 1 [drp] via OPHTHALMIC
  Filled 2023-10-08 (×5): qty 30

## 2023-10-08 MED ORDER — INSULIN GLARGINE-YFGN 100 UNIT/ML ~~LOC~~ SOLN
20.0000 [IU] | Freq: Every day | SUBCUTANEOUS | Status: DC
  Administered 2023-10-08 – 2023-10-09 (×3): 20 [IU] via SUBCUTANEOUS
  Filled 2023-10-08 (×4): qty 0.2

## 2023-10-08 MED ORDER — METHOTREXATE SODIUM 2.5 MG PO TABS: 12.5000 mg | ORAL_TABLET | ORAL | Status: DC

## 2023-10-08 MED ORDER — PANTOPRAZOLE SODIUM 40 MG PO TBEC
40.0000 mg | DELAYED_RELEASE_TABLET | Freq: Every day | ORAL | Status: DC
  Administered 2023-10-08 – 2023-10-10 (×3): 40 mg via ORAL
  Filled 2023-10-08 (×3): qty 1

## 2023-10-08 MED ORDER — METHOTREXATE SODIUM 2.5 MG PO TABS
12.5000 mg | ORAL_TABLET | ORAL | Status: DC
  Filled 2023-10-08: qty 5

## 2023-10-08 MED ORDER — ONDANSETRON HCL 4 MG/2ML IJ SOLN: 4.0000 mg | Freq: Four times a day (QID) | INTRAMUSCULAR | Status: DC | PRN

## 2023-10-08 MED ORDER — HYDROXYCHLOROQUINE SULFATE 200 MG PO TABS
200.0000 mg | ORAL_TABLET | Freq: Two times a day (BID) | ORAL | Status: DC
  Administered 2023-10-08 – 2023-10-10 (×5): 200 mg via ORAL
  Filled 2023-10-08 (×5): qty 1

## 2023-10-08 MED ORDER — AMLODIPINE BESYLATE 10 MG PO TABS
10.0000 mg | ORAL_TABLET | Freq: Every day | ORAL | Status: DC
  Administered 2023-10-08 – 2023-10-10 (×3): 10 mg via ORAL
  Filled 2023-10-08: qty 1
  Filled 2023-10-08: qty 2
  Filled 2023-10-08: qty 1

## 2023-10-08 MED ORDER — TIZANIDINE HCL 4 MG PO TABS
4.0000 mg | ORAL_TABLET | Freq: Four times a day (QID) | ORAL | Status: DC | PRN
  Administered 2023-10-08 – 2023-10-10 (×4): 4 mg via ORAL
  Filled 2023-10-08 (×4): qty 1
  Filled 2023-10-08: qty 2
  Filled 2023-10-08: qty 1

## 2023-10-08 MED ORDER — INSULIN ASPART 100 UNIT/ML IJ SOLN
0.0000 [IU] | Freq: Three times a day (TID) | INTRAMUSCULAR | Status: DC
  Administered 2023-10-08 (×2): 5 [IU] via SUBCUTANEOUS
  Administered 2023-10-08: 3 [IU] via SUBCUTANEOUS
  Administered 2023-10-09: 8 [IU] via SUBCUTANEOUS
  Administered 2023-10-09 – 2023-10-10 (×3): 3 [IU] via SUBCUTANEOUS
  Administered 2023-10-10: 5 [IU] via SUBCUTANEOUS
  Filled 2023-10-08 (×8): qty 1

## 2023-10-08 MED ORDER — EZETIMIBE 10 MG PO TABS
10.0000 mg | ORAL_TABLET | Freq: Every day | ORAL | Status: DC
  Administered 2023-10-08 – 2023-10-10 (×3): 10 mg via ORAL
  Filled 2023-10-08 (×4): qty 1

## 2023-10-08 MED ORDER — METOPROLOL TARTRATE 50 MG PO TABS
50.0000 mg | ORAL_TABLET | Freq: Two times a day (BID) | ORAL | Status: DC
  Administered 2023-10-08 – 2023-10-10 (×5): 50 mg via ORAL
  Filled 2023-10-08 (×3): qty 1
  Filled 2023-10-08: qty 2
  Filled 2023-10-08: qty 1

## 2023-10-08 MED ORDER — RIVAROXABAN 20 MG PO TABS
20.0000 mg | ORAL_TABLET | Freq: Every day | ORAL | Status: DC
  Administered 2023-10-08 – 2023-10-09 (×3): 20 mg via ORAL
  Filled 2023-10-08 (×5): qty 1

## 2023-10-08 MED ORDER — INSULIN ASPART 100 UNIT/ML IJ SOLN
0.0000 [IU] | Freq: Every day | INTRAMUSCULAR | Status: DC
  Administered 2023-10-09: 2 [IU] via SUBCUTANEOUS
  Filled 2023-10-08: qty 1

## 2023-10-08 MED ORDER — CEFAZOLIN SODIUM-DEXTROSE 2-4 GM/100ML-% IV SOLN
2.0000 g | Freq: Three times a day (TID) | INTRAVENOUS | Status: DC
  Administered 2023-10-08 – 2023-10-10 (×8): 2 g via INTRAVENOUS
  Filled 2023-10-08 (×8): qty 100

## 2023-10-08 MED ORDER — LEVOTHYROXINE SODIUM 50 MCG PO TABS
150.0000 ug | ORAL_TABLET | Freq: Every day | ORAL | Status: DC
  Administered 2023-10-08 – 2023-10-10 (×3): 150 ug via ORAL
  Filled 2023-10-08 (×3): qty 1

## 2023-10-08 MED ORDER — SENNOSIDES-DOCUSATE SODIUM 8.6-50 MG PO TABS: 1.0000 | ORAL_TABLET | Freq: Every evening | ORAL | Status: DC | PRN

## 2023-10-08 MED ORDER — FOLIC ACID 1 MG PO TABS
1.0000 mg | ORAL_TABLET | Freq: Every day | ORAL | Status: DC
  Administered 2023-10-08 – 2023-10-10 (×3): 1 mg via ORAL
  Filled 2023-10-08 (×3): qty 1

## 2023-10-08 MED ORDER — FUROSEMIDE 10 MG/ML IJ SOLN
40.0000 mg | Freq: Two times a day (BID) | INTRAMUSCULAR | Status: DC
  Administered 2023-10-08 – 2023-10-10 (×5): 40 mg via INTRAVENOUS
  Filled 2023-10-08 (×5): qty 4

## 2023-10-08 MED ORDER — POTASSIUM CHLORIDE CRYS ER 20 MEQ PO TBCR
40.0000 meq | EXTENDED_RELEASE_TABLET | ORAL | Status: AC
  Administered 2023-10-08 (×2): 40 meq via ORAL
  Filled 2023-10-08: qty 2

## 2023-10-08 NOTE — Progress Notes (Signed)
 PROGRESS NOTE Bonnie Hinton  FMW:[id number] DOB: [birth year] DOA: 10/07/2023 PCP: Marylynn Verneita CROME, MD  Brief Narrative/Hospital Course: 79 yof w/ hx of lymphedema, T2DM,  hypothyroidism, afib on Xarelto  presented for evaluation of worsening b/l  leg edema, erythema, and weeping for 2-3 months despite completing 3 rounds of antibiotics and being followed by home health nurse but without improvement. In the ED: Vitals stable afebrile WBC 10.9, non-tachycardic. She was started on empiric antibiotics and fluids and consulted hospitalist for admission.   Subjective: Seen and examined Leg swelling improving ans says she is peeing a lot overnight BP stable afebrile on room air Labs mild hypokalemia 3.3 stable CBC and renal function Daughter at bedside  Assessment and plan:  B/l LE edema with concern for infection: Patient has been on 3 rounds of antibiotics at home followed by home health nurse but without improvement On admission afebrile WBC stable no signs of sepsis.  Suspecting worsening Lymphedema vs venous stasis dermatitis/venous insufficiency, possibly overlying fungal infection at this time.  Echo from 1 year ago unremarkable. Continue IV diuresis  At home on Lasix  20 mg ( likley not enough for her weight) Cont nystatin  powder and empiric antibiotics iv for superimposed cellulitis- hold home doxycycline  and Keflex . Follow-up blood culture Will need referral to lymphedema clinic and compression stockings at d/c if available  Encourage PT OT   T2DM with uncontrolled hyperglycemia: At home on Lantus  25 units, continue Semglee  20 units and sliding scale insulin  Recent Labs  Lab 10/08/23 0043 10/08/23 0731  GLUCAP 181* 208*     Rheumatoid arthritis Well-controlled, continue home hydroxychloroquine , methotrexate  and folic acid    Paroxysmal Afib Rate controlled continue Xarelto  and metoprolol    HTN BP stable continue home Lopressor  50 twice daily, diuretics, amlodipine  10.   Holding Aldactone .  Continue home medications    Hypothyroidism  Continue Synthroid    OSA  encourage compliance with home CPAP machine   Morbid Obesity w/ Body mass index is 49.45 kg/m.: Will benefit with PCP follow-up, weight loss,healthy lifestyle  DVT prophylaxis:  Code Status:   Code Status: Full Code Family Communication: plan of care discussed with patient at bedside. Patient status is: Remains hospitalized because of severity of illness Level of care: Med-Surg   Dispo: The patient is from: home            Anticipated disposition: TBD Objective: Vitals last 24 hrs: Vitals:   10/08/23 0000 10/08/23 0031 10/08/23 0529 10/08/23 0730  BP: (!) 142/57  (!) 121/48 (!) 147/56  Pulse: 82  88 84  Resp: 15  16 18   Temp:  98.3 F (36.8 C) 97.7 F (36.5 C) 98 F (36.7 C)  TempSrc:  Oral Oral Oral  SpO2: 96%  99% 98%  Weight:      Height:        Physical Examination: General exam: alert awake, older than stated age HEENT:Oral mucosa moist, Ear/Nose WNL grossly Respiratory system: Bilaterally clear BS, no use of accessory muscle Cardiovascular system: S1 & S2 +. Gastrointestinal system: Abdomen soft,  NT,ND,BS+ Nervous System: Alert, awake,  following commands. Extremities: LE lymphedema with chronic appearing hyperpigmentation  Skin: No rashes,warm. MSK: Normal muscle bulk/tone.   Data Reviewed: I have personally reviewed following labs and imaging studies ( see epic result tab) CBC: Recent Labs  Lab 10/07/23 1757 10/08/23 0408  WBC 10.9* 8.4  HGB 13.5 11.7*  HCT 41.0 36.4  MCV 94.9 94.3  PLT 385 349   CMP: Recent Labs  Lab 10/07/23  1757 10/07/23 2237 10/08/23 0408  NA 135 138 138  K 4.3 3.5 3.3*  CL 98 99 102  CO2 23 26 25   GLUCOSE 219* 172* 174*  BUN 19 19 15   CREATININE 1.04* 0.90 0.78  CALCIUM  9.2 8.9 8.4*   GFR: Estimated Creatinine Clearance: 84.6 mL/min (by C-G formula based on SCr of 0.78 mg/dL). Recent Labs  Lab 10/07/23 2237  AST 18   ALT 12  ALKPHOS 73  BILITOT 0.7  PROT 7.2  ALBUMIN 3.5   No results for input(s): LIPASE, AMYLASE in the last 168 hours. No results for input(s): AMMONIA in the last 168 hours. Coagulation Profile:  Recent Labs  Lab 10/07/23 2237  INR 1.6*   Unresulted Labs (From admission, onward)     Start     Ordered   10/08/23 0025  C-reactive protein  Add-on,   AD        10/08/23 0024           Antimicrobials/Microbiology: Anti-infectives (From admission, onward)    Start     Dose/Rate Route Frequency Ordered Stop   10/08/23 1000  hydroxychloroquine  (PLAQUENIL ) tablet 200 mg        200 mg Oral 2 times daily 10/08/23 0023     10/08/23 0700  ceFAZolin  (ANCEF ) IVPB 2g/100 mL premix        2 g 200 mL/hr over 30 Minutes Intravenous Every 8 hours 10/08/23 0012 10/15/23 0559   10/07/23 2130  cefTRIAXone  (ROCEPHIN ) 2 g in sodium chloride  0.9 % 100 mL IVPB        2 g 200 mL/hr over 30 Minutes Intravenous Once 10/07/23 2118 10/07/23 2254   10/07/23 2130  vancomycin  (VANCOCIN ) IVPB 1000 mg/200 mL premix        1,000 mg 200 mL/hr over 60 Minutes Intravenous  Once 10/07/23 2118 10/08/23 0029         Component Value Date/Time   SDES BLOOD BLOOD LEFT ARM 10/07/2023 2237   SDES BLOOD BLOOD LEFT ARM 10/07/2023 2237   SPECREQUEST  10/07/2023 2237    BOTTLES DRAWN AEROBIC AND ANAEROBIC Blood Culture adequate volume   SPECREQUEST  10/07/2023 2237    BOTTLES DRAWN AEROBIC AND ANAEROBIC Blood Culture results may not be optimal due to an inadequate volume of blood received in culture bottles   CULT  10/07/2023 2237    NO GROWTH < 12 HOURS Performed at [facility], [address] [place], [place] [address]    CULT  10/07/2023 2237    NO GROWTH < 12 HOURS Performed at [facility], [address] [place], [place] [address]    REPTSTATUS PENDING 10/07/2023 2237   REPTSTATUS PENDING 10/07/2023 2237    Procedures:  Medications reviewed:  Scheduled Meds:   amLODipine   10 mg Oral Daily   cycloSPORINE   1 drop Both Eyes BID   ezetimibe   10 mg Oral Daily   folic acid   1 mg Oral Daily   furosemide   40 mg Intravenous Q12H   hydroxychloroquine   200 mg Oral BID   insulin  aspart  0-15 Units Subcutaneous TID WC   insulin  aspart  0-5 Units Subcutaneous QHS   insulin  glargine-yfgn  20 Units Subcutaneous QHS   levothyroxine   150 mcg Oral Q0600   methotrexate   12.5 mg Oral Q Mon   metoprolol  tartrate  50 mg Oral BID   nystatin    Topical BID   pantoprazole   40 mg Oral Daily   potassium chloride   40 mEq Oral  Q3H   rivaroxaban   20 mg Oral Q supper   Continuous Infusions:   ceFAZolin  (ANCEF ) IV Stopped (10/08/23 0753)    Mennie LAMY, MD [facility] 10/08/2023, 9:46 AM

## 2023-10-08 NOTE — Progress Notes (Signed)
 Pt admitted to room 202 at 1735 from ED. VSS on RA. Denies pain at this time. Oriented pt to call bell, staff, and room. Assessment completed

## 2023-10-08 NOTE — Hospital Course (Addendum)
 82yo with h/o lymphedema, DM, hypothyroidism, and afib on Xarelto  who presented on 7/15 with BLE edema, erythema, and weeping despite 3 rounds of home antibiotics.  She was started on IV antibiotics and IV diuretics.

## 2023-10-08 NOTE — Progress Notes (Signed)
 BG at 1735 was 177. BG did not transfer over to results tab. Verified BG with charge RN.

## 2023-10-09 DIAGNOSIS — I890 Lymphedema, not elsewhere classified: Secondary | ICD-10-CM | POA: Diagnosis not present

## 2023-10-09 LAB — BASIC METABOLIC PANEL WITH GFR
Anion gap: 12 (ref 5–15)
BUN: 13 mg/dL (ref 8–23)
CO2: 27 mmol/L (ref 22–32)
Calcium: 8.8 mg/dL — ABNORMAL LOW (ref 8.9–10.3)
Chloride: 99 mmol/L (ref 98–111)
Creatinine, Ser: 0.9 mg/dL (ref 0.44–1.00)
GFR, Estimated: >60 mL/min (ref >60)
Glucose, Bld: 195 mg/dL — ABNORMAL HIGH (ref 70–99)
Potassium: 3.9 mmol/L (ref 3.5–5.1)
Sodium: 138 mmol/L (ref 135–145)

## 2023-10-09 LAB — GLUCOSE, CAPILLARY
Glucose-Capillary: 177 mg/dL — ABNORMAL HIGH (ref 70–99)
Glucose-Capillary: 189 mg/dL — ABNORMAL HIGH (ref 70–99)
Glucose-Capillary: 253 mg/dL — ABNORMAL HIGH (ref 70–99)
Glucose-Capillary: 173 mg/dL — ABNORMAL HIGH (ref 70–99)
Glucose-Capillary: 201 mg/dL — ABNORMAL HIGH (ref 70–99)

## 2023-10-09 MED ORDER — POTASSIUM CHLORIDE CRYS ER 20 MEQ PO TBCR
20.0000 meq | EXTENDED_RELEASE_TABLET | Freq: Two times a day (BID) | ORAL | Status: AC
  Administered 2023-10-09 (×2): 20 meq via ORAL
  Filled 2023-10-09 (×2): qty 1

## 2023-10-09 NOTE — Care Management Obs Status (Signed)
 MEDICARE OBSERVATION STATUS NOTIFICATION   Patient Details  Name: Bonnie Hinton MRN: [id number] Date of Birth: April 08, 1940   Medicare Observation Status Notification Given:       Rojelio SHAUNNA Rattler 10/09/2023, 10:20 AM

## 2023-10-09 NOTE — Plan of Care (Signed)

## 2023-10-09 NOTE — Progress Notes (Signed)
 Mobility Specialist - Progress Note   10/09/23 1100  Mobility  Activity Transferred from chair to bed  Level of Assistance Contact guard assist, steadying assist  Assistive Device Other (Comment) (S HHA)  Activity Response Tolerated well  Mobility visit 1 Mobility     Author responding to pt yelling out for help. Pt requesting assistance to return to bed; reporting soreness in BLE d/t sitting up in recliner. STS from chair with modA. S HHA to pivot to bed. Assist on LE to return supine. Pt placed in semi-fowler position in bed. Alarm set, needs in reach.    Lennette Seip Mobility Specialist 10/09/23, 11:13 AM

## 2023-10-09 NOTE — Evaluation (Signed)
 Occupational Therapy Evaluation Patient Details Name: Bonnie Hinton MRN: [id number] DOB: Jul 29, 1940 Today's Date: 10/09/2023   History of Present Illness   83 yo F presented to ED on 7/15 for evaluation of worsening B leg edema, erythema, and weeping for 2-3 months despite completing 3 rounds of antibiotics. PMH: right carpal tunnel release performed on 08/21/23,  lymphedema, T2DM,  hypothyroidism, afib on Xarelto .    Clinical Impressions Ms Luffman was seen for OT evaluation this date. Prior to hospital admission, pt was MOD I using B platform RW and manual w/c for mobility. Pt lives alone, daughter assists with grocery shopping. Pt currently requires  SETUP + SBA standing grooming.  MAX A pericare standing - reports use of AE for pericare at home. CGA + RW for bed>chair t/f ~5 steps. MOD A for LB bathing in sitting, SETUP UB bathing - assist for bottom and lower legs. Pt would benefit from skilled OT to address noted impairments and functional limitations (see below for any additional details). Upon hospital discharge, recommend OT follow up.    If plan is discharge home, recommend the following:   A little help with walking and/or transfers;A little help with bathing/dressing/bathroom;Help with stairs or ramp for entrance     Functional Status Assessment   Patient has had a recent decline in their functional status and demonstrates the ability to make significant improvements in function in a reasonable and predictable amount of time.     Equipment Recommendations   None recommended by OT     Recommendations for Other Services         Precautions/Restrictions   Precautions Precautions: Fall Recall of Precautions/Restrictions: Intact Restrictions Weight Bearing Restrictions Per Provider Order: No     Mobility Bed Mobility               General bed mobility comments: not tested    Transfers Overall transfer level: Needs assistance Equipment used:  Rolling walker (2 wheels) Transfers: Sit to/from Stand, Bed to chair/wheelchair/BSC Sit to Stand: Contact guard assist     Step pivot transfers: Contact guard assist            Balance Overall balance assessment: Needs assistance Sitting-balance support: No upper extremity supported, Feet supported Sitting balance-Leahy Scale: Normal     Standing balance support: Single extremity supported, During functional activity Standing balance-Leahy Scale: Fair                             ADL either performed or assessed with clinical judgement   ADL Overall ADL's : Needs assistance/impaired                                       General ADL Comments: SETUP + SBA standing grooming. MAX A don B socks in sitting. MAX A pericare standing - reports use of AE for pericare at home. CGA + RW for simulated BSC t/f. MOD A for LB bathing, SETUP UB bathing - assist for bottom and lower legs     Vision         Perception         Praxis         Pertinent Vitals/Pain Pain Assessment Pain Assessment: No/denies pain     Extremity/Trunk Assessment Upper Extremity Assessment Upper Extremity Assessment: Right hand dominant;RUE deficits/detail RUE Deficits / Details: hx of recent 08/21/23  carpal tunnel release impacting grip however functionally completes BADLs. 3/5 grip   Lower Extremity Assessment Lower Extremity Assessment: Generalized weakness       Communication Communication Communication: No apparent difficulties   Cognition Arousal: Alert Behavior During Therapy: WFL for tasks assessed/performed Cognition: No apparent impairments                               Following commands: Intact       Cueing  General Comments          Exercises     Shoulder Instructions      Home Living Family/patient expects to be discharged to:: Private residence Living Arrangements: Alone Available Help at Discharge: Family;Available  PRN/intermittently Type of Home: House Home Access: Stairs to enter [employer] of Steps: threshold step   Home Layout: One level     Bathroom Shower/Tub: Producer, television/film/video: Handicapped height Bathroom Accessibility: Yes   Home Equipment: Agricultural consultant (2 wheels);BSC/3in1;Grab bars - toilet;Grab bars - tub/shower;Wheelchair - manual;Lift chair   Additional Comments: upright walker      Prior Functioning/Environment Prior Level of Function : Driving;Independent/Modified Independent             Mobility Comments: RW with HHPT and to walk into bathroom. Manual w/c in house and community, reports can wheel over threshold step backwards IND ADLs Comments: daughter assists with grocery shopping, drives self to [place]    OT Problem List: Decreased strength;Decreased range of motion;Decreased activity tolerance;Impaired balance (sitting and/or standing)   OT Treatment/Interventions: Self-care/ADL training;Therapeutic exercise;Energy conservation;DME and/or AE instruction;Therapeutic activities;Patient/family education      OT Goals(Current goals can be found in the care plan section)   Acute Rehab OT Goals Patient Stated Goal: to go home OT Goal Formulation: With patient Time For Goal Achievement: 10/23/23 Potential to Achieve Goals: Good ADL Goals Pt Will Perform Grooming: with modified independence;standing Pt Will Perform Lower Body Dressing: with modified independence;sit to/from stand;with adaptive equipment Pt Will Transfer to Toilet: with modified independence;ambulating;regular height toilet   OT Frequency:  Min 3X/week    Co-evaluation              AM-PAC OT 6 Clicks Daily Activity     Outcome Measure Help from another person eating meals?: None Help from another person taking care of personal grooming?: None Help from another person toileting, which includes using toliet, bedpan, or urinal?: A Lot Help from another person  bathing (including washing, rinsing, drying)?: A Little Help from another person to put on and taking off regular upper body clothing?: None Help from another person to put on and taking off regular lower body clothing?: A Lot 6 Click Score: 19   End of Session Equipment Utilized During Treatment: Rolling walker (2 wheels) Nurse Communication: Mobility status  Activity Tolerance: Patient tolerated treatment well Patient left: in chair;with call bell/phone within reach;with chair alarm set  OT Visit Diagnosis: Other abnormalities of gait and mobility (R26.89);Muscle weakness (generalized) (M62.81)                Time: 9085-9058 OT Time Calculation (min): 27 min Charges:  OT General Charges $OT Visit: 1 Visit OT Evaluation $OT Eval Low Complexity: 1 Low OT Treatments $Self Care/Home Management : 8-22 mins  Elston Slot, M.S. OTR/L  10/09/23, 10:09 AM  ascom [phone]

## 2023-10-09 NOTE — Progress Notes (Signed)
 PROGRESS NOTE Bonnie Hinton  FMW:[id number] DOB: 07-30-1940 DOA: 10/07/2023 PCP: Marylynn Verneita CROME, MD  Brief Narrative/Hospital Course: 21 yof w/ hx of lymphedema, T2DM,  hypothyroidism, afib on Xarelto  presented for evaluation of worsening b/l  leg edema, erythema, and weeping for 2-3 months despite completing 3 rounds of antibiotics and being followed by home health nurse but without improvement. In the ED: Vitals stable afebrile WBC 10.9, non-tachycardic. She was started on empiric antibiotics and fluids and consulted hospitalist for admission.  Patient was placed on IV diuretics, IV antibiotics and admitted  Subjective: Seen examined Feels swelling pain and redness improving in leg Overnight afebrile BP stable Labs  stable bmp Patient has been requested   Assessment and plan:  B/l LE edema/lymphedema with worsening of swelling, concern for superimposed cellulitis: Patient has been on 3 rounds of antibiotics at home followed by home health nurse but without improvement On admission afebrile WBC stable no signs of sepsis.  Suspecting worsening Lymphedema vs venous stasis dermatitis/venous insufficiency, possibly overlying secondary bacterial infection/fungal infection. Echo from 1 year ago unremarkable. Continue on IV antibiotics, nystatin  and IV diuretics here..At home on Lasix  20 mg and Keflex  and doxycycline  Blood culture NGTD.  Mobilize, supportive care. Will need referral to lymphedema clinic and compression stockings at d/c if available  Encourage PT OT and ambulation  Hypokalemia: Replaced, recheck pending. Cont po kdur daily while on lasix    T2DM with uncontrolled hyperglycemia: At home on Lantus  25 units, continue Semglee  20 units and sliding scale insulin  Recent Labs  Lab 10/08/23 0731 10/08/23 1130 10/08/23 1735 10/08/23 2106 10/09/23 0745  GLUCAP 208* 218* 177* 193* 189*     Rheumatoid arthritis Well-controlled, continue home hydroxychloroquine ,  methotrexate  and folic acid    Paroxysmal Afib Rate controlled continue Xarelto  and metoprolol    HTN BP well-controlled continue home Lopressor  50 twice daily, diuretics, amlodipine  10.  Holding Aldactone .  Continue home medications    Hypothyroidism  Continue Synthroid    OSA  encourage compliance with home CPAP machine   Morbid Obesity w/ Body mass index is 49.45 kg/m.: Will benefit with PCP follow-up, weight loss,healthy lifestyle  DVT prophylaxis: XARELTO  Code Status:   Code Status: Full Code Family Communication: plan of care discussed with patient at bedside. Patient status is: Remains hospitalized because of severity of illness Level of care: Med-Surg   Dispo: The patient is from: home            Anticipated disposition: home 1-2 DAYS. PTOT ordered. Objective: Vitals last 24 hrs: Vitals:   10/08/23 1735 10/08/23 1948 10/09/23 0417 10/09/23 0741  BP: (!) 123/54 (!) 134/55 (!) 138/56 (!) 126/59  Pulse: 75 79 73 79  Resp: 20 18 16 18   Temp: 98.1 F (36.7 C) 98.7 F (37.1 C) 97.9 F (36.6 C) 98.2 F (36.8 C)  TempSrc: Oral Oral Oral Oral  SpO2: 98% 98% 96% 95%  Weight:      Height:        Physical Examination: General exam: alert awake, oriented , OBESE HEENT:Oral mucosa moist, Ear/Nose WNL grossly Respiratory system: Bilaterally clear BS,no use of accessory muscle Cardiovascular system: S1 & S2 +, No JVD. Gastrointestinal system: Abdomen soft,NT,ND, BS+ Nervous System: Alert, awake, moving all extremities,and following commands. Extremities: LE edema ++ W. ERYTHEMA HYPERPIGMENTATION SWELLING- LYMPHEDEMA AND TENDERNESS Skin: No rashes,no icterus. MSK: Normal muscle bulk,tone, power   Data Reviewed: I have personally reviewed following labs and imaging studies ( see epic result tab) CBC: Recent Labs  Lab 10/07/23 1757  10/08/23 0408  WBC 10.9* 8.4  HGB 13.5 11.7*  HCT 41.0 36.4  MCV 94.9 94.3  PLT 385 349   CMP: Recent Labs  Lab 10/07/23 1757  10/07/23 2237 10/08/23 0408 10/09/23 0856  NA 135 138 138 138  K 4.3 3.5 3.3* 3.9  CL 98 99 102 99  CO2 23 26 25 27   GLUCOSE 219* 172* 174* 195*  BUN 19 19 15 13   CREATININE 1.04* 0.90 0.78 0.90  CALCIUM  9.2 8.9 8.4* 8.8*   GFR: Estimated Creatinine Clearance: 75.2 mL/min (by C-G formula based on SCr of 0.9 mg/dL). Recent Labs  Lab 10/07/23 2237  AST 18  ALT 12  ALKPHOS 73  BILITOT 0.7  PROT 7.2  ALBUMIN 3.5   No results for input(s): LIPASE, AMYLASE in the last 168 hours. No results for input(s): AMMONIA in the last 168 hours. Coagulation Profile:  Recent Labs  Lab 10/07/23 2237  INR 1.6*   Unresulted Labs (From admission, onward)     Start     Ordered   10/10/23 0500  Basic metabolic panel with GFR  Tomorrow morning,   R        10/09/23 0826           Antimicrobials/Microbiology: Anti-infectives (From admission, onward)    Start     Dose/Rate Route Frequency Ordered Stop   10/08/23 1000  hydroxychloroquine  (PLAQUENIL ) tablet 200 mg        200 mg Oral 2 times daily 10/08/23 0023     10/08/23 0700  ceFAZolin  (ANCEF ) IVPB 2g/100 mL premix        2 g 200 mL/hr over 30 Minutes Intravenous Every 8 hours 10/08/23 0012 10/15/23 0559   10/07/23 2130  cefTRIAXone  (ROCEPHIN ) 2 g in sodium chloride  0.9 % 100 mL IVPB        2 g 200 mL/hr over 30 Minutes Intravenous Once 10/07/23 2118 10/07/23 2254   10/07/23 2130  vancomycin  (VANCOCIN ) IVPB 1000 mg/200 mL premix        1,000 mg 200 mL/hr over 60 Minutes Intravenous  Once 10/07/23 2118 10/08/23 0029         Component Value Date/Time   SDES BLOOD BLOOD LEFT ARM 10/07/2023 2237   SDES BLOOD BLOOD LEFT ARM 10/07/2023 2237   SPECREQUEST  10/07/2023 2237    BOTTLES DRAWN AEROBIC AND ANAEROBIC Blood Culture adequate volume   SPECREQUEST  10/07/2023 2237    BOTTLES DRAWN AEROBIC AND ANAEROBIC Blood Culture results may not be optimal due to an inadequate volume of blood received in culture bottles   CULT   10/07/2023 2237    NO GROWTH 2 DAYS Performed at [facility], [address] [place] [place], [place] [address]    CULT  10/07/2023 2237    NO GROWTH 2 DAYS Performed at [facility], [address] [place], [place] [address]    REPTSTATUS PENDING 10/07/2023 2237   REPTSTATUS PENDING 10/07/2023 2237    Procedures:  Medications reviewed:  Scheduled Meds:  amLODipine   10 mg Oral Daily   cycloSPORINE   1 drop Both Eyes BID   ezetimibe   10 mg Oral Daily   folic acid   1 mg Oral Daily   furosemide   40 mg Intravenous Q12H   hydroxychloroquine   200 mg Oral BID   insulin  aspart  0-15 Units Subcutaneous TID WC   insulin  aspart  0-5 Units Subcutaneous QHS   insulin  glargine-yfgn  20 Units Subcutaneous QHS   levothyroxine   150 mcg Oral  [id number]   methotrexate   12.5 mg Oral Q Mon   metoprolol  tartrate  50 mg Oral BID   nystatin    Topical BID   pantoprazole   40 mg Oral Daily   potassium chloride   20 mEq Oral BID   rivaroxaban   20 mg Oral Q supper   Continuous Infusions:   ceFAZolin  (ANCEF ) IV 2 g (10/09/23 0527)    Mennie LAMY, MD [facility] 10/09/2023, 11:15 AM

## 2023-10-09 NOTE — Evaluation (Signed)
 Physical Therapy Evaluation Patient Details Name: Bonnie Hinton MRN: [id number] DOB: 08-06-1940 Today's Date: 10/09/2023  History of Present Illness  83 yo F presented to ED on 7/15 for evaluation of worsening B leg edema, erythema, and weeping for 2-3 months despite completing 3 rounds of antibiotics. PMH: right carpal tunnel release performed on 08/21/23,  lymphedema, T2DM,  hypothyroidism, afib on Xarelto .   Clinical Impression  Pt admitted with above diagnosis. Pt currently with functional limitations due to the deficits listed below (see PT Problem List). Pt received upright in bed agreeable to PT. PTA reports ambulating with HHPT but typically mod-I with manual w/c and SPT's in home. Daughter assists PRN with IADL's.   To date, pt requires minA for supine to sit. With bed slightly elevated to home heights, pt performs STS and SPT with RW from bed to W/c at [facility]. Pt reports being close to baseline. Reports LE swelling has been biggest limiting factor in indep mobility. Pt feels comfortable with d/c home at current capacity. Plan for pt to return home and continue with HHPT services. Pt with all needs in reach in recliner with LE's elevated.      If plan is discharge home, recommend the following: A little help with walking and/or transfers;A little help with bathing/dressing/bathroom;Assistance with cooking/housework;Assist for transportation   Can travel by private vehicle        Equipment Recommendations None recommended by PT  Recommendations for Other Services       Functional Status Assessment Patient has had a recent decline in their functional status and demonstrates the ability to make significant improvements in function in a reasonable and predictable amount of time.     Precautions / Restrictions Precautions Precautions: Fall Recall of Precautions/Restrictions: Intact Restrictions Weight Bearing Restrictions Per Provider Order: No      Mobility  Bed  Mobility Overal bed mobility: Needs Assistance Bed Mobility: Supine to Sit     Supine to sit: Min assist, HOB elevated     General bed mobility comments: sleeps in lift chair at baseline Patient Response: Cooperative  Transfers Overall transfer level: Needs assistance Equipment used: Rolling walker (2 wheels) Transfers: Sit to/from Stand, Bed to chair/wheelchair/BSC Sit to Stand: Contact guard assist   Step pivot transfers: Contact guard assist            Ambulation/Gait                  Stairs            Wheelchair Mobility     Tilt Bed Tilt Bed Patient Response: Cooperative  Modified Rankin (Stroke Patients Only)       Balance Overall balance assessment: Needs assistance Sitting-balance support: No upper extremity supported, Feet supported Sitting balance-Leahy Scale: Normal     Standing balance support: Single extremity supported, During functional activity Standing balance-Leahy Scale: Fair                               Pertinent Vitals/Pain Pain Assessment Pain Assessment: No/denies pain    Home Living Family/patient expects to be discharged to:: Private residence Living Arrangements: Alone Available Help at Discharge: Family;Available PRN/intermittently Type of Home: House Home Access: Stairs to enter   Entrance Stairs-Number of Steps: threshold step   Home Layout: One level Home Equipment: Rolling Walker (2 wheels);BSC/3in1;Grab bars - toilet;Grab bars - tub/shower;Wheelchair - manual;Lift chair Additional Comments: upright walker    Prior Function Prior  Level of Function : Driving;Independent/Modified Independent             Mobility Comments: RW with HHPT and to walk into bathroom. Manual w/c in house and community, reports can wheel over threshold step backwards IND ADLs Comments: daughter assists with grocery shopping, drives self to MD     Extremity/Trunk Assessment   Upper Extremity Assessment Upper  Extremity Assessment: Defer to OT evaluation    Lower Extremity Assessment Lower Extremity Assessment: Generalized weakness    Cervical / Trunk Assessment Cervical / Trunk Assessment: Normal  Communication   Communication Communication: No apparent difficulties    Cognition Arousal: Alert Behavior During Therapy: WFL for tasks assessed/performed   PT - Cognitive impairments: No apparent impairments                         Following commands: Intact       Cueing Cueing Techniques: Verbal cues     General Comments      Exercises     Assessment/Plan    PT Assessment Patient needs continued PT services  PT Problem List Decreased strength;Decreased mobility;Decreased activity tolerance       PT Treatment Interventions DME instruction;Therapeutic activities;Gait training;Therapeutic exercise;Patient/family education;Balance training;Functional mobility training;Neuromuscular re-education    PT Goals (Current goals can be found in the Care Plan section)  Acute Rehab PT Goals Patient Stated Goal: to go home PT Goal Formulation: With patient Time For Goal Achievement: 10/23/23 Potential to Achieve Goals: Good    Frequency Min 2X/week     Co-evaluation               AM-PAC PT 6 Clicks Mobility  Outcome Measure Help needed turning from your back to your side while in a flat bed without using bedrails?: A Lot Help needed moving from lying on your back to sitting on the side of a flat bed without using bedrails?: A Lot Help needed moving to and from a bed to a chair (including a wheelchair)?: A Little Help needed standing up from a chair using your arms (e.g., wheelchair or bedside chair)?: A Little Help needed to walk in hospital room?: A Lot Help needed climbing 3-5 steps with a railing? : Total 6 Click Score: 13    End of Session Equipment Utilized During Treatment: Gait belt Activity Tolerance: Patient tolerated treatment well Patient left:  in chair;with call bell/phone within reach;with chair alarm set Nurse Communication: Mobility status PT Visit Diagnosis: Muscle weakness (generalized) (M62.81);Difficulty in walking, not elsewhere classified (R26.2)    Time: 8567-8494 PT Time Calculation (min) (ACUTE ONLY): 33 min   Charges:   PT Evaluation $PT Eval Low Complexity: 1 Low PT Treatments $Therapeutic Activity: 8-22 mins PT General Charges $$ ACUTE PT VISIT: 1 Visit         Dorina HERO. Fairly IV, PT, DPT Physical Therapist- [place]  [facility] 10/09/2023, 3:59 PM

## 2023-10-10 ENCOUNTER — Other Ambulatory Visit: Payer: Self-pay

## 2023-10-10 DIAGNOSIS — I872 Venous insufficiency (chronic) (peripheral): Secondary | ICD-10-CM

## 2023-10-10 LAB — BASIC METABOLIC PANEL WITH GFR
Anion gap: 13 (ref 5–15)
BUN: 13 mg/dL (ref 8–23)
CO2: 24 mmol/L (ref 22–32)
Calcium: 8.4 mg/dL — ABNORMAL LOW (ref 8.9–10.3)
Chloride: 98 mmol/L (ref 98–111)
Creatinine, Ser: 0.86 mg/dL (ref 0.44–1.00)
GFR, Estimated: >60 mL/min (ref >60)
Glucose, Bld: 196 mg/dL — ABNORMAL HIGH (ref 70–99)
Potassium: 4.4 mmol/L (ref 3.5–5.1)
Sodium: 135 mmol/L (ref 135–145)

## 2023-10-10 LAB — GLUCOSE, CAPILLARY
Glucose-Capillary: 198 mg/dL — ABNORMAL HIGH (ref 70–99)
Glucose-Capillary: 217 mg/dL — ABNORMAL HIGH (ref 70–99)
Glucose-Capillary: 188 mg/dL — ABNORMAL HIGH (ref 70–99)

## 2023-10-10 MED ORDER — TRIAMCINOLONE ACETONIDE 0.1 % EX CREA
TOPICAL_CREAM | Freq: Two times a day (BID) | CUTANEOUS | 0 refills | Status: DC
  Filled 2023-10-10: qty 60, 7d supply, fill #0

## 2023-10-10 MED ORDER — TRIAMCINOLONE 0.1 % CREAM:EUCERIN CREAM 1:1
TOPICAL_CREAM | Freq: Two times a day (BID) | CUTANEOUS | Status: DC
  Filled 2023-10-10 (×2): qty 60

## 2023-10-10 MED ORDER — CEPHALEXIN 500 MG PO CAPS
500.0000 mg | ORAL_CAPSULE | Freq: Four times a day (QID) | ORAL | 0 refills | Status: AC
Start: 2023-10-10 — End: 2023-10-15
  Filled 2023-10-10: qty 20, 5d supply, fill #0

## 2023-10-10 NOTE — Progress Notes (Signed)
 Pt has DC order. AVS was given to pt, explained, all questions answered. Daughter provided transport.

## 2023-10-10 NOTE — Inpatient Diabetes Management (Signed)
 Inpatient Diabetes Program Recommendations  AACE/ADA: New Consensus Statement on Inpatient Glycemic Control (2015)  Target Ranges:  Prepandial:   less than 140 mg/dL      Peak postprandial:   less than 180 mg/dL (1-2 hours)      Critically ill patients:  140 - 180 mg/dL    Latest Reference Range & Units 10/09/23 07:45 10/09/23 12:06 10/09/23 16:26 10/09/23 20:28  Glucose-Capillary 70 - 99 mg/dL 810 (H)  3 units Novolog   253 (H)  8 units Novolog   173 (H)  3 units Novolog   201 (H)  2 units Novolog   20 units Semglee   (H): Data is abnormally high  Latest Reference Range & Units 10/10/23 07:52  Glucose-Capillary 70 - 99 mg/dL 801 (H)  3 units Novolog    (H): Data is abnormally high     Home DM Meds: Lantus  25 units daily       Freestyle Libre 2 CGM  Current Orders: Semglee  20 units at HS     Novolog  Moderate Correction Scale/ SSI (0-15 units) TID AC + HS    MD- Please consider:  1. Increase Semglee  to 22 units at bedtime  2. Start Novolog  Meal Coverage: Novolog  3 units TID with meals HOLD if pt NPO HOLD if pt eats <50% meals    --Will follow patient during hospitalization--  Adina Rudolpho Arrow RN, MSN, CDCES Diabetes Coordinator Inpatient Glycemic Control Team Team Pager: [phone] (8a-5p)

## 2023-10-10 NOTE — Progress Notes (Signed)
 Mobility Specialist - Progress Note   10/10/23 1100  Mobility  Activity Ambulated with assistance in room;Ambulated with assistance to bathroom;Transferred from bed to chair  Level of Assistance Contact guard assist, steadying assist  Assistive Device Front wheel walker  Distance Ambulated (ft) 15 ft  Activity Response Tolerated well  Mobility visit 1 Mobility  Mobility Specialist Start Time (ACUTE ONLY) 1040  Mobility Specialist Stop Time (ACUTE ONLY) 1140  Mobility Specialist Time Calculation (min) (ACUTE ONLY) 60 min     Pt lying in bed upon arrival, utilizing RA. Pt agreeable to activity. Completed bed mobility with modA. STS with minA from slightly elevated bed height. Ambulated to bathroom with CGA +2 for safety. Pt a little anxious with mobility. R PFRW used. No LOB. Wide BOS. Forward flex lean during ambulation. TotalA with peri-care d/t BM. Pt ambulated to chair with alarm set, needs in reach. Pt anticipating d/c later this date.    Lennette Seip Mobility Specialist 10/10/23, 11:52 AM

## 2023-10-10 NOTE — Discharge Summary (Signed)
 Physician Discharge Summary   Patient: Bonnie Hinton MRN: [id number] DOB: [birth year]  Admit date:     10/07/2023  Discharge date: 10/10/23  Discharge Physician: Delon Herald   PCP: Marylynn Verneita CROME, MD   Recommendations at discharge:   Complete antibiotics (cephalexin  4 times a day through 7/23) Wound care - apply Eucerin with triamcinolone  cream to both lower legs twice daily  Continue home therapy Follow up with Dr. Marylynn in 1-2 weeks; will need lymphedema clinic referral and ABIs ordered Follow up with wound care clinic as scheduled Restrict your diet to 2000 mg (2 grams) of sodium daily Restrict your fluid intake to 1500 mL daily (no more than 50 ounces of total fluids)  Discharge Diagnoses: Principal Problem:   Stasis dermatitis of both legs Active Problems:   Diabetic nephropathy associated with type 2 diabetes mellitus (HCC)   Morbid obesity (HCC)   Essential hypertension, benign   Hyperlipidemia associated with type 2 diabetes mellitus (HCC)   Acquired hypothyroidism   Atrial fibrillation (HCC)   OSA (obstructive sleep apnea)   Rheumatoid arthritis involving multiple sites with positive rheumatoid factor (HCC)   Bilateral lower leg cellulitis   Lymphedema    Hospital Course: 83yo with h/o lymphedema, DM, hypothyroidism, and afib on Xarelto  who presented on 7/15 with BLE edema, erythema, and weeping despite 3 rounds of home antibiotics.  She was started on IV antibiotics and IV diuretics.  Assessment and Plan:  BLE lymphedema with stasis dermatitis, possible cellulitis Patient has been on 3 rounds of antibiotics at home followed by home health nurse but without improvement On admission, afebrile, WBC stable, no signs of sepsis Suspecting worsening Lymphedema vs venous stasis dermatitis/venous insufficiency, possibly overlying secondary bacterial infection/fungal infection (particularly in light of chronic immunosuppression) Echo from 1 year ago unremarkable,  given IV diuresis while here Treated with Cefazolin ; will complete course of cephalexin  given chronic immunosuppression (despite low current suspicion for active infection) Blood culture NGTD Recommend outpatient referral to lymphedema clinic  Encourage PT OT and ambulation - she is already enrolled in home health therapy Needs outpatient ABIs Eucerin:TAC BID Wound care clinic f/u, likely needs Unna boots Needs 2 gram sodium restriction and 1500 mL daily fluid restriction (no more than 50 ounces per day) Continue Lasix , spironolactone   T2DM with uncontrolled hyperglycemia A1c 8.6, poor control Resume home Lantus  Carb modified diet     Rheumatoid arthritis Well-controlled, continue home hydroxychloroquine , methotrexate  and folic acid  She is immunocompromised and so at increased risk for infections   Paroxysmal Afib Rate controlled with metoprolol  Continue Xarelto   HTN BP well-controlled  Continue home Lopressor , amlodipine    Hypothyroidism  Continue Synthroid   HLD Continue ezetimibe  Statin intolerant   OSA  Encourage compliance with home CPAP machine   Morbid/Class 3 obesity Body mass index is 49.45 kg/m.SABRA  Weight loss should be encouraged Outpatient PCP/bariatric medicine f/u encouraged Significantly low or high BMI is associated with higher medical risk including morbidity and mortality      Consultants: Wound care PT OT  Procedures: Echocardiogram 7/15  Antibiotics: Ceftriaxone  x 1 Vancomycin  x 1 Cefazolin  7/16-       Pain control - [place]  Controlled Substance Reporting System database was reviewed. and patient was instructed, not to drive, operate heavy machinery, perform activities at heights, swimming or participation in water activities or provide baby-sitting services while on Pain, Sleep and Anxiety Medications; until their outpatient Physician has advised to do so again. Also recommended to not to take more than prescribed  Pain, Sleep  and Anxiety Medications.   Disposition: Home Diet recommendation:  Carb modified diet DISCHARGE MEDICATION: Allergies as of 10/10/2023       Reactions   Erythromycin Rash   Fentanyl  Other (See Comments)   Duragesic -25 Duragesic -25   Iodinated Contrast Media Swelling, Rash   Atorvastatin     stomach problems   Metformin  And Related Other (See Comments)   Diarrhea even w/ XR metformin    Victoza  [liraglutide ]    Suspected to be the cause of gallstone pancreatitis June 2018   Latex Itching, Rash   Lisinopril Other (See Comments)   Hyperkalemia   Other Rash   Paper tape, possibly adhesive tape PAPER TAPE         Medication List     STOP taking these medications    cetirizine  10 MG tablet Commonly known as: ZYRTEC    doxycycline  100 MG tablet Commonly known as: VIBRA -TABS       TAKE these medications    acetaminophen  325 MG tablet Commonly known as: TYLENOL  Take 2 tablets (650 mg total) by mouth every 6 (six) hours as needed for mild pain (or Fever >/= 101).   amLODipine  10 MG tablet Commonly known as: NORVASC  Take 1 tablet (10 mg total) by mouth daily.   cephALEXin  500 MG capsule Commonly known as: KEFLEX  Take 1 capsule (500 mg total) by mouth 4 (four) times daily for 5 days.   cyanocobalamin  1000 MCG/ML injection Commonly known as: VITAMIN B12 Inject 1 mL (1,000 mcg total) into the muscle every 30 (thirty) days.   cycloSPORINE  0.05 % ophthalmic emulsion Commonly known as: RESTASIS  Place 1 drop into both eyes 2 (two) times daily.   dicyclomine  20 MG tablet Commonly known as: BENTYL  TAKE ONE TABLET BY MOUTH FOUR TIMES A DAY, BEFORE MEALS AND AT BEDTIME   ezetimibe  10 MG tablet Commonly known as: ZETIA  Take 1 tablet (10 mg total) by mouth daily.   folic acid  1 MG tablet Commonly known as: FOLVITE  Take 1 mg by mouth daily.   FreeStyle Libre 2 Sensor Misc USE AS DIRECTED. CHANGE SENSOR EVERY 14 DAYS   furosemide  20 MG tablet Commonly known as:  LASIX  TAKE ONE TABLET BY MOUTH EVERY DAY   hydroxychloroquine  200 MG tablet Commonly known as: PLAQUENIL  Take 200 mg by mouth 2 (two) times daily.   Lantus  SoloStar 100 UNIT/ML Solostar Pen Generic drug: insulin  glargine INJECT 25 UNITS UNDER THE SKIN DAILY AS DIRECTED *INCREASE AS DIRECTED BY PHYSICIAN*   methotrexate  2.5 MG tablet Commonly known as: RHEUMATREX Take 20 mg by mouth once a week.   metoprolol  tartrate 50 MG tablet Commonly known as: LOPRESSOR  TAKE ONE TABLET BY MOUTH TWICE A DAY   mupirocin  ointment 2 % Commonly known as: BACTROBAN  APPLY TO AFFECTED AREA TWICE A DAY   nystatin  cream Commonly known as: MYCOSTATIN  APPLY TO THE AFFECTED AREA(S) TWICE DAILY   pantoprazole  40 MG tablet Commonly known as: PROTONIX  Take 1 tablet (40 mg total) by mouth daily.   spironolactone  50 MG tablet Commonly known as: ALDACTONE  TAKE ONE TABLET BY MOUTH EVERY DAY   Synthroid  150 MCG tablet Generic drug: levothyroxine  TAKE ONE TABLET BY MOUTH EVERY DAY   tiZANidine  4 MG tablet Commonly known as: ZANAFLEX  TAKE 1 TABLET BY MOUTH EVERY 6 HOURS AS NEEDED FOR MUSCLE SPASMS   triamcinolone  0.1 % cream : eucerin Crea Apply 1 Application topically 2 (two) times daily.   trospium  20 MG tablet Commonly known as: SANCTURA  TAKE ONE TABLET BY MOUTH  TWICE A DAY   Vitamin D3 50 MCG (2000 UT) Tabs Take 1 tablet by mouth daily.   Xarelto  20 MG Tabs tablet Generic drug: rivaroxaban  TAKE ONE TABLET BY MOUTH EVERY DAY WITH SUPPER               Discharge Care Instructions  (From admission, onward)           Start     Ordered   10/10/23 0000  Discharge wound care:       Comments: Cleanse B lower legs with soap and water; dry and apply Eucerin:Triamcinolone  cream 2 times a day. If any open weeping areas may cover with Xeroform gauze Soila [phone]) and secure with Kerlix roll gauze.  Patient should keep legs elevated as much as possible.   10/10/23 1123             Discharge Exam:    Subjective: Fluid is better, legs are better, feels ready to go home.   Objective: Vitals:   10/10/23 0310 10/10/23 0750  BP: 130/61 (!) 125/53  Pulse: 66 68  Resp: 20 18  Temp: 97.8 F (36.6 C) 97.7 F (36.5 C)  SpO2: 97% 96%    Intake/Output Summary (Last 24 hours) at 10/10/2023 1124 Last data filed at 10/10/2023 0910 Gross per 24 hour  Intake 890 ml  Output 1340 ml  Net -450 ml   Filed Weights   10/07/23 1755  Weight: (!) 149.7 kg    Exam:  General:  Appears calm and comfortable and is in NAD, on RA Eyes:  normal lids, iris ENT:  grossly normal hearing, lips & tongue, mmm Cardiovascular:  RRR Respiratory:   CTA bilaterally with no wheezes/rales/rhonchi.  Normal respiratory effort. Abdomen:  soft, NT, ND, obese Skin:  stasis dermatitis of BLE without current evidence of cellulitis Musculoskeletal:  generally weak, no bony abnormality Psychiatric:  grossly normal mood and affect, speech fluent and appropriate, AOx3, very conversant Neurologic:  CN 2-12 grossly intact  Data Reviewed: I have reviewed the patient's lab results since admission.  Pertinent labs for today include:   Glucose 196    Condition at discharge: stable  The results of significant diagnostics from this hospitalization (including imaging, microbiology, ancillary and laboratory) are listed below for reference.   Imaging Studies: No results found.  Microbiology: Results for orders placed or performed during the hospital encounter of 10/07/23  Blood Culture (routine x 2)     Status: None (Preliminary result)   Collection Time: 10/07/23 10:37 PM   Specimen: BLOOD  Result Value Ref Range Status   Specimen Description BLOOD BLOOD LEFT ARM  Final   Special Requests   Final    BOTTLES DRAWN AEROBIC AND ANAEROBIC Blood Culture adequate volume   Culture   Final    NO GROWTH 3 DAYS Performed at [facility], [address] [place], [place] [address]     Report Status PENDING  Incomplete  Blood Culture (routine x 2)     Status: None (Preliminary result)   Collection Time: 10/07/23 10:37 PM   Specimen: BLOOD  Result Value Ref Range Status   Specimen Description BLOOD BLOOD LEFT ARM  Final   Special Requests   Final    BOTTLES DRAWN AEROBIC AND ANAEROBIC Blood Culture results may not be optimal due to an inadequate volume of blood received in culture bottles   Culture   Final    NO GROWTH 3 DAYS Performed at [facility], [address]  [place], [place] [address]    Report Status PENDING  Incomplete    Labs: CBC: Recent Labs  Lab 10/07/23 1757 10/08/23 0408  WBC 10.9* 8.4  HGB 13.5 11.7*  HCT 41.0 36.4  MCV 94.9 94.3  PLT 385 349   Basic Metabolic Panel: Recent Labs  Lab 10/07/23 1757 10/07/23 2237 10/08/23 0408 10/09/23 0856 10/10/23 0327  NA 135 138 138 138 135  K 4.3 3.5 3.3* 3.9 4.4  CL 98 99 102 99 98  CO2 23 26 25 27 24   GLUCOSE 219* 172* 174* 195* 196*  BUN 19 19 15 13 13   CREATININE 1.04* 0.90 0.78 0.90 0.86  CALCIUM  9.2 8.9 8.4* 8.8* 8.4*   Liver Function Tests: Recent Labs  Lab 10/07/23 2237  AST 18  ALT 12  ALKPHOS 73  BILITOT 0.7  PROT 7.2  ALBUMIN 3.5   CBG: Recent Labs  Lab 10/09/23 0745 10/09/23 1206 10/09/23 1626 10/09/23 2028 10/10/23 0752  GLUCAP 189* 253* 173* 201* 198*    Discharge time spent: greater than 30 minutes.  Signed: Delon Herald, MD [facility] 10/10/2023

## 2023-10-10 NOTE — Consult Note (Signed)
 WOC Nurse Consult Note: patient has had recurrent cellulitis in legs, no wound care center or vascular notes to indicate any ongoing follow-up; PCP note from 09/30/2023 indicates patient has been referred to wound care center for ongoing management of edema Reason for Consult: venous stasis ? Unna boot  Wound type: partial thickness r/t venous insufficiency  Pressure Injury POA: NA  Measurement: see nursing flowsheet, no open wounds noted in photo documentation  Wound azi:drjuuzmzi scabs, hyperkeratotic peeling skin  Drainage (amount, consistency, odor) appears dry  Periwound: erythema and edema (being treated for cellulitis)  Dressing procedure/placement/frequency: Cleanse B lower legs with soap and water, dry and apply Eucerin:Triamcinolone  cream 2 times a day. If any open weeping areas may cover with Xeroform gauze Soila [phone]) and secure with Kerlix roll gauze.  Patient should keep legs elevated as much as possible.   On review of EMR can not find notes that patient has worn unna boots or other compression in the past and do not find any ABIs to determine safe use.  Will defer any compression at this time to wound care center.   POC discussed with bedside nurse. WOC team will not follow. Re-consult if further needs arise.   Thank you,    Powell Bar MSN, RN-BC, CWOCN  Lymphedema  Resources (updated 01/2021) Each site requires a referral from your primary care MD [facility] [address] [place], [place]  [phone] (Upper extremities)  [address] [place], [place] [phone] (Lower extremities, PATIENT CAN NOT HAVE A WOUND)  [facility] [address] [place], [place] [address] [phone]  [facility] [address] Medical Office [address]  [place], [place] [phone]  [facility] [address] [place], [place]  [address] [phone]  [facility]  (only treatment for lymphedema related to cancer diagnosis) [address] [place] [address]  [place], [place] [address] [phone]    [facility] [address] [place], [place] [address] [phone] [facility] (formerly [facility]) [phone] [address] [place], [place] [address] [phone]

## 2023-10-10 NOTE — TOC Initial Note (Signed)
 Transition of Care [facility]) - Initial/Assessment Note    Patient Details  Name: Bonnie Hinton MRN: [id number] Date of Birth: 10/10/1940  Transition of Care [facility]) CM/SW Contact:    Corean ONEIDA Haddock, RN Phone Number: 10/10/2023, 8:48 AM  Clinical Narrative:                   Transition of Care (TOC) Screening Note   Patient Details  Name: Bonnie Hinton Date of Birth: 10/30/1940   Transition of Care [facility]) CM/SW Contact:    Corean ONEIDA Haddock, RN Phone Number: 10/10/2023, 8:49 AM    Transition of Care Department [facility]) has reviewed patient and no TOC needs have been identified at this time.  If new patient transition needs arise, please place a TOC consult.  Therapy recs for home health.  Patient already active with [facility].  MD notified        Patient Goals and CMS Choice            Expected Discharge Plan and Services                                              Prior Living Arrangements/Services                       Activities of Daily Living   ADL Screening (condition at time of admission) Independently performs ADLs?: No Does the patient have a NEW difficulty with bathing/dressing/toileting/self-feeding that is expected to last >3 days?: Yes (Initiates electronic notice to provider for possible OT consult) Does the patient have a NEW difficulty with getting in/out of bed, walking, or climbing stairs that is expected to last >3 days?: Yes (Initiates electronic notice to provider for possible PT consult) Does the patient have a NEW difficulty with communication that is expected to last >3 days?: No Is the patient deaf or have difficulty hearing?: No Does the patient have difficulty seeing, even when wearing glasses/contacts?: No Does the patient have difficulty concentrating, remembering, or making decisions?: No  Permission Sought/Granted                  Emotional Assessment              Admission diagnosis:   Lymphedema [I89.0] Cellulitis of lower extremity, unspecified laterality [L03.119] Patient Active Problem List   Diagnosis Date Noted   Lymphedema 10/08/2023   Bilateral lower leg cellulitis 06/30/2023   Uncontrolled type 2 diabetes mellitus with hyperglycemia (HCC) 05/20/2023   OSA (obstructive sleep apnea) 09/02/2022   Atrial fibrillation (HCC) 07/31/2022   Mobility impaired 06/19/2022   Hordeolum externum of left upper eyelid 02/20/2022   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   Long-term use of immunosuppressant medication 12/17/2021   Lumbar degenerative disc disease 12/17/2021   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/2021   Polyarthritis 10/26/2021   Foot deformity, acquired, left 10/25/2021   Rash 10/25/2021   Osteopenia after menopause 10/25/2021   Pitting edema 10/25/2021   Mass of breast, left 08/08/2021   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   Numbness and tingling in right hand 04/25/2021   Mild protein-calorie malnutrition (HCC) 08/16/2020   OA (osteoarthritis) of finger, right 04/05/2020   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   Candidiasis of skin 06/28/2017   IBS (irritable bowel syndrome) 06/28/2017  Screening for colon cancer 12/28/2016   Diarrhea in adult patient 09/04/2016   Dizziness 05/24/2016   Anxiety, generalized 02/20/2016   Arthritis    Acquired hypothyroidism    History of CVA (cerebrovascular accident)    Status post bilateral hip replacements    Status post right knee replacement    Degenerative lumbar spinal stenosis    Essential hypertension, benign 07/25/2015   Preoperative clearance 07/25/2015   Hyperlipidemia associated with type 2 diabetes mellitus (HCC) 07/25/2015   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 [place] [address] Phone: [phone] Fax: [phone]  [facility] [place], [place] - [address] [facility] [address] [employer] [address] [place] [address] Phone: [phone] Fax: [phone]     Social Drivers of Health (SDOH) Social History: SDOH Screenings   Food Insecurity: No Food Insecurity (10/08/2023)  Housing: Low Risk  (10/08/2023)  Transportation Needs: No Transportation Needs (10/08/2023)  Utilities: Not At Risk (10/08/2023)  Depression (PHQ2-9): Low Risk  (06/30/2023)  Financial Resource Strain: Low Risk  (09/03/2023)   Received from [facility]  Physical Activity: Unknown (03/22/2022)  Social Connections: Moderately Isolated (10/08/2023)  Stress: No Stress Concern Present (03/22/2022)  Tobacco Use: Low Risk  (10/07/2023)   SDOH Interventions:     Readmission Risk Interventions     No data to display

## 2023-10-10 NOTE — Plan of Care (Signed)

## 2023-10-12 LAB — CULTURE, BLOOD (ROUTINE X 2)
Culture: NO GROWTH
Culture: NO GROWTH
Special Requests: ADEQUATE

## 2023-10-13 ENCOUNTER — Telehealth: Payer: Self-pay

## 2023-10-13 NOTE — Transitions of Care (Post Inpatient/ED Visit) (Signed)
 10/13/2023  Name: Bonnie Hinton MRN: [id number] DOB: [birth year]  Today's TOC FU Call Status: Today's TOC FU Call Status:: Successful TOC FU Call Completed TOC FU Call Complete Date: 10/13/23 Patient's Name and Date of Birth confirmed.  Transition Care Management Follow-up Telephone Call Date of Discharge: 10/10/23 Discharge Facility: [facility]) Type of Discharge: Inpatient Admission Primary Inpatient Discharge Diagnosis:: lymphedema How have you been since you were released from the hospital?: Better Any questions or concerns?: No  Items Reviewed: Did you receive and understand the discharge instructions provided?: Yes Medications obtained,verified, and reconciled?: Yes (Medications Reviewed) Any new allergies since your discharge?: No Dietary orders reviewed?: Yes Do you have support at home?: No  Medications Reviewed Today: Medications Reviewed Today     Reviewed by Emmitt Pan, LPN (Licensed Practical Nurse) on 10/13/23 at 1434  Med List Status: <None>   Medication Order Taking? Sig Documenting Provider Last Dose Status Informant  acetaminophen  (TYLENOL ) 325 MG tablet [id number] Yes Take 2 tablets (650 mg total) by mouth every 6 (six) hours as needed for mild pain (or Fever >/= 101). Shepperson, Kirstin, PA-C  Active Self  amLODipine  (NORVASC ) 10 MG tablet [id number] Yes Take 1 tablet (10 mg total) by mouth daily. Tullo, Teresa L, MD  Active   cephALEXin  (KEFLEX ) 500 MG capsule [id number] Yes Take 1 capsule (500 mg total) by mouth 4 (four) times daily for 5 days. Barbarann Nest, MD  Active   Cholecalciferol (VITAMIN D3) 2000 units TABS [id number] Yes Take 1 tablet by mouth daily. [provider]  Active   Continuous Glucose Sensor (FREESTYLE LIBRE 2 SENSOR) [place] [id number] Yes USE AS DIRECTED. CHANGE SENSOR EVERY 14 DAYS Marylynn Verneita CROME, MD  Active   cyanocobalamin  (VITAMIN B12) 1000 MCG/ML injection [id number] Yes Inject 1 mL  (1,000 mcg total) into the muscle every 30 (thirty) days. Marylynn Verneita CROME, MD  Active   cycloSPORINE  (RESTASIS ) 0.05 % ophthalmic emulsion [id number] Yes Place 1 drop into both eyes 2 (two) times daily. [provider]  Active Self  dicyclomine  (BENTYL ) 20 MG tablet [id number] Yes TAKE ONE TABLET BY MOUTH FOUR TIMES A DAY, BEFORE MEALS AND AT BEDTIME Marylynn Verneita CROME, MD  Active   ezetimibe  (ZETIA ) 10 MG tablet [id number] Yes Take 1 tablet (10 mg total) by mouth daily. Marylynn Verneita CROME, MD  Active   folic acid  (FOLVITE ) 1 MG tablet [id number] Yes Take 1 mg by mouth daily. [provider]  Active   furosemide  (LASIX ) 20 MG tablet [id number] Yes TAKE ONE TABLET BY MOUTH EVERY DAY Marylynn Verneita CROME, MD  Active   hydroxychloroquine  (PLAQUENIL ) 200 MG tablet [id number] Yes Take 200 mg by mouth 2 (two) times daily. [provider]  Active   LANTUS  SOLOSTAR 100 UNIT/ML Solostar Pen [id number] Yes INJECT 25 UNITS UNDER THE SKIN DAILY AS DIRECTED *INCREASE AS DIRECTED BY PHYSICIAN* Marylynn Verneita CROME, MD  Active   methotrexate  (RHEUMATREX) 2.5 MG tablet [id number] Yes Take 20 mg by mouth once a week. [provider]  Active            Med Note SIGRID, TIFFANY N   Wed Oct 08, 2023 12:55 AM) Dose change to 8 tablets weekly as of 07/17/23 per clinical summary.  metoprolol  tartrate (LOPRESSOR ) 50 MG tablet [id number] Yes TAKE ONE TABLET BY MOUTH TWICE A DAY Marylynn Verneita CROME, MD  Active   mupirocin  ointment (BACTROBAN ) 2 % [id number] Yes APPLY TO AFFECTED AREA TWICE A DAY Marylynn Verneita  L, MD  Active   nystatin  cream (MYCOSTATIN ) [id number] Yes APPLY TO THE AFFECTED AREA(S) TWICE DAILY Marylynn Verneita CROME, MD  Active   pantoprazole  (PROTONIX ) 40 MG tablet [id number] Yes Take 1 tablet (40 mg total) by mouth daily. Marylynn Verneita CROME, MD  Active   spironolactone  (ALDACTONE ) 50 MG tablet [id number] Yes TAKE ONE TABLET BY MOUTH EVERY DAY Marylynn Verneita CROME, MD  Active   SYNTHROID  150 MCG tablet [id number] Yes TAKE  ONE TABLET BY MOUTH EVERY DAY Marylynn Verneita CROME, MD  Active   tiZANidine  (ZANAFLEX ) 4 MG tablet [id number] Yes TAKE 1 TABLET BY MOUTH EVERY 6 HOURS AS NEEDED FOR MUSCLE SPASMS Marylynn Verneita CROME, MD  Active   triamcinolone  cream-eucerin [id number] Yes Apply topically 2 (two) times daily. Barbarann Nest, MD  Active   trospium  (SANCTURA ) 20 MG tablet [id number] Yes TAKE ONE TABLET BY MOUTH TWICE A DAY Marylynn Verneita CROME, MD  Active   XARELTO  20 MG TABS tablet [id number] Yes TAKE ONE TABLET BY MOUTH EVERY DAY WITH SUPPER Marylynn Verneita CROME, MD  Active             Home Care and Equipment/Supplies: Were Home Health Services Ordered?: NA Any new equipment or medical supplies ordered?: NA  Functional Questionnaire: Do you need assistance with bathing/showering or dressing?: No Do you need assistance with meal preparation?: No Do you need assistance with eating?: No Do you have difficulty maintaining continence: No Do you need assistance with getting out of bed/getting out of a chair/moving?: No Do you have difficulty managing or taking your medications?: No  Follow up appointments reviewed: PCP Follow-up appointment confirmed?: No (sent message to staff to schedule) MD Provider Line Number:[phone] Given: No Specialist Hospital Follow-up appointment confirmed?: Yes Date of Specialist follow-up appointment?: 11/04/23 Follow-Up Specialty Provider:: vas Do you need transportation to your follow-up appointment?: No Do you understand care options if your condition(s) worsen?: Yes-patient verbalized understanding    SIGNATURE Julian Lemmings, LPN [facility] Nurse Health Advisor Direct Dial  [phone]

## 2023-10-14 ENCOUNTER — Other Ambulatory Visit: Payer: Self-pay | Admitting: Internal Medicine

## 2023-10-14 ENCOUNTER — Other Ambulatory Visit: Payer: Self-pay

## 2023-10-14 ENCOUNTER — Telehealth: Payer: Self-pay

## 2023-10-14 DIAGNOSIS — E1165 Type 2 diabetes mellitus with hyperglycemia: Secondary | ICD-10-CM

## 2023-10-14 MED ORDER — FREESTYLE LIBRE 2 SENSOR MISC: 3 refills | Status: AC

## 2023-10-14 NOTE — Telephone Encounter (Signed)
 Copied from CRM [id number]. Topic: Appointments - Appointment Scheduling >> Oct 14, 2023 10:50 AM Charolett L wrote: Patient/patient representative is calling to schedule an appointment. Patient needs a wheel chair to come and get her from the car because her legs are so swollen and she can't walk that far to the door for her appt on 07/28

## 2023-10-14 NOTE — Telephone Encounter (Signed)
 Noted we will bring the wheelchair when she gets to the office.

## 2023-10-14 NOTE — Telephone Encounter (Signed)
 Patient doe shave CGM currently wears with monitor, not on cell advised patient to bring to visit on 10/20/23 and that patient needs to be here about 15 to 20  minutes early to get into building safely for appt. Patient will  be met at car with wheelchair and say she can pivot from car to chair unassisted and request no assist will call on arrival for wheelchair. Okay to get labs while here due to risk of patient trying to mange getting in and out of car multiple times.

## 2023-10-20 ENCOUNTER — Other Ambulatory Visit: Payer: Self-pay

## 2023-10-20 ENCOUNTER — Ambulatory Visit: Admitting: Internal Medicine

## 2023-10-20 ENCOUNTER — Encounter: Payer: Self-pay | Admitting: Internal Medicine

## 2023-10-20 VITALS — BP 136/72 | HR 85 | Ht 68.5 in | Wt 330.0 lb

## 2023-10-20 DIAGNOSIS — E785 Hyperlipidemia, unspecified: Secondary | ICD-10-CM

## 2023-10-20 DIAGNOSIS — I10 Essential (primary) hypertension: Secondary | ICD-10-CM

## 2023-10-20 DIAGNOSIS — I872 Venous insufficiency (chronic) (peripheral): Secondary | ICD-10-CM | POA: Diagnosis not present

## 2023-10-20 DIAGNOSIS — Z993 Dependence on wheelchair: Secondary | ICD-10-CM

## 2023-10-20 DIAGNOSIS — E1165 Type 2 diabetes mellitus with hyperglycemia: Secondary | ICD-10-CM

## 2023-10-20 DIAGNOSIS — Z794 Long term (current) use of insulin: Secondary | ICD-10-CM

## 2023-10-20 DIAGNOSIS — Z09 Encounter for follow-up examination after completed treatment for conditions other than malignant neoplasm: Secondary | ICD-10-CM

## 2023-10-20 DIAGNOSIS — I890 Lymphedema, not elsewhere classified: Secondary | ICD-10-CM

## 2023-10-20 DIAGNOSIS — E1169 Type 2 diabetes mellitus with other specified complication: Secondary | ICD-10-CM

## 2023-10-20 MED ORDER — TRIAMCINOLONE ACETONIDE 0.1 % EX CREA
TOPICAL_CREAM | Freq: Two times a day (BID) | CUTANEOUS | 1 refills | Status: AC
  Filled 2023-10-20: qty 480, 16d supply, fill #0

## 2023-10-20 MED ORDER — AMLODIPINE BESYLATE 10 MG PO TABS: 10.0000 mg | ORAL_TABLET | Freq: Every day | ORAL | 1 refills | Status: DC

## 2023-10-20 MED ORDER — NYSTATIN 100000 UNIT/GM EX CREA: TOPICAL_CREAM | Freq: Two times a day (BID) | CUTANEOUS | 2 refills | Status: AC

## 2023-10-20 MED ORDER — EZETIMIBE 10 MG PO TABS: 10.0000 mg | ORAL_TABLET | Freq: Every day | ORAL | 1 refills | Status: DC

## 2023-10-20 MED ORDER — PANTOPRAZOLE SODIUM 40 MG PO TBEC: 40.0000 mg | DELAYED_RELEASE_TABLET | Freq: Every day | ORAL | 1 refills | Status: DC

## 2023-10-20 NOTE — Assessment & Plan Note (Signed)
 Referring to vascular surgery for lymphedema pumps.

## 2023-10-20 NOTE — Patient Instructions (Addendum)
 Increase dose of LANTUS   to 35 units daily.    Check your sugars AT LEAST 4 TIMES DAILY:  FASTING WHEN YOU WAKE UP  2 HOURS AFTER EVERY MEAL  IF YOU DO NOT EAT A MEAL  PLEASE CHECK IT ANYWAY,  I NEED THE DATA  IDEALLY  CHECK ANOTHER READING RIGHT BEFORE EACH MEAL  SO YOU CAN SEE WHICH FOODS /MEALS ARE MAKING YOUR SUGARS SO HIGH   I WILL LOOK INTO GETTING A LOCAL PHARMACY TO REFILL THE TRIAMCINOLONE /EUCERIN CREAM ;IT HAS TO BE COMPOUNDED

## 2023-10-20 NOTE — Assessment & Plan Note (Signed)
 INCREASE LANTUS  TO 35 UNITS.  CANNOT ADJUST AND FURTHER UNTIL MORE DATA IS ABLE TO BE EXTRACTED FROM HER CBG MONITOR

## 2023-10-20 NOTE — Assessment & Plan Note (Signed)
 Continue use of triamcinolone  0.1%/Eucerin compounded cream.  Refilled in a larger quantity trhough [facility] pharmacy

## 2023-10-20 NOTE — Progress Notes (Unsigned)
 Subjective:  Patient ID: Bonnie Hinton, female    DOB: 12-12-1940  Age: 83 y.o. MRN: [id number]  CC: The primary encounter diagnosis was Essential hypertension, benign. Diagnoses of Hyperlipidemia associated with type 2 diabetes mellitus (HCC), Uncontrolled type 2 diabetes mellitus with hyperglycemia (HCC), Stasis dermatitis of both legs, and Lymphedema were also pertinent to this visit.   HPI Bonnie Hinton presents for  Chief Complaint  Patient presents with   Medical Management of Chronic Issues    Hospital follow up    chanese is an 83 yr old female with type 2 DM/, rheumatoid arthritis , atrial fibrillation ,  morbid obesity/ and chronic LE edema who was admitted to [facility] on July 15 with cellulitis of lower extremities that did not respond to multiple rounds of empiric oral antibiotics.  She was treated with IV ANCEF .  Blood cultures were negative.  She was  diagnosed with lymphedema complicated by cellulitis and venous stasis dermatitis.  Discharged on keflex  until July 23 and TAC Eucerin . She completed the antibiotics but reports that she had d stomach problems (  loose stools  anorexia) on July 24-25  with  dry heaves and  diarrhea  that lasted  24 hours .  The symptoms resolved by end of July 26    Has wound clinic appt August 5   2) type 2 DM:  she has been prescribed lantus  25 units.   I have downloaded and reviewed the data from patient's continuous blood glucose monitor  for  the period  of    July 15 to July 28.data is lacking fro more than 7 days.   Patient's  sugars have been  IN RANGE   26  % OF THE TIME,   ABOVE RANGE  74  % of the time. .  Medications currently used for glycemic control are lantus  .  25 but has increased  dose to 30 units daily  for CBG > 250  Patient reports compliance with medication approximately    % of the tine.   No data collected  until July 20th ; and only 2 full days of data collected.  On the other 5 days.  \  She is using   the  TAC Eucerin cream sparing    Outpatient Medications Prior to Visit  Medication Sig Dispense Refill   acetaminophen  (TYLENOL ) 325 MG tablet Take 2 tablets (650 mg total) by mouth every 6 (six) hours as needed for mild pain (or Fever >/= 101).     Cholecalciferol (VITAMIN D3) 2000 units TABS Take 1 tablet by mouth daily.     Continuous Glucose Sensor (FREESTYLE LIBRE 2 SENSOR) MISC USE AS DIRECTED. CHANGE SENSOR EVERY 14 DAYS 6 each 3   cyanocobalamin  (VITAMIN B12) 1000 MCG/ML injection Inject 1 mL (1,000 mcg total) into the muscle every 30 (thirty) days. 3 mL 4   cycloSPORINE  (RESTASIS ) 0.05 % ophthalmic emulsion Place 1 drop into both eyes 2 (two) times daily.     dicyclomine  (BENTYL ) 20 MG tablet TAKE ONE TABLET BY MOUTH FOUR TIMES A DAY, BEFORE MEALS AND AT BEDTIME 360 tablet 2   furosemide  (LASIX ) 20 MG tablet TAKE ONE TABLET BY MOUTH EVERY DAY 90 tablet 3   hydroxychloroquine  (PLAQUENIL ) 200 MG tablet Take 200 mg by mouth 2 (two) times daily.     LANTUS  SOLOSTAR 100 UNIT/ML Solostar Pen INJECT 25 UNITS UNDER THE SKIN DAILY AS DIRECTED *INCREASE AS DIRECTED BY PHYSICIAN* 30 mL 10  methotrexate  (RHEUMATREX) 2.5 MG tablet Take 20 mg by mouth once a week.     metoprolol  tartrate (LOPRESSOR ) 50 MG tablet TAKE ONE TABLET BY MOUTH TWICE A DAY 180 tablet 1   mupirocin  ointment (BACTROBAN ) 2 % APPLY TO AFFECTED AREA TWICE A DAY 22 g 11   spironolactone  (ALDACTONE ) 50 MG tablet TAKE ONE TABLET BY MOUTH EVERY DAY 90 tablet 3   SYNTHROID  150 MCG tablet TAKE ONE TABLET BY MOUTH EVERY DAY 90 tablet 3   tiZANidine  (ZANAFLEX ) 4 MG tablet TAKE 1 TABLET BY MOUTH EVERY 6 HOURS AS NEEDED FOR MUSCLE SPASMS 270 tablet 3   triamcinolone  cream (KENALOG ) 0.1 % Apply 1 Application topically 2 (two) times daily.     trospium  (SANCTURA ) 20 MG tablet TAKE ONE TABLET BY MOUTH TWICE A DAY 180 tablet 2   XARELTO  20 MG TABS tablet TAKE ONE TABLET BY MOUTH EVERY DAY WITH SUPPER 90 tablet 1   amLODipine  (NORVASC ) 10 MG  tablet Take 1 tablet (10 mg total) by mouth daily. 90 tablet 1   ezetimibe  (ZETIA ) 10 MG tablet Take 1 tablet (10 mg total) by mouth daily. 90 tablet 1   nystatin  cream (MYCOSTATIN ) APPLY TO THE AFFECTED AREA(S) TWICE DAILY 90 g 2   pantoprazole  (PROTONIX ) 40 MG tablet Take 1 tablet (40 mg total) by mouth daily. 90 tablet 1   folic acid  (FOLVITE ) 1 MG tablet Take 1 mg by mouth daily. (Patient not taking: Reported on 10/20/2023)     triamcinolone  cream-eucerin Apply topically 2 (two) times daily. 60 g 0   No facility-administered medications prior to visit.    Review of Systems;  Patient denies headache, fevers, malaise, unintentional weight loss, skin rash, eye pain, sinus congestion and sinus pain, sore throat, dysphagia,  hemoptysis , cough, dyspnea, wheezing, chest pain, palpitations, orthopnea, edema, abdominal pain, nausea, melena, diarrhea, constipation, flank pain, dysuria, hematuria, urinary  Frequency, nocturia, numbness, tingling, seizures,  Focal weakness, Loss of consciousness,  Tremor, insomnia, depression, anxiety, and suicidal ideation.      Objective:  BP 136/72   Pulse 85   Ht 5' 8.5 (1.74 m)   Wt (!) 330 lb (149.7 kg)   SpO2 96%   BMI 49.45 kg/m   BP Readings from Last 3 Encounters:  10/20/23 136/72  10/10/23 (!) 125/53  09/30/23 120/60    Wt Readings from Last 3 Encounters:  10/20/23 (!) 330 lb (149.7 kg)  10/07/23 (!) 330 lb (149.7 kg)  06/30/23 (!) 317 lb (143.8 kg)    Physical Exam Vitals reviewed.  Constitutional:      General: She is not in acute distress.    Appearance: Normal appearance. She is normal weight. She is not ill-appearing, toxic-appearing or diaphoretic.  HENT:     Head: Normocephalic.  Eyes:     General: No scleral icterus.       Right eye: No discharge.        Left eye: No discharge.     Conjunctiva/sclera: Conjunctivae normal.  Cardiovascular:     Rate and Rhythm: Normal rate and regular rhythm.     Heart sounds: Normal heart  sounds.  Pulmonary:     Effort: Pulmonary effort is normal. No respiratory distress.     Breath sounds: Normal breath sounds.  Musculoskeletal:        General: Normal range of motion.  Skin:    General: Skin is warm and dry.     Findings: Erythema and rash present. Rash is crusting.  Comments: LYMPHEDEMA WITH VENOUS STASIS DERMATITIS BILATERALLY   Neurological:     General: No focal deficit present.     Mental Status: She is alert and oriented to person, place, and time. Mental status is at baseline.  Psychiatric:        Mood and Affect: Mood normal.        Behavior: Behavior normal.        Thought Content: Thought content normal.        Judgment: Judgment normal.     Lab Results  Component Value Date   HGBA1C 8.6 (H) 04/24/2023   HGBA1C 8.2 (A) 04/24/2023   HGBA1C 7.4 (H) 10/28/2022    Lab Results  Component Value Date   CREATININE 0.86 10/10/2023   CREATININE 0.90 10/09/2023   CREATININE 0.78 10/08/2023    Lab Results  Component Value Date   WBC 8.4 10/08/2023   HGB 11.7 (L) 10/08/2023   HCT 36.4 10/08/2023   PLT 349 10/08/2023   GLUCOSE 196 (H) 10/10/2023   CHOL 197 04/24/2023   TRIG 140.0 04/24/2023   HDL 44.90 04/24/2023   LDLDIRECT 139.0 04/24/2023   LDLCALC 124 (H) 04/24/2023   ALT 12 10/07/2023   AST 18 10/07/2023   NA 135 10/10/2023   K 4.4 10/10/2023   CL 98 10/10/2023   CREATININE 0.86 10/10/2023   BUN 13 10/10/2023   CO2 24 10/10/2023   TSH 3.24 04/24/2023   INR 1.6 (H) 10/07/2023   HGBA1C 8.6 (H) 04/24/2023    No results found.  Assessment & Plan:  .Essential hypertension, benign -     Comprehensive metabolic panel with GFR -     Microalbumin / creatinine urine ratio  Hyperlipidemia associated with type 2 diabetes mellitus (HCC) -     Lipid panel -     LDL cholesterol, direct  Uncontrolled type 2 diabetes mellitus with hyperglycemia (HCC) Assessment & Plan: INCREASE LANTUS  TO 35 UNITS.  CANNOT ADJUST AND FURTHER UNTIL MORE DATA  IS ABLE TO BE EXTRACTED FROM HER CBG MONITOR   Orders: -     Comprehensive metabolic panel with GFR -     Hemoglobin A1c -     Microalbumin / creatinine urine ratio  Stasis dermatitis of both legs Assessment & Plan: Continue use of triamcinolone  0.1%/Eucerin compounded cream.  Refilled in a larger quantity trhough [facility] pharmacy    Lymphedema Assessment & Plan: Referring to vascular surgery for lymphedema pumps.   Orders: -     Ambulatory referral to Vascular Surgery  Other orders -     amLODIPine  Besylate; Take 1 tablet (10 mg total) by mouth daily.  Dispense: 90 tablet; Refill: 1 -     Ezetimibe ; Take 1 tablet (10 mg total) by mouth daily.  Dispense: 90 tablet; Refill: 1 -     Nystatin ; Apply topically 2 (two) times daily. to affected area(s)  Dispense: 90 g; Refill: 2 -     Pantoprazole  Sodium; Take 1 tablet (40 mg total) by mouth daily.  Dispense: 90 tablet; Refill: 1     I spent 34 minutes on the day of this face to face encounter reviewing patient's  most recent visit with cardiology,  nephrology,  and neurology,  prior relevant surgical and non surgical procedures, recent  labs and imaging studies, counseling on weight management,  reviewing the assessment and plan with patient, and post visit ordering and reviewing of  diagnostics and therapeutics with patient  .   Follow-up: Return in about 4 weeks (  around 11/17/2023) for follow up diabetes.   Verneita LITTIE Kettering, MD

## 2023-10-21 ENCOUNTER — Other Ambulatory Visit: Payer: Self-pay

## 2023-10-21 ENCOUNTER — Other Ambulatory Visit

## 2023-10-21 ENCOUNTER — Encounter

## 2023-10-21 DIAGNOSIS — Z09 Encounter for follow-up examination after completed treatment for conditions other than malignant neoplasm: Secondary | ICD-10-CM | POA: Insufficient documentation

## 2023-10-21 LAB — LIPID PANEL
Cholesterol: 233 mg/dL — ABNORMAL HIGH (ref 0–200)
HDL: 49.4 mg/dL (ref >39.00)
LDL Cholesterol: 123 mg/dL — ABNORMAL HIGH (ref 0–99)
NonHDL: 183.91
Total CHOL/HDL Ratio: 5
Triglycerides: 304 mg/dL — ABNORMAL HIGH (ref 0.0–149.0)
VLDL: 60.8 mg/dL — ABNORMAL HIGH (ref 0.0–40.0)

## 2023-10-21 LAB — COMPREHENSIVE METABOLIC PANEL WITH GFR
ALT: 11 U/L (ref 0–35)
AST: 19 U/L (ref 0–37)
Albumin: 4.4 g/dL (ref 3.5–5.2)
Alkaline Phosphatase: 83 U/L (ref 39–117)
BUN: 14 mg/dL (ref 6–23)
CO2: 27 meq/L (ref 19–32)
Calcium: 9.5 mg/dL (ref 8.4–10.5)
Chloride: 96 meq/L (ref 96–112)
Creatinine, Ser: 0.9 mg/dL (ref 0.40–1.20)
GFR: 59.42 mL/min — ABNORMAL LOW (ref >60.00)
Glucose, Bld: 131 mg/dL — ABNORMAL HIGH (ref 70–99)
Potassium: 4.3 meq/L (ref 3.5–5.1)
Sodium: 136 meq/L (ref 135–145)
Total Bilirubin: 0.4 mg/dL (ref 0.2–1.2)
Total Protein: 7.6 g/dL (ref 6.0–8.3)

## 2023-10-21 LAB — MICROALBUMIN / CREATININE URINE RATIO
Creatinine,U: 52 mg/dL
Microalb Creat Ratio: 14 mg/g (ref 0.0–30.0)
Microalb, Ur: 0.7 mg/dL (ref 0.0–1.9)

## 2023-10-21 LAB — HEMOGLOBIN A1C: Hgb A1c MFr Bld: 9.1 % — ABNORMAL HIGH (ref 4.6–6.5)

## 2023-10-21 LAB — LDL CHOLESTEROL, DIRECT: Direct LDL: 142 mg/dL

## 2023-10-21 NOTE — Assessment & Plan Note (Signed)
 Patient is stable post discharge and has no new issues or questions about her discharge plans,  all appropriate meds have been refilled and referrals made to lymphedema clinic.  Appoint,ent confirmed with Wound clinic

## 2023-10-21 NOTE — Assessment & Plan Note (Signed)
 SECONDARY TO CHRONIC KNEE PAIN DESPITE KNEE REPLACEMENT AND DECONDITIONING/MORBID OBESITY.  She was able to transfer from driver's seat to wheelchair with minimal assistance today (during previous visit she required maximal assistance from 3 staff members)

## 2023-10-22 ENCOUNTER — Other Ambulatory Visit: Payer: Self-pay

## 2023-10-22 ENCOUNTER — Encounter: Payer: Self-pay | Admitting: Occupational Therapy

## 2023-10-22 ENCOUNTER — Ambulatory Visit: Payer: Self-pay | Admitting: Internal Medicine

## 2023-10-22 ENCOUNTER — Ambulatory Visit: Attending: Internal Medicine | Admitting: Occupational Therapy

## 2023-10-22 DIAGNOSIS — I890 Lymphedema, not elsewhere classified: Secondary | ICD-10-CM | POA: Insufficient documentation

## 2023-10-22 NOTE — Therapy (Signed)
 [facility] Outpatient Rehabilitation at [facility] [address] [place], [place], [address] Phone: [phone]   Fax:  [phone]  Patient Details  Name: Bonnie Hinton MRN: [id number] Date of Birth: 15-Jul-1940 Referring Provider:  Marylynn Verneita CROME, MD  Encounter Date: 10/22/2023  Bonnie Hinton is referred to Occupational Therapy for evaluation and treatment of BLE lymphedema. Pt reports long standing bilateral leg swelling and recent exacerbation concurrently with hospitalization for BLE cellulitis. Pt explains that she is not sure why she is here today, and reports she is receiving PT services at home.  Medicare does not typically cover home health and outpatient rehabilitation services simultaneously because the criteria for each require distinct circumstances. Home health care is designed for individuals who are homebound due to their medical condition, while outpatient rehabilitation is for those who can travel to a facility for therapy. Medicare generally covers the costs for home health skilled nursing services, physical, speech, and occupational therapy sessions when a patient is homebound. Outpatient therapy is covered when the patient can travel to a therapist's office or rehabilitation agency.    Evaluation not completed today and no tretament planned. No charge for today's visit as Pt was unaware of these rules.   Zebedee CROME Dec, OTR/L, CLT-LANA 10/22/2023, 1:46 PM  [facility] Outpatient Rehabilitation at [facility] [address] [place], [place], [address] Phone: [phone]   Fax:  [phone]

## 2023-10-23 NOTE — Telephone Encounter (Signed)
 Copied from CRM [id number]. Topic: General - Other >> Oct 23, 2023 11:38 AM Burnard DEL wrote: Reason for CRM: Patient returned call to [facility] stated that she is going to write down everything that she eats and her sugar readings for when she come in to see Dr Marylynn in [place] stated that she think this will be a good idea to keep track for Dr Marylynn.

## 2023-10-24 ENCOUNTER — Other Ambulatory Visit: Payer: Self-pay

## 2023-10-28 ENCOUNTER — Encounter: Attending: Physician Assistant | Admitting: Physician Assistant

## 2023-10-28 DIAGNOSIS — E11628 Type 2 diabetes mellitus with other skin complications: Secondary | ICD-10-CM | POA: Insufficient documentation

## 2023-10-28 DIAGNOSIS — Z8673 Personal history of transient ischemic attack (TIA), and cerebral infarction without residual deficits: Secondary | ICD-10-CM | POA: Diagnosis not present

## 2023-10-28 DIAGNOSIS — Z7901 Long term (current) use of anticoagulants: Secondary | ICD-10-CM | POA: Insufficient documentation

## 2023-10-28 DIAGNOSIS — I890 Lymphedema, not elsewhere classified: Secondary | ICD-10-CM | POA: Diagnosis not present

## 2023-10-28 DIAGNOSIS — I480 Paroxysmal atrial fibrillation: Secondary | ICD-10-CM | POA: Insufficient documentation

## 2023-10-28 DIAGNOSIS — I10 Essential (primary) hypertension: Secondary | ICD-10-CM | POA: Insufficient documentation

## 2023-11-04 ENCOUNTER — Encounter: Admitting: Physician Assistant

## 2023-11-04 DIAGNOSIS — E11628 Type 2 diabetes mellitus with other skin complications: Secondary | ICD-10-CM | POA: Diagnosis not present

## 2023-11-05 ENCOUNTER — Other Ambulatory Visit: Payer: Self-pay | Admitting: Internal Medicine

## 2023-11-12 ENCOUNTER — Encounter: Admitting: Physician Assistant

## 2023-11-17 ENCOUNTER — Telehealth: Payer: Self-pay | Admitting: *Deleted

## 2023-11-17 ENCOUNTER — Ambulatory Visit

## 2023-11-17 NOTE — Telephone Encounter (Addendum)
 Called patient to perform AWV. Patient stated that she was not feeling well and wanted to reschedule. Patient stated that she started with symptoms about a week ago and thought that it was allergies. Patient stated that she has had a productive cough, clear to brown to green. Patient complained of SOB and difficulty breathing.  Patient was having difficulty breathing while on the phone but stated that she had just finished washing up and that was the reason. Patient has a bad cough which was going on during the telephone call. Patient stated that she has an appointment scheduled with Dr. Tullo Wednesday. Patient was advised that she should not wait until Wednesday to be seen.  Patient stated that this has been going on for a week and what is two more days. No appointments available at the office today. Patient declined to go to an Urgent Care insisting that she was okay to wait until Wednesday. Patient stated that she tried to get into her car Thursday or Friday to go to get something to eat and her legs just gave out on her. Patient stated the [employer] Department came out and helped her get up.   Patient was advised that a message was being sent to Dr. Tullo for her to be aware of what was going on with her.

## 2023-11-17 NOTE — Telephone Encounter (Signed)
 Spoke with pt and she stated that she told Angeline to leave me alone I have an appointment on Wednesday and not coming in sooner. I explained to pt that with the symptoms that she is having it is important that she be evaluated because she could have developed pneumonia. Pt stated that she was just in the ED and she is not going back and she will see Dr. Marylynn on Wednesday.

## 2023-11-19 ENCOUNTER — Ambulatory Visit: Admitting: Internal Medicine

## 2023-11-19 ENCOUNTER — Encounter: Payer: Self-pay | Admitting: Internal Medicine

## 2023-11-19 VITALS — BP 138/68 | HR 88 | Ht 68.5 in | Wt 330.0 lb

## 2023-11-19 DIAGNOSIS — J45901 Unspecified asthma with (acute) exacerbation: Secondary | ICD-10-CM | POA: Diagnosis not present

## 2023-11-19 DIAGNOSIS — Z993 Dependence on wheelchair: Secondary | ICD-10-CM

## 2023-11-19 DIAGNOSIS — G4733 Obstructive sleep apnea (adult) (pediatric): Secondary | ICD-10-CM | POA: Diagnosis not present

## 2023-11-19 DIAGNOSIS — J440 Chronic obstructive pulmonary disease with acute lower respiratory infection: Secondary | ICD-10-CM

## 2023-11-19 DIAGNOSIS — E1165 Type 2 diabetes mellitus with hyperglycemia: Secondary | ICD-10-CM

## 2023-11-19 DIAGNOSIS — J209 Acute bronchitis, unspecified: Secondary | ICD-10-CM

## 2023-11-19 DIAGNOSIS — Z794 Long term (current) use of insulin: Secondary | ICD-10-CM

## 2023-11-19 MED ORDER — SYRINGE 25G X 1" 3 ML MISC: 0 refills | Status: DC

## 2023-11-19 MED ORDER — PREDNISONE 10 MG PO TABS
ORAL_TABLET | ORAL | 0 refills | Status: DC
Start: 2023-11-19 — End: 2023-12-15

## 2023-11-19 MED ORDER — DOXYCYCLINE HYCLATE 100 MG PO TABS: 100.0000 mg | ORAL_TABLET | Freq: Two times a day (BID) | ORAL | 0 refills | Status: DC

## 2023-11-19 MED ORDER — ALBUTEROL SULFATE (2.5 MG/3ML) 0.083% IN NEBU
2.5000 mg | INHALATION_SOLUTION | Freq: Once | RESPIRATORY_TRACT | Status: AC
  Administered 2023-11-19: 2.5 mg via RESPIRATORY_TRACT

## 2023-11-19 MED ORDER — ALBUTEROL SULFATE HFA 108 (90 BASE) MCG/ACT IN AERS
2.0000 | INHALATION_SPRAY | Freq: Four times a day (QID) | RESPIRATORY_TRACT | 11 refills | Status: AC | PRN
Start: 2023-11-19 — End: ?

## 2023-11-19 MED ORDER — METHYLPREDNISOLONE ACETATE 40 MG/ML IJ SUSP
40.0000 mg | Freq: Once | INTRAMUSCULAR | Status: AC
Start: 2023-11-19 — End: 2023-11-19
  Administered 2023-11-19: 40 mg via INTRAMUSCULAR

## 2023-11-19 MED ORDER — RIVAROXABAN 20 MG PO TABS: ORAL_TABLET | ORAL | 1 refills | Status: DC

## 2023-11-19 MED ORDER — PEN NEEDLES 32G X 6 MM MISC: 1 refills | Status: AC

## 2023-11-19 MED ORDER — CYANOCOBALAMIN 1000 MCG/ML IJ SOLN: 1000.0000 ug | INTRAMUSCULAR | 4 refills | Status: DC

## 2023-11-19 NOTE — Progress Notes (Unsigned)
 Subjective:  Patient ID: Bonnie Hinton, female    DOB: January 10, 1941  Age: 83 y.o. MRN: [id number]  CC: There were no encounter diagnoses.   HPI Bonnie Hinton presents for  Chief Complaint  Patient presents with   Medical Management of Chronic Issues    4-6 week follow up    Cough    Cough, chest congestion, SOBr for several weeks.     1) FOLLOW UP ON  TYPE 2 DIABETES:  patiet taking 35 units of lantus  daily and using Freestyle Libre 2 because she does not have a smart phone. Unfortunately she did not bring her reader with her today and only has scribbled BS readings with no relation to meals    She does not feel well today because she has been sick for over a week and refused to go to [facility] or ER     2) COUGH, NON PRODUCTIVE FOR SEVERAL DAYS  STARTED WITH A PRODUCTIVE COUGH STARTED ONE WEEK AGO   NO FEVERS,  RHINITIS OR CONGESTION,   REFUSED TO GO TO ER    3) PATIENT STILL VERY AGGRAVATED BY HER RECENT OBSERVATION STAY  FOR CELLULITIS .  Cannot walk without maximal assistance.    40 venous insufficiency:  reviewe last woudn clinicn onte August. 20.  No cellulitis . Using   velcro compression wraps   5) mobility: paitent unable  to stand for chest x ray, she  required assistance getting out of car into a bariatric wheelchair      Outpatient Medications Prior to Visit  Medication Sig Dispense Refill   acetaminophen  (TYLENOL ) 325 MG tablet Take 2 tablets (650 mg total) by mouth every 6 (six) hours as needed for mild pain (or Fever >/= 101).     amLODipine  (NORVASC ) 10 MG tablet Take 1 tablet (10 mg total) by mouth daily. 90 tablet 1   Cholecalciferol (VITAMIN D3) 2000 units TABS Take 1 tablet by mouth daily.     Continuous Glucose Sensor (FREESTYLE LIBRE 2 SENSOR) MISC USE AS DIRECTED. CHANGE SENSOR EVERY 14 DAYS 6 each 3   cyanocobalamin  (VITAMIN B12) 1000 MCG/ML injection Inject 1 mL (1,000 mcg total) into the muscle every 30 (thirty) days. 3 mL 4   cycloSPORINE   (RESTASIS ) 0.05 % ophthalmic emulsion Place 1 drop into both eyes 2 (two) times daily.     dicyclomine  (BENTYL ) 20 MG tablet TAKE ONE TABLET BY MOUTH FOUR TIMES A DAY, BEFORE MEALS AND AT BEDTIME 360 tablet 2   ezetimibe  (ZETIA ) 10 MG tablet Take 1 tablet (10 mg total) by mouth daily. 90 tablet 1   folic acid  (FOLVITE ) 1 MG tablet Take 1 mg by mouth daily.     furosemide  (LASIX ) 20 MG tablet TAKE ONE TABLET BY MOUTH EVERY DAY 90 tablet 3   hydroxychloroquine  (PLAQUENIL ) 200 MG tablet Take 200 mg by mouth 2 (two) times daily.     LANTUS  SOLOSTAR 100 UNIT/ML Solostar Pen INJECT 25 UNITS UNDER THE SKIN DAILY AS DIRECTED *INCREASE AS DIRECTED BY PHYSICIAN* 30 mL 10   methotrexate  (RHEUMATREX) 2.5 MG tablet Take 20 mg by mouth once a week.     metoprolol  tartrate (LOPRESSOR ) 50 MG tablet TAKE ONE TABLET BY MOUTH TWICE A DAY 180 tablet 1   mupirocin  ointment (BACTROBAN ) 2 % APPLY TO AFFECTED AREA TWICE A DAY 22 g 11   nystatin  cream (MYCOSTATIN ) Apply topically 2 (two) times daily. to affected area(s) 90 g 2   pantoprazole  (PROTONIX ) 40 MG  tablet Take 1 tablet (40 mg total) by mouth daily. 90 tablet 1   spironolactone  (ALDACTONE ) 50 MG tablet TAKE ONE TABLET BY MOUTH EVERY DAY 90 tablet 3   SYNTHROID  150 MCG tablet TAKE ONE TABLET BY MOUTH EVERY DAY 90 tablet 3   tiZANidine  (ZANAFLEX ) 4 MG tablet TAKE 1 TABLET BY MOUTH EVERY 6 HOURS AS NEEDED FOR MUSCLE SPASMS 270 tablet 3   triamcinolone  cream (KENALOG ) 0.1 % Apply 1 Application topically 2 (two) times daily.     triamcinolone  cream in eucerin cream Apply topically 2 (two) times daily. 480 g 1   trospium  (SANCTURA ) 20 MG tablet TAKE ONE TABLET BY MOUTH TWICE A DAY 180 tablet 2   XARELTO  20 MG TABS tablet TAKE ONE TABLET BY MOUTH EVERY DAY WITH SUPPER 90 tablet 1   No facility-administered medications prior to visit.    Review of Systems;  Patient denies headache, fevers, malaise, unintentional weight loss, skin rash, eye pain, sinus congestion  and sinus pain, sore throat, dysphagia,  hemoptysis , cough, dyspnea, wheezing, chest pain, palpitations, orthopnea, edema, abdominal pain, nausea, melena, diarrhea, constipation, flank pain, dysuria, hematuria, urinary  Frequency, nocturia, numbness, tingling, seizures,  Focal weakness, Loss of consciousness,  Tremor, insomnia, depression, anxiety, and suicidal ideation.      Objective:  BP 138/68   Pulse 88   Ht 5' 8.5 (1.74 m)   Wt (!) 330 lb (149.7 kg)   SpO2 96%   BMI 49.45 kg/m   BP Readings from Last 3 Encounters:  11/19/23 138/68  10/20/23 136/72  10/10/23 (!) 125/53    Wt Readings from Last 3 Encounters:  11/19/23 (!) 330 lb (149.7 kg)  10/20/23 (!) 330 lb (149.7 kg)  10/07/23 (!) 330 lb (149.7 kg)    Physical Exam  Lab Results  Component Value Date   HGBA1C 9.1 (H) 10/20/2023   HGBA1C 8.6 (H) 04/24/2023   HGBA1C 8.2 (A) 04/24/2023    Lab Results  Component Value Date   CREATININE 0.90 10/20/2023   CREATININE 0.86 10/10/2023   CREATININE 0.90 10/09/2023    Lab Results  Component Value Date   WBC 8.4 10/08/2023   HGB 11.7 (L) 10/08/2023   HCT 36.4 10/08/2023   PLT 349 10/08/2023   GLUCOSE 131 (H) 10/20/2023   CHOL 233 (H) 10/20/2023   TRIG 304.0 (H) 10/20/2023   HDL 49.40 10/20/2023   LDLDIRECT 142.0 10/20/2023   LDLCALC 123 (H) 10/20/2023   ALT 11 10/20/2023   AST 19 10/20/2023   NA 136 10/20/2023   K 4.3 10/20/2023   CL 96 10/20/2023   CREATININE 0.90 10/20/2023   BUN 14 10/20/2023   CO2 27 10/20/2023   TSH 3.24 04/24/2023   INR 1.6 (H) 10/07/2023   HGBA1C 9.1 (H) 10/20/2023   MICROALBUR 0.7 10/20/2023    No results found.  Assessment & Plan:  .There are no diagnoses linked to this encounter.   I spent 34 minutes on the day of this face to face encounter reviewing patient's  most recent visit with cardiology,  nephrology,  and neurology,  prior relevant surgical and non surgical procedures, recent  labs and imaging studies,  counseling on weight management,  reviewing the assessment and plan with patient, and post visit ordering and reviewing of  diagnostics and therapeutics with patient  .   Follow-up: No follow-ups on file.   Verneita LITTIE Kettering, MD

## 2023-11-19 NOTE — Assessment & Plan Note (Signed)
 Uncontrolled, based on recent A1c and review of incomplete data because  she forgot to bring her  CBG  reader   Advised to  increase lantus  to 40 units.  Will need more data in order to add mealtime insulin  .  Follow up in 2 weeks   Lab Results  Component Value Date   HGBA1C 9.1 (H) 10/20/2023

## 2023-11-19 NOTE — Patient Instructions (Addendum)
 YOU ARE WHEEZING TODAY.  I AM TREATING YOU FOR AN ASTHMA EXACERBATION WITH STEROIDS, ANTIBIOTICS AND INHALED ALBUTEROL    Please GO GET A  chest x ray at [facility] radiology at your earliest convenience    Increase your Lantus  to 40 units daily ..  I need you to Check your sugars 2 hours after meals ,  before bedtime and and fasting before I can make any more changes to your insulin    RETURN IN 2 WEEKS TO REVIEW, OR SEND THEM TO ME VIA [facility]

## 2023-11-20 ENCOUNTER — Ambulatory Visit: Admitting: Internal Medicine

## 2023-11-21 DIAGNOSIS — J209 Acute bronchitis, unspecified: Secondary | ICD-10-CM | POA: Insufficient documentation

## 2023-11-21 NOTE — Assessment & Plan Note (Signed)
 Complicated by bilateral OA  knees,  OSA and type 2 DM  .  She has no contraindications to GLP 1 agonist; will recommend at follow up once her acute issues have resolved.

## 2023-11-21 NOTE — Assessment & Plan Note (Addendum)
 Secondary to deconditioning and morbid obesity post  bilateral hip and bilateral knee replacements.  She continues to live alone against medical advice.

## 2023-11-21 NOTE — Assessment & Plan Note (Signed)
 Diagnosed in 2024 by Sleep study done  (home):  AHI of 10 CPAP offered ; unclear if she is using CPAP

## 2023-11-21 NOTE — Assessment & Plan Note (Signed)
 She is wheezing today but not hypoxis or dyspneic at rest.  Depo Medrol  IM injection given and albuterol  nebulizer with slight improvement in lung exam.  Prednisone  taper and doxycycline  prescribed.  Chest x ray ordered to rule out CAP

## 2023-11-30 IMAGING — US US BREAST*L* LIMITED INC AXILLA
1 series · 13 of 16 positions shown · non-contrast
Comparison: Previous exam(s).

CLINICAL DATA: The patient was called back for 3 adjacent left
breast masses.

EXAM:
DIGITAL DIAGNOSTIC UNILATERAL LEFT MAMMOGRAM WITH TOMOSYNTHESIS AND
CAD; ULTRASOUND LEFT BREAST LIMITED
TECHNIQUE: Left digital diagnostic mammography and breast tomosynthesis was
performed. The images were evaluated with computer-aided detection.;
Targeted ultrasound examination of the left breast was performed.

[Series 1: us breast*left* limited inc axilla · 0.05mm/px · 13 of 16 slices shown]
[im 1/16]
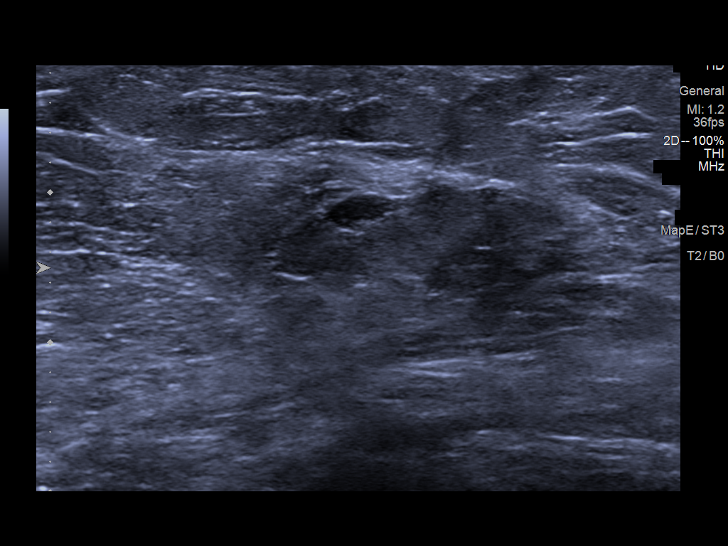
[im 2/16]
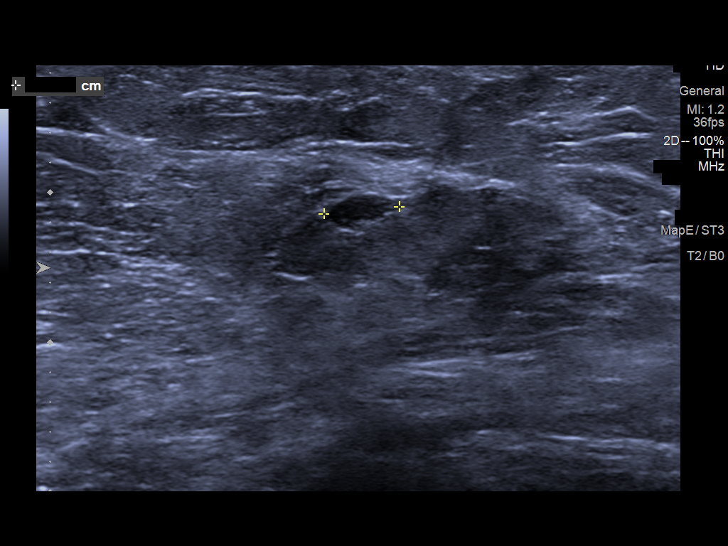
[im 4/16]
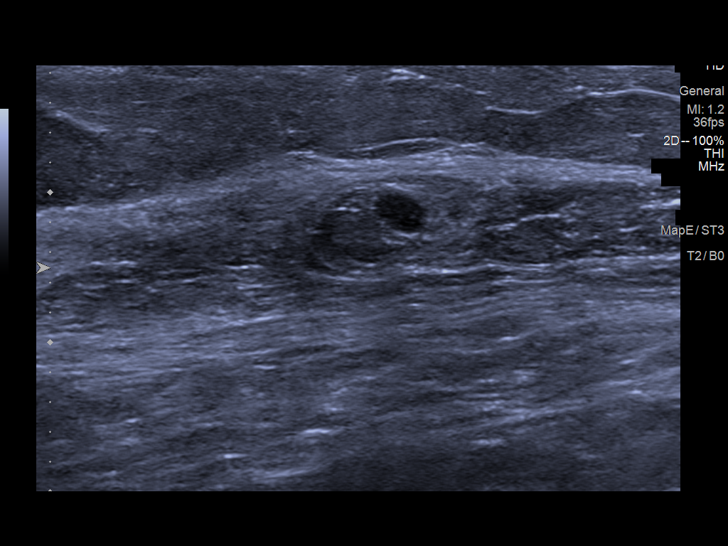
[im 5/16]
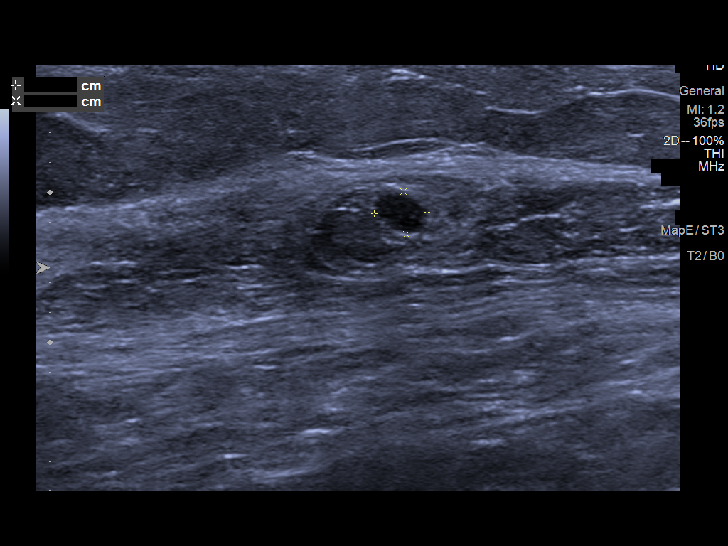
[im 6/16]
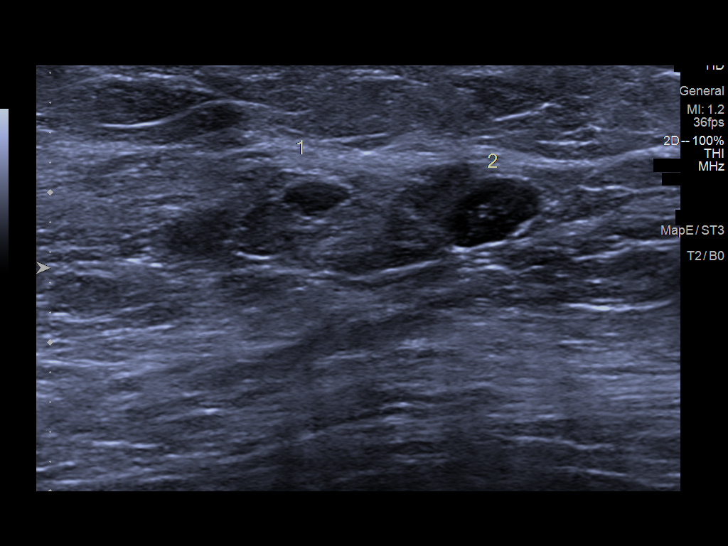
[im 7/16]
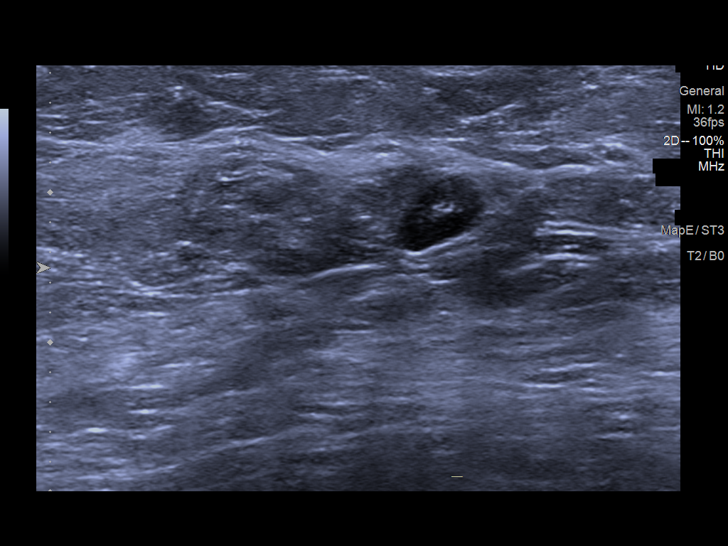
[im 9/16]
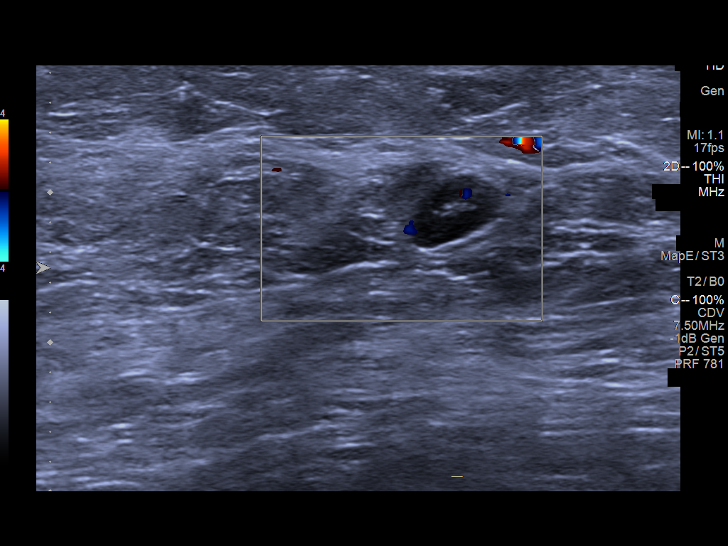
[im 10/16]
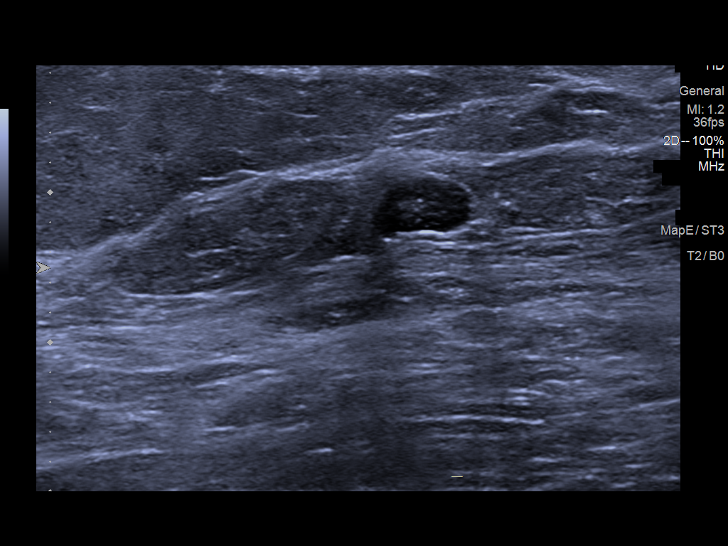
[im 11/16]
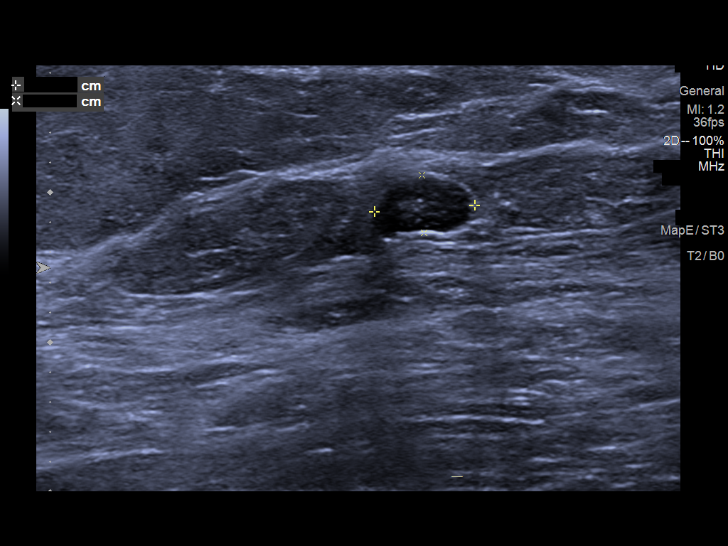
[im 12/16]
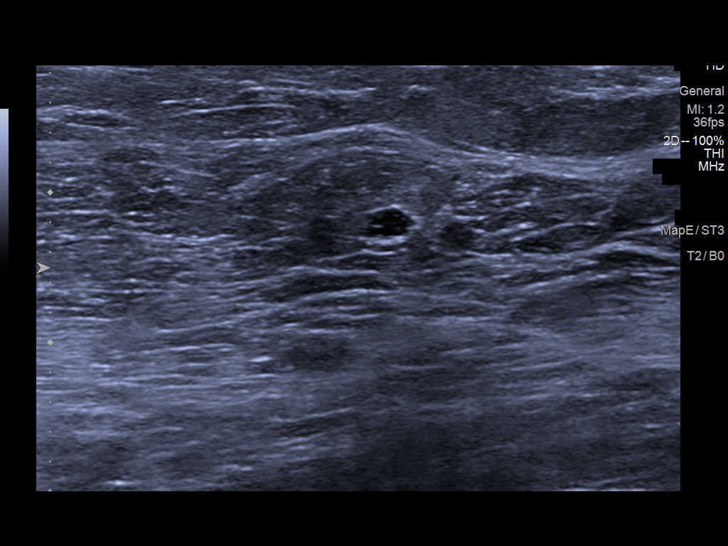
[im 13/16]
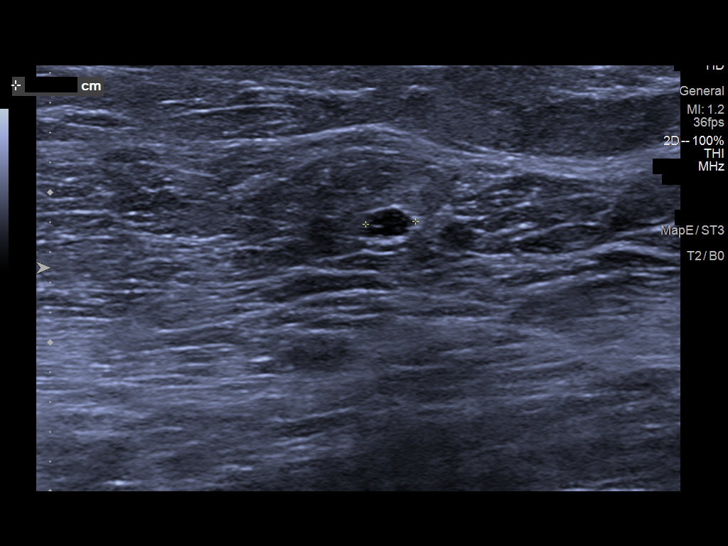
[im 15/16]
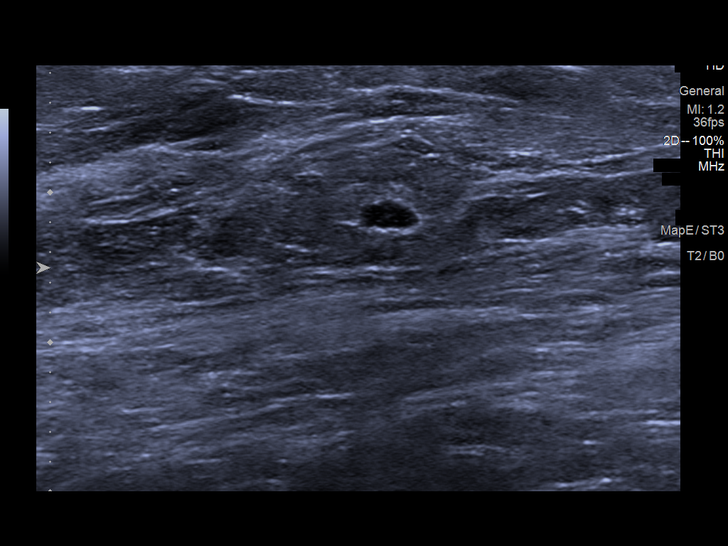
[im 16/16]
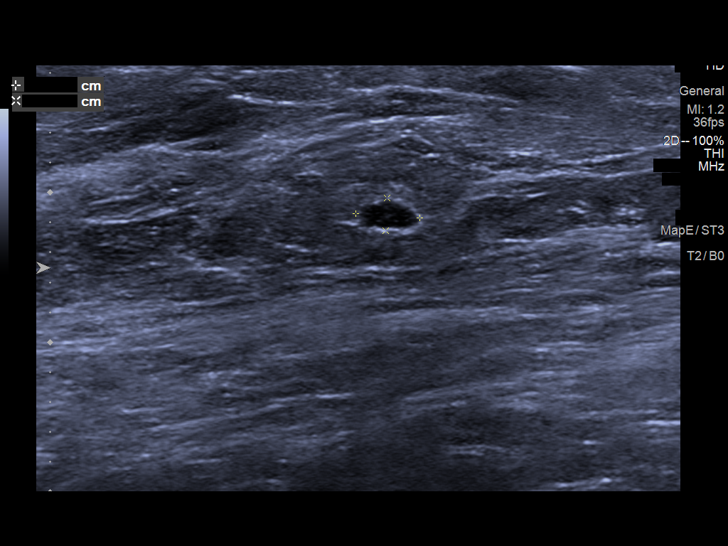

[13 of 16 positions shown; findings below may reference images not displayed]

ACR Breast Density Category b: There are scattered areas of
fibroglandular density.
FINDINGS: The 3 adjacent left breast masses persists on today's imaging. I
suspect intramammary lymph nodes.

Targeted ultrasound is performed, showing a definitive intramammary
lymph node with normal morphology at 1 o'clock, 8 cm from the
nipple. Two smaller masses are identified at 1 o'clock, 8 cm from
the nipple as well. One of these 2 adjacent smaller masses measures
5 x 4 x 3 mm and the other measures 3 by 4 x 2 mm.
IMPRESSION: One of the 3 masses is definitively a normal lymph node. The other 2
are too small to characterize but probably lymph nodes.

RECOMMENDATION:
Recommend six-month follow-up ultrasound of the 2 small masses in
the left breast at 1 o'clock, 8 cm from the nipple, likely
intramammary nodes.

I have discussed the findings and recommendations with the patient.
If applicable, a reminder letter will be sent to the patient
regarding the next appointment.

BI-RADS CATEGORY  3: Probably benign.

## 2023-11-30 IMAGING — MG MM DIGITAL DIAGNOSTIC UNILAT*L* W/ TOMO W/ CAD
4 series · 4 of 12 positions shown · non-contrast
Comparison: Previous exam(s).

CLINICAL DATA: The patient was called back for 3 adjacent left
breast masses.

EXAM:
DIGITAL DIAGNOSTIC UNILATERAL LEFT MAMMOGRAM WITH TOMOSYNTHESIS AND
CAD; ULTRASOUND LEFT BREAST LIMITED
TECHNIQUE: Left digital diagnostic mammography and breast tomosynthesis was
performed. The images were evaluated with computer-aided detection.;
Targeted ultrasound examination of the left breast was performed.

[L MLO synth-2D]
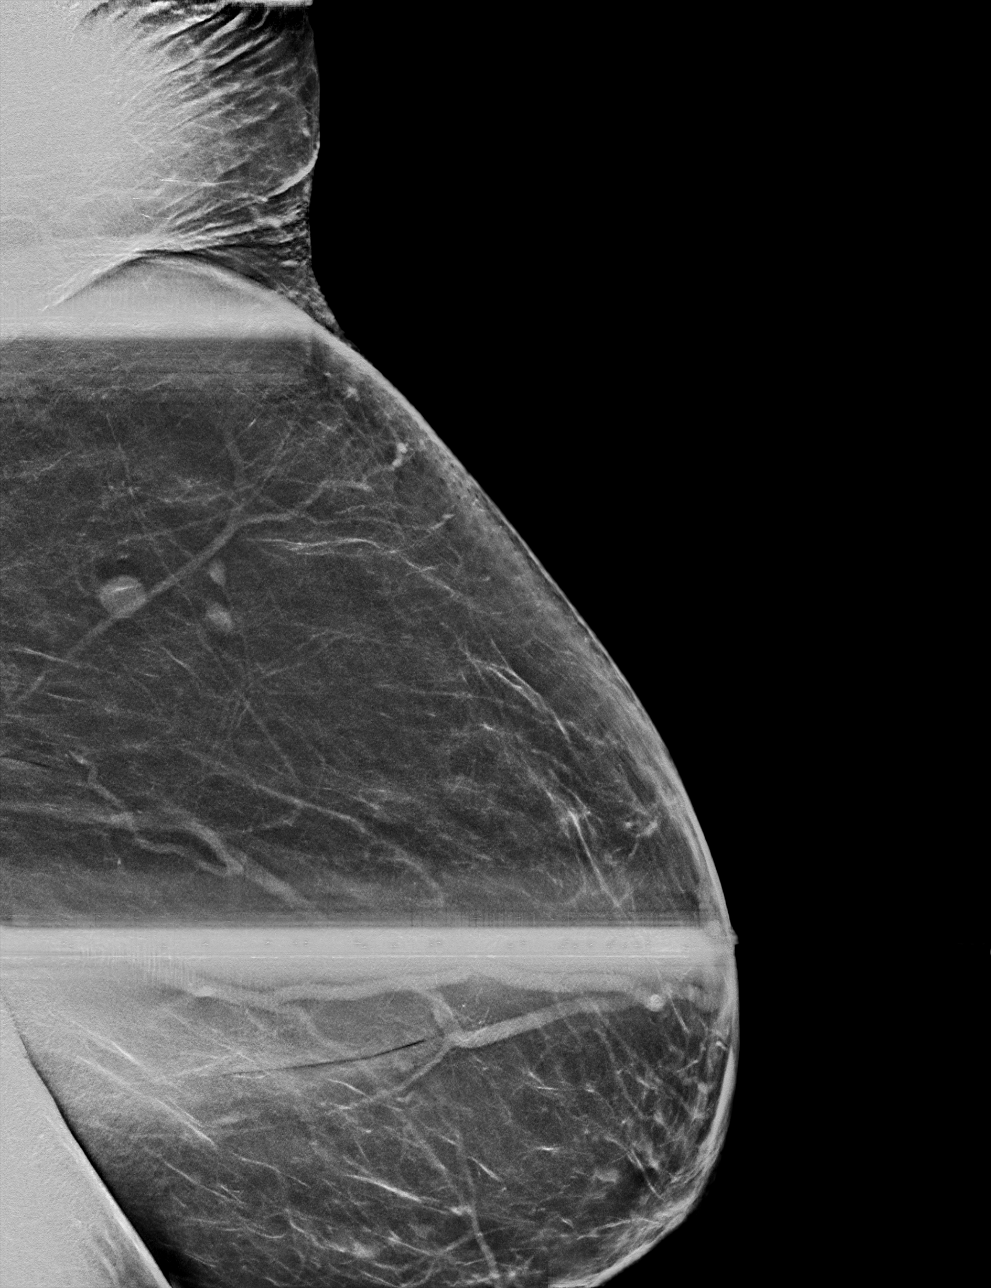

[L CC synth-2D]
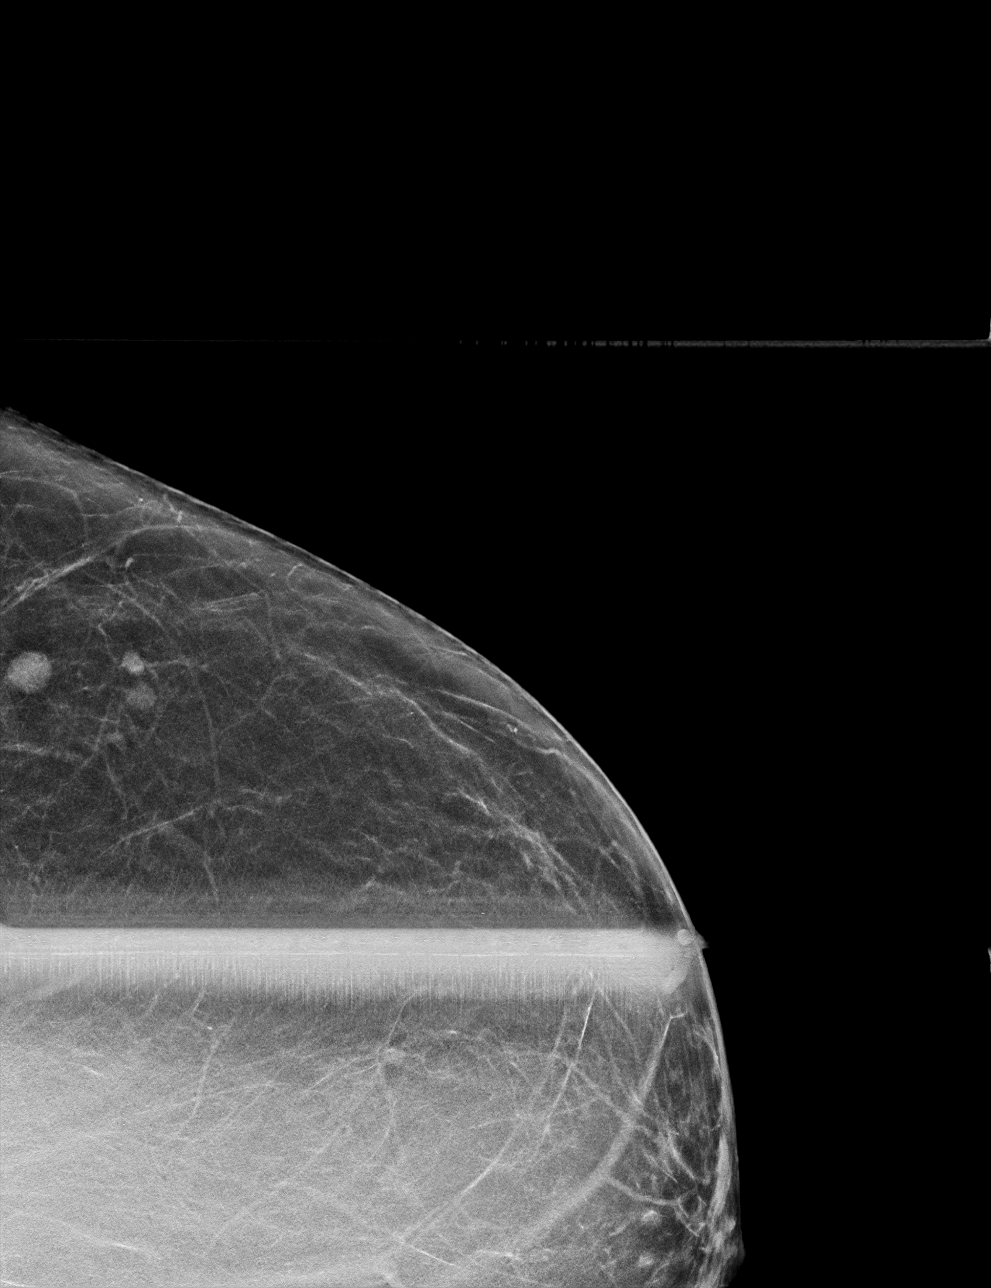

[L MLO tomo · tomo slice 34/67.0]
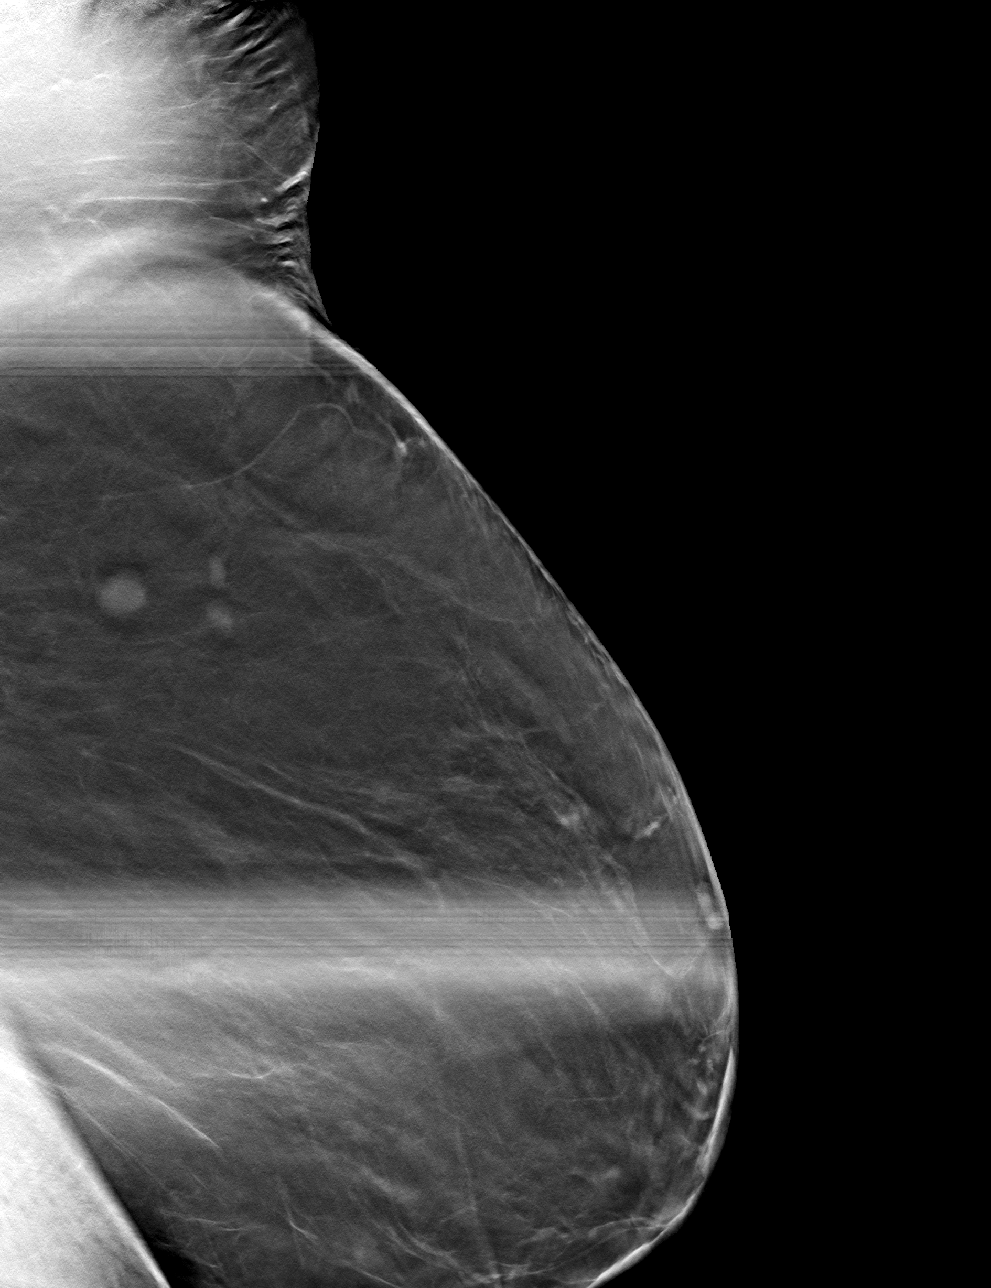

[L CC tomo · tomo slice 23/46.0]
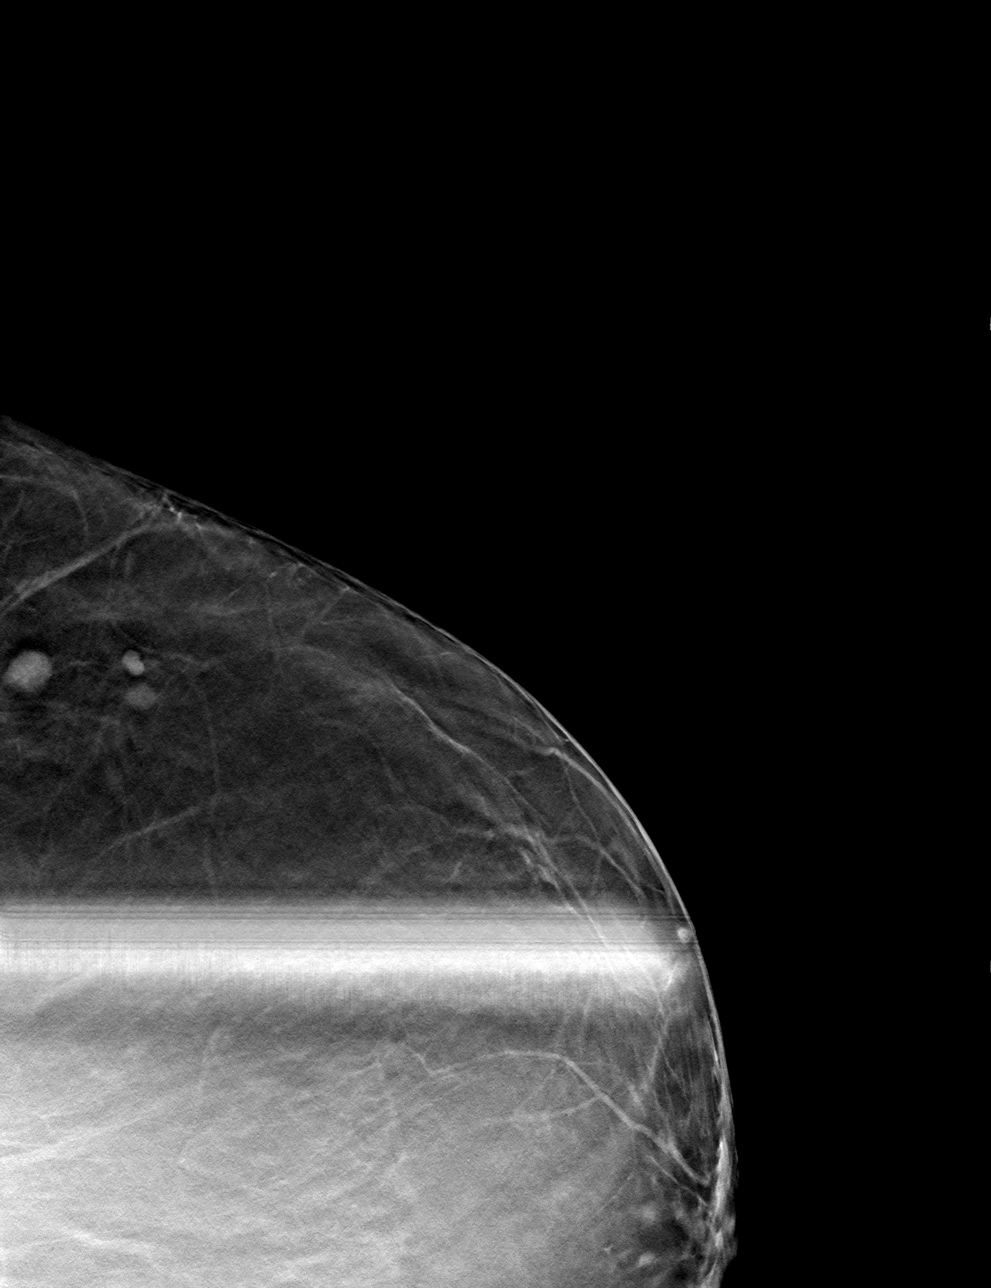

[4 of 12 positions shown; findings below may reference images not displayed]

ACR Breast Density Category b: There are scattered areas of
fibroglandular density.
FINDINGS: The 3 adjacent left breast masses persists on today's imaging. I
suspect intramammary lymph nodes.

Targeted ultrasound is performed, showing a definitive intramammary
lymph node with normal morphology at 1 o'clock, 8 cm from the
nipple. Two smaller masses are identified at 1 o'clock, 8 cm from
the nipple as well. One of these 2 adjacent smaller masses measures
5 x 4 x 3 mm and the other measures 3 by 4 x 2 mm.
IMPRESSION: One of the 3 masses is definitively a normal lymph node. The other 2
are too small to characterize but probably lymph nodes.

RECOMMENDATION:
Recommend six-month follow-up ultrasound of the 2 small masses in
the left breast at 1 o'clock, 8 cm from the nipple, likely
intramammary nodes.

I have discussed the findings and recommendations with the patient.
If applicable, a reminder letter will be sent to the patient
regarding the next appointment.

BI-RADS CATEGORY  3: Probably benign.

## 2023-11-30 NOTE — Progress Notes (Unsigned)
 Cardiology Clinic Note   Date: 12/02/2023 ID: Alla, Sloma 1940/09/13, MRN [id number]  Primary Cardiologist:  Redell Cave, MD  Chief Complaint   Bonnie Hinton is a 83 y.o. female who presents to the clinic today for routine follow up.   Patient Profile   Bonnie Hinton is followed by Dr. Cave for the history outlined below.       Past medical history significant for: PAF. Onset May 2024.  14-day ZIO 08/26/2022: HR 36 to 150 bpm, average 86 bpm.  100% A-fib.  38 pauses, longest 3.9 seconds.  Rare ventricular ectopy. Echo 10/07/2022: EF 55 to 60%.  No RWMA.  Grade I DD.  Normal RV size/function.  Normal PA pressure.  Mild LAE.  Moderate MAC.  Aortic valve sclerosis/calcification without stenosis. Hypertension. Renal artery ultrasound 06/13/2021: Normal size kidneys bilaterally.  No evidence of renal artery stenosis bilaterally. Limited view of kidneys due to body habitus.  Only the distal artery flow is seen with minimal confidence but shows no significant stenosis. Hyperlipidemia. Lipid panel 10/20/2023: Direct LDL 142, HDL 50, TG 304, total 233. OSA. CPAP. T2DM. CVA. Lymphedema. RA.  In summary, patient was seen by Dr. Monette in March 2017 for preoperative risk assessment prior to knee replacement. Patient had no cardiac complaints but reported being mostly sedentary secondary to mobility issues. She underwent echo and nuclear stress test for risk stratification. Echo showed EF 55-60%, no RWMA, normal diastolic parameters. Nuclear stress test was a low risk study with no significant ischemia.   Patient was first evaluated by Dr. Cave on 10/10/2022 for afib at the request of Dr. Marylynn. Patient was diagnosed with afib at routine visit with PCP in May 2024. Patient was started on metoprolol  and Xarelto . Patient denied palpitations. Sleep study demonstrated OSA and patient started CPAP. Zio monitor and echo ordered by PCP and performed prior to  office visit.  14 day Zio showed 100% afib burden with 38 pauses as detailed above. Echo showed normal LV/RV function as detailed above. Patient was in sinus rhythm at the time of her visit. It was felt that BB and CPAP were contributing to rhythm control.    Patient was last seen in the office by me on 05/02/2023 for routine follow-up.  She had no cardiac complaints at the time of her visit.  EKG demonstrated NSR 87 bpm.  She had not been using her CPAP secondary to dealing with an eye infection.  The infection had cleared and she plans on cleaning her CPAP equipment to resume use.     History of Present Illness    Today, patient is here alone. She reports doing well from a cardiac standpoint. She reports a hospital admission in July for lower extremity cellulitis. She is now being followed by wound care for lower extremity wraps. She is waiting to get lymphedema pumps. Patient reports chronic dyspnea worsened with recent bronchitis but is now slowly improving. She has been sleeping in a recliner secondary to chronic back pain. No chest pain, pressure, or tightness. No palpitations.  She is trying to increase her walking at home. She has been working with home PT once a week and doing exercises in between. She is going to start OT at home soon as well. She has been utilizing a seated peddler daily.  She has not taken any medications this morning.     ROS: All other systems reviewed and are otherwise negative except as noted in History of Present Illness.  EKGs/Labs Reviewed    EKG Interpretation Date/Time:  Tuesday December 02 2023 08:56:52 EDT Ventricular Rate:  79 PR Interval:  162 QRS Duration:  82 QT Interval:  360 QTC Calculation: 412 R Axis:   -9  Text Interpretation: Normal sinus rhythm Normal ECG When compared with ECG of 07-Oct-2023 22:42, PREVIOUS ECG IS PRESENT Confirmed by Loistine Sober [phone]) on 12/02/2023 9:05:07 AM   10/20/2023: ALT 11; AST 19; BUN 14; Creatinine, Ser  0.90; Potassium 4.3; Sodium 136   10/08/2023: Hemoglobin 11.7; WBC 8.4   04/24/2023: TSH 3.24    Restarted  Risk Assessment/Calculations     CHA2DS2-VASc Score = 7   This indicates a 11.2% annual risk of stroke. The patient's score is based upon: CHF History: 0 HTN History: 1 Diabetes History: 1 Stroke History: 2 Vascular Disease History: 0 Age Score: 2 Gender Score: 1          Physical Exam    VS:  BP (!) 146/74   Pulse 79   Ht 5' 5 (1.651 m)   Wt (!) 325 lb (147.4 kg)   SpO2 94%   BMI 54.08 kg/m  , BMI Body mass index is 54.08 kg/m.  GEN: Well nourished, well developed, in no acute distress. Neck: No JVD or carotid bruits. Cardiac:  RRR.  No murmur. No rubs or gallops.   Respiratory:  Respirations regular and unlabored. Clear to auscultation without rales, wheezing or rhonchi. GI: Soft, nontender, nondistended. Extremities: Radials 2+ and equal bilaterally. No clubbing or cyanosis. Bilateral lower extremities with leg wraps in place.  Skin: Warm and dry, no rash. Neuro: Strength intact.  Assessment & Plan   PAF Onset May 2024. 14 day Zio June 2024 showed 100% afib with 38 pauses, longest 3.9 seconds.  Denies spontaneous bleeding concerns.  Patient denies palpitations. EKG demonstrates NSR 79 bpm. - Continue metoprolol  and Xarelto .    Hypertension Renal artery US  March 2023 had limited view of kidneys due to body habitus and only distal artery flow was seen with minimal confidence but showed no significant stenosis. BP today 146/74. No report of headaches or dizziness. She did not take her medications this morning.   - Continue amlodipine , metoprolol , spironolactone .   Hyperlipidemia/statin intolerance LDL 142 July 2025, not at goal. Patient reports atorvastatin  and rosuvastatin  have caused stomach upset.  - Continue Zetia . - Followed by PCP.   OSA Patient reports she has used her CPAP in a couple of weeks secondary to having bronchitis. She is  encouraged to use it as soon as she is able.  - Encouraged continued use of CPAP.  Disposition: Return in 6 months or sooner as needed.          Signed, Sober HERO. Rayfield Beem, DNP, NP-C

## 2023-12-01 ENCOUNTER — Encounter: Attending: Physician Assistant | Admitting: Physician Assistant

## 2023-12-01 DIAGNOSIS — E11628 Type 2 diabetes mellitus with other skin complications: Secondary | ICD-10-CM | POA: Insufficient documentation

## 2023-12-01 DIAGNOSIS — I890 Lymphedema, not elsewhere classified: Secondary | ICD-10-CM | POA: Insufficient documentation

## 2023-12-01 DIAGNOSIS — Z7901 Long term (current) use of anticoagulants: Secondary | ICD-10-CM | POA: Diagnosis not present

## 2023-12-01 DIAGNOSIS — Z8673 Personal history of transient ischemic attack (TIA), and cerebral infarction without residual deficits: Secondary | ICD-10-CM | POA: Diagnosis not present

## 2023-12-01 DIAGNOSIS — I480 Paroxysmal atrial fibrillation: Secondary | ICD-10-CM | POA: Diagnosis not present

## 2023-12-01 DIAGNOSIS — I10 Essential (primary) hypertension: Secondary | ICD-10-CM | POA: Insufficient documentation

## 2023-12-02 ENCOUNTER — Encounter: Payer: Self-pay | Admitting: Student

## 2023-12-02 ENCOUNTER — Ambulatory Visit: Attending: Student | Admitting: Student

## 2023-12-02 VITALS — BP 146/74 | HR 79 | Ht 65.0 in | Wt 325.0 lb

## 2023-12-02 DIAGNOSIS — E785 Hyperlipidemia, unspecified: Secondary | ICD-10-CM

## 2023-12-02 DIAGNOSIS — Z789 Other specified health status: Secondary | ICD-10-CM

## 2023-12-02 DIAGNOSIS — I10 Essential (primary) hypertension: Secondary | ICD-10-CM

## 2023-12-02 DIAGNOSIS — I480 Paroxysmal atrial fibrillation: Secondary | ICD-10-CM

## 2023-12-02 DIAGNOSIS — E1169 Type 2 diabetes mellitus with other specified complication: Secondary | ICD-10-CM | POA: Diagnosis not present

## 2023-12-02 DIAGNOSIS — G4733 Obstructive sleep apnea (adult) (pediatric): Secondary | ICD-10-CM

## 2023-12-02 NOTE — Patient Instructions (Addendum)
 Bonnie Hinton is a medication Instructions:  Your physician recommends that you continue on your current medications as directed. Please refer to the Current Medication list given to you today.   *If you need a refill on your cardiac medications before your next appointment, please call your pharmacy*  Lab Work: None ordered at this time  If you have labs (blood work) drawn today and your tests are completely normal, you will receive your results only by: MyChart Message (if you have MyChart) OR A paper copy in the mail If you have any lab test that is abnormal or we need to change your treatment, we will call you to review the results.  Testing/Procedures: None ordered at this time   Follow-Up: At [facility], you and your health needs are our priority.  As part of our continuing mission to provide you with exceptional heart care, our providers are all part of one team.  This team includes your primary Cardiologist (physician) and Advanced Practice Providers or APPs (Physician Assistants and Nurse Practitioners) who all work together to provide you with the care you need, when you need it.  Your next appointment:   6 month(s)  Provider:   Redell Cave, MD or Barnie Hila, NP    We recommend signing up for the patient portal called MyChart.  Sign up information is provided on this After Visit Summary.  MyChart is used to connect with patients for Virtual Visits (Telemedicine).  Patients are able to view lab/test results, encounter notes, upcoming appointments, etc.  Non-urgent messages can be sent to your provider as well.   To learn more about what you can do with MyChart, go to [email or web address].

## 2023-12-06 ENCOUNTER — Other Ambulatory Visit: Payer: Self-pay | Admitting: Internal Medicine

## 2023-12-08 ENCOUNTER — Ambulatory Visit: Admitting: Internal Medicine

## 2023-12-09 ENCOUNTER — Inpatient Hospital Stay: Admission: RE | Admit: 2023-12-09 | Source: Ambulatory Visit

## 2023-12-09 ENCOUNTER — Other Ambulatory Visit

## 2023-12-13 ENCOUNTER — Other Ambulatory Visit: Payer: Self-pay | Admitting: Internal Medicine

## 2023-12-15 ENCOUNTER — Encounter: Admitting: Physician Assistant

## 2023-12-15 ENCOUNTER — Ambulatory Visit (INDEPENDENT_AMBULATORY_CARE_PROVIDER_SITE_OTHER): Admitting: *Deleted

## 2023-12-15 VITALS — Ht 68.5 in | Wt 325.0 lb

## 2023-12-15 DIAGNOSIS — E11628 Type 2 diabetes mellitus with other skin complications: Secondary | ICD-10-CM | POA: Diagnosis not present

## 2023-12-15 DIAGNOSIS — Z0000 Encounter for general adult medical examination without abnormal findings: Secondary | ICD-10-CM | POA: Diagnosis not present

## 2023-12-15 NOTE — Progress Notes (Signed)
 Subjective:   Bonnie Hinton is a 83 y.o. who presents for a Medicare Wellness preventive visit.  As a reminder, Annual Wellness Visits don't include a physical exam, and some assessments may be limited, especially if this visit is performed virtually. We may recommend an in-person follow-up visit with your provider if needed.  Visit Complete: Virtual I connected with  [name] on 12/15/23 by a audio enabled telemedicine application and verified that I am speaking with the correct person using two identifiers.  Patient Location: Home  Provider Location: Home Office  I discussed the limitations of evaluation and management by telemedicine. The patient expressed understanding and agreed to proceed.  Vital Signs: Because this visit was a virtual/telehealth visit, some criteria may be missing or patient reported. Any vitals not documented were not able to be obtained and vitals that have been documented are patient reported.  VideoDeclined- This patient declined Librarian, academic. Therefore the visit was completed with audio only.  Persons Participating in Visit: Patient.  AWV Questionnaire: No: Patient Medicare AWV questionnaire was not completed prior to this visit.  Cardiac Risk Factors include: advanced age (>81men, >63 women);diabetes mellitus;hypertension;dyslipidemia;obesity (BMI >30kg/m2);sedentary lifestyle;Other (see comment), Risk factor comments: AFib     Objective:    Today's Vitals   12/15/23 1506  Weight: (!) 325 lb (147.4 kg)  Height: 5' 8.5 (1.74 m)   Body mass index is 48.7 kg/m.     12/15/2023    3:23 PM 10/08/2023    7:55 PM 10/07/2023    5:56 PM 03/22/2022   11:14 AM 03/14/2021    3:54 PM 12/28/2019   11:32 AM 12/25/2018   10:17 AM  Advanced Directives  Does Patient Have a Medical Advance Directive? Yes  No Yes Yes Yes Yes  Type of Estate agent of [place];Living will   Healthcare Power of  [place];Living will Healthcare Power of [place];Living will Healthcare Power of [place];Living will Healthcare Power of [place];Living will  Does patient want to make changes to medical advance directive?    No - Patient declined No - Patient declined No - Patient declined No - Patient declined  Copy of Healthcare Power of Attorney in Chart? No - copy requested   No - copy requested No - copy requested No - copy requested No - copy requested  Would patient like information on creating a medical advance directive?  No - Patient declined         Current Medications (verified) Outpatient Encounter Medications as of 12/15/2023  Medication Sig   acetaminophen  (TYLENOL ) 325 MG tablet Take 2 tablets (650 mg total) by mouth every 6 (six) hours as needed for mild pain (or Fever >/= 101).   albuterol  (VENTOLIN  HFA) 108 (90 Base) MCG/ACT inhaler Inhale 2 puffs into the lungs every 6 (six) hours as needed for wheezing or shortness of breath.   amLODipine  (NORVASC ) 10 MG tablet Take 1 tablet (10 mg total) by mouth daily.   Cholecalciferol (VITAMIN D3) 2000 units TABS Take 1 tablet by mouth daily.   Continuous Glucose Sensor (FREESTYLE LIBRE 2 SENSOR) MISC USE AS DIRECTED. CHANGE SENSOR EVERY 14 DAYS   cyanocobalamin  (VITAMIN B12) 1000 MCG/ML injection Inject 1 mL (1,000 mcg total) into the muscle every 30 (thirty) days.   cycloSPORINE  (RESTASIS ) 0.05 % ophthalmic emulsion Place 1 drop into both eyes 2 (two) times daily.   dicyclomine  (BENTYL ) 20 MG tablet TAKE ONE TABLET BY MOUTH FOUR TIMES A DAY, BEFORE MEALS AND  AT BEDTIME   doxycycline  (VIBRA -TABS) 100 MG tablet Take 1 tablet (100 mg total) by mouth 2 (two) times daily. With a meal   ezetimibe  (ZETIA ) 10 MG tablet Take 1 tablet (10 mg total) by mouth daily.   folic acid  (FOLVITE ) 1 MG tablet Take 1 mg by mouth daily.   furosemide  (LASIX ) 20 MG tablet TAKE ONE TABLET BY MOUTH EVERY DAY   hydroxychloroquine  (PLAQUENIL ) 200 MG tablet Take 200 mg by mouth  2 (two) times daily.   Insulin  Pen Needle (PEN NEEDLES) 32G X 6 MM MISC Use to give insulin    LANTUS  SOLOSTAR 100 UNIT/ML Solostar Pen INJECT 25 UNITS UNDER THE SKIN DAILY AS DIRECTED *INCREASE AS DIRECTED BY PHYSICIAN*   methotrexate  (RHEUMATREX) 2.5 MG tablet Take 20 mg by mouth once a week.   metoprolol  tartrate (LOPRESSOR ) 50 MG tablet TAKE ONE TABLET BY MOUTH TWICE A DAY   mupirocin  ointment (BACTROBAN ) 2 % APPLY TO AFFECTED AREA TWICE A DAY   nystatin  cream (MYCOSTATIN ) Apply topically 2 (two) times daily. to affected area(s)   pantoprazole  (PROTONIX ) 40 MG tablet Take 1 tablet (40 mg total) by mouth daily.   rivaroxaban  (XARELTO ) 20 MG TABS tablet TAKE ONE TABLET BY MOUTH EVERY DAY WITH SUPPER   spironolactone  (ALDACTONE ) 50 MG tablet TAKE ONE TABLET BY MOUTH EVERY DAY   SYNTHROID  150 MCG tablet TAKE ONE TABLET BY MOUTH EVERY DAY   Syringe/Needle, Disp, (SYRINGE 3CC/25GX1) 25G X 1 3 ML MISC Use to give b12 injections once monthly   tiZANidine  (ZANAFLEX ) 4 MG tablet TAKE 1 TABLET BY MOUTH EVERY 6 HOURS AS NEEDED FOR MUSCLE SPASMS   triamcinolone  cream (KENALOG ) 0.1 % Apply 1 Application topically 2 (two) times daily.   triamcinolone  cream in eucerin cream Apply topically 2 (two) times daily.   trospium  (SANCTURA ) 20 MG tablet TAKE ONE TABLET BY MOUTH TWICE A DAY   [DISCONTINUED] predniSONE  (DELTASONE ) 10 MG tablet 6 tablets on Day 1 , then reduce by 1 tablet daily until gone (Patient not taking: Reported on 12/15/2023)   No facility-administered encounter medications on file as of 12/15/2023.    Allergies (verified) Erythromycin, Fentanyl , Iodinated contrast media, Atorvastatin , Metformin  and related, Victoza  [liraglutide ], Latex, Lisinopril, and Other   History: Past Medical History:  Diagnosis Date   Anxiety    Arthritis    Choledocholithiasis    Chronic back pain    stenosis and arthritis   Community acquired pneumonia 05/29/2021   Complication of anesthesia    anxious when  she wakes up   GERD (gastroesophageal reflux disease)    takes Pantoprazole  daily   Glaucoma    dry angle   History of bronchitis    30+ yrs ago   History of MRSA infection 2004   several yrs ago   Hyperlipidemia    takes Zetia  and Crestor  daily   Hypertension    take Amlodipine  daily   Hypothyroidism    takes Synthroid  daily   Joint pain    Joint swelling    Neuropathy    takes Gabapentin  daily   Pancreatitis, gallstone    Peripheral edema    takes Furosemide  daily as needed   Pneumonia    hx of-30 + yrs ago   Pre-diabetes    takes Victoza  daily   Primary localized osteoarthritis of right knee    Subdural hematoma (HCC) 8 yrs ago   hx of   Urinary frequency    Urinary urgency    Past Surgical History:  Procedure Laterality Date   ABDOMINAL HYSTERECTOMY     carpel tunnell Bilateral 2025   CHOLECYSTECTOMY N/A 09/09/2016   Procedure: LAPAROSCOPIC CHOLECYSTECTOMY;  Surgeon: Belinda Cough, MD;  Location: [facility] OR;  Service: General;  Laterality: N/A;   COLONOSCOPY     RADIOLOGY WITH ANESTHESIA N/A 08/22/2015   Procedure: MRI OF LUMBAR SPINE   (RADIOLOGY WITH ANESTHESIA);  Surgeon: Medication Radiologist, MD;  Location: [facility] OR;  Service: Radiology;  Laterality: N/A;   TOTAL HIP ARTHROPLASTY Right    TOTAL HIP ARTHROPLASTY Left    TOTAL KNEE ARTHROPLASTY Left    TOTAL KNEE ARTHROPLASTY Right 01/01/2016   Procedure: TOTAL KNEE ARTHROPLASTY;  Surgeon: Lamar Millman, MD;  Location[facility];  Service: Orthopedics;  Laterality: Right;   Family History  Problem Relation Age of Onset   Cancer Father    Diabetes Maternal Aunt    Cancer Brother    Breast cancer Neg Hx    Social History   Socioeconomic History   Marital status: Widowed    Spouse name: Not on file   Number of children: Not on file   Years of education: Not on file   Highest education level: Not on file  Occupational History   Not on file  Tobacco Use   Smoking status: Never   Smokeless tobacco: Never   Vaping Use   Vaping status: Never Used  Substance and Sexual Activity   Alcohol use: No    Alcohol/week: 0.0 standard drinks of alcohol   Drug use: No   Sexual activity: Not on file  Other Topics Concern   Not on file  Social History Narrative   Not on file   Social Drivers of Health   Financial Resource Strain: Low Risk  (12/15/2023)   Overall Financial Resource Strain (CARDIA)    Difficulty of Paying Living Expenses: Not hard at all  Food Insecurity: No Food Insecurity (12/15/2023)   Hunger Vital Sign    Worried About Running Out of Food in the Last Year: Never true    Ran Out of Food in the Last Year: Never true  Transportation Needs: No Transportation Needs (12/15/2023)   PRAPARE - Administrator, Civil Service (Medical): No    Lack of Transportation (Non-Medical): No  Physical Activity: Inactive (12/15/2023)   Exercise Vital Sign    Days of Exercise per Week: 0 days    Minutes of Exercise per Session: 0 min  Stress: No Stress Concern Present (12/15/2023)   [employer] of Occupational Health - Occupational Stress Questionnaire    Feeling of Stress: Not at all  Social Connections: Socially Isolated (12/15/2023)   Social Connection and Isolation Panel    Frequency of Communication with Friends and Family: More than three times a week    Frequency of Social Gatherings with Friends and Family: Twice a week    Attends Religious Services: Never    Database administrator or Organizations: No    Attends Banker Meetings: Never    Marital Status: Widowed    Tobacco Counseling Counseling given: Not Answered    Clinical Intake:  Pre-visit preparation completed: Yes  Pain : No/denies pain     BMI - recorded: 48.7 Nutritional Status: BMI > 30  Obese Nutritional Risks: None Diabetes: Yes CBG done?: Yes (per patient FBS 145)  Lab Results  Component Value Date   HGBA1C 9.1 (H) 10/20/2023   HGBA1C 8.6 (H) 04/24/2023   HGBA1C 8.2 (A)  04/24/2023  How often do you need to have someone help you when you read instructions, pamphlets, or other written materials from your doctor or pharmacy?: 1 - Never  Interpreter Needed?: No  Information entered by :: R, Maddex Garlitz LPN   Activities of Daily Living     12/15/2023    3:08 PM 10/08/2023    7:55 PM  In your present state of health, do you have any difficulty performing the following activities:  Hearing? 0   Vision? 0   Difficulty concentrating or making decisions? 0   Walking or climbing stairs? 1   Dressing or bathing? 0   Doing errands, shopping? 0 1  Preparing Food and eating ? N   Using the Toilet? N   In the past six months, have you accidently leaked urine? N   Do you have problems with loss of bowel control? N   Managing your Medications? N   Managing your Finances? N   Housekeeping or managing your Housekeeping? N     Patient Care Team: Marylynn Verneita CROME, MD as PCP - General (Internal Medicine) Darliss Rogue, MD as PCP - Cardiology (Cardiology) Kathlynn Sharper, MD as Consulting Physician (Orthopedic Surgery)  I have updated your Care Teams any recent Medical Services you may have received from other providers in the past year.     Assessment:   This is a routine wellness examination for Nora.  Hearing/Vision screen Hearing Screening - Comments:: No issues Vision Screening - Comments:: glasses   Goals Addressed             This Visit's Progress    Patient Stated       Wants to work on getting out of the wheelchair       Depression Screen     12/15/2023    3:16 PM 11/19/2023    2:48 PM 10/20/2023    2:01 PM 06/30/2023   10:48 AM 05/19/2023   10:53 AM 04/24/2023    9:06 AM 01/01/2023    9:59 AM  PHQ 2/9 Scores  PHQ - 2 Score 0 0 0 0 0 0 0  PHQ- 9 Score 0   0       Fall Risk     12/15/2023    3:11 PM 11/19/2023    2:48 PM 10/20/2023    2:01 PM 06/30/2023   10:48 AM 05/19/2023   10:53 AM  Fall Risk   Falls in the past year? 1 1 0 1  0  Number falls in past yr: 0 1 0 0 0  Injury with Fall? 0 0 0 0 0  Risk for fall due to : History of fall(s);Impaired mobility History of fall(s) No Fall Risks History of fall(s) No Fall Risks  Follow up Falls evaluation completed;Falls prevention discussed Falls evaluation completed Falls evaluation completed Falls evaluation completed Falls evaluation completed    MEDICARE RISK AT HOME:  Medicare Risk at Home Any stairs in or around the home?: No If so, are there any without handrails?: No Home free of loose throw rugs in walkways, pet beds, electrical cords, etc?: Yes Adequate lighting in your home to reduce risk of falls?: Yes Life alert?: No Use of a cane, walker or w/c?: Yes Grab bars in the bathroom?: Yes Shower chair or bench in shower?: Yes Elevated toilet seat or a handicapped toilet?: Yes  TIMED UP AND GO:  Was the test performed?  No  Cognitive Function: 6CIT completed    12/23/2017   11:04 AM  MMSE -  Mini Mental State Exam  Orientation to time 5  Orientation to Place 5  Registration 3  Attention/ Calculation 5  Recall 2  Language- name 2 objects 2  Language- repeat 1  Language- follow 3 step command 3  Language- read & follow direction 1  Write a sentence 1  Copy design 1  Total score 29        12/15/2023    3:24 PM 03/22/2022   11:34 AM 12/28/2019   11:34 AM 12/25/2018   10:32 AM  6CIT Screen  What Year? 0 points 0 points 0 points 0 points  What month? 0 points 0 points 0 points 0 points  What time? 0 points 0 points 0 points 0 points  Count back from 20 0 points 0 points  0 points  Months in reverse 0 points 0 points  0 points  Repeat phrase 2 points 0 points  0 points  Total Score 2 points 0 points  0 points    Immunizations Immunization History  Administered Date(s) Administered   Fluad Quad(high Dose 65+) 01/01/2019, 12/31/2019, 01/17/2021, 01/25/2022   Fluad Trivalent(High Dose 65+) 01/01/2023   INFLUENZA, HIGH DOSE SEASONAL PF  01/23/2015, 12/04/2015, 12/25/2016, 12/23/2017   Moderna Sars-Covid-2 Vaccination 04/07/2019, 05/05/2019, 03/02/2020   Pneumococcal Conjugate-13 04/25/2015   Pneumococcal Polysaccharide-23 02/05/2018   Tdap 01/20/2020   Zoster Recombinant(Shingrix) 05/03/2021, 12/24/2021    Screening Tests Health Maintenance  Topic Date Due   OPHTHALMOLOGY EXAM  05/09/2022   Medicare Annual Wellness (AWV)  03/23/2023   COVID-19 Vaccine (4 - 2025-26 season) 11/24/2023   Influenza Vaccine  06/22/2024 (Originally 10/24/2023)   FOOT EXAM  12/27/2023   HEMOGLOBIN A1C  04/21/2024   Diabetic kidney evaluation - eGFR measurement  10/19/2024   Diabetic kidney evaluation - Urine ACR  10/19/2024   DTaP/Tdap/Td (2 - Td or Tdap) 01/19/2030   Pneumococcal Vaccine: 50+ Years  Completed   DEXA SCAN  Completed   Zoster Vaccines- Shingrix  Completed   HPV VACCINES  Aged Out   Meningococcal B Vaccine  Aged Out   Hepatitis C Screening  Discontinued    Health Maintenance Items Addressed: Discussed the need to update flu vaccine. Patient declines covid vaccines.  Patient has a mammogram scheduled 12/18/23.  Additional Screening:  Vision Screening: Recommended annual ophthalmology exams for early detection of glaucoma and other disorders of the eye. Is the patient up to date with their annual eye exam?  No  Who is the provider or what is the name of the office in which the patient attends annual eye exams? [facility]  Patient stated that she will call and schedule a diabetic eye exam.  Dental Screening: Recommended annual dental exams for proper oral hygiene  Community Resource Referral / Chronic Care Management: CRR required this visit?  No   CCM required this visit?  No   Plan:    I have personally reviewed and noted the following in the patient's chart:   Medical and social history Use of alcohol, tobacco or illicit drugs  Current medications and supplements including opioid prescriptions. Patient is  not currently taking opioid prescriptions. Functional ability and status Nutritional status Physical activity Advanced directives List of other physicians Hospitalizations, surgeries, and ER visits in previous 12 months Vitals Screenings to include cognitive, depression, and falls Referrals and appointments  In addition, I have reviewed and discussed with patient certain preventive protocols, quality metrics, and best practice recommendations. A written personalized care plan for preventive services as well as  general preventive health recommendations were provided to patient.   Angeline Fredericks, LPN   [id number]   After Visit Summary: (Pick Up) Due to this being a telephonic visit, with patients personalized plan was offered to patient and patient has requested to Pick up at office.  Notes: Nothing significant to report at this time.

## 2023-12-15 NOTE — Patient Instructions (Signed)
 Bonnie Hinton,  Thank you for taking the time for your Medicare Wellness Visit. I appreciate your continued commitment to your health goals. Please review the care plan we discussed, and feel free to reach out if I can assist you further.  Medicare recommends these wellness visits once per year to help you and your care team stay ahead of potential health issues. These visits are designed to focus on prevention, allowing your provider to concentrate on managing your acute and chronic conditions during your regular appointments.  Please note that Annual Wellness Visits do not include a physical exam. Some assessments may be limited, especially if the visit was conducted virtually. If needed, we may recommend a separate in-person follow-up with your provider.  Ongoing Care Seeing your primary care provider every 3 to 6 months helps us  monitor your health and provide consistent, personalized care.  Remember to get your annual flu vaccine.  Referrals If a referral was made during today's visit and you haven't received any updates within two weeks, please contact the referred provider directly to check on the status.  Recommended Screenings:  Health Maintenance  Topic Date Due   Eye exam for diabetics  05/09/2022   Breast Cancer Screening  09/27/2023   COVID-19 Vaccine (4 - 2025-26 season) 11/24/2023   Flu Shot  06/22/2024*   Complete foot exam   12/27/2023   Hemoglobin A1C  04/21/2024   Yearly kidney function blood test for diabetes  10/19/2024   Yearly kidney health urinalysis for diabetes  10/19/2024   Medicare Annual Wellness Visit  12/14/2024   DTaP/Tdap/Td vaccine (2 - Td or Tdap) 01/19/2030   Pneumococcal Vaccine for age over 14  Completed   DEXA scan (bone density measurement)  Completed   Zoster (Shingles) Vaccine  Completed   HPV Vaccine  Aged Out   Meningitis B Vaccine  Aged Out   Hepatitis C Screening  Discontinued  *Topic was postponed. The date shown is not the original due  date.       12/15/2023    3:23 PM  Advanced Directives  Does Patient Have a Medical Advance Directive? Yes  Type of Estate agent of [place];Living will  Copy of Healthcare Power of Attorney in Chart? No - copy requested   Advance Care Planning is important because it: Ensures you receive medical care that aligns with your values, goals, and preferences. Provides guidance to your family and loved ones, reducing the emotional burden of decision-making during critical moments.  Vision: Annual vision screenings are recommended for early detection of glaucoma, cataracts, and diabetic retinopathy. These exams can also reveal signs of chronic conditions such as diabetes and high blood pressure.  Dental: Annual dental screenings help detect early signs of oral cancer, gum disease, and other conditions linked to overall health, including heart disease and diabetes.  Please see the attached documents for additional preventive care recommendations.

## 2023-12-16 ENCOUNTER — Other Ambulatory Visit: Payer: Self-pay

## 2023-12-18 ENCOUNTER — Ambulatory Visit
Admission: RE | Admit: 2023-12-18 | Discharge: 2023-12-18 | Disposition: A | Discharge status: Home / Self Care | Source: Ambulatory Visit | Attending: Internal Medicine | Admitting: Internal Medicine

## 2023-12-18 DIAGNOSIS — Z1231 Encounter for screening mammogram for malignant neoplasm of breast: Secondary | ICD-10-CM

## 2023-12-18 DIAGNOSIS — N63 Unspecified lump in unspecified breast: Secondary | ICD-10-CM

## 2023-12-18 DIAGNOSIS — N6325 Unspecified lump in the left breast, overlapping quadrants: Secondary | ICD-10-CM | POA: Insufficient documentation

## 2024-01-06 ENCOUNTER — Other Ambulatory Visit: Payer: Self-pay | Admitting: Internal Medicine

## 2024-01-09 ENCOUNTER — Ambulatory Visit: Admitting: Internal Medicine

## 2024-01-12 ENCOUNTER — Encounter: Attending: Physician Assistant | Admitting: Physician Assistant

## 2024-01-16 ENCOUNTER — Other Ambulatory Visit: Payer: Self-pay | Admitting: Internal Medicine

## 2024-02-09 ENCOUNTER — Encounter: Attending: Physician Assistant | Admitting: Physician Assistant

## 2024-02-10 NOTE — Telephone Encounter (Signed)
 open in error

## 2024-02-21 ENCOUNTER — Other Ambulatory Visit: Payer: Self-pay | Admitting: Internal Medicine

## 2024-02-23 ENCOUNTER — Encounter: Attending: Physician Assistant | Admitting: Physician Assistant

## 2024-02-23 DIAGNOSIS — Z7901 Long term (current) use of anticoagulants: Secondary | ICD-10-CM | POA: Insufficient documentation

## 2024-02-23 DIAGNOSIS — E11628 Type 2 diabetes mellitus with other skin complications: Secondary | ICD-10-CM | POA: Insufficient documentation

## 2024-02-23 DIAGNOSIS — I890 Lymphedema, not elsewhere classified: Secondary | ICD-10-CM | POA: Insufficient documentation

## 2024-02-23 DIAGNOSIS — Z8673 Personal history of transient ischemic attack (TIA), and cerebral infarction without residual deficits: Secondary | ICD-10-CM | POA: Insufficient documentation

## 2024-02-23 DIAGNOSIS — I10 Essential (primary) hypertension: Secondary | ICD-10-CM | POA: Insufficient documentation

## 2024-02-23 DIAGNOSIS — I480 Paroxysmal atrial fibrillation: Secondary | ICD-10-CM | POA: Insufficient documentation

## 2024-03-03 ENCOUNTER — Encounter: Payer: Self-pay | Admitting: Internal Medicine

## 2024-03-03 ENCOUNTER — Ambulatory Visit: Admitting: Internal Medicine

## 2024-03-03 DIAGNOSIS — E1169 Type 2 diabetes mellitus with other specified complication: Secondary | ICD-10-CM

## 2024-03-03 DIAGNOSIS — I4811 Longstanding persistent atrial fibrillation: Secondary | ICD-10-CM

## 2024-03-03 DIAGNOSIS — E039 Hypothyroidism, unspecified: Secondary | ICD-10-CM

## 2024-03-03 DIAGNOSIS — L03115 Cellulitis of right lower limb: Secondary | ICD-10-CM

## 2024-03-03 DIAGNOSIS — Z794 Long term (current) use of insulin: Secondary | ICD-10-CM

## 2024-03-03 DIAGNOSIS — I10 Essential (primary) hypertension: Secondary | ICD-10-CM

## 2024-03-03 DIAGNOSIS — I872 Venous insufficiency (chronic) (peripheral): Secondary | ICD-10-CM

## 2024-03-03 DIAGNOSIS — L03116 Cellulitis of left lower limb: Secondary | ICD-10-CM

## 2024-03-03 DIAGNOSIS — E1121 Type 2 diabetes mellitus with diabetic nephropathy: Secondary | ICD-10-CM

## 2024-03-03 DIAGNOSIS — Z7409 Other reduced mobility: Secondary | ICD-10-CM

## 2024-03-03 DIAGNOSIS — E1165 Type 2 diabetes mellitus with hyperglycemia: Secondary | ICD-10-CM

## 2024-03-03 DIAGNOSIS — E785 Hyperlipidemia, unspecified: Secondary | ICD-10-CM

## 2024-03-03 LAB — COMPREHENSIVE METABOLIC PANEL WITH GFR
ALT: 9 U/L (ref 0–35)
AST: 14 U/L (ref 0–37)
Albumin: 4 g/dL (ref 3.5–5.2)
Alkaline Phosphatase: 83 U/L (ref 39–117)
BUN: 15 mg/dL (ref 6–23)
CO2: 31 meq/L (ref 19–32)
Calcium: 9.1 mg/dL (ref 8.4–10.5)
Chloride: 102 meq/L (ref 96–112)
Creatinine, Ser: 0.74 mg/dL (ref 0.40–1.20)
GFR: 74.96 mL/min (ref >60.00)
Glucose, Bld: 116 mg/dL — ABNORMAL HIGH (ref 70–99)
Potassium: 4.5 meq/L (ref 3.5–5.1)
Sodium: 140 meq/L (ref 135–145)
Total Bilirubin: 0.4 mg/dL (ref 0.2–1.2)
Total Protein: 6.9 g/dL (ref 6.0–8.3)

## 2024-03-03 LAB — LIPID PANEL
Cholesterol: 231 mg/dL — ABNORMAL HIGH (ref 0–200)
HDL: 46.1 mg/dL (ref >39.00)
LDL Cholesterol: 150 mg/dL — ABNORMAL HIGH (ref 0–99)
NonHDL: 185.18
Total CHOL/HDL Ratio: 5
Triglycerides: 178 mg/dL — ABNORMAL HIGH (ref 0.0–149.0)
VLDL: 35.6 mg/dL (ref 0.0–40.0)

## 2024-03-03 LAB — POCT GLYCOSYLATED HEMOGLOBIN (HGB A1C): Hemoglobin A1C: 7.5 % — AB (ref 4.0–5.6)

## 2024-03-03 LAB — TSH: TSH: 4.69 u[IU]/mL (ref 0.35–5.50)

## 2024-03-03 LAB — LDL CHOLESTEROL, DIRECT: Direct LDL: 168 mg/dL

## 2024-03-03 MED ORDER — PANTOPRAZOLE SODIUM 40 MG PO TBEC: 40.0000 mg | DELAYED_RELEASE_TABLET | Freq: Every day | ORAL | 1 refills | Status: DC

## 2024-03-03 MED ORDER — EZETIMIBE 10 MG PO TABS: 10.0000 mg | ORAL_TABLET | Freq: Every day | ORAL | 1 refills | Status: DC

## 2024-03-03 MED ORDER — SYRINGE 25G X 1" 3 ML MISC: 0 refills | Status: AC

## 2024-03-03 MED ORDER — AMLODIPINE BESYLATE 10 MG PO TABS: 10.0000 mg | ORAL_TABLET | Freq: Every day | ORAL | 1 refills | Status: DC

## 2024-03-03 NOTE — Assessment & Plan Note (Addendum)
 Multifactorial , including DJD with bilateral hip replacements, one knee replacement, RA   and morbid obesity .  She requires assistance transferring from care to wheelchair  and recently feel at home while getting out of her care, requiring EM S assistance to get up.  She is receiving home PT and intends to remain in her home despite her mobility issues

## 2024-03-03 NOTE — Progress Notes (Signed)
 Subjective:  Patient ID: Bonnie Hinton, female    DOB: [birth year]  Age: 83 y.o. MRN: [id number]  CC: The primary encounter diagnosis was Uncontrolled type 2 diabetes mellitus with hyperglycemia (HCC). Diagnoses of Hyperlipidemia associated with type 2 diabetes mellitus (HCC), Acquired hypothyroidism, Stasis dermatitis of both legs, Impaired mobility, Morbid obesity (HCC), Diabetic nephropathy associated with type 2 diabetes mellitus (HCC), Longstanding persistent atrial fibrillation (HCC), Essential hypertension, benign, and Bilateral lower leg cellulitis were also pertinent to this visit.   HPI Bonnie Hinton presents for  Chief Complaint  Patient presents with   Medical Management of Chronic Issues   1) uncontrolled type 2 DM:/obesity/HTN  patient was  last seen August 27 for follow up on uncontrolled diabetes,  but presented with an  acute COPD exacerbarion.  She was  treated for the COPD exacerbation and advised to return in 2 weeks for insulin  adjustment as she had  once again presented for diabetes follow up without her CBG reader.  However her lantus  dose was increased from 35 units to 40 units . Her  September and October visits were cancelled by patient and she presents today without her BS reader  or data review because it was total dead  this morning.   She recalls that her fasting sugars have been  < 150 since she modified  her diet after Thanksgiving indulgences    She has not reviewed her post prandial  levels, mostly because I thought I was doing better she  is taking Lantus  but administering it in her abdomen and notices that a drop of insulin  comes out of the needle when she retracts if from her skin .  She states that the monitor was totally dead this morning.  She does not use mychart.  2) lymphedema:  wounds have healed ,  lymphedema pumps have been ordered but insurance not authorizing. Patient is unable to don regular compression stockings . She does see an   improvement in her leg girth  but the improvement Is minimal.  3) impaired mobility:    She continues to have home PT once a week to improve leg strength  .  She is doing exercises on her own and walking on her own with a walker  for 90 minutes daily.  She continues to request wheelchair  but states that she does not need help transferring from wheelchair to driver's seat.  She is unable to travel alone because she needs a wheelchair  .  She had a fall getting out of the car at home and was unable to get up without assistance.   4) htn:    Outpatient Medications Prior to Visit  Medication Sig Dispense Refill   acetaminophen  (TYLENOL ) 325 MG tablet Take 2 tablets (650 mg total) by mouth every 6 (six) hours as needed for mild pain (or Fever >/= 101).     albuterol  (VENTOLIN  HFA) 108 (90 Base) MCG/ACT inhaler Inhale 2 puffs into the lungs every 6 (six) hours as needed for wheezing or shortness of breath. 3.7 g 11   Cholecalciferol (VITAMIN D3) 2000 units TABS Take 1 tablet by mouth daily.     Continuous Glucose Sensor (FREESTYLE LIBRE 2 SENSOR) MISC USE AS DIRECTED. CHANGE SENSOR EVERY 14 DAYS 6 each 3   cyanocobalamin  (VITAMIN B12) 1000 MCG/ML injection INJECT 1 ML (1,000 MCG TOTAL) INTO THE MUSCLE EVERY 30 (THIRTY) DAYS. ( SINGLE USE VIAL ) 3 mL 4   cycloSPORINE  (RESTASIS ) 0.05 % ophthalmic emulsion Place  1 drop into both eyes 2 (two) times daily.     dicyclomine  (BENTYL ) 20 MG tablet TAKE ONE TABLET BY MOUTH FOUR TIMES A DAY, BEFORE MEALS AND AT BEDTIME 360 tablet 2   folic acid  (FOLVITE ) 1 MG tablet Take 1 mg by mouth daily.     furosemide  (LASIX ) 20 MG tablet TAKE ONE TABLET BY MOUTH EVERY DAY 90 tablet 3   gabapentin  (NEURONTIN ) 100 MG capsule Take 100 mg by mouth 3 (three) times daily.     hydroxychloroquine  (PLAQUENIL ) 200 MG tablet Take 200 mg by mouth 2 (two) times daily.     Insulin  Pen Needle (PEN NEEDLES) 32G X 6 MM MISC Use to give insulin  100 each 1   LANTUS  SOLOSTAR 100 UNIT/ML  Solostar Pen INJECT 25 UNITS UNDER THE SKIN DAILY AS DIRECTED *INCREASE AS DIRECTED BY PHYSICIAN* 30 mL 10   methotrexate  (RHEUMATREX) 2.5 MG tablet Take 20 mg by mouth once a week.     metoprolol  tartrate (LOPRESSOR ) 50 MG tablet TAKE ONE TABLET BY MOUTH TWICE A DAY 180 tablet 1   mupirocin  ointment (BACTROBAN ) 2 % APPLY TO AFFECTED AREA TWICE A DAY 22 g 11   nystatin  cream (MYCOSTATIN ) Apply topically 2 (two) times daily. to affected area(s) 90 g 2   rivaroxaban  (XARELTO ) 20 MG TABS tablet TAKE ONE TABLET BY MOUTH EVERY DAY WITH SUPPER 90 tablet 1   spironolactone  (ALDACTONE ) 50 MG tablet TAKE ONE TABLET BY MOUTH EVERY DAY 90 tablet 3   SYNTHROID  150 MCG tablet TAKE ONE TABLET BY MOUTH EVERY DAY 90 tablet 3   tiZANidine  (ZANAFLEX ) 4 MG tablet TAKE 1 TABLET BY MOUTH EVERY 6 HOURS AS NEEDED FOR MUSCLE SPASMS 270 tablet 3   triamcinolone  cream (KENALOG ) 0.1 % Apply 1 Application topically 2 (two) times daily.     triamcinolone  cream in eucerin cream Apply topically 2 (two) times daily. 480 g 1   trospium  (SANCTURA ) 20 MG tablet TAKE ONE TABLET BY MOUTH TWICE A DAY 180 tablet 2   amLODipine  (NORVASC ) 10 MG tablet Take 1 tablet (10 mg total) by mouth daily. 90 tablet 1   ezetimibe  (ZETIA ) 10 MG tablet Take 1 tablet (10 mg total) by mouth daily. 90 tablet 1   pantoprazole  (PROTONIX ) 40 MG tablet Take 1 tablet (40 mg total) by mouth daily. 90 tablet 1   Syringe/Needle, Disp, (SYRINGE 3CC/25GX1) 25G X 1 3 ML MISC Use to give b12 injections once monthly 50 each 0   doxycycline  (VIBRA -TABS) 100 MG tablet Take 1 tablet (100 mg total) by mouth 2 (two) times daily. With a meal 14 tablet 0   No facility-administered medications prior to visit.    Review of Systems;  Patient denies headache, fevers, malaise, unintentional weight loss, skin rash, eye pain, sinus congestion and sinus pain, sore throat, dysphagia,  hemoptysis , cough, dyspnea, wheezing, chest pain, palpitations, orthopnea, edema, abdominal  pain, nausea, melena, diarrhea, constipation, flank pain, dysuria, hematuria, urinary  Frequency, nocturia, numbness, tingling, seizures,  Focal weakness, Loss of consciousness,  Tremor, insomnia, depression, anxiety, and suicidal ideation.      Objective:  BP 134/70   Pulse 73   Ht 5' 8.5 (1.74 m)   Wt (!) 325 lb (147.4 kg)   SpO2 98%   BMI 48.70 kg/m   BP Readings from Last 3 Encounters:  03/03/24 134/70  12/02/23 (!) 146/74  11/19/23 138/68    Wt Readings from Last 3 Encounters:  03/03/24 (!) 325 lb (147.4 kg)  12/15/23 (!) 325 lb (147.4 kg)  12/02/23 (!) 325 lb (147.4 kg)    Physical Exam Vitals reviewed.  Constitutional:      General: She is not in acute distress.    Appearance: Normal appearance. She is morbidly obese. She is not ill-appearing, toxic-appearing or diaphoretic.     Comments: Wheelchair   HENT:     Head: Normocephalic.  Eyes:     General: No scleral icterus.       Right eye: No discharge.        Left eye: No discharge.     Conjunctiva/sclera: Conjunctivae normal.  Cardiovascular:     Rate and Rhythm: Normal rate and regular rhythm.     Heart sounds: Normal heart sounds.  Pulmonary:     Effort: Pulmonary effort is normal. No respiratory distress.     Breath sounds: Normal breath sounds.  Musculoskeletal:        General: Normal range of motion.  Skin:    General: Skin is warm and dry.  Neurological:     General: No focal deficit present.     Mental Status: She is alert and oriented to person, place, and time. Mental status is at baseline.  Psychiatric:        Mood and Affect: Mood normal.        Behavior: Behavior normal.        Thought Content: Thought content normal.        Judgment: Judgment normal.     Lab Results  Component Value Date   HGBA1C 7.5 (A) 03/03/2024   HGBA1C 9.1 (H) 10/20/2023   HGBA1C 8.6 (H) 04/24/2023    Lab Results  Component Value Date   CREATININE 0.74 03/03/2024   CREATININE 0.90 10/20/2023   CREATININE  0.86 10/10/2023    Lab Results  Component Value Date   WBC 8.4 10/08/2023   HGB 11.7 (L) 10/08/2023   HCT 36.4 10/08/2023   PLT 349 10/08/2023   GLUCOSE 116 (H) 03/03/2024   CHOL 231 (H) 03/03/2024   TRIG 178.0 (H) 03/03/2024   HDL 46.10 03/03/2024   LDLDIRECT 168.0 03/03/2024   LDLCALC 150 (H) 03/03/2024   ALT 9 03/03/2024   AST 14 03/03/2024   NA 140 03/03/2024   K 4.5 03/03/2024   CL 102 03/03/2024   CREATININE 0.74 03/03/2024   BUN 15 03/03/2024   CO2 31 03/03/2024   TSH 4.69 03/03/2024   INR 1.6 (H) 10/07/2023   HGBA1C 7.5 (A) 03/03/2024   MICROALBUR 0.7 10/20/2023    MM 3D DIAGNOSTIC MAMMOGRAM BILATERAL BREAST Result Date: 12/18/2023 CLINICAL DATA:  BI-RADS 3 follow-up of LEFT breast masses, initiated May 2023. EXAM: DIGITAL DIAGNOSTIC BILATERAL MAMMOGRAM WITH TOMOSYNTHESIS AND CAD TECHNIQUE: Bilateral digital diagnostic mammography and breast tomosynthesis was performed. The images were evaluated with computer-aided detection. COMPARISON:  Previous exam(s). ACR Breast Density Category a: The breasts are almost entirely fatty. FINDINGS: Diagnostic images of the LEFT breast demonstrate mammographic stability of 3 adjacent oval circumscribed masses in the LEFT outer breast at posterior depth. No suspicious mass, distortion, or microcalcifications are identified to suggest presence of malignancy. IMPRESSION: Stable LEFT breast masses for greater than 2 years, consistent with a benign etiology. No mammographic evidence of malignancy bilaterally. RECOMMENDATION: Screening mammogram in one year.(Code:SM-B-01Y) I have discussed the findings and recommendations with the patient. If applicable, a reminder letter will be sent to the patient regarding the next appointment. BI-RADS CATEGORY  2: Benign. Electronically Signed   By: Corean Salter  M.D.   On: 12/18/2023 14:11    Assessment & Plan:  .Uncontrolled type 2 diabetes mellitus with hyperglycemia (HCC) -     POCT glycosylated  hemoglobin (Hb A1C) -     Comprehensive metabolic panel with GFR  Hyperlipidemia associated with type 2 diabetes mellitus (HCC) -     Lipid panel -     LDL cholesterol, direct  Acquired hypothyroidism -     TSH  Stasis dermatitis of both legs Assessment & Plan: SECONDARY TO VENOUS INSUFFICIENCY AND NOW LYMPHEDEMA.  MANAGED BY WOUND CNETER.  Continue use of triamcinolone  0.1%/Eucerin compounded cream FOR FLARES.       Impaired mobility Assessment & Plan: Multifactorial , including DJD with bilateral hip replacements, one knee replacement, RA   and morbid obesity .  She requires assistance transferring from care to wheelchair  and recently feel at home while getting out of her care, requiring EM S assistance to get up.  She is receiving home PT and intends to remain in her home despite her mobility issues   Morbid obesity (HCC) Assessment & Plan: Complicated by bilateral OA  knees,  OSA and type 2 DM  .  GLP 1 agonists are contraindicated due to history of gallstone pancreatitis/   Diabetic nephropathy associated with type 2 diabetes mellitus (HCC) Assessment & Plan: She has  improved her  control of diabetes with lincreased dose of lantus  at 40 untis  .  Given her deconditoning. Obesity, and  limited mobility,  adding Farxiga or Jardiance would increase her risk for adverse events.  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  She has had persistent diarrhea with use of metformin  XR .  No changes today .   Lab Results  Component Value Date   HGBA1C 7.5 (A) 03/03/2024   Lab Results  Component Value Date   MICROALBUR 0.7 10/20/2023        Longstanding persistent atrial fibrillation (HCC) Assessment & Plan: Continue metoprolol  and xarelto .  Follow up with cardiology semi annuallly   Essential hypertension, benign Assessment & Plan: Now at  Goal on maximal dose of amlodipine  50 mg spironolactone  metoprolol  and  20 mg furosemide . ACE/ARB are C/i due to history of hyperkalemia/ with prior trial of lisinopril.   Bilateral lower leg cellulitis Assessment & Plan: Resolved,  secondary to venous stasis and lymphedema.  Waiting for lymphedema pumps    Other orders -     amLODIPine  Besylate; Take 1 tablet (10 mg total) by mouth daily.  Dispense: 90 tablet; Refill: 1 -     Ezetimibe ; Take 1 tablet (10 mg total) by mouth daily.  Dispense: 90 tablet; Refill: 1 -     Pantoprazole  Sodium; Take 1 tablet (40 mg total) by mouth daily.  Dispense: 90 tablet; Refill: 1 -     Syringe; Use to give b12 injections once monthly  Dispense: 50 each; Refill: 0     Follow-up: Return in about 3 months (around 06/02/2024) for follow up diabetes.   Bonnie LITTIE Kettering, MD

## 2024-03-03 NOTE — Assessment & Plan Note (Signed)
 Now at  Goal on maximal dose of amlodipine 50 mg spironolactone metoprolol and 20 mg furosemide. ACE/ARB are C/i due to history of hyperkalemia/ with prior trial of lisinopril.

## 2024-03-03 NOTE — Assessment & Plan Note (Signed)
 SECONDARY TO VENOUS INSUFFICIENCY AND NOW LYMPHEDEMA.  MANAGED BY WOUND CNETER.  Continue use of triamcinolone  0.1%/Eucerin compounded cream FOR FLARES.

## 2024-03-03 NOTE — Assessment & Plan Note (Addendum)
 Continue metoprolol  and xarelto .  Follow up with cardiology semi annuallly

## 2024-03-03 NOTE — Assessment & Plan Note (Signed)
 Resolved,  secondary to venous stasis and lymphedema.  Waiting for lymphedema pumps

## 2024-03-03 NOTE — Patient Instructions (Addendum)
 YOUR A1C HAS IMPROVED FROM 9.1 TO 7.5  CONTINUE TAKING 40 UNITS OF LANTUS   OUR GOAL IS A1C < 7.0 ,  BUT I CANNOT ADJUST YOUR MEDICATIONS TODAY BECAUSE YOU HAVE NOT BROUGHT YOUR BLOOD SUGARS IN FOR REVIEW (AGAIN)\   PLEASE REVIEW YOUR BLOOD SUGARS FROM THE LAST WEEK AND ASK YOUR DAUGHTER TO SEND THEM TO ME THOUGH YOUR MYCHART OR BY PHONE.  I WANT POST PRANDIALS (2 HOURS AFTER EATING)

## 2024-03-03 NOTE — Assessment & Plan Note (Addendum)
 Complicated by bilateral OA  knees,  OSA and type 2 DM  .  GLP 1 agonists are contraindicated due to history of gallstone pancreatitis/

## 2024-03-03 NOTE — Assessment & Plan Note (Addendum)
 She has  improved her  control of diabetes with lincreased dose of lantus  at 40 untis  .  Given her deconditoning. Obesity, and  limited mobility,  adding Farxiga or Jardiance would increase her risk for adverse events.  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  She has had persistent diarrhea with use of metformin  XR .  No changes today .   Lab Results  Component Value Date   HGBA1C 7.5 (A) 03/03/2024   Lab Results  Component Value Date   MICROALBUR 0.7 10/20/2023

## 2024-03-04 ENCOUNTER — Ambulatory Visit: Payer: Self-pay | Admitting: Internal Medicine

## 2024-03-04 ENCOUNTER — Encounter: Payer: Self-pay | Admitting: Internal Medicine

## 2024-03-15 ENCOUNTER — Encounter: Admitting: Physician Assistant

## 2024-03-15 DIAGNOSIS — I480 Paroxysmal atrial fibrillation: Secondary | ICD-10-CM | POA: Diagnosis not present

## 2024-03-15 DIAGNOSIS — I890 Lymphedema, not elsewhere classified: Secondary | ICD-10-CM | POA: Diagnosis not present

## 2024-03-15 DIAGNOSIS — I10 Essential (primary) hypertension: Secondary | ICD-10-CM | POA: Diagnosis not present

## 2024-03-15 DIAGNOSIS — E11628 Type 2 diabetes mellitus with other skin complications: Secondary | ICD-10-CM | POA: Diagnosis present

## 2024-03-15 DIAGNOSIS — Z8673 Personal history of transient ischemic attack (TIA), and cerebral infarction without residual deficits: Secondary | ICD-10-CM | POA: Diagnosis not present

## 2024-03-15 DIAGNOSIS — Z7901 Long term (current) use of anticoagulants: Secondary | ICD-10-CM | POA: Diagnosis not present

## 2024-03-16 ENCOUNTER — Telehealth: Payer: Self-pay

## 2024-03-16 NOTE — Telephone Encounter (Signed)
 Copied from CRM #[id number]. Topic: General - Other >> Mar 16, 2024 11:30 AM Revonda D wrote: Reason for CRM: Pt is returning a missed call from [facility] and was advised to call back. I informed the pt that Harlene is currently not available and will callback once available.

## 2024-03-16 NOTE — Telephone Encounter (Signed)
 Called Patient and she states she does Not take this medication.   Patient would like to know if she can come and a nurse coe to the parking lot and give her a flu vaccine?

## 2024-03-16 NOTE — Telephone Encounter (Signed)
 Received a refill request from [facility] mail order pharmacy for Cetirizine  10 mg. Not in current medication list.   LMTCB. Need to find out if pt is taking this medication and if she requested the refill.

## 2024-03-22 NOTE — Telephone Encounter (Signed)
 Patient not currently taking cetirizine  10mg  per telephone note from Almarie Hummer.

## 2024-03-29 ENCOUNTER — Other Ambulatory Visit: Payer: Self-pay | Admitting: Internal Medicine

## 2024-03-29 NOTE — Telephone Encounter (Signed)
 Copied from CRM [id number]. Topic: Clinical - Medication Refill >> Mar 29, 2024 11:33 AM Tiffini S wrote: Medication:  rivaroxaban  (XARELTO ) 20 MG TABS tablet   Has the patient contacted their pharmacy? Yes (Agent: If no, request that the patient contact the pharmacy for the refill. If patient does not wish to contact the pharmacy document the reason why and proceed with request.) (Agent: If yes, when and what did the pharmacy advise?)  This is the patient's preferred pharmacy:  [id number] [address] [facility] - [address] [place] [address] Phone: [phone] Fax: [phone]  Is this the correct pharmacy for this prescription? Yes If no, delete pharmacy and type the correct one.   Has the prescription been filled recently? Yes  Is the patient out of the medication? No  Has the patient been seen for an appointment in the last year OR does the patient have an upcoming appointment? Yes  Can we respond through MyChart? No, please call [phone]  Agent: Please be advised that Rx refills may take up to 3 business days. We ask that you follow-up with your pharmacy.

## 2024-04-02 MED ORDER — RIVAROXABAN 20 MG PO TABS: ORAL_TABLET | ORAL | 1 refills | Status: AC

## 2024-04-13 NOTE — Telephone Encounter (Signed)
 Patient has received her vaccine from a pharmacy.  Have updated pt's chart via [facility] Query.

## 2024-04-18 ENCOUNTER — Other Ambulatory Visit: Payer: Self-pay | Admitting: Internal Medicine

## 2024-04-18 DIAGNOSIS — N3941 Urge incontinence: Secondary | ICD-10-CM

## 2024-04-20 ENCOUNTER — Other Ambulatory Visit: Payer: Self-pay | Admitting: Internal Medicine

## 2024-04-21 ENCOUNTER — Other Ambulatory Visit: Payer: Self-pay | Admitting: Internal Medicine

## 2024-04-23 ENCOUNTER — Other Ambulatory Visit: Payer: Self-pay | Admitting: Internal Medicine

## 2024-06-04 ENCOUNTER — Ambulatory Visit: Admitting: Internal Medicine

## 2024-12-15 ENCOUNTER — Ambulatory Visit
